# Patient Record
Sex: Female | Born: 1935
Health system: Southern US, Community
[De-identification: ages and names within clinical notes are randomized; demographics above are authoritative.]

## PROBLEM LIST (undated history)

## (undated) DIAGNOSIS — C539 Malignant neoplasm of cervix uteri, unspecified: Secondary | ICD-10-CM

## (undated) DIAGNOSIS — I10 Essential (primary) hypertension: Secondary | ICD-10-CM

## (undated) DIAGNOSIS — S4292XA Fracture of left shoulder girdle, part unspecified, initial encounter for closed fracture: Secondary | ICD-10-CM

## (undated) DIAGNOSIS — H409 Unspecified glaucoma: Secondary | ICD-10-CM

## (undated) DIAGNOSIS — C50919 Malignant neoplasm of unspecified site of unspecified female breast: Secondary | ICD-10-CM

## (undated) DIAGNOSIS — A0472 Enterocolitis due to Clostridium difficile, not specified as recurrent: Secondary | ICD-10-CM

## (undated) DIAGNOSIS — E079 Disorder of thyroid, unspecified: Secondary | ICD-10-CM

## (undated) DIAGNOSIS — M359 Systemic involvement of connective tissue, unspecified: Secondary | ICD-10-CM

## (undated) DIAGNOSIS — M810 Age-related osteoporosis without current pathological fracture: Secondary | ICD-10-CM

## (undated) DIAGNOSIS — S42309A Unspecified fracture of shaft of humerus, unspecified arm, initial encounter for closed fracture: Secondary | ICD-10-CM

## (undated) DIAGNOSIS — K2270 Barrett's esophagus without dysplasia: Secondary | ICD-10-CM

## (undated) DIAGNOSIS — M899 Disorder of bone, unspecified: Secondary | ICD-10-CM

## (undated) DIAGNOSIS — N1831 Chronic kidney disease, stage 3a: Secondary | ICD-10-CM

## (undated) DIAGNOSIS — G709 Myoneural disorder, unspecified: Secondary | ICD-10-CM

## (undated) DIAGNOSIS — Z923 Personal history of irradiation: Secondary | ICD-10-CM

## (undated) DIAGNOSIS — I629 Nontraumatic intracranial hemorrhage, unspecified: Secondary | ICD-10-CM

## (undated) DIAGNOSIS — D62 Acute posthemorrhagic anemia: Secondary | ICD-10-CM

## (undated) DIAGNOSIS — R1310 Dysphagia, unspecified: Secondary | ICD-10-CM

## (undated) DIAGNOSIS — N179 Acute kidney failure, unspecified: Secondary | ICD-10-CM

## (undated) DIAGNOSIS — M8580 Other specified disorders of bone density and structure, unspecified site: Secondary | ICD-10-CM

## (undated) DIAGNOSIS — S52599A Other fractures of lower end of unspecified radius, initial encounter for closed fracture: Secondary | ICD-10-CM

## (undated) DIAGNOSIS — E039 Hypothyroidism, unspecified: Secondary | ICD-10-CM

## (undated) DIAGNOSIS — Z9221 Personal history of antineoplastic chemotherapy: Secondary | ICD-10-CM

## (undated) DIAGNOSIS — E785 Hyperlipidemia, unspecified: Secondary | ICD-10-CM

## (undated) DIAGNOSIS — J189 Pneumonia, unspecified organism: Secondary | ICD-10-CM

## (undated) HISTORY — DX: Hyperlipidemia, unspecified: E78.5

## (undated) HISTORY — DX: Acute kidney failure, unspecified: N17.9

## (undated) HISTORY — DX: Other specified disorders of bone density and structure, unspecified site: M85.80

## (undated) HISTORY — DX: Systemic involvement of connective tissue, unspecified: M35.9

## (undated) HISTORY — DX: Fracture of left shoulder girdle, part unspecified, initial encounter for closed fracture: S42.92XA

## (undated) HISTORY — DX: Chronic kidney disease, stage 3a: N18.31

## (undated) HISTORY — DX: Other fractures of lower end of unspecified radius, initial encounter for closed fracture: S52.599A

## (undated) HISTORY — DX: Essential (primary) hypertension: I10

## (undated) HISTORY — DX: Myoneural disorder, unspecified: G70.9

## (undated) HISTORY — DX: Nontraumatic intracranial hemorrhage, unspecified: I62.9

## (undated) HISTORY — DX: Barrett's esophagus without dysplasia: K22.70

## (undated) HISTORY — DX: Hypercalcemia: E83.52

## (undated) HISTORY — PX: APPENDECTOMY: SHX54

## (undated) HISTORY — DX: Age-related osteoporosis without current pathological fracture: M81.0

## (undated) HISTORY — DX: Acute posthemorrhagic anemia: D62

## (undated) HISTORY — PX: EYE SURGERY: SHX253

## (undated) HISTORY — DX: Disorder of thyroid, unspecified: E07.9

## (undated) HISTORY — DX: Disorder of bone, unspecified: M89.9

## (undated) HISTORY — DX: Malignant neoplasm of cervix uteri, unspecified: C53.9

## (undated) HISTORY — DX: Pneumonia, unspecified organism: J18.9

## (undated) HISTORY — DX: Unspecified fracture of shaft of humerus, unspecified arm, initial encounter for closed fracture: S42.309A

## (undated) HISTORY — DX: Unspecified glaucoma: H40.9

## (undated) HISTORY — PX: HAND SURGERY: SHX662

## (undated) HISTORY — DX: Dysphagia, unspecified: R13.10

## (undated) HISTORY — PX: OTHER SURGICAL HISTORY: SHX169

## (undated) HISTORY — DX: Malignant neoplasm of unspecified site of unspecified female breast: C50.919

---
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
## 1970-10-13 HISTORY — PX: ABDOMINAL HYSTERECTOMY: SHX81

## 1976-10-13 HISTORY — PX: OVARIAN CYST REMOVAL: SHX89

## 1998-08-16 ENCOUNTER — Other Ambulatory Visit: Admission: RE | Admit: 1998-08-16 | Discharge: 1998-08-16 | Payer: Self-pay | Admitting: Internal Medicine

## 2000-02-11 ENCOUNTER — Encounter: Admission: RE | Admit: 2000-02-11 | Discharge: 2000-02-11 | Payer: Self-pay | Admitting: Internal Medicine

## 2000-02-11 ENCOUNTER — Encounter: Payer: Self-pay | Admitting: Internal Medicine

## 2000-08-28 ENCOUNTER — Encounter: Payer: Self-pay | Admitting: Emergency Medicine

## 2000-08-28 ENCOUNTER — Encounter: Admission: RE | Admit: 2000-08-28 | Discharge: 2000-08-28 | Payer: Self-pay | Admitting: Emergency Medicine

## 2000-11-20 ENCOUNTER — Encounter (INDEPENDENT_AMBULATORY_CARE_PROVIDER_SITE_OTHER): Payer: Self-pay

## 2000-11-20 ENCOUNTER — Ambulatory Visit (HOSPITAL_COMMUNITY): Admission: RE | Admit: 2000-11-20 | Discharge: 2000-11-20 | Payer: Self-pay | Admitting: *Deleted

## 2001-02-15 ENCOUNTER — Encounter: Admission: RE | Admit: 2001-02-15 | Discharge: 2001-02-15 | Payer: Self-pay | Admitting: Emergency Medicine

## 2001-02-15 ENCOUNTER — Encounter: Payer: Self-pay | Admitting: Emergency Medicine

## 2002-02-17 ENCOUNTER — Encounter: Admission: RE | Admit: 2002-02-17 | Discharge: 2002-02-17 | Payer: Self-pay | Admitting: Emergency Medicine

## 2002-02-17 ENCOUNTER — Encounter: Payer: Self-pay | Admitting: Emergency Medicine

## 2003-01-16 ENCOUNTER — Encounter (INDEPENDENT_AMBULATORY_CARE_PROVIDER_SITE_OTHER): Payer: Self-pay | Admitting: *Deleted

## 2003-01-16 ENCOUNTER — Ambulatory Visit (HOSPITAL_COMMUNITY): Admission: RE | Admit: 2003-01-16 | Discharge: 2003-01-16 | Payer: Self-pay | Admitting: *Deleted

## 2003-02-21 ENCOUNTER — Encounter: Admission: RE | Admit: 2003-02-21 | Discharge: 2003-02-21 | Payer: Self-pay | Admitting: Emergency Medicine

## 2003-02-21 ENCOUNTER — Encounter: Payer: Self-pay | Admitting: Emergency Medicine

## 2004-05-29 ENCOUNTER — Encounter: Admission: RE | Admit: 2004-05-29 | Discharge: 2004-05-29 | Payer: Self-pay | Admitting: Emergency Medicine

## 2005-06-04 ENCOUNTER — Encounter: Admission: RE | Admit: 2005-06-04 | Discharge: 2005-06-04 | Payer: Self-pay | Admitting: Emergency Medicine

## 2006-06-08 ENCOUNTER — Encounter: Admission: RE | Admit: 2006-06-08 | Discharge: 2006-06-08 | Payer: Self-pay | Admitting: Emergency Medicine

## 2007-04-21 ENCOUNTER — Ambulatory Visit: Payer: Self-pay | Admitting: Vascular Surgery

## 2007-06-10 ENCOUNTER — Encounter: Admission: RE | Admit: 2007-06-10 | Discharge: 2007-06-10 | Payer: Self-pay | Admitting: Emergency Medicine

## 2008-06-20 ENCOUNTER — Encounter: Admission: RE | Admit: 2008-06-20 | Discharge: 2008-06-20 | Payer: Self-pay | Admitting: Emergency Medicine

## 2008-07-04 ENCOUNTER — Encounter (INDEPENDENT_AMBULATORY_CARE_PROVIDER_SITE_OTHER): Payer: Self-pay | Admitting: Gastroenterology

## 2008-07-04 ENCOUNTER — Ambulatory Visit (HOSPITAL_COMMUNITY): Admission: RE | Admit: 2008-07-04 | Discharge: 2008-07-04 | Payer: Self-pay | Admitting: Gastroenterology

## 2008-08-10 ENCOUNTER — Encounter: Admission: RE | Admit: 2008-08-10 | Discharge: 2008-08-10 | Payer: Self-pay | Admitting: Emergency Medicine

## 2008-10-13 DIAGNOSIS — C50919 Malignant neoplasm of unspecified site of unspecified female breast: Secondary | ICD-10-CM

## 2008-10-13 HISTORY — PX: BREAST LUMPECTOMY: SHX2

## 2008-10-13 HISTORY — DX: Malignant neoplasm of unspecified site of unspecified female breast: C50.919

## 2008-12-10 ENCOUNTER — Emergency Department (HOSPITAL_BASED_OUTPATIENT_CLINIC_OR_DEPARTMENT_OTHER): Admission: EM | Admit: 2008-12-10 | Discharge: 2008-12-10 | Payer: Self-pay | Admitting: Emergency Medicine

## 2008-12-10 ENCOUNTER — Ambulatory Visit: Payer: Self-pay | Admitting: Diagnostic Radiology

## 2009-01-15 ENCOUNTER — Ambulatory Visit (HOSPITAL_COMMUNITY): Admission: RE | Admit: 2009-01-15 | Discharge: 2009-01-15 | Payer: Self-pay | Admitting: Gastroenterology

## 2009-03-05 ENCOUNTER — Encounter: Admission: RE | Admit: 2009-03-05 | Discharge: 2009-06-03 | Payer: Self-pay | Admitting: Student

## 2009-05-07 ENCOUNTER — Encounter: Admission: RE | Admit: 2009-05-07 | Discharge: 2009-05-07 | Payer: Self-pay | Admitting: Emergency Medicine

## 2009-07-19 ENCOUNTER — Encounter: Admission: RE | Admit: 2009-07-19 | Discharge: 2009-07-19 | Payer: Self-pay | Admitting: Emergency Medicine

## 2009-07-27 ENCOUNTER — Encounter: Admission: RE | Admit: 2009-07-27 | Discharge: 2009-07-27 | Payer: Self-pay | Admitting: Emergency Medicine

## 2009-08-07 ENCOUNTER — Encounter: Admission: RE | Admit: 2009-08-07 | Discharge: 2009-08-07 | Payer: Self-pay | Admitting: Emergency Medicine

## 2009-08-08 ENCOUNTER — Encounter: Admission: RE | Admit: 2009-08-08 | Discharge: 2009-08-08 | Payer: Self-pay | Admitting: General Surgery

## 2009-08-10 ENCOUNTER — Ambulatory Visit: Payer: Self-pay | Admitting: Oncology

## 2009-08-15 LAB — CBC WITH DIFFERENTIAL/PLATELET
BASO%: 0.2 % (ref 0.0–2.0)
Basophils Absolute: 0 10*3/uL (ref 0.0–0.1)
EOS%: 2.2 % (ref 0.0–7.0)
Eosinophils Absolute: 0.2 10*3/uL (ref 0.0–0.5)
HCT: 37.2 % (ref 34.8–46.6)
HGB: 12.1 g/dL (ref 11.6–15.9)
LYMPH%: 21.5 % (ref 14.0–49.7)
MCH: 29.2 pg (ref 25.1–34.0)
MCHC: 32.5 g/dL (ref 31.5–36.0)
MCV: 89.8 fL (ref 79.5–101.0)
MONO#: 0.5 10*3/uL (ref 0.1–0.9)
MONO%: 6.6 % (ref 0.0–14.0)
NEUT#: 5.6 10*3/uL (ref 1.5–6.5)
NEUT%: 69.5 % (ref 38.4–76.8)
Platelets: 254 10*3/uL (ref 145–400)
RBC: 4.15 10*6/uL (ref 3.70–5.45)
RDW: 16 % — ABNORMAL HIGH (ref 11.2–14.5)
WBC: 8.1 10*3/uL (ref 3.9–10.3)
lymph#: 1.7 10*3/uL (ref 0.9–3.3)

## 2009-08-15 LAB — COMPREHENSIVE METABOLIC PANEL
ALT: 20 U/L (ref 0–35)
AST: 23 U/L (ref 0–37)
Albumin: 4.1 g/dL (ref 3.5–5.2)
Alkaline Phosphatase: 111 U/L (ref 39–117)
BUN: 21 mg/dL (ref 6–23)
CO2: 28 mEq/L (ref 19–32)
Calcium: 10.3 mg/dL (ref 8.4–10.5)
Chloride: 103 mEq/L (ref 96–112)
Creatinine, Ser: 0.95 mg/dL (ref 0.40–1.20)
Glucose, Bld: 89 mg/dL (ref 70–99)
Potassium: 5 mEq/L (ref 3.5–5.3)
Sodium: 141 mEq/L (ref 135–145)
Total Bilirubin: 0.4 mg/dL (ref 0.3–1.2)
Total Protein: 7 g/dL (ref 6.0–8.3)

## 2009-08-15 LAB — CANCER ANTIGEN 27.29: CA 27.29: 28 U/mL (ref 0–39)

## 2009-08-20 ENCOUNTER — Ambulatory Visit: Admission: RE | Admit: 2009-08-20 | Discharge: 2009-10-12 | Payer: Self-pay | Admitting: Radiation Oncology

## 2009-08-27 ENCOUNTER — Encounter: Admission: RE | Admit: 2009-08-27 | Discharge: 2009-08-27 | Payer: Self-pay | Admitting: General Surgery

## 2009-08-27 ENCOUNTER — Ambulatory Visit (HOSPITAL_BASED_OUTPATIENT_CLINIC_OR_DEPARTMENT_OTHER): Admission: RE | Admit: 2009-08-27 | Discharge: 2009-08-27 | Payer: Self-pay | Admitting: General Surgery

## 2009-08-27 ENCOUNTER — Encounter (INDEPENDENT_AMBULATORY_CARE_PROVIDER_SITE_OTHER): Payer: Self-pay | Admitting: General Surgery

## 2009-08-30 ENCOUNTER — Encounter: Admission: RE | Admit: 2009-08-30 | Discharge: 2009-09-04 | Payer: Self-pay | Admitting: Radiation Oncology

## 2009-10-15 ENCOUNTER — Ambulatory Visit: Admission: RE | Admit: 2009-10-15 | Discharge: 2009-11-07 | Payer: Self-pay | Admitting: Radiation Oncology

## 2009-11-13 ENCOUNTER — Ambulatory Visit (HOSPITAL_COMMUNITY): Admission: RE | Admit: 2009-11-13 | Discharge: 2009-11-13 | Payer: Self-pay | Admitting: Oncology

## 2009-11-19 ENCOUNTER — Ambulatory Visit (HOSPITAL_COMMUNITY): Admission: RE | Admit: 2009-11-19 | Discharge: 2009-11-19 | Payer: Self-pay | Admitting: Oncology

## 2009-11-21 ENCOUNTER — Ambulatory Visit: Payer: Self-pay | Admitting: Oncology

## 2009-11-21 LAB — COMPREHENSIVE METABOLIC PANEL
ALT: 14 U/L (ref 0–35)
AST: 23 U/L (ref 0–37)
Albumin: 3.7 g/dL (ref 3.5–5.2)
Alkaline Phosphatase: 68 U/L (ref 39–117)
BUN: 17 mg/dL (ref 6–23)
CO2: 29 mEq/L (ref 19–32)
Calcium: 9.6 mg/dL (ref 8.4–10.5)
Chloride: 109 mEq/L (ref 96–112)
Creatinine, Ser: 0.93 mg/dL (ref 0.40–1.20)
Glucose, Bld: 97 mg/dL (ref 70–99)
Potassium: 3.8 mEq/L (ref 3.5–5.3)
Sodium: 143 mEq/L (ref 135–145)
Total Bilirubin: 0.6 mg/dL (ref 0.3–1.2)
Total Protein: 6.7 g/dL (ref 6.0–8.3)

## 2009-11-21 LAB — CBC WITH DIFFERENTIAL/PLATELET
BASO%: 0.5 % (ref 0.0–2.0)
Basophils Absolute: 0 10*3/uL (ref 0.0–0.1)
EOS%: 2.8 % (ref 0.0–7.0)
Eosinophils Absolute: 0.2 10*3/uL (ref 0.0–0.5)
HCT: 37.8 % (ref 34.8–46.6)
HGB: 12.6 g/dL (ref 11.6–15.9)
LYMPH%: 14.3 % (ref 14.0–49.7)
MCH: 30.1 pg (ref 25.1–34.0)
MCHC: 33.3 g/dL (ref 31.5–36.0)
MCV: 90.5 fL (ref 79.5–101.0)
MONO#: 0.6 10*3/uL (ref 0.1–0.9)
MONO%: 8.4 % (ref 0.0–14.0)
NEUT#: 5.2 10*3/uL (ref 1.5–6.5)
NEUT%: 74 % (ref 38.4–76.8)
Platelets: 213 10*3/uL (ref 145–400)
RBC: 4.18 10*6/uL (ref 3.70–5.45)
RDW: 14.9 % — ABNORMAL HIGH (ref 11.2–14.5)
WBC: 7 10*3/uL (ref 3.9–10.3)
lymph#: 1 10*3/uL (ref 0.9–3.3)

## 2009-11-21 LAB — CANCER ANTIGEN 27.29: CA 27.29: 19 U/mL (ref 0–39)

## 2009-11-21 LAB — LACTATE DEHYDROGENASE: LDH: 145 U/L (ref 94–250)

## 2009-11-29 ENCOUNTER — Ambulatory Visit (HOSPITAL_COMMUNITY): Admission: RE | Admit: 2009-11-29 | Discharge: 2009-11-29 | Payer: Self-pay | Admitting: Oncology

## 2010-01-29 ENCOUNTER — Ambulatory Visit: Payer: Self-pay | Admitting: Oncology

## 2010-01-31 LAB — COMPREHENSIVE METABOLIC PANEL
ALT: 8 U/L (ref 0–35)
AST: 19 U/L (ref 0–37)
Albumin: 3.9 g/dL (ref 3.5–5.2)
Alkaline Phosphatase: 66 U/L (ref 39–117)
BUN: 21 mg/dL (ref 6–23)
CO2: 27 mEq/L (ref 19–32)
Calcium: 9.8 mg/dL (ref 8.4–10.5)
Chloride: 104 mEq/L (ref 96–112)
Creatinine, Ser: 1.15 mg/dL (ref 0.40–1.20)
Glucose, Bld: 86 mg/dL (ref 70–99)
Potassium: 4.4 mEq/L (ref 3.5–5.3)
Sodium: 141 mEq/L (ref 135–145)
Total Bilirubin: 0.4 mg/dL (ref 0.3–1.2)
Total Protein: 6.4 g/dL (ref 6.0–8.3)

## 2010-01-31 LAB — CBC WITH DIFFERENTIAL/PLATELET
BASO%: 0.4 % (ref 0.0–2.0)
Basophils Absolute: 0 10*3/uL (ref 0.0–0.1)
EOS%: 2.1 % (ref 0.0–7.0)
Eosinophils Absolute: 0.1 10*3/uL (ref 0.0–0.5)
HCT: 34.8 % (ref 34.8–46.6)
HGB: 11.7 g/dL (ref 11.6–15.9)
LYMPH%: 16.7 % (ref 14.0–49.7)
MCH: 30.5 pg (ref 25.1–34.0)
MCHC: 33.7 g/dL (ref 31.5–36.0)
MCV: 90.4 fL (ref 79.5–101.0)
MONO#: 0.5 10*3/uL (ref 0.1–0.9)
MONO%: 8.2 % (ref 0.0–14.0)
NEUT#: 4.6 10*3/uL (ref 1.5–6.5)
NEUT%: 72.6 % (ref 38.4–76.8)
Platelets: 206 10*3/uL (ref 145–400)
RBC: 3.84 10*6/uL (ref 3.70–5.45)
RDW: 15.2 % — ABNORMAL HIGH (ref 11.2–14.5)
WBC: 6.4 10*3/uL (ref 3.9–10.3)
lymph#: 1.1 10*3/uL (ref 0.9–3.3)

## 2010-02-01 ENCOUNTER — Ambulatory Visit (HOSPITAL_COMMUNITY): Admission: RE | Admit: 2010-02-01 | Discharge: 2010-02-01 | Payer: Self-pay | Admitting: Oncology

## 2010-02-13 ENCOUNTER — Ambulatory Visit (HOSPITAL_COMMUNITY): Admission: RE | Admit: 2010-02-13 | Discharge: 2010-02-13 | Payer: Self-pay | Admitting: Oncology

## 2010-03-29 ENCOUNTER — Ambulatory Visit: Payer: Self-pay | Admitting: Oncology

## 2010-03-29 LAB — CBC WITH DIFFERENTIAL/PLATELET
BASO%: 0.3 % (ref 0.0–2.0)
Basophils Absolute: 0 10*3/uL (ref 0.0–0.1)
EOS%: 1.4 % (ref 0.0–7.0)
Eosinophils Absolute: 0.1 10*3/uL (ref 0.0–0.5)
HCT: 32.6 % — ABNORMAL LOW (ref 34.8–46.6)
HGB: 11.1 g/dL — ABNORMAL LOW (ref 11.6–15.9)
LYMPH%: 18.1 % (ref 14.0–49.7)
MCH: 30.2 pg (ref 25.1–34.0)
MCHC: 34.1 g/dL (ref 31.5–36.0)
MCV: 88.7 fL (ref 79.5–101.0)
MONO#: 0.4 10*3/uL (ref 0.1–0.9)
MONO%: 6.8 % (ref 0.0–14.0)
NEUT#: 4.4 10*3/uL (ref 1.5–6.5)
NEUT%: 73.4 % (ref 38.4–76.8)
Platelets: 197 10*3/uL (ref 145–400)
RBC: 3.68 10*6/uL — ABNORMAL LOW (ref 3.70–5.45)
RDW: 15.3 % — ABNORMAL HIGH (ref 11.2–14.5)
WBC: 5.9 10*3/uL (ref 3.9–10.3)
lymph#: 1.1 10*3/uL (ref 0.9–3.3)

## 2010-03-29 LAB — COMPREHENSIVE METABOLIC PANEL
ALT: 10 U/L (ref 0–35)
AST: 18 U/L (ref 0–37)
Albumin: 3.9 g/dL (ref 3.5–5.2)
Alkaline Phosphatase: 66 U/L (ref 39–117)
BUN: 24 mg/dL — ABNORMAL HIGH (ref 6–23)
CO2: 24 mEq/L (ref 19–32)
Calcium: 9.5 mg/dL (ref 8.4–10.5)
Chloride: 107 mEq/L (ref 96–112)
Creatinine, Ser: 1.06 mg/dL (ref 0.40–1.20)
Glucose, Bld: 95 mg/dL (ref 70–99)
Potassium: 4.3 mEq/L (ref 3.5–5.3)
Sodium: 142 mEq/L (ref 135–145)
Total Bilirubin: 0.3 mg/dL (ref 0.3–1.2)
Total Protein: 6.4 g/dL (ref 6.0–8.3)

## 2010-04-01 ENCOUNTER — Encounter: Admission: RE | Admit: 2010-04-01 | Discharge: 2010-04-01 | Payer: Self-pay | Admitting: Oncology

## 2010-06-03 ENCOUNTER — Ambulatory Visit: Payer: Self-pay | Admitting: Oncology

## 2010-06-05 LAB — COMPREHENSIVE METABOLIC PANEL
ALT: 11 U/L (ref 0–35)
AST: 22 U/L (ref 0–37)
Albumin: 4.1 g/dL (ref 3.5–5.2)
Alkaline Phosphatase: 72 U/L (ref 39–117)
BUN: 18 mg/dL (ref 6–23)
CO2: 23 mEq/L (ref 19–32)
Calcium: 9.6 mg/dL (ref 8.4–10.5)
Chloride: 106 mEq/L (ref 96–112)
Creatinine, Ser: 1.03 mg/dL (ref 0.40–1.20)
Glucose, Bld: 98 mg/dL (ref 70–99)
Potassium: 4.6 mEq/L (ref 3.5–5.3)
Sodium: 140 mEq/L (ref 135–145)
Total Bilirubin: 0.4 mg/dL (ref 0.3–1.2)
Total Protein: 6.6 g/dL (ref 6.0–8.3)

## 2010-06-05 LAB — URINALYSIS, MICROSCOPIC - CHCC
Bilirubin (Urine): NEGATIVE
Blood: NEGATIVE
Glucose: NEGATIVE g/dL
Ketones: NEGATIVE mg/dL
Leukocyte Esterase: NEGATIVE
Nitrite: NEGATIVE
Protein: NEGATIVE mg/dL
Specific Gravity, Urine: 1.01 (ref 1.003–1.035)
WBC, UA: NEGATIVE (ref 0–2)
pH: 5 (ref 4.6–8.0)

## 2010-06-05 LAB — CBC WITH DIFFERENTIAL/PLATELET
BASO%: 0.4 % (ref 0.0–2.0)
Basophils Absolute: 0 10*3/uL (ref 0.0–0.1)
EOS%: 6 % (ref 0.0–7.0)
Eosinophils Absolute: 0.3 10*3/uL (ref 0.0–0.5)
HCT: 37.9 % (ref 34.8–46.6)
HGB: 12.7 g/dL (ref 11.6–15.9)
LYMPH%: 16.9 % (ref 14.0–49.7)
MCH: 29.9 pg (ref 25.1–34.0)
MCHC: 33.6 g/dL (ref 31.5–36.0)
MCV: 89.1 fL (ref 79.5–101.0)
MONO#: 0.4 10*3/uL (ref 0.1–0.9)
MONO%: 7.6 % (ref 0.0–14.0)
NEUT#: 4 10*3/uL (ref 1.5–6.5)
NEUT%: 69.1 % (ref 38.4–76.8)
Platelets: 215 10*3/uL (ref 145–400)
RBC: 4.26 10*6/uL (ref 3.70–5.45)
RDW: 16.3 % — ABNORMAL HIGH (ref 11.2–14.5)
WBC: 5.8 10*3/uL (ref 3.9–10.3)
lymph#: 1 10*3/uL (ref 0.9–3.3)

## 2010-06-06 LAB — URINE CULTURE

## 2010-07-23 ENCOUNTER — Encounter: Admission: RE | Admit: 2010-07-23 | Discharge: 2010-07-23 | Payer: Self-pay | Admitting: Family Medicine

## 2010-08-02 ENCOUNTER — Encounter: Admission: RE | Admit: 2010-08-02 | Discharge: 2010-08-02 | Payer: Self-pay | Admitting: Oncology

## 2010-09-23 ENCOUNTER — Ambulatory Visit: Payer: Self-pay | Admitting: Oncology

## 2010-09-25 LAB — CBC WITH DIFFERENTIAL/PLATELET
BASO%: 0.3 % (ref 0.0–2.0)
Basophils Absolute: 0 10*3/uL (ref 0.0–0.1)
EOS%: 1.9 % (ref 0.0–7.0)
Eosinophils Absolute: 0.2 10*3/uL (ref 0.0–0.5)
HCT: 36.5 % (ref 34.8–46.6)
HGB: 12.2 g/dL (ref 11.6–15.9)
LYMPH%: 18.9 % (ref 14.0–49.7)
MCH: 29.8 pg (ref 25.1–34.0)
MCHC: 33.3 g/dL (ref 31.5–36.0)
MCV: 89.3 fL (ref 79.5–101.0)
MONO#: 0.8 10*3/uL (ref 0.1–0.9)
MONO%: 9.4 % (ref 0.0–14.0)
NEUT#: 5.8 10*3/uL (ref 1.5–6.5)
NEUT%: 69.5 % (ref 38.4–76.8)
Platelets: 253 10*3/uL (ref 145–400)
RBC: 4.08 10*6/uL (ref 3.70–5.45)
RDW: 16 % — ABNORMAL HIGH (ref 11.2–14.5)
WBC: 8.3 10*3/uL (ref 3.9–10.3)
lymph#: 1.6 10*3/uL (ref 0.9–3.3)

## 2010-09-25 LAB — COMPREHENSIVE METABOLIC PANEL
ALT: 10 U/L (ref 0–35)
AST: 15 U/L (ref 0–37)
Albumin: 3.8 g/dL (ref 3.5–5.2)
Alkaline Phosphatase: 63 U/L (ref 39–117)
BUN: 25 mg/dL — ABNORMAL HIGH (ref 6–23)
CO2: 30 mEq/L (ref 19–32)
Calcium: 10 mg/dL (ref 8.4–10.5)
Chloride: 102 mEq/L (ref 96–112)
Creatinine, Ser: 0.99 mg/dL (ref 0.40–1.20)
Glucose, Bld: 77 mg/dL (ref 70–99)
Potassium: 4.2 mEq/L (ref 3.5–5.3)
Sodium: 138 mEq/L (ref 135–145)
Total Bilirubin: 0.3 mg/dL (ref 0.3–1.2)
Total Protein: 6.3 g/dL (ref 6.0–8.3)

## 2010-09-25 LAB — LACTATE DEHYDROGENASE: LDH: 145 U/L (ref 94–250)

## 2010-11-03 ENCOUNTER — Encounter: Payer: Self-pay | Admitting: Oncology

## 2010-11-04 ENCOUNTER — Encounter: Payer: Self-pay | Admitting: Emergency Medicine

## 2010-12-12 ENCOUNTER — Other Ambulatory Visit: Payer: Self-pay | Admitting: Emergency Medicine

## 2010-12-12 DIAGNOSIS — R252 Cramp and spasm: Secondary | ICD-10-CM

## 2010-12-18 ENCOUNTER — Ambulatory Visit
Admission: RE | Admit: 2010-12-18 | Discharge: 2010-12-18 | Disposition: A | Discharge status: Home / Self Care | Payer: Medicare Other | Source: Ambulatory Visit | Attending: Emergency Medicine | Admitting: Emergency Medicine

## 2010-12-18 DIAGNOSIS — R252 Cramp and spasm: Secondary | ICD-10-CM

## 2010-12-27 ENCOUNTER — Other Ambulatory Visit: Payer: Self-pay | Admitting: Gastroenterology

## 2011-01-01 LAB — PROTIME-INR
INR: 1.06 (ref 0.00–1.49)
Prothrombin Time: 13.7 seconds (ref 11.6–15.2)

## 2011-01-01 LAB — CBC
HCT: 36.5 % (ref 36.0–46.0)
Hemoglobin: 12.6 g/dL (ref 12.0–15.0)
MCHC: 34.5 g/dL (ref 30.0–36.0)
MCV: 89.6 fL (ref 78.0–100.0)
Platelets: 174 10*3/uL (ref 150–400)
RBC: 4.07 MIL/uL (ref 3.87–5.11)
RDW: 14.6 % (ref 11.5–15.5)
WBC: 5.7 10*3/uL (ref 4.0–10.5)

## 2011-01-01 LAB — APTT: aPTT: 32 seconds (ref 24–37)

## 2011-01-15 LAB — COMPREHENSIVE METABOLIC PANEL
ALT: 18 U/L (ref 0–35)
AST: 24 U/L (ref 0–37)
Albumin: 3.8 g/dL (ref 3.5–5.2)
Alkaline Phosphatase: 85 U/L (ref 39–117)
BUN: 18 mg/dL (ref 6–23)
CO2: 28 mEq/L (ref 19–32)
Calcium: 9.5 mg/dL (ref 8.4–10.5)
Chloride: 103 mEq/L (ref 96–112)
Creatinine, Ser: 1.06 mg/dL (ref 0.4–1.2)
GFR calc Af Amer: >60 mL/min (ref >60)
GFR calc non Af Amer: 50 mL/min — ABNORMAL LOW (ref >60)
Glucose, Bld: 150 mg/dL — ABNORMAL HIGH (ref 70–99)
Potassium: 4.9 mEq/L (ref 3.5–5.1)
Sodium: 135 mEq/L (ref 135–145)
Total Bilirubin: 0.5 mg/dL (ref 0.3–1.2)
Total Protein: 7.1 g/dL (ref 6.0–8.3)

## 2011-01-15 LAB — CBC
HCT: 35.8 % — ABNORMAL LOW (ref 36.0–46.0)
Hemoglobin: 12 g/dL (ref 12.0–15.0)
MCHC: 33.6 g/dL (ref 30.0–36.0)
MCV: 90.4 fL (ref 78.0–100.0)
Platelets: 227 10*3/uL (ref 150–400)
RBC: 3.96 MIL/uL (ref 3.87–5.11)
RDW: 16.7 % — ABNORMAL HIGH (ref 11.5–15.5)
WBC: 7.4 10*3/uL (ref 4.0–10.5)

## 2011-01-15 LAB — DIFFERENTIAL
Basophils Absolute: 0 10*3/uL (ref 0.0–0.1)
Basophils Relative: 0 % (ref 0–1)
Eosinophils Absolute: 0.1 10*3/uL (ref 0.0–0.7)
Eosinophils Relative: 2 % (ref 0–5)
Lymphocytes Relative: 26 % (ref 12–46)
Lymphs Abs: 1.9 10*3/uL (ref 0.7–4.0)
Monocytes Absolute: 0.4 10*3/uL (ref 0.1–1.0)
Monocytes Relative: 6 % (ref 3–12)
Neutro Abs: 4.9 10*3/uL (ref 1.7–7.7)
Neutrophils Relative %: 66 % (ref 43–77)

## 2011-01-15 LAB — LACTATE DEHYDROGENASE: LDH: 186 U/L (ref 94–250)

## 2011-01-15 LAB — CANCER ANTIGEN 27.29: CA 27.29: 27 U/mL (ref 0–39)

## 2011-01-22 ENCOUNTER — Encounter (HOSPITAL_BASED_OUTPATIENT_CLINIC_OR_DEPARTMENT_OTHER): Payer: Medicare Other | Admitting: Oncology

## 2011-01-22 ENCOUNTER — Other Ambulatory Visit: Payer: Self-pay | Admitting: Oncology

## 2011-01-22 DIAGNOSIS — C50419 Malignant neoplasm of upper-outer quadrant of unspecified female breast: Secondary | ICD-10-CM

## 2011-01-22 DIAGNOSIS — M899 Disorder of bone, unspecified: Secondary | ICD-10-CM

## 2011-01-22 DIAGNOSIS — C50919 Malignant neoplasm of unspecified site of unspecified female breast: Secondary | ICD-10-CM

## 2011-01-22 LAB — CBC WITH DIFFERENTIAL/PLATELET
BASO%: 0.4 % (ref 0.0–2.0)
Basophils Absolute: 0 10*3/uL (ref 0.0–0.1)
EOS%: 1.9 % (ref 0.0–7.0)
Eosinophils Absolute: 0.1 10*3/uL (ref 0.0–0.5)
HCT: 35.9 % (ref 34.8–46.6)
HGB: 12.1 g/dL (ref 11.6–15.9)
LYMPH%: 21.6 % (ref 14.0–49.7)
MCH: 29.4 pg (ref 25.1–34.0)
MCHC: 33.6 g/dL (ref 31.5–36.0)
MCV: 87.5 fL (ref 79.5–101.0)
MONO#: 0.5 10*3/uL (ref 0.1–0.9)
MONO%: 8 % (ref 0.0–14.0)
NEUT#: 4 10*3/uL (ref 1.5–6.5)
NEUT%: 68.1 % (ref 38.4–76.8)
Platelets: 212 10*3/uL (ref 145–400)
RBC: 4.11 10*6/uL (ref 3.70–5.45)
RDW: 15.9 % — ABNORMAL HIGH (ref 11.2–14.5)
WBC: 5.9 10*3/uL (ref 3.9–10.3)
lymph#: 1.3 10*3/uL (ref 0.9–3.3)

## 2011-01-22 LAB — COMPREHENSIVE METABOLIC PANEL
ALT: 10 U/L (ref 0–35)
AST: 21 U/L (ref 0–37)
Albumin: 4.1 g/dL (ref 3.5–5.2)
Alkaline Phosphatase: 74 U/L (ref 39–117)
BUN: 20 mg/dL (ref 6–23)
CO2: 26 mEq/L (ref 19–32)
Calcium: 9.7 mg/dL (ref 8.4–10.5)
Chloride: 106 mEq/L (ref 96–112)
Creatinine, Ser: 0.98 mg/dL (ref 0.40–1.20)
Glucose, Bld: 93 mg/dL (ref 70–99)
Potassium: 4.4 mEq/L (ref 3.5–5.3)
Sodium: 140 mEq/L (ref 135–145)
Total Bilirubin: 0.4 mg/dL (ref 0.3–1.2)
Total Protein: 6.5 g/dL (ref 6.0–8.3)

## 2011-01-22 LAB — LACTATE DEHYDROGENASE: LDH: 169 U/L (ref 94–250)

## 2011-01-25 ENCOUNTER — Emergency Department: Payer: Self-pay | Admitting: Internal Medicine

## 2011-02-25 NOTE — Op Note (Signed)
NAMEFIONNA, [name]                 ACCOUNT NO.:  [id number]   MEDICAL RECORD NO.:  [id number]          PATIENT TYPE:  AMB   LOCATION:  ENDO                         FACILITY:  [facility]   PHYSICIAN:  Shirley Friar, MDDATE OF BIRTH:  1931-09-26   DATE OF PROCEDURE:  07/04/2008  DATE OF DISCHARGE:                               OPERATIVE REPORT   PROCEDURE:  Colonoscopy.   REASON FOR COLONOSCOPY:  Screening.   MEDICATIONS:  Fentanyl 50 mcg IV, Versed 4 mg IV.   FINDINGS:  Rectal exam was normal.  A pediatric Pentax colonoscope was  inserted into an adequately prepped colon and advanced to cecum where  the ileocecal valve and appendiceal orifice were identified.  On careful  withdrawal of the colonoscope a 1 cm pedunculated polyp was seen in the  proximal ascending colon that was removed with snare cautery.  Retrieval  of this polyp resulted in the polyp breaking up into pieces on  retrieval.  No immediate complications were seen at the polypectomy  site.  On further withdrawal the colonoscope, no other mucosal  abnormalities were seen.  Retroflexion revealed small internal  hemorrhoids.   ASSESSMENT:  1. Proximal ascending colon polyp removed with snare cautery.  2. Small internal hemorrhoids.   PLAN:  1. Avoid aspirin products for 2 weeks.  2. Follow-up on path.      Shirley Friar, MD  Electronically Signed     VCS/MEDQ  D:  07/04/2008  T:  07/05/2008  Job:  [id number]   cc:   Brett Canales A. Cleta Alberts, M.D.  Fax: [phone]

## 2011-02-25 NOTE — Procedures (Signed)
CAROTID DUPLEX EXAM   INDICATION:  Left arm and face paresthesia.   HISTORY:  Diabetes:  No  Cardiac:  No  Hypertension:  No  Smoking:  No  Previous Surgery:  No  CV History:  Episode of left arm and facial numbness for approximately 1  month.  The tingling sensation is almost constant.  Amaurosis Fugax No, Paresthesias Yes, Hemiparesis No.                                        RIGHT             LEFT  Brachial systolic pressure:         156               160  Brachial Doppler waveforms:         Triphasic         Triphasic  Vertebral direction of flow:        Antegrade         Antegrade  DUPLEX VELOCITIES (cm/sec)  CCA peak systolic                   57                61  ECA peak systolic                   61                65  ICA peak systolic                   70                76  ICA end diastolic                   22                26  PLAQUE MORPHOLOGY:                  Calcified         Mixed  PLAQUE AMOUNT:                      Mild to moderate  Mild  PLAQUE LOCATION:                    Proximal ICA      Proximal ICA   IMPRESSION:  A 20-39% internal carotid artery stenoses bilaterally.   .   ___________________________________________  Di Kindle Edilia Bo, M.D.   MC/MEDQ  D:  04/22/2007  T:  04/22/2007  Job:  [id number]

## 2011-03-06 ENCOUNTER — Encounter (INDEPENDENT_AMBULATORY_CARE_PROVIDER_SITE_OTHER): Payer: Self-pay | Admitting: General Surgery

## 2011-06-11 ENCOUNTER — Other Ambulatory Visit: Payer: Self-pay | Admitting: Oncology

## 2011-06-11 ENCOUNTER — Encounter (HOSPITAL_BASED_OUTPATIENT_CLINIC_OR_DEPARTMENT_OTHER): Payer: MEDICARE | Admitting: Oncology

## 2011-06-11 DIAGNOSIS — C50919 Malignant neoplasm of unspecified site of unspecified female breast: Secondary | ICD-10-CM

## 2011-06-11 DIAGNOSIS — M949 Disorder of cartilage, unspecified: Secondary | ICD-10-CM

## 2011-06-11 DIAGNOSIS — C50419 Malignant neoplasm of upper-outer quadrant of unspecified female breast: Secondary | ICD-10-CM

## 2011-06-11 DIAGNOSIS — M899 Disorder of bone, unspecified: Secondary | ICD-10-CM

## 2011-06-11 LAB — CBC WITH DIFFERENTIAL/PLATELET
BASO%: 0.3 % (ref 0.0–2.0)
Basophils Absolute: 0 10*3/uL (ref 0.0–0.1)
EOS%: 1.5 % (ref 0.0–7.0)
Eosinophils Absolute: 0.1 10*3/uL (ref 0.0–0.5)
HCT: 33.4 % — ABNORMAL LOW (ref 34.8–46.6)
HGB: 11.2 g/dL — ABNORMAL LOW (ref 11.6–15.9)
LYMPH%: 14.5 % (ref 14.0–49.7)
MCH: 29.2 pg (ref 25.1–34.0)
MCHC: 33.5 g/dL (ref 31.5–36.0)
MCV: 87.3 fL (ref 79.5–101.0)
MONO#: 0.6 10*3/uL (ref 0.1–0.9)
MONO%: 7.7 % (ref 0.0–14.0)
NEUT#: 5.8 10*3/uL (ref 1.5–6.5)
NEUT%: 76 % (ref 38.4–76.8)
Platelets: 206 10*3/uL (ref 145–400)
RBC: 3.83 10*6/uL (ref 3.70–5.45)
RDW: 16.4 % — ABNORMAL HIGH (ref 11.2–14.5)
WBC: 7.6 10*3/uL (ref 3.9–10.3)
lymph#: 1.1 10*3/uL (ref 0.9–3.3)

## 2011-06-11 LAB — COMPREHENSIVE METABOLIC PANEL
ALT: 9 U/L (ref 0–35)
AST: 17 U/L (ref 0–37)
Albumin: 3.9 g/dL (ref 3.5–5.2)
Alkaline Phosphatase: 74 U/L (ref 39–117)
BUN: 23 mg/dL (ref 6–23)
CO2: 25 mEq/L (ref 19–32)
Calcium: 9.4 mg/dL (ref 8.4–10.5)
Chloride: 103 mEq/L (ref 96–112)
Creatinine, Ser: 1.26 mg/dL — ABNORMAL HIGH (ref 0.50–1.10)
Glucose, Bld: 66 mg/dL — ABNORMAL LOW (ref 70–99)
Potassium: 4.7 mEq/L (ref 3.5–5.3)
Sodium: 139 mEq/L (ref 135–145)
Total Bilirubin: 0.3 mg/dL (ref 0.3–1.2)
Total Protein: 6.3 g/dL (ref 6.0–8.3)

## 2011-06-30 ENCOUNTER — Other Ambulatory Visit: Payer: Self-pay | Admitting: Oncology

## 2011-06-30 DIAGNOSIS — Z853 Personal history of malignant neoplasm of breast: Secondary | ICD-10-CM

## 2011-07-09 LAB — CBC AND DIFFERENTIAL
HCT: 38 % (ref 36–46)
Hemoglobin: 11.9 g/dL — AB (ref 12.0–16.0)
Platelets: 242 10*3/uL (ref 150–399)

## 2011-07-09 LAB — BASIC METABOLIC PANEL
Glucose: 47 mg/dL
Sodium: 140 mmol/L (ref 137–147)

## 2011-07-09 LAB — LIPID PANEL
Cholesterol: 171 mg/dL (ref 0–200)
HDL: 71 mg/dL — AB (ref 35–70)
LDL Cholesterol: 87 mg/dL
LDl/HDL Ratio: 2.4
Triglycerides: 65 mg/dL (ref 40–160)

## 2011-08-04 ENCOUNTER — Ambulatory Visit
Admission: RE | Admit: 2011-08-04 | Discharge: 2011-08-04 | Disposition: A | Discharge status: Home / Self Care | Payer: Medicare Other | Source: Ambulatory Visit | Attending: Oncology | Admitting: Oncology

## 2011-08-04 DIAGNOSIS — Z853 Personal history of malignant neoplasm of breast: Secondary | ICD-10-CM

## 2011-08-04 LAB — HM MAMMOGRAPHY

## 2011-08-19 ENCOUNTER — Other Ambulatory Visit: Payer: Self-pay | Admitting: Dermatology

## 2011-10-12 ENCOUNTER — Encounter: Payer: Self-pay | Admitting: Oncology

## 2011-10-12 DIAGNOSIS — C50919 Malignant neoplasm of unspecified site of unspecified female breast: Secondary | ICD-10-CM | POA: Insufficient documentation

## 2011-10-12 DIAGNOSIS — E785 Hyperlipidemia, unspecified: Secondary | ICD-10-CM | POA: Insufficient documentation

## 2011-10-16 ENCOUNTER — Encounter: Payer: Self-pay | Admitting: *Deleted

## 2011-10-18 ENCOUNTER — Ambulatory Visit (INDEPENDENT_AMBULATORY_CARE_PROVIDER_SITE_OTHER): Payer: Medicare Other

## 2011-10-18 DIAGNOSIS — R05 Cough: Secondary | ICD-10-CM

## 2011-10-18 DIAGNOSIS — R059 Cough, unspecified: Secondary | ICD-10-CM

## 2011-10-18 DIAGNOSIS — J029 Acute pharyngitis, unspecified: Secondary | ICD-10-CM

## 2011-10-20 ENCOUNTER — Other Ambulatory Visit: Payer: Self-pay | Admitting: Oncology

## 2011-10-20 ENCOUNTER — Telehealth: Payer: Self-pay | Admitting: Oncology

## 2011-10-20 ENCOUNTER — Ambulatory Visit (HOSPITAL_BASED_OUTPATIENT_CLINIC_OR_DEPARTMENT_OTHER): Payer: Medicare Other | Admitting: Oncology

## 2011-10-20 ENCOUNTER — Other Ambulatory Visit (HOSPITAL_BASED_OUTPATIENT_CLINIC_OR_DEPARTMENT_OTHER): Payer: Medicare Other | Admitting: Lab

## 2011-10-20 DIAGNOSIS — M8580 Other specified disorders of bone density and structure, unspecified site: Secondary | ICD-10-CM

## 2011-10-20 DIAGNOSIS — C50419 Malignant neoplasm of upper-outer quadrant of unspecified female breast: Secondary | ICD-10-CM

## 2011-10-20 DIAGNOSIS — C50919 Malignant neoplasm of unspecified site of unspecified female breast: Secondary | ICD-10-CM

## 2011-10-20 DIAGNOSIS — Z79811 Long term (current) use of aromatase inhibitors: Secondary | ICD-10-CM

## 2011-10-20 DIAGNOSIS — M899 Disorder of bone, unspecified: Secondary | ICD-10-CM

## 2011-10-20 DIAGNOSIS — M949 Disorder of cartilage, unspecified: Secondary | ICD-10-CM

## 2011-10-20 DIAGNOSIS — E785 Hyperlipidemia, unspecified: Secondary | ICD-10-CM

## 2011-10-20 DIAGNOSIS — R599 Enlarged lymph nodes, unspecified: Secondary | ICD-10-CM

## 2011-10-20 LAB — CBC WITH DIFFERENTIAL/PLATELET
BASO%: 0.2 % (ref 0.0–2.0)
Basophils Absolute: 0 10*3/uL (ref 0.0–0.1)
EOS%: 1.9 % (ref 0.0–7.0)
Eosinophils Absolute: 0.2 10*3/uL (ref 0.0–0.5)
HCT: 33.4 % — ABNORMAL LOW (ref 34.8–46.6)
HGB: 11.2 g/dL — ABNORMAL LOW (ref 11.6–15.9)
LYMPH%: 6.7 % — ABNORMAL LOW (ref 14.0–49.7)
MCH: 29.5 pg (ref 25.1–34.0)
MCHC: 33.7 g/dL (ref 31.5–36.0)
MCV: 87.7 fL (ref 79.5–101.0)
MONO#: 0.7 10*3/uL (ref 0.1–0.9)
MONO%: 7.6 % (ref 0.0–14.0)
NEUT#: 8.2 10*3/uL — ABNORMAL HIGH (ref 1.5–6.5)
NEUT%: 83.6 % — ABNORMAL HIGH (ref 38.4–76.8)
Platelets: 196 10*3/uL (ref 145–400)
RBC: 3.81 10*6/uL (ref 3.70–5.45)
RDW: 16.1 % — ABNORMAL HIGH (ref 11.2–14.5)
WBC: 9.8 10*3/uL (ref 3.9–10.3)
lymph#: 0.7 10*3/uL — ABNORMAL LOW (ref 0.9–3.3)

## 2011-10-20 LAB — COMPREHENSIVE METABOLIC PANEL
ALT: 13 U/L (ref 0–35)
AST: 27 U/L (ref 0–37)
Albumin: 3.6 g/dL (ref 3.5–5.2)
Alkaline Phosphatase: 78 U/L (ref 39–117)
BUN: 18 mg/dL (ref 6–23)
CO2: 22 mEq/L (ref 19–32)
Calcium: 8.8 mg/dL (ref 8.4–10.5)
Chloride: 101 mEq/L (ref 96–112)
Creatinine, Ser: 1.15 mg/dL — ABNORMAL HIGH (ref 0.50–1.10)
Glucose, Bld: 144 mg/dL — ABNORMAL HIGH (ref 70–99)
Potassium: 4.3 mEq/L (ref 3.5–5.3)
Sodium: 136 mEq/L (ref 135–145)
Total Bilirubin: 0.5 mg/dL (ref 0.3–1.2)
Total Protein: 6.1 g/dL (ref 6.0–8.3)

## 2011-10-20 NOTE — Progress Notes (Signed)
ATTENDING'S ATTESTATION:  I personally reviewed patient's chart, examined patient myself, formulated the treatment plan as stated by midlevel.  In addition, her next routine surveillance mammogram is due in 07/2012.  Her last bone density is 06/2008.  [facility]EXA [facility] to ensure that her bone density has not worsened with hormonal therapy for breast cancer.

## 2011-10-20 NOTE — Progress Notes (Signed)
[facility] OFFICE PROGRESS NOTE  CC: Maryln Gottron, M.D.  [place] Cleta Alberts, M.D.   DIAGNOSIS:  T1 N0 M0 lesion, measured 10 x 6 x 10 mm on mammogram, invasive lobular carcinoma, low grade, estrogen receptor 83%, progesterone receptor 63%, Ki-67 of 6%, HER2/neu by C[facility] was negative with ratio of 1.21.  There has been a suggestion of paraneoplastic syndrome, but metastatic workup has been negative.  PAST THERAPY:  Status post left breast lumpectomy on August 27, 2009, with negative margins and no lymph node biopsy given her symptoms.  She is also status post adjuvant radiation therapy.  CURRENT THERAPY:  Started adjuvant hormonal therapy utilizing anastrozole in January 2011.  INTERIM HISTORY:  [name] presents since today for an office followup visit.  Overall she reports that she has been doing remarkably well. Her last MGM was on 9/17/ 2012, where stable parenchymal pattern was seen and o findings worrisome for developing malignancy were observed.  She reports that she is not having the esophageal spasms that caused her episodes of loud noise from her diaphragm.  Pt is due for upper endoscopy by Dr. Bosie Clos sometime in March for a follow up.She denies passing any obvious blood such as melena, hematochezia, or hematuria.   She still remains on diazepam and clonidine by her PCP.Glynis Smiles has a well known posterior cervical nodule which is unchanged since prior visits.   She does not have any other subcutaneous nodules anywhere else or breast lesions.  She reports she is eating well.  She is not having any visual changes or headaches, any dysphagia, odynophagia, any mucositis, any nausea, vomiting, diarrhea, constipation, chest pain, shortness of breath, productive cough, abdominal pain or abdominal swelling, any lower extremity paresthesias, bowel or bladder incontinence.  She had a recent URI which is now controlled, and she is due for a follow up with Dr. Cleta Alberts, PCP, soon.   She does have  some chronic myalgias/arthralgias, however, this has been stable. In addition,.  She denies any fevers or chills.  MEDICAL HISTORY: Past Medical History  Diagnosis Date  . Autoimmune disease   . Dysphagia   . Hyperlipidemia   . Breast cancer 2010    cT1 N0 M0; ER+/ PR+/ HER2 neg; s/p lumpectomy 07/2010; started adjuvant hormonal therapy 10/2009    SURGICAL HISTORY:  Past Surgical History  Procedure Date  . Abdominal hysterectomy   . Hand surgery   . Ovarian cyst removal   . Appendectomy   . Breast lumpectomy     MEDICATIONS: Current Outpatient Prescriptions  Medication Sig Dispense Refill  . acetaminophen (TYLENOL) 325 MG tablet Take 650 mg by mouth every 6 (six) hours as needed.        Marland Kitchen alendronate (FOSAMAX) 35 MG tablet Take 35 mg by mouth every 7 (seven) days. Take with a full glass of water on an empty stomach.       Marland Kitchen anastrozole (ARIMIDEX) 1 MG tablet Take 1 mg by mouth daily.        . Calcium Carbonate-Vitamin D (CALCIUM + D PO) Take by mouth daily.        . cloNIDine (CATAPRES) 0.1 MG tablet Take 0.1 mg by mouth daily.        . Diazepam (VALIUM PO) Take 5 mg by mouth as needed.       Marland Kitchen HYDROcodone-acetaminophen (NORCO) 5-325 MG per tablet Take 1 tablet by mouth every 8 (eight) hours as needed.        Marland Kitchen ibuprofen (  ADVIL,MOTRIN) 200 MG tablet Take 200 mg by mouth every 6 (six) hours as needed.        . latanoprost (XALATAN) 0.005 % ophthalmic solution Place 1 drop into both eyes at bedtime.        Marland Kitchen omeprazole (PRILOSEC) 20 MG capsule Take 20 mg by mouth daily.        . Rosuvastatin Calcium (CRESTOR PO) Take 10 mg by mouth daily.         ALLERGIES:  is allergic to daypro.  REVIEW OF SYSTEMS:  The rest of the 14-point review of system was negative.   Filed Vitals:   10/20/11 1050  BP: 109/59  Pulse: 64  Temp: 97.4 F (36.3 C)   Wt Readings from Last 3 Encounters:  10/20/11 110 lb 9.6 oz (50.168 kg)  06/11/11 109 lb 14.4 oz (49.85 kg)   ECOG Performance  status:   PHYSICAL EXAMINATION:   General:  This is a pleasant thin-appearing white female in no acute distress.   HEENT:  Reveals a normocephalic, atraumatic skull.  There are no oropharyngeal lesions.  Neck:  Supple.  Lymph Node Exam:  Negative for any cervical, supraclavicular, or axillary adenopathy.  Lungs:  Clear bilaterally.  Cardiac Exam:  Regular rate and rhythm with a normal S1 and S2.  There are no murmurs, rubs, or bruits.  Abdominal Exam:  Soft with good bowel sounds.  There is no palpable abdominal mass.  There is no palpable hepatosplenomegaly.  Musculoskeletal Exam:  Reveals no tenderness to spine, ribs, or hips.  Extremities:  Reveal no clubbing, cyanosis, or edema.  Neurologic:  Nonfocal.  Bilateral Breast Exam:  Negative for any palpable breast masses, nipple discharge, nipple inversion, peau d'orange, or skin thickening. No erythema. Skin/Lymph Exam:  Does reveal a 1 x 5 x 1-cm left-sided postauricular adenopathy, unchanged from prior exam, however no cervical, supraclavicular, or axillary adenopathy bilaterally.  There is no erythema.     LABORATORY/RADIOLOGY DATA:   Lab 10/20/11 0954  WBC 9.8  HGB 11.2*  HCT 33.4*  PLT 196  MCV 87.7  MCH 29.5  MCHC 33.7  RDW 16.1*  LYMPHSABS 0.7*  MONOABS 0.7  EOSABS 0.2  BASOSABS 0.0  BANDABS --    CMP   No results found for this basename: NA:5,K:5,CL:5,CO2:5,GLUCOSE:5,BUN:5,CREATININE:5,GFRCGP,:5,CALCIUM:5,MG:5,AST:5,ALT:5,ALKPHOS:5,BILITOT:5 in the last 168 hours      Component Value Date/Time   BILITOT 0.3 06/11/2011 0954   BILITOT 0.3 06/11/2011 0954     Radiology Studies:  No results found.     ASSESSMENT AND PLAN:   This is a 78 year old female with the following issues:   1. Breast cancer.  Overall she is doing quite well on adjuvant hormonal therapy utilizing anastrozole without any major toxicity.  She will continue this for a total of 5 years.  2. Left postauricular adenopathy.  This has not changed  since she was last seen.  There are no other subcutaneous nodules anywhere else throughout her exam today.  She will continue to monitor this and if there is any type of change, we can refer her to [facility] for a biopsy.  3. Osteopenia.  She is on calcium, vitamin D, and alendronate once a week.  4. Followup.  Ms. Arocho will follow back up with Dr. Gaylyn Rong on in 5 month  but before then should there be any questions or concerns.More than 50 percent of the time has been spent in counseling. Dr. Gaylyn Rong has spent 95 percent of the face to face encounter.

## 2011-10-20 NOTE — Telephone Encounter (Signed)
gve the pt her June 2013 appt calendar 

## 2011-10-21 ENCOUNTER — Telehealth: Payer: Self-pay | Admitting: Oncology

## 2011-10-21 NOTE — Telephone Encounter (Signed)
lmonvm adviisng the pt of her bone density scan appt in jan

## 2011-10-27 ENCOUNTER — Encounter: Payer: Self-pay | Admitting: *Deleted

## 2011-10-28 ENCOUNTER — Encounter: Payer: Self-pay | Admitting: *Deleted

## 2011-11-10 ENCOUNTER — Ambulatory Visit
Admission: RE | Admit: 2011-11-10 | Discharge: 2011-11-10 | Disposition: A | Discharge status: Home / Self Care | Payer: Medicare Other | Source: Ambulatory Visit | Attending: Oncology | Admitting: Oncology

## 2011-11-10 DIAGNOSIS — C50919 Malignant neoplasm of unspecified site of unspecified female breast: Secondary | ICD-10-CM

## 2011-11-10 DIAGNOSIS — M8580 Other specified disorders of bone density and structure, unspecified site: Secondary | ICD-10-CM

## 2011-11-11 NOTE — Progress Notes (Signed)
Patient has dentist appointment with Dr. Chase Picket tomorrow (11/12/11) at 1145, to evaluate dental status.

## 2011-11-13 NOTE — Progress Notes (Signed)
Received call from patient's dentist, Dr. Zachery Dauer [phone]) states that dental exam is cleared, patient does not need any dental work at this time.

## 2011-11-14 ENCOUNTER — Other Ambulatory Visit: Payer: Self-pay | Admitting: Oncology

## 2011-11-14 DIAGNOSIS — C50919 Malignant neoplasm of unspecified site of unspecified female breast: Secondary | ICD-10-CM

## 2011-11-14 MED ORDER — DENOSUMAB 60 MG/ML ~~LOC~~ SOLN: 60.0000 mg | Freq: Once | SUBCUTANEOUS | Status: DC

## 2011-11-17 ENCOUNTER — Telehealth: Payer: Self-pay | Admitting: Oncology

## 2011-11-17 NOTE — Telephone Encounter (Signed)
called pts home lmovm for appt on 2-06 and to rtn call to confirm appt

## 2011-11-17 NOTE — Telephone Encounter (Signed)
pt rtn call and confirmed appt for 02/06

## 2011-11-18 ENCOUNTER — Ambulatory Visit (INDEPENDENT_AMBULATORY_CARE_PROVIDER_SITE_OTHER): Payer: MEDICARE | Admitting: Emergency Medicine

## 2011-11-18 ENCOUNTER — Ambulatory Visit: Payer: MEDICARE

## 2011-11-18 ENCOUNTER — Encounter: Payer: Self-pay | Admitting: Emergency Medicine

## 2011-11-18 ENCOUNTER — Other Ambulatory Visit: Payer: Self-pay | Admitting: Oncology

## 2011-11-18 ENCOUNTER — Other Ambulatory Visit: Payer: Self-pay | Admitting: Certified Registered Nurse Anesthetist

## 2011-11-18 VITALS — BP 136/79 | HR 61 | Temp 97.6°F | Resp 16 | Ht 62.5 in | Wt 111.6 lb

## 2011-11-18 DIAGNOSIS — E785 Hyperlipidemia, unspecified: Secondary | ICD-10-CM

## 2011-11-18 DIAGNOSIS — C50919 Malignant neoplasm of unspecified site of unspecified female breast: Secondary | ICD-10-CM

## 2011-11-18 DIAGNOSIS — J189 Pneumonia, unspecified organism: Secondary | ICD-10-CM

## 2011-11-18 DIAGNOSIS — J159 Unspecified bacterial pneumonia: Secondary | ICD-10-CM

## 2011-11-18 DIAGNOSIS — I10 Essential (primary) hypertension: Secondary | ICD-10-CM

## 2011-11-18 MED ORDER — CLONIDINE HCL 0.1 MG PO TABS: 0.1000 mg | ORAL_TABLET | Freq: Every day | ORAL | Status: DC

## 2011-11-18 NOTE — Progress Notes (Signed)
  Subjective:    Patient ID: [name], female    DOB: 21-Sep-1931, 78 y.o.   MRN: [id number]  HPI patient enters for followup of her esophageal spasm. She overall has been feeling well no complaints at this time. She's been seen at cancer doctor regular regarding her breast cancer and is doing very well.    Review of Systems she has no specific complaints     Objective:   Physical Exam her physical exam reveals an alert cooperative female who is not in any acute distress. Chest is clear to exam. Heart regular rate and rhythm. Abdomen soft no tenderness liver and spleen are not enlarged.     Marland KitchenUMFC reading (PRIMARY) by  Drdaub NAD.  Assessment & Plan:  No change in treatment plan to. Refill called into the [facility] for clonidine 0.1 to take 1 a day for her esophageal spasm.

## 2011-11-19 ENCOUNTER — Other Ambulatory Visit: Payer: Medicare Other | Admitting: Lab

## 2011-11-19 ENCOUNTER — Ambulatory Visit (HOSPITAL_BASED_OUTPATIENT_CLINIC_OR_DEPARTMENT_OTHER): Payer: Medicare Other

## 2011-11-19 DIAGNOSIS — C50919 Malignant neoplasm of unspecified site of unspecified female breast: Secondary | ICD-10-CM

## 2011-11-19 DIAGNOSIS — M810 Age-related osteoporosis without current pathological fracture: Secondary | ICD-10-CM

## 2011-11-19 LAB — BASIC METABOLIC PANEL
BUN: 23 mg/dL (ref 6–23)
CO2: 24 mEq/L (ref 19–32)
Calcium: 10.3 mg/dL (ref 8.4–10.5)
Chloride: 104 mEq/L (ref 96–112)
Creatinine, Ser: 1.13 mg/dL — ABNORMAL HIGH (ref 0.50–1.10)
Glucose, Bld: 92 mg/dL (ref 70–99)
Potassium: 4.6 mEq/L (ref 3.5–5.3)
Sodium: 141 mEq/L (ref 135–145)

## 2011-11-19 MED ORDER — DENOSUMAB 60 MG/ML ~~LOC~~ SOLN
60.0000 mg | Freq: Once | SUBCUTANEOUS | Status: AC
  Administered 2011-11-19: 60 mg via SUBCUTANEOUS
  Filled 2011-11-19: qty 1

## 2011-11-19 NOTE — Patient Instructions (Signed)
[facility] Discharge Instructions for Patients Receiving  Today you received the following agents Prolia   If you develop nausea and vomiting that is not controlled by your nausea medication, call the clinic. If it is after clinic hours your family physician or the after hours number for the clinic or go to the Emergency Department.   BELOW ARE SYMPTOMS THAT SHOULD BE REPORTED IMMEDIATELY:  *FEVER GREATER THAN 100.5 F  *CHILLS WITH OR WITHOUT FEVER  NAUSEA AND VOMITING THAT IS NOT CONTROLLED WITH YOUR NAUSEA MEDICATION  *UNUSUAL SHORTNESS OF BREATH  *UNUSUAL BRUISING OR BLEEDING  TENDERNESS IN MOUTH AND THROAT WITH OR WITHOUT PRESENCE OF ULCERS  *URINARY PROBLEMS  *BOWEL PROBLEMS  UNUSUAL RASH Items with * indicate a potential emergency and should be followed up as soon as possible.   Feel free to call the clinic you have any questions or concerns. The clinic phone number is [phone].   I have been informed and understand all the instructions given to me. I know to contact the clinic, my physician, or go to the Emergency Department if any problems should occur. I do not have any questions at this time, but understand that I may call the clinic during office hours   should I have any questions or need assistance in obtaining follow up care.    __________________________________________  _____________  __________ Signature of Patient or Authorized Representative            Date                   Time    __________________________________________ Nurse's Signature

## 2011-11-19 NOTE — Progress Notes (Addendum)
New Prolia injection today: Pt. arrived with new order in system.  Managed Care contacted and had to wait for pre-cert. Above relayed to pt. and she was very patient with [place].  Approval obtained per Thelma Barge.  Written and verbal info. reviewed with pt.   She is feeling well today without any complaints. Pt. has been off of Fosamax since last Saturday and has been taking her calcium and Vit D. education [id number] cal and Vit D. completed. Pt. aware of above and will call MD with further questions.  HL

## 2011-12-04 ENCOUNTER — Telehealth: Payer: Self-pay

## 2011-12-04 NOTE — Telephone Encounter (Signed)
Patient would like for Dr. Cleta Alberts nurse to contact her about a medication she is taking called clondine, she thinks it may be the wrong dosage.

## 2011-12-04 NOTE — Telephone Encounter (Signed)
Advised pt of mg of clonidine and she stated that what she got was a little bit bigger than ususal.  She spoke with pharmacist and they confirmed she had the right medication

## 2011-12-15 ENCOUNTER — Encounter (INDEPENDENT_AMBULATORY_CARE_PROVIDER_SITE_OTHER): Payer: Self-pay | Admitting: General Surgery

## 2011-12-16 ENCOUNTER — Encounter (INDEPENDENT_AMBULATORY_CARE_PROVIDER_SITE_OTHER): Payer: Self-pay | Admitting: General Surgery

## 2011-12-16 ENCOUNTER — Ambulatory Visit (INDEPENDENT_AMBULATORY_CARE_PROVIDER_SITE_OTHER): Payer: Medicare Other | Admitting: General Surgery

## 2011-12-16 VITALS — BP 118/76 | HR 68 | Temp 97.0°F | Resp 16 | Ht 62.5 in | Wt 112.0 lb

## 2011-12-16 DIAGNOSIS — C50919 Malignant neoplasm of unspecified site of unspecified female breast: Secondary | ICD-10-CM

## 2011-12-16 NOTE — Patient Instructions (Signed)
Continue regular self exams Continue arimidex 

## 2011-12-16 NOTE — Progress Notes (Signed)
Subjective:     Patient ID: [name], female   DOB: Dec 06, 1930, 78 y.o.   MRN: [id number]  HPI The patient is a 78 year old white female who is now 2 years in 3 months out from a left breast lumpectomy. She had a lobular T1B breast cancer that was ER and PR positive. During the last year she has had no major medical problems. Her left breast pain is improving. She continues to take Arimidex and is tolerating it well except for some night sweats on occasion.  Review of Systems  Constitutional: Negative.   HENT: Negative.   Eyes: Negative.   Respiratory: Negative.   Cardiovascular: Negative.   Gastrointestinal: Negative.   Genitourinary: Negative.   Musculoskeletal: Negative.   Skin: Negative.   Neurological: Negative.   Hematological: Negative.   Psychiatric/Behavioral: Negative.        Objective:   Physical Exam  Constitutional: She is oriented to person, place, and time. She appears well-developed and well-nourished.  HENT:  Head: Normocephalic and atraumatic.  Eyes: Conjunctivae and EOM are normal. Pupils are equal, round, and reactive to light.  Neck: Normal range of motion. Neck supple.  Cardiovascular: Normal rate, regular rhythm and normal heart sounds.   Pulmonary/Chest: Effort normal and breath sounds normal.       She still has some soreness of the left breast. No palpable mass in either breast. No axillary supraclavicular or cervical lymphadenopathy.  Abdominal: Soft. Bowel sounds are normal. She exhibits no mass. There is no tenderness.  Musculoskeletal: Normal range of motion.  Neurological: She is alert and oriented to person, place, and time.  Skin: Skin is warm and dry.  Psychiatric: She has a normal mood and affect. Her behavior is normal.       Assessment:     2 years and 3 months status post left breast lumpectomy    Plan:     At this point she will continue to take Arimidex. She will also continue to do regular self exams. She will see Dr. Gaylyn Rong in 6  months and we will see her back in one year

## 2011-12-25 ENCOUNTER — Telehealth: Payer: Self-pay | Admitting: *Deleted

## 2011-12-25 NOTE — Telephone Encounter (Signed)
Pt called to report a painful/sore irritated spot near her vagina (perineum)  x 2 weeks. States itchy, but denies any discharge.   Asks if this could be related to the injection, Prolia, and I informed her this is not a side effect of Prolia.  I instructed pt to call her GYN or PCP and she states she has already called PCP and has appt on Monday.  Instructed her to keep this appt,  I will notify Dr. Gaylyn Rong and call her if he has any further instructions.  She verbalized understanding.

## 2011-12-28 ENCOUNTER — Ambulatory Visit (INDEPENDENT_AMBULATORY_CARE_PROVIDER_SITE_OTHER): Payer: Medicare Other | Admitting: Emergency Medicine

## 2011-12-28 VITALS — BP 184/74 | HR 66 | Temp 97.7°F | Resp 16 | Ht 62.38 in | Wt 114.2 lb

## 2011-12-28 DIAGNOSIS — R21 Rash and other nonspecific skin eruption: Secondary | ICD-10-CM

## 2011-12-28 LAB — POCT WET PREP WITH KOH
Clue Cells Wet Prep HPF POC: NEGATIVE
KOH Prep POC: POSITIVE
Trichomonas, UA: NEGATIVE
Yeast Wet Prep HPF POC: NEGATIVE

## 2011-12-28 MED ORDER — CLOTRIMAZOLE-BETAMETHASONE 1-0.05 % EX LOTN: TOPICAL_LOTION | Freq: Two times a day (BID) | CUTANEOUS | Status: DC

## 2011-12-28 NOTE — Progress Notes (Signed)
  Subjective:    Patient ID: [name], female    DOB: Aug 21, 1931, 78 y.o.   MRN: [id number]  HPI patient enters with a lot of itching and burning of the vaginal area. This has been going on for last month. She has no particular discharge just the itching and burning. She has had no change in medications except  she now is on a shot for her osteoporosis she takes every 6 months    Review of Systems noncontributory as related to this illness.     Objective:   Physical Exam  Constitutional: She appears well-developed and well-nourished.  HENT:  Head: Normocephalic.  Eyes: Pupils are equal, round, and reactive to light.  Neck: No JVD present. No tracheal deviation present. No thyromegaly present.  Cardiovascular: Normal rate and regular rhythm.   Pulmonary/Chest: No respiratory distress. She has no wheezes. She has no rales. She exhibits no tenderness.  Abdominal: She exhibits no distension and no mass. There is no tenderness. There is no rebound and no guarding.  Lymphadenopathy:    She has no cervical adenopathy.   examination of the introitus reveals redness and some swelling with breaking of the skin at the base of the vaginal vault that extends down across the perineum and to the anus.        Assessment & Plan:  History is consistent with a yeast vaginitis. Will check a KOH of the area and treat as a yeast infection

## 2012-01-28 ENCOUNTER — Other Ambulatory Visit: Payer: Self-pay | Admitting: Gastroenterology

## 2012-01-29 ENCOUNTER — Other Ambulatory Visit: Payer: Self-pay

## 2012-01-29 MED ORDER — ANASTROZOLE 1 MG PO TABS: 1.0000 mg | ORAL_TABLET | Freq: Every day | ORAL | Status: DC

## 2012-02-10 ENCOUNTER — Encounter: Payer: Self-pay | Admitting: *Deleted

## 2012-02-10 NOTE — Progress Notes (Signed)
Report received from [facility] for Upper GI endoscopy done on 01/28/12.  Forwarded to Dr. Gaylyn Rong

## 2012-02-18 ENCOUNTER — Encounter: Payer: Self-pay | Admitting: Oncology

## 2012-02-19 ENCOUNTER — Encounter: Payer: Self-pay | Admitting: Oncology

## 2012-03-05 ENCOUNTER — Telehealth: Payer: Self-pay

## 2012-03-05 NOTE — Telephone Encounter (Signed)
Dr. Cleta Alberts pt had minor surgery yesterday - right hand - local anesthesia - today she has high blood pressure reading of 174/94 and currently 144/92 and is nausea.  What needs to be done for the patient

## 2012-03-06 DIAGNOSIS — S4290XA Fracture of unspecified shoulder girdle, part unspecified, initial encounter for closed fracture: Secondary | ICD-10-CM | POA: Insufficient documentation

## 2012-03-06 HISTORY — DX: Fracture of unspecified shoulder girdle, part unspecified, initial encounter for closed fracture: S42.90XA

## 2012-03-06 NOTE — Telephone Encounter (Signed)
Spoke with pt advised to keep watch on her BP and she states the meds were probably making her sick. She is feeling much better today. Blood pressure better

## 2012-03-13 DIAGNOSIS — S42309A Unspecified fracture of shaft of humerus, unspecified arm, initial encounter for closed fracture: Secondary | ICD-10-CM | POA: Insufficient documentation

## 2012-03-13 HISTORY — DX: Unspecified fracture of shaft of humerus, unspecified arm, initial encounter for closed fracture: S42.309A

## 2012-03-16 ENCOUNTER — Ambulatory Visit: Payer: Medicare Other | Admitting: Emergency Medicine

## 2012-03-18 ENCOUNTER — Telehealth: Payer: Self-pay | Admitting: Oncology

## 2012-03-18 ENCOUNTER — Ambulatory Visit (HOSPITAL_BASED_OUTPATIENT_CLINIC_OR_DEPARTMENT_OTHER): Payer: Medicare Other | Admitting: Oncology

## 2012-03-18 ENCOUNTER — Encounter: Payer: Self-pay | Admitting: Oncology

## 2012-03-18 ENCOUNTER — Other Ambulatory Visit (HOSPITAL_BASED_OUTPATIENT_CLINIC_OR_DEPARTMENT_OTHER): Payer: Medicare Other | Admitting: Lab

## 2012-03-18 VITALS — BP 144/85 | HR 63 | Temp 96.7°F | Ht 62.3 in | Wt 112.0 lb

## 2012-03-18 DIAGNOSIS — C50919 Malignant neoplasm of unspecified site of unspecified female breast: Secondary | ICD-10-CM

## 2012-03-18 DIAGNOSIS — E785 Hyperlipidemia, unspecified: Secondary | ICD-10-CM

## 2012-03-18 DIAGNOSIS — Z170 Estrogen receptor positive status [ER+]: Secondary | ICD-10-CM

## 2012-03-18 DIAGNOSIS — M810 Age-related osteoporosis without current pathological fracture: Secondary | ICD-10-CM

## 2012-03-18 HISTORY — DX: Age-related osteoporosis without current pathological fracture: M81.0

## 2012-03-18 LAB — CBC WITH DIFFERENTIAL/PLATELET
BASO%: 0.3 % (ref 0.0–2.0)
Basophils Absolute: 0 10*3/uL (ref 0.0–0.1)
EOS%: 2.5 % (ref 0.0–7.0)
Eosinophils Absolute: 0.2 10*3/uL (ref 0.0–0.5)
HCT: 32.5 % — ABNORMAL LOW (ref 34.8–46.6)
HGB: 10.7 g/dL — ABNORMAL LOW (ref 11.6–15.9)
LYMPH%: 16.2 % (ref 14.0–49.7)
MCH: 28.5 pg (ref 25.1–34.0)
MCHC: 32.8 g/dL (ref 31.5–36.0)
MCV: 86.9 fL (ref 79.5–101.0)
MONO#: 0.5 10*3/uL (ref 0.1–0.9)
MONO%: 7.2 % (ref 0.0–14.0)
NEUT#: 5.6 10*3/uL (ref 1.5–6.5)
NEUT%: 73.8 % (ref 38.4–76.8)
Platelets: 339 10*3/uL (ref 145–400)
RBC: 3.74 10*6/uL (ref 3.70–5.45)
RDW: 16.7 % — ABNORMAL HIGH (ref 11.2–14.5)
WBC: 7.6 10*3/uL (ref 3.9–10.3)
lymph#: 1.2 10*3/uL (ref 0.9–3.3)
nRBC: 0 % (ref 0–0)

## 2012-03-18 LAB — COMPREHENSIVE METABOLIC PANEL
ALT: 11 U/L (ref 0–35)
AST: 20 U/L (ref 0–37)
Albumin: 3.8 g/dL (ref 3.5–5.2)
Alkaline Phosphatase: 94 U/L (ref 39–117)
BUN: 19 mg/dL (ref 6–23)
CO2: 25 mEq/L (ref 19–32)
Calcium: 8.9 mg/dL (ref 8.4–10.5)
Chloride: 103 mEq/L (ref 96–112)
Creatinine, Ser: 0.98 mg/dL (ref 0.50–1.10)
Glucose, Bld: 87 mg/dL (ref 70–99)
Potassium: 4.5 mEq/L (ref 3.5–5.3)
Sodium: 137 mEq/L (ref 135–145)
Total Bilirubin: 0.5 mg/dL (ref 0.3–1.2)
Total Protein: 6.4 g/dL (ref 6.0–8.3)

## 2012-03-18 NOTE — Progress Notes (Signed)
[facility]  Telephone:(336) [phone] Fax:(336) [phone]   OFFICE PROGRESS NOTE   Cc:  DAUB, STEVE A, MD, MD  DIAGNOSIS:   History of T1 N0 M0;  measured 10 x 6 x 10 mm on mammogram, invasive lobular carcinoma, low grade, estrogen receptor 83%, progesterone receptor 63%, Ki-67 of 6%, HER2/neu by [facility] was negative with ratio of 1.21.   PAST THERAPY: Status post left breast lumpectomy on August 27, 2009, with negative margins and no lymph node biopsy given her symptoms. She is also status post adjuvant radiation therapy.   CURRENT THERAPY: Started adjuvant hormonal therapy utilizing anastrozole in January 2011.  She is also on Prolia SQ every 6 months for osteoporosis.   INTERVAL HISTORY: [name] 78 y.o. female returns for regular follow up.  She recently fracture her left arm and shoulder when she tripped and fell last month. She was evaluated by Dr. Amanda Pea.  She has been in a sling.  She is taking anastrozole with only mild hot flash/night sweat.  She has baseline mild arthralgia which has not worsened with anastrazole.  She denies any breast abnormality.   Patient denies fatigue, headache, visual changes, confusion, drenching night sweats, palpable lymph node swelling, mucositis, odynophagia, dysphagia, nausea vomiting, jaundice, chest pain, palpitation, shortness of breath, dyspnea on exertion, productive cough, gum bleeding, epistaxis, hematemesis, hemoptysis, abdominal pain, abdominal swelling, early satiety, melena, hematochezia, hematuria, skin rash, spontaneous bleeding, joint swelling, heat or cold intolerance, bowel bladder incontinence, back pain, focal motor weakness, paresthesia.    Past Medical History  Diagnosis Date  . Autoimmune disease   . Dysphagia   . Hyperlipidemia   . Breast cancer 2010    cT1 N0 M0; ER+/ PR+/ HER2 neg; s/p lumpectomy 07/2010; started adjuvant hormonal therapy 10/2009  . Barrett's esophagus   . Osteopenia   . Pneumonia   .  Cervical cancer   . Neuromuscular disorder   . Hypertension   . Glaucoma   . Bone lesion     sacrum  . Osteoporosis   . Osteoporosis 03/18/2012    Past Surgical History  Procedure Date  . Hand surgery   . Ovarian cyst removal 1978  . Appendectomy   . Breast lumpectomy   . Ortho procedures     multiple  . Abdominal hysterectomy 1972    cancer          1 tube 1 ovary  . Shoulder surgery 1954    left    Current Outpatient Prescriptions  Medication Sig Dispense Refill  . anastrozole (ARIMIDEX) 1 MG tablet Take 1 tablet (1 mg total) by mouth daily.  90 tablet  3  . Calcium Carbonate-Vitamin D (CALCIUM + D PO) Take 1 capsule by mouth 3 (three) times daily.       . cloNIDine (CATAPRES) 0.1 MG tablet Take 1 tablet (0.1 mg total) by mouth daily.  90 tablet  3  . denosumab (PROLIA) 60 MG/ML SOLN injection Inject 60 mg into the skin every 6 (six) months. Administer in upper arm, thigh, or abdomen      . ibuprofen (ADVIL,MOTRIN) 200 MG tablet Take 200 mg by mouth every 6 (six) hours as needed.        . latanoprost (XALATAN) 0.005 % ophthalmic solution Place 1 drop into both eyes at bedtime.        Marland Kitchen omeprazole (PRILOSEC) 20 MG capsule Take 20 mg by mouth daily.        . Rosuvastatin Calcium (CRESTOR  PO) Take 10 mg by mouth daily.         ALLERGIES:  is allergic to daypro.  REVIEW OF SYSTEMS:  The rest of the 14-point review of system was negative.   Filed Vitals:   03/18/12 1017  BP: 144/85  Pulse: 63  Temp: 96.7 F (35.9 C)   Wt Readings from Last 3 Encounters:  03/18/12 112 lb (50.803 kg)  12/28/11 114 lb 3.2 oz (51.801 kg)  12/16/11 112 lb (50.803 kg)   ECOG Performance status: 1  PHYSICAL EXAMINATION:   General:  Thin-appearing woman, in no acute distress.  Eyes:  no scleral icterus.  ENT:  There were no oropharyngeal lesions.  Neck was without thyromegaly.  Lymphatics:  Negative cervical, supraclavicular or axillary adenopathy.  Respiratory: lungs were clear bilaterally  without wheezing or crackles.  Cardiovascular:  Regular rate and rhythm, S1/S2, without murmur, rub or gallop.  There was no pedal edema.  GI:  abdomen was soft, flat, nontender, nondistended, without organomegaly.  Muscoloskeletal:  no spinal tenderness of palpation of vertebral spine.  Her left arm was in a sling.  Skin exam was without echymosis, petichae.  Neuro exam was nonfocal.  Patient was able to get on and off exam table without assistance.  Gait was normal.  Patient was alerted and oriented.  Attention was good.   Language was appropriate.  Mood was normal without depression.  Speech was not pressured.  Thought content was not tangential.  Bilateral breast exam was negative for palpable mass, skin thickening, erythema, purulent discharge.    LABORATORY/RADIOLOGY DATA:  Lab Results  Component Value Date   WBC 7.6 03/18/2012   HGB 10.7* 03/18/2012   HCT 32.5* 03/18/2012   PLT 339 03/18/2012   GLUCOSE 92 11/19/2011   CHOL 171 07/09/2011   TRIG 65 07/09/2011   HDL 71* 07/09/2011   LDLCALC 87 07/09/2011   ALKPHOS 78 10/20/2011   ALT 13 10/20/2011   AST 27 10/20/2011   NA 141 11/19/2011   K 4.6 11/19/2011   CL 104 11/19/2011   CREATININE 1.13* 11/19/2011   BUN 23 11/19/2011   CO2 24 11/19/2011   INR 1.06 11/29/2009    ASSESSMENT AND PLAN:   1. History of breast cancer:  There is no evidence of recurrence or metastatic disease on clinical history, physical exam, lab.  She is tolerating AI well without major side effects.  I recommended AI for 5 years total.  2. Osteoporosis: She is on calcium, vitamin D and every 6 months Prolia.  Next dose is due in August 2013.  3. Left arm/shoulder fracture:  F/u with Dr. Amanda Pea.  4. Followup.  [facility] in [facility]/2012; I ordered this today.  Next bone density scan is in Jan 2015.  Visit in about 6 months.     The length of time of the face-to-face encounter was 15 minutes. More than 50% of time was spent counseling and coordination of care.

## 2012-03-18 NOTE — Telephone Encounter (Signed)
Gv pt appt for aug-dec2013.  pt stated that she will scheduled her mammogram

## 2012-04-13 ENCOUNTER — Telehealth: Payer: Self-pay | Admitting: Nutrition

## 2012-04-13 NOTE — Telephone Encounter (Signed)
Patient returned call and reports she appreciated the follow up on her oral intake and appetite.  She believes she is eating better and thinks she is most likely maintaining her weight.  I encouraged her to contact me if she had any questions or concerns.  Patient is agreeable.  She was very appreciative of phone call.

## 2012-04-13 NOTE — Telephone Encounter (Signed)
Patient had a positive nutrition risk screen secondary to weight loss and poor appetite with poor intake.  I called her cell and left a message that I was available to offer nutrition education/assistance as needed and provided a number for her to call me back if she wanted to discuss further.

## 2012-04-20 ENCOUNTER — Ambulatory Visit: Payer: Medicare Other | Attending: Orthopedic Surgery | Admitting: Physical Therapy

## 2012-04-20 DIAGNOSIS — M25519 Pain in unspecified shoulder: Secondary | ICD-10-CM | POA: Insufficient documentation

## 2012-04-20 DIAGNOSIS — M6281 Muscle weakness (generalized): Secondary | ICD-10-CM | POA: Insufficient documentation

## 2012-04-20 DIAGNOSIS — M25619 Stiffness of unspecified shoulder, not elsewhere classified: Secondary | ICD-10-CM | POA: Insufficient documentation

## 2012-04-20 DIAGNOSIS — IMO0001 Reserved for inherently not codable concepts without codable children: Secondary | ICD-10-CM | POA: Insufficient documentation

## 2012-04-22 ENCOUNTER — Ambulatory Visit: Payer: Medicare Other | Admitting: Rehabilitation

## 2012-04-27 ENCOUNTER — Ambulatory Visit: Payer: Medicare Other | Admitting: Physical Therapy

## 2012-04-29 ENCOUNTER — Ambulatory Visit: Payer: Medicare Other | Admitting: Physical Therapy

## 2012-05-06 ENCOUNTER — Ambulatory Visit: Payer: Medicare Other | Admitting: Physical Therapy

## 2012-05-12 ENCOUNTER — Ambulatory Visit: Payer: Medicare Other | Admitting: Rehabilitation

## 2012-05-12 ENCOUNTER — Other Ambulatory Visit: Payer: Self-pay | Admitting: Oncology

## 2012-05-12 DIAGNOSIS — C50919 Malignant neoplasm of unspecified site of unspecified female breast: Secondary | ICD-10-CM

## 2012-05-12 DIAGNOSIS — M810 Age-related osteoporosis without current pathological fracture: Secondary | ICD-10-CM

## 2012-05-13 ENCOUNTER — Other Ambulatory Visit (HOSPITAL_BASED_OUTPATIENT_CLINIC_OR_DEPARTMENT_OTHER): Payer: Medicare Other

## 2012-05-13 ENCOUNTER — Ambulatory Visit (HOSPITAL_BASED_OUTPATIENT_CLINIC_OR_DEPARTMENT_OTHER): Payer: Medicare Other

## 2012-05-13 VITALS — BP 149/76 | HR 49 | Temp 96.9°F

## 2012-05-13 DIAGNOSIS — C50919 Malignant neoplasm of unspecified site of unspecified female breast: Secondary | ICD-10-CM

## 2012-05-13 DIAGNOSIS — M810 Age-related osteoporosis without current pathological fracture: Secondary | ICD-10-CM

## 2012-05-13 DIAGNOSIS — C50419 Malignant neoplasm of upper-outer quadrant of unspecified female breast: Secondary | ICD-10-CM

## 2012-05-13 LAB — BASIC METABOLIC PANEL
BUN: 22 mg/dL (ref 6–23)
CO2: 24 mEq/L (ref 19–32)
Calcium: 9.5 mg/dL (ref 8.4–10.5)
Chloride: 107 mEq/L (ref 96–112)
Creatinine, Ser: 1.02 mg/dL (ref 0.50–1.10)
Glucose, Bld: 86 mg/dL (ref 70–99)
Potassium: 4.4 mEq/L (ref 3.5–5.3)
Sodium: 138 mEq/L (ref 135–145)

## 2012-05-13 MED ORDER — DENOSUMAB 60 MG/ML ~~LOC~~ SOLN
60.0000 mg | Freq: Once | SUBCUTANEOUS | Status: AC
  Administered 2012-05-13: 60 mg via SUBCUTANEOUS
  Filled 2012-05-13: qty 1

## 2012-05-17 ENCOUNTER — Ambulatory Visit (INDEPENDENT_AMBULATORY_CARE_PROVIDER_SITE_OTHER): Payer: Medicare Other | Admitting: Emergency Medicine

## 2012-05-17 ENCOUNTER — Telehealth: Payer: Self-pay | Admitting: *Deleted

## 2012-05-17 ENCOUNTER — Telehealth: Payer: Self-pay | Admitting: Radiology

## 2012-05-17 VITALS — BP 132/78 | HR 58 | Temp 97.3°F | Resp 17 | Ht 62.5 in | Wt 110.0 lb

## 2012-05-17 DIAGNOSIS — R229 Localized swelling, mass and lump, unspecified: Secondary | ICD-10-CM

## 2012-05-17 DIAGNOSIS — R599 Enlarged lymph nodes, unspecified: Secondary | ICD-10-CM

## 2012-05-17 DIAGNOSIS — E785 Hyperlipidemia, unspecified: Secondary | ICD-10-CM

## 2012-05-17 DIAGNOSIS — R2230 Localized swelling, mass and lump, unspecified upper limb: Secondary | ICD-10-CM

## 2012-05-17 DIAGNOSIS — R591 Generalized enlarged lymph nodes: Secondary | ICD-10-CM

## 2012-05-17 MED ORDER — ROSUVASTATIN CALCIUM 10 MG PO TABS: 10.0000 mg | ORAL_TABLET | Freq: Every day | ORAL | Status: DC

## 2012-05-17 MED ORDER — DOXYCYCLINE HYCLATE 100 MG PO TABS: 100.0000 mg | ORAL_TABLET | Freq: Two times a day (BID) | ORAL | Status: AC

## 2012-05-17 MED ORDER — DOXYCYCLINE HYCLATE 100 MG PO CAPS: 100.0000 mg | ORAL_CAPSULE | Freq: Two times a day (BID) | ORAL | Status: DC

## 2012-05-17 NOTE — Progress Notes (Signed)
  Subjective:    Patient ID: [name], female    DOB: 1931-04-21, 78 y.o.   MRN: [id number]  HPI 78 year old female presents with lump under left arm. Noticed when showering last night. Patient states it is painful to touch Last mammogram was October 2012.    Review of Systems     Objective:   Physical Exam 2 by 2 cm node Left axillary node        Assessment & Plan:  Check mammogram. Doxycycline .Discussed with Dr. Lurline Idol refilled

## 2012-05-17 NOTE — Progress Notes (Signed)
  Subjective:    Patient ID: [name], female    DOB: 1930-12-26, 78 y.o.   MRN: [id number]  HPIHx of breast cancer. She feels a lump under her Left arm.    Review of Systems  HENT: Negative.   Eyes: Negative.   Respiratory: Negative.   Cardiovascular: Negative.        Objective:   Physical Exam  2 by 2 cm. Firm area l ant axillary line. Breast nl      Assessment & Plan:  Rx. With doxy. Check mammogram

## 2012-05-17 NOTE — Telephone Encounter (Signed)
I got a call from the breast center the order was put in for screening Mammogram but if pt has lump it should be diagnostic exam left breast and ultrasound of left breast.

## 2012-05-18 ENCOUNTER — Other Ambulatory Visit: Payer: Self-pay | Admitting: Oncology

## 2012-05-18 ENCOUNTER — Ambulatory Visit
Admission: RE | Admit: 2012-05-18 | Discharge: 2012-05-18 | Disposition: A | Discharge status: Home / Self Care | Payer: Medicare Other | Source: Ambulatory Visit | Attending: Emergency Medicine | Admitting: Emergency Medicine

## 2012-05-18 DIAGNOSIS — C50919 Malignant neoplasm of unspecified site of unspecified female breast: Secondary | ICD-10-CM

## 2012-05-18 DIAGNOSIS — M810 Age-related osteoporosis without current pathological fracture: Secondary | ICD-10-CM

## 2012-05-18 DIAGNOSIS — R2230 Localized swelling, mass and lump, unspecified upper limb: Secondary | ICD-10-CM

## 2012-05-20 NOTE — Telephone Encounter (Signed)
Review.

## 2012-05-26 ENCOUNTER — Ambulatory Visit
Admission: RE | Admit: 2012-05-26 | Discharge: 2012-05-26 | Disposition: A | Discharge status: Home / Self Care | Payer: Medicare Other | Source: Ambulatory Visit | Attending: Emergency Medicine | Admitting: Emergency Medicine

## 2012-05-26 ENCOUNTER — Ambulatory Visit
Admission: RE | Admit: 2012-05-26 | Discharge: 2012-05-26 | Disposition: A | Discharge status: Home / Self Care | Payer: Medicare Other | Source: Ambulatory Visit | Attending: Oncology | Admitting: Oncology

## 2012-05-26 DIAGNOSIS — R2230 Localized swelling, mass and lump, unspecified upper limb: Secondary | ICD-10-CM

## 2012-05-26 DIAGNOSIS — M810 Age-related osteoporosis without current pathological fracture: Secondary | ICD-10-CM

## 2012-05-26 DIAGNOSIS — C50919 Malignant neoplasm of unspecified site of unspecified female breast: Secondary | ICD-10-CM

## 2012-06-22 ENCOUNTER — Encounter: Payer: Self-pay | Admitting: Emergency Medicine

## 2012-06-22 ENCOUNTER — Ambulatory Visit (INDEPENDENT_AMBULATORY_CARE_PROVIDER_SITE_OTHER): Payer: Medicare Other | Admitting: Emergency Medicine

## 2012-06-22 ENCOUNTER — Ambulatory Visit: Payer: Medicare Other

## 2012-06-22 VITALS — BP 153/75 | HR 57 | Temp 96.7°F | Resp 16 | Ht 63.0 in | Wt 109.0 lb

## 2012-06-22 DIAGNOSIS — R0781 Pleurodynia: Secondary | ICD-10-CM

## 2012-06-22 DIAGNOSIS — Z23 Encounter for immunization: Secondary | ICD-10-CM

## 2012-06-22 DIAGNOSIS — C50919 Malignant neoplasm of unspecified site of unspecified female breast: Secondary | ICD-10-CM

## 2012-06-22 DIAGNOSIS — R079 Chest pain, unspecified: Secondary | ICD-10-CM

## 2012-06-22 NOTE — Progress Notes (Signed)
  Subjective:    Patient ID: [name], female    DOB: 08/08/1931, 78 y.o.   MRN: [id number]  HPI patient enters for recheck of her abscess she had of the left breast. She has a history of breast cancer underwent repeat evaluation and was found not to have any recurrent cancer. She continues to have a tender knot-like area left lateral ribs. Overall she feels great he normally without specific problems    Review of Systems     Objective:   Physical Exam [name] looks good she is alert and cooperative. Her neck is supple chest is clear to auscultation and percussion cardiac exam reveals regular rate without murmurs. Abdomen soft without tenderness. Extremities are without edema. There is a specific area of tenderness lateral to the left breast. There is no mass felt in this area. Left breast exam did not reveal any nodules or abnormal areas [facility] reading (PRIMARY) by  Dr.Maxwell Lemen there are clips present from previous breast surgery. I do not appreciate any rib lesions or lung lesions   Results for orders placed in visit on 05/13/12  BASIC METABOLIC PANEL      Component Value Range   Sodium 138  135 - 145 mEq/L   Potassium 4.4  3.5 - 5.3 mEq/L   Chloride 107  96 - 112 mEq/L   CO2 24  19 - 32 mEq/L   Glucose, Bld 86  70 - 99 mg/dL   BUN 22  6 - 23 mg/dL   Creatinine, Ser 4.09  0.50 - 1.10 mg/dL   Calcium 9.5  8.4 - 81.1 mg/dL        Assessment & Plan:  Patient with history breast cancer status post infection involving the surgical site of her breast surgery. She is now cleared with the infection her biopsies did not show any signs of recurrent cancer she has persistent area of tenderness were truly feels like it is over the rib. I do not appreciate any lesion of the rib. She is due to see Dr. Gaylyn Rong who is her oncologist. I think we will hold off doing a bone scan or further evaluation unless she continues to have pain in this area. Certainly I would like to hold off until we see what the  radiologist reads on her for films.

## 2012-06-25 ENCOUNTER — Encounter: Payer: Self-pay | Admitting: Family Medicine

## 2012-06-25 DIAGNOSIS — M6530 Trigger finger, unspecified finger: Secondary | ICD-10-CM

## 2012-06-25 DIAGNOSIS — S4290XA Fracture of unspecified shoulder girdle, part unspecified, initial encounter for closed fracture: Secondary | ICD-10-CM

## 2012-07-08 ENCOUNTER — Encounter: Payer: Self-pay | Admitting: Family Medicine

## 2012-07-08 DIAGNOSIS — M6530 Trigger finger, unspecified finger: Secondary | ICD-10-CM | POA: Insufficient documentation

## 2012-07-08 NOTE — Assessment & Plan Note (Signed)
Plan: wrist brace; sling immobilization; Hydrocodone 5-500mg  prn; F/U in 3 weeks.

## 2012-08-04 ENCOUNTER — Ambulatory Visit
Admission: RE | Admit: 2012-08-04 | Discharge: 2012-08-04 | Disposition: A | Discharge status: Home / Self Care | Payer: Medicare Other | Source: Ambulatory Visit | Attending: Oncology | Admitting: Oncology

## 2012-08-04 ENCOUNTER — Other Ambulatory Visit: Payer: Self-pay | Admitting: Oncology

## 2012-08-04 DIAGNOSIS — C50919 Malignant neoplasm of unspecified site of unspecified female breast: Secondary | ICD-10-CM

## 2012-08-04 DIAGNOSIS — M810 Age-related osteoporosis without current pathological fracture: Secondary | ICD-10-CM

## 2012-08-10 ENCOUNTER — Ambulatory Visit (INDEPENDENT_AMBULATORY_CARE_PROVIDER_SITE_OTHER): Payer: Medicare Other | Admitting: Family Medicine

## 2012-08-10 ENCOUNTER — Ambulatory Visit: Payer: Medicare Other

## 2012-08-10 VITALS — BP 126/88 | HR 66 | Temp 97.9°F | Resp 16 | Ht 62.5 in | Wt 108.0 lb

## 2012-08-10 DIAGNOSIS — R05 Cough: Secondary | ICD-10-CM

## 2012-08-10 DIAGNOSIS — B370 Candidal stomatitis: Secondary | ICD-10-CM

## 2012-08-10 DIAGNOSIS — R059 Cough, unspecified: Secondary | ICD-10-CM

## 2012-08-10 LAB — POCT CBC
Granulocyte percent: 64.4 %G (ref 37–80)
HCT, POC: 40.1 % (ref 37.7–47.9)
Hemoglobin: 11.8 g/dL — AB (ref 12.2–16.2)
Lymph, poc: 1.7 (ref 0.6–3.4)
MCH, POC: 26.3 pg — AB (ref 27–31.2)
MCHC: 29.4 g/dL — AB (ref 31.8–35.4)
MCV: 89.6 fL (ref 80–97)
MID (cbc): 0.6 (ref 0–0.9)
MPV: 10.4 fL (ref 0–99.8)
POC Granulocyte: 4.1 (ref 2–6.9)
POC LYMPH PERCENT: 26.6 %L (ref 10–50)
POC MID %: 9 %M (ref 0–12)
Platelet Count, POC: 232 10*3/uL (ref 142–424)
RBC: 4.48 M/uL (ref 4.04–5.48)
RDW, POC: 16.8 %
WBC: 6.4 10*3/uL (ref 4.6–10.2)

## 2012-08-10 MED ORDER — FLUCONAZOLE 150 MG PO TABS: 150.0000 mg | ORAL_TABLET | Freq: Once | ORAL | Status: DC

## 2012-08-10 MED ORDER — MOXIFLOXACIN HCL 400 MG PO TABS: 400.0000 mg | ORAL_TABLET | Freq: Every day | ORAL | Status: DC

## 2012-08-10 NOTE — Progress Notes (Signed)
Is a 78 year old woman who is 3 years post diagnosis of breast cancer and is taking a osteoporosis medicine. She notes 6 days of progressive cough, night sweats, myalgia, and maladies. She attributes some of the pulmonary symptoms to an infection possibly caused by the osteoporosis medicine since she's had this in the past after taking a dose. Her last dose of the Prolia was in August.  Patient is mildly short of breath but has no edema.  Patient reports persistent nodule left axilla following treatment for infection there with normal ultrasound last week  Objective: Patient looks acutely ill but is not cachectic HEENT: Unremarkable with the exception of mild erythema in the posterior pharynx Chest: Diffuse rhonchi and a few expiratory rales in the left base Heart: Loud systolic murmur best heard right sternal border with no bruits heard in the neck. Abdomen: Soft and nontender Skin: Warm and dry with no cyanosis or rash [facility] reading (PRIMARY) by  Dr. Milus Glazier:  CXR.  Hazy LLL, osteoporosis, healed left humeral head impaction fx, old surgical clips left chest. Results for orders placed in visit on 08/10/12  POCT CBC      Component Value Range   WBC 6.4  4.6 - 10.2 K/uL   Lymph, poc 1.7  0.6 - 3.4   POC LYMPH PERCENT 26.6  10 - 50 %L   MID (cbc) 0.6  0 - 0.9   POC MID % 9.0  0 - 12 %M   POC Granulocyte 4.1  2 - 6.9   Granulocyte percent 64.4  37 - 80 %G   RBC 4.48  4.04 - 5.48 M/uL   Hemoglobin 11.8 (*) 12.2 - 16.2 g/dL   HCT, POC 96.0  45.4 - 47.9 %   MCV 89.6  80 - 97 fL   MCH, POC 26.3 (*) 27 - 31.2 pg   MCHC 29.4 (*) 31.8 - 35.4 g/dL   RDW, POC 09.8     Platelet Count, POC 232  142 - 424 K/uL   MPV 10.4  0 - 99.8 fL   Assessment:  Question of early pneumonia LLL, thrush   1. Cough  DG Chest 2 View, POCT CBC, moxifloxacin (AVELOX) 400 MG tablet  2. Thrush  fluconazole (DIFLUCAN) 150 MG tablet   Stop the Prolia for now.

## 2012-08-18 ENCOUNTER — Other Ambulatory Visit: Payer: Self-pay

## 2012-08-20 ENCOUNTER — Telehealth: Payer: Self-pay

## 2012-08-20 NOTE — Telephone Encounter (Signed)
Daughter called wanted to check on labs.  Advised her the only labs done were a CBC in office.  Daughter states Mom's temp has been low at 95ish, also that it is taking her awhile to bounce back.  Advised her that with age it can take longer to get back to normal but we would be happy to see her again.  Please advise

## 2012-08-20 NOTE — Telephone Encounter (Signed)
Please have patient return over the weekend with any worsening or failure to iprove

## 2012-08-23 NOTE — Telephone Encounter (Signed)
I have spoken to her daughter and she feels better now.

## 2012-09-17 ENCOUNTER — Ambulatory Visit (HOSPITAL_BASED_OUTPATIENT_CLINIC_OR_DEPARTMENT_OTHER): Payer: Medicare Other | Admitting: Oncology

## 2012-09-17 ENCOUNTER — Other Ambulatory Visit (HOSPITAL_BASED_OUTPATIENT_CLINIC_OR_DEPARTMENT_OTHER): Payer: Medicare Other | Admitting: Lab

## 2012-09-17 ENCOUNTER — Encounter: Payer: Self-pay | Admitting: Oncology

## 2012-09-17 ENCOUNTER — Telehealth: Payer: Self-pay | Admitting: Oncology

## 2012-09-17 VITALS — BP 124/78 | HR 64 | Temp 97.8°F | Resp 20 | Ht 62.5 in | Wt 110.0 lb

## 2012-09-17 DIAGNOSIS — C50919 Malignant neoplasm of unspecified site of unspecified female breast: Secondary | ICD-10-CM

## 2012-09-17 DIAGNOSIS — M810 Age-related osteoporosis without current pathological fracture: Secondary | ICD-10-CM

## 2012-09-17 DIAGNOSIS — Z170 Estrogen receptor positive status [ER+]: Secondary | ICD-10-CM

## 2012-09-17 LAB — CBC WITH DIFFERENTIAL/PLATELET
BASO%: 0.5 % (ref 0.0–2.0)
Basophils Absolute: 0 10*3/uL (ref 0.0–0.1)
EOS%: 3.1 % (ref 0.0–7.0)
Eosinophils Absolute: 0.2 10*3/uL (ref 0.0–0.5)
HCT: 39 % (ref 34.8–46.6)
HGB: 12.8 g/dL (ref 11.6–15.9)
LYMPH%: 18.9 % (ref 14.0–49.7)
MCH: 29 pg (ref 25.1–34.0)
MCHC: 32.8 g/dL (ref 31.5–36.0)
MCV: 88.2 fL (ref 79.5–101.0)
MONO#: 0.5 10*3/uL (ref 0.1–0.9)
MONO%: 7.4 % (ref 0.0–14.0)
NEUT#: 4.9 10*3/uL (ref 1.5–6.5)
NEUT%: 70.1 % (ref 38.4–76.8)
Platelets: 218 10*3/uL (ref 145–400)
RBC: 4.42 10*6/uL (ref 3.70–5.45)
RDW: 17.2 % — ABNORMAL HIGH (ref 11.2–14.5)
WBC: 7 10*3/uL (ref 3.9–10.3)
lymph#: 1.3 10*3/uL (ref 0.9–3.3)

## 2012-09-17 LAB — COMPREHENSIVE METABOLIC PANEL (CC13)
ALT: 15 U/L (ref 0–55)
AST: 22 U/L (ref 5–34)
Albumin: 3.7 g/dL (ref 3.5–5.0)
Alkaline Phosphatase: 89 U/L (ref 40–150)
BUN: 24 mg/dL (ref 7.0–26.0)
CO2: 26 mEq/L (ref 22–29)
Calcium: 9.7 mg/dL (ref 8.4–10.4)
Chloride: 102 mEq/L (ref 98–107)
Creatinine: 1.1 mg/dL (ref 0.6–1.1)
Glucose: 99 mg/dl (ref 70–99)
Potassium: 4.5 mEq/L (ref 3.5–5.1)
Sodium: 140 mEq/L (ref 136–145)
Total Bilirubin: 0.42 mg/dL (ref 0.20–1.20)
Total Protein: 7 g/dL (ref 6.4–8.3)

## 2012-09-17 MED ORDER — TAMOXIFEN CITRATE 20 MG PO TABS: 20.0000 mg | ORAL_TABLET | Freq: Every day | ORAL | Status: DC

## 2012-09-17 MED ORDER — ALENDRONATE SODIUM 70 MG PO TABS: 70.0000 mg | ORAL_TABLET | ORAL | Status: DC

## 2012-09-17 NOTE — Telephone Encounter (Deleted)
s/w wife and she is aware that cen sch will be calling with the pet appt    anne

## 2012-09-17 NOTE — Telephone Encounter (Signed)
Other notesERROR appts made and printed for pt aom

## 2012-09-17 NOTE — Patient Instructions (Addendum)
Results for LAJOY, VANAMBURG (MRN [id number]) as of 09/17/2012 09:29  Ref. Range 09/17/2012 09:01  WBC Latest Range: 4.6-10.2 K/uL 7.0  RBC Latest Range: 4.04-5.48 M/uL 4.42  Hemoglobin Latest Range: 12.0-16.0 g/dL 56.2  HCT Latest Range: 36-46 % 39.0  MCV Latest Range: 80-97 fL 88.2  MCH Latest Range: 27-31.2 pg 29.0  MCHC Latest Range: 31.8-35.4 g/dL 13.0  RDW Latest Range: 11.5-15.5 % 17.2 (H)  Platelets Latest Range: 150-399 K/L 218

## 2012-09-17 NOTE — Progress Notes (Signed)
[facility]  Telephone:([phone] Fax:(336) [phone]   OFFICE PROGRESS NOTE   Cc:  DAUB, STEVE A, MD  DIAGNOSIS:   History of T1 N0 M0;  measured 10 x 6 x 10 mm on mammogram, invasive lobular carcinoma, low grade, estrogen receptor 83%, progesterone receptor 63%, Ki-67 of 6%, HER2/neu by [facility] was negative with ratio of 1.21.   PAST THERAPY: Status post left breast lumpectomy on August 27, 2009, with negative margins and no lymph node biopsy given her symptoms. She is also status post adjuvant radiation therapy.   CURRENT THERAPY: Started adjuvant hormonal therapy utilizing anastrozole in January 2011.  She is also on Prolia SQ every 6 months for osteoporosis.   INTERVAL HISTORY: [name] 78 y.o. female returns for regular follow up. Patient states that she think the Prolia is causing side effects including yeast infections and an abscess under her left arm. This area was biopsied and was benign. She no longer wants to take Prolia. She is taking anastrozole with only mild hot flash/night sweat.  She has baseline mild arthralgia which has not worsened with anastrazole.  She denies any breast abnormality.   Patient denies fatigue, headache, visual changes, confusion, drenching night sweats, palpable lymph node swelling, mucositis, odynophagia, dysphagia, nausea vomiting, jaundice, chest pain, palpitation, shortness of breath, dyspnea on exertion, productive cough, gum bleeding, epistaxis, hematemesis, hemoptysis, abdominal pain, abdominal swelling, early satiety, melena, hematochezia, hematuria, skin rash, spontaneous bleeding, joint swelling, heat or cold intolerance, bowel bladder incontinence, back pain, focal motor weakness, paresthesia.    Past Medical History  Diagnosis Date  . Autoimmune disease   . Dysphagia   . Hyperlipidemia   . Breast cancer 2010    cT1 N0 M0; ER+/ PR+/ HER2 neg; s/p lumpectomy 07/2010; started adjuvant hormonal therapy 10/2009  .  Barrett's esophagus   . Osteopenia   . Pneumonia   . Cervical cancer   . Neuromuscular disorder   . Hypertension   . Glaucoma(365)   . Bone lesion     sacrum  . Osteoporosis   . Osteoporosis 03/18/2012  . Humerus fracture 03/2012    impacted left humueral neck fracture, treated by Dr. Amanda Pea  . Fx distal radius NEC-closed   . Fracture of left shoulder     Past Surgical History  Procedure Date  . Hand surgery   . Ovarian cyst removal 1978  . Appendectomy   . Breast lumpectomy   . Ortho procedures     multiple  . Abdominal hysterectomy 1972    cancer          1 tube 1 ovary  . Shoulder surgery 1954    left  . Colon polyp removal     Current Outpatient Prescriptions  Medication Sig Dispense Refill  . Calcium Carbonate-Vitamin D (CALCIUM + D PO) Take 1 capsule by mouth 3 (three) times daily.       . cloNIDine (CATAPRES) 0.1 MG tablet Take 1 tablet (0.1 mg total) by mouth daily.  90 tablet  3  . ibuprofen (ADVIL,MOTRIN) 200 MG tablet Take 200 mg by mouth every 6 (six) hours as needed.        . latanoprost (XALATAN) 0.005 % ophthalmic solution Place 1 drop into both eyes at bedtime.        Marland Kitchen omeprazole (PRILOSEC) 20 MG capsule Take 20 mg by mouth daily.        . rosuvastatin (CRESTOR) 10 MG tablet Take 1 tablet (10 mg total) by  mouth daily.  90 tablet  3  . alendronate (FOSAMAX) 70 MG tablet Take 1 tablet (70 mg total) by mouth every 7 (seven) days. Take with a full glass of water on an empty stomach.  12 tablet  3  . tamoxifen (NOLVADEX) 20 MG tablet Take 1 tablet (20 mg total) by mouth daily.  90 tablet  3  . [DISCONTINUED] Rosuvastatin Calcium (CRESTOR PO) Take 10 mg by mouth daily.         ALLERGIES:  is allergic to daypro.  REVIEW OF SYSTEMS:  The rest of the 14-point review of system was negative.   Filed Vitals:   09/17/12 0930  BP: 124/78  Pulse: 64  Temp: 97.8 F (36.6 C)  Resp: 20   Wt Readings from Last 3 Encounters:  09/17/12 110 lb (49.896 kg)  08/10/12  108 lb (48.988 kg)  06/22/12 109 lb (49.442 kg)   ECOG Performance status: 1  PHYSICAL EXAMINATION:   General:  Thin-appearing woman, in no acute distress.  Eyes:  no scleral icterus.  ENT:  There were no oropharyngeal lesions.  Neck was without thyromegaly.  Lymphatics:  Negative cervical, supraclavicular or axillary adenopathy.  Respiratory: lungs were clear bilaterally without wheezing or crackles.  Cardiovascular:  Regular rate and rhythm, S1/S2, without murmur, rub or gallop.  There was no pedal edema.  GI:  abdomen was soft, flat, nontender, nondistended, without organomegaly.  Muscoloskeletal:  no spinal tenderness of palpation of vertebral spine.  Her left arm was in a sling.  Skin exam was without echymosis, petichae.  Neuro exam was nonfocal.  Patient was able to get on and off exam table without assistance.  Gait was normal.  Patient was alerted and oriented.  Attention was good.   Language was appropriate.  Mood was normal without depression.  Speech was not pressured.  Thought content was not tangential.  Bilateral breast exam was negative for palpable mass, skin thickening, erythema, purulent discharge.    LABORATORY/RADIOLOGY DATA:  Lab Results  Component Value Date   WBC 7.0 09/17/2012   HGB 12.8 09/17/2012   HCT 39.0 09/17/2012   PLT 218 09/17/2012   GLUCOSE 99 09/17/2012   CHOL 171 07/09/2011   TRIG 65 07/09/2011   HDL 71* 07/09/2011   LDLCALC 87 07/09/2011   ALKPHOS 89 09/17/2012   ALT 15 09/17/2012   AST 22 09/17/2012   NA 140 09/17/2012   K 4.5 09/17/2012   CL 102 09/17/2012   CREATININE 1.1 09/17/2012   BUN 24.0 09/17/2012   CO2 26 09/17/2012   INR 1.06 11/29/2009    ASSESSMENT AND PLAN:   1. History of breast cancer:  There is no evidence of recurrence or metastatic disease on clinical history, physical exam, lab.  She is not able to tolerate Prolia for her osteoporosis. I have given her the option of switching to Tamoxifen or staying on the anastrozole.  The anastrozole may  continue to worsen her osteoporosis. She would like to switch to Tamoxifen 20 mg daily. Side effects reviewed including, but not limited to, hot flashes, increased incidence of endometrial ca, and increased incidence of DVT. 2. Osteoporosis: She is on calcium, vitamin D and every 6 months Prolia.  She does not want Prolia due to side effects. Will stop this medication and change to Fosamax 70 mg weekly. 3. Followup.  [facility] with [facility] in [facility]/2013.  Next bone density scan is in Jan 2015.  Visit in about 6 months.  The length of time of the face-to-face encounter was 30 minutes. More than 50% of time was spent counseling and coordination of care.

## 2012-09-17 NOTE — Telephone Encounter (Deleted)
appts made and printed for pt aom °

## 2012-09-28 ENCOUNTER — Telehealth: Payer: Self-pay | Admitting: Oncology

## 2012-09-29 NOTE — Telephone Encounter (Signed)
Return call to daughter to explain switch from AI to Tamoxifen. Discussed that patient was given an option to stay on AI and add Alendronate versus switching to Tamoxifen and adding Alendronate. Patient decided to switch. Daughter states patient is reluctant to take Tamoxifen due to a family member having recurrence of her breast cancer while on this medication. Told daughter that patient does not need to switch.

## 2012-11-14 ENCOUNTER — Other Ambulatory Visit: Payer: Self-pay | Admitting: *Deleted

## 2012-11-14 MED ORDER — CLONIDINE HCL 0.1 MG PO TABS: 0.1000 mg | ORAL_TABLET | Freq: Every day | ORAL | Status: DC

## 2012-12-07 ENCOUNTER — Ambulatory Visit (INDEPENDENT_AMBULATORY_CARE_PROVIDER_SITE_OTHER): Payer: Medicare Other | Admitting: Emergency Medicine

## 2012-12-07 ENCOUNTER — Encounter: Payer: Self-pay | Admitting: Emergency Medicine

## 2012-12-07 VITALS — BP 160/77 | HR 56 | Temp 97.4°F | Resp 16 | Ht 62.5 in | Wt 110.0 lb

## 2012-12-07 DIAGNOSIS — K224 Dyskinesia of esophagus: Secondary | ICD-10-CM

## 2012-12-07 DIAGNOSIS — C50919 Malignant neoplasm of unspecified site of unspecified female breast: Secondary | ICD-10-CM

## 2012-12-07 DIAGNOSIS — M810 Age-related osteoporosis without current pathological fracture: Secondary | ICD-10-CM

## 2012-12-07 DIAGNOSIS — E785 Hyperlipidemia, unspecified: Secondary | ICD-10-CM

## 2012-12-07 DIAGNOSIS — I10 Essential (primary) hypertension: Secondary | ICD-10-CM

## 2012-12-07 LAB — COMPREHENSIVE METABOLIC PANEL
ALT: 12 U/L (ref 0–35)
AST: 19 U/L (ref 0–37)
Albumin: 3.9 g/dL (ref 3.5–5.2)
Alkaline Phosphatase: 80 U/L (ref 39–117)
BUN: 20 mg/dL (ref 6–23)
CO2: 25 mEq/L (ref 19–32)
Calcium: 9.4 mg/dL (ref 8.4–10.5)
Chloride: 104 mEq/L (ref 96–112)
Creat: 0.94 mg/dL (ref 0.50–1.10)
Glucose, Bld: 88 mg/dL (ref 70–99)
Potassium: 4.3 mEq/L (ref 3.5–5.3)
Sodium: 140 mEq/L (ref 135–145)
Total Bilirubin: 0.5 mg/dL (ref 0.3–1.2)
Total Protein: 6.6 g/dL (ref 6.0–8.3)

## 2012-12-07 LAB — CBC WITH DIFFERENTIAL/PLATELET
Basophils Absolute: 0 10*3/uL (ref 0.0–0.1)
Basophils Relative: 0 % (ref 0–1)
Eosinophils Absolute: 0.2 10*3/uL (ref 0.0–0.7)
Eosinophils Relative: 4 % (ref 0–5)
HCT: 37 % (ref 36.0–46.0)
Hemoglobin: 12.1 g/dL (ref 12.0–15.0)
Lymphocytes Relative: 25 % (ref 12–46)
Lymphs Abs: 1.3 10*3/uL (ref 0.7–4.0)
MCH: 28.5 pg (ref 26.0–34.0)
MCHC: 32.7 g/dL (ref 30.0–36.0)
MCV: 87.1 fL (ref 78.0–100.0)
Monocytes Absolute: 0.4 10*3/uL (ref 0.1–1.0)
Monocytes Relative: 7 % (ref 3–12)
Neutro Abs: 3.5 10*3/uL (ref 1.7–7.7)
Neutrophils Relative %: 64 % (ref 43–77)
Platelets: 243 10*3/uL (ref 150–400)
RBC: 4.25 MIL/uL (ref 3.87–5.11)
RDW: 16.1 % — ABNORMAL HIGH (ref 11.5–15.5)
WBC: 5.4 10*3/uL (ref 4.0–10.5)

## 2012-12-07 LAB — LIPID PANEL
Cholesterol: 162 mg/dL (ref 0–200)
HDL: 72 mg/dL (ref >39)
LDL Cholesterol: 81 mg/dL (ref 0–99)
Total CHOL/HDL Ratio: 2.3 Ratio
Triglycerides: 46 mg/dL (ref <150)
VLDL: 9 mg/dL (ref 0–40)

## 2012-12-07 NOTE — Progress Notes (Signed)
  Subjective:    Patient ID: [name], female    DOB: [birth year], 79 y.o.   MRN: [id number]  HPI problem #1 hyperlipidemia. She continues on her medications for this. Problem #2 breast cancer she has had no signs of recurrent and is seen by the oncologist a regular basis. Problem #3 esophagitis and esophageal spasm she is scheduled to see the GI specialist in the near future and is currently on Fosamax and tolerating it fairly well. Problem #4 elevated blood pressure her pressures today are 160/77 she states they have been normal at home she is just excited about being here today.    Review of Systems     Objective:   Physical Exam HEENT exam is unremarkable. Her neck is supple chest clear. Heart regular leg no murmurs. Soft nontender.        Assessment & Plan:  She has clonidine at home to take for blood pressure as needed. She took this in the past for esophageal spasms and tolerated it without difficulty but she states her pressures are normal at home so I'm not instituted this for therapy. Recheck 6 months

## 2012-12-08 ENCOUNTER — Encounter: Payer: Self-pay | Admitting: *Deleted

## 2013-01-04 ENCOUNTER — Ambulatory Visit (INDEPENDENT_AMBULATORY_CARE_PROVIDER_SITE_OTHER): Payer: Medicare Other | Admitting: General Surgery

## 2013-01-04 ENCOUNTER — Encounter (INDEPENDENT_AMBULATORY_CARE_PROVIDER_SITE_OTHER): Payer: Self-pay | Admitting: General Surgery

## 2013-01-04 VITALS — BP 142/70 | HR 56 | Temp 97.2°F | Resp 16 | Ht 63.0 in | Wt 108.0 lb

## 2013-01-04 DIAGNOSIS — C50912 Malignant neoplasm of unspecified site of left female breast: Secondary | ICD-10-CM

## 2013-01-04 DIAGNOSIS — C50919 Malignant neoplasm of unspecified site of unspecified female breast: Secondary | ICD-10-CM

## 2013-01-04 NOTE — Progress Notes (Signed)
Subjective:     Patient ID: [name], female   DOB: 10-Jul-1931, 79 y.o.   MRN: [id number]  HPI The patient is a 79 year old white female who is 3 years out from a left breast lumpectomy and negative sentinel node biopsy for a T1 B. N0 left breast cancer. She is taking arimidex and tolerating that well. She denies any breast pain. She recently underwent routine screening mammogram that showed no evidence of malignancy. Over the last year her only major medical problem was a couple episodes of pneumonia but this did not require hospitalization.  Review of Systems  Constitutional: Negative.   HENT: Negative.   Eyes: Negative.   Respiratory: Negative.   Cardiovascular: Negative.   Gastrointestinal: Negative.   Endocrine: Negative.   Genitourinary: Negative.   Musculoskeletal: Negative.   Skin: Negative.   Allergic/Immunologic: Negative.   Neurological: Negative.   Hematological: Negative.   Psychiatric/Behavioral: Negative.        Objective:   Physical Exam  Constitutional: She is oriented to person, place, and time. She appears well-developed and well-nourished.  HENT:  Head: Normocephalic and atraumatic.  Eyes: Conjunctivae and EOM are normal. Pupils are equal, round, and reactive to light.  Neck: Normal range of motion. Neck supple.  Cardiovascular: Normal rate, regular rhythm and normal heart sounds.   Pulmonary/Chest: Effort normal and breath sounds normal.  There is no palpable mass in either breast. There is no palpable axillary or supraclavicular cervical lymphadenopathy.  Abdominal: Soft. Bowel sounds are normal. She exhibits no mass. There is no tenderness.  Musculoskeletal: Normal range of motion.  Lymphadenopathy:    She has no cervical adenopathy.  Neurological: She is alert and oriented to person, place, and time.  Skin: Skin is warm and dry.  Psychiatric: She has a normal mood and affect. Her behavior is normal.       Assessment:     The patient is 3 years  status post left breast lumpectomy for breast cancer     Plan:     At this point she will continue to do regular self exams. She will continue to take her antiestrogen medicine. We will plan to see her back in one year.

## 2013-01-04 NOTE — Patient Instructions (Signed)
Continue regular self exams Continue arimidex 

## 2013-01-13 ENCOUNTER — Encounter: Payer: Self-pay | Admitting: Emergency Medicine

## 2013-01-20 ENCOUNTER — Other Ambulatory Visit: Payer: Self-pay | Admitting: *Deleted

## 2013-01-20 DIAGNOSIS — C50919 Malignant neoplasm of unspecified site of unspecified female breast: Secondary | ICD-10-CM

## 2013-01-20 MED ORDER — ANASTROZOLE 1 MG PO TABS: 1.0000 mg | ORAL_TABLET | Freq: Every day | ORAL | Status: DC

## 2013-01-24 ENCOUNTER — Telehealth: Payer: Self-pay

## 2013-01-24 NOTE — Telephone Encounter (Signed)
Prime mail told patient that our office denied her rx request for   cloNIDine (CATAPRES) 0.1 MG tablet [1755]  She wants to speak to Dr. Cleta Alberts or someone about why it was denied.  Best: [phone]

## 2013-01-25 MED ORDER — CLONIDINE HCL 0.1 MG PO TABS: 0.1000 mg | ORAL_TABLET | Freq: Every day | ORAL | Status: DC

## 2013-01-25 NOTE — Addendum Note (Signed)
Addended byCaffie Damme on: 01/25/2013 08:55 AM   Modules accepted: Orders

## 2013-01-25 NOTE — Telephone Encounter (Signed)
Was not denied, we did not get request.

## 2013-01-25 NOTE — Telephone Encounter (Signed)
Patient advised this was sent in for her.

## 2013-03-18 ENCOUNTER — Other Ambulatory Visit (HOSPITAL_BASED_OUTPATIENT_CLINIC_OR_DEPARTMENT_OTHER): Payer: Medicare Other | Admitting: Lab

## 2013-03-18 ENCOUNTER — Ambulatory Visit (HOSPITAL_BASED_OUTPATIENT_CLINIC_OR_DEPARTMENT_OTHER): Payer: Medicare Other | Admitting: Oncology

## 2013-03-18 ENCOUNTER — Telehealth: Payer: Self-pay | Admitting: Oncology

## 2013-03-18 VITALS — BP 147/70 | HR 58 | Temp 97.4°F | Resp 18 | Ht 63.0 in | Wt 107.5 lb

## 2013-03-18 DIAGNOSIS — C50919 Malignant neoplasm of unspecified site of unspecified female breast: Secondary | ICD-10-CM

## 2013-03-18 DIAGNOSIS — M810 Age-related osteoporosis without current pathological fracture: Secondary | ICD-10-CM

## 2013-03-18 LAB — CBC WITH DIFFERENTIAL/PLATELET
BASO%: 0.5 % (ref 0.0–2.0)
Basophils Absolute: 0 10*3/uL (ref 0.0–0.1)
EOS%: 1.5 % (ref 0.0–7.0)
Eosinophils Absolute: 0.1 10*3/uL (ref 0.0–0.5)
HCT: 35.6 % (ref 34.8–46.6)
HGB: 12.1 g/dL (ref 11.6–15.9)
LYMPH%: 16.1 % (ref 14.0–49.7)
MCH: 29.6 pg (ref 25.1–34.0)
MCHC: 34 g/dL (ref 31.5–36.0)
MCV: 87.2 fL (ref 79.5–101.0)
MONO#: 0.5 10*3/uL (ref 0.1–0.9)
MONO%: 7.3 % (ref 0.0–14.0)
NEUT#: 5.5 10*3/uL (ref 1.5–6.5)
NEUT%: 74.6 % (ref 38.4–76.8)
Platelets: 191 10*3/uL (ref 145–400)
RBC: 4.08 10*6/uL (ref 3.70–5.45)
RDW: 16.3 % — ABNORMAL HIGH (ref 11.2–14.5)
WBC: 7.3 10*3/uL (ref 3.9–10.3)
lymph#: 1.2 10*3/uL (ref 0.9–3.3)

## 2013-03-18 LAB — COMPREHENSIVE METABOLIC PANEL (CC13)
ALT: 12 U/L (ref 0–55)
AST: 21 U/L (ref 5–34)
Albumin: 3.3 g/dL — ABNORMAL LOW (ref 3.5–5.0)
Alkaline Phosphatase: 80 U/L (ref 40–150)
BUN: 20.2 mg/dL (ref 7.0–26.0)
CO2: 25 mEq/L (ref 22–29)
Calcium: 9.6 mg/dL (ref 8.4–10.4)
Chloride: 105 mEq/L (ref 98–107)
Creatinine: 1 mg/dL (ref 0.6–1.1)
Glucose: 95 mg/dl (ref 70–99)
Potassium: 4.4 mEq/L (ref 3.5–5.1)
Sodium: 140 mEq/L (ref 136–145)
Total Bilirubin: 0.36 mg/dL (ref 0.20–1.20)
Total Protein: 6.7 g/dL (ref 6.4–8.3)

## 2013-03-18 NOTE — Progress Notes (Signed)
[facility]  [phone] [phone]   OFFICE PROGRESS NOTE   Cc:  DAUB, STEVE A, MD  DIAGNOSIS:   History of T1 N0 M0;  measured 10 x 6 x 10 mm on mammogram, invasive lobular carcinoma, low grade, estrogen receptor 83%, progesterone receptor 63%, Ki-67 of 6%, HER2/neu by [facility] was negative with ratio of 1.21.   PAST THERAPY: Status post left breast lumpectomy on August 27, 2009, with negative margins and no lymph node biopsy given her symptoms. She is also status post adjuvant radiation therapy.   CURRENT THERAPY: Started adjuvant hormonal therapy with anastrozole in January 2011.    INTERVAL HISTORY: [name] 79 y.o. female returns for regular follow up by herself.  She reported since stopping Prolia, she has not had any recurrent yeast infection, hot flash, night sweat, pneumonia.  She is convinced that Prolia caused these side effects.  She has been taking Arimidex without bone/muscle pain.  She is also taking Fosamax and Calcium/Vit D.  She denied any palpable breast mass, pain, node swelling.   Patient denies fever, anorexia, weight loss, fatigue, headache, visual changes, confusion, drenching night sweats, palpable lymph node swelling, mucositis, odynophagia, dysphagia, nausea vomiting, jaundice, chest pain, palpitation, shortness of breath, dyspnea on exertion, productive cough, gum bleeding, epistaxis, hematemesis, hemoptysis, abdominal pain, abdominal swelling, early satiety, melena, hematochezia, hematuria, skin rash, spontaneous bleeding, joint swelling, joint pain, heat or cold intolerance, bowel bladder incontinence, back pain, focal motor weakness, paresthesia, depression.    Past Medical History  Diagnosis Date  . Autoimmune disease   . Dysphagia   . Hyperlipidemia   . Breast cancer 2010    cT1 N0 M0; ER+/ PR+/ HER2 neg; s/p lumpectomy 07/2010; started adjuvant hormonal therapy 10/2009  . Barrett's esophagus   . Osteopenia   .  Pneumonia   . Cervical cancer   . Neuromuscular disorder   . Hypertension   . Glaucoma(365)   . Bone lesion     sacrum  . Osteoporosis   . Osteoporosis 03/18/2012  . Humerus fracture 03/2012    impacted left humueral neck fracture, treated by Dr. Amanda Pea  . Fx distal radius NEC-closed   . Fracture of left shoulder     Past Surgical History  Procedure Laterality Date  . Hand surgery    . Ovarian cyst removal  1978  . Appendectomy    . Breast lumpectomy    . Ortho procedures      multiple  . Abdominal hysterectomy  1972    cancer          1 tube 1 ovary  . Shoulder surgery  1954    left  . Colon polyp removal      Current Outpatient Prescriptions  Medication Sig Dispense Refill  . alendronate (FOSAMAX) 70 MG tablet Take 1 tablet (70 mg total) by mouth every 7 (seven) days. Take with a full glass of water on an empty stomach.  12 tablet  3  . anastrozole (ARIMIDEX) 1 MG tablet Take 1 tablet (1 mg total) by mouth daily.  90 tablet  0  . Calcium Carbonate-Vitamin D (CALCIUM + D PO) Take 1 capsule by mouth 3 (three) times daily.       . cloNIDine (CATAPRES) 0.1 MG tablet Take 1 tablet (0.1 mg total) by mouth daily.  90 tablet  1  . ibuprofen (ADVIL,MOTRIN) 200 MG tablet Take 200 mg by mouth every 6 (six) hours as needed.        Marland Kitchen  latanoprost (XALATAN) 0.005 % ophthalmic solution Place 1 drop into both eyes at bedtime.        Marland Kitchen omeprazole (PRILOSEC) 20 MG capsule Take 20 mg by mouth daily.        . rosuvastatin (CRESTOR) 10 MG tablet Take 1 tablet (10 mg total) by mouth daily.  90 tablet  3   No current facility-administered medications for this visit.    ALLERGIES:  is allergic to daypro.  REVIEW OF SYSTEMS:  The rest of the 14-point review of system was negative.   Filed Vitals:   03/18/13 0912  BP: 147/70  Pulse: 58  Temp: 97.4 F (36.3 C)  Resp: 18   Wt Readings from Last 3 Encounters:  03/18/13 107 lb 8 oz (48.762 kg)  01/04/13 108 lb (48.988 kg)  12/07/12 110 lb  (49.896 kg)   ECOG Performance status: 1  PHYSICAL EXAMINATION:   General:  Thin-appearing woman, in no acute distress.  Eyes:  no scleral icterus.  ENT:  There were no oropharyngeal lesions.  Neck was without thyromegaly.  Lymphatics:  Negative cervical, supraclavicular or axillary adenopathy.  Respiratory: lungs were clear bilaterally without wheezing or crackles.  Cardiovascular:  Regular rate and rhythm, S1/S2, without murmur, rub or gallop.  There was no pedal edema.  GI:  abdomen was soft, flat, nontender, nondistended, without organomegaly.  Muscoloskeletal:  no spinal tenderness of palpation of vertebral spine.  Her left arm was in a sling.  Skin exam was without echymosis, petichae.  Neuro exam was nonfocal.  Patient was able to get on and off exam table without assistance.  Gait was normal.  Patient was alert and oriented.  Attention was good.   Language was appropriate.  Mood was normal without depression.  Speech was not pressured.  Thought content was not tangential.  Bilateral breast exam was negative for palpable mass, skin thickening, erythema, purulent discharge.    LABORATORY/RADIOLOGY DATA:  Lab Results  Component Value Date   WBC 7.3 03/18/2013   HGB 12.1 03/18/2013   HCT 35.6 03/18/2013   PLT 191 03/18/2013   GLUCOSE 95 03/18/2013   CHOL 162 12/07/2012   TRIG 46 12/07/2012   HDL 72 12/07/2012   LDLCALC 81 12/07/2012   ALKPHOS 80 03/18/2013   ALT 12 03/18/2013   AST 21 03/18/2013   NA 140 03/18/2013   K 4.4 03/18/2013   CL 105 03/18/2013   CREATININE 1.0 03/18/2013   BUN 20.2 03/18/2013   CO2 25 03/18/2013   INR 1.06 11/29/2009    ASSESSMENT AND PLAN:   1. History of breast cancer:  She continues to be in remission. She prefers to take Arimidex over Tamoxifen. I agreed with this decision since AI is slightly better than tamoxifen in patients with lower PR%.  Goal is about 5 years of AI.  Given her osteoporosis, I do not endorse more than 5 years of AI.  If she wants longer therapy, we may  consider switching her to Tamoxifen after 5 years of AI.  2. Osteoporosis: She is on calcium, vitamin D.  She could not tolerate Prolia due to hot flash, recurrent yeast infection.  I am not convinced that recurrent yeast infection can be attributed to Prolia.  Regardless, she does not want to take Prolia.  She tolerates Fosamax well.   3. Followup.  [facility].  Next bone density scan is in Jan 2015.  Visit in about 6 months.     The  length of time of the face-to-face encounter was 15 minutes. More than 50% of time was spent counseling and coordination of care.     Huan T. Gaylyn Rong, M.D.

## 2013-03-18 NOTE — Telephone Encounter (Signed)
gv and printed appt sched and aavs for pt.

## 2013-03-29 ENCOUNTER — Other Ambulatory Visit: Payer: Self-pay

## 2013-04-19 ENCOUNTER — Other Ambulatory Visit: Payer: Self-pay

## 2013-04-19 DIAGNOSIS — C50919 Malignant neoplasm of unspecified site of unspecified female breast: Secondary | ICD-10-CM

## 2013-04-19 MED ORDER — ANASTROZOLE 1 MG PO TABS
1.0000 mg | ORAL_TABLET | Freq: Every day | ORAL | Status: DC
Start: 2013-04-19 — End: 2014-08-14

## 2013-05-17 ENCOUNTER — Ambulatory Visit (INDEPENDENT_AMBULATORY_CARE_PROVIDER_SITE_OTHER): Payer: Medicare Other | Admitting: Emergency Medicine

## 2013-05-17 ENCOUNTER — Encounter: Payer: Self-pay | Admitting: Emergency Medicine

## 2013-05-17 ENCOUNTER — Ambulatory Visit: Payer: Medicare Other

## 2013-05-17 VITALS — BP 125/77 | HR 65 | Temp 98.3°F | Resp 16 | Ht 62.0 in | Wt 106.8 lb

## 2013-05-17 DIAGNOSIS — C50919 Malignant neoplasm of unspecified site of unspecified female breast: Secondary | ICD-10-CM

## 2013-05-17 DIAGNOSIS — R109 Unspecified abdominal pain: Secondary | ICD-10-CM

## 2013-05-17 DIAGNOSIS — M8448XA Pathological fracture, other site, initial encounter for fracture: Secondary | ICD-10-CM

## 2013-05-17 DIAGNOSIS — M4856XA Collapsed vertebra, not elsewhere classified, lumbar region, initial encounter for fracture: Secondary | ICD-10-CM

## 2013-05-17 DIAGNOSIS — Z79899 Other long term (current) drug therapy: Secondary | ICD-10-CM

## 2013-05-17 DIAGNOSIS — E785 Hyperlipidemia, unspecified: Secondary | ICD-10-CM

## 2013-05-17 LAB — LIPID PANEL
Cholesterol: 163 mg/dL (ref 0–200)
HDL: 68 mg/dL (ref >39)
LDL Cholesterol: 80 mg/dL (ref 0–99)
Total CHOL/HDL Ratio: 2.4 Ratio
Triglycerides: 77 mg/dL (ref <150)
VLDL: 15 mg/dL (ref 0–40)

## 2013-05-17 LAB — CBC WITH DIFFERENTIAL/PLATELET
Basophils Absolute: 0 10*3/uL (ref 0.0–0.1)
Basophils Relative: 0 % (ref 0–1)
Eosinophils Absolute: 0.2 10*3/uL (ref 0.0–0.7)
Eosinophils Relative: 2 % (ref 0–5)
HCT: 36.8 % (ref 36.0–46.0)
Hemoglobin: 12.7 g/dL (ref 12.0–15.0)
Lymphocytes Relative: 21 % (ref 12–46)
Lymphs Abs: 1.6 10*3/uL (ref 0.7–4.0)
MCH: 29.6 pg (ref 26.0–34.0)
MCHC: 34.5 g/dL (ref 30.0–36.0)
MCV: 85.8 fL (ref 78.0–100.0)
Monocytes Absolute: 0.5 10*3/uL (ref 0.1–1.0)
Monocytes Relative: 7 % (ref 3–12)
Neutro Abs: 5.5 10*3/uL (ref 1.7–7.7)
Neutrophils Relative %: 70 % (ref 43–77)
Platelets: 297 10*3/uL (ref 150–400)
RBC: 4.29 MIL/uL (ref 3.87–5.11)
RDW: 15.9 % — ABNORMAL HIGH (ref 11.5–15.5)
WBC: 7.8 10*3/uL (ref 4.0–10.5)

## 2013-05-17 LAB — COMPREHENSIVE METABOLIC PANEL
ALT: 14 U/L (ref 0–35)
AST: 20 U/L (ref 0–37)
Albumin: 3.9 g/dL (ref 3.5–5.2)
Alkaline Phosphatase: 86 U/L (ref 39–117)
BUN: 22 mg/dL (ref 6–23)
CO2: 29 mEq/L (ref 19–32)
Calcium: 10 mg/dL (ref 8.4–10.5)
Chloride: 103 mEq/L (ref 96–112)
Creat: 0.95 mg/dL (ref 0.50–1.10)
Glucose, Bld: 90 mg/dL (ref 70–99)
Potassium: 4.6 mEq/L (ref 3.5–5.3)
Sodium: 139 mEq/L (ref 135–145)
Total Bilirubin: 0.4 mg/dL (ref 0.3–1.2)
Total Protein: 6.7 g/dL (ref 6.0–8.3)

## 2013-05-17 MED ORDER — ROSUVASTATIN CALCIUM 10 MG PO TABS: 10.0000 mg | ORAL_TABLET | Freq: Every day | ORAL | Status: DC

## 2013-05-17 NOTE — Progress Notes (Addendum)
  Subjective:    Patient ID: [name], female    DOB: [birth year], 79 y.o.   MRN: [id number]  HPI  79 year old presents for regular check up.  Everything seems to be going good right now.  Patient is experincing some lower abdominal pain.  Usually hits every morning and when she would turn over in the bed at night it will hurt.  Had this previously but started back about a month ago.  Pain is fine during the day.  It feels like it is in the lower sides of abdomen  Passing water and having regular bowel movements.  Has a colonoscopy scheduled the end of this year.  Grabs her as she goes to get up.  Has not noticed any changes in gastro  Had a cancerous place removed off of left lower leg.  It was a squamous cell.  Doing good with the ticks.  clonodine is helping with this.      Review of Systems     Objective:   Physical Exam       [facility] reading (PRIMARY) by  Dr Cleta Alberts there is an L1 compression fracture. There is significant multilevel osteoporosis. There are no lytic lesions seen in the pelvis.   Assessment & Plan:

## 2013-05-20 ENCOUNTER — Ambulatory Visit
Admission: RE | Admit: 2013-05-20 | Discharge: 2013-05-20 | Disposition: A | Discharge status: Home / Self Care | Payer: Medicare Other | Source: Ambulatory Visit | Attending: Emergency Medicine | Admitting: Emergency Medicine

## 2013-05-20 DIAGNOSIS — R109 Unspecified abdominal pain: Secondary | ICD-10-CM

## 2013-05-20 MED ORDER — IOHEXOL 300 MG/ML  SOLN
100.0000 mL | Freq: Once | INTRAMUSCULAR | Status: AC | PRN
  Administered 2013-05-20: 100 mL via INTRAVENOUS

## 2013-05-24 ENCOUNTER — Ambulatory Visit: Payer: Medicare Other | Admitting: Emergency Medicine

## 2013-07-04 ENCOUNTER — Other Ambulatory Visit: Payer: Self-pay | Admitting: Emergency Medicine

## 2013-07-04 DIAGNOSIS — Z853 Personal history of malignant neoplasm of breast: Secondary | ICD-10-CM

## 2013-07-13 ENCOUNTER — Ambulatory Visit (INDEPENDENT_AMBULATORY_CARE_PROVIDER_SITE_OTHER): Payer: Medicare Other | Admitting: *Deleted

## 2013-07-13 DIAGNOSIS — Z23 Encounter for immunization: Secondary | ICD-10-CM

## 2013-08-02 ENCOUNTER — Other Ambulatory Visit: Payer: Self-pay | Admitting: Emergency Medicine

## 2013-08-02 ENCOUNTER — Other Ambulatory Visit: Payer: Self-pay

## 2013-08-02 DIAGNOSIS — Z853 Personal history of malignant neoplasm of breast: Secondary | ICD-10-CM

## 2013-08-05 ENCOUNTER — Ambulatory Visit
Admission: RE | Admit: 2013-08-05 | Discharge: 2013-08-05 | Disposition: A | Discharge status: Home / Self Care | Payer: Medicare Other | Source: Ambulatory Visit | Attending: Emergency Medicine | Admitting: Emergency Medicine

## 2013-08-05 DIAGNOSIS — Z853 Personal history of malignant neoplasm of breast: Secondary | ICD-10-CM

## 2013-08-17 ENCOUNTER — Ambulatory Visit: Payer: Medicare Other

## 2013-08-17 ENCOUNTER — Ambulatory Visit (INDEPENDENT_AMBULATORY_CARE_PROVIDER_SITE_OTHER): Payer: Medicare Other | Admitting: Emergency Medicine

## 2013-08-17 VITALS — BP 146/82 | HR 55 | Temp 97.4°F | Resp 18 | Wt 109.0 lb

## 2013-08-17 DIAGNOSIS — M25572 Pain in left ankle and joints of left foot: Secondary | ICD-10-CM

## 2013-08-17 DIAGNOSIS — M25579 Pain in unspecified ankle and joints of unspecified foot: Secondary | ICD-10-CM

## 2013-08-17 NOTE — Progress Notes (Signed)
Subjective:    Patient ID: [name], female    DOB: 1930/11/24, 79 y.o.   MRN: [id number]  HPI This chart was scribed for S by Ladona Ridgel Day, Scribe. This patient was seen in room 1 and the patient's care was started at 12:07 PM.  HPI Comments: [name] is a 79 y.o. female who lives at home and today complains of left foot pain.  Today she presents to the [facility] complaining of left lateral foot pain, onset 3 days ago and denies any acute injury to her foot/ankle. She reports pain began while walking in her kitchen and felt as if she stepped on her foot wrong and has pain w/ambulation or bearing weight ever since over the past 3 days. She has not yet tried anything for this problem. She states pain over the 5th metatarsal and the plantar aspect of her left foot. She states the pain sometimes radiates up her left lower leg.   Past Medical History  Diagnosis Date  . Autoimmune disease   . Dysphagia   . Hyperlipidemia   . Breast cancer 2010    cT1 N0 M0; ER+/ PR+/ HER2 neg; s/p lumpectomy 07/2010; started adjuvant hormonal therapy 10/2009  . Barrett's esophagus   . Osteopenia   . Pneumonia   . Cervical cancer   . Neuromuscular disorder   . Hypertension   . Glaucoma   . Bone lesion     sacrum  . Osteoporosis   . Osteoporosis 03/18/2012  . Humerus fracture 03/2012    impacted left humueral neck fracture, treated by Dr. Amanda Pea  . Fx distal radius NEC-closed   . Fracture of left shoulder     Past Surgical History  Procedure Laterality Date  . Hand surgery    . Ovarian cyst removal  1978  . Appendectomy    . Breast lumpectomy    . Ortho procedures      multiple  . Abdominal hysterectomy  1972    cancer          1 tube 1 ovary  . Shoulder surgery  1954    left  . Colon polyp removal      Family History  Problem Relation Age of Onset  . Heart disease Father   . Cancer Sister     lung, kidney  . Cancer Brother     brain  . Cancer Paternal Aunt      breast  . Cancer Brother     liver, lung    History   Social History  . Marital Status: Married    Spouse Name: N/A    Number of Children: N/A  . Years of Education: N/A   Occupational History  . Not on file.   Social History Main Topics  . Smoking status: Former Smoker    Quit date: 12/16/1986  . Smokeless tobacco: Never Used     Comment: FORMER SMOKER 30 YEARS AGO  . Alcohol Use: Yes     Comment: OCCASIONALLY  . Drug Use: Not on file  . Sexual Activity: Not on file   Other Topics Concern  . Not on file   Social History Narrative  . No narrative on file    Allergies  Allergen Reactions  . Daypro [Oxaprozin] Swelling    Face and tongue    Patient Active Problem List   Diagnosis Date Noted  . Trigger finger 07/08/2012  . Osteoporosis 03/18/2012  . Fracture, shoulder 03/06/2012  . Hyperlipidemia   .  Breast cancer     Results for orders placed in visit on 05/17/13  CBC WITH DIFFERENTIAL      Result Value Range   WBC 7.8  4.0 - 10.5 K/uL   RBC 4.29  3.87 - 5.11 MIL/uL   Hemoglobin 12.7  12.0 - 15.0 g/dL   HCT 16.1  09.6 - 04.5 %   MCV 85.8  78.0 - 100.0 fL   MCH 29.6  26.0 - 34.0 pg   MCHC 34.5  30.0 - 36.0 g/dL   RDW 40.9 (*) 81.1 - 91.4 %   Platelets 297  150 - 400 K/uL   Neutrophils Relative % 70  43 - 77 %   Neutro Abs 5.5  1.7 - 7.7 K/uL   Lymphocytes Relative 21  12 - 46 %   Lymphs Abs 1.6  0.7 - 4.0 K/uL   Monocytes Relative 7  3 - 12 %   Monocytes Absolute 0.5  0.1 - 1.0 K/uL   Eosinophils Relative 2  0 - 5 %   Eosinophils Absolute 0.2  0.0 - 0.7 K/uL   Basophils Relative 0  0 - 1 %   Basophils Absolute 0.0  0.0 - 0.1 K/uL   Smear Review Criteria for review not met    COMPREHENSIVE METABOLIC PANEL      Result Value Range   Sodium 139  135 - 145 mEq/L   Potassium 4.6  3.5 - 5.3 mEq/L   Chloride 103  96 - 112 mEq/L   CO2 29  19 - 32 mEq/L   Glucose, Bld 90  70 - 99 mg/dL   BUN 22  6 - 23 mg/dL   Creat 7.82  9.56 - 2.13 mg/dL    Total Bilirubin 0.4  0.3 - 1.2 mg/dL   Alkaline Phosphatase 86  39 - 117 U/L   AST 20  0 - 37 U/L   ALT 14  0 - 35 U/L   Total Protein 6.7  6.0 - 8.3 g/dL   Albumin 3.9  3.5 - 5.2 g/dL   Calcium 08.6  8.4 - 57.8 mg/dL  LIPID PANEL      Result Value Range   Cholesterol 163  0 - 200 mg/dL   Triglycerides 77  <469 mg/dL   HDL 68  >62 mg/dL   Total CHOL/HDL Ratio 2.4     VLDL 15  0 - 40 mg/dL   LDL Cholesterol 80  0 - 99 mg/dL    1. Pain in joint, ankle and foot, left     No orders of the defined types were placed in this encounter.     Review of Systems  Musculoskeletal:       Left lateral foot pain.    Triage Vitals: BP 146/82  Pulse 55  Temp(Src) 97.4 F (36.3 C) (Oral)  Resp 18  Wt 109 lb (49.442 kg)  SpO2 100%    Objective:   Physical Exam  Nursing note and vitals reviewed. Constitutional: She is oriented to person, place, and time. She appears well-developed and well-nourished. No distress.  HENT:  Head: Normocephalic and atraumatic.  Neck: Neck supple. No tracheal deviation present.  Cardiovascular: Normal rate.   Pulmonary/Chest: Effort normal. No respiratory distress.  Musculoskeletal: Normal range of motion. She exhibits tenderness.  Ankle is normral swelling at base of 5th metatarsal. There is swelling/tenderness over the left foot medially. There is 1x1 cystic area in mid arch of the foot.   Neurological: She is alert and oriented  to person, place, and time.  Skin: Skin is warm and dry.  Psychiatric: She has a normal mood and affect. Her behavior is normal.   [facility] reading (PRIMARY) by  Dr. Cleta Alberts there is a spur at the base of the fifth metatarsal x-rays are otherwise unremarkable         Assessment & Plan:    I advised her to take ibuprofen 2 twice a day. She is to use a area of mole skin over the cystic area in the arch of the foot. If she continues to have symptoms she will let me know and I will make an orthopedic referral.

## 2013-08-22 ENCOUNTER — Other Ambulatory Visit: Payer: Self-pay

## 2013-08-22 MED ORDER — CLONIDINE HCL 0.1 MG PO TABS: 0.1000 mg | ORAL_TABLET | Freq: Every day | ORAL | Status: DC

## 2013-09-13 ENCOUNTER — Telehealth: Payer: Self-pay | Admitting: Hematology and Oncology

## 2013-09-13 NOTE — Telephone Encounter (Signed)
S/w pt confirming 12/4 appt @ 8:30am.

## 2013-09-14 ENCOUNTER — Other Ambulatory Visit: Payer: Self-pay | Admitting: Hematology and Oncology

## 2013-09-15 ENCOUNTER — Telehealth: Payer: Self-pay | Admitting: Hematology and Oncology

## 2013-09-15 ENCOUNTER — Encounter: Payer: Self-pay | Admitting: Hematology and Oncology

## 2013-09-15 ENCOUNTER — Encounter (INDEPENDENT_AMBULATORY_CARE_PROVIDER_SITE_OTHER): Payer: Self-pay

## 2013-09-15 ENCOUNTER — Other Ambulatory Visit: Payer: Medicare Other | Admitting: Lab

## 2013-09-15 ENCOUNTER — Ambulatory Visit (HOSPITAL_BASED_OUTPATIENT_CLINIC_OR_DEPARTMENT_OTHER): Payer: Medicare Other | Admitting: Hematology and Oncology

## 2013-09-15 VITALS — BP 161/81 | HR 51 | Temp 97.2°F | Resp 18 | Ht 62.0 in | Wt 111.5 lb

## 2013-09-15 DIAGNOSIS — M810 Age-related osteoporosis without current pathological fracture: Secondary | ICD-10-CM

## 2013-09-15 DIAGNOSIS — Z170 Estrogen receptor positive status [ER+]: Secondary | ICD-10-CM

## 2013-09-15 DIAGNOSIS — IMO0001 Reserved for inherently not codable concepts without codable children: Secondary | ICD-10-CM

## 2013-09-15 DIAGNOSIS — Z8541 Personal history of malignant neoplasm of cervix uteri: Secondary | ICD-10-CM

## 2013-09-15 DIAGNOSIS — I10 Essential (primary) hypertension: Secondary | ICD-10-CM

## 2013-09-15 DIAGNOSIS — N951 Menopausal and female climacteric states: Secondary | ICD-10-CM

## 2013-09-15 DIAGNOSIS — C50912 Malignant neoplasm of unspecified site of left female breast: Secondary | ICD-10-CM

## 2013-09-15 DIAGNOSIS — C50919 Malignant neoplasm of unspecified site of unspecified female breast: Secondary | ICD-10-CM

## 2013-09-15 NOTE — Telephone Encounter (Signed)
appt made per 12/4 POF Made appt for Bone Density w [facility] AVS and CAL printed shh

## 2013-09-15 NOTE — Progress Notes (Signed)
[facility] OFFICE PROGRESS NOTE  Patient Care Team: Collene Gobble, MD as PCP - General (Family Medicine) Peter M [place], MD (Cardiology) Dominica Severin, MD (Orthopedic Surgery) Pricilla Loveless, MD (Dermatology) Artis Delay, MD as Consulting Physician (Hematology and Oncology)  DIAGNOSIS: T1, N0, M0 left breast cancer status post lumpectomy, radiation therapy, and ongoing adjuvant endocrine therapy, complicated by severe osteoporosis  SUMMARY OF ONCOLOGIC HISTORY: This is a pleasant 79 year old lady who was diagnosed with breast cancer in 2010. In October of 2010, screening mammogram pick up abnormalities on the left breast. Biopsy confirmed cancer and she subsequently underwent left breast lumpectomy on 08/27/2009 with negative margins and no sentinel lymph node involvement. The patient received adjuvant radiation therapy and will start on anastrozole in January 2011. Serial bone density scan suggested the patient has osteoporosis and she will start on calcium, vitamin D and Fosamax. She was offered Prolia but did not tolerate that well. The patient also has a history of Barrett's esophagus but according to her, her last EGD a year ago show no evidence of abnormalities.  INTERVAL HISTORY: JAXIE RACANELLI 79 y.o. female returns for further followup. She had intermittent hot flashes but they are not severe. She continues to take calcium, vitamin D and Fosamax. She complains of occasional mood swings but no depression or suicidal ideation. The patient have occasional mild just and arthralgias in the morning but they are not severe. She denies any palpable breast mass, pain, nipple changes or lymph node swelling.  I have reviewed the past medical history, past surgical history, social history and family history with the patient and they are unchanged from previous note.  ALLERGIES:  is allergic to daypro.  MEDICATIONS:  Current Outpatient Prescriptions  Medication Sig  Dispense Refill  . alendronate (FOSAMAX) 70 MG tablet Take 1 tablet (70 mg total) by mouth every 7 (seven) days. Take with a full glass of water on an empty stomach.  12 tablet  3  . anastrozole (ARIMIDEX) 1 MG tablet Take 1 tablet (1 mg total) by mouth daily.  90 tablet  3  . Calcium Carbonate-Vitamin D (CALCIUM + D PO) Take 1 capsule by mouth 3 (three) times daily.       . cloNIDine (CATAPRES) 0.1 MG tablet Take 1 tablet (0.1 mg total) by mouth daily.  90 tablet  0  . latanoprost (XALATAN) 0.005 % ophthalmic solution Place 1 drop into both eyes at bedtime.        Marland Kitchen omeprazole (PRILOSEC) 20 MG capsule Take 20 mg by mouth daily.        . rosuvastatin (CRESTOR) 10 MG tablet Take 1 tablet (10 mg total) by mouth daily.  90 tablet  3   No current facility-administered medications for this visit.    REVIEW OF SYSTEMS:   Constitutional: Denies fevers, chills or abnormal weight loss Eyes: Denies blurriness of vision Ears, nose, mouth, throat, and face: Denies mucositis or sore throat Respiratory: Denies cough, dyspnea or wheezes Cardiovascular: Denies palpitation, chest discomfort or lower extremity swelling Gastrointestinal:  Denies nausea, heartburn or change in bowel habits Skin: Denies abnormal skin rashes Lymphatics: Denies new lymphadenopathy or easy bruising Neurological:Denies numbness, tingling or new weaknesses Behavioral/Psych: Mood is stable, no new changes  All other systems were reviewed with the patient and are negative.  PHYSICAL EXAMINATION: ECOG PERFORMANCE STATUS: 0 - Asymptomatic  Filed Vitals:   09/15/13 0823  BP: 161/81  Pulse: 51  Temp: 97.2 F (36.2 C)  Resp: 18   Filed Weights   09/15/13 0823  Weight: 111 lb 8 oz (50.576 kg)    GENERAL:alert, no distress and comfortable. She looks thin and mildly cachectic SKIN: skin color, texture, turgor are normal, no rashes or significant lesions. She has significant moles throughout but nothing suspicious for skin  cancer EYES: normal, Conjunctiva are pink and non-injected, sclera clear OROPHARYNX:no exudate, no erythema and lips, buccal mucosa, and tongue normal  NECK: supple, thyroid normal size, non-tender, without nodularity LYMPH:  no palpable lymphadenopathy in the cervical, axillary or inguinal LUNGS: clear to auscultation and percussion with normal breathing effort HEART: regular rate & rhythm and no murmurs and no lower extremity edema ABDOMEN:abdomen soft, non-tender and normal bowel sounds Musculoskeletal:no cyanosis of digits and no clubbing  NEURO: alert & oriented x 3 with fluent speech, no focal motor/sensory deficits Bilateral breast examination were performed. Normal breast exam on the right. On the left well-healed lumpectomy scar with no palpable abnormalities. LABORATORY DATA:  I have reviewed the data as listed    Component Value Date/Time   NA 139 05/17/2013 0942   NA 140 03/18/2013 0900   NA 140 07/09/2011   K 4.6 05/17/2013 0942   K 4.4 03/18/2013 0900   CL 103 05/17/2013 0942   CL 105 03/18/2013 0900   CO2 29 05/17/2013 0942   CO2 25 03/18/2013 0900   GLUCOSE 90 05/17/2013 0942   GLUCOSE 95 03/18/2013 0900   BUN 22 05/17/2013 0942   BUN 20.2 03/18/2013 0900   CREATININE 0.95 05/17/2013 0942   CREATININE 1.0 03/18/2013 0900   CREATININE 1.02 05/13/2012 0921   CALCIUM 10.0 05/17/2013 0942   CALCIUM 9.6 03/18/2013 0900   PROT 6.7 05/17/2013 0942   PROT 6.7 03/18/2013 0900   ALBUMIN 3.9 05/17/2013 0942   ALBUMIN 3.3* 03/18/2013 0900   AST 20 05/17/2013 0942   AST 21 03/18/2013 0900   ALT 14 05/17/2013 0942   ALT 12 03/18/2013 0900   ALKPHOS 86 05/17/2013 0942   ALKPHOS 80 03/18/2013 0900   BILITOT 0.4 05/17/2013 0942   BILITOT 0.36 03/18/2013 0900   GFRNONAA 50* 08/22/2009 1430   GFRAA  Value: >60        The eGFR has been calculated using the MDRD equation. This calculation has not been validated in all clinical situations. eGFR's persistently <60 mL/min signify possible Chronic Kidney Disease. 08/22/2009 1430    No  results found for this basename: SPEP, UPEP,  kappa and lambda light chains    Lab Results  Component Value Date   WBC 7.8 05/17/2013   NEUTROABS 5.5 05/17/2013   HGB 12.7 05/17/2013   HCT 36.8 05/17/2013   MCV 85.8 05/17/2013   PLT 297 05/17/2013      Chemistry      Component Value Date/Time   NA 139 05/17/2013 0942   NA 140 03/18/2013 0900   NA 140 07/09/2011   K 4.6 05/17/2013 0942   K 4.4 03/18/2013 0900   CL 103 05/17/2013 0942   CL 105 03/18/2013 0900   CO2 29 05/17/2013 0942   CO2 25 03/18/2013 0900   BUN 22 05/17/2013 0942   BUN 20.2 03/18/2013 0900   CREATININE 0.95 05/17/2013 0942   CREATININE 1.0 03/18/2013 0900   CREATININE 1.02 05/13/2012 0921   GLU 47 07/09/2011      Component Value Date/Time   CALCIUM 10.0 05/17/2013 0942   CALCIUM 9.6 03/18/2013 0900   ALKPHOS 86 05/17/2013 0942   ALKPHOS  80 03/18/2013 0900   AST 20 05/17/2013 0942   AST 21 03/18/2013 0900   ALT 14 05/17/2013 0942   ALT 12 03/18/2013 0900   BILITOT 0.4 05/17/2013 0942   BILITOT 0.36 03/18/2013 0900       RADIOGRAPHIC STUDIES: I have personally reviewed the radiological images as listed and agreed with the findings in the report. Her most recent mammogram from October 2014 was negative for disease recurrence. Her last bone density scan from January of 2013 suggested osteoporosis.   ASSESSMENT & PLAN:  #1 T1, N0, M0 left breast cancer There is no evidence of disease recurrence. The patient had low-grade breast cancer. She had multiple side effects from anastrozole. Most importantly, I am concerned about possible worsening osteoporosis. I will like to see her back in 3 months after she had her next bone density scan checked and if her osteoporosis is worse, we might want to discontinue anastrozole and just follow her clinically. I also discussed with her potential treatment to be switched to tamoxifen but the patient declined for now. #2 osteoporosis The patient is currently on calcium, vitamin D and Fosamax. I will recheck her bone  density with the next visit. #3 history of Barrett's esophagus According to her her last EGD was negative. I would monitor her carefully for new symptoms as Fosamax can exacerbate reflux disease #4 history of cervical cancer The patient has undergone hysterectomy many years ago. She has no evidence of recurrence of disease #5 hot flashes This is not bothering her too much. We will observe for now. #6 myalgias and arthralgias This is due to side effects of anastrozole. I reassured the patient. #7 hypertension Her blood pressure is high but the patient think is just due to white coat element #8 preventive care She had received influenza vaccination recently.   Orders Placed This Encounter  Procedures  . DG Bone Density    Standing Status: Future     Number of Occurrences:      Standing Expiration Date: 11/16/2014    Order Specific Question:  Reason for Exam (SYMPTOM  OR DIAGNOSIS REQUIRED)    Answer:  osteoporosis, breast ca    Order Specific Question:  Preferred imaging location?    Answer:  [facility]   All questions were answered. The patient knows to call the clinic with any problems, questions or concerns. No barriers to learning was detected.    Zorah Backes, MD 09/15/2013 8:57 AM

## 2013-09-20 ENCOUNTER — Encounter: Payer: Self-pay | Admitting: Emergency Medicine

## 2013-09-20 ENCOUNTER — Ambulatory Visit (INDEPENDENT_AMBULATORY_CARE_PROVIDER_SITE_OTHER): Payer: Medicare Other | Admitting: Emergency Medicine

## 2013-09-20 VITALS — BP 141/66 | HR 48 | Temp 97.5°F | Resp 16 | Ht 62.5 in | Wt 110.0 lb

## 2013-09-20 DIAGNOSIS — K224 Dyskinesia of esophagus: Secondary | ICD-10-CM

## 2013-09-20 DIAGNOSIS — I10 Essential (primary) hypertension: Secondary | ICD-10-CM

## 2013-09-20 DIAGNOSIS — Z23 Encounter for immunization: Secondary | ICD-10-CM

## 2013-09-20 DIAGNOSIS — C50919 Malignant neoplasm of unspecified site of unspecified female breast: Secondary | ICD-10-CM

## 2013-09-20 DIAGNOSIS — E785 Hyperlipidemia, unspecified: Secondary | ICD-10-CM

## 2013-09-20 DIAGNOSIS — M810 Age-related osteoporosis without current pathological fracture: Secondary | ICD-10-CM

## 2013-09-20 HISTORY — DX: Dyskinesia of esophagus: K22.4

## 2013-09-20 LAB — CBC WITH DIFFERENTIAL/PLATELET
Basophils Absolute: 0 10*3/uL (ref 0.0–0.1)
Basophils Relative: 0 % (ref 0–1)
Eosinophils Absolute: 0.2 10*3/uL (ref 0.0–0.7)
Eosinophils Relative: 2 % (ref 0–5)
HCT: 36.8 % (ref 36.0–46.0)
Hemoglobin: 12.7 g/dL (ref 12.0–15.0)
Lymphocytes Relative: 16 % (ref 12–46)
Lymphs Abs: 1.4 10*3/uL (ref 0.7–4.0)
MCH: 29.5 pg (ref 26.0–34.0)
MCHC: 34.5 g/dL (ref 30.0–36.0)
MCV: 85.4 fL (ref 78.0–100.0)
Monocytes Absolute: 0.7 10*3/uL (ref 0.1–1.0)
Monocytes Relative: 8 % (ref 3–12)
Neutro Abs: 6.4 10*3/uL (ref 1.7–7.7)
Neutrophils Relative %: 74 % (ref 43–77)
Platelets: 256 10*3/uL (ref 150–400)
RBC: 4.31 MIL/uL (ref 3.87–5.11)
RDW: 15.6 % — ABNORMAL HIGH (ref 11.5–15.5)
WBC: 8.6 10*3/uL (ref 4.0–10.5)

## 2013-09-20 LAB — COMPLETE METABOLIC PANEL WITH GFR
ALT: 13 U/L (ref 0–35)
AST: 20 U/L (ref 0–37)
Albumin: 3.9 g/dL (ref 3.5–5.2)
Alkaline Phosphatase: 76 U/L (ref 39–117)
BUN: 27 mg/dL — ABNORMAL HIGH (ref 6–23)
CO2: 26 mEq/L (ref 19–32)
Calcium: 9.8 mg/dL (ref 8.4–10.5)
Chloride: 102 mEq/L (ref 96–112)
Creat: 1.25 mg/dL — ABNORMAL HIGH (ref 0.50–1.10)
GFR, Est African American: 47 mL/min — ABNORMAL LOW
GFR, Est Non African American: 41 mL/min — ABNORMAL LOW
Glucose, Bld: 83 mg/dL (ref 70–99)
Potassium: 4.7 mEq/L (ref 3.5–5.3)
Sodium: 136 mEq/L (ref 135–145)
Total Bilirubin: 0.4 mg/dL (ref 0.3–1.2)
Total Protein: 6.8 g/dL (ref 6.0–8.3)

## 2013-09-20 LAB — LIPID PANEL
Cholesterol: 156 mg/dL (ref 0–200)
HDL: 67 mg/dL (ref >39)
LDL Cholesterol: 72 mg/dL (ref 0–99)
Total CHOL/HDL Ratio: 2.3 Ratio
Triglycerides: 83 mg/dL (ref <150)
VLDL: 17 mg/dL (ref 0–40)

## 2013-09-20 MED ORDER — ZOSTER VACCINE LIVE 19400 UNT/0.65ML ~~LOC~~ SOLR: 0.6500 mL | Freq: Once | SUBCUTANEOUS | Status: DC

## 2013-09-20 MED ORDER — CLONIDINE HCL 0.1 MG PO TABS: 0.1000 mg | ORAL_TABLET | Freq: Every day | ORAL | Status: DC

## 2013-09-20 NOTE — Progress Notes (Signed)
   Subjective:    Patient ID: [name], female    DOB: 1930/11/10, 79 y.o.   MRN: [id number]  HPI patient in for recheck. She is a history of breast cancer or currently on her Demadex. She saw the oncologist recently and they discussed her coming off of a remote fracture and off of her Fosamax depending on the results of her bone density study. She continues on the clonidine she takes for esophageal spasm. No changes in her other medications    Review of Systems     Objective:   Physical Exam patient looks good she is in no distress. Her neck is supple. Her chest was clear to both auscultation and percussion. Heart regular rate no murmurs abdomen soft nontender extremities without edema.        Assessment & Plan:  The clonidine was refilled. We'll await the results of her bone density study. She was given a prescription for shingles vaccine. She is starting had her flu vaccine

## 2013-09-21 LAB — VITAMIN D 25 HYDROXY (VIT D DEFICIENCY, FRACTURES): Vit D, 25-Hydroxy: 92 ng/mL — ABNORMAL HIGH (ref 30–89)

## 2013-10-08 ENCOUNTER — Ambulatory Visit: Payer: Medicare Other

## 2013-10-08 ENCOUNTER — Ambulatory Visit (INDEPENDENT_AMBULATORY_CARE_PROVIDER_SITE_OTHER): Payer: Medicare Other | Admitting: Emergency Medicine

## 2013-10-08 VITALS — BP 160/62 | HR 55 | Temp 97.5°F | Resp 16 | Ht 62.5 in | Wt 112.0 lb

## 2013-10-08 DIAGNOSIS — M25531 Pain in right wrist: Secondary | ICD-10-CM

## 2013-10-08 DIAGNOSIS — S5290XA Unspecified fracture of unspecified forearm, initial encounter for closed fracture: Secondary | ICD-10-CM

## 2013-10-08 DIAGNOSIS — S5291XA Unspecified fracture of right forearm, initial encounter for closed fracture: Secondary | ICD-10-CM

## 2013-10-08 DIAGNOSIS — M25539 Pain in unspecified wrist: Secondary | ICD-10-CM

## 2013-10-08 NOTE — Progress Notes (Signed)
Subjective:   Patient ID: [name], female    DOB: Aug 27, 1931, 79 y.o.   MRN: [id number]  HPI  This chart was scribed for Collene Gobble, MD, by Ellin Mayhew, ED Scribe. This patient was seen in room 05 and the patient's care was started at 12:05 PM.  HPI Comments: [name] is a 79 y.o. female who presents to the [facility] complaining of constant R hand pain and R wrist pain following a fall that occurred last night at the park while she was walking. She reports she tripped on something and broke her fall with there hands. Patient explains the feeling of pain being worse in the wrist versus the hand with some bruising. She has limited ROM since the fall. She denies any other symptoms currently. Patient has a history of osteoporosis and has had fractures to the humerus, distal radius, and shoulder in the past. She confirms an allergy to Daypro.   She was last seen by Dr. Cleta Alberts on 09/30/2013 for a Zoster vaccination.   Past Medical History  Diagnosis Date  . Autoimmune disease   . Dysphagia   . Hyperlipidemia   . Breast cancer 2010    cT1 N0 M0; ER+/ PR+/ HER2 neg; s/p lumpectomy 07/2010; started adjuvant hormonal therapy 10/2009  . Barrett's esophagus   . Osteopenia   . Pneumonia   . Cervical cancer   . Neuromuscular disorder   . Hypertension   . Glaucoma   . Bone lesion     sacrum  . Osteoporosis   . Osteoporosis 03/18/2012  . Humerus fracture 03/2012    impacted left humueral neck fracture, treated by Dr. Amanda Pea  . Fx distal radius NEC-closed   . Fracture of left shoulder     Past Surgical History  Procedure Laterality Date  . Hand surgery    . Ovarian cyst removal  1978  . Appendectomy    . Breast lumpectomy    . Ortho procedures      multiple  . Abdominal hysterectomy  1972    cancer          1 tube 1 ovary  . Shoulder surgery  1954    left  . Colon polyp removal      Family History  Problem Relation Age of Onset  . Heart disease  Father   . Cancer Sister     lung, kidney  . Cancer Brother     brain  . Cancer Paternal Aunt     breast  . Cancer Brother     liver, lung    History   Social History  . Marital Status: Married    Spouse Name: N/A    Number of Children: N/A  . Years of Education: N/A   Occupational History  . Not on file.   Social History Main Topics  . Smoking status: Former Smoker    Quit date: 12/16/1986  . Smokeless tobacco: Never Used     Comment: FORMER SMOKER 30 YEARS AGO  . Alcohol Use: Yes     Comment: OCCASIONALLY  . Drug Use: Not on file  . Sexual Activity: Not on file   Other Topics Concern  . Not on file   Social History Narrative  . No narrative on file    Allergies  Allergen Reactions  . Daypro [Oxaprozin] Swelling    Face and tongue    Patient Active Problem List   Diagnosis Date Noted  . Esophageal  spasm 09/20/2013  . Trigger finger 07/08/2012  . Osteoporosis 03/18/2012  . Fracture, shoulder 03/06/2012  . Hyperlipidemia   . Breast cancer     Results for orders placed in visit on 09/20/13  CBC WITH DIFFERENTIAL      Result Value Range   WBC 8.6  4.0 - 10.5 K/uL   RBC 4.31  3.87 - 5.11 MIL/uL   Hemoglobin 12.7  12.0 - 15.0 g/dL   HCT 47.8  29.5 - 62.1 %   MCV 85.4  78.0 - 100.0 fL   MCH 29.5  26.0 - 34.0 pg   MCHC 34.5  30.0 - 36.0 g/dL   RDW 30.8 (*) 65.7 - 84.6 %   Platelets 256  150 - 400 K/uL   Neutrophils Relative % 74  43 - 77 %   Neutro Abs 6.4  1.7 - 7.7 K/uL   Lymphocytes Relative 16  12 - 46 %   Lymphs Abs 1.4  0.7 - 4.0 K/uL   Monocytes Relative 8  3 - 12 %   Monocytes Absolute 0.7  0.1 - 1.0 K/uL   Eosinophils Relative 2  0 - 5 %   Eosinophils Absolute 0.2  0.0 - 0.7 K/uL   Basophils Relative 0  0 - 1 %   Basophils Absolute 0.0  0.0 - 0.1 K/uL   Smear Review Criteria for review not met    COMPLETE METABOLIC PANEL WITH GFR      Result Value Range   Sodium 136  135 - 145 mEq/L   Potassium 4.7  3.5 - 5.3 mEq/L   Chloride 102  96  - 112 mEq/L   CO2 26  19 - 32 mEq/L   Glucose, Bld 83  70 - 99 mg/dL   BUN 27 (*) 6 - 23 mg/dL   Creat 9.62 (*) 9.52 - 1.10 mg/dL   Total Bilirubin 0.4  0.3 - 1.2 mg/dL   Alkaline Phosphatase 76  39 - 117 U/L   AST 20  0 - 37 U/L   ALT 13  0 - 35 U/L   Total Protein 6.8  6.0 - 8.3 g/dL   Albumin 3.9  3.5 - 5.2 g/dL   Calcium 9.8  8.4 - 84.1 mg/dL   GFR, Est African American 47 (*)    GFR, Est Non African American 41 (*)   LIPID PANEL      Result Value Range   Cholesterol 156  0 - 200 mg/dL   Triglycerides 83  <324 mg/dL   HDL 67  >40 mg/dL   Total CHOL/HDL Ratio 2.3     VLDL 17  0 - 40 mg/dL   LDL Cholesterol 72  0 - 99 mg/dL  VITAMIN D 25 HYDROXY      Result Value Range   Vit D, 25-Hydroxy 92 (*) 30 - 89 ng/mL    No diagnosis found.  No orders of the defined types were placed in this encounter.    BP 160/62  Pulse 55  Temp(Src) 97.5 F (36.4 C) (Oral)  Resp 16  Ht 5' 2.5" (1.588 m)  Wt 112 lb (50.803 kg)  BMI 20.15 kg/m2  SpO2 100%  Review of Systems  Constitutional: Negative for fever and chills.  Respiratory: Negative for shortness of breath.   Gastrointestinal: Negative for nausea, vomiting and diarrhea.  Musculoskeletal: Positive for joint swelling (R wrist).       R hand pain. R wrist pain.  Skin:  Bruising on R wrist.  Neurological: Negative for dizziness, weakness, light-headedness and headaches.   A complete 10 system review of systems was obtained and all systems are negative except as noted in the HPI and PMH.   Objective:  Physical Exam  CONSTITUTIONAL: Well developed/well nourished HEAD: Normocephalic/atraumatic EYES: EOMI/PERRL ENMT: Mucous membranes moist NECK: supple no meningeal signs SPINE:entire spine nontender CV: S1/S2 noted, no murmurs/rubs/gallops noted LUNGS: Lungs are clear to auscultation bilaterally, no apparent distress ABDOMEN: soft, nontender, no rebound or guarding GU:no cva tenderness MUSC: Limited ROM and  strength in R wrist/hand. Swelling and tenderness noted near the R distal radius and ulna NEURO: Pt is awake/alert, moves all extremitiesx4 EXTREMITIES: pulses normal. SKIN: warm, color normal PSYCH: no abnormalities of mood noted [facility] reading (PRIMARY) by  Dr.Daub there is an impacted fracture of the distal radius   Assessment & Plan:  Patient has an impacted fracture the distal radius. She will be placed in a sugar tong splint. Referral made to Dr. Amanda Pea  and for his evaluation .  I personally performed the services described in this documentation, which was scribed in my presence. The recorded information has been reviewed and is accurate.

## 2013-10-17 ENCOUNTER — Other Ambulatory Visit: Payer: Self-pay | Admitting: *Deleted

## 2013-10-17 DIAGNOSIS — M810 Age-related osteoporosis without current pathological fracture: Secondary | ICD-10-CM

## 2013-10-17 MED ORDER — ALENDRONATE SODIUM 70 MG PO TABS: 70.0000 mg | ORAL_TABLET | ORAL | Status: DC

## 2013-10-25 ENCOUNTER — Ambulatory Visit (INDEPENDENT_AMBULATORY_CARE_PROVIDER_SITE_OTHER): Payer: Medicare Other | Admitting: Emergency Medicine

## 2013-10-25 ENCOUNTER — Encounter: Payer: Self-pay | Admitting: Emergency Medicine

## 2013-10-25 VITALS — BP 148/74 | HR 52 | Temp 97.7°F | Resp 16 | Ht 62.25 in | Wt 107.6 lb

## 2013-10-25 DIAGNOSIS — R110 Nausea: Secondary | ICD-10-CM

## 2013-10-25 DIAGNOSIS — N289 Disorder of kidney and ureter, unspecified: Secondary | ICD-10-CM

## 2013-10-25 DIAGNOSIS — R7989 Other specified abnormal findings of blood chemistry: Secondary | ICD-10-CM

## 2013-10-25 LAB — BASIC METABOLIC PANEL
BUN: 20 mg/dL (ref 6–23)
CO2: 26 mEq/L (ref 19–32)
Calcium: 9.9 mg/dL (ref 8.4–10.5)
Chloride: 99 mEq/L (ref 96–112)
Creat: 1 mg/dL (ref 0.50–1.10)
Glucose, Bld: 80 mg/dL (ref 70–99)
Potassium: 4.7 mEq/L (ref 3.5–5.3)
Sodium: 136 mEq/L (ref 135–145)

## 2013-10-25 NOTE — Progress Notes (Signed)
   Subjective:    Patient ID: [name], female    DOB: [birth year], 80 y.o.   MRN: [id number]  HPI patient here for followup of decreased renal function found on her last set of lab overall she is doing well. She recently fractured her right wrist . She currently is in a long arm cast. She continues to see her oncologist for her breast cancer and she is 4 months into her chemotherapy. She is due for a bone density study in the near future. She does feel somewhat nauseated today.    Review of Systems     Objective:   Physical Exam patient is alert and cooperative her chest is clear heart regular rate no murmurs abdomen soft nontender        Assessment & Plan:  Renal panel done today we'll call with results no changes in medication

## 2013-11-11 ENCOUNTER — Ambulatory Visit
Admission: RE | Admit: 2013-11-11 | Discharge: 2013-11-11 | Disposition: A | Discharge status: Home / Self Care | Payer: Self-pay | Source: Ambulatory Visit | Attending: Hematology and Oncology | Admitting: Hematology and Oncology

## 2013-11-11 DIAGNOSIS — M810 Age-related osteoporosis without current pathological fracture: Secondary | ICD-10-CM

## 2013-11-11 DIAGNOSIS — C50912 Malignant neoplasm of unspecified site of left female breast: Secondary | ICD-10-CM

## 2013-11-23 ENCOUNTER — Ambulatory Visit (HOSPITAL_BASED_OUTPATIENT_CLINIC_OR_DEPARTMENT_OTHER): Payer: Medicare Other | Admitting: Hematology and Oncology

## 2013-11-23 ENCOUNTER — Encounter: Payer: Self-pay | Admitting: Hematology and Oncology

## 2013-11-23 VITALS — BP 146/61 | HR 56 | Temp 97.4°F | Resp 18 | Ht 62.25 in | Wt 110.7 lb

## 2013-11-23 DIAGNOSIS — Z8541 Personal history of malignant neoplasm of cervix uteri: Secondary | ICD-10-CM

## 2013-11-23 DIAGNOSIS — C50919 Malignant neoplasm of unspecified site of unspecified female breast: Secondary | ICD-10-CM

## 2013-11-23 DIAGNOSIS — M810 Age-related osteoporosis without current pathological fracture: Secondary | ICD-10-CM

## 2013-11-23 NOTE — Progress Notes (Signed)
[place] OFFICE PROGRESS NOTE  Patient Care Team: Darlyne Russian, MD as PCP - General (Family Medicine) Peter M Martinique, MD (Cardiology) Roseanne Kaufman, MD (Orthopedic Surgery) Keitha Butte, MD (Dermatology) Heath Lark, MD as Consulting Physician (Hematology and Oncology)  DIAGNOSIS: T1, N0, M0 left breast cancer status post lumpectomy, radiation therapy, and ongoing adjuvant endocrine therapy, complicated by severe osteoporosis  SUMMARY OF ONCOLOGIC HISTORY: In October of 2010, screening mammogram pick up abnormalities on the left breast. Biopsy confirmed cancer and she subsequently underwent left breast lumpectomy on 08/27/2009 with negative margins and no sentinel lymph node involvement. The patient received adjuvant radiation therapy and will start on anastrozole in January 2011. Serial bone density scan suggested the patient has osteoporosis and she will start on calcium, vitamin D and Fosamax. She was offered Prolia but did not tolerate that well. The patient also has a history of Barrett's esophagus but according to her, her last EGD a year ago show no evidence of abnormalities. On 11/23/2013, the patient has made informed consent to stop anastrozole due to severe osteoporosis  INTERVAL HISTORY: [name] 80 y.o. female returns for further followup. She recently fell and broke her arm. She has a cast on She has very mild intermittent hot flashes. Denies any depression or suicidal ideation. Has very mild myalgias and arthralgias She denies any recent abnormal breast examination, palpable mass, abnormal breast appearance or nipple changes   I have reviewed the past medical history, past surgical history, social history and family history with the patient and they are unchanged from previous note.  ALLERGIES:  is allergic to daypro.  MEDICATIONS:  Current Outpatient Prescriptions  Medication Sig Dispense Refill  . alendronate (FOSAMAX) 70 MG tablet  Take 1 tablet (70 mg total) by mouth every 7 (seven) days. Take with a full glass of water on an empty stomach.  12 tablet  0  . anastrozole (ARIMIDEX) 1 MG tablet Take 1 tablet (1 mg total) by mouth daily.  90 tablet  3  . Calcium Carbonate-Vitamin D (CALCIUM + D PO) Take 1 capsule by mouth 3 (three) times daily.       . cloNIDine (CATAPRES) 0.1 MG tablet Take 1 tablet (0.1 mg total) by mouth daily.  90 tablet  3  . latanoprost (XALATAN) 0.005 % ophthalmic solution Place 1 drop into both eyes at bedtime.        Marland Kitchen omeprazole (PRILOSEC) 20 MG capsule Take 20 mg by mouth daily.        . rosuvastatin (CRESTOR) 10 MG tablet Take 1 tablet (10 mg total) by mouth daily.  90 tablet  3   No current facility-administered medications for this visit.    REVIEW OF SYSTEMS:   Constitutional: Denies fevers, chills or abnormal weight loss Eyes: Denies blurriness of vision Ears, nose, mouth, throat, and face: Denies mucositis or sore throat Respiratory: Denies cough, dyspnea or wheezes Cardiovascular: Denies palpitation, chest discomfort or lower extremity swelling Gastrointestinal:  Denies nausea, heartburn or change in bowel habits Skin: Denies abnormal skin rashes Lymphatics: Denies new lymphadenopathy or easy bruising Neurological:Denies numbness, tingling or new weaknesses Behavioral/Psych: Mood is stable, no new changes  All other systems were reviewed with the patient and are negative.  PHYSICAL EXAMINATION: ECOG PERFORMANCE STATUS: 1 - Symptomatic but completely ambulatory  Filed Vitals:   11/23/13 0942  BP: 146/61  Pulse: 56  Temp: 97.4 F (36.3 C)  Resp: 18   Filed Weights   11/23/13  4193  Weight: 110 lb 11.2 oz (50.213 kg)    GENERAL:alert, no distress and comfortable SKIN: skin color, texture, turgor are normal, no rashes or significant lesions Musculoskeletal:no cyanosis of digits and no clubbing  NEURO: alert & oriented x 3 with fluent speech, no focal motor/sensory  deficits  LABORATORY DATA:  I have reviewed the data as listed    Component Value Date/Time   NA 136 10/25/2013 0819   NA 140 03/18/2013 0900   NA 140 07/09/2011   K 4.7 10/25/2013 0819   K 4.4 03/18/2013 0900   CL 99 10/25/2013 0819   CL 105 03/18/2013 0900   CO2 26 10/25/2013 0819   CO2 25 03/18/2013 0900   GLUCOSE 80 10/25/2013 0819   GLUCOSE 95 03/18/2013 0900   BUN 20 10/25/2013 0819   BUN 20.2 03/18/2013 0900   CREATININE 1.00 10/25/2013 0819   CREATININE 1.0 03/18/2013 0900   CREATININE 1.02 05/13/2012 0921   CALCIUM 9.9 10/25/2013 0819   CALCIUM 9.6 03/18/2013 0900   PROT 6.8 09/20/2013 0858   PROT 6.7 03/18/2013 0900   ALBUMIN 3.9 09/20/2013 0858   ALBUMIN 3.3* 03/18/2013 0900   AST 20 09/20/2013 0858   AST 21 03/18/2013 0900   ALT 13 09/20/2013 0858   ALT 12 03/18/2013 0900   ALKPHOS 76 09/20/2013 0858   ALKPHOS 80 03/18/2013 0900   BILITOT 0.4 09/20/2013 0858   BILITOT 0.36 03/18/2013 0900   GFRNONAA 50* 08/22/2009 1430   GFRAA  Value: >60        The eGFR has been calculated using the MDRD equation. This calculation has not been validated in all clinical situations. eGFR's persistently <60 mL/min signify possible Chronic Kidney Disease. 08/22/2009 1430    No results found for this basename: SPEP,  UPEP,   kappa and lambda light chains    Lab Results  Component Value Date   WBC 8.6 09/20/2013   NEUTROABS 6.4 09/20/2013   HGB 12.7 09/20/2013   HCT 36.8 09/20/2013   MCV 85.4 09/20/2013   PLT 256 09/20/2013      Chemistry      Component Value Date/Time   NA 136 10/25/2013 0819   NA 140 03/18/2013 0900   NA 140 07/09/2011   K 4.7 10/25/2013 0819   K 4.4 03/18/2013 0900   CL 99 10/25/2013 0819   CL 105 03/18/2013 0900   CO2 26 10/25/2013 0819   CO2 25 03/18/2013 0900   BUN 20 10/25/2013 0819   BUN 20.2 03/18/2013 0900   CREATININE 1.00 10/25/2013 0819   CREATININE 1.0 03/18/2013 0900   CREATININE 1.02 05/13/2012 0921   GLU 47 07/09/2011      Component Value Date/Time   CALCIUM 9.9 10/25/2013 0819   CALCIUM 9.6  03/18/2013 0900   ALKPHOS 76 09/20/2013 0858   ALKPHOS 80 03/18/2013 0900   AST 20 09/20/2013 0858   AST 21 03/18/2013 0900   ALT 13 09/20/2013 0858   ALT 12 03/18/2013 0900   BILITOT 0.4 09/20/2013 0858   BILITOT 0.36 03/18/2013 0900       RADIOGRAPHIC STUDIES: I reviewed the most recent bone density scan which show worsening osteoporosis I have personally reviewed the radiological images as listed and agreed with the findings in the report.  ASSESSMENT & PLAN:  #1 T1, N0, M0 breast cancer She has completed 4 years of anastrozole. She has low-grade breast cancer. At this point in time, the risk outweighed the benefit. I discussed with the patient  the pros and cons of discontinuation of endocrine therapy and she agreed to stop anastrozole. I will see her on a yearly basis with history, physical examination, and yearly mammogram #2 osteoporosis with recent bone fracture She will continue calcium, vitamin D and Fosamax. We will recheck bone density in 2 years As mentioned above, we would discontinue anastrozole #3 history of Barrett's esophagus According to the patient her last EGD was negative #4 history of cervical cancer She had hysterectomy many years ago with no evidence of recurrence of disease #5 history of hot flashes, myalgias and arthralgias Correlate these symptoms will regress after discontinuation of anastrozole All questions were answered. The patient knows to call the clinic with any problems, questions or concerns. No barriers to learning was detected. I spent 25 minutes counseling the patient face to face. The total time spent in the appointment was 40 minutes and more than 50% was on counseling and review of test results     [facility], [place], MD 11/23/2013 2:28 PM

## 2013-11-24 ENCOUNTER — Telehealth: Payer: Self-pay | Admitting: *Deleted

## 2013-11-24 NOTE — Telephone Encounter (Signed)
sw pt gv appt for 08/14/14 @ 9am. Pt is aware...td

## 2013-12-15 ENCOUNTER — Other Ambulatory Visit: Payer: Self-pay

## 2013-12-26 ENCOUNTER — Ambulatory Visit (INDEPENDENT_AMBULATORY_CARE_PROVIDER_SITE_OTHER): Payer: Medicare Other | Admitting: General Surgery

## 2013-12-26 ENCOUNTER — Encounter (INDEPENDENT_AMBULATORY_CARE_PROVIDER_SITE_OTHER): Payer: Self-pay | Admitting: General Surgery

## 2013-12-26 VITALS — BP 132/80 | HR 74 | Temp 97.5°F | Resp 14 | Ht 63.0 in | Wt 109.0 lb

## 2013-12-26 DIAGNOSIS — C50919 Malignant neoplasm of unspecified site of unspecified female breast: Secondary | ICD-10-CM

## 2013-12-26 NOTE — Progress Notes (Signed)
Subjective:     Patient ID: [name], female   DOB: 02-Jul-1931, 80 y.o.   MRN: [id number]  HPI The patient is an 80 year old white female who is 4 years status post left breast lumpectomy and negative sentinel node biopsy for a T1 B. N0 left breast cancer. Since her last visit she has fallen a couple times. She has broken her left shoulder and her right wrist. These injuries are now healing but she has been taken off the anastrozole because of her bone density. She is also scheduled to have a skin cancer removed from her right hand in the near future. Her last mammogram was in October that showed no evidence of malignancy  Review of Systems  Constitutional: Negative.   HENT: Negative.   Eyes: Negative.   Respiratory: Negative.   Cardiovascular: Negative.   Gastrointestinal: Negative.   Endocrine: Negative.   Genitourinary: Negative.   Musculoskeletal: Negative.   Skin: Negative.   Allergic/Immunologic: Negative.   Neurological: Negative.   Hematological: Negative.   Psychiatric/Behavioral: Negative.        Objective:   Physical Exam  Constitutional: She is oriented to person, place, and time. She appears well-developed and well-nourished.  HENT:  Head: Normocephalic and atraumatic.  Eyes: Conjunctivae and EOM are normal. Pupils are equal, round, and reactive to light.  Neck: Normal range of motion. Neck supple.  Cardiovascular: Normal rate, regular rhythm and normal heart sounds.   Pulmonary/Chest: Effort normal and breath sounds normal.  There is no palpable mass in either breast. There is no palpable axillary, supraclavicular, or cervical lymphadenopathy. She does have some tenderness on the 6:00 position of the left breast that is most likely secondary to radiation  Abdominal: Soft. Bowel sounds are normal.  Musculoskeletal: Normal range of motion.  Lymphadenopathy:    She has no cervical adenopathy.  Neurological: She is alert and oriented to person, place, and time.   Skin: Skin is warm and dry.  Psychiatric: She has a normal mood and affect. Her behavior is normal.       Assessment:     The patient is 4 years status post left breast lumpectomy for breast cancer     Plan:     At this point she will continue to do regular self exams. She will no longer take anastrozole. We will plan to see her back in another year.

## 2013-12-26 NOTE — Patient Instructions (Signed)
Continue regular self exams  

## 2014-01-02 ENCOUNTER — Other Ambulatory Visit: Payer: Self-pay | Admitting: Dermatology

## 2014-01-10 ENCOUNTER — Encounter: Payer: Self-pay | Admitting: Emergency Medicine

## 2014-01-10 ENCOUNTER — Ambulatory Visit (INDEPENDENT_AMBULATORY_CARE_PROVIDER_SITE_OTHER): Payer: Medicare Other | Admitting: Emergency Medicine

## 2014-01-10 VITALS — BP 143/72 | HR 65 | Temp 97.8°F | Resp 16 | Ht 62.25 in | Wt 108.2 lb

## 2014-01-10 DIAGNOSIS — Z8659 Personal history of other mental and behavioral disorders: Secondary | ICD-10-CM

## 2014-01-10 DIAGNOSIS — M810 Age-related osteoporosis without current pathological fracture: Secondary | ICD-10-CM

## 2014-01-10 DIAGNOSIS — I499 Cardiac arrhythmia, unspecified: Secondary | ICD-10-CM

## 2014-01-10 DIAGNOSIS — Z8669 Personal history of other diseases of the nervous system and sense organs: Secondary | ICD-10-CM

## 2014-01-10 DIAGNOSIS — E785 Hyperlipidemia, unspecified: Secondary | ICD-10-CM

## 2014-01-10 LAB — LIPID PANEL
Cholesterol: 173 mg/dL (ref 0–200)
HDL: 78 mg/dL (ref >39)
LDL Cholesterol: 82 mg/dL (ref 0–99)
Total CHOL/HDL Ratio: 2.2 Ratio
Triglycerides: 64 mg/dL (ref <150)
VLDL: 13 mg/dL (ref 0–40)

## 2014-01-10 LAB — VITAMIN D 25 HYDROXY (VIT D DEFICIENCY, FRACTURES): Vit D, 25-Hydroxy: 86 ng/mL (ref 30–89)

## 2014-01-10 NOTE — Progress Notes (Signed)
  This chart was scribed for Lori Russian, MD by Eston Mould, ED Scribe. This patient was seen in room Room/bed 21 and the patient's care was started at 9:37 AM. Subjective:    Patient ID: [name], female    DOB: 11/27/1930, 80 y.o.   MRN: [id number] Chief Complaint  Patient presents with  . 3 month check up   HPI ANALICIA SKIBINSKI is a 80 y.o. female who presents to the [facility] for 3 month F/U apt. Pt states she has been feeling well lately. Pt states she had hand surgery by Dr.Goodrich due to having R hand cancer. Pt states she still has stiches from surgery. Pt states she had squamous cell cancer. She generally F/U once a year.   Pt states she has been seen by Dr. Clearance Coots in February 2015 and was taken off her Chemo drug and other drug due to worsening results of bone density screening. She states Dr. Clearance Coots will F/U once a year with pt, October-November. Pt states she has been exercising and reports feeling well!  Pt states she is taking 1/2 at night and 1/2 during the day of Clonidine .5 mg for esophageal tics. She states she is taking Crestor and Catapres and denies having trouble with medication. She denies having trouble with her esophagus, weakness, dizziness, and LOC episodes  Review of Systems  Constitutional: Negative for activity change and appetite change.  HENT: Negative for trouble swallowing.   Cardiovascular: Negative for chest pain and palpitations.  Neurological: Negative for dizziness, syncope and weakness.  All other systems reviewed and are negative.   Objective:   Physical Exam CONSTITUTIONAL: Well developed/well nourished HEAD: Normocephalic/atraumatic EYES: EOMI/PERRL ENMT: Mucous membranes moist NECK: supple no meningeal signs SPINE:entire spine nontender CV: Multiple premature beats. LUNGS: Lungs are clear to auscultation bilaterally, no apparent distress ABDOMEN: soft, nontender, no rebound or guarding GU:no cva tenderness NEURO: Pt is  awake/alert, moves all extremitiesx4 EXTREMITIES: pulses normal, full ROM SKIN: warm, color normal PSYCH: no abnormalities of mood noted  EKG: sinus bradycardia, PAC's noted.   Triage Vitals:BP 143/72  Pulse 65  Temp(Src) 97.8 F (36.6 C) (Oral)  Resp 16  Ht 5' 2.25" (1.581 m)  Wt 108 lb 3.2 oz (49.079 kg)  BMI 19.64 kg/m2  SpO2 100% Assessment & Plan:   Patient is doing well. She does have sinus bradycardia with PACs. I suspect this is related to her clonidine even though she is on a low dose. She will decrease her clonidine 0.1 mg to a half tablet a day for one week and then stop. She will let me know if she resumes having esophageal spasms.  I personally performed the services described in this documentation, which was scribed in my presence. The recorded information has been reviewed and is accurate.

## 2014-05-25 ENCOUNTER — Ambulatory Visit: Payer: Medicare Other | Admitting: Emergency Medicine

## 2014-05-30 ENCOUNTER — Encounter: Payer: Self-pay | Admitting: Emergency Medicine

## 2014-05-30 ENCOUNTER — Ambulatory Visit (INDEPENDENT_AMBULATORY_CARE_PROVIDER_SITE_OTHER): Payer: Medicare Other | Admitting: Emergency Medicine

## 2014-05-30 VITALS — BP 120/80 | HR 66 | Temp 98.4°F | Resp 16 | Ht 61.75 in | Wt 104.0 lb

## 2014-05-30 DIAGNOSIS — E785 Hyperlipidemia, unspecified: Secondary | ICD-10-CM

## 2014-05-30 DIAGNOSIS — Z23 Encounter for immunization: Secondary | ICD-10-CM

## 2014-05-30 MED ORDER — ROSUVASTATIN CALCIUM 10 MG PO TABS: 10.0000 mg | ORAL_TABLET | Freq: Every day | ORAL | Status: DC

## 2014-05-30 NOTE — Progress Notes (Addendum)
Subjective:  This chart was scribed for Lori Queen, MD by Donato Schultz, Medical Scribe. This patient was seen in Room 21 and the patient's care was started at 3:35 PM.   Patient ID: [name], female    DOB: 15-Mar-1931, 80 y.o.   MRN: [id number]  HPI HPI Comments: SHEARON Mccoy is a 80 y.o. female who presents to the [facility] for a follow-up exam.  She has been feeling good emotionally and physically.  She has not fractured any bones but is still experiencing some pain in her right arm but was told that it should resolve within a year.  Her GI issues have resolved since her last visit.  She will have another bone density scan in 2 years.  She never took Fosamax due to there GI issues.  She has not received the Prevnar and wants to receive the flu shot in the Fall.  She uses her eye drops and takes Crestor and calcium pills daily.  She is walking regularly.  She lives with her oldest daughter.     Past Medical History  Diagnosis Date  . Autoimmune disease   . Dysphagia   . Hyperlipidemia   . Breast cancer 2010    cT1 N0 M0; ER+/ PR+/ HER2 neg; s/p lumpectomy 07/2010; started adjuvant hormonal therapy 10/2009  . Barrett's esophagus   . Osteopenia   . Pneumonia   . Cervical cancer   . Neuromuscular disorder   . Hypertension   . Glaucoma   . Bone lesion     sacrum  . Osteoporosis   . Osteoporosis 03/18/2012  . Humerus fracture 03/2012    impacted left humueral neck fracture, treated by Dr. Amedeo Plenty  . Fx distal radius NEC-closed   . Fracture of left shoulder    Past Surgical History  Procedure Laterality Date  . Hand surgery    . Ovarian cyst removal  1978  . Appendectomy    . Breast lumpectomy    . Ortho procedures      multiple  . Abdominal hysterectomy  1972    cancer          1 tube 1 ovary  . Shoulder surgery  1954    left  . Colon polyp removal     Family History  Problem Relation Age of Onset  . Heart disease Father   . Cancer Sister    lung, kidney  . Cancer Brother     brain  . Cancer Paternal Aunt     breast  . Cancer Brother     liver, lung   History   Social History  . Marital Status: Married    Spouse Name: N/A    Number of Children: N/A  . Years of Education: N/A   Occupational History  . Not on file.   Social History Main Topics  . Smoking status: Former Smoker    Quit date: 12/16/1986  . Smokeless tobacco: Never Used     Comment: FORMER SMOKER 30 YEARS AGO  . Alcohol Use: Yes     Comment: OCCASIONALLY  . Drug Use: Not on file  . Sexual Activity: Not on file   Other Topics Concern  . Not on file   Social History Narrative  . No narrative on file   Allergies  Allergen Reactions  . Daypro [Oxaprozin] Swelling    Face and tongue    Review of Systems  Musculoskeletal: Positive for arthralgias.     Objective:  Physical Exam  Nursing note and vitals reviewed. Constitutional: She is oriented to person, place, and time. She appears well-developed and well-nourished.  HENT:  Head: Normocephalic and atraumatic.  Eyes: EOM are normal.  Neck: Normal range of motion. Neck supple. No thyromegaly present.  Cardiovascular: Normal rate, regular rhythm and normal heart sounds.  Exam reveals no gallop and no friction rub.   No murmur heard. Pulmonary/Chest: Effort normal and breath sounds normal. No respiratory distress. She has no wheezes. She has no rales.  Abdominal: Soft. Bowel sounds are normal. There is no tenderness.  Musculoskeletal: Normal range of motion.  Lymphadenopathy:    She has no cervical adenopathy.  Neurological: She is alert and oriented to person, place, and time.  Skin: Skin is warm and dry.  3x28mm crusted red area on the medial portion of the right lower leg.  Psychiatric: She has a normal mood and affect. Her behavior is normal.     BP 120/80  Pulse 66  Temp(Src) 98.4 F (36.9 C) (Oral)  Resp 16  Ht 5' 1.75" (1.568 m)  Wt 104 lb (47.174 kg)  BMI 19.19 kg/m2   SpO2 99% Assessment & Plan:  Patient is doing very well. Her Crestor was refilled. We'll recheck 3 months check blood work at that time. She was given Prevnar vaccination. She will get her flu shot in the fall she will see me in 3 months we will do fasting blood work at that time  I personally performed the services described in this documentation, which was scribed in my presence. The recorded information has been reviewed and is accurate.

## 2014-06-07 ENCOUNTER — Other Ambulatory Visit: Payer: Self-pay

## 2014-07-03 ENCOUNTER — Other Ambulatory Visit: Payer: Self-pay | Admitting: Emergency Medicine

## 2014-07-03 ENCOUNTER — Other Ambulatory Visit: Payer: Self-pay

## 2014-07-03 DIAGNOSIS — Z853 Personal history of malignant neoplasm of breast: Secondary | ICD-10-CM

## 2014-07-03 DIAGNOSIS — Z9889 Other specified postprocedural states: Secondary | ICD-10-CM

## 2014-07-11 ENCOUNTER — Ambulatory Visit: Payer: Medicare Other | Admitting: Emergency Medicine

## 2014-07-13 ENCOUNTER — Ambulatory Visit (INDEPENDENT_AMBULATORY_CARE_PROVIDER_SITE_OTHER): Payer: Medicare Other | Admitting: Radiology

## 2014-07-13 DIAGNOSIS — Z23 Encounter for immunization: Secondary | ICD-10-CM

## 2014-08-07 ENCOUNTER — Ambulatory Visit
Admission: RE | Admit: 2014-08-07 | Discharge: 2014-08-07 | Disposition: A | Discharge status: Home / Self Care | Payer: Medicare Other | Source: Ambulatory Visit | Attending: Emergency Medicine | Admitting: Emergency Medicine

## 2014-08-07 DIAGNOSIS — Z853 Personal history of malignant neoplasm of breast: Secondary | ICD-10-CM

## 2014-08-07 DIAGNOSIS — Z9889 Other specified postprocedural states: Secondary | ICD-10-CM

## 2014-08-11 ENCOUNTER — Emergency Department (HOSPITAL_BASED_OUTPATIENT_CLINIC_OR_DEPARTMENT_OTHER)
Admission: EM | Admit: 2014-08-11 | Discharge: 2014-08-11 | Disposition: A | Discharge status: Home / Self Care | Payer: Medicare Other | Attending: Emergency Medicine | Admitting: Emergency Medicine

## 2014-08-11 ENCOUNTER — Emergency Department (HOSPITAL_BASED_OUTPATIENT_CLINIC_OR_DEPARTMENT_OTHER): Payer: Medicare Other

## 2014-08-11 ENCOUNTER — Encounter (HOSPITAL_BASED_OUTPATIENT_CLINIC_OR_DEPARTMENT_OTHER): Payer: Self-pay | Admitting: Emergency Medicine

## 2014-08-11 DIAGNOSIS — E785 Hyperlipidemia, unspecified: Secondary | ICD-10-CM | POA: Insufficient documentation

## 2014-08-11 DIAGNOSIS — Z853 Personal history of malignant neoplasm of breast: Secondary | ICD-10-CM | POA: Insufficient documentation

## 2014-08-11 DIAGNOSIS — Z79899 Other long term (current) drug therapy: Secondary | ICD-10-CM | POA: Insufficient documentation

## 2014-08-11 DIAGNOSIS — M62838 Other muscle spasm: Secondary | ICD-10-CM | POA: Diagnosis not present

## 2014-08-11 DIAGNOSIS — Z8669 Personal history of other diseases of the nervous system and sense organs: Secondary | ICD-10-CM | POA: Insufficient documentation

## 2014-08-11 DIAGNOSIS — Z8781 Personal history of (healed) traumatic fracture: Secondary | ICD-10-CM | POA: Insufficient documentation

## 2014-08-11 DIAGNOSIS — M810 Age-related osteoporosis without current pathological fracture: Secondary | ICD-10-CM | POA: Diagnosis not present

## 2014-08-11 DIAGNOSIS — M79659 Pain in unspecified thigh: Secondary | ICD-10-CM

## 2014-08-11 DIAGNOSIS — M549 Dorsalgia, unspecified: Secondary | ICD-10-CM | POA: Diagnosis present

## 2014-08-11 DIAGNOSIS — Z87891 Personal history of nicotine dependence: Secondary | ICD-10-CM | POA: Insufficient documentation

## 2014-08-11 DIAGNOSIS — Z8541 Personal history of malignant neoplasm of cervix uteri: Secondary | ICD-10-CM | POA: Insufficient documentation

## 2014-08-11 DIAGNOSIS — M79652 Pain in left thigh: Secondary | ICD-10-CM | POA: Insufficient documentation

## 2014-08-11 DIAGNOSIS — H409 Unspecified glaucoma: Secondary | ICD-10-CM | POA: Insufficient documentation

## 2014-08-11 DIAGNOSIS — I10 Essential (primary) hypertension: Secondary | ICD-10-CM | POA: Diagnosis not present

## 2014-08-11 DIAGNOSIS — Z8701 Personal history of pneumonia (recurrent): Secondary | ICD-10-CM | POA: Diagnosis not present

## 2014-08-11 NOTE — ED Notes (Signed)
Pt reports left back pain that began this morning while trying to get out of bed.

## 2014-08-11 NOTE — ED Provider Notes (Signed)
CSN: [id number]     Arrival date & time 08/11/14  1240 History   First MD Initiated Contact with Patient 08/11/14 1307     Chief Complaint  Patient presents with  . Back Pain      Patient is a 80 y.o. female presenting with back pain. The history is provided by the patient.  Back Pain Location:  Gluteal region Quality:  Aching Radiates to:  Does not radiate Pain severity:  Moderate Onset quality:  Sudden Duration:  5 days Timing:  Constant Progression:  Worsening Chronicity:  New Relieved by:  Bed rest Worsened by:  Movement Associated symptoms: no bladder incontinence, no bowel incontinence, no chest pain, no fever and no weakness   patient reports while moving around in bed today she had onset of pain/spasms in left posterior thigh/buttock No trauma/fall No cp/sob No leg weakness No urinary/fecal incontinence  Pt is able to ambulate   Past Medical History  Diagnosis Date  . Autoimmune disease   . Dysphagia   . Hyperlipidemia   . Breast cancer 2010    cT1 N0 M0; ER+/ PR+/ HER2 neg; s/p lumpectomy 07/2010; started adjuvant hormonal therapy 10/2009  . Barrett's esophagus   . Osteopenia   . Pneumonia   . Cervical cancer   . Neuromuscular disorder   . Hypertension   . Glaucoma   . Bone lesion     sacrum  . Osteoporosis   . Osteoporosis 03/18/2012  . Humerus fracture 03/2012    impacted left humueral neck fracture, treated by Dr. Amedeo Plenty  . Fx distal radius NEC-closed   . Fracture of left shoulder    Past Surgical History  Procedure Laterality Date  . Hand surgery    . Ovarian cyst removal  1978  . Appendectomy    . Breast lumpectomy    . Ortho procedures      multiple  . Abdominal hysterectomy  1972    cancer          1 tube 1 ovary  . Shoulder surgery  1954    left  . Colon polyp removal     Family History  Problem Relation Age of Onset  . Heart disease Father   . Cancer Sister     lung, kidney  . Cancer Brother     brain  . Cancer Paternal Aunt      breast  . Cancer Brother     liver, lung   History  Substance Use Topics  . Smoking status: Former Smoker    Quit date: 12/16/1986  . Smokeless tobacco: Never Used     Comment: FORMER SMOKER 30 YEARS AGO  . Alcohol Use: Yes     Comment: OCCASIONALLY   OB History   Grav Para Term Preterm Abortions TAB SAB Ect Mult Living                 Review of Systems  Constitutional: Negative for fever.  Respiratory: Negative for shortness of breath.   Cardiovascular: Negative for chest pain.  Gastrointestinal: Negative for bowel incontinence.  Genitourinary: Negative for bladder incontinence.  Musculoskeletal: Positive for back pain.  Neurological: Negative for weakness.  All other systems reviewed and are negative.     Allergies  Daypro  Home Medications   Prior to Admission medications   Medication Sig Start Date End Date Taking? Authorizing Provider  anastrozole (ARIMIDEX) 1 MG tablet Take 1 tablet (1 mg total) by mouth daily. 04/19/13   Nobie Putnam, MD  Calcium Carbonate-Vitamin D (CALCIUM + D PO) Take 1 capsule by mouth 3 (three) times daily.     Historical Provider, MD  cloNIDine (CATAPRES) 0.1 MG tablet Take 1 tablet (0.1 mg total) by mouth daily. 09/20/13   Darlyne Russian, MD  latanoprost (XALATAN) 0.005 % ophthalmic solution Place 1 drop into both eyes at bedtime.      Historical Provider, MD  omeprazole (PRILOSEC) 20 MG capsule Take 20 mg by mouth daily.      Historical Provider, MD  rosuvastatin (CRESTOR) 10 MG tablet Take 1 tablet (10 mg total) by mouth daily. 05/30/14 05/30/15  Darlyne Russian, MD   BP 160/80  Pulse 48  Temp(Src) 98.1 F (36.7 C) (Oral)  Resp 16  Ht $R'5\' 3"'[place]$  (1.6 m)  Wt 108 lb (48.988 kg)  BMI 19.14 kg/m2  SpO2 100% Physical Exam CONSTITUTIONAL: Well developed/well nourished HEAD: Normocephalic/atraumatic EYES: EOMI/PERRL ENMT: Mucous membranes moist NECK: supple no meningeal signs SPINE:entire spine nontender No bruising/crepitance/stepoffs noted  to spine CV: S1/S2 noted, no murmurs/rubs/gallops noted LUNGS: Lungs are clear to auscultation bilaterally, no apparent distress ABDOMEN: soft, nontender, no rebound or guarding GU:no cva tenderness NEURO: Awake/alert, equal motor 5/5 strength noted with the following: hip flexion/knee flexion/extension, foot dorsi/plantar flexion, great toe extension intact bilaterally, no clonus bilaterally, plantar reflex appropriate (toes downgoing), no sensory deficit in any dermatome.  Equal patellar/achilles reflex noted (2+) in bilateral lower extremities.  Pt is able to ambulate unassisted. EXTREMITIES: pulses normal, full ROM. Tenderness in left posterior thigh/buttock.   SKIN: warm, color normal PSYCH: no abnormalities of mood noted   ED Course  Procedures   EKG done by nursing as patient had initial bradycardia, this resolved She has no weakness/cp/sob  Pt is stable for d/c home.imaging negative Suspect muscle spasm Doubt occult fracture  Imaging Review Dg Hip Complete Left  08/11/2014   CLINICAL DATA:  80 year old female who awoke with deep seated left hip or thigh pain with no known injury. Initial encounter.  EXAM: LEFT HIP - COMPLETE 2+ VIEW  COMPARISON:  None.  FINDINGS: Femoral heads are normally located. Hip joint spaces are preserved. Bone mineralization is within normal limits for age. Pelvis intact. sacral ala and SI joints within normal limits. Proximal right femur appears intact. Proximal left femur is intact with mild subchondral irregularity.  IMPRESSION: No acute osseous abnormality identified at the left hip or pelvis. Mild for age hip joint degeneration.   Electronically Signed   By: Lars Pinks M.D.   On: 08/11/2014 13:51     EKG Interpretation   Date/Time:  Friday August 11 2014 12:53:28 EDT Ventricular Rate:  86 PR Interval:  126 QRS Duration: 70 QT Interval:  356 QTC Calculation: 426 R Axis:   85 Text Interpretation:  Sinus rhythm with marked sinus arrhythmia Low   voltage QRS Borderline ECG No previous ECGs available Confirmed by  [place] ([id number]) on 08/11/2014 1:10:43 PM      MDM   Final diagnoses:  Thigh pain  Muscle spasm of left lower extremity    Nursing notes including past medical history and social history reviewed and considered in documentation xrays reviewed and considered     Sharyon Cable, MD 08/11/14 1402

## 2014-08-14 ENCOUNTER — Ambulatory Visit (HOSPITAL_BASED_OUTPATIENT_CLINIC_OR_DEPARTMENT_OTHER): Payer: Medicare Other | Admitting: Hematology and Oncology

## 2014-08-14 ENCOUNTER — Encounter: Payer: Self-pay | Admitting: Hematology and Oncology

## 2014-08-14 VITALS — BP 150/77 | HR 62 | Temp 97.7°F | Resp 18 | Ht 63.0 in | Wt 107.6 lb

## 2014-08-14 DIAGNOSIS — M810 Age-related osteoporosis without current pathological fracture: Secondary | ICD-10-CM

## 2014-08-14 DIAGNOSIS — C50919 Malignant neoplasm of unspecified site of unspecified female breast: Secondary | ICD-10-CM

## 2014-08-14 DIAGNOSIS — Z853 Personal history of malignant neoplasm of breast: Secondary | ICD-10-CM

## 2014-08-14 NOTE — Assessment & Plan Note (Signed)
Clinically, she has no disease recurrence. She is currently a 5 years cancer survivor. Recent mammogram is normal. We discussed about future follow-up and she is comfortable to just follow up with primary care provider alone. I would discharge the patient from the clinic.

## 2014-08-14 NOTE — Assessment & Plan Note (Signed)
She will continue calcium with vitamin D supplement. She needs bone density recheck in 2 years.

## 2014-08-14 NOTE — Progress Notes (Signed)
[facility] OFFICE PROGRESS NOTE  Patient Care Team: Darlyne Russian, MD as PCP - General (Family Medicine) Peter M Martinique, MD (Cardiology) Roseanne Kaufman, MD (Orthopedic Surgery) Keitha Butte, MD (Dermatology) Heath Lark, MD as Consulting Physician (Hematology and Oncology)   DIAGNOSIS: T1, N0, M0 left breast cancer status post lumpectomy, radiation therapy, and ongoing adjuvant endocrine therapy, complicated by severe osteoporosis  SUMMARY OF ONCOLOGIC HISTORY: In October of 2010, screening mammogram pick up abnormalities on the left breast. Biopsy confirmed cancer and she subsequently underwent left breast lumpectomy on 08/27/2009 with negative margins and no sentinel lymph node involvement. The patient received adjuvant radiation therapy and will start on anastrozole in January 2011. Serial bone density scan suggested the patient has osteoporosis and she will start on calcium, vitamin D and Fosamax. She was offered Prolia but did not tolerate that well. The patient also has a history of Barrett's esophagus but according to her, her last EGD a year ago show no evidence of abnormalities. On 11/23/2013, the patient has made informed consent to stop anastrozole due to severe osteoporosis  INTERVAL HISTORY: [name] 80 y.o. female returns for further followup. She denies any recent abnormal breast examination, palpable mass, abnormal breast appearance or nipple changes Apart from hip pain, she denies recent new symptoms. Her most recent hip x-ray was normal.  REVIEW OF SYSTEMS:   Constitutional: Denies fevers, chills or abnormal weight loss Eyes: Denies blurriness of vision Ears, nose, mouth, throat, and face: Denies mucositis or sore throat Respiratory: Denies cough, dyspnea or wheezes Cardiovascular: Denies palpitation, chest discomfort or lower extremity swelling Gastrointestinal:  Denies nausea, heartburn or change in bowel habits Skin: Denies abnormal  skin rashes Lymphatics: Denies new lymphadenopathy or easy bruising Neurological:Denies numbness, tingling or new weaknesses Behavioral/Psych: Mood is stable, no new changes  All other systems were reviewed with the patient and are negative.  I have reviewed the past medical history, past surgical history, social history and family history with the patient and they are unchanged from previous note.  ALLERGIES:  is allergic to daypro.  MEDICATIONS:  Current Outpatient Prescriptions  Medication Sig Dispense Refill  . Calcium Carbonate-Vitamin D (CALCIUM + D PO) Take 1 capsule by mouth 3 (three) times daily.     Marland Kitchen latanoprost (XALATAN) 0.005 % ophthalmic solution Place 1 drop into both eyes at bedtime.      . rosuvastatin (CRESTOR) 10 MG tablet Take 1 tablet (10 mg total) by mouth daily. 90 tablet 3   No current facility-administered medications for this visit.    PHYSICAL EXAMINATION: ECOG PERFORMANCE STATUS: 0 - Asymptomatic  Filed Vitals:   08/14/14 0910  BP: 150/77  Pulse: 62  Temp: 97.7 F (36.5 C)  Resp: 18   Filed Weights   08/14/14 0910  Weight: 107 lb 9.6 oz (48.807 kg)    GENERAL:alert, no distress and comfortable. She looks thin SKIN: skin color, texture, turgor are normal, no rashes or significant lesions EYES: normal, Conjunctiva are pink and non-injected, sclera clear OROPHARYNX:no exudate, no erythema and lips, buccal mucosa, and tongue normal  NECK: supple, thyroid normal size, non-tender, without nodularity LYMPH:  no palpable lymphadenopathy in the cervical, axillary or inguinal LUNGS: clear to auscultation and percussion with normal breathing effort HEART: regular rate & rhythm and no murmurs and no lower extremity edema ABDOMEN:abdomen soft, non-tender and normal bowel sounds Musculoskeletal:no cyanosis of digits and no clubbing  NEURO: alert & oriented x 3 with fluent speech, no  focal motor/sensory deficits Bilateral breast examination were performed.  Well-healed lumpectomy scar with no other abnormalities. LABORATORY DATA:  I have reviewed the data as listed    Component Value Date/Time   NA 136 10/25/2013 0819   NA 140 03/18/2013 0900   NA 140 07/09/2011   K 4.7 10/25/2013 0819   K 4.4 03/18/2013 0900   CL 99 10/25/2013 0819   CL 105 03/18/2013 0900   CO2 26 10/25/2013 0819   CO2 25 03/18/2013 0900   GLUCOSE 80 10/25/2013 0819   GLUCOSE 95 03/18/2013 0900   BUN 20 10/25/2013 0819   BUN 20.2 03/18/2013 0900   CREATININE 1.00 10/25/2013 0819   CREATININE 1.0 03/18/2013 0900   CREATININE 1.02 05/13/2012 0921   CALCIUM 9.9 10/25/2013 0819   CALCIUM 9.6 03/18/2013 0900   PROT 6.8 09/20/2013 0858   PROT 6.7 03/18/2013 0900   ALBUMIN 3.9 09/20/2013 0858   ALBUMIN 3.3* 03/18/2013 0900   AST 20 09/20/2013 0858   AST 21 03/18/2013 0900   ALT 13 09/20/2013 0858   ALT 12 03/18/2013 0900   ALKPHOS 76 09/20/2013 0858   ALKPHOS 80 03/18/2013 0900   BILITOT 0.4 09/20/2013 0858   BILITOT 0.36 03/18/2013 0900   GFRNONAA 41* 09/20/2013 0858   GFRNONAA 50* 08/22/2009 1430   GFRAA 47* 09/20/2013 0858   GFRAA  08/22/2009 1430    >60        The eGFR has been calculated using the MDRD equation. This calculation has not been validated in all clinical situations. eGFR's persistently <60 mL/min signify possible Chronic Kidney Disease.    No results found for: SPEP, UPEP  Lab Results  Component Value Date   WBC 8.6 09/20/2013   NEUTROABS 6.4 09/20/2013   HGB 12.7 09/20/2013   HCT 36.8 09/20/2013   MCV 85.4 09/20/2013   PLT 256 09/20/2013      Chemistry      Component Value Date/Time   NA 136 10/25/2013 0819   NA 140 03/18/2013 0900   NA 140 07/09/2011   K 4.7 10/25/2013 0819   K 4.4 03/18/2013 0900   CL 99 10/25/2013 0819   CL 105 03/18/2013 0900   CO2 26 10/25/2013 0819   CO2 25 03/18/2013 0900   BUN 20 10/25/2013 0819   BUN 20.2 03/18/2013 0900   CREATININE 1.00 10/25/2013 0819   CREATININE 1.0 03/18/2013 0900    CREATININE 1.02 05/13/2012 0921   GLU 47 07/09/2011      Component Value Date/Time   CALCIUM 9.9 10/25/2013 0819   CALCIUM 9.6 03/18/2013 0900   ALKPHOS 76 09/20/2013 0858   ALKPHOS 80 03/18/2013 0900   AST 20 09/20/2013 0858   AST 21 03/18/2013 0900   ALT 13 09/20/2013 0858   ALT 12 03/18/2013 0900   BILITOT 0.4 09/20/2013 0858   BILITOT 0.36 03/18/2013 0900     ASSESSMENT & PLAN:  Breast cancer Clinically, she has no disease recurrence. She is currently a 5 years cancer survivor. Recent mammogram is normal. We discussed about future follow-up and she is comfortable to just follow up with primary care provider alone. I would discharge the patient from the clinic.  Osteoporosis She will continue calcium with vitamin D supplement. She needs bone density recheck in 2 years.   No orders of the defined types were placed in this encounter.   All questions were answered. The patient knows to call the clinic with any problems, questions or concerns. No barriers to learning was  detected. I spent 15 minutes counseling the patient face to face. The total time spent in the appointment was 20 minutes and more than 50% was on counseling and review of test results     [facility], [place], MD 08/14/2014 9:22 AM

## 2014-08-24 ENCOUNTER — Encounter (HOSPITAL_BASED_OUTPATIENT_CLINIC_OR_DEPARTMENT_OTHER): Payer: Self-pay | Admitting: *Deleted

## 2014-08-24 ENCOUNTER — Emergency Department (HOSPITAL_BASED_OUTPATIENT_CLINIC_OR_DEPARTMENT_OTHER)
Admission: EM | Admit: 2014-08-24 | Discharge: 2014-08-24 | Disposition: A | Discharge status: Home / Self Care | Payer: Medicare Other | Attending: Emergency Medicine | Admitting: Emergency Medicine

## 2014-08-24 ENCOUNTER — Emergency Department (HOSPITAL_BASED_OUTPATIENT_CLINIC_OR_DEPARTMENT_OTHER): Payer: Medicare Other

## 2014-08-24 DIAGNOSIS — Z8719 Personal history of other diseases of the digestive system: Secondary | ICD-10-CM | POA: Diagnosis not present

## 2014-08-24 DIAGNOSIS — Z79899 Other long term (current) drug therapy: Secondary | ICD-10-CM | POA: Diagnosis not present

## 2014-08-24 DIAGNOSIS — E785 Hyperlipidemia, unspecified: Secondary | ICD-10-CM | POA: Diagnosis not present

## 2014-08-24 DIAGNOSIS — Z8739 Personal history of other diseases of the musculoskeletal system and connective tissue: Secondary | ICD-10-CM | POA: Diagnosis not present

## 2014-08-24 DIAGNOSIS — I10 Essential (primary) hypertension: Secondary | ICD-10-CM | POA: Insufficient documentation

## 2014-08-24 DIAGNOSIS — Z87891 Personal history of nicotine dependence: Secondary | ICD-10-CM | POA: Insufficient documentation

## 2014-08-24 DIAGNOSIS — R42 Dizziness and giddiness: Secondary | ICD-10-CM | POA: Diagnosis present

## 2014-08-24 DIAGNOSIS — Z853 Personal history of malignant neoplasm of breast: Secondary | ICD-10-CM | POA: Insufficient documentation

## 2014-08-24 DIAGNOSIS — Z8541 Personal history of malignant neoplasm of cervix uteri: Secondary | ICD-10-CM | POA: Insufficient documentation

## 2014-08-24 DIAGNOSIS — Z8781 Personal history of (healed) traumatic fracture: Secondary | ICD-10-CM | POA: Insufficient documentation

## 2014-08-24 DIAGNOSIS — H409 Unspecified glaucoma: Secondary | ICD-10-CM | POA: Insufficient documentation

## 2014-08-24 DIAGNOSIS — Z8701 Personal history of pneumonia (recurrent): Secondary | ICD-10-CM | POA: Diagnosis not present

## 2014-08-24 DIAGNOSIS — H81399 Other peripheral vertigo, unspecified ear: Secondary | ICD-10-CM | POA: Diagnosis not present

## 2014-08-24 LAB — CBC
HCT: 39.4 % (ref 36.0–46.0)
Hemoglobin: 12.8 g/dL (ref 12.0–15.0)
MCH: 29.5 pg (ref 26.0–34.0)
MCHC: 32.5 g/dL (ref 30.0–36.0)
MCV: 90.8 fL (ref 78.0–100.0)
Platelets: 242 10*3/uL (ref 150–400)
RBC: 4.34 MIL/uL (ref 3.87–5.11)
RDW: 15 % (ref 11.5–15.5)
WBC: 7.3 10*3/uL (ref 4.0–10.5)

## 2014-08-24 LAB — COMPREHENSIVE METABOLIC PANEL
ALT: 16 U/L (ref 0–35)
AST: 24 U/L (ref 0–37)
Albumin: 3.7 g/dL (ref 3.5–5.2)
Alkaline Phosphatase: 110 U/L (ref 39–117)
Anion gap: 13 (ref 5–15)
BUN: 20 mg/dL (ref 6–23)
CO2: 25 mEq/L (ref 19–32)
Calcium: 10.4 mg/dL (ref 8.4–10.5)
Chloride: 104 mEq/L (ref 96–112)
Creatinine, Ser: 0.8 mg/dL (ref 0.50–1.10)
GFR calc Af Amer: 78 mL/min — ABNORMAL LOW (ref >90)
GFR calc non Af Amer: 67 mL/min — ABNORMAL LOW (ref >90)
Glucose, Bld: 80 mg/dL (ref 70–99)
Potassium: 4.3 mEq/L (ref 3.7–5.3)
Sodium: 142 mEq/L (ref 137–147)
Total Bilirubin: 0.3 mg/dL (ref 0.3–1.2)
Total Protein: 7.7 g/dL (ref 6.0–8.3)

## 2014-08-24 LAB — URINALYSIS, ROUTINE W REFLEX MICROSCOPIC
Bilirubin Urine: NEGATIVE
Glucose, UA: NEGATIVE mg/dL
Hgb urine dipstick: NEGATIVE
Ketones, ur: NEGATIVE mg/dL
Nitrite: NEGATIVE
Protein, ur: NEGATIVE mg/dL
Specific Gravity, Urine: 1.007 (ref 1.005–1.030)
Urobilinogen, UA: 0.2 mg/dL (ref 0.0–1.0)
pH: 7 (ref 5.0–8.0)

## 2014-08-24 LAB — URINE MICROSCOPIC-ADD ON

## 2014-08-24 LAB — DIFFERENTIAL
Basophils Absolute: 0 10*3/uL (ref 0.0–0.1)
Basophils Relative: 0 % (ref 0–1)
Eosinophils Absolute: 0.1 10*3/uL (ref 0.0–0.7)
Eosinophils Relative: 2 % (ref 0–5)
Lymphocytes Relative: 20 % (ref 12–46)
Lymphs Abs: 1.5 10*3/uL (ref 0.7–4.0)
Monocytes Absolute: 0.5 10*3/uL (ref 0.1–1.0)
Monocytes Relative: 7 % (ref 3–12)
Neutro Abs: 5.2 10*3/uL (ref 1.7–7.7)
Neutrophils Relative %: 71 % (ref 43–77)

## 2014-08-24 LAB — PROTIME-INR
INR: 0.96 (ref 0.00–1.49)
Prothrombin Time: 12.8 seconds (ref 11.6–15.2)

## 2014-08-24 LAB — TROPONIN I: Troponin I: <0.3 ng/mL (ref <0.30)

## 2014-08-24 MED ORDER — MECLIZINE HCL 25 MG PO TABS: 25.0000 mg | ORAL_TABLET | Freq: Three times a day (TID) | ORAL | Status: DC | PRN

## 2014-08-24 MED ORDER — MECLIZINE HCL 25 MG PO TABS
25.0000 mg | ORAL_TABLET | Freq: Three times a day (TID) | ORAL | Status: DC | PRN
  Administered 2014-08-24: 25 mg via ORAL
  Filled 2014-08-24: qty 1

## 2014-08-24 MED ORDER — ONDANSETRON 4 MG PO TBDP
4.0000 mg | ORAL_TABLET | Freq: Once | ORAL | Status: AC
  Administered 2014-08-24: 4 mg via ORAL
  Filled 2014-08-24: qty 1

## 2014-08-24 NOTE — ED Provider Notes (Signed)
CSN: [id number]     Arrival date & time 08/24/14  1412 History   First MD Initiated Contact with Patient 08/24/14 1605     Chief Complaint  Patient presents with  . Dizziness     HPI Pt noticed yesterday morning at 4 am that her vision went dark. Everything looked like a blob.  The rest of the day she felt fine but when she lowered her head to the bed the same thing happened.  She noticed it again today when she turned her head to the side.  Her clock was spinning and she felt nauseated.  These episodes occur when lying flat.  No trouble with balance or coordination. No trouble with weakness.  No trouble with slurred speech.  Her vision is normal for her right now other than some trouble with floaters but she has had that for a while and has seen Dr Bing Plume Past Medical History  Diagnosis Date  . Autoimmune disease   . Dysphagia   . Hyperlipidemia   . Breast cancer 2010    cT1 N0 M0; ER+/ PR+/ HER2 neg; s/p lumpectomy 07/2010; started adjuvant hormonal therapy 10/2009  . Barrett's esophagus   . Osteopenia   . Pneumonia   . Cervical cancer   . Neuromuscular disorder   . Hypertension   . Glaucoma   . Bone lesion     sacrum  . Osteoporosis   . Osteoporosis 03/18/2012  . Humerus fracture 03/2012    impacted left humueral neck fracture, treated by Dr. Amedeo Plenty  . Fx distal radius NEC-closed   . Fracture of left shoulder    Past Surgical History  Procedure Laterality Date  . Hand surgery    . Ovarian cyst removal  1978  . Appendectomy    . Breast lumpectomy    . Ortho procedures      multiple  . Abdominal hysterectomy  1972    cancer          1 tube 1 ovary  . Shoulder surgery  1954    left  . Colon polyp removal     Family History  Problem Relation Age of Onset  . Heart disease Father   . Cancer Sister     lung, kidney  . Cancer Brother     brain  . Cancer Paternal Aunt     breast  . Cancer Brother     liver, lung   History  Substance Use Topics  . Smoking status:  Former Smoker    Quit date: 12/16/1986  . Smokeless tobacco: Never Used     Comment: FORMER SMOKER 30 YEARS AGO  . Alcohol Use: Yes     Comment: OCCASIONALLY   OB History    No data available     Review of Systems  All other systems reviewed and are negative.     Allergies  Daypro  Home Medications   Prior to Admission medications   Medication Sig Start Date End Date Taking? Authorizing Provider  Calcium Carbonate-Vitamin D (CALCIUM + D PO) Take 1 capsule by mouth 3 (three) times daily.     Historical Provider, MD  latanoprost (XALATAN) 0.005 % ophthalmic solution Place 1 drop into both eyes at bedtime.      Historical Provider, MD  meclizine (ANTIVERT) 25 MG tablet Take 1 tablet (25 mg total) by mouth 3 (three) times daily as needed for dizziness or nausea. 08/24/14   Dorie Rank, MD  rosuvastatin (CRESTOR) 10 MG tablet Take 1 tablet (  10 mg total) by mouth daily. 05/30/14 05/30/15  Darlyne Russian, MD   BP 151/85 mmHg  Pulse 59  Temp(Src) 97.8 F (36.6 C) (Oral)  Resp 19  Ht _0  (1.6 m)  Wt 106 lb (48.081 kg)  BMI 18.78 kg/m2  SpO2 100% Physical Exam  Constitutional: She is oriented to person, place, and time. She appears well-developed and well-nourished. No distress.  HENT:  Head: Normocephalic and atraumatic.  Right Ear: External ear normal.  Left Ear: External ear normal.  Mouth/Throat: Oropharynx is clear and moist.  Eyes: Conjunctivae are normal. Right eye exhibits no discharge. Left eye exhibits no discharge. No scleral icterus.  Neck: Neck supple. No tracheal deviation present.  Cardiovascular: Normal rate, regular rhythm and intact distal pulses.   Pulmonary/Chest: Effort normal and breath sounds normal. No stridor. No respiratory distress. She has no wheezes. She has no rales.  Abdominal: Soft. Bowel sounds are normal. She exhibits no distension. There is no tenderness. There is no rebound and no guarding.  Musculoskeletal: She exhibits no edema or tenderness.   Neurological: She is alert and oriented to person, place, and time. She has normal strength. No cranial nerve deficit (No facial droop, extraocular movements intact, tongue midline ) or sensory deficit. She exhibits normal muscle tone. She displays no seizure activity. Coordination normal.  No pronator drift bilateral upper extrem, able to hold both legs off bed for 5 seconds, sensation intact in all extremities, no visual field cuts, no left or right sided neglect, normal finger-nose exam bilaterally, no nystagmus noted  NIHSS 0   Skin: Skin is warm and dry. No rash noted.  Psychiatric: She has a normal mood and affect.  Nursing note and vitals reviewed.   ED Course  Procedures (including critical care time) Labs Review Labs Reviewed  URINALYSIS, ROUTINE W REFLEX MICROSCOPIC - Abnormal; Notable for the following:    Leukocytes, UA TRACE (*)    All other components within normal limits  COMPREHENSIVE METABOLIC PANEL - Abnormal; Notable for the following:    GFR calc non Af Amer 67 (*)    GFR calc Af Amer 78 (*)    All other components within normal limits  URINE MICROSCOPIC-ADD ON  PROTIME-INR  CBC  DIFFERENTIAL  TROPONIN I    Imaging Review Ct Head Wo Contrast  08/24/2014   CLINICAL DATA:  Initial evaluation for dizziness with nausea and vomiting today, no known injury  EXAM: CT HEAD WITHOUT CONTRAST  TECHNIQUE: Contiguous axial images were obtained from the base of the skull through the vertex without intravenous contrast.  COMPARISON:  02/01/2010  FINDINGS: There is moderate diffuse atrophy. There is mild low attenuation in the deep white matter. There is no evidence of vascular territory infarct or mass. There is no hydrocephalus, hemorrhage, or extra-axial fluid. The calvarium is intact. No significant inflammatory change appreciated in the visualized portions of the paranasal sinuses.  IMPRESSION: Age-related involutional change with no acute findings   Electronically Signed    By: Skipper Cliche M.D.   On: 08/24/2014 17:07     EKG Interpretation   Date/Time:  Thursday August 24 2014 16:28:10 EST Ventricular Rate:  69 PR Interval:  134 QRS Duration: 68 QT Interval:  416 QTC Calculation: 445 R Axis:   100 Text Interpretation:  Sinus rhythm with Premature atrial complexes  Rightward axis Borderline ECG No significant change since last tracing  Confirmed by Anessia Oakland  MD-J, Caelie Remsburg ([id number]) on 08/24/2014 4:36:51 PM  MDM   Final diagnoses:  Peripheral vertigo, unspecified laterality    Patient's symptoms improved while she was in the emergency department. I suspect that her symptoms are related to peripheral vertigo. There is a clear positional component. Her neurologic exam is normal.  We discussed Epley's maneuver and outpatient follow-up with her primary care doctor. Discharge home with prescription for meclizine. Warning signs and precautions were discussed.    Dorie Rank, MD 08/24/14 [phone]

## 2014-08-24 NOTE — ED Notes (Signed)
Pt c/o dizziness/ nausea  in the am and while laying down  X 2 days

## 2014-08-24 NOTE — Discharge Instructions (Signed)
Vertigo Vertigo means you feel like you or your surroundings are moving when they are not. Vertigo can be dangerous if it occurs when you are at work, driving, or performing difficult activities.  CAUSES  Vertigo occurs when there is a conflict of signals sent to your brain from the visual and sensory systems in your body. There are many different causes of vertigo, including: 1. Infections, especially in the inner ear. 2. A bad reaction to a drug or misuse of alcohol and medicines. 3. Withdrawal from drugs or alcohol. 4. Rapidly changing positions, such as lying down or rolling over in bed. 5. A migraine headache. 6. Decreased blood flow to the brain. 7. Increased pressure in the brain from a head injury, infection, tumor, or bleeding. SYMPTOMS  You may feel as though the world is spinning around or you are falling to the ground. Because your balance is upset, vertigo can cause nausea and vomiting. You may have involuntary eye movements (nystagmus). DIAGNOSIS  Vertigo is usually diagnosed by physical exam. If the cause of your vertigo is unknown, your caregiver may perform imaging tests, such as an MRI scan (magnetic resonance imaging). TREATMENT  Most cases of vertigo resolve on their own, without treatment. Depending on the cause, your caregiver may prescribe certain medicines. If your vertigo is related to body position issues, your caregiver may recommend movements or procedures to correct the problem. In rare cases, if your vertigo is caused by certain inner ear problems, you may need surgery. HOME CARE INSTRUCTIONS   Follow your caregiver's instructions.  Avoid driving.  Avoid operating heavy machinery.  Avoid performing any tasks that would be dangerous to you or others during a vertigo episode.  Tell your caregiver if you notice that certain medicines seem to be causing your vertigo. Some of the medicines used to treat vertigo episodes can actually make them worse in some  people. SEEK IMMEDIATE MEDICAL CARE IF:   Your medicines do not relieve your vertigo or are making it worse.  You develop problems with talking, walking, weakness, or using your arms, hands, or legs.  You develop severe headaches.  Your nausea or vomiting continues or gets worse.  You develop visual changes.  A family member notices behavioral changes.  Your condition gets worse. MAKE SURE YOU:  Understand these instructions.  Will watch your condition.  Will get help right away if you are not doing well or get worse. Document Released: 07/09/2005 Document Revised: 12/22/2011 Document Reviewed: 04/17/2011 [facility] Patient Information 2015 [place], [place]. This information is not intended to replace advice given to you by your health care provider. Make sure you discuss any questions you have with your health care provider.  Epley Maneuver Self-Care WHAT IS THE EPLEY MANEUVER? The Epley maneuver is an exercise you can do to relieve symptoms of benign paroxysmal positional vertigo (BPPV). This condition is often just referred to as vertigo. BPPV is caused by the movement of tiny crystals (canaliths) inside your inner ear. The accumulation and movement of canaliths in your inner ear causes a sudden spinning sensation (vertigo) when you move your head to certain positions. Vertigo usually lasts about 30 seconds. BPPV usually occurs in just one ear. If you get vertigo when you lie on your left side, you probably have BPPV in your left ear. Your health care provider can tell you which ear is involved.  BPPV may be caused by a head injury. Many people older than 50 get BPPV for unknown reasons. If you have  been diagnosed with BPPV, your health care provider may teach you how to do this maneuver. BPPV is not life threatening (benign) and usually goes away in time.  WHEN SHOULD I PERFORM THE EPLEY MANEUVER? You can do this maneuver at home whenever you have symptoms of vertigo. You may do the  Epley maneuver up to 3 times a day until your symptoms of vertigo go away. HOW SHOULD I DO THE EPLEY MANEUVER? 8. Sit on the edge of a bed or table with your back straight. Your legs should be extended or hanging over the edge of the bed or table.  9. Turn your head halfway toward the affected ear.  10. Lie backward quickly with your head turned until you are lying flat on your back. You may want to position a pillow under your shoulders.  11. Hold this position for 30 seconds. You may experience an attack of vertigo. This is normal. Hold this position until the vertigo stops. 12. Then turn your head to the opposite direction until your unaffected ear is facing the floor.  13. Hold this position for 30 seconds. You may experience an attack of vertigo. This is normal. Hold this position until the vertigo stops. 14. Now turn your whole body to the same side as your head. Hold for another 30 seconds.  15. You can then sit back up. ARE THERE RISKS TO THIS MANEUVER? In some cases, you may have other symptoms (such as changes in your vision, weakness, or numbness). If you have these symptoms, stop doing the maneuver and call your health care provider. Even if doing these maneuvers relieves your vertigo, you may still have dizziness. Dizziness is the sensation of light-headedness but without the sensation of movement. Even though the Epley maneuver may relieve your vertigo, it is possible that your symptoms will return within 5 years. WHAT SHOULD I DO AFTER THIS MANEUVER? After doing the Epley maneuver, you can return to your normal activities. Ask your doctor if there is anything you should do at home to prevent vertigo. This may include:  Sleeping with two or more pillows to keep your head elevated.  Not sleeping on the side of your affected ear.  Getting up slowly from bed.  Avoiding sudden movements during the day.  Avoiding extreme head movement, like looking up or bending over.  Wearing  a cervical collar to prevent sudden head movements. WHAT SHOULD I DO IF MY SYMPTOMS GET WORSE? Call your health care provider if your vertigo gets worse. Call your provider right way if you have other symptoms, including:   Nausea.  Vomiting.  Headache.  Weakness.  Numbness.  Vision changes. Document Released: 10/04/2013 Document Reviewed: 10/04/2013 [facility] Patient Information 2015 [place], [place]. This information is not intended to replace advice given to you by your health care provider. Make sure you discuss any questions you have with your health care provider.

## 2014-08-31 ENCOUNTER — Ambulatory Visit (INDEPENDENT_AMBULATORY_CARE_PROVIDER_SITE_OTHER): Payer: Medicare Other | Admitting: Emergency Medicine

## 2014-08-31 ENCOUNTER — Encounter: Payer: Self-pay | Admitting: Emergency Medicine

## 2014-08-31 VITALS — BP 108/66 | HR 61 | Temp 97.6°F | Resp 16 | Ht 62.0 in | Wt 106.2 lb

## 2014-08-31 DIAGNOSIS — E785 Hyperlipidemia, unspecified: Secondary | ICD-10-CM

## 2014-08-31 LAB — LIPID PANEL
Cholesterol: 162 mg/dL (ref 0–200)
HDL: 72 mg/dL (ref >39)
LDL Cholesterol: 78 mg/dL (ref 0–99)
Total CHOL/HDL Ratio: 2.3 Ratio
Triglycerides: 58 mg/dL (ref <150)
VLDL: 12 mg/dL (ref 0–40)

## 2014-08-31 NOTE — Progress Notes (Signed)
Subjective:  This chart was scribed for Darlyne Russian, MD by Ladene Artist, ED Scribe. The patient was seen in room 21. Patient's care was started at 11:02 AM.   Patient ID: [name], female    DOB: 1931-05-06, 80 y.o.   MRN: [id number]  Chief Complaint  Patient presents with  . Follow-up  . fasting bloodwork   HPI HPI Comments: [name] is a 80 y.o. female, with a h/o osteoporosis, hyperlipidemia, CA, HTN, who presents to the [facility] for follow-up regarding ED visit. Pt was seen in [facility] ED on 08/24/14 for vertigo. Pt states that she initially experienced dizziness, blurred vision and 1 episode of emesis around 4 AM on 08/24/14. She states that symptoms were persistent with lying down but resolved once she sat in an upright position. Pt denied symptoms with bending over. CT was obtained in ED that was normal and showed no signs of stroke. Physician's suspected that symptoms were inner ear related. Pt reports that she has not experienced any symptoms over the past 2 days.   Preventative Maintenance  Pt reports having mammogram annually. Pt states that she saw Dr. Alvy Bimler at the beginning of the month following her mammogram. Pt was released from Dr. Alvy Bimler since she has completed all of her medications and has been CA free for 5 years now.   Immunizations Pt's immunizations are UTD.   Current Medications Pt currently only takes Crestor, Calcium and eye drops.   Past Medical History  Diagnosis Date  . Autoimmune disease   . Dysphagia   . Hyperlipidemia   . Breast cancer 2010    cT1 N0 M0; ER+/ PR+/ HER2 neg; s/p lumpectomy 07/2010; started adjuvant hormonal therapy 10/2009  . Barrett's esophagus   . Osteopenia   . Pneumonia   . Cervical cancer   . Neuromuscular disorder   . Hypertension   . Glaucoma   . Bone lesion     sacrum  . Osteoporosis   . Osteoporosis 03/18/2012  . Humerus fracture 03/2012    impacted left humueral neck fracture, treated  by Dr. Amedeo Plenty  . Fx distal radius NEC-closed   . Fracture of left shoulder    Current Outpatient Prescriptions on File Prior to Visit  Medication Sig Dispense Refill  . Calcium Carbonate-Vitamin D (CALCIUM + D PO) Take 1 capsule by mouth 3 (three) times daily.     Marland Kitchen latanoprost (XALATAN) 0.005 % ophthalmic solution Place 1 drop into both eyes at bedtime.      . rosuvastatin (CRESTOR) 10 MG tablet Take 1 tablet (10 mg total) by mouth daily. 90 tablet 3  . meclizine (ANTIVERT) 25 MG tablet Take 1 tablet (25 mg total) by mouth 3 (three) times daily as needed for dizziness or nausea. 21 tablet 0   No current facility-administered medications on file prior to visit.   Allergies  Allergen Reactions  . Daypro [Oxaprozin] Swelling    Face and tongue   Review of Systems  Constitutional: Negative for fever and chills.  Eyes: Negative for visual disturbance.  Respiratory: Negative for shortness of breath.   Cardiovascular: Negative for chest pain.  Neurological: Negative for dizziness and light-headedness.      Objective:   Physical Exam CONSTITUTIONAL: Well developed/well nourished HEAD: Normocephalic/atraumatic EYES: EOMI/PERRL ENMT: Mucous membranes moist NECK: supple no meningeal signs SPINE/BACK:entire spine nontender CV: S1/S2 noted, no murmurs/rubs/gallops noted LUNGS: Lungs are clear to auscultation bilaterally, no apparent distress ABDOMEN: soft, nontender, no  rebound or guarding, bowel sounds noted throughout abdomen GU:no cva tenderness NEURO: Pt is awake/alert/appropriate, moves all extremitiesx4.  No facial droop.   EXTREMITIES: pulses normal/equal, full ROM SKIN: warm, color normal PSYCH: no abnormalities of mood noted, alert and oriented to situation    Assessment & Plan:  Pt UTD on immunizations. She has been released by oncologist regarding breast CA treatment. She is now 5 years CA free. Recent episode of vertigo has essentially resolved. She continues on  cholesterol medications, Calcium and eye drops as her only medications. Recheck in 6 months.  I personally performed the services described in this documentation, which was scribed in my presence. The recorded information has been reviewed and is accurate.

## 2015-02-05 ENCOUNTER — Emergency Department (HOSPITAL_BASED_OUTPATIENT_CLINIC_OR_DEPARTMENT_OTHER)
Admission: EM | Admit: 2015-02-05 | Discharge: 2015-02-05 | Disposition: A | Discharge status: Home / Self Care | Payer: Medicare Other | Attending: Emergency Medicine | Admitting: Emergency Medicine

## 2015-02-05 ENCOUNTER — Emergency Department (HOSPITAL_BASED_OUTPATIENT_CLINIC_OR_DEPARTMENT_OTHER): Payer: Medicare Other

## 2015-02-05 ENCOUNTER — Encounter (HOSPITAL_BASED_OUTPATIENT_CLINIC_OR_DEPARTMENT_OTHER): Payer: Self-pay | Admitting: *Deleted

## 2015-02-05 DIAGNOSIS — S4991XA Unspecified injury of right shoulder and upper arm, initial encounter: Secondary | ICD-10-CM | POA: Diagnosis present

## 2015-02-05 DIAGNOSIS — Z8541 Personal history of malignant neoplasm of cervix uteri: Secondary | ICD-10-CM | POA: Insufficient documentation

## 2015-02-05 DIAGNOSIS — Z79899 Other long term (current) drug therapy: Secondary | ICD-10-CM | POA: Diagnosis not present

## 2015-02-05 DIAGNOSIS — S42251A Displaced fracture of greater tuberosity of right humerus, initial encounter for closed fracture: Secondary | ICD-10-CM | POA: Diagnosis not present

## 2015-02-05 DIAGNOSIS — Y92830 Public park as the place of occurrence of the external cause: Secondary | ICD-10-CM | POA: Insufficient documentation

## 2015-02-05 DIAGNOSIS — Y999 Unspecified external cause status: Secondary | ICD-10-CM | POA: Insufficient documentation

## 2015-02-05 DIAGNOSIS — Z8701 Personal history of pneumonia (recurrent): Secondary | ICD-10-CM | POA: Diagnosis not present

## 2015-02-05 DIAGNOSIS — Z8669 Personal history of other diseases of the nervous system and sense organs: Secondary | ICD-10-CM | POA: Insufficient documentation

## 2015-02-05 DIAGNOSIS — Z87891 Personal history of nicotine dependence: Secondary | ICD-10-CM | POA: Diagnosis not present

## 2015-02-05 DIAGNOSIS — E785 Hyperlipidemia, unspecified: Secondary | ICD-10-CM | POA: Insufficient documentation

## 2015-02-05 DIAGNOSIS — Z853 Personal history of malignant neoplasm of breast: Secondary | ICD-10-CM | POA: Insufficient documentation

## 2015-02-05 DIAGNOSIS — Z8781 Personal history of (healed) traumatic fracture: Secondary | ICD-10-CM | POA: Insufficient documentation

## 2015-02-05 DIAGNOSIS — W010XXA Fall on same level from slipping, tripping and stumbling without subsequent striking against object, initial encounter: Secondary | ICD-10-CM | POA: Insufficient documentation

## 2015-02-05 DIAGNOSIS — Y9301 Activity, walking, marching and hiking: Secondary | ICD-10-CM | POA: Diagnosis not present

## 2015-02-05 DIAGNOSIS — Z8719 Personal history of other diseases of the digestive system: Secondary | ICD-10-CM | POA: Diagnosis not present

## 2015-02-05 DIAGNOSIS — I10 Essential (primary) hypertension: Secondary | ICD-10-CM | POA: Diagnosis not present

## 2015-02-05 DIAGNOSIS — Z8739 Personal history of other diseases of the musculoskeletal system and connective tissue: Secondary | ICD-10-CM | POA: Diagnosis not present

## 2015-02-05 DIAGNOSIS — S42291A Other displaced fracture of upper end of right humerus, initial encounter for closed fracture: Secondary | ICD-10-CM | POA: Insufficient documentation

## 2015-02-05 DIAGNOSIS — H409 Unspecified glaucoma: Secondary | ICD-10-CM | POA: Insufficient documentation

## 2015-02-05 DIAGNOSIS — W19XXXA Unspecified fall, initial encounter: Secondary | ICD-10-CM

## 2015-02-05 DIAGNOSIS — S42201A Unspecified fracture of upper end of right humerus, initial encounter for closed fracture: Secondary | ICD-10-CM

## 2015-02-05 MED ORDER — HYDROCODONE-ACETAMINOPHEN 5-325 MG PO TABS: 1.0000 | ORAL_TABLET | Freq: Four times a day (QID) | ORAL | Status: DC | PRN

## 2015-02-05 MED ORDER — HYDROCODONE-ACETAMINOPHEN 5-325 MG PO TABS
2.0000 | ORAL_TABLET | Freq: Once | ORAL | Status: AC
  Administered 2015-02-05: 2 via ORAL
  Filled 2015-02-05: qty 2

## 2015-02-05 NOTE — Discharge Instructions (Signed)
Wear sling and swath as applied.  Ice for 20 minutes every 2 hours while awake for the next 2 days.  Hydrocodone as prescribed as needed for pain.  Follow-up with your orthopedist in one week for a recheck, and return to the ER if your symptoms significantly worsen or change.   Humerus Fracture, Treated with Immobilization The humerus is the large bone in your upper arm. You have a broken (fractured) humerus. These fractures are easily diagnosed with X-rays. TREATMENT  Simple fractures which will heal without disability are treated with simple immobilization. Immobilization means you will wear a cast, splint, or sling. You have a fracture which will do well with immobilization. The fracture will heal well simply by being held in a good position until it is stable enough to begin range of motion exercises. Do not take part in activities which would further injure your arm.  HOME CARE INSTRUCTIONS   Put ice on the injured area.  Put ice in a plastic bag.  Place a towel between your skin and the bag.  Leave the ice on for 15-20 minutes, 03-04 times a day.  If you have a cast:  Do not scratch the skin under the cast using sharp or pointed objects.  Check the skin around the cast every day. You may put lotion on any red or sore areas.  Keep your cast dry and clean.  If you have a splint:  Wear the splint as directed.  Keep your splint dry and clean.  You may loosen the elastic around the splint if your fingers become numb, tingle, or turn cold or blue.  If you have a sling:  Wear the sling as directed.  Do not put pressure on any part of your cast or splint until it is fully hardened.  Your cast or splint can be protected during bathing with a plastic bag. Do not lower the cast or splint into water.  Only take over-the-counter or prescription medicines for pain, discomfort, or fever as directed by your caregiver.  Do range of motion exercises as instructed by your  caregiver.  Follow up as directed by your caregiver. This is very important in order to avoid permanent injury or disability and chronic pain. SEEK IMMEDIATE MEDICAL CARE IF:   Your skin or nails in the injured arm turn blue or gray.  Your arm feels cold or numb.  You develop severe pain in the injured arm.  You are having problems with the medicines you were given. MAKE SURE YOU:   Understand these instructions.  Will watch your condition.  Will get help right away if you are not doing well or get worse. Document Released: 01/05/2001 Document Revised: 12/22/2011 Document Reviewed: 11/13/2010 [facility] Patient Information 2015 [place], [place]. This information is not intended to replace advice given to you by your health care provider. Make sure you discuss any questions you have with your health care provider.

## 2015-02-05 NOTE — ED Notes (Signed)
MD at bedside. 

## 2015-02-05 NOTE — ED Notes (Signed)
Pt refusing to get on the stretcher at this time. Sts that the pain is too much.

## 2015-02-05 NOTE — ED Notes (Signed)
Stepped in a hole and fell. Injury to her right arm.

## 2015-02-05 NOTE — ED Provider Notes (Addendum)
CSN: [id number]     Arrival date & time 02/05/15  1640 History  This chart was scribed for Veryl Speak, MD by Stephania Fragmin, ED Scribe. This patient was seen in room MH11/MH11 and the patient's care was started at 6:09 PM.    Chief Complaint  Patient presents with  . Fall   Patient is a 81 y.o. female presenting with fall and shoulder pain. The history is provided by the patient. No language interpreter was used.  Fall This is a new problem. The current episode started 1 to 2 hours ago. The problem occurs rarely.  Shoulder Pain Location:  Shoulder and elbow Time since incident:  2 hours Injury: yes   Shoulder location:  R shoulder Elbow location:  R elbow Pain details:    Severity:  Severe   Onset quality:  Sudden   Duration:  2 hours   Timing:  Constant   Progression:  Unchanged Chronicity:  New Foreign body present:  No foreign bodies Relieved by:  Being still Worsened by:  Movement Ineffective treatments:  None tried   HPI Comments: [name] is a 81 y.o. female who presents to the Emergency Department complaining of severe, constant, sudden onset right shoulder and right elbow pain S/P a fall that occurred PTA. Patient was at the park walking and stepped in a hole and fell. Movement exacerbates the pain. Patient sees Dr. Amedeo Plenty at [facility].  Past Medical History  Diagnosis Date  . Autoimmune disease   . Dysphagia   . Hyperlipidemia   . Breast cancer 2010    cT1 N0 M0; ER+/ PR+/ HER2 neg; s/p lumpectomy 07/2010; started adjuvant hormonal therapy 10/2009  . Barrett's esophagus   . Osteopenia   . Pneumonia   . Cervical cancer   . Neuromuscular disorder   . Hypertension   . Glaucoma   . Bone lesion     sacrum  . Osteoporosis   . Osteoporosis 03/18/2012  . Humerus fracture 03/2012    impacted left humueral neck fracture, treated by Dr. Amedeo Plenty  . Fx distal radius NEC-closed   . Fracture of left shoulder    Past Surgical History  Procedure Laterality  Date  . Hand surgery    . Ovarian cyst removal  1978  . Appendectomy    . Breast lumpectomy    . Ortho procedures      multiple  . Abdominal hysterectomy  1972    cancer          1 tube 1 ovary  . Shoulder surgery  1954    left  . Colon polyp removal     Family History  Problem Relation Age of Onset  . Heart disease Father   . Cancer Sister     lung, kidney  . Cancer Brother     brain  . Cancer Paternal Aunt     breast  . Cancer Brother     liver, lung   History  Substance Use Topics  . Smoking status: Former Smoker    Quit date: 12/16/1986  . Smokeless tobacco: Never Used     Comment: FORMER SMOKER 30 YEARS AGO  . Alcohol Use: Yes     Comment: OCCASIONALLY   OB History    No data available     Review of Systems  Musculoskeletal: Positive for myalgias and arthralgias.  All other systems reviewed and are negative.   Allergies  Daypro  Home Medications   Prior to Admission medications   Medication  Sig Start Date End Date Taking? Authorizing Provider  Calcium Carbonate-Vitamin D (CALCIUM + D PO) Take 1 capsule by mouth 3 (three) times daily.     Historical Provider, MD  latanoprost (XALATAN) 0.005 % ophthalmic solution Place 1 drop into both eyes at bedtime.      Historical Provider, MD  meclizine (ANTIVERT) 25 MG tablet Take 1 tablet (25 mg total) by mouth 3 (three) times daily as needed for dizziness or nausea. 08/24/14   Dorie Rank, MD  rosuvastatin (CRESTOR) 10 MG tablet Take 1 tablet (10 mg total) by mouth daily. 05/30/14 05/30/15  Darlyne Russian, MD   BP 139/93 mmHg  Pulse 66  Temp(Src) 98.5 F (36.9 C) (Oral)  Resp 20  Ht 5' 3.5" (1.613 m)  Wt 106 lb (48.081 kg)  BMI 18.48 kg/m2  SpO2 99% Physical Exam  HENT:  Mouth/Throat: Oropharynx is clear and moist. No oropharyngeal exudate.  Eyes: EOM are normal. Pupils are equal, round, and reactive to light.  Cardiovascular: Normal rate, regular rhythm and normal heart sounds.   Pulmonary/Chest: Effort  normal and breath sounds normal. No respiratory distress. She has no wheezes. She has no rales.  Abdominal: Soft. Bowel sounds are normal. There is no tenderness. There is no rebound and no guarding.  Musculoskeletal: She exhibits tenderness.  RUE: There is swelling and TTP over the proximal humerus. Distal ulnar and radial pulses are palpable. Sensation is intact of the entire hand. She is able to flex, extend, and oppose all fingers.  Neurological: She has normal reflexes. No cranial nerve deficit.  Nursing note and vitals reviewed.   ED Course  Procedures (including critical care time)  DIAGNOSTIC STUDIES: Oxygen Saturation is 99% on room air, normal by my interpretation.    COORDINATION OF CARE: 6:11 PM - Discussed treatment plan with pt's parent at bedside which includes sling and swath, Rx pain medication, and ortho f/u and pt's parent agreed to plan.    Labs Review Labs Reviewed - No data to display  Imaging Review Dg Shoulder Right  02/05/2015   CLINICAL DATA:  Stepped in hole, post fall, now with right shoulder pain. Initial encounter.  EXAM: RIGHT SHOULDER - 2+ VIEW  COMPARISON:  Right humerus radiographs-earlier same day  FINDINGS: There is a comminuted, slightly displaced and minimally impacted fracture involving the surgical neck of the right humerus. Additionally, there is a vertically aligned minimally displaced fracture involving the greater tuberosity of the right humerus. No definite intra-articular extension. Limited visualization of the acromioclavicular and glenohumeral joint spaces appear preserved given obliquity. Expected adjacent soft tissue swelling. No radiopaque foreign body. Limited visualization of the adjacent thorax is normal.  IMPRESSION: 1. Comminuted, slightly displaced and minimally impacted fracture of the surgical neck of the right humerus. 2. Minimally displaced vertically aligned fracture of the greater tuberosity of the right humerus.   Electronically  Signed   By: Sandi Mariscal M.D.   On: 02/05/2015 18:04   Dg Elbow Complete Right  02/05/2015   CLINICAL DATA:  Stepped in a hole and fell today with right elbow pain.  EXAM: RIGHT ELBOW - COMPLETE 3+ VIEW  COMPARISON:  None.  FINDINGS: Examination demonstrates a possible tiny chip fracture along the posterior medial aspect of the proximal ulna seen only on the oblique view. No distal humeral or radial head fracture. No displaced fat pads. No dislocation.  IMPRESSION: Possible subtle chip fracture along the posterior medial aspect of the proximal ulna.   Electronically Signed  By: Marin Olp M.D.   On: 02/05/2015 17:57   Dg Humerus Right  02/05/2015   CLINICAL DATA:  Stepped in a hole and fell today with right humeral pain.  EXAM: RIGHT HUMERUS - 2+ VIEW  COMPARISON:  None.  FINDINGS: Examination demonstrates a nondisplaced fracture of the humeral head and neck with minimal displacement and comminution of the greater tuberosity. Minimal degenerative change of the [facility] joint and glenohumeral joints.  IMPRESSION: Minimally displaced slightly comminuted fracture of the humeral head/ neck with involvement of the greater tuberosity.   Electronically Signed   By: Marin Olp M.D.   On: 02/05/2015 17:59     EKG Interpretation None      MDM   Final diagnoses:  None   Xrays show a proximal humerus fracture.  Will place in a sling and swathe, followup with orthopedics in the next week.  I personally performed the services described in this documentation, which was scribed in my presence. The recorded information has been reviewed and is accurate.    Veryl Speak, MD 02/06/15 1929  Veryl Speak, MD 02/06/15 [id number]

## 2015-03-01 ENCOUNTER — Other Ambulatory Visit: Payer: Self-pay | Admitting: Emergency Medicine

## 2015-03-01 ENCOUNTER — Ambulatory Visit (INDEPENDENT_AMBULATORY_CARE_PROVIDER_SITE_OTHER): Payer: Medicare Other | Admitting: Emergency Medicine

## 2015-03-01 VITALS — BP 120/80 | HR 53 | Temp 97.4°F | Resp 16 | Ht 62.0 in | Wt 106.8 lb

## 2015-03-01 DIAGNOSIS — E785 Hyperlipidemia, unspecified: Secondary | ICD-10-CM

## 2015-03-01 DIAGNOSIS — M810 Age-related osteoporosis without current pathological fracture: Secondary | ICD-10-CM

## 2015-03-01 DIAGNOSIS — N289 Disorder of kidney and ureter, unspecified: Secondary | ICD-10-CM

## 2015-03-01 DIAGNOSIS — D509 Iron deficiency anemia, unspecified: Secondary | ICD-10-CM

## 2015-03-01 LAB — CBC WITH DIFFERENTIAL/PLATELET
Basophils Absolute: 0 10*3/uL (ref 0.0–0.1)
Basophils Relative: 0 % (ref 0–1)
Eosinophils Absolute: 0.1 10*3/uL (ref 0.0–0.7)
Eosinophils Relative: 2 % (ref 0–5)
HCT: 32.9 % — ABNORMAL LOW (ref 36.0–46.0)
Hemoglobin: 10.9 g/dL — ABNORMAL LOW (ref 12.0–15.0)
Lymphocytes Relative: 17 % (ref 12–46)
Lymphs Abs: 1.2 10*3/uL (ref 0.7–4.0)
MCH: 28.4 pg (ref 26.0–34.0)
MCHC: 33.1 g/dL (ref 30.0–36.0)
MCV: 85.7 fL (ref 78.0–100.0)
MPV: 11.1 fL (ref 8.6–12.4)
Monocytes Absolute: 0.5 10*3/uL (ref 0.1–1.0)
Monocytes Relative: 7 % (ref 3–12)
Neutro Abs: 5.3 10*3/uL (ref 1.7–7.7)
Neutrophils Relative %: 74 % (ref 43–77)
Platelets: 322 10*3/uL (ref 150–400)
RBC: 3.84 MIL/uL — ABNORMAL LOW (ref 3.87–5.11)
RDW: 15.8 % — ABNORMAL HIGH (ref 11.5–15.5)
WBC: 7.2 10*3/uL (ref 4.0–10.5)

## 2015-03-01 LAB — BASIC METABOLIC PANEL WITH GFR
BUN: 19 mg/dL (ref 6–23)
CO2: 27 mEq/L (ref 19–32)
Calcium: 9.3 mg/dL (ref 8.4–10.5)
Chloride: 103 mEq/L (ref 96–112)
Creat: 0.99 mg/dL (ref 0.50–1.10)
GFR, Est African American: 62 mL/min
GFR, Est Non African American: 54 mL/min — ABNORMAL LOW
Glucose, Bld: 74 mg/dL (ref 70–99)
Potassium: 4.7 mEq/L (ref 3.5–5.3)
Sodium: 139 mEq/L (ref 135–145)

## 2015-03-01 LAB — LIPID PANEL
Cholesterol: 166 mg/dL (ref 0–200)
HDL: 82 mg/dL (ref >=46)
LDL Cholesterol: 71 mg/dL (ref 0–99)
Total CHOL/HDL Ratio: 2 Ratio
Triglycerides: 63 mg/dL (ref <150)
VLDL: 13 mg/dL (ref 0–40)

## 2015-03-01 NOTE — Progress Notes (Addendum)
   Subjective:  This chart was scribed for Lori Jordan, MD by [facility], medical scribe at [facility].The patient was seen in exam room 21 and the patient's care was started at 8:58 AM.   Patient ID: [name], female    DOB: 12/15/1930, 81 y.o.   MRN: [id number] Chief Complaint  Patient presents with  . Follow-up    6 mos  . Hyperlipidemia  . Medication Refill    Crestor 10 mg 90 days   HPI HPI Comments: [name] is a 81 y.o. female who presents to [facility] for a 6 month follow up. Pt has a history of breast cancer and is currently undergoing chemotherapy.   Fall: Pt fell at the park while walking about three weeks ago. He is being seen by Dr. Amedeo Plenty. She is not on treatment for osteoporosis due to breast cancer. She has broken both wrists and both shoulders. She is taking Calcium 500 mg and Vitamin D 1300 mg daily. Pt typically walks 7 miles a day but has not in the past three week  Hyperlipidemia: Pt here for a 6 month follow up. She is currently taking Crestor 10 mg daily. She needs her Crestor refilled. She denies chest pain, and abdominal pain.  Review of Systems  Cardiovascular: Negative for chest pain and leg swelling.  Gastrointestinal: Negative for abdominal pain.  Musculoskeletal: Positive for arthralgias.      Objective:  BP 120/80 mmHg  Pulse 53  Temp(Src) 97.4 F (36.3 C) (Oral)  Resp 16  Ht '5\' 2"'$  (1.575 m)  Wt 106 lb 12.8 oz (48.444 kg)  BMI 19.53 kg/m2  SpO2 100% Physical Exam  Nursing note and vitals reviewed. CONSTITUTIONAL: Well developed/well nourished, right arm is in a sling HEAD: Normocephalic/atraumatic EYES: EOMI/PERRL ENMT: Mucous membranes moist NECK: supple no meningeal signs SPINE/BACK:entire spine nontender CV: S1/S2 noted, no murmurs/rubs/gallops noted LUNGS: Lungs are clear to auscultation bilaterally, no apparent distress ABDOMEN: soft, nontender, no rebound or guarding, bowel sounds  noted throughout abdomen GU:no cva tenderness NEURO: Pt is awake/alert/appropriate, moves all extremitiesx4.  No facial droop.   EXTREMITIES: pulses normal/equal, full ROM SKIN: warm, color normal PSYCH: no abnormalities of mood noted, alert and oriented to situation  Lab Results  Component Value Date   CHOL 162 08/31/2014   HDL 72 08/31/2014   LDLCALC 78 08/31/2014   TRIG 58 08/31/2014   CHOLHDL 2.3 08/31/2014         Assessment & Plan:  1. Hyperlipidemia  - Lipid panel  2. Osteoporosis  - Vit D  25 hydroxy (rtn osteoporosis monitoring) - CBC with Differential/Platelet - Ambulatory referral to Endocrinology  3. Decreased renal function  - BASIC METABOLIC PANEL WITH GFR   I personally performed the services described in this documentation, which was scribed in my presence. The recorded information has been reviewed and is accurate.  [name], MD  [facility]  03/01/2015 9:11 AM

## 2015-03-02 LAB — IRON AND TIBC
%SAT: 18 % — ABNORMAL LOW (ref 20–55)
Iron: 46 ug/dL (ref 42–145)
TIBC: 253 ug/dL (ref 250–470)
UIBC: 207 ug/dL (ref 125–400)

## 2015-03-02 LAB — VITAMIN D 25 HYDROXY (VIT D DEFICIENCY, FRACTURES): Vit D, 25-Hydroxy: 67 ng/mL (ref 30–100)

## 2015-03-02 LAB — FERRITIN: Ferritin: 156 ng/mL (ref 10–291)

## 2015-03-15 ENCOUNTER — Ambulatory Visit (INDEPENDENT_AMBULATORY_CARE_PROVIDER_SITE_OTHER): Payer: Medicare Other | Admitting: Endocrinology

## 2015-03-15 ENCOUNTER — Encounter: Payer: Self-pay | Admitting: Endocrinology

## 2015-03-15 VITALS — BP 136/86 | HR 68 | Temp 97.8°F | Ht 62.0 in | Wt 105.0 lb

## 2015-03-15 DIAGNOSIS — M810 Age-related osteoporosis without current pathological fracture: Secondary | ICD-10-CM

## 2015-03-15 LAB — TSH: TSH: 5.07 u[IU]/mL — ABNORMAL HIGH (ref 0.35–4.50)

## 2015-03-15 NOTE — Patient Instructions (Addendum)
blood tests are requested for you today.  We'll let you know about the results. it is critically important to prevent falling down (keep floor areas well-lit, dry, and free of loose objects.  If you have a cane, walker, or wheelchair, you should use it, even for short trips around the house.  Also, try not to rush).  Based on the results, i hope to request for you a daily injection called "forteo."  Please see Leonia Reader, to learn how to inject. Please return in 1 year.

## 2015-03-15 NOTE — Progress Notes (Signed)
Subjective:    Patient ID: [name], female    DOB: [birth year], 81 y.o.   MRN: [id number]  HPI Pt was noted to have osteoporosis in approx 2004.  She took fosamax approx 2002-2011.  She had spinal comp fx in the 1990's, fx left shoulder in 2013, left wrist in 2012, right wrist in 2014, and right elbow 5 weeks ago.  All of these were with falls.  She had TAH (but not BSO) at age 49.  She has no history of any of the following: multiple myeloma, renal dz, thyroid problems, prolonged bedrest, steriods, alcoholism, smoking, liver dz, vid-d deficiency, primary hyperparathyroidism, heparin, or anticonvulsants.  She took anastrozole approx 2008-2013.  She smoked approx 1955-1975. She has slight pain at the right elbow, but no assoc numbness.  She did not tolerate prolia (pneumonia, yeast infection, and folliculitis).  Past Medical History  Diagnosis Date  . Autoimmune disease   . Dysphagia   . Hyperlipidemia   . Breast cancer 2010    cT1 N0 M0; ER+/ PR+/ HER2 neg; s/p lumpectomy 07/2010; started adjuvant hormonal therapy 10/2009  . Barrett's esophagus   . Osteopenia   . Pneumonia   . Cervical cancer   . Neuromuscular disorder   . Hypertension   . Glaucoma   . Bone lesion     sacrum  . Osteoporosis   . Osteoporosis 03/18/2012  . Humerus fracture 03/2012    impacted left humueral neck fracture, treated by Dr. Amedeo Plenty  . Fx distal radius NEC-closed   . Fracture of left shoulder     Past Surgical History  Procedure Laterality Date  . Hand surgery    . Ovarian cyst removal  1978  . Appendectomy    . Breast lumpectomy    . Ortho procedures      multiple  . Abdominal hysterectomy  1972    cancer          1 tube 1 ovary  . Shoulder surgery  1954    left  . Colon polyp removal      History   Social History  . Marital Status: Married    Spouse Name: N/A  . Number of Children: N/A  . Years of Education: N/A   Occupational History  . Not on file.   Social History Main Topics    . Smoking status: Former Smoker    Quit date: 12/16/1986  . Smokeless tobacco: Never Used     Comment: FORMER SMOKER 30 YEARS AGO  . Alcohol Use: Yes     Comment: OCCASIONALLY  . Drug Use: Not on file  . Sexual Activity: Not on file   Other Topics Concern  . Not on file   Social History Narrative    Current Outpatient Prescriptions on File Prior to Visit  Medication Sig Dispense Refill  . Calcium Carbonate-Vitamin D (CALCIUM + D PO) Take 1 capsule by mouth 3 (three) times daily.     Marland Kitchen HYDROcodone-acetaminophen (NORCO) 5-325 MG per tablet Take 1-2 tablets by mouth every 6 (six) hours as needed. 25 tablet 0  . Travoprost (TRAVATAN OP) Apply to eye.    . rosuvastatin (CRESTOR) 10 MG tablet Take 1 tablet (10 mg total) by mouth daily. (Patient not taking: Reported on 03/15/2015) 90 tablet 3   No current facility-administered medications on file prior to visit.    Allergies  Allergen Reactions  . Daypro [Oxaprozin] Swelling    Face and tongue    Family History  Problem  Relation Age of Onset  . Heart disease Father   . Cancer Sister     lung, kidney  . Cancer Brother     brain  . Cancer Paternal Aunt     breast  . Cancer Brother     liver, lung  . Osteoporosis Neg Hx     BP 136/86 mmHg  Pulse 68  Temp(Src) 97.8 F (36.6 C) (Oral)  Ht $R'5\' 2"'id$  (1.575 m)  Wt 105 lb (47.628 kg)  BMI 19.20 kg/m2  SpO2 98%    Review of Systems denies weight loss, galactorrhea, hematuria, memory loss, tremor, arthralgias, abdominal pain, urinary frequency, skin rash, visual loss, sob, diarrhea, recent falls, and easy bruising.  She reports cold intolerance and rhinorrhea.     Objective:   Physical Exam VS: see vs page GEN: no distress HEAD: head: no deformity eyes: no periorbital swelling, no proptosis external nose and ears are normal mouth: no lesion seen NECK: supple, thyroid is not enlarged CHEST WALL: no deformity; no kyphosis.  LUNGS:  Clear to auscultation CV: reg rate and  rhythm, no murmur.  ABD: abdomen is soft, nontender.  no hepatosplenomegaly.  not distended.  no hernia MUSCULOSKELETAL: muscle bulk and strength are grossly normal.  no obvious joint swelling.  gait is normal and steady EXTEMITIES: no deformity.  no ulcer on the feet.  feet are of normal color and temp.  no edema.  (RUE exam is declined, due to pain) PULSES: dorsalis pedis intact bilat.  no carotid bruit NEURO:  cn 2-12 grossly intact.   readily moves all 4's.  sensation is intact to touch on the feet SKIN:  Normal texture and temperature.  No rash or suspicious lesion is visible.   NODES:  None palpable at the neck PSYCH: alert, well-oriented.  Does not appear anxious nor depressed.   Lab Results  Component Value Date   CALCIUM 9.3 03/01/2015   Radiol: i reviewed DEXA report (11/11/13): worst T-score is -2.7  i reviewed outside records: ov from 03/01/15: pt was ref here after recurrent falls.     Assessment & Plan:  Osteoporosis, new: therapy is limited by multiple perceived drug intolerances   Patient is advised the following: Patient Instructions  blood tests are requested for you today.  We'll let you know about the results. it is critically important to prevent falling down (keep floor areas well-lit, dry, and free of loose objects.  If you have a cane, walker, or wheelchair, you should use it, even for short trips around the house.  Also, try not to rush).  Based on the results, i hope to request for you a daily injection called "forteo."  Please see Leonia Reader, to learn how to inject. Please return in 1 year.

## 2015-03-16 LAB — PTH, INTACT AND CALCIUM
Calcium: 10.3 mg/dL (ref 8.4–10.5)
PTH: 25 pg/mL (ref 14–64)

## 2015-03-18 LAB — VITAMIN D 1,25 DIHYDROXY
Vitamin D 1, 25 (OH)2 Total: 39 pg/mL (ref 18–72)
Vitamin D2 1, 25 (OH)2: <8 pg/mL
Vitamin D3 1, 25 (OH)2: 39 pg/mL

## 2015-03-19 LAB — PROTEIN ELECTROPHORESIS, SERUM
Albumin ELP: 3.9 g/dL (ref 3.8–4.8)
Alpha-1-Globulin: 0.5 g/dL — ABNORMAL HIGH (ref 0.2–0.3)
Alpha-2-Globulin: 1.2 g/dL — ABNORMAL HIGH (ref 0.5–0.9)
Beta 2: 0.4 g/dL (ref 0.2–0.5)
Beta Globulin: 0.5 g/dL (ref 0.4–0.6)
Gamma Globulin: 1 g/dL (ref 0.8–1.7)
Total Protein, Serum Electrophoresis: 7.3 g/dL (ref 6.1–8.1)

## 2015-03-20 ENCOUNTER — Encounter: Payer: Medicare Other | Attending: Endocrinology | Admitting: Nutrition

## 2015-03-20 DIAGNOSIS — M810 Age-related osteoporosis without current pathological fracture: Secondary | ICD-10-CM

## 2015-03-20 NOTE — Progress Notes (Deleted)
Subjective:     Patient ID: [name], female   DOB: Feb 01, 1931, 81 y.o.   MRN: [id number]  HPI   Review of Systems       Objective:   Physical Exam     Assessment:       Plan:

## 2015-03-21 ENCOUNTER — Telehealth: Payer: Self-pay | Admitting: Endocrinology

## 2015-03-21 NOTE — Telephone Encounter (Signed)
And send labs too please

## 2015-03-21 NOTE — Telephone Encounter (Signed)
Requested forms faxed to [employer].

## 2015-03-21 NOTE — Telephone Encounter (Signed)
Fax [phone]  Needs notes to support rx for the forteo

## 2015-03-26 ENCOUNTER — Telehealth: Payer: Self-pay | Admitting: Endocrinology

## 2015-03-26 NOTE — Telephone Encounter (Signed)
Merna # [phone]  Calling regarding med request for [place] as been approved for one year as of March 26, 2015,  Letter is on the way

## 2015-03-26 NOTE — Telephone Encounter (Signed)
See note below to be advised. 

## 2015-03-27 ENCOUNTER — Telehealth: Payer: Self-pay | Admitting: Endocrinology

## 2015-03-27 NOTE — Addendum Note (Signed)
Addended by: Constance Goltz on: 03/27/2015 12:03 PM   Modules accepted: Miquel Dunn

## 2015-03-27 NOTE — Telephone Encounter (Signed)
I contacted the pt's daughter and advised Dr. Loanne Drilling is currently out of the office and our covering MD is out of the office as well. I advised if the swallowing issue persists the pt needs to go to the ED. Daughter voiced understanding. I will discus with Dr. Dwyane Dee in the morning.

## 2015-03-27 NOTE — Telephone Encounter (Signed)
Daughter calling the pt  now is losing her voice, having trouble swallowing and right breast is swollen.

## 2015-03-28 NOTE — Telephone Encounter (Signed)
This is not a side effect from [facility], needs to discuss with her PCP

## 2015-03-28 NOTE — Telephone Encounter (Signed)
Pt's daughter advised of note below and voiced understanding.

## 2015-03-28 NOTE — Telephone Encounter (Signed)
Since starting the forteo , pt states that she has been having trouble swallowing, her right breast is swollen and her voice is becoming raspy. Pt is concerned these symptoms could be a possible side effect from the medication.  Could you review notes and please advise during Dr. Cordelia Pen absence. Thanks!

## 2015-04-02 ENCOUNTER — Telehealth: Payer: Self-pay | Admitting: Nutrition

## 2015-04-02 NOTE — Telephone Encounter (Signed)
Please discuss with Dr. Loanne Drilling next week

## 2015-04-02 NOTE — Telephone Encounter (Signed)
Patient reported that after her second injection of Forteo, she began to have difficulty swallowing and speaking, and her left breast swelled, and it hurt under her left arm.  She then stopped this medication, and has not taken it since last week.  I told her to not restart this at this time.

## 2015-04-03 ENCOUNTER — Ambulatory Visit (INDEPENDENT_AMBULATORY_CARE_PROVIDER_SITE_OTHER): Payer: Medicare Other | Admitting: Emergency Medicine

## 2015-04-03 ENCOUNTER — Encounter: Payer: Self-pay | Admitting: Emergency Medicine

## 2015-04-03 VITALS — BP 110/69 | HR 72 | Temp 98.3°F | Resp 16 | Ht 62.0 in | Wt 106.2 lb

## 2015-04-03 DIAGNOSIS — N63 Unspecified lump in unspecified breast: Secondary | ICD-10-CM

## 2015-04-03 DIAGNOSIS — D509 Iron deficiency anemia, unspecified: Secondary | ICD-10-CM | POA: Diagnosis not present

## 2015-04-03 LAB — IRON AND TIBC
%SAT: 24 % (ref 20–55)
Iron: 63 ug/dL (ref 42–145)
TIBC: 261 ug/dL (ref 250–470)
UIBC: 198 ug/dL (ref 125–400)

## 2015-04-03 LAB — FERRITIN: Ferritin: 120 ng/mL (ref 10–291)

## 2015-04-03 NOTE — Addendum Note (Signed)
Addended by: [name] A on: 04/03/2015 12:58 PM   Modules accepted: Level of Service

## 2015-04-03 NOTE — Progress Notes (Addendum)
Subjective:    Patient ID: [name], female    DOB: [birth year], 81 y.o.   MRN: [id number] This chart was scribed for [name], MD by Zola Button, Medical Scribe. This patient was seen in room 21 and the patient's care was started at 11:40 AM.   HPI HPI Comments: [name] is a 81 y.o. female with a hx of osteoporosis, hyperlipidemia, and breast cancer who presents to the [facility] for a follow-up for anemia. Her hemoglobin was 10.9 1 month ago.  Right Elbow Fracture: Patient did fracture her right elbow after a fall about 2 months ago. She has improved significantly since then and is able to move her right arm.  Osteoporosis: Patient did try the Forteo injections for about a week, but she discontinued it due to side effects, including general malaise, right breast swelling and some issues with her throat. She still has some swelling to her right breast currently.  Breast Cancer: She was diagnosed with breast cancer about 5 years ago. Patient had a mammogram last October and is set to have another one this October. She is no longer followed by her oncologist, but she does see Dr. Marlou Starks once a year.  Hyperlipidemia: Patient has been doing fine with the Crestor.  Review of Systems  Genitourinary:       Right breast swelling       Objective:   Physical Exam CONSTITUTIONAL: Well developed/well nourished HEAD: Normocephalic/atraumatic EYES: EOM/PERRL ENMT: Mucous membranes moist NECK: supple no meningeal signs SPINE: entire spine nontender CV: S1/S2 noted, no murmurs/rubs/gallops noted LUNGS: Lungs are clear to auscultation bilaterally, no apparent distress ABDOMEN: soft, nontender, no rebound or guarding GU: Mild deformity of the left breast. Right breast is normal. NEURO: Pt is awake/alert, moves all extremitiesx4 EXTREMITIES: pulses normal, full ROM SKIN: warm, color normal PSYCH: no abnormalities of mood noted Results for orders placed or  performed in visit on 03/15/15  Vitamin D 1,25 dihydroxy  Result Value Ref Range   Vitamin D 1, 25 (OH)2 Total 39 18 - 72 pg/mL   Vitamin D3 1, 25 (OH)2 39 pg/mL   Vitamin D2 1, 25 (OH)2 <8 pg/mL  Protein electrophoresis, serum  Result Value Ref Range   Total Protein, Serum Electrophoresis 7.3 6.1 - 8.1 g/dL   Albumin ELP 3.9 3.8 - 4.8 g/dL   Alpha-1-Globulin 0.5 (H) 0.2 - 0.3 g/dL   Alpha-2-Globulin 1.2 (H) 0.5 - 0.9 g/dL   Beta Globulin 0.5 0.4 - 0.6 g/dL   Beta 2 0.4 0.2 - 0.5 g/dL   Gamma Globulin 1.0 0.8 - 1.7 g/dL   Abnormal Protein Band1 NOT DET g/dL   SPE Interp. SEE NOTE    COMMENT (PROTEIN ELECTROPHOR) SEE NOTE    Abnormal Protein Band2 NOT DET g/dL   Abnormal Protein Band3 NOT DET g/dL  PTH, intact and calcium  Result Value Ref Range   PTH 25 14 - 64 pg/mL   Calcium 10.3 8.4 - 10.5 mg/dL  TSH  Result Value Ref Range   TSH 5.07 (H) 0.35 - 4.50 uIU/mL       Assessment & Plan:  Will schedule mammogram of the right breast because of her swelling. I did not appreciate significant swelling on exam. She does have a history of breast cancer. Hemoglobin was repeated. She is scheduled for repeat GI evaluation.I personally performed the services described in this documentation, which was scribed in my presence. The recorded information has been reviewed and  is accurate.  Nena Jordan, MD

## 2015-04-04 ENCOUNTER — Other Ambulatory Visit: Payer: Self-pay | Admitting: Emergency Medicine

## 2015-04-05 ENCOUNTER — Other Ambulatory Visit: Payer: Self-pay

## 2015-04-05 DIAGNOSIS — N63 Unspecified lump in unspecified breast: Secondary | ICD-10-CM

## 2015-04-09 ENCOUNTER — Other Ambulatory Visit (INDEPENDENT_AMBULATORY_CARE_PROVIDER_SITE_OTHER): Payer: Medicare Other | Admitting: *Deleted

## 2015-04-09 DIAGNOSIS — D509 Iron deficiency anemia, unspecified: Secondary | ICD-10-CM

## 2015-04-09 LAB — CBC WITH DIFFERENTIAL/PLATELET
Basophils Absolute: 0 10*3/uL (ref 0.0–0.1)
Basophils Relative: 0 % (ref 0–1)
Eosinophils Absolute: 0.1 10*3/uL (ref 0.0–0.7)
Eosinophils Relative: 2 % (ref 0–5)
HCT: 36.9 % (ref 36.0–46.0)
Hemoglobin: 12.1 g/dL (ref 12.0–15.0)
Lymphocytes Relative: 24 % (ref 12–46)
Lymphs Abs: 1.5 10*3/uL (ref 0.7–4.0)
MCH: 28.9 pg (ref 26.0–34.0)
MCHC: 32.8 g/dL (ref 30.0–36.0)
MCV: 88.1 fL (ref 78.0–100.0)
MPV: 11.1 fL (ref 8.6–12.4)
Monocytes Absolute: 0.6 10*3/uL (ref 0.1–1.0)
Monocytes Relative: 9 % (ref 3–12)
Neutro Abs: 4.1 10*3/uL (ref 1.7–7.7)
Neutrophils Relative %: 65 % (ref 43–77)
Platelets: 270 10*3/uL (ref 150–400)
RBC: 4.19 MIL/uL (ref 3.87–5.11)
RDW: 16 % — ABNORMAL HIGH (ref 11.5–15.5)
WBC: 6.3 10*3/uL (ref 4.0–10.5)

## 2015-04-10 NOTE — Telephone Encounter (Signed)
See Dr. Cordelia Pen note.

## 2015-04-10 NOTE — Telephone Encounter (Signed)
Yes, please. Thank you.

## 2015-04-10 NOTE — Progress Notes (Signed)
Patient was shown how to inject the forteo.  Stressed the need to do this once a day, and to keep the medication refrigerated.  She re demonstrated on a demo pen, how to attach a needle and set the dose, and pointed on her abdomen the appropriate sites to inject.  We reviewed the black box warning, and she had no questions.  She reported good understanding of all topics and had no final questions.

## 2015-04-10 NOTE — Telephone Encounter (Signed)
The adverse event was called into [facility] at [phone]

## 2015-04-11 ENCOUNTER — Ambulatory Visit
Admission: RE | Admit: 2015-04-11 | Discharge: 2015-04-11 | Disposition: A | Discharge status: Home / Self Care | Payer: Medicare Other | Source: Ambulatory Visit | Attending: Emergency Medicine | Admitting: Emergency Medicine

## 2015-04-11 DIAGNOSIS — N63 Unspecified lump in unspecified breast: Secondary | ICD-10-CM

## 2015-06-05 ENCOUNTER — Other Ambulatory Visit: Payer: Self-pay | Admitting: Emergency Medicine

## 2015-07-09 ENCOUNTER — Other Ambulatory Visit: Payer: Self-pay | Admitting: Emergency Medicine

## 2015-07-09 DIAGNOSIS — Z853 Personal history of malignant neoplasm of breast: Secondary | ICD-10-CM

## 2015-07-09 DIAGNOSIS — Z9889 Other specified postprocedural states: Secondary | ICD-10-CM

## 2015-07-16 ENCOUNTER — Ambulatory Visit (INDEPENDENT_AMBULATORY_CARE_PROVIDER_SITE_OTHER): Payer: Medicare Other | Admitting: *Deleted

## 2015-07-16 DIAGNOSIS — Z23 Encounter for immunization: Secondary | ICD-10-CM | POA: Diagnosis not present

## 2015-07-17 ENCOUNTER — Encounter: Payer: Self-pay | Admitting: Emergency Medicine

## 2015-08-09 ENCOUNTER — Ambulatory Visit
Admission: RE | Admit: 2015-08-09 | Discharge: 2015-08-09 | Disposition: A | Discharge status: Home / Self Care | Payer: Medicare Other | Source: Ambulatory Visit | Attending: Emergency Medicine | Admitting: Emergency Medicine

## 2015-08-09 DIAGNOSIS — Z853 Personal history of malignant neoplasm of breast: Secondary | ICD-10-CM

## 2015-08-09 DIAGNOSIS — Z9889 Other specified postprocedural states: Secondary | ICD-10-CM

## 2015-08-30 ENCOUNTER — Encounter: Payer: Self-pay | Admitting: Emergency Medicine

## 2015-08-30 ENCOUNTER — Ambulatory Visit (INDEPENDENT_AMBULATORY_CARE_PROVIDER_SITE_OTHER): Payer: Medicare Other | Admitting: Emergency Medicine

## 2015-08-30 VITALS — BP 149/84 | HR 53 | Temp 97.5°F | Resp 16 | Ht 62.0 in | Wt 108.0 lb

## 2015-08-30 DIAGNOSIS — E785 Hyperlipidemia, unspecified: Secondary | ICD-10-CM

## 2015-08-30 LAB — CBC WITH DIFFERENTIAL/PLATELET
Basophils Absolute: 0 10*3/uL (ref 0.0–0.1)
Basophils Relative: 0 % (ref 0–1)
Eosinophils Absolute: 0.2 10*3/uL (ref 0.0–0.7)
Eosinophils Relative: 3 % (ref 0–5)
HCT: 39.2 % (ref 36.0–46.0)
Hemoglobin: 12.8 g/dL (ref 12.0–15.0)
Lymphocytes Relative: 23 % (ref 12–46)
Lymphs Abs: 1.3 10*3/uL (ref 0.7–4.0)
MCH: 29.1 pg (ref 26.0–34.0)
MCHC: 32.7 g/dL (ref 30.0–36.0)
MCV: 89.1 fL (ref 78.0–100.0)
MPV: 11.4 fL (ref 8.6–12.4)
Monocytes Absolute: 0.4 10*3/uL (ref 0.1–1.0)
Monocytes Relative: 7 % (ref 3–12)
Neutro Abs: 3.9 10*3/uL (ref 1.7–7.7)
Neutrophils Relative %: 67 % (ref 43–77)
Platelets: 268 10*3/uL (ref 150–400)
RBC: 4.4 MIL/uL (ref 3.87–5.11)
RDW: 15.7 % — ABNORMAL HIGH (ref 11.5–15.5)
WBC: 5.8 10*3/uL (ref 4.0–10.5)

## 2015-08-30 LAB — LIPID PANEL
Cholesterol: 178 mg/dL (ref 125–200)
HDL: 96 mg/dL (ref >=46)
LDL Cholesterol: 69 mg/dL (ref <130)
Total CHOL/HDL Ratio: 1.9 Ratio (ref <=5.0)
Triglycerides: 63 mg/dL (ref <150)
VLDL: 13 mg/dL (ref <30)

## 2015-08-30 MED ORDER — ROSUVASTATIN CALCIUM 10 MG PO TABS: ORAL_TABLET | ORAL | Status: DC

## 2015-08-30 NOTE — Progress Notes (Addendum)
Patient ID: [name], female   DOB: 14-Nov-1930, 81 y.o.   MRN: [id number]     This chart was scribed for Lori Queen, MD by Zola Button, Medical Scribe. This patient was seen in room 21 and the patient's care was started at 8:14 AM.   Chief Complaint:  Chief Complaint  Patient presents with  . Follow-up  . Hyperlipidemia  . Medication Refill    HPI: [name] is a 81 y.o. female who reports to [facility] today for a follow-up. She has been eating and has continued to walk every day. She has not had to take the clonidine anymore. Patient recently had a Mohs surgery to remove skin cancer on the right side of her face by Dr. Sarajane Jews. She was discharged by her gynecologist.  She has had the shingles vaccine. Immunization History  Administered Date(s) Administered  . Influenza Split 06/22/2012  . Influenza,inj,Quad PF,36+ Mos 07/13/2013, 07/13/2014, 07/16/2015  . Pneumococcal Conjugate-13 05/30/2014  . Pneumococcal-Unspecified 05/13/2004    Patient will be celebrating Thanksgiving with her small family of 23.  Past Medical History  Diagnosis Date  . Autoimmune disease ([place])   . Dysphagia   . Hyperlipidemia   . Breast cancer ([place]) 2010    cT1 N0 M0; ER+/ PR+/ HER2 neg; s/p lumpectomy 07/2010; started adjuvant hormonal therapy 10/2009  . Barrett's esophagus   . Osteopenia   . Pneumonia   . Cervical cancer ([place])   . Neuromuscular disorder ([place])   . Hypertension   . Glaucoma   . Bone lesion     sacrum  . Osteoporosis   . Osteoporosis 03/18/2012  . Humerus fracture 03/2012    impacted left humueral neck fracture, treated by Dr. Amedeo Plenty  . Fx distal radius NEC-closed   . Fracture of left shoulder    Past Surgical History  Procedure Laterality Date  . Hand surgery    . Ovarian cyst removal  1978  . Appendectomy    . Breast lumpectomy    . Ortho procedures      multiple  . Abdominal hysterectomy  1972    cancer          1 tube 1 ovary  . Shoulder surgery  1954    left    . Colon polyp removal     Social History   Social History  . Marital Status: Married    Spouse Name: N/A  . Number of Children: N/A  . Years of Education: N/A   Social History Main Topics  . Smoking status: Former Smoker    Quit date: 12/16/1986  . Smokeless tobacco: Never Used     Comment: FORMER SMOKER 30 YEARS AGO  . Alcohol Use: Yes     Comment: OCCASIONALLY  . Drug Use: None  . Sexual Activity: Not Asked   Other Topics Concern  . None   Social History Narrative   Family History  Problem Relation Age of Onset  . Heart disease Father   . Cancer Sister     lung, kidney  . Cancer Brother     brain  . Cancer Paternal Aunt     breast  . Cancer Brother     liver, lung  . Osteoporosis Neg Hx    Allergies  Allergen Reactions  . Daypro [Oxaprozin] Swelling    Face and tongue   Prior to Admission medications   Medication Sig Start Date End Date Taking? Authorizing Delaney Perona  Calcium Carbonate-Vitamin D (CALCIUM + D PO) Take  1 capsule by mouth 3 (three) times daily. Taking 2 pills per day    Historical Bhavik Cabiness, MD  Multiple Vitamin (MULTIVITAMIN) capsule Take 1 capsule by mouth daily.    Historical Tellis Spivak, MD  rosuvastatin (CRESTOR) 10 MG tablet TAKE 1 BY MOUTH DAILY 06/05/15   Chelle Jeffery, PA-C  Travoprost (TRAVATAN OP) Apply to eye.    Historical Asanti Craigo, MD     ROS: The patient denies fevers, chills, night sweats, unintentional weight loss, chest pain, palpitations, wheezing, dyspnea on exertion, nausea, vomiting, abdominal pain, dysuria, hematuria, melena, numbness, weakness, or tingling.   All other systems have been reviewed and were otherwise negative with the exception of those mentioned in the HPI and as above.    PHYSICAL EXAM: Filed Vitals:   08/30/15 0810  BP: 153/82  Pulse: 59  Temp: 97.5 F (36.4 C)  Resp: 16   Body mass index is 19.75 kg/(m^2).   General: Alert, no acute distress HEENT:  Normocephalic, atraumatic, oropharynx  patent. [facility]: [facility] Cardiovascular:  Regular rate and rhythm, no rubs murmurs or gallops.  No Carotid bruits, radial pulse intact. No pedal edema.  Respiratory: Clear to auscultation bilaterally.  No wheezes, rales, or rhonchi.  No cyanosis, no use of accessory musculature Abdominal: No organomegaly, abdomen is soft and non-tender, positive bowel sounds.  No masses. Musculoskeletal: Gait intact. No edema, tenderness Skin: No rashes. Neurologic: Facial musculature symmetric. Psychiatric: Patient acts appropriately throughout our interaction. Lymphatic: No cervical or submandibular lymphadenopathy    LABS:    EKG/XRAY:   Primary read interpreted by Dr. Everlene Farrier at [facility].   ASSESSMENT/PLAN: Patient looks great. She has had no further episodes of esophageal spasm. She has no complaints at the present time. Lipid panel was done. Crestor was refilled. She is up-to-date on her immunizations. Recheck 6 months for her physical at that time. In the interim she has had removal of a skin cancer right side of the face.I personally performed the services described in this documentation, which was scribed in my presence. The recorded information has been reviewed and is accurate. Her blood pressure was up today she will monitor this at home. She had a difficult ride on the way to the office today and felt fairly stressed.  By signing my name below, I, Zola Button, attest that this documentation has been prepared under the direction and in the presence of Lori Queen, MD.  Electronically Signed: Zola Button, Medical Scribe. 08/30/2015. 8:25 AM.   Johney [place] sideeffects, risk and benefits, and alternatives of medications d/w patient. Patient is aware that all medications have potential sideeffects and we are unable to predict every sideeffect or drug-drug interaction that may occur. I personally performed the services described in this documentation, which was scribed in my presence. The recorded information  has been reviewed and is accurate. Lori Queen MD 08/30/2015 8:25 AM

## 2015-10-30 ENCOUNTER — Ambulatory Visit (INDEPENDENT_AMBULATORY_CARE_PROVIDER_SITE_OTHER): Payer: Medicare Other | Admitting: Family Medicine

## 2015-10-30 VITALS — BP 142/80 | HR 62 | Temp 97.5°F | Resp 16 | Ht 62.0 in | Wt 112.4 lb

## 2015-10-30 DIAGNOSIS — L02215 Cutaneous abscess of perineum: Secondary | ICD-10-CM

## 2015-10-30 MED ORDER — DOXYCYCLINE HYCLATE 100 MG PO CAPS: 100.0000 mg | ORAL_CAPSULE | Freq: Two times a day (BID) | ORAL | Status: DC

## 2015-10-30 NOTE — Patient Instructions (Signed)
We drained the small abscess on your perineum today Please soak in enough warm water in the bath to cover the area approx 3x a day for the next 2-3 days Use the doxycycline antibiotic as directed.   The area may ooze a little bit of blood. You can use a gauze pack or pad in your underwear.   If any significant bleeding or if not seeming to get better please let me know!

## 2015-10-30 NOTE — Progress Notes (Signed)
[facility] [address], [place] [address] [phone]  Date:  10/30/2015   Name:  [name]   DOB:  Jul 01, 1931   MRN:  [id number]  PCP:  Jenny Reichmann, MD    Chief Complaint: other   History of Present Illness:  [name] is a 82 y.o. very pleasant female patient who presents with the following:  Here today with concern of a sore, tender area on her behind. She is afraid it might be a boil.  It has been present for about 4 days.  It is getting worse.  She has not noted a fever or flu like sx, she is eating well and otherwise feels ok She did have something like this a few years ago- this was treated in [place] when she was away on vacation  Patient Active Problem List   Diagnosis Date Noted  . Esophageal spasm 09/20/2013  . Trigger finger 07/08/2012  . Osteoporosis 03/18/2012  . Fracture, shoulder 03/06/2012  . Hyperlipidemia   . Breast cancer [facility])     Past Medical History  Diagnosis Date  . Autoimmune disease ([place])   . Dysphagia   . Hyperlipidemia   . Breast cancer ([place]) 2010    cT1 N0 M0; ER+/ PR+/ HER2 neg; s/p lumpectomy 07/2010; started adjuvant hormonal therapy 10/2009  . Barrett's esophagus   . Osteopenia   . Pneumonia   . Cervical cancer ([place])   . Neuromuscular disorder ([place])   . Hypertension   . Glaucoma   . Bone lesion     sacrum  . Osteoporosis   . Osteoporosis 03/18/2012  . Humerus fracture 03/2012    impacted left humueral neck fracture, treated by Dr. Amedeo Plenty  . Fx distal radius NEC-closed   . Fracture of left shoulder     Past Surgical History  Procedure Laterality Date  . Hand surgery    . Ovarian cyst removal  1978  . Appendectomy    . Breast lumpectomy    . Ortho procedures      multiple  . Abdominal hysterectomy  1972    cancer          1 tube 1 ovary  . Shoulder surgery  1954    left  . Colon polyp removal      Social History  Substance Use Topics  . Smoking status: Former Smoker    Quit date:  12/16/1986  . Smokeless tobacco: Never Used     Comment: FORMER SMOKER 30 YEARS AGO  . Alcohol Use: Yes     Comment: OCCASIONALLY    Family History  Problem Relation Age of Onset  . Heart disease Father   . Cancer Sister     lung, kidney  . Cancer Brother     brain  . Cancer Paternal Aunt     breast  . Cancer Brother     liver, lung  . Osteoporosis Neg Hx     Allergies  Allergen Reactions  . Daypro [Oxaprozin] Swelling    Face and tongue    Medication list has been reviewed and updated.  Current Outpatient Prescriptions on File Prior to Visit  Medication Sig Dispense Refill  . Calcium Carbonate-Vitamin D (CALCIUM + D PO) Take 1 capsule by mouth 3 (three) times daily. Taking 2 pills per day    . Multiple Vitamin (MULTIVITAMIN) capsule Take 1 capsule by mouth daily.    . rosuvastatin (CRESTOR) 10 MG tablet TAKE 1 BY MOUTH  DAILY 90 tablet 3  . Travoprost (TRAVATAN OP) Apply to eye.     No current facility-administered medications on file prior to visit.    Review of Systems:  As per HPI- otherwise negative.   Physical Examination: Filed Vitals:   10/30/15 0828  BP: 142/80  Pulse: 62  Temp: 97.5 F (36.4 C)  Resp: 16   Filed Vitals:   10/30/15 0828  Height: _0  (1.575 m)  Weight: 112 lb 6.4 oz (50.984 kg)   Body mass index is 20.55 kg/(m^2). Ideal Body Weight: Weight in (lb) to have BMI = 25: 136.4  GEN: WDWN, NAD, Non-toxic, A & O x 3 HEENT: Atraumatic, Normocephalic. Neck supple. No masses, No LAD. Ears and Nose: No external deformity. CV: RRR, No M/G/R. No JVD. No thrill. No extra heart sounds. PULM: CTA B, no wheezes, crackles, rhonchi. No retractions. No resp. distress. No accessory muscle use. ABD: S, NT, ND, +BS. No rebound. No HSM. EXTR: No c/c/e NEURO Normal gait.  PSYCH: Normally interactive. Conversant. Not depressed or anxious appearing.  Calm demeanor.  There is a small abscess on the right sided perineum.  It is not draining on it's  own Rectal exam is normal, non- tender.  Abscess does not involve the anus or rectum  Procedure note: prepped with betadine.  Anesthesia with 70m of 1% lidocaine.  I and D with 11 blade and expressed small amount of hard pus or sebaceous material     Assessment and Plan: Abscess, perineum - Plan: doxycycline (VIBRAMYCIN) 100 MG capsule  S/p I and D as above Doxycycline by mouth for 10 days Sits baths Follow-up if not doing well See patient instructions for more details.     Signed JLamar Blinks MD

## 2015-12-04 DIAGNOSIS — H4389 Other disorders of vitreous body: Secondary | ICD-10-CM | POA: Diagnosis not present

## 2015-12-04 DIAGNOSIS — Z853 Personal history of malignant neoplasm of breast: Secondary | ICD-10-CM | POA: Diagnosis not present

## 2015-12-04 DIAGNOSIS — H43392 Other vitreous opacities, left eye: Secondary | ICD-10-CM | POA: Diagnosis not present

## 2015-12-04 DIAGNOSIS — Z85828 Personal history of other malignant neoplasm of skin: Secondary | ICD-10-CM | POA: Diagnosis not present

## 2015-12-04 DIAGNOSIS — M810 Age-related osteoporosis without current pathological fracture: Secondary | ICD-10-CM | POA: Diagnosis not present

## 2015-12-04 DIAGNOSIS — Z923 Personal history of irradiation: Secondary | ICD-10-CM | POA: Diagnosis not present

## 2015-12-04 DIAGNOSIS — H25813 Combined forms of age-related cataract, bilateral: Secondary | ICD-10-CM | POA: Diagnosis not present

## 2015-12-04 DIAGNOSIS — H409 Unspecified glaucoma: Secondary | ICD-10-CM | POA: Diagnosis not present

## 2015-12-04 DIAGNOSIS — Z8542 Personal history of malignant neoplasm of other parts of uterus: Secondary | ICD-10-CM | POA: Diagnosis not present

## 2015-12-04 DIAGNOSIS — E784 Other hyperlipidemia: Secondary | ICD-10-CM | POA: Diagnosis not present

## 2016-01-07 DIAGNOSIS — H35371 Puckering of macula, right eye: Secondary | ICD-10-CM | POA: Diagnosis not present

## 2016-02-28 ENCOUNTER — Encounter: Payer: Self-pay | Admitting: Emergency Medicine

## 2016-02-28 ENCOUNTER — Ambulatory Visit (INDEPENDENT_AMBULATORY_CARE_PROVIDER_SITE_OTHER): Payer: Medicare Other | Admitting: Emergency Medicine

## 2016-02-28 VITALS — BP 120/80 | HR 63 | Temp 97.7°F | Resp 16 | Ht 62.0 in | Wt 108.6 lb

## 2016-02-28 DIAGNOSIS — I7300 Raynaud's syndrome without gangrene: Secondary | ICD-10-CM | POA: Diagnosis not present

## 2016-02-28 DIAGNOSIS — M810 Age-related osteoporosis without current pathological fracture: Secondary | ICD-10-CM | POA: Diagnosis not present

## 2016-02-28 DIAGNOSIS — E785 Hyperlipidemia, unspecified: Secondary | ICD-10-CM

## 2016-02-28 DIAGNOSIS — Z0000 Encounter for general adult medical examination without abnormal findings: Secondary | ICD-10-CM

## 2016-02-28 DIAGNOSIS — N289 Disorder of kidney and ureter, unspecified: Secondary | ICD-10-CM | POA: Diagnosis not present

## 2016-02-28 DIAGNOSIS — R490 Dysphonia: Secondary | ICD-10-CM | POA: Diagnosis not present

## 2016-02-28 HISTORY — DX: Raynaud's syndrome without gangrene: I73.00

## 2016-02-28 HISTORY — DX: Dysphonia: R49.0

## 2016-02-28 LAB — CBC WITH DIFFERENTIAL/PLATELET
Basophils Absolute: 0 cells/uL (ref 0–200)
Basophils Relative: 0 %
Eosinophils Absolute: 195 cells/uL (ref 15–500)
Eosinophils Relative: 3 %
HCT: 40.8 % (ref 35.0–45.0)
Hemoglobin: 13.6 g/dL (ref 11.7–15.5)
Lymphocytes Relative: 20 %
Lymphs Abs: 1300 cells/uL (ref 850–3900)
MCH: 29.3 pg (ref 27.0–33.0)
MCHC: 33.3 g/dL (ref 32.0–36.0)
MCV: 87.9 fL (ref 80.0–100.0)
MPV: 11.9 fL (ref 7.5–12.5)
Monocytes Absolute: 390 cells/uL (ref 200–950)
Monocytes Relative: 6 %
Neutro Abs: 4615 cells/uL (ref 1500–7800)
Neutrophils Relative %: 71 %
Platelets: 271 10*3/uL (ref 140–400)
RBC: 4.64 MIL/uL (ref 3.80–5.10)
RDW: 15.4 % — ABNORMAL HIGH (ref 11.0–15.0)
WBC: 6.5 10*3/uL (ref 3.8–10.8)

## 2016-02-28 LAB — COMPLETE METABOLIC PANEL WITH GFR
ALT: 21 U/L (ref 6–29)
AST: 30 U/L (ref 10–35)
Albumin: 4.2 g/dL (ref 3.6–5.1)
Alkaline Phosphatase: 106 U/L (ref 33–130)
BUN: 22 mg/dL (ref 7–25)
CO2: 24 mmol/L (ref 20–31)
Calcium: 10.2 mg/dL (ref 8.6–10.4)
Chloride: 102 mmol/L (ref 98–110)
Creat: 1.03 mg/dL — ABNORMAL HIGH (ref 0.60–0.88)
GFR, Est African American: 58 mL/min — ABNORMAL LOW (ref >=60)
GFR, Est Non African American: 51 mL/min — ABNORMAL LOW (ref >=60)
Glucose, Bld: 89 mg/dL (ref 65–99)
Potassium: 4.3 mmol/L (ref 3.5–5.3)
Sodium: 139 mmol/L (ref 135–146)
Total Bilirubin: 0.4 mg/dL (ref 0.2–1.2)
Total Protein: 7.2 g/dL (ref 6.1–8.1)

## 2016-02-28 LAB — POCT URINALYSIS DIP (MANUAL ENTRY)
Bilirubin, UA: NEGATIVE
Blood, UA: NEGATIVE
Glucose, UA: NEGATIVE
Ketones, POC UA: NEGATIVE
Nitrite, UA: NEGATIVE
Protein Ur, POC: NEGATIVE
Spec Grav, UA: <=1.005
Urobilinogen, UA: 0.2
pH, UA: 5.5

## 2016-02-28 LAB — LIPID PANEL
Cholesterol: 198 mg/dL (ref 125–200)
HDL: 93 mg/dL (ref >=46)
LDL Cholesterol: 90 mg/dL (ref <130)
Total CHOL/HDL Ratio: 2.1 Ratio (ref <=5.0)
Triglycerides: 74 mg/dL (ref <150)
VLDL: 15 mg/dL (ref <30)

## 2016-02-28 LAB — VITAMIN D 25 HYDROXY (VIT D DEFICIENCY, FRACTURES): Vit D, 25-Hydroxy: 73 ng/mL (ref 30–100)

## 2016-02-28 MED ORDER — ROSUVASTATIN CALCIUM 10 MG PO TABS: ORAL_TABLET | ORAL | Status: DC

## 2016-02-28 NOTE — Progress Notes (Signed)
By signing my name below, I, Mesha Guinyard, attest that this documentation has been prepared under the direction and in the presence of [name], MD.  Electronically Signed: Verlee Monte, Medical Scribe. 02/28/2016. 8:36 AM.  Chief Complaint:  Chief Complaint  Patient presents with  . Annual Exam    HPI: [name] is a 82 y.o. female with a PMHx of HLD, osteoporosis, and breast CA who reports to [facility] today for her annual physical. Pt reports not having any immediate complaints and life is good. Eye: Pt mentions having asteroid's, calciumand phosphates or phospholipids deposits, taken out of her left eye at [facility] and it went well. PT reports she can now see out of her left eye. Pt mentions having cataracts. HENT: Py reports swallowing fine. Pt reports the same consistent voice change that occurred 3 years ago. Pt mentions that her voice comes and goes. Pt mentions quitting smoking 30 years ago. Lungs: Pt reports her breathing is good. Breast CA: Pt reports her breast are good and she's still getting her mammogram. Exercise: Pt mentions getting out and getting more exercise. Pt denies appetite change. Extremities: PT reports her toes are always blue, and cold.  Past Medical History  Diagnosis Date  . Autoimmune disease ([place])   . Dysphagia   . Hyperlipidemia   . Breast cancer ([place]) 2010    cT1 N0 M0; ER+/ PR+/ HER2 neg; s/p lumpectomy 07/2010; started adjuvant hormonal therapy 10/2009  . Barrett's esophagus   . Osteopenia   . Pneumonia   . Cervical cancer ([place])   . Neuromuscular disorder ([place])   . Hypertension   . Glaucoma   . Bone lesion     sacrum  . Osteoporosis   . Osteoporosis 03/18/2012  . Humerus fracture 03/2012    impacted left humueral neck fracture, treated by Dr. Amedeo Plenty  . Fx distal radius NEC-closed   . Fracture of left shoulder    Past Surgical History  Procedure Laterality Date  . Hand surgery    . Ovarian cyst removal  1978  . Appendectomy    .  Breast lumpectomy    . Ortho procedures      multiple  . Abdominal hysterectomy  1972    cancer          1 tube 1 ovary  . Shoulder surgery  1954    left  . Colon polyp removal    . Eye surgery     Social History   Social History  . Marital Status: Married    Spouse Name: N/A  . Number of Children: N/A  . Years of Education: N/A   Social History Main Topics  . Smoking status: Former Smoker    Quit date: 12/16/1986  . Smokeless tobacco: Never Used     Comment: FORMER SMOKER 30 YEARS AGO  . Alcohol Use: 0.0 oz/week    0 Standard drinks or equivalent per week     Comment: OCCASIONALLY  . Drug Use: No  . Sexual Activity: Not Asked   Other Topics Concern  . None   Social History Narrative   Married   Exercise: Yes   Family History  Problem Relation Age of Onset  . Heart disease Father   . Cancer Sister     lung, kidney  . Cancer Brother     brain  . Cancer Paternal Aunt     breast  . Cancer Brother     liver, lung  . Osteoporosis Neg Hx  Allergies  Allergen Reactions  . Daypro [Oxaprozin] Swelling    Face and tongue   Prior to Admission medications   Medication Sig Start Date End Date Taking? Authorizing Provider  Calcium Carbonate-Vitamin D (CALCIUM + D PO) Take 1 capsule by mouth 3 (three) times daily. Taking 2 pills per day    Historical Provider, MD  doxycycline (VIBRAMYCIN) 100 MG capsule Take 1 capsule (100 mg total) by mouth 2 (two) times daily. 10/30/15   Darreld Mclean, MD  Multiple Vitamin (MULTIVITAMIN) capsule Take 1 capsule by mouth daily.    Historical Provider, MD  rosuvastatin (CRESTOR) 10 MG tablet TAKE 1 BY MOUTH DAILY 08/30/15   Darlyne Russian, MD  Travoprost (TRAVATAN OP) Apply to eye.    Historical Provider, MD     ROS: The patient denies fevers, chills, night sweats, unintentional weight loss, chest pain, palpitations, wheezing, dyspnea on exertion, nausea, vomiting, abdominal pain, dysuria, hematuria, melena, numbness, weakness, or  tingling. Pt has voice change  All other systems have been reviewed and were otherwise negative with the exception of those mentioned in the HPI and as above.    PHYSICAL EXAM: Filed Vitals:   02/28/16 0826  BP: 120/80  Pulse: 63  Temp: 97.7 F (36.5 C)  Resp: 16   Body mass index is 19.86 kg/(m^2).   General: Alert, no acute distress HEENT:  Normocephalic, atraumatic, oropharynx patent. Has hoarseness to voice. Eye: [facility]. She's had cataracts surgery on her left eye. Pt has small cataracts on right eye. Cardiovascular:  Regular rate and rhythm, no rubs murmurs or gallops.  No Carotid bruits, radial pulse intact. No pedal edema.  Respiratory: Clear to auscultation bilaterally.  No wheezes, rales, or rhonchi.  No cyanosis, no use of accessory musculature Breast: Breast are fine. Abdominal: No organomegaly, abdomen is soft and non-tender, positive bowel sounds.  No masses. Musculoskeletal: Gait intact. No edema, tenderness. Has bluish coloring in her fingers and toes but she has good circulation to her pulses. Skin: No rashes. Neurologic: Facial musculature symmetric. Psychiatric: Patient acts appropriately throughout our interaction. Lymphatic: No cervical or submandibular lymphadenopathy   LABS:    EKG/XRAY:   Primary read interpreted by Dr. Everlene Farrier at [facility].   ASSESSMENT/PLAN: Patient is doing great. She is only on Crestor and calcium vitamin D supplementation. She is adherent to her medications. She has had some hoarseness and ENT referral made for their evaluation. Overall she is in great shape.I personally performed the services described in this documentation, which was scribed in my presence. The recorded information has been reviewed and is accurate.   Gross sideeffects, risk and benefits, and alternatives of medications d/w patient. Patient is aware that all medications have potential sideeffects and we are unable to predict every sideeffect or drug-drug  interaction that may occur.  [name] MD 02/28/2016 8:47 AM

## 2016-02-28 NOTE — Patient Instructions (Addendum)
IF you received an x-ray today, you will receive an invoice from [facility]. Please contact [facility] at [phone] with questions or concerns regarding your invoice.   IF you received labwork today, you will receive an invoice from [employer]. Please contact [facility] at [phone] with questions or concerns regarding your invoice.   Our billing staff will not be able to assist you with questions regarding bills from these companies.  You will be contacted with the lab results as soon as they are available. The fastest way to get your results is to activate your My Chart account. Instructions are located on the last page of this paperwork. If you have not heard from [place] regarding the results in 2 weeks, please contact this office.   Menopause is a normal process in which your reproductive ability comes to an end. This process happens gradually over a span of months to years, usually between the ages of 32 and 71. Menopause is complete when you have missed 12 consecutive menstrual periods. It is important to talk with your health care provider about some of the most common conditions that affect postmenopausal women, such as heart disease, cancer, and bone loss (osteoporosis). Adopting a healthy lifestyle and getting preventive care can help to promote your health and wellness. Those actions can also lower your chances of developing some of these common conditions. WHAT SHOULD I KNOW ABOUT MENOPAUSE? During menopause, you may experience a number of symptoms, such as:  Moderate-to-severe hot flashes.  Night sweats.  Decrease in sex drive.  Mood swings.  Headaches.  Tiredness.  Irritability.  Memory problems.  Insomnia. Choosing to treat or not to treat menopausal changes is an individual decision that you make with your health care provider. WHAT SHOULD I KNOW ABOUT HORMONE REPLACEMENT THERAPY AND SUPPLEMENTS? Hormone therapy  products are effective for treating symptoms that are associated with menopause, such as hot flashes and night sweats. Hormone replacement carries certain risks, especially as you become older. If you are thinking about using estrogen or estrogen with progestin treatments, discuss the benefits and risks with your health care provider. WHAT SHOULD I KNOW ABOUT HEART DISEASE AND STROKE? Heart disease, heart attack, and stroke become more likely as you age. This may be due, in part, to the hormonal changes that your body experiences during menopause. These can affect how your body processes dietary fats, triglycerides, and cholesterol. Heart attack and stroke are both medical emergencies. There are many things that you can do to help prevent heart disease and stroke:  Have your blood pressure checked at least every 1-2 years. High blood pressure causes heart disease and increases the risk of stroke.  If you are 59-1 years old, ask your health care provider if you should take aspirin to prevent a heart attack or a stroke.  Do not use any tobacco products, including cigarettes, chewing tobacco, or electronic cigarettes. If you need help quitting, ask your health care provider.  It is important to eat a healthy diet and maintain a healthy weight.  Be sure to include plenty of vegetables, fruits, low-fat dairy products, and lean protein.  Avoid eating foods that are high in solid fats, added sugars, or salt (sodium).  Get regular exercise. This is one of the most important things that you can do for your health.  Try to exercise for at least 150 minutes each week. The type of exercise that you do should increase your heart rate and make you  sweat. This is known as moderate-intensity exercise.  Try to do strengthening exercises at least twice each week. Do these in addition to the moderate-intensity exercise.  Know your numbers.Ask your health care provider to check your cholesterol and your blood  glucose. Continue to have your blood tested as directed by your health care provider. WHAT SHOULD I KNOW ABOUT CANCER SCREENING? There are several types of cancer. Take the following steps to reduce your risk and to catch any cancer development as early as possible. Breast Cancer  Practice breast self-awareness.  This means understanding how your breasts normally appear and feel.  It also means doing regular breast self-exams. Let your health care provider know about any changes, no matter how small.  If you are 64 or older, have a clinician do a breast exam (clinical breast exam or CBE) every year. Depending on your age, family history, and medical history, it may be recommended that you also have a yearly breast X-ray (mammogram).  If you have a family history of breast cancer, talk with your health care provider about genetic screening.  If you are at high risk for breast cancer, talk with your health care provider about having an MRI and a mammogram every year.  Breast cancer (BRCA) gene test is recommended for women who have family members with BRCA-related cancers. Results of the assessment will determine the need for genetic counseling and BRCA1 and for BRCA2 testing. BRCA-related cancers include these types:  Breast. This occurs in males or females.  Ovarian.  Tubal. This may also be called fallopian tube cancer.  Cancer of the abdominal or pelvic lining (peritoneal cancer).  Prostate.  Pancreatic. Cervical, Uterine, and Ovarian Cancer Your health care provider may recommend that you be screened regularly for cancer of the pelvic organs. These include your ovaries, uterus, and vagina. This screening involves a pelvic exam, which includes checking for microscopic changes to the surface of your cervix (Pap test).  For women ages 21-65, health care providers may recommend a pelvic exam and a Pap test every three years. For women ages 64-65, they may recommend the Pap test and  pelvic exam, combined with testing for human papilloma virus (HPV), every five years. Some types of HPV increase your risk of cervical cancer. Testing for HPV may also be done on women of any age who have unclear Pap test results.  Other health care providers may not recommend any screening for nonpregnant women who are considered low risk for pelvic cancer and have no symptoms. Ask your health care provider if a screening pelvic exam is right for you.  If you have had past treatment for cervical cancer or a condition that could lead to cancer, you need Pap tests and screening for cancer for at least 20 years after your treatment. If Pap tests have been discontinued for you, your risk factors (such as having a new sexual partner) need to be reassessed to determine if you should start having screenings again. Some women have medical problems that increase the chance of getting cervical cancer. In these cases, your health care provider may recommend that you have screening and Pap tests more often.  If you have a family history of uterine cancer or ovarian cancer, talk with your health care provider about genetic screening.  If you have vaginal bleeding after reaching menopause, tell your health care provider.  There are currently no reliable tests available to screen for ovarian cancer. Lung Cancer Lung cancer screening is recommended for adults  16-67 years old who are at high risk for lung cancer because of a history of smoking. A yearly low-dose CT scan of the lungs is recommended if you:  Currently smoke.  Have a history of at least 30 pack-years of smoking and you currently smoke or have quit within the past 15 years. A pack-year is smoking an average of one pack of cigarettes per day for one year. Yearly screening should:  Continue until it has been 15 years since you quit.  Stop if you develop a health problem that would prevent you from having lung cancer treatment. Colorectal  Cancer  This type of cancer can be detected and can often be prevented.  Routine colorectal cancer screening usually begins at age 25 and continues through age 88.  If you have risk factors for colon cancer, your health care provider may recommend that you be screened at an earlier age.  If you have a family history of colorectal cancer, talk with your health care provider about genetic screening.  Your health care provider may also recommend using home test kits to check for hidden blood in your stool.  A small camera at the end of a tube can be used to examine your colon directly (sigmoidoscopy or colonoscopy). This is done to check for the earliest forms of colorectal cancer.  Direct examination of the colon should be repeated every 5-10 years until age 11. However, if early forms of precancerous polyps or small growths are found or if you have a family history or genetic risk for colorectal cancer, you may need to be screened more often. Skin Cancer  Check your skin from head to toe regularly.  Monitor any moles. Be sure to tell your health care provider:  About any new moles or changes in moles, especially if there is a change in a mole's shape or color.  If you have a mole that is larger than the size of a pencil eraser.  If any of your family members has a history of skin cancer, especially at a young age, talk with your health care provider about genetic screening.  Always use sunscreen. Apply sunscreen liberally and repeatedly throughout the day.  Whenever you are outside, protect yourself by wearing long sleeves, pants, a wide-brimmed hat, and sunglasses. WHAT SHOULD I KNOW ABOUT OSTEOPOROSIS? Osteoporosis is a condition in which bone destruction happens more quickly than new bone creation. After menopause, you may be at an increased risk for osteoporosis. To help prevent osteoporosis or the bone fractures that can happen because of osteoporosis, the following is  recommended:  If you are 56-66 years old, get at least 1,000 mg of calcium and at least 600 mg of vitamin D per day.  If you are older than age 101 but younger than age 56, get at least 1,200 mg of calcium and at least 600 mg of vitamin D per day.  If you are older than age 2, get at least 1,200 mg of calcium and at least 800 mg of vitamin D per day. Smoking and excessive alcohol intake increase the risk of osteoporosis. Eat foods that are rich in calcium and vitamin D, and do weight-bearing exercises several times each week as directed by your health care provider. WHAT SHOULD I KNOW ABOUT HOW MENOPAUSE AFFECTS [place]? Depression may occur at any age, but it is more common as you become older. Common symptoms of depression include:  Low or sad mood.  Changes in sleep patterns.  Changes  in appetite or eating patterns.  Feeling an overall lack of motivation or enjoyment of activities that you previously enjoyed.  Frequent crying spells. Talk with your health care provider if you think that you are experiencing depression. WHAT SHOULD I KNOW ABOUT IMMUNIZATIONS? It is important that you get and maintain your immunizations. These include:  Tetanus, diphtheria, and pertussis (Tdap) booster vaccine.  Influenza every year before the flu season begins.  Pneumonia vaccine.  Shingles vaccine. Your health care provider may also recommend other immunizations.   This information is not intended to replace advice given to you by your health care provider. Make sure you discuss any questions you have with your health care provider.   Document Released: 11/21/2005 Document Revised: 10/20/2014 Document Reviewed: 06/01/2014 Elsevier Interactive Patient Education [employer].

## 2016-03-03 LAB — POC HEMOCCULT BLD/STL (HOME/3-CARD/SCREEN)
Card #2 Fecal Occult Blod, POC: NEGATIVE
Card #3 Fecal Occult Blood, POC: NEGATIVE
Fecal Occult Blood, POC: NEGATIVE

## 2016-03-03 NOTE — Addendum Note (Signed)
Addended by: Benson Setting L on: 03/03/2016 12:24 PM   Modules accepted: Orders

## 2016-03-14 ENCOUNTER — Ambulatory Visit: Payer: Medicare Other | Admitting: Endocrinology

## 2016-03-19 DIAGNOSIS — K219 Gastro-esophageal reflux disease without esophagitis: Secondary | ICD-10-CM | POA: Insufficient documentation

## 2016-03-19 DIAGNOSIS — Z923 Personal history of irradiation: Secondary | ICD-10-CM | POA: Diagnosis not present

## 2016-03-19 DIAGNOSIS — J383 Other diseases of vocal cords: Secondary | ICD-10-CM | POA: Diagnosis not present

## 2016-03-19 DIAGNOSIS — Z87891 Personal history of nicotine dependence: Secondary | ICD-10-CM | POA: Diagnosis not present

## 2016-03-19 DIAGNOSIS — R490 Dysphonia: Secondary | ICD-10-CM | POA: Diagnosis not present

## 2016-03-19 DIAGNOSIS — Z853 Personal history of malignant neoplasm of breast: Secondary | ICD-10-CM | POA: Diagnosis not present

## 2016-03-19 HISTORY — DX: Gastro-esophageal reflux disease without esophagitis: K21.9

## 2016-03-20 DIAGNOSIS — H2511 Age-related nuclear cataract, right eye: Secondary | ICD-10-CM | POA: Diagnosis not present

## 2016-07-11 ENCOUNTER — Other Ambulatory Visit: Payer: Self-pay | Admitting: Family Medicine

## 2016-07-11 DIAGNOSIS — Z853 Personal history of malignant neoplasm of breast: Secondary | ICD-10-CM

## 2016-07-11 DIAGNOSIS — Z9889 Other specified postprocedural states: Secondary | ICD-10-CM

## 2016-07-15 ENCOUNTER — Ambulatory Visit (INDEPENDENT_AMBULATORY_CARE_PROVIDER_SITE_OTHER): Payer: Medicare Other | Admitting: Emergency Medicine

## 2016-07-15 DIAGNOSIS — Z23 Encounter for immunization: Secondary | ICD-10-CM

## 2016-08-13 ENCOUNTER — Ambulatory Visit
Admission: RE | Admit: 2016-08-13 | Discharge: 2016-08-13 | Disposition: A | Discharge status: Home / Self Care | Payer: Medicare Other | Source: Ambulatory Visit | Attending: Family Medicine | Admitting: Family Medicine

## 2016-08-13 DIAGNOSIS — Z9889 Other specified postprocedural states: Secondary | ICD-10-CM

## 2016-08-13 DIAGNOSIS — Z853 Personal history of malignant neoplasm of breast: Secondary | ICD-10-CM

## 2016-08-13 DIAGNOSIS — R928 Other abnormal and inconclusive findings on diagnostic imaging of breast: Secondary | ICD-10-CM | POA: Diagnosis not present

## 2016-08-22 DIAGNOSIS — H01001 Unspecified blepharitis right upper eyelid: Secondary | ICD-10-CM | POA: Diagnosis not present

## 2016-08-22 DIAGNOSIS — H01004 Unspecified blepharitis left upper eyelid: Secondary | ICD-10-CM | POA: Diagnosis not present

## 2016-08-22 DIAGNOSIS — H01005 Unspecified blepharitis left lower eyelid: Secondary | ICD-10-CM | POA: Diagnosis not present

## 2016-08-22 DIAGNOSIS — Z961 Presence of intraocular lens: Secondary | ICD-10-CM | POA: Diagnosis not present

## 2016-08-22 DIAGNOSIS — H401133 Primary open-angle glaucoma, bilateral, severe stage: Secondary | ICD-10-CM | POA: Diagnosis not present

## 2016-08-22 DIAGNOSIS — D2312 Other benign neoplasm of skin of left eyelid, including canthus: Secondary | ICD-10-CM | POA: Diagnosis not present

## 2016-08-22 DIAGNOSIS — H01002 Unspecified blepharitis right lower eyelid: Secondary | ICD-10-CM | POA: Diagnosis not present

## 2016-08-27 ENCOUNTER — Encounter: Payer: Self-pay | Admitting: Family Medicine

## 2016-08-27 ENCOUNTER — Ambulatory Visit (INDEPENDENT_AMBULATORY_CARE_PROVIDER_SITE_OTHER): Payer: Medicare Other | Admitting: Family Medicine

## 2016-08-27 ENCOUNTER — Ambulatory Visit (HOSPITAL_BASED_OUTPATIENT_CLINIC_OR_DEPARTMENT_OTHER)
Admission: RE | Admit: 2016-08-27 | Discharge: 2016-08-27 | Disposition: A | Discharge status: Home / Self Care | Payer: Medicare Other | Source: Ambulatory Visit | Attending: Family Medicine | Admitting: Family Medicine

## 2016-08-27 VITALS — BP 142/80 | HR 53 | Temp 97.7°F | Ht 62.0 in | Wt 111.0 lb

## 2016-08-27 DIAGNOSIS — M8589 Other specified disorders of bone density and structure, multiple sites: Secondary | ICD-10-CM | POA: Diagnosis not present

## 2016-08-27 DIAGNOSIS — M810 Age-related osteoporosis without current pathological fracture: Secondary | ICD-10-CM

## 2016-08-27 DIAGNOSIS — M79651 Pain in right thigh: Secondary | ICD-10-CM | POA: Diagnosis not present

## 2016-08-27 DIAGNOSIS — E782 Mixed hyperlipidemia: Secondary | ICD-10-CM | POA: Diagnosis not present

## 2016-08-27 DIAGNOSIS — M85832 Other specified disorders of bone density and structure, left forearm: Secondary | ICD-10-CM | POA: Diagnosis not present

## 2016-08-27 DIAGNOSIS — M85852 Other specified disorders of bone density and structure, left thigh: Secondary | ICD-10-CM | POA: Diagnosis not present

## 2016-08-27 DIAGNOSIS — M25551 Pain in right hip: Secondary | ICD-10-CM | POA: Diagnosis not present

## 2016-08-27 LAB — COMPREHENSIVE METABOLIC PANEL
ALT: 24 U/L (ref 0–35)
AST: 33 U/L (ref 0–37)
Albumin: 4.3 g/dL (ref 3.5–5.2)
Alkaline Phosphatase: 91 U/L (ref 39–117)
BUN: 19 mg/dL (ref 6–23)
CO2: 29 mEq/L (ref 19–32)
Calcium: 9.9 mg/dL (ref 8.4–10.5)
Chloride: 104 mEq/L (ref 96–112)
Creatinine, Ser: 0.95 mg/dL (ref 0.40–1.20)
GFR: 59.66 mL/min — ABNORMAL LOW (ref >60.00)
Glucose, Bld: 84 mg/dL (ref 70–99)
Potassium: 4.3 mEq/L (ref 3.5–5.1)
Sodium: 140 mEq/L (ref 135–145)
Total Bilirubin: 0.5 mg/dL (ref 0.2–1.2)
Total Protein: 7.3 g/dL (ref 6.0–8.3)

## 2016-08-27 NOTE — Patient Instructions (Signed)
Please go to lab, and then to the imaging dept on the ground floor for x-rays of your thigh and hip.  I will set up a bone density exam for you as well It was great to see you today- I'll be in touch with your labs and x-rays asap Let me know if any change or worsening of your leg pains

## 2016-08-27 NOTE — Progress Notes (Addendum)
[place] at [facility] [address], [place],  [address] [phone]  Date:  08/27/2016   Name:  [name]   DOB:  [birth year]   MRN:  [id number]  PCP:  Jenny Reichmann, MD    Chief Complaint: Follow-up (Pt is here for 6 month follow up. Former pt of d)   History of Present Illness:  [name] is a 82 y.o. very pleasant female patient who presents with the following:  Here today as a new pt to me at [facility] although I did see her for an acute visit at [facility] in January.  Former pt of Dr. Everlene Farrier- her HPI from her last visit in May follows.  Dr. Everlene Farrier has now retired.    [name] is a 82 y.o. female with a PMHx of HLD, osteoporosis, and breast CA who reports to [facility] today for her annual physical. Pt reports not having any immediate complaints and life is good. Eye: Pt mentions having asteroid's, calciumand phosphates or phospholipids deposits, taken out of her left eye at [facility] and it went well. PT reports she can now see out of her left eye. Pt mentions having cataracts. HENT: Py reports swallowing fine. Pt reports the same consistent voice change that occurred 3 years ago. Pt mentions that her voice comes and goes. Pt mentions quitting smoking 30 years ago. Lungs: Pt reports her breathing is good. Breast CA: Pt reports her breast are good and she's still getting her mammogram. Exercise: Pt mentions getting out and getting more exercise. Pt denies appetite change. Extremities: PT reports her toes are always blue, and cold.  She takes crestor, no other rx medications.   Last labs in May of this year.   She does mammograms annually- she was told that she no longer needs the 3D mammogram and can have the regular protocol She has a history of osteoporosis and did use fosamax "for years," she stopped using this med about 4-5 years ago.   Last dexa 1/15.  She wonders if she is due to have a repeat dexa today.   She has suffered  multiple fractures over the last several years. Humerus, wrist, hands. No fracture in 2017 so far.   She worked for [employer] paper in the past- she retired many years ago.  She has 2 daughters, no grands.  One lives in [place] and the other in [place].  She is married to [place]- they have been married for 32 years.  She was married previously and divorced, then married [place] years later.    She is active and walks several miles a day.    BP Readings from Last 3 Encounters:  08/27/16 (!) 159/75  02/28/16 120/80  10/30/15 (!) 142/80     Patient Active Problem List   Diagnosis Date Noted  . Hoarseness of voice 02/28/2016  . Raynaud's phenomenon 02/28/2016  . Esophageal spasm 09/20/2013  . Trigger finger 07/08/2012  . Osteoporosis 03/18/2012  . Fracture, shoulder 03/06/2012  . Hyperlipidemia   . Breast cancer [facility])     Past Medical History:  Diagnosis Date  . Autoimmune disease ([place])   . Barrett's esophagus   . Bone lesion    sacrum  . Breast cancer ([place]) 2010   cT1 N0 M0; ER+/ PR+/ HER2 neg; s/p lumpectomy 07/2010; started adjuvant hormonal therapy 10/2009  . Cervical cancer ([place])   . Dysphagia   . Fracture of left shoulder   .  Fx distal radius NEC-closed   . Glaucoma   . Humerus fracture 03/2012   impacted left humueral neck fracture, treated by Dr. Amedeo Plenty  . Hyperlipidemia   . Hypertension   . Neuromuscular disorder ([place])   . Osteopenia   . Osteoporosis   . Osteoporosis 03/18/2012  . Pneumonia     Past Surgical History:  Procedure Laterality Date  . ABDOMINAL HYSTERECTOMY  1972   cancer          1 tube 1 ovary  . APPENDECTOMY    . BREAST LUMPECTOMY    . colon polyp removal    . EYE SURGERY    . HAND SURGERY    . ortho procedures     multiple  . OVARIAN CYST REMOVAL  1978  . [place]   left    Social History  Substance Use Topics  . Smoking status: Former Smoker    Quit date: 12/16/1986  . Smokeless tobacco: Never Used     Comment: FORMER  SMOKER 30 YEARS AGO  . Alcohol use 0.0 oz/week     Comment: OCCASIONALLY    Family History  Problem Relation Age of Onset  . Heart disease Father   . Cancer Sister     lung, kidney  . Cancer Brother     brain  . Cancer Paternal Aunt     breast  . Cancer Brother     liver, lung  . Osteoporosis Neg Hx     Allergies  Allergen Reactions  . Daypro [Oxaprozin] Swelling    Face and tongue    Medication list has been reviewed and updated.  Current Outpatient Prescriptions on File Prior to Visit  Medication Sig Dispense Refill  . Calcium Carbonate-Vitamin D (CALCIUM + D PO) Take 1 capsule by mouth 3 (three) times daily. Taking 2 pills per day    . latanoprost (XALATAN) 0.005 % ophthalmic solution 1 drop at bedtime.    . Multiple Vitamin (MULTIVITAMIN) capsule Take 1 capsule by mouth daily.    . rosuvastatin (CRESTOR) 10 MG tablet TAKE 1 BY MOUTH DAILY 90 tablet 3   No current facility-administered medications on file prior to visit.     Review of Systems:  She has noted an occasional pain in her right inner thigh- it was bothering her yesterday but is now gone.   This has been present for a couple of years, worse over the last year.  Only on the right leg.  She is not aware of any injury.  "I will be walking and all of a sudden it will just feel like it's going to give away."  She walks about 5 miles a day for exercise She does not feel like the pain is in her hip joint  As per HPI- otherwise negative.   Physical Examination: Vitals:   08/27/16 0951 08/27/16 0959  BP: (!) 150/68 (!) 159/75  Pulse: (!) 53   Temp: 97.7 F (36.5 C)    Vitals:   08/27/16 0951  Weight: 111 lb (50.3 kg)  Height: '5\' 2"'$  (1.575 m)   Body mass index is 20.3 kg/m. Ideal Body Weight: Weight in (lb) to have BMI = 25: 136.4  GEN: WDWN, NAD, Non-toxic, A & O x 3, appears stated age, looks well HEENT: Atraumatic, Normocephalic. Neck supple. No masses, No LAD. Ears and Nose: No external  deformity. CV: RRR, No M/G/R. No JVD. No thrill. No extra heart sounds. PULM: CTA B, no wheezes, crackles, rhonchi. No  retractions. No resp. distress. No accessory muscle use. ABD: S, NT, ND EXTR: No c/c/e NEURO Normal gait.  PSYCH: Normally interactive. Conversant. Not depressed or anxious appearing.  Calm demeanor.  Right hip has excellent ROM, no pain with FABR or other manipulation of the joint. The skin over her thigh is normal- no redness or lesion. No tenderness to palpation of the thigh Normal BLE strength and DTR  Dg Bone Density  Result Date: 08/27/2016 EXAM: DUAL X-RAY ABSORPTIOMETRY (DXA) FOR BONE MINERAL DENSITY IMPRESSION: Referring Physician:  Darreld Mclean PATIENT: Name: Marji, Kuehnel Patient ID: [id number] Birth Date: June 23, 1931 Height: 62.5 in. Sex: Female Measured: 08/27/2016 Weight: 109.2 lbs. Indications: Advanced Age, Caucasian, Chemotherapy for Cancer, Estrogen Deficiency, History of Fracture (Adult), Hysterectomy, Oophorectomy(Unilateral), Post Menopausal, Previous Tobacco User Fractures: Hand, Shoulder, Wrist Treatments: Calcium, Multivitamin, Vitamin D ASSESSMENT: The BMD measured at Femur Neck Left is 0.753 g/cm2 with a T-score of -2.1. This patient is considered osteopenic according to [facility]) criteria. Lumbar spine was not utilized due to advanced degenerative changes. Site Region Measured Date Measured Age WHO YA BMD Classification T-score DualFemur Total Mean 08/27/2016 83.3 years Osteopenia -2.0 0.752 g/cm2 Left Forearm Radius 33% 08/27/2016 83.3 Osteopenia -2.1 0.695 g/cm2 World Health Organization [facility]) criteria for post-menopausal, Caucasian Women: Normal       T-score at or above -1 SD Osteopenia   T-score between -1 and -2.5 SD Osteoporosis T-score at or below -2.5 SD RECOMMENDATION: [place] recommends that FDA-approved medical therapies be considered in postmenopausal women and men age 70 or older with a: 1. Hip or  vertebral (clinical or morphometric) fracture. 2. T-score of < -2.5 at the spine or hip. 3. Ten-year fracture probability by FRAX of 3% or greater for hip fracture or 20% or greater for major osteoporotic fracture. All treatment decisions require clinical judgment and consideration of individual patient factors, including patient preferences, co-morbidities, previous drug use, risk factors not captured in the FRAX model (e.g. falls, vitamin D deficiency, increased bone turnover, interval significant decline in bone density) and possible under - or over-estimation of fracture risk by FRAX. All patients should ensure an adequate intake of dietary calcium (1200 mg/d) and vitamin D (800 IU daily) unless contraindicated. FOLLOW-UP: People with diagnosed cases of osteoporosis or at high risk for fracture should have regular bone mineral density tests. For patients eligible for Medicare, routine testing is allowed once every 2 years. The testing frequency can be increased to one year for patients who have rapidly progressing disease, those who are receiving or discontinuing medical therapy to restore bone mass, or have additional risk factors. I have reviewed this report and agree with the above findings.  Radiology Patient: [name] Referring Physician: Darreld Mclean Birth Date: [birth year] [age] Patient ID: [id number] Height: 62.5 in. Weight: 109.2 lbs. Measured: 08/27/2016 10:56:36 AM (16 SP 2) Sex: Female Ethnicity: White Analyzed: 08/27/2016 11:05:18 AM (16 SP 2) FRAX* 10-year Probability of Fracture Based on femoral neck BMD: DualFemur (Left) Major Osteoporotic Fracture: 19.1% Hip Fracture:                6.0% Population:                  [place] (Caucasian) Risk Factors:                History of Fracture (Adult) *FRAX is a [place] [employer] for  Metabolic Bone Disease, a World Pharmacologist (WHO) [place]. ASSESSMENT: The  probability of a major osteoporotic fracture is 19.1% within the next ten years. The probability of a hip fracture is 6.0% within the next ten years. Electronically Signed   By: Van Clines M.D.   On: 08/27/2016 12:53   Mm Diag Breast Tomo Bilateral  Result Date: 08/13/2016 CLINICAL DATA:  History of malignant lumpectomy of the left breast in 2010.Annual re-evaluation. EXAM: 2D DIGITAL DIAGNOSTIC BILATERAL MAMMOGRAM WITH CAD AND ADJUNCT TOMO COMPARISON:  Previous exam(s). ACR Breast Density Category b: There are scattered areas of fibroglandular density. FINDINGS: There are stable scarring changes located centrally within the left breast related to the patient's lumpectomy. The breast parenchymal pattern is stable bilaterally. There is no specific evidence for recurrent tumor or developing malignancy within either breast. Mammographic images were processed with CAD. IMPRESSION: No findings worrisome for recurrent tumor or developing malignancy. RECOMMENDATION: Bilateral screening mammography in 1 year. I have discussed the findings and recommendations with the patient. Results were also provided in writing at the conclusion of the visit. If applicable, a reminder letter will be sent to the patient regarding the next appointment. BI-RADS CATEGORY  1: Negative. Electronically Signed   By: Altamese Cabal M.D.   On: 08/13/2016 10:07   Dg Hip Unilat W Or W/o Pelvis 2-3 Views Right  Result Date: 08/27/2016 CLINICAL DATA:  Intermittent right thigh/leg pain for 2 years. Progressive pain. No known injury. EXAM: DG HIP (WITH OR WITHOUT PELVIS) 2-3V RIGHT COMPARISON:  Pelvis and left hip radiographs 08/11/2014. FINDINGS: No fracture or dislocation. The alignment and joint spaces are maintained. Mild degenerate spurring of both hips, stable. No radiographic evidence of focal lesion. IMPRESSION: Mild for age degenerative change of both hips, stable since October 2015. No acute abnormality. Electronically  Signed   By: Jeb Levering M.D.   On: 08/27/2016 12:38   Dg Femur, Min 2 Views Right  Result Date: 08/27/2016 CLINICAL DATA:  Intermittent right leg/ thigh pain for 2 years, progressive. No known injury. EXAM: RIGHT FEMUR 2 VIEWS COMPARISON:  No prior femur radiographs. Proximal femur included on concurrently performed hip radiographs. FINDINGS: There is no evidence of fracture or other focal bone lesions. Soft tissues are unremarkable. Chondrocalcinosis incidentally noted about the knee joint. IMPRESSION: No acute abnormality of the right femur. Electronically Signed   By: Jeb Levering M.D.   On: 08/27/2016 12:40    Assessment and Plan: Right thigh pain - Plan: DG HIP UNILAT W OR W/O PELVIS 2-3 VIEWS RIGHT, DG FEMUR, MIN 2 VIEWS RIGHT, Comprehensive metabolic panel  Mixed hyperlipidemia - Plan: Comprehensive metabolic panel  Age-related osteoporosis without current pathological fracture - Plan: DG Bone Density  X-rays benign as above- no cause for her leg pain noted.  Await her labs and will be in touch with her. Also need to discuss persistent osteoporosis as shown on dexa above  Signed Lamar Blinks, MD  Received her labs and gave her a call on 11/19- her labs are normal and x-rays look good.  She would like to go back on fosamax as her 10 year risk of hip fracture is 6%.  We will plan to use this for 1-2 years and then repeat her dexa.  rx for fosamax sent in for her She will see me in 6 months Her right thigh pain has been quiet the last 4-5 days- she will let me know if this comes back  Results for orders placed or  performed in visit on 08/27/16  Comprehensive metabolic panel  Result Value Ref Range   Sodium 140 135 - 145 mEq/L   Potassium 4.3 3.5 - 5.1 mEq/L   Chloride 104 96 - 112 mEq/L   CO2 29 19 - 32 mEq/L   Glucose, Bld 84 70 - 99 mg/dL   BUN 19 6 - 23 mg/dL   Creatinine, Ser 0.95 0.40 - 1.20 mg/dL   Total Bilirubin 0.5 0.2 - 1.2 mg/dL   Alkaline Phosphatase 91  39 - 117 U/L   AST 33 0 - 37 U/L   ALT 24 0 - 35 U/L   Total Protein 7.3 6.0 - 8.3 g/dL   Albumin 4.3 3.5 - 5.2 g/dL   Calcium 9.9 8.4 - 10.5 mg/dL   GFR 59.66 (L) >60.00 mL/min

## 2016-08-27 NOTE — Progress Notes (Signed)
Pre visit review using our clinic review tool, if applicable. No additional management support is needed unless otherwise documented below in the visit note. 

## 2016-08-31 ENCOUNTER — Encounter: Payer: Self-pay | Admitting: Family Medicine

## 2016-08-31 MED ORDER — ALENDRONATE SODIUM 70 MG PO TABS: 70.0000 mg | ORAL_TABLET | ORAL | 3 refills | Status: DC

## 2016-08-31 NOTE — Addendum Note (Signed)
Addended by: Lamar Blinks C on: 08/31/2016 07:14 PM   Modules accepted: Orders

## 2016-09-08 ENCOUNTER — Telehealth: Payer: Self-pay | Admitting: Family Medicine

## 2016-09-08 NOTE — Telephone Encounter (Signed)
Pt preferred Pharmacies changed in chart per Pt request.

## 2016-09-08 NOTE — Telephone Encounter (Signed)
Caller name: [place] Relation to LR:JPVG Call back number: [phone] Pharmacy:[facility]) [place], [place]  Reason for call: Pt just wanted to inform for future reference to please send her rx to [facility] mentioned above.

## 2016-09-28 ENCOUNTER — Encounter (HOSPITAL_BASED_OUTPATIENT_CLINIC_OR_DEPARTMENT_OTHER): Payer: Self-pay | Admitting: Emergency Medicine

## 2016-09-28 ENCOUNTER — Emergency Department (HOSPITAL_BASED_OUTPATIENT_CLINIC_OR_DEPARTMENT_OTHER)
Admission: EM | Admit: 2016-09-28 | Discharge: 2016-09-29 | Disposition: A | Discharge status: Home / Self Care | Payer: Medicare Other | Attending: Emergency Medicine | Admitting: Emergency Medicine

## 2016-09-28 ENCOUNTER — Emergency Department (HOSPITAL_BASED_OUTPATIENT_CLINIC_OR_DEPARTMENT_OTHER): Payer: Medicare Other

## 2016-09-28 DIAGNOSIS — M62830 Muscle spasm of back: Secondary | ICD-10-CM | POA: Insufficient documentation

## 2016-09-28 DIAGNOSIS — M62838 Other muscle spasm: Secondary | ICD-10-CM | POA: Diagnosis not present

## 2016-09-28 DIAGNOSIS — Z8541 Personal history of malignant neoplasm of cervix uteri: Secondary | ICD-10-CM | POA: Diagnosis not present

## 2016-09-28 DIAGNOSIS — I10 Essential (primary) hypertension: Secondary | ICD-10-CM | POA: Diagnosis not present

## 2016-09-28 DIAGNOSIS — Z79899 Other long term (current) drug therapy: Secondary | ICD-10-CM | POA: Diagnosis not present

## 2016-09-28 DIAGNOSIS — Z853 Personal history of malignant neoplasm of breast: Secondary | ICD-10-CM | POA: Diagnosis not present

## 2016-09-28 DIAGNOSIS — M25511 Pain in right shoulder: Secondary | ICD-10-CM | POA: Diagnosis not present

## 2016-09-28 DIAGNOSIS — Z87891 Personal history of nicotine dependence: Secondary | ICD-10-CM | POA: Diagnosis not present

## 2016-09-28 MED ORDER — OXYCODONE-ACETAMINOPHEN 5-325 MG PO TABS
0.5000 | ORAL_TABLET | Freq: Once | ORAL | Status: AC
  Administered 2016-09-28: 0.5 via ORAL
  Filled 2016-09-28: qty 1

## 2016-09-28 MED ORDER — ONDANSETRON 8 MG PO TBDP
8.0000 mg | ORAL_TABLET | Freq: Once | ORAL | Status: AC
  Administered 2016-09-28: 8 mg via ORAL
  Filled 2016-09-28: qty 1

## 2016-09-28 NOTE — ED Provider Notes (Signed)
[facility] DEPT [facility] Provider Note   CSN: [id number] Arrival date & time: 09/28/16  2253  By signing my name below, I, Arianna Nassar and Charolotte Eke, attest that this documentation has been prepared under the direction and in the presence of Orpah Greek, MD.  Electronically Signed: Julien Nordmann, ED Scribe. 09/28/16. 11:32 PM.    History   Chief Complaint Chief Complaint  Patient presents with  . Back Pain    HPI HPI Comments: [name] is a 82 y.o. female with h/o of Breast Cancer ([place]), HTN, Osteoporosis, who presents to the Emergency Department complaining of gradual onset, constant right shoulder blade pain x this afternoon. pt states the pain radiates into the right side of her neck, right shoulder and down her right arm.  Pt reports that she awoke 1 day last week from similar pain but reports that the pain alleviated. Movement of her right arm exacerbates her pain. The patient has take Ibuprofen for pain without relief. Pt has no known drug allergies. There are no known injury or falls to the area. There are no other complaints.      The history is provided by the patient. No language interpreter was used.    Past Medical History:  Diagnosis Date  . Autoimmune disease ([place])   . Barrett's esophagus   . Bone lesion    sacrum  . Breast cancer ([place]) 2010   cT1 N0 M0; ER+/ PR+/ HER2 neg; s/p lumpectomy 07/2010; started adjuvant hormonal therapy 10/2009  . Cervical cancer ([place])   . Dysphagia   . Fracture of left shoulder   . Fx distal radius NEC-closed   . Glaucoma   . Humerus fracture 03/2012   impacted left humueral neck fracture, treated by Dr. Amedeo Plenty  . Hyperlipidemia   . Hypertension   . Neuromuscular disorder ([place])   . Osteopenia   . Osteoporosis   . Osteoporosis 03/18/2012  . Pneumonia     Patient Active Problem List   Diagnosis Date Noted  . Hoarseness of voice 02/28/2016  . Raynaud's phenomenon 02/28/2016  . Esophageal spasm 09/20/2013    . Trigger finger 07/08/2012  . Osteoporosis 03/18/2012  . Fracture, shoulder 03/06/2012  . Hyperlipidemia   . Breast cancer [facility])     Past Surgical History:  Procedure Laterality Date  . ABDOMINAL HYSTERECTOMY  1972   cancer          1 tube 1 ovary  . APPENDECTOMY    . BREAST LUMPECTOMY    . colon polyp removal    . EYE SURGERY    . HAND SURGERY    . ortho procedures     multiple  . OVARIAN CYST REMOVAL  1978  . [place]   left    OB History    No data available       Home Medications    Prior to Admission medications   Medication Sig Start Date End Date Taking? Authorizing Provider  alendronate (FOSAMAX) 70 MG tablet Take 1 tablet (70 mg total) by mouth once a week. Take with a full glass of water on an empty stomach. 08/31/16   Darreld Mclean, MD  Calcium Carbonate-Vitamin D (CALCIUM + D PO) Take 1 capsule by mouth 3 (three) times daily. Taking 2 pills per day    Historical Provider, MD  latanoprost (XALATAN) 0.005 % ophthalmic solution 1 drop at bedtime.    Historical Provider, MD  Multiple Vitamin (MULTIVITAMIN) capsule Take 1 capsule by  mouth daily.    Historical Provider, MD  oxyCODONE-acetaminophen (PERCOCET) 5-325 MG tablet Take 0.5 tablets by mouth every 4 (four) hours as needed. 09/29/16   Orpah Greek, MD  rosuvastatin (CRESTOR) 10 MG tablet TAKE 1 BY MOUTH DAILY 02/28/16   Darlyne Russian, MD    Family History Family History  Problem Relation Age of Onset  . Heart disease Father   . Cancer Sister     lung, kidney  . Cancer Brother     brain  . Cancer Paternal Aunt     breast  . Cancer Brother     liver, lung  . Osteoporosis Neg Hx     Social History Social History  Substance Use Topics  . Smoking status: Former Smoker    Quit date: 12/16/1986  . Smokeless tobacco: Never Used     Comment: FORMER SMOKER 30 YEARS AGO  . Alcohol use 0.0 oz/week     Comment: OCCASIONALLY     Allergies   Daypro [oxaprozin]   Review  of Systems Review of Systems  Constitutional: Negative for fever.  Musculoskeletal: Positive for arthralgias and back pain.  All other systems reviewed and are negative.    Physical Exam Updated Vital Signs BP 121/65 (BP Location: Left Arm)   Pulse (!) 55   Temp 97.9 F (36.6 C) (Oral)   Resp 20   SpO2 98%   Physical Exam  Constitutional: She is oriented to person, place, and time. She appears well-developed and well-nourished. No distress.  HENT:  Head: Normocephalic and atraumatic.  Right Ear: Hearing normal.  Left Ear: Hearing normal.  Nose: Nose normal.  Mouth/Throat: Oropharynx is clear and moist and mucous membranes are normal.  Eyes: Conjunctivae and EOM are normal. Pupils are equal, round, and reactive to light.  Neck: Normal range of motion. Neck supple.  Cardiovascular: Regular rhythm, S1 normal and S2 normal.  Exam reveals no gallop and no friction rub.   No murmur heard. Pulmonary/Chest: Effort normal and breath sounds normal. No respiratory distress. She exhibits no tenderness.  Abdominal: Soft. Normal appearance and bowel sounds are normal. There is no hepatosplenomegaly. There is no tenderness. There is no rebound, no guarding, no tenderness at McBurney's point and negative Murphy's sign. No hernia.  Musculoskeletal: She exhibits tenderness. She exhibits no deformity.  Painful inhibition of ROM of right shoulder without deformity.  Neurological: She is alert and oriented to person, place, and time. She has normal strength. No cranial nerve deficit or sensory deficit. Coordination normal. GCS eye subscore is 4. GCS verbal subscore is 5. GCS motor subscore is 6.  Skin: Skin is warm, dry and intact. No rash noted. No cyanosis.  Psychiatric: She has a normal mood and affect. Her speech is normal and behavior is normal. Thought content normal.  Nursing note and vitals reviewed.    ED Treatments / Results   DIAGNOSTIC STUDIES: Oxygen Saturation is 100% on room air,  normal by my interpretation.  COORDINATION OF CARE: 11:21 PM Discussed treatment plan with pt at bedside and pt agreed to plan.   Labs (all labs ordered are listed, but only abnormal results are displayed) Labs Reviewed - No data to display  EKG  EKG Interpretation None       Radiology Dg Shoulder Right  Result Date: 09/29/2016 CLINICAL DATA:  Acute onset right shoulder pain EXAM: RIGHT SHOULDER - 2+ VIEW COMPARISON:  Right shoulder radiograph 02/05/2015 FINDINGS: There is deformity of the right humeral head related to remote  trauma. No acute displaced fracture. The glenohumeral joint remains approximated. IMPRESSION: Deformity of the humeral head secondary to remote trauma, without evidence of acute fracture. Electronically Signed   By: Ulyses Jarred M.D.   On: 09/29/2016 01:10    Procedures Procedures (including critical care time)  Medications Ordered in ED Medications  oxyCODONE-acetaminophen (PERCOCET/ROXICET) 5-325 MG per tablet 0.5 tablet (0.5 tablets Oral Given 09/28/16 2351)  ondansetron (ZOFRAN-ODT) disintegrating tablet 8 mg (8 mg Oral Given 09/28/16 2350)     Initial Impression / Assessment and Plan / ED Course  I have reviewed the triage vital signs and the nursing notes.  Pertinent labs & imaging results that were available during my care of the patient were reviewed by me and considered in my medical decision making (see chart for details).  Clinical Course   Patient having severe spasms of the paraspinal muscles and posterior shoulder muscles with movement of the right shoulder. There was no injury. Patient is comfortable at rest but has sharp stabbing pains with any active movement. She does have, however, full range of motion. No concern for rotator cuff injury. X-ray unremarkable. Patient's pain is resolved after 0.5 Percocet tablets. Recommend rest and continue analgesia, follow up with primary doctor.  Final Clinical Impressions(s) / ED Diagnoses    Final diagnoses:  Muscle spasm of back   I personally performed the services described in this documentation, which was scribed in my presence. The recorded information has been reviewed and is accurate.   New Prescriptions New Prescriptions   OXYCODONE-ACETAMINOPHEN (PERCOCET) 5-325 MG TABLET    Take 0.5 tablets by mouth every 4 (four) hours as needed.     Orpah Greek, MD 09/29/16 [phone]

## 2016-09-28 NOTE — ED Notes (Signed)
ED Provider at bedside. 

## 2016-09-28 NOTE — ED Notes (Signed)
Patient transported to X-ray 

## 2016-09-28 NOTE — ED Notes (Signed)
Pt describes onset of pain under right shoulder blade at 5p this evening. Pain radiated down right arm and up into neck. Radiated down right leg at one point. Brief period of nausea, but denies other s/s. Took Ibuprofen x 2 without relief. Pain increased with movement. "Better after husband massaged it."

## 2016-09-28 NOTE — ED Triage Notes (Signed)
Patient states that she is having spasms in her right shoulder and down her arm since this afternoon. The patient reports that she has increased pain with a deep breath

## 2016-09-29 MED ORDER — OXYCODONE-ACETAMINOPHEN 5-325 MG PO TABS: 0.5000 | ORAL_TABLET | ORAL | 0 refills | Status: DC | PRN

## 2016-09-29 NOTE — ED Notes (Signed)
Pt given d/c instructions as per chart. Verbalizes understanding. No questions. Rx x 1 with precautions

## 2016-11-11 DIAGNOSIS — Z85828 Personal history of other malignant neoplasm of skin: Secondary | ICD-10-CM | POA: Diagnosis not present

## 2016-11-11 DIAGNOSIS — L720 Epidermal cyst: Secondary | ICD-10-CM | POA: Diagnosis not present

## 2016-11-11 DIAGNOSIS — L814 Other melanin hyperpigmentation: Secondary | ICD-10-CM | POA: Diagnosis not present

## 2016-11-11 DIAGNOSIS — C44319 Basal cell carcinoma of skin of other parts of face: Secondary | ICD-10-CM | POA: Diagnosis not present

## 2016-11-11 DIAGNOSIS — L218 Other seborrheic dermatitis: Secondary | ICD-10-CM | POA: Diagnosis not present

## 2016-11-11 DIAGNOSIS — L821 Other seborrheic keratosis: Secondary | ICD-10-CM | POA: Diagnosis not present

## 2016-11-11 DIAGNOSIS — D485 Neoplasm of uncertain behavior of skin: Secondary | ICD-10-CM | POA: Diagnosis not present

## 2016-11-11 DIAGNOSIS — D224 Melanocytic nevi of scalp and neck: Secondary | ICD-10-CM | POA: Diagnosis not present

## 2016-11-11 DIAGNOSIS — D225 Melanocytic nevi of trunk: Secondary | ICD-10-CM | POA: Diagnosis not present

## 2016-11-11 DIAGNOSIS — C44519 Basal cell carcinoma of skin of other part of trunk: Secondary | ICD-10-CM | POA: Diagnosis not present

## 2016-11-11 DIAGNOSIS — L570 Actinic keratosis: Secondary | ICD-10-CM | POA: Diagnosis not present

## 2016-11-28 DIAGNOSIS — C44519 Basal cell carcinoma of skin of other part of trunk: Secondary | ICD-10-CM | POA: Diagnosis not present

## 2016-11-28 DIAGNOSIS — C44319 Basal cell carcinoma of skin of other parts of face: Secondary | ICD-10-CM | POA: Diagnosis not present

## 2016-11-28 DIAGNOSIS — Z85828 Personal history of other malignant neoplasm of skin: Secondary | ICD-10-CM | POA: Diagnosis not present

## 2017-02-06 DIAGNOSIS — H00012 Hordeolum externum right lower eyelid: Secondary | ICD-10-CM | POA: Diagnosis not present

## 2017-02-20 DIAGNOSIS — H02055 Trichiasis without entropian left lower eyelid: Secondary | ICD-10-CM | POA: Diagnosis not present

## 2017-02-20 DIAGNOSIS — H02052 Trichiasis without entropian right lower eyelid: Secondary | ICD-10-CM | POA: Diagnosis not present

## 2017-02-20 DIAGNOSIS — H401133 Primary open-angle glaucoma, bilateral, severe stage: Secondary | ICD-10-CM | POA: Diagnosis not present

## 2017-03-01 NOTE — Progress Notes (Signed)
[place] at [facility] [address], [place], [place] [address] [phone]  Date:  03/02/2017   Name:  [name]   DOB:  Apr 29, 1931   MRN:  [id number]  PCP:  Darreld Mclean, MD    Chief Complaint: Follow-up (Pt here for 6 month follow up visit. No new concerns at this time. )   History of Present Illness:  [name] is a 83 y.o. very pleasant female patient who presents with the following:  Last seen by myself in November- here today for a follow-up visit History of osteoporosis, dyslipidemia, breast cancer dx 2010 She is feeling good, her energy level is fine.   She has a good appetite- eating normally  Wt Readings from Last 3 Encounters:  03/02/17 108 lb 3.2 oz (49.1 kg)  08/27/16 111 lb (50.3 kg)  02/28/16 108 lb 9.6 oz (49.3 kg)   BP Readings from Last 3 Encounters:  03/02/17 100/70  09/29/16 121/65  08/27/16 (!) 142/80   She does walk quite a bit- she is very active and enjoys walking at various parks in the area. She may walk 5-7 miles over the course of a day.   No CP or SOB with her walking Her husband is seeing me at 1pm- he is having some shoulder problems Both of them are still quite independent and able to drive.     She decided against fosamax at this time - her most recent bone density was borderline with density score of -2.1 but 6% 1 year hip fracture risk. However she prefers to continue exercise and calcium instead of treatment which is certainly her choice   She had just a pop- tart this am  She did have the zostavax but has not yet had shingrix Tetanus shot is over 72 years old- she did cut her dorsal left hand getting out of her car a few weeks ago- it seems to have healed ok. No deficit in her hand function She would like to boost her tetanus today   Patient Active Problem List   Diagnosis Date Noted  . Hoarseness of voice 02/28/2016  . Raynaud's phenomenon 02/28/2016  . Esophageal  spasm 09/20/2013  . Trigger finger 07/08/2012  . Osteoporosis 03/18/2012  . Fracture, shoulder 03/06/2012  . Hyperlipidemia   . Breast cancer [facility])     Past Medical History:  Diagnosis Date  . Autoimmune disease ([place])   . Barrett's esophagus   . Bone lesion    sacrum  . Breast cancer ([place]) 2010   cT1 N0 M0; ER+/ PR+/ HER2 neg; s/p lumpectomy 07/2010; started adjuvant hormonal therapy 10/2009  . Cervical cancer ([place])   . Dysphagia   . Fracture of left shoulder   . Fx distal radius NEC-closed   . Glaucoma   . Humerus fracture 03/2012   impacted left humueral neck fracture, treated by Dr. Amedeo Plenty  . Hyperlipidemia   . Hypertension   . Neuromuscular disorder ([place])   . Osteopenia   . Osteoporosis   . Osteoporosis 03/18/2012  . Pneumonia     Past Surgical History:  Procedure Laterality Date  . ABDOMINAL HYSTERECTOMY  1972   cancer          1 tube 1 ovary  . APPENDECTOMY    . BREAST LUMPECTOMY    . colon polyp removal    . EYE SURGERY    . HAND SURGERY    . ortho procedures  multiple  . OVARIAN CYST REMOVAL  1978  . [place]   left    Social History  Substance Use Topics  . Smoking status: Former Smoker    Quit date: 12/16/1986  . Smokeless tobacco: Never Used     Comment: FORMER SMOKER 30 YEARS AGO  . Alcohol use 0.0 oz/week     Comment: OCCASIONALLY    Family History  Problem Relation Age of Onset  . Heart disease Father   . Cancer Sister        lung, kidney  . Cancer Brother        brain  . Cancer Paternal Aunt        breast  . Cancer Brother        liver, lung  . Osteoporosis Neg Hx     Allergies  Allergen Reactions  . Daypro [Oxaprozin] Swelling    Face and tongue    Medication list has been reviewed and updated.  Current Outpatient Prescriptions on File Prior to Visit  Medication Sig Dispense Refill  . alendronate (FOSAMAX) 70 MG tablet Take 1 tablet (70 mg total) by mouth once a week. Take with a full glass of water on an  empty stomach. 12 tablet 3  . Calcium Carbonate-Vitamin D (CALCIUM + D PO) Take 1 capsule by mouth 3 (three) times daily. Taking 2 pills per day    . latanoprost (XALATAN) 0.005 % ophthalmic solution 1 drop at bedtime.    . Multiple Vitamin (MULTIVITAMIN) capsule Take 1 capsule by mouth daily.    Marland Kitchen oxyCODONE-acetaminophen (PERCOCET) 5-325 MG tablet Take 0.5 tablets by mouth every 4 (four) hours as needed. 5 tablet 0  . rosuvastatin (CRESTOR) 10 MG tablet TAKE 1 BY MOUTH DAILY 90 tablet 3   No current facility-administered medications on file prior to visit.     Review of Systems:  As per HPI- otherwise negative. She notes good appettie, good energy level and good spirits No nausea, vomiting, diarrhea, rash, CP or SOB   Physical Examination: Vitals:   03/02/17 1021  BP: 100/70  Pulse: 76  Temp: 97.6 F (36.4 C)   Vitals:   03/02/17 1021  Weight: 108 lb 3.2 oz (49.1 kg)  Height: _0  (1.575 m)   Body mass index is 19.79 kg/m. Ideal Body Weight: Weight in (lb) to have BMI = 25: 136.4  GEN: WDWN, NAD, Non-toxic, A & O x 3, slim build, looks well HEENT: Atraumatic, Normocephalic. Neck supple. No masses, No LAD.  Bilateral TM wnl, oropharynx normal.  PEERL,EOMI.   Ears and Nose: No external deformity. CV: RRR, No M/G/R. No JVD. No thrill. No extra heart sounds. PULM: CTA B, no wheezes, crackles, rhonchi. No retractions. No resp. distress. No accessory muscle use. ABD: S, NT, ND EXTR: No c/c/e NEURO Normal gait.  PSYCH: Normally interactive. Conversant. Not depressed or anxious appearing.  Calm demeanor.  Wound on dorsal left hand has healed completely.  Normal strength and ROM of the hand    Assessment and Plan: Mixed hyperlipidemia - Plan: Lipid panel, rosuvastatin (CRESTOR) 10 MG tablet  Age-related osteoporosis without current pathological fracture  Iron deficiency anemia, unspecified iron deficiency anemia type - Plan: CBC  Medication monitoring encounter - Plan:  CBC, Comprehensive metabolic panel  Laceration of left hand without foreign body, initial encounter - Plan: Td vaccine greater than or equal to 7yo preservative free IM  Here today for a follow-up viist Refilled her crestor, check labs today Encouraged continued  exercise and also encouraged her to eat enough- and try to gain a few lbs- to support her bone density Updated her tetanus shot today Gave hand- out regarding shingrix today Will plan further follow- up pending labs.   Signed Lamar Blinks, MD

## 2017-03-02 ENCOUNTER — Ambulatory Visit (INDEPENDENT_AMBULATORY_CARE_PROVIDER_SITE_OTHER): Payer: Medicare Other | Admitting: Family Medicine

## 2017-03-02 ENCOUNTER — Encounter: Payer: Self-pay | Admitting: Family Medicine

## 2017-03-02 VITALS — BP 100/70 | HR 76 | Temp 97.6°F | Ht 62.0 in | Wt 108.2 lb

## 2017-03-02 DIAGNOSIS — Z23 Encounter for immunization: Secondary | ICD-10-CM | POA: Diagnosis not present

## 2017-03-02 DIAGNOSIS — D509 Iron deficiency anemia, unspecified: Secondary | ICD-10-CM

## 2017-03-02 DIAGNOSIS — M810 Age-related osteoporosis without current pathological fracture: Secondary | ICD-10-CM

## 2017-03-02 DIAGNOSIS — E782 Mixed hyperlipidemia: Secondary | ICD-10-CM | POA: Diagnosis not present

## 2017-03-02 DIAGNOSIS — S61412A Laceration without foreign body of left hand, initial encounter: Secondary | ICD-10-CM | POA: Diagnosis not present

## 2017-03-02 DIAGNOSIS — Z5181 Encounter for therapeutic drug level monitoring: Secondary | ICD-10-CM | POA: Diagnosis not present

## 2017-03-02 LAB — LIPID PANEL
Cholesterol: 163 mg/dL (ref 0–200)
HDL: 70.3 mg/dL (ref >39.00)
LDL Cholesterol: 81 mg/dL (ref 0–99)
NonHDL: 92.92
Total CHOL/HDL Ratio: 2
Triglycerides: 58 mg/dL (ref 0.0–149.0)
VLDL: 11.6 mg/dL (ref 0.0–40.0)

## 2017-03-02 LAB — CBC
HCT: 38.6 % (ref 36.0–46.0)
Hemoglobin: 12.7 g/dL (ref 12.0–15.0)
MCHC: 32.8 g/dL (ref 30.0–36.0)
MCV: 90.9 fl (ref 78.0–100.0)
Platelets: 216 10*3/uL (ref 150.0–400.0)
RBC: 4.25 Mil/uL (ref 3.87–5.11)
RDW: 16.1 % — ABNORMAL HIGH (ref 11.5–15.5)
WBC: 6.5 10*3/uL (ref 4.0–10.5)

## 2017-03-02 LAB — COMPREHENSIVE METABOLIC PANEL
ALT: 18 U/L (ref 0–35)
AST: 30 U/L (ref 0–37)
Albumin: 4.1 g/dL (ref 3.5–5.2)
Alkaline Phosphatase: 71 U/L (ref 39–117)
BUN: 21 mg/dL (ref 6–23)
CO2: 30 mEq/L (ref 19–32)
Calcium: 10 mg/dL (ref 8.4–10.5)
Chloride: 103 mEq/L (ref 96–112)
Creatinine, Ser: 0.97 mg/dL (ref 0.40–1.20)
GFR: 58.17 mL/min — ABNORMAL LOW (ref >60.00)
Glucose, Bld: 78 mg/dL (ref 70–99)
Potassium: 4.4 mEq/L (ref 3.5–5.1)
Sodium: 138 mEq/L (ref 135–145)
Total Bilirubin: 0.4 mg/dL (ref 0.2–1.2)
Total Protein: 7 g/dL (ref 6.0–8.3)

## 2017-03-02 MED ORDER — ROSUVASTATIN CALCIUM 10 MG PO TABS: ORAL_TABLET | ORAL | 3 refills | Status: DC

## 2017-03-02 NOTE — Patient Instructions (Addendum)
It was a pleasure to see you today as always!  Have a wonderful spring and summer I will be in touch with your labs asap  We refilled your crestor to the mail away pharmacy for one year You might call your insurance company and ask about Shingrix- see how much of the cost they cover. If you like, this vaccine can be given at most major drug stores.   You got your tetanus shot today- this is good for 10 years   Take care and let's visit in 6 months

## 2017-05-07 DIAGNOSIS — Z85828 Personal history of other malignant neoplasm of skin: Secondary | ICD-10-CM | POA: Diagnosis not present

## 2017-05-07 DIAGNOSIS — C44729 Squamous cell carcinoma of skin of left lower limb, including hip: Secondary | ICD-10-CM | POA: Diagnosis not present

## 2017-05-07 DIAGNOSIS — D485 Neoplasm of uncertain behavior of skin: Secondary | ICD-10-CM | POA: Diagnosis not present

## 2017-05-20 DIAGNOSIS — Z85828 Personal history of other malignant neoplasm of skin: Secondary | ICD-10-CM | POA: Diagnosis not present

## 2017-05-20 DIAGNOSIS — C44729 Squamous cell carcinoma of skin of left lower limb, including hip: Secondary | ICD-10-CM | POA: Diagnosis not present

## 2017-07-02 DIAGNOSIS — L820 Inflamed seborrheic keratosis: Secondary | ICD-10-CM | POA: Diagnosis not present

## 2017-07-02 DIAGNOSIS — Z85828 Personal history of other malignant neoplasm of skin: Secondary | ICD-10-CM | POA: Diagnosis not present

## 2017-07-16 ENCOUNTER — Other Ambulatory Visit: Payer: Self-pay | Admitting: Family Medicine

## 2017-07-16 DIAGNOSIS — Z853 Personal history of malignant neoplasm of breast: Secondary | ICD-10-CM

## 2017-07-16 DIAGNOSIS — N6320 Unspecified lump in the left breast, unspecified quadrant: Secondary | ICD-10-CM

## 2017-08-14 DIAGNOSIS — Z961 Presence of intraocular lens: Secondary | ICD-10-CM | POA: Diagnosis not present

## 2017-08-14 DIAGNOSIS — H401133 Primary open-angle glaucoma, bilateral, severe stage: Secondary | ICD-10-CM | POA: Diagnosis not present

## 2017-08-14 DIAGNOSIS — H01001 Unspecified blepharitis right upper eyelid: Secondary | ICD-10-CM | POA: Diagnosis not present

## 2017-08-14 DIAGNOSIS — H01002 Unspecified blepharitis right lower eyelid: Secondary | ICD-10-CM | POA: Diagnosis not present

## 2017-08-14 NOTE — Progress Notes (Signed)
[place] at [facility] [address], [place], [place] [address] [phone]  Date:  08/17/2017   Name:  [name]   DOB:  [birth year]   MRN:  [id number]  PCP:  Darreld Mclean, MD    Chief Complaint: Follow-up (Pt here for 6 month follow up ) and Hyperlipidemia   History of Present Illness:  [name] is a 83 y.o. very pleasant female patient who presents with the following:  History of breast cancer, hyperlipidemia on crestor Here today for a follow-up visit. We did her labs in May and all looked great- she prefers to defer BW today  Last seen here in May of this year:  History of osteoporosis, dyslipidemia, breast cancer dx 2010 She is feeling good, her energy level is fine.   She has a good appetite- eating normally     Wt Readings from Last 3 Encounters:  03/02/17 108 lb 3.2 oz (49.1 kg)  08/27/16 111 lb (50.3 kg)  02/28/16 108 lb 9.6 oz (49.3 kg)      BP Readings from Last 3 Encounters:  03/02/17 100/70  09/29/16 121/65  08/27/16 (!) 142/80   She does walk quite a bit- she is very active and enjoys walking at various parks in the area. She may walk 5-7 miles over the course of a day.   No CP or SOB with her walking Her husband is seeing me at 1pm- he is having some shoulder problems Both of them are still quite independent and able to drive.     She decided against fosamax at this time - her most recent bone density was borderline with density score of -2.1 but 6% 1 year hip fracture risk. However she prefers to continue exercise and calcium instead of treatment which is certainly her choice   Flu: will do today Last labs: May, all looked good Would like an rx for Shingrix which I will provide Lipids:    Component Value Date/Time   CHOL 163 03/02/2017 1054   TRIG 58.0 03/02/2017 1054   HDL 70.30 03/02/2017 1054   VLDL 11.6 03/02/2017 1054   CHOLHDL 2 03/02/2017 1054   A few years ago when  she had breast cancer, she had a ?PET scan and had a brain mass.  She then had an MRI/ MRA brain done at [facility] in 2010 that showed the following:   1. No acute intracranial process, infarct, mass or hemorrhage. 2. Mild chronic microvascular ischemic changes. 3. Small meningioma along the superior aspect of the left tentorium. 4. Unremarkable MRA of the brain. 5. Irregularity at the level of bilateral carotid bifurcations, resulting in approximately 40% narrowing of the proximal right internal carotid artery, 20% stenosis of the distal right common carotid artery, 15% stenosis of the proximal left internal carotid artery and 25% stenosis of the distal left common carotid artery. 55% stenosis of the proximal left external carotid artery.  She would like to have an MRI of her brain and carotid US which is fine- would like to eval for any progression of her carotid stenosis, and to follow-up on brain meningioma.  Called radiology- prefer MRI with and without contrast to look at this again   Never had a stroke or mini- stroke No unusual headaches  She tends to walk several miles a day- she recently did 7 miles in one day and was a bit sore! She is able to exercise generally with no  issues and is in excellent shape for her age  Patient Active Problem List   Diagnosis Date Noted  . Hoarseness of voice 02/28/2016  . Raynaud's phenomenon 02/28/2016  . Esophageal spasm 09/20/2013  . Trigger finger 07/08/2012  . Osteoporosis 03/18/2012  . Fracture, shoulder 03/06/2012  . Hyperlipidemia   . Breast cancer [facility])     Past Medical History:  Diagnosis Date  . Autoimmune disease ([place])   . Barrett's esophagus   . Bone lesion    sacrum  . Breast cancer ([place]) 2010   cT1 N0 M0; ER+/ PR+/ HER2 neg; s/p lumpectomy 07/2010; started adjuvant hormonal therapy 10/2009  . Cervical cancer ([place])   . Dysphagia   . Fracture of left shoulder   . Fx distal radius NEC-closed   . Glaucoma   . Humerus fracture  03/2012   impacted left humueral neck fracture, treated by Dr. Amedeo Plenty  . Hyperlipidemia   . Hypertension   . Neuromuscular disorder ([place])   . Osteopenia   . Osteoporosis   . Osteoporosis 03/18/2012  . Pneumonia     Past Surgical History:  Procedure Laterality Date  . ABDOMINAL HYSTERECTOMY  1972   cancer          1 tube 1 ovary  . APPENDECTOMY    . BREAST LUMPECTOMY    . colon polyp removal    . EYE SURGERY    . HAND SURGERY    . ortho procedures     multiple  . OVARIAN CYST REMOVAL  1978  . SHOULDER SURGERY  1954   left    Social History   Tobacco Use  . Smoking status: Former Smoker    Last attempt to quit: 12/16/1986    Years since quitting: 30.6  . Smokeless tobacco: Never Used  . Tobacco comment: FORMER SMOKER 30 YEARS AGO  Substance Use Topics  . Alcohol use: Yes    Alcohol/week: 0.0 oz    Comment: OCCASIONALLY  . Drug use: No    Family History  Problem Relation Age of Onset  . Heart disease Father   . Cancer Sister        lung, kidney  . Cancer Brother        brain  . Cancer Paternal Aunt        breast  . Cancer Brother        liver, lung  . Osteoporosis Neg Hx     Allergies  Allergen Reactions  . Daypro [Oxaprozin] Swelling    Face and tongue    Medication list has been reviewed and updated.  Current Outpatient Medications on File Prior to Visit  Medication Sig Dispense Refill  . Calcium Carbonate-Vitamin D (CALCIUM + D PO) Take 1 capsule by mouth 3 (three) times daily. Taking 2 pills per day    . latanoprost (XALATAN) 0.005 % ophthalmic solution 1 drop at bedtime.    . Multiple Vitamin (MULTIVITAMIN) capsule Take 1 capsule by mouth daily.    . rosuvastatin (CRESTOR) 10 MG tablet TAKE 1 BY MOUTH DAILY 90 tablet 3   No current facility-administered medications on file prior to visit.     Review of Systems:  As per HPI- otherwise negative. No fever or chills No CP, SOB or palpitations noted   Physical Examination: Vitals:   08/17/17  0906 08/17/17 0941  BP: (!) 158/76 140/85  Pulse: (!) 57   Resp: 16   Temp: 97.6 F (36.4 C)   SpO2: 100%    Vitals:  08/17/17 0906  Weight: 109 lb 3.2 oz (49.5 kg)   Body mass index is 19.97 kg/m. Ideal Body Weight:    GEN: WDWN, NAD, Non-toxic, A & O x 3, slim build, looks well and younger than age 48: Atraumatic, Normocephalic. Neck supple. No masses, No LAD. Ears and Nose: No external deformity. CV: RRR, No M/G/R. No JVD. No thrill. No extra heart sounds. PULM: CTA B, no wheezes, crackles, rhonchi. No retractions. No resp. distress. No accessory muscle use. ABD: S, NT, ND, +BS. No rebound. No HSM. EXTR: No c/c/e NEURO Normal gait.  PSYCH: Normally interactive. Conversant. Not depressed or anxious appearing.  Calm demeanor.   EKG: SR with ectopic beats, not in fib or flutter however Assessment and Plan: Mixed hyperlipidemia  Irregularly irregular pulse rhythm - Plan: EKG 12-Lead  Bilateral carotid artery stenosis - Plan: US Carotid Duplex Bilateral  H/O meningioma of the brain - Plan: MR Brain W Wo Contrast  Follow-up visit today Continue crestor for her hyperlipidemia She was noted to have carotid stenosis on an MRA head several years ago- will get carotid US for her and also will follow-up on brain meningioma with an MRI  Will plan further follow- up pending labs.   Signed Lamar Blinks, MD

## 2017-08-17 ENCOUNTER — Encounter: Payer: Self-pay | Admitting: Family Medicine

## 2017-08-17 ENCOUNTER — Ambulatory Visit: Payer: Medicare Other | Admitting: Family Medicine

## 2017-08-17 VITALS — BP 140/85 | HR 57 | Temp 97.6°F | Resp 16 | Wt 109.2 lb

## 2017-08-17 DIAGNOSIS — I6523 Occlusion and stenosis of bilateral carotid arteries: Secondary | ICD-10-CM | POA: Diagnosis not present

## 2017-08-17 DIAGNOSIS — Z86011 Personal history of benign neoplasm of the brain: Secondary | ICD-10-CM | POA: Diagnosis not present

## 2017-08-17 DIAGNOSIS — I499 Cardiac arrhythmia, unspecified: Secondary | ICD-10-CM | POA: Diagnosis not present

## 2017-08-17 DIAGNOSIS — E782 Mixed hyperlipidemia: Secondary | ICD-10-CM | POA: Diagnosis not present

## 2017-08-17 NOTE — Patient Instructions (Addendum)
It was nice to see you again today! I will set up an MRI of your brain and carotid artery ultrasound You can get the shingrix vaccine at your convenience

## 2017-08-19 ENCOUNTER — Telehealth: Payer: Self-pay | Admitting: Family Medicine

## 2017-08-19 ENCOUNTER — Ambulatory Visit (HOSPITAL_BASED_OUTPATIENT_CLINIC_OR_DEPARTMENT_OTHER)
Admission: RE | Admit: 2017-08-19 | Discharge: 2017-08-19 | Disposition: A | Discharge status: Home / Self Care | Payer: Medicare Other | Source: Ambulatory Visit | Attending: Family Medicine | Admitting: Family Medicine

## 2017-08-19 DIAGNOSIS — I6523 Occlusion and stenosis of bilateral carotid arteries: Secondary | ICD-10-CM | POA: Diagnosis not present

## 2017-08-19 NOTE — Telephone Encounter (Signed)
Pt's daughter called in to follow up to see if results were received from imaging. She would like a call back directly.    CB: [phone]

## 2017-08-19 NOTE — Telephone Encounter (Signed)
[name] [phone] called states she is on hippa & pt wants [place] to call [name] to give results from imaging scan done today.

## 2017-08-19 NOTE — Telephone Encounter (Addendum)
Daughter [name] called again checking status of US CAROTID DUPLEX.  Please call today.

## 2017-08-20 ENCOUNTER — Encounter: Payer: Self-pay | Admitting: Family Medicine

## 2017-08-20 ENCOUNTER — Telehealth: Payer: Self-pay | Admitting: Family Medicine

## 2017-08-20 ENCOUNTER — Other Ambulatory Visit (INDEPENDENT_AMBULATORY_CARE_PROVIDER_SITE_OTHER): Payer: Medicare Other

## 2017-08-20 DIAGNOSIS — Z5181 Encounter for therapeutic drug level monitoring: Secondary | ICD-10-CM | POA: Diagnosis not present

## 2017-08-20 LAB — BASIC METABOLIC PANEL
BUN/Creatinine Ratio: 19 (calc) (ref 6–22)
BUN: 21 mg/dL (ref 7–25)
CO2: 29 mmol/L (ref 20–32)
Calcium: 9.9 mg/dL (ref 8.6–10.4)
Chloride: 106 mmol/L (ref 98–110)
Creat: 1.1 mg/dL — ABNORMAL HIGH (ref 0.60–0.88)
Glucose, Bld: 90 mg/dL (ref 65–99)
Potassium: 5 mmol/L (ref 3.5–5.3)
Sodium: 142 mmol/L (ref 135–146)

## 2017-08-20 NOTE — Telephone Encounter (Signed)
-----   Message from Synthia Innocent sent at 08/20/2017  2:20 PM EST ----- Regarding: FW: mr brain Please see below. Thanks ----- Message ----- From: Katha Hamming Sent: 08/20/2017   1:30 PM To: Synthia Innocent Subject: FW: mr brain                                    Can you please help to get these labs in for this patient.  She is scheduled for this Saturday for her MRI.     Thanks!  I am hoping to get the patient in tomorrow for the labs. ----- Message ----- From: Katha Hamming Sent: 08/20/2017   1:07 PM To: Hinton Dyer, CMA Subject: FW: mr brain                                     ----- Message ----- From: Katha Hamming Sent: 08/17/2017   2:34 PM To: Darreld Mclean, MD Subject: mr brain                                       Hey Dr. Lorelei Pont:  We have [place] scheduled for her MRI brain w/wo for this Saturday, 08/22/17.   She will need a creatinine ordered because she is getting the IV contrast.   I do not see a creatinine obtained in the last six weeks.      Thanks, Hoyle Sauer

## 2017-08-20 NOTE — Telephone Encounter (Signed)
Ordered

## 2017-08-20 NOTE — Telephone Encounter (Signed)
Received her carotid US- we did in response to a PET scan from 2010, which showed  Irregularity at the level of bilateral carotid bifurcations, resulting in approximately 40% narrowing of the proximal right internal carotid artery, 20% stenosis of the distal right common carotid artery, 15% stenosis of the proximal left internal carotid artery and 25% stenosis of the distal left common carotid artery. 55% stenosis of the proximal left external carotid artery  Carotid US  US Carotid Duplex Bilateral  Result Date: 08/19/2017 CLINICAL DATA:  83 year old female with a history of bilateral carotid stenosis. Cardiovascular risk factors include hypertension, hyperlipidemia, smoker EXAM: BILATERAL CAROTID DUPLEX ULTRASOUND TECHNIQUE: Pearline Cables scale imaging, color Doppler and duplex ultrasound were performed of bilateral carotid and vertebral arteries in the neck. COMPARISON:  No prior duplex FINDINGS: Criteria: Quantification of carotid stenosis is based on velocity parameters that correlate the residual internal carotid diameter with NASCET-based stenosis levels, using the diameter of the distal internal carotid lumen as the denominator for stenosis measurement. The following velocity measurements were obtained: RIGHT ICA:  Systolic 90 cm/sec, Diastolic 19 cm/sec CCA:  83 cm/sec SYSTOLIC ICA/CCA RATIO:  1.5 ECA:  60 cm/sec LEFT ICA:  Systolic 93 cm/sec, Diastolic 25 cm/sec CCA:  73 cm/sec SYSTOLIC ICA/CCA RATIO:  1.3 ECA:  118 cm/sec Right Brachial SBP: 164 Left Brachial SBP: 166 RIGHT CAROTID ARTERY: No significant calcifications of the right common carotid artery. Intermediate waveform maintained. Heterogeneous and partially calcified plaque at the right carotid bifurcation. Presence of luminal shadowing. Low resistance waveform of the right ICA. No significant tortuosity. RIGHT VERTEBRAL ARTERY: Antegrade flow with low resistance waveform. LEFT CAROTID ARTERY: No significant calcifications of the left common  carotid artery. Intermediate waveform maintained. Heterogeneous and partially calcified plaque at the left carotid bifurcation with presence of lumen shadowing. Low resistance waveform of the left ICA. No significant tortuosity. LEFT VERTEBRAL ARTERY:  Antegrade flow with low resistance waveform. IMPRESSION: Color duplex indicates moderate heterogeneous and calcified plaque, with no hemodynamically significant stenosis by duplex criteria in the extracranial cerebrovascular circulation. Signed, Dulcy Fanny. Earleen Newport, DO [facility] Radiology Electronically Signed   By: Corrie Mckusick D.O.   On: 08/19/2017 15:03   Called her daughter and went over results- no significant stenosis as above.  Will continue to use crestor to control her cholesterol.  Also might consider an aspirin- will sent pt a letter about this

## 2017-08-21 ENCOUNTER — Ambulatory Visit
Admission: RE | Admit: 2017-08-21 | Discharge: 2017-08-21 | Disposition: A | Discharge status: Home / Self Care | Payer: Medicare Other | Source: Ambulatory Visit | Attending: Family Medicine | Admitting: Family Medicine

## 2017-08-21 ENCOUNTER — Other Ambulatory Visit: Payer: Self-pay | Admitting: Family Medicine

## 2017-08-21 DIAGNOSIS — Z853 Personal history of malignant neoplasm of breast: Secondary | ICD-10-CM

## 2017-08-21 DIAGNOSIS — N6320 Unspecified lump in the left breast, unspecified quadrant: Secondary | ICD-10-CM

## 2017-08-21 DIAGNOSIS — R928 Other abnormal and inconclusive findings on diagnostic imaging of breast: Secondary | ICD-10-CM | POA: Diagnosis not present

## 2017-08-21 DIAGNOSIS — N644 Mastodynia: Secondary | ICD-10-CM | POA: Diagnosis not present

## 2017-08-21 HISTORY — DX: Personal history of antineoplastic chemotherapy: Z92.21

## 2017-08-21 HISTORY — DX: Personal history of irradiation: Z92.3

## 2017-08-22 ENCOUNTER — Ambulatory Visit (HOSPITAL_BASED_OUTPATIENT_CLINIC_OR_DEPARTMENT_OTHER)
Admission: RE | Admit: 2017-08-22 | Discharge: 2017-08-22 | Disposition: A | Discharge status: Home / Self Care | Payer: Medicare Other | Source: Ambulatory Visit | Attending: Family Medicine | Admitting: Family Medicine

## 2017-08-22 DIAGNOSIS — Z86011 Personal history of benign neoplasm of the brain: Secondary | ICD-10-CM | POA: Insufficient documentation

## 2017-08-22 DIAGNOSIS — D329 Benign neoplasm of meninges, unspecified: Secondary | ICD-10-CM | POA: Diagnosis not present

## 2017-08-22 DIAGNOSIS — D320 Benign neoplasm of cerebral meninges: Secondary | ICD-10-CM | POA: Insufficient documentation

## 2017-08-22 DIAGNOSIS — I6782 Cerebral ischemia: Secondary | ICD-10-CM | POA: Insufficient documentation

## 2017-08-22 DIAGNOSIS — G319 Degenerative disease of nervous system, unspecified: Secondary | ICD-10-CM | POA: Insufficient documentation

## 2017-08-22 MED ORDER — GADOBENATE DIMEGLUMINE 529 MG/ML IV SOLN
10.0000 mL | Freq: Once | INTRAVENOUS | Status: AC | PRN
  Administered 2017-08-22: 10 mL via INTRAVENOUS

## 2017-08-27 ENCOUNTER — Telehealth: Payer: Self-pay | Admitting: Family Medicine

## 2017-08-27 DIAGNOSIS — Z86011 Personal history of benign neoplasm of the brain: Secondary | ICD-10-CM

## 2017-08-27 NOTE — Telephone Encounter (Signed)
Patient called and stts she had a MRIO on Saturday wants to know if PCP had any results back yet, Adv patient that a note will be submit up and a call will be provided. Please call patient at [phone].

## 2017-08-28 DIAGNOSIS — Z86011 Personal history of benign neoplasm of the brain: Secondary | ICD-10-CM | POA: Insufficient documentation

## 2017-08-28 HISTORY — DX: Personal history of benign neoplasm of the brain: Z86.011

## 2017-08-28 NOTE — Telephone Encounter (Signed)
Called her back to go over MRI results:  MRI HEAD WITHOUT AND WITH CONTRAST  TECHNIQUE: Multiplanar, multiecho pulse sequences of the brain and surrounding structures were obtained without and with intravenous contrast.  CONTRAST:  57mL MULTIHANCE GADOBENATE DIMEGLUMINE 529 MG/ML IV SOLN  COMPARISON:  CT head 08/24/2014  FINDINGS: Brain: Mild atrophy. Negative for hydrocephalus. Negative for acute infarct. Mild chronic microvascular ischemic change in the white matter.  7 mm calcification along the superior surface of the tentorium on the left with mild enhancement. This is compatible with a small meningioma. Calcification is unchanged from prior CTA 2015. No other enhancing mass lesion.  Vascular: Normal arterial flow voids Skull and upper cervical spine: Negative Sinuses/Orbits: Mild mucosal edema paranasal sinuses. Bilateral lens replacement  Other: None  IMPRESSION: Mild atrophy and mild chronic microvascular ischemia  7 mm calcified meningioma left tentorium unchanged 2015.

## 2017-09-08 ENCOUNTER — Inpatient Hospital Stay (HOSPITAL_BASED_OUTPATIENT_CLINIC_OR_DEPARTMENT_OTHER)
Admission: EM | Admit: 2017-09-08 | Discharge: 2017-09-10 | DRG: 392 | Disposition: A | Discharge status: Home / Self Care | Payer: Medicare Other | Attending: Family Medicine | Admitting: Family Medicine

## 2017-09-08 ENCOUNTER — Encounter (HOSPITAL_BASED_OUTPATIENT_CLINIC_OR_DEPARTMENT_OTHER): Payer: Self-pay | Admitting: *Deleted

## 2017-09-08 ENCOUNTER — Ambulatory Visit: Payer: Medicare Other | Admitting: Family

## 2017-09-08 ENCOUNTER — Encounter: Payer: Self-pay | Admitting: Family

## 2017-09-08 ENCOUNTER — Other Ambulatory Visit: Payer: Self-pay

## 2017-09-08 ENCOUNTER — Emergency Department (HOSPITAL_BASED_OUTPATIENT_CLINIC_OR_DEPARTMENT_OTHER): Payer: Medicare Other

## 2017-09-08 VITALS — BP 131/75 | HR 72 | Temp 97.5°F | Resp 16 | Ht 62.0 in | Wt 108.4 lb

## 2017-09-08 DIAGNOSIS — R112 Nausea with vomiting, unspecified: Secondary | ICD-10-CM

## 2017-09-08 DIAGNOSIS — R197 Diarrhea, unspecified: Secondary | ICD-10-CM | POA: Diagnosis not present

## 2017-09-08 DIAGNOSIS — Z9221 Personal history of antineoplastic chemotherapy: Secondary | ICD-10-CM | POA: Diagnosis not present

## 2017-09-08 DIAGNOSIS — R7989 Other specified abnormal findings of blood chemistry: Secondary | ICD-10-CM

## 2017-09-08 DIAGNOSIS — E860 Dehydration: Secondary | ICD-10-CM | POA: Diagnosis present

## 2017-09-08 DIAGNOSIS — I10 Essential (primary) hypertension: Secondary | ICD-10-CM | POA: Diagnosis not present

## 2017-09-08 DIAGNOSIS — R1110 Vomiting, unspecified: Secondary | ICD-10-CM | POA: Diagnosis not present

## 2017-09-08 DIAGNOSIS — R200 Anesthesia of skin: Secondary | ICD-10-CM

## 2017-09-08 DIAGNOSIS — H409 Unspecified glaucoma: Secondary | ICD-10-CM

## 2017-09-08 DIAGNOSIS — Z923 Personal history of irradiation: Secondary | ICD-10-CM

## 2017-09-08 DIAGNOSIS — Z888 Allergy status to other drugs, medicaments and biological substances status: Secondary | ICD-10-CM

## 2017-09-08 DIAGNOSIS — I493 Ventricular premature depolarization: Secondary | ICD-10-CM

## 2017-09-08 DIAGNOSIS — Z853 Personal history of malignant neoplasm of breast: Secondary | ICD-10-CM | POA: Diagnosis not present

## 2017-09-08 DIAGNOSIS — Z8541 Personal history of malignant neoplasm of cervix uteri: Secondary | ICD-10-CM | POA: Diagnosis not present

## 2017-09-08 DIAGNOSIS — Z803 Family history of malignant neoplasm of breast: Secondary | ICD-10-CM | POA: Diagnosis not present

## 2017-09-08 DIAGNOSIS — E785 Hyperlipidemia, unspecified: Secondary | ICD-10-CM | POA: Diagnosis not present

## 2017-09-08 DIAGNOSIS — M810 Age-related osteoporosis without current pathological fracture: Secondary | ICD-10-CM | POA: Diagnosis not present

## 2017-09-08 DIAGNOSIS — A09 Infectious gastroenteritis and colitis, unspecified: Principal | ICD-10-CM | POA: Diagnosis present

## 2017-09-08 DIAGNOSIS — R109 Unspecified abdominal pain: Secondary | ICD-10-CM

## 2017-09-08 DIAGNOSIS — Z87891 Personal history of nicotine dependence: Secondary | ICD-10-CM | POA: Diagnosis not present

## 2017-09-08 DIAGNOSIS — N179 Acute kidney failure, unspecified: Secondary | ICD-10-CM | POA: Diagnosis present

## 2017-09-08 DIAGNOSIS — Z90710 Acquired absence of both cervix and uterus: Secondary | ICD-10-CM

## 2017-09-08 DIAGNOSIS — Z8249 Family history of ischemic heart disease and other diseases of the circulatory system: Secondary | ICD-10-CM | POA: Diagnosis not present

## 2017-09-08 DIAGNOSIS — C50919 Malignant neoplasm of unspecified site of unspecified female breast: Secondary | ICD-10-CM | POA: Diagnosis present

## 2017-09-08 DIAGNOSIS — D329 Benign neoplasm of meninges, unspecified: Secondary | ICD-10-CM | POA: Diagnosis present

## 2017-09-08 HISTORY — DX: Diarrhea, unspecified: R19.7

## 2017-09-08 HISTORY — DX: Nausea with vomiting, unspecified: R11.2

## 2017-09-08 HISTORY — DX: Ventricular premature depolarization: I49.3

## 2017-09-08 HISTORY — DX: Other specified abnormal findings of blood chemistry: R79.89

## 2017-09-08 LAB — CBC WITH DIFFERENTIAL/PLATELET
Basophils Absolute: 0 10*3/uL (ref 0.0–0.1)
Basophils Relative: 0 %
Eosinophils Absolute: 0 10*3/uL (ref 0.0–0.7)
Eosinophils Relative: 1 %
HCT: 39.7 % (ref 36.0–46.0)
Hemoglobin: 12.9 g/dL (ref 12.0–15.0)
Lymphocytes Relative: 15 %
Lymphs Abs: 1.1 10*3/uL (ref 0.7–4.0)
MCH: 29.9 pg (ref 26.0–34.0)
MCHC: 32.5 g/dL (ref 30.0–36.0)
MCV: 91.9 fL (ref 78.0–100.0)
Monocytes Absolute: 0.8 10*3/uL (ref 0.1–1.0)
Monocytes Relative: 11 %
Neutro Abs: 5.2 10*3/uL (ref 1.7–7.7)
Neutrophils Relative %: 73 %
Platelets: 232 10*3/uL (ref 150–400)
RBC: 4.32 MIL/uL (ref 3.87–5.11)
RDW: 15.4 % (ref 11.5–15.5)
WBC: 7.1 10*3/uL (ref 4.0–10.5)

## 2017-09-08 LAB — COMPREHENSIVE METABOLIC PANEL
ALT: 23 U/L (ref 14–54)
AST: 31 U/L (ref 15–41)
Albumin: 3.9 g/dL (ref 3.5–5.0)
Alkaline Phosphatase: 81 U/L (ref 38–126)
Anion gap: 8 (ref 5–15)
BUN: 26 mg/dL — ABNORMAL HIGH (ref 6–20)
CO2: 23 mmol/L (ref 22–32)
Calcium: 8.8 mg/dL — ABNORMAL LOW (ref 8.9–10.3)
Chloride: 102 mmol/L (ref 101–111)
Creatinine, Ser: 1.19 mg/dL — ABNORMAL HIGH (ref 0.44–1.00)
GFR calc Af Amer: 47 mL/min — ABNORMAL LOW (ref >60)
GFR calc non Af Amer: 41 mL/min — ABNORMAL LOW (ref >60)
Glucose, Bld: 98 mg/dL (ref 65–99)
Potassium: 3.7 mmol/L (ref 3.5–5.1)
Sodium: 133 mmol/L — ABNORMAL LOW (ref 135–145)
Total Bilirubin: 0.4 mg/dL (ref 0.3–1.2)
Total Protein: 7.1 g/dL (ref 6.5–8.1)

## 2017-09-08 LAB — C DIFFICILE QUICK SCREEN W PCR REFLEX
C Diff antigen: NEGATIVE
C Diff interpretation: NOT DETECTED
C Diff toxin: NEGATIVE

## 2017-09-08 LAB — URINALYSIS, ROUTINE W REFLEX MICROSCOPIC
Bilirubin Urine: NEGATIVE
Glucose, UA: NEGATIVE mg/dL
Hgb urine dipstick: NEGATIVE
Ketones, ur: NEGATIVE mg/dL
Leukocytes, UA: NEGATIVE
Nitrite: NEGATIVE
Protein, ur: NEGATIVE mg/dL
Specific Gravity, Urine: <1.005 — ABNORMAL LOW (ref 1.005–1.030)
pH: 6 (ref 5.0–8.0)

## 2017-09-08 LAB — LIPASE, BLOOD: Lipase: 27 U/L (ref 11–51)

## 2017-09-08 MED ORDER — LATANOPROST 0.005 % OP SOLN
1.0000 [drp] | Freq: Every day | OPHTHALMIC | Status: DC
  Administered 2017-09-08 – 2017-09-09 (×2): 1 [drp] via OPHTHALMIC
  Filled 2017-09-08: qty 2.5

## 2017-09-08 MED ORDER — ACETAMINOPHEN 325 MG PO TABS: 650.0000 mg | ORAL_TABLET | Freq: Four times a day (QID) | ORAL | Status: DC | PRN

## 2017-09-08 MED ORDER — IOPAMIDOL (ISOVUE-300) INJECTION 61%
100.0000 mL | Freq: Once | INTRAVENOUS | Status: AC | PRN
  Administered 2017-09-08: 80 mL via INTRAVENOUS

## 2017-09-08 MED ORDER — SODIUM CHLORIDE 0.9 % IV BOLUS (SEPSIS)
1000.0000 mL | Freq: Once | INTRAVENOUS | Status: AC
  Administered 2017-09-08: 1000 mL via INTRAVENOUS

## 2017-09-08 MED ORDER — ONDANSETRON HCL 4 MG/2ML IJ SOLN
4.0000 mg | Freq: Four times a day (QID) | INTRAMUSCULAR | Status: DC | PRN
  Administered 2017-09-08 – 2017-09-10 (×4): 4 mg via INTRAVENOUS
  Filled 2017-09-08 (×4): qty 2

## 2017-09-08 MED ORDER — ONDANSETRON HCL 4 MG/2ML IJ SOLN
4.0000 mg | Freq: Once | INTRAMUSCULAR | Status: AC
  Administered 2017-09-08: 4 mg via INTRAVENOUS

## 2017-09-08 MED ORDER — ONDANSETRON HCL 4 MG/2ML IJ SOLN
INTRAMUSCULAR | Status: AC
  Filled 2017-09-08: qty 2

## 2017-09-08 MED ORDER — SODIUM CHLORIDE 0.9 % IV SOLN
INTRAVENOUS | Status: DC
  Administered 2017-09-08 – 2017-09-09 (×2): via INTRAVENOUS

## 2017-09-08 MED ORDER — ENOXAPARIN SODIUM 30 MG/0.3ML ~~LOC~~ SOLN
30.0000 mg | SUBCUTANEOUS | Status: DC
  Administered 2017-09-08 – 2017-09-09 (×2): 30 mg via SUBCUTANEOUS
  Filled 2017-09-08 (×2): qty 0.3

## 2017-09-08 MED ORDER — ONDANSETRON HCL 4 MG PO TABS: 4.0000 mg | ORAL_TABLET | Freq: Four times a day (QID) | ORAL | Status: DC | PRN

## 2017-09-08 MED ORDER — ACETAMINOPHEN 650 MG RE SUPP: 650.0000 mg | Freq: Four times a day (QID) | RECTAL | Status: DC | PRN

## 2017-09-08 NOTE — ED Provider Notes (Signed)
[facility] EMERGENCY DEPARTMENT Provider Note   CSN: [id number] Arrival date & time: 09/08/17  1058     History   Chief Complaint Chief Complaint  Patient presents with  . Numbness  . Emesis    HPI [name] is a 83 y.o. female.  HPI   Patient is an 83 year old female with extensive past medical history presenting today with nausea vomiting diarrhea.  Patient reports that since Sunday she has been both vomiting and having diarrhea.  Patient reports mild abdominal pain, occasional gassy crampy pain.  Was located in the right lower quadrant prior to arrival.  Patient was seen upstairs by her primary care physician and sent down here for further workup.  Patient not noticed any blood in the vomit or diarrhea.  Patient has not had any fever.  No urinary symptoms no cough no congestion.  Patient does state she occasionally has "numbness to the left side of her face".  It is worse after vomiting.  Right now just "feels funny".  She reports that it comes and goes.  Past Medical History:  Diagnosis Date  . Autoimmune disease ([place])   . Barrett's esophagus   . Bone lesion    sacrum  . Breast cancer ([place]) 2010   cT1 N0 M0; ER+/ PR+/ HER2 neg; s/p lumpectomy 07/2010; started adjuvant hormonal therapy 10/2009  . Cervical cancer ([place])   . Dysphagia   . Fracture of left shoulder   . Fx distal radius NEC-closed   . Glaucoma   . Humerus fracture 03/2012   impacted left humueral neck fracture, treated by Dr. Amedeo Plenty  . Hyperlipidemia   . Hypertension   . Neuromuscular disorder ([place])   . Osteopenia   . Osteoporosis   . Osteoporosis 03/18/2012  . Personal history of chemotherapy 2010   Left Breast Cancer  . Personal history of radiation therapy 2010   Left Breast Cancer  . Pneumonia     Patient Active Problem List   Diagnosis Date Noted  . Hx of meningioma of the brain 08/28/2017  . Hoarseness of voice 02/28/2016  . Raynaud's phenomenon 02/28/2016  . Esophageal spasm  09/20/2013  . Trigger finger 07/08/2012  . Osteoporosis 03/18/2012  . Fracture, shoulder 03/06/2012  . Hyperlipidemia   . Breast cancer [facility])     Past Surgical History:  Procedure Laterality Date  . ABDOMINAL HYSTERECTOMY  1972   cancer          1 tube 1 ovary  . APPENDECTOMY    . BREAST LUMPECTOMY Left 2010  . colon polyp removal    . EYE SURGERY    . HAND SURGERY    . ortho procedures     multiple  . OVARIAN CYST REMOVAL  1978  . [place]   left    OB History    No data available       Home Medications    Prior to Admission medications   Medication Sig Start Date End Date Taking? Authorizing Provider  Calcium Carbonate-Vitamin D (CALCIUM + D PO) Take 1 capsule by mouth 3 (three) times daily. Taking 2 pills per day    [provider]  latanoprost (XALATAN) 0.005 % ophthalmic solution 1 drop at bedtime.    [provider]  Multiple Vitamin (MULTIVITAMIN) capsule Take 1 capsule by mouth daily.    [provider]  rosuvastatin (CRESTOR) 10 MG tablet TAKE 1 BY MOUTH DAILY 03/02/17   Copland, Gay Filler, MD  Family History Family History  Problem Relation Age of Onset  . Heart disease Father   . Cancer Sister        lung, kidney  . Cancer Brother        brain  . Cancer Paternal Aunt        breast  . Breast cancer Paternal Aunt   . Cancer Brother        liver, lung  . Osteoporosis Neg Hx     Social History Social History   Tobacco Use  . Smoking status: Former Smoker    Last attempt to quit: 12/16/1986    Years since quitting: 30.7  . Smokeless tobacco: Never Used  . Tobacco comment: FORMER SMOKER 30 YEARS AGO  Substance Use Topics  . Alcohol use: Yes    Alcohol/week: 0.0 oz    Comment: OCCASIONALLY  . Drug use: No     Allergies   Daypro [oxaprozin]   Review of Systems Review of Systems  Constitutional: Negative for activity change, fatigue and fever.  Respiratory: Negative for shortness of breath.     Cardiovascular: Negative for chest pain.  Gastrointestinal: Positive for diarrhea, nausea and vomiting. Negative for abdominal pain.  Neurological: Positive for numbness.     Physical Exam Updated Vital Signs Ht '5\' 4"'$  (1.626 m)   Wt 49 kg (108 lb)   BMI 18.54 kg/m   Physical Exam  Constitutional: She is oriented to person, place, and time. She appears well-developed and well-nourished.  HENT:  Head: Normocephalic and atraumatic.  Eyes: Right eye exhibits no discharge. Left eye exhibits no discharge.  Cardiovascular: Normal rate, regular rhythm and normal heart sounds.  No murmur heard. Pulmonary/Chest: Effort normal and breath sounds normal. She has no wheezes. She has no rales.  Abdominal: Soft. She exhibits no distension. There is no tenderness.  Neurological: She is oriented to person, place, and time.  Equal strength bilaterally upper and lower extremities . Slight "funnyness" on left face.Marland Kitchen Speech comprehensible, no slurring. Facial nerve tested and appears grossly normal. Alert and oriented 3.   Skin: Skin is warm and dry. She is not diaphoretic.  Psychiatric: She has a normal mood and affect.  Nursing note and vitals reviewed.    ED Treatments / Results  Labs (all labs ordered are listed, but only abnormal results are displayed) Labs Reviewed - No data to display  EKG  EKG Interpretation None       Radiology No results found.  Procedures Procedures (including critical care time)  Medications Ordered in ED Medications  sodium chloride 0.9 % bolus 1,000 mL (not administered)     Initial Impression / Assessment and Plan / ED Course  I have reviewed the triage vital signs and the nursing notes.  Pertinent labs & imaging results that were available during my care of the patient were reviewed by me and considered in my medical decision making (see chart for details).     Patient is an 83 year old female with extensive past medical history presenting  today with nausea vomiting diarrhea.  Patient reports that since Sunday she has been both vomiting and having diarrhea.  Patient reports mild abdominal pain, occasional gassy crampy pain.  Was located in the right lower quadrant prior to arrival.  Patient was seen upstairs by her primary care physician and sent down here for further workup.  Patient not noticed any blood in the vomit or diarrhea.  Patient has not had any fever.  No urinary symptoms no cough no  congestion.  Patient does state she occasionally has "numbness to the left side of her face".  It is worse after vomiting.  Right now just "feels funny".  She reports that it comes and goes.  11:37 AM Workup patient for the nausea vomiting diarrhea.  Although with the straight the symptoms, likely to be viral gastroenteritis.  We will give patient fluids.  I am unsure about the intermittent left-sided numbness to her face.  It would be odd for patient to have a concurrent stroke with her viral gastroenteritis.  We will continue to monitor.  2:29 PM Patient has had a hard time getting her workup done because she has had to use the restroom so many times.  Patient had copious amounts of large volume of smelly, watery stool.  Concern for bacterial enteritis versus C. difficile.  Will need to admit patient for unable to keep her hydrated she cannot take p.o. and she is continually stooling.   Will admit to the hospital for continued hydration awaiting stool studies.    Final Clinical Impressions(s) / ED Diagnoses   Final diagnoses:  None    ED Discharge Orders    None       Macarthur Critchley, MD 09/16/17 [phone]

## 2017-09-08 NOTE — Patient Instructions (Signed)
Please proceed to the ED for further evaluation.

## 2017-09-08 NOTE — Progress Notes (Signed)
Subjective:    Patient ID: [name], female    DOB: [birth year], 83 y.o.   MRN: [id number]  HPI  [name] is an 83 yr old female who presents today with chief complaint of N/V/D which began 2 days ago.  Reports sharp RLQ pain.  Had episode of left sided facial numbness this AM after vomiting which lasted only a few seconds. Had similar episode over the weekend. She denies fever.  Has not kept anything down since Saturday night.  Denies hematemesis/or bloody stools.  Denies unilateral weakness, slurred speech.    Review of Systems See HPI  Past Medical History:  Diagnosis Date  . Autoimmune disease ([place])   . Barrett's esophagus   . Bone lesion    sacrum  . Breast cancer ([place]) 2010   cT1 N0 M0; ER+/ PR+/ HER2 neg; s/p lumpectomy 07/2010; started adjuvant hormonal therapy 10/2009  . Cervical cancer ([place])   . Dysphagia   . Fracture of left shoulder   . Fx distal radius NEC-closed   . Glaucoma   . Humerus fracture 03/2012   impacted left humueral neck fracture, treated by Dr. Amedeo Plenty  . Hyperlipidemia   . Hypertension   . Neuromuscular disorder ([place])   . Osteopenia   . Osteoporosis   . Osteoporosis 03/18/2012  . Personal history of chemotherapy 2010   Left Breast Cancer  . Personal history of radiation therapy 2010   Left Breast Cancer  . Pneumonia      Social History   Socioeconomic History  . Marital status: Married    Spouse name: Not on file  . Number of children: Not on file  . Years of education: Not on file  . Highest education level: Not on file  Social Needs  . Financial resource strain: Not on file  . Food insecurity - worry: Not on file  . Food insecurity - inability: Not on file  . Transportation needs - medical: Not on file  . Transportation needs - non-medical: Not on file  Occupational History  . Not on file  Tobacco Use  . Smoking status: Former Smoker    Last attempt to quit: 12/16/1986    Years since quitting: 30.7  . Smokeless tobacco: Never  Used  . Tobacco comment: FORMER SMOKER 30 YEARS AGO  Substance and Sexual Activity  . Alcohol use: Yes    Alcohol/week: 0.0 oz    Comment: OCCASIONALLY  . Drug use: No  . Sexual activity: Not on file  Other Topics Concern  . Not on file  Social History Narrative   Married   Exercise: Yes    Past Surgical History:  Procedure Laterality Date  . ABDOMINAL HYSTERECTOMY  1972   cancer          1 tube 1 ovary  . APPENDECTOMY    . BREAST LUMPECTOMY Left 2010  . colon polyp removal    . EYE SURGERY    . HAND SURGERY    . ortho procedures     multiple  . OVARIAN CYST REMOVAL  1978  . SHOULDER SURGERY  1954   left    Family History  Problem Relation Age of Onset  . Heart disease Father   . Cancer Sister        lung, kidney  . Cancer Brother        brain  . Cancer Paternal Aunt        breast  . Breast cancer Paternal Aunt   .  Cancer Brother        liver, lung  . Osteoporosis Neg Hx     Allergies  Allergen Reactions  . Daypro [Oxaprozin] Swelling    Face and tongue    Current Outpatient Medications on File Prior to Visit  Medication Sig Dispense Refill  . Calcium Carbonate-Vitamin D (CALCIUM + D PO) Take 1 capsule by mouth 3 (three) times daily. Taking 2 pills per day    . latanoprost (XALATAN) 0.005 % ophthalmic solution 1 drop at bedtime.    . Multiple Vitamin (MULTIVITAMIN) capsule Take 1 capsule by mouth daily.    . rosuvastatin (CRESTOR) 10 MG tablet TAKE 1 BY MOUTH DAILY 90 tablet 3   No current facility-administered medications on file prior to visit.     BP 131/75 (BP Location: Right Arm, Cuff Size: Normal)   Pulse 72   Temp (!) 97.5 F (36.4 C) (Axillary)   Resp 16   Ht '5\' 2"'$  (1.575 m)   Wt 108 lb 6.4 oz (49.2 kg)   SpO2 100%   BMI 19.83 kg/m       Objective:   Physical Exam  Constitutional: She is oriented to person, place, and time. She appears well-developed and well-nourished.  HENT:  Head: Normocephalic and atraumatic.  Eyes: EOM are  normal.  Cardiovascular: Normal rate, regular rhythm and normal heart sounds.  No murmur heard. Pulmonary/Chest: Effort normal and breath sounds normal. No respiratory distress. She has no wheezes.  Musculoskeletal: She exhibits no edema.  Neurological: She is alert and oriented to person, place, and time. No cranial nerve deficit. She exhibits normal muscle tone.  Reflex Scores:      Patellar reflexes are 2+ on the right side and 2+ on the left side. Psychiatric: She has a normal mood and affect. Her behavior is normal. Judgment and thought content normal.          Assessment & Plan:  Abdominal pain- ongoing N/V/D, unable to keep down fluids or food x 2 days. Will refer to ER for further evaluation and IV fluids.  Left facial numbness- currently resolved.  Neuro intact today during exam. May need CT head to further evaluate.   Will refer to ED for further evaluation. Patient and daughter are in agreement.  Report given to Dr. Louanne Belton at [facility] ED.

## 2017-09-08 NOTE — ED Notes (Signed)
Pt up to the bedside multiple times with diarrhea. States she cannot control it.

## 2017-09-08 NOTE — ED Triage Notes (Signed)
Pt reports feeling "puny" on Saturday, was able to attend church Sunday am, when she got home started having vomiting and diarrhea. Pt states each time she vomits, the left side of her face "goes numb" for a few seconds then resolves. Presents here today because the numbness to the left side of her face did not resolve this morning after vomiting twice. Pt states "it's just a little bit, but it's still there." pt denies any pain, ha, visual changes or otherc/o, moe + x 4 ext, speech is clear. Pt states she feels "lightheaded" and has felt that way continuously since Sunday.

## 2017-09-08 NOTE — H&P (Signed)
History and Physical    [name] LMB:[id number] DOB: July 26, 1931 DOA: 09/08/2017  PCP: Darreld Mclean, MD Patient coming from: home  I have personally briefly reviewed patient's old medical records in [place]  Chief Complaint: Nausea, vomiting, diarrhea  HPI: [name] is [name] 83 y.o. female with medical history significant of breast cancer in remission, osteoporosis, glaucoma presenting with nausea vomiting diarrhea.  She notes that her symptoms started on Saturday.  On Saturday night she noticed that she just did not feel well.  She noted generalized malaise and that she just felt sick.  She felt similar symptoms on Sunday morning, but went to church.  When she got home from church in the afternoon she tried to eat something and afterwards had nausea and vomiting as well as diarrhea which has been persistent up until today.  She notes she has had 2-3 episodes of emesis Beonka Amesquita day which she describes as nonbloody.  She also notes up to 10 episodes of nonbloody stool Gionni Freese day.  She was having right lower quadrant abdominal pain and most recently left lower quadrant, but has been no pain at this point time.  Nothing makes her symptoms any better or worse.  She denies any fevers.  She does have occasional chills.  She denies any recent travel, but does note that both her and her son-in-law have had similar symptoms.  They ate Thanksgiving dinner together last Thursday and both began getting symptoms on Saturday.  No one else at the dinner had any similar symptoms.  In addition to her GI symptoms, she is also had numbness on the left side of her face.  The first time that she noticed this was on Sunday when she threw up.  She notes that it initially was associated only with her emesis, lasting seconds.  She is recently noticed it without vomiting.  She denies any other numbness, tingling, weakness or strokelike symptoms.  She denies any dysarthria.  ED Course: In the emergency department she  had basic labs, EKG, CT scan and transferred to [facility] due to inability to tolerate p.o. and persistent nausea vomiting diarrhea.  Review of Systems: As per HPI otherwise 10 point review of systems negative.   Past Medical History:  Diagnosis Date  . Autoimmune disease ([place])   . Barrett's esophagus   . Bone lesion    sacrum  . Breast cancer ([place]) 2010   cT1 N0 M0; ER+/ PR+/ HER2 neg; s/p lumpectomy 07/2010; started adjuvant hormonal therapy 10/2009  . Cervical cancer ([place])   . Dysphagia   . Fracture of left shoulder   . Fx distal radius NEC-closed   . Glaucoma   . Humerus fracture 03/2012   impacted left humueral neck fracture, treated by Dr. Amedeo Plenty  . Hyperlipidemia   . Hypertension   . Neuromuscular disorder ([place])   . Osteopenia   . Osteoporosis   . Osteoporosis 03/18/2012  . Personal history of chemotherapy 2010   Left Breast Cancer  . Personal history of radiation therapy 2010   Left Breast Cancer  . Pneumonia     Past Surgical History:  Procedure Laterality Date  . ABDOMINAL HYSTERECTOMY  1972   cancer          1 tube 1 ovary  . APPENDECTOMY    . BREAST LUMPECTOMY Left 2010  . colon polyp removal    . EYE SURGERY    . HAND SURGERY    . ortho procedures  multiple  . OVARIAN CYST REMOVAL  1978  . [place]   left     reports that she quit smoking about 30 years ago. she has never used smokeless tobacco. She reports that she drinks alcohol. She reports that she does not use drugs.  Allergies  Allergen Reactions  . Daypro [Oxaprozin] Swelling    Face and tongue    Family History  Problem Relation Age of Onset  . Heart disease Father   . Cancer Sister        lung, kidney  . Cancer Brother        brain  . Cancer Paternal Aunt        breast  . Breast cancer Paternal Aunt   . Cancer Brother        liver, lung  . Osteoporosis Neg Hx     Prior to Admission medications   Medication Sig Start Date End Date Taking? Authorizing Provider    Calcium Carbonate-Vitamin D (CALCIUM + D PO) Take 1 capsule by mouth 2 (two) times daily. Taking 2 pills per day   Yes [provider]  latanoprost (XALATAN) 0.005 % ophthalmic solution Place 1 drop into both eyes at bedtime.    Yes [provider]  Multiple Vitamin (MULTIVITAMIN) capsule Take 1 capsule by mouth daily.   Yes [provider]  rosuvastatin (CRESTOR) 10 MG tablet TAKE 1 BY MOUTH DAILY 03/02/17  Yes Copland, Gay Filler, MD    Physical Exam: Vitals:   09/08/17 1330 09/08/17 1511 09/08/17 1620 09/08/17 1720  BP: 123/66 127/72 133/66 135/63  Pulse: 70 73 75 65  Resp: 17 (!) '24 20 18  '$ Temp:    (!) 97.3 F (36.3 C)  TempSrc:    Oral  SpO2: 100% 100% 100% 99%  Weight:    49.2 kg (108 lb 7.5 oz)  Height:    '5\' 4"'$  (1.626 m)    Constitutional: NAD, calm, comfortable Vitals:   09/08/17 1330 09/08/17 1511 09/08/17 1620 09/08/17 1720  BP: 123/66 127/72 133/66 135/63  Pulse: 70 73 75 65  Resp: 17 (!) '24 20 18  '$ Temp:    (!) 97.3 F (36.3 C)  TempSrc:    Oral  SpO2: 100% 100% 100% 99%  Weight:    49.2 kg (108 lb 7.5 oz)  Height:    '5\' 4"'$  (1.626 m)   Eyes: PERRL, lids and conjunctivae normal ENMT: Mucous membranes are moist. Posterior pharynx clear of any exudate or lesions.Normal dentition.  Neck: normal, supple, no masses, no thyromegaly Respiratory: clear to auscultation bilaterally, no wheezing, no crackles. Normal respiratory effort. No accessory muscle use.  Cardiovascular: Irregular rate and rhythm, no murmurs / rubs / gallops. No extremity edema. 2+ pedal pulses. No carotid bruits.  Abdomen: no tenderness, no masses palpated. No hepatosplenomegaly. Bowel sounds positive.  Musculoskeletal: no clubbing / cyanosis. No joint deformity upper and lower extremities. Good ROM, no contractures. Normal muscle tone.  Skin: no rashes, lesions, ulcers. No induration Neurologic: CN 2-12 intact. Sensation intact. Strength 5/5 in all 4.  Psychiatric: Normal  judgment and insight. Alert and oriented x 3. Normal mood.   Labs on Admission: I have personally reviewed following labs and imaging studies  CBC: Recent Labs  Lab 09/08/17 1126  WBC 7.1  NEUTROABS 5.2  HGB 12.9  HCT 39.7  MCV 91.9  PLT 885   Basic Metabolic Panel: Recent Labs  Lab 09/08/17 1126  NA 133*  K 3.7  CL 102  CO2 23  GLUCOSE 98  BUN 26*  CREATININE 1.19*  CALCIUM 8.8*   GFR: Estimated Creatinine Clearance: 27.3 mL/min (Willam Munford) (by C-G formula based on SCr of 1.19 mg/dL (H)). Liver Function Tests: Recent Labs  Lab 09/08/17 1126  AST 31  ALT 23  ALKPHOS 81  BILITOT 0.4  PROT 7.1  ALBUMIN 3.9   Recent Labs  Lab 09/08/17 1126  LIPASE 27   No results for input(s): AMMONIA in the last 168 hours. Coagulation Profile: No results for input(s): INR, PROTIME in the last 168 hours. Cardiac Enzymes: No results for input(s): CKTOTAL, CKMB, CKMBINDEX, TROPONINI in the last 168 hours. BNP (last 3 results) No results for input(s): PROBNP in the last 8760 hours. HbA1C: No results for input(s): HGBA1C in the last 72 hours. CBG: No results for input(s): GLUCAP in the last 168 hours. Lipid Profile: No results for input(s): CHOL, HDL, LDLCALC, TRIG, CHOLHDL, LDLDIRECT in the last 72 hours. Thyroid Function Tests: No results for input(s): TSH, T4TOTAL, FREET4, T3FREE, THYROIDAB in the last 72 hours. Anemia Panel: No results for input(s): VITAMINB12, FOLATE, FERRITIN, TIBC, IRON, RETICCTPCT in the last 72 hours. Urine analysis:    Component Value Date/Time   COLORURINE YELLOW 09/08/2017 1430   APPEARANCEUR CLEAR 09/08/2017 1430   LABSPEC <1.005 (L) 09/08/2017 1430   LABSPEC 1.010 06/05/2010 1019   PHURINE 6.0 09/08/2017 1430   GLUCOSEU NEGATIVE 09/08/2017 1430   HGBUR NEGATIVE 09/08/2017 1430   BILIRUBINUR NEGATIVE 09/08/2017 1430   BILIRUBINUR negative 02/28/2016 0945   BILIRUBINUR Negative 06/05/2010 1019   KETONESUR NEGATIVE 09/08/2017 1430   PROTEINUR  NEGATIVE 09/08/2017 1430   UROBILINOGEN 0.2 02/28/2016 0945   UROBILINOGEN 0.2 08/24/2014 1425   NITRITE NEGATIVE 09/08/2017 1430   LEUKOCYTESUR NEGATIVE 09/08/2017 1430   LEUKOCYTESUR Negative 06/05/2010 1019    Radiological Exams on Admission: Ct Abdomen Pelvis W Contrast  Result Date: 09/08/2017 CLINICAL DATA:  83 year old with lower quadrant pain. Nausea and vomiting. History of breast cancer, hysterectomy and appendectomy. EXAM: CT ABDOMEN AND PELVIS WITH CONTRAST TECHNIQUE: Multidetector CT imaging of the abdomen and pelvis was performed using the standard protocol following bolus administration of intravenous contrast. CONTRAST:  47m ISOVUE-300 IOPAMIDOL (ISOVUE-300) INJECTION 61% COMPARISON:  05/20/2013 FINDINGS: Lower chest: Peripheral pleural-based densities at the lung bases are most compatible with scarring and postinflammatory changes. No large pleural effusions. However, the right pleural densities have progressed since 2014. Again noted is Jasslyn Finkel small amount of of pericardial fluid particularly along the posterior aspect. There is Raizel Wesolowski small hiatal hernia. Hepatobiliary: Low-density structures near the hepatic dome are most compatible with cysts. Otherwise, normal appearance of the liver and gallbladder. Portal venous system is patent. Pancreas: Normal appearance of the pancreas without inflammation or duct dilatation. Spleen: Normal appearance of spleen without enlargement. Adrenals/Urinary Tract: Normal adrenal glands. Probable small cyst in the left kidney lower pole region. Urinary bladder is mildly distended. No significant hydronephrosis. Small exophytic low-density structure in the right kidney upper pole probably represents Priscilla Finklea cyst. Stomach/Bowel: Small hiatal hernia. Fluid and dilated loops of small bowel. Small bowel loops measure up to 3 cm. Fluid-filled colon. No distinct transition point along the bowel. Vascular/Lymphatic: Atherosclerotic calcifications in the abdominal aorta  without aneurysm. The abdominal aorta is tortuous. The iliac arteries are patent. No lymph node enlargement in the abdomen or pelvis. Reproductive: Status post hysterectomy. No adnexal masses. Other: No free fluid.  Negative for free air. Musculoskeletal: Stable mild retrolisthesis at L3-L4. Old compression deformity at L1 which  is stable. IMPRESSION: Mildly dilated loops of small bowel containing fluid. There is also fluid in the colon. Findings are nonspecific but could be related to an underlying enteritis. Obstructive process is thought to be less likely. Increased densities at the right lung base may represent increased volume loss and scarring compared to the previous examination. Hiatal hernia. Hepatic and renal cysts. Electronically Signed   By: Markus Daft M.D.   On: 09/08/2017 14:11    EKG: Independently reviewed. Compared with priors.  NSR, PVCs.  Appears similar to priors.   Assessment/Plan Active Problems:   Diarrhea  Nausea  Vomiting  Diarrhea:  Likely infectious as her son in law has similar symptoms.  CT with nonspecific findings (mildly dilated loops of small bowel containing fluid and fluid in the colon), possible enteritis (obstructive process thought to be less likely).  Normal WBC count, UA not suggestive of infection.  Negative C diff GI pathogen panel pending Once GI pathogen panel results, if negative consider lomotil or imodium  IVF Consider abx if not improving  L sided facial numbness:  Unusual, isolated L sided facial numbness (she describes as fleeting, lasting seconds) initially with her vomiting and now sometimes coming and going by itself.  Currently asymptomatic.  Started on 11/25.  Will hold off on imaging for now, she had MRI brain on 11/10 that showed Ellina Sivertsen 7 mm meningioma in L tentorium as well as atrophy and mild chronic microvascular ischemia.  Had recent carotid dopplers with no hemodynamically significant stenosis.  Low suspicion for CVA with isolated symptoms  that resolve within seconds.  For now will continue to monitor, consider discuss with neurology tomorrow.  Acute Kidney Injury:  2/2 dehydration due to above.  Baseline Cr. <1  IVF.  Irregular HR:  Pt with history of PVC's.  Follow up EKG and tele.    Glaucoma: continue eye drops  History of breast cancer: s/p lumpectomy, radiation, anastrozole.  In remission without disease recurrence.  Last saw Dr. Alvy Bimler in 2015.    Osteoporsis: calcium/vit D, holding for now  DVT prophylaxis: lovenox  Code Status: full  Family Communication: husband, family at bedside  Disposition Plan: pending improvement  Consults called: none Admission status: inpatient    Fayrene Helper MD [facility] Pager [phone]  If 7PM-7AM, please contact night-coverage [email or web address] Password [facility]  09/08/2017, 6:42 PM

## 2017-09-09 LAB — GASTROINTESTINAL PANEL BY PCR, STOOL (REPLACES STOOL CULTURE)

## 2017-09-09 LAB — COMPREHENSIVE METABOLIC PANEL
ALT: 19 U/L (ref 14–54)
AST: 25 U/L (ref 15–41)
Albumin: 3.5 g/dL (ref 3.5–5.0)
Alkaline Phosphatase: 71 U/L (ref 38–126)
Anion gap: 3 — ABNORMAL LOW (ref 5–15)
BUN: 17 mg/dL (ref 6–20)
CO2: 23 mmol/L (ref 22–32)
Calcium: 7.9 mg/dL — ABNORMAL LOW (ref 8.9–10.3)
Chloride: 114 mmol/L — ABNORMAL HIGH (ref 101–111)
Creatinine, Ser: 0.96 mg/dL (ref 0.44–1.00)
GFR calc Af Amer: >60 mL/min (ref >60)
GFR calc non Af Amer: 53 mL/min — ABNORMAL LOW (ref >60)
Glucose, Bld: 86 mg/dL (ref 65–99)
Potassium: 4.3 mmol/L (ref 3.5–5.1)
Sodium: 140 mmol/L (ref 135–145)
Total Bilirubin: 0.6 mg/dL (ref 0.3–1.2)
Total Protein: 6.3 g/dL — ABNORMAL LOW (ref 6.5–8.1)

## 2017-09-09 LAB — CBC
HCT: 37.3 % (ref 36.0–46.0)
Hemoglobin: 11.8 g/dL — ABNORMAL LOW (ref 12.0–15.0)
MCH: 29.6 pg (ref 26.0–34.0)
MCHC: 31.6 g/dL (ref 30.0–36.0)
MCV: 93.7 fL (ref 78.0–100.0)
Platelets: 199 10*3/uL (ref 150–400)
RBC: 3.98 MIL/uL (ref 3.87–5.11)
RDW: 15.7 % — ABNORMAL HIGH (ref 11.5–15.5)
WBC: 5.6 10*3/uL (ref 4.0–10.5)

## 2017-09-09 LAB — GLUCOSE, CAPILLARY: Glucose-Capillary: 76 mg/dL (ref 65–99)

## 2017-09-09 MED ORDER — LOPERAMIDE HCL 2 MG PO CAPS: 2.0000 mg | ORAL_CAPSULE | ORAL | Status: DC | PRN

## 2017-09-09 NOTE — Progress Notes (Signed)
[facility]  PROGRESS NOTE  [name] EHO:[id number] DOB: [birth year] DOA: 09/08/2017 PCP: Darreld Mclean, MD   Brief HPI:     83 y.o. female with medical history significant of breast cancer in remission, osteoporosis, glaucoma presenting with nausea vomiting diarrhea.     Subjective   Diarrhea is improving.   Assessment/Plan:     1. Gastroenteritis-improving, GI pathogen panel showed sapovirus.  Will start Imodium as needed. 2. Left facial numbness--intermittent, exacerbated by vomiting.  MRI brain on 11/10 showed 7 mm meningioma in  left tentorium, as well as atrophy and mild chronic microvascular ischemia.Had recent carotid dopplers with no hemodynamically significant stenosis.  Low suspicion for CVA with isolated symptoms that resolve within seconds.  For now will continue to monitor, will this is refractory and he arrested discuss with neurology tomorrow. 3. Acute kidney injury-due to dehydration.  Creatinine has improved to 0.96 after IV fluids. 4. History of breast cancer- s/p lumpectomy, radiation, anastrozole.  In remission without disease recurrence.  Last saw Dr. Alvy Bimler in 2015.       DVT prophylaxis: Lovenox  Code Status: Full code  Family Communication: Discussed with patient's daughters at bedside  Disposition Plan: likely home in 1-2 days   Consultants:  None   Procedures:  None   Continuous infusions . sodium chloride 75 mL/hr at 09/09/17 1002      Antibiotics:   Anti-infectives (From admission, onward)   None       Objective   Vitals:   09/08/17 1720 09/08/17 2245 09/09/17 0514 09/09/17 1406  BP: 135/63 (!) 109/50 (!) 104/57 124/61  Pulse: 65 68 60 (!) 58  Resp: 18 18 18 18   Temp: (!) 97.3 F (36.3 C) 97.7 F (36.5 C) 97.7 F (36.5 C) 97.6 F (36.4 C)  TempSrc: Oral Oral Oral Oral  SpO2: 99% 100% 97% 100%  Weight: 49.2 kg (108 lb 7.5 oz)     Height: 5\' 4"  (1.626 m)       Intake/Output Summary (Last 24 hours)  at 09/09/2017 1846 Last data filed at 09/09/2017 1002 Gross per 24 hour  Intake 1507.5 ml  Output -  Net 1507.5 ml   Filed Weights   09/08/17 1109 09/08/17 1720  Weight: 49 kg (108 lb) 49.2 kg (108 lb 7.5 oz)     Physical Examination:   Physical Exam: Eyes: No icterus, extraocular muscles intact  Mouth: Oral mucosa is moist, no lesions on palate,  Neck: Supple, no deformities, masses, or tenderness Lungs: Normal respiratory effort, bilateral clear to auscultation, no crackles or wheezes.  Heart: Regular rate and rhythm, S1 and GI pathogen panel GI pathogen panel S2 normal, no murmurs, rubs auscultated Abdomen: BS normoactive,soft,nondistended,non-tender to palpation,no organomegaly Extremities: No pretibial edema, no erythema, no cyanosis, no clubbing Neuro : Alert and oriented to time, place and person, No focal deficits  Skin: No rashes seen on exam     Data Reviewed: I have personally reviewed following labs and imaging studies  CBG: Recent Labs  Lab 09/09/17 0719  GLUCAP 76    CBC: Recent Labs  Lab 09/08/17 1126 09/09/17 0635  WBC 7.1 5.6  NEUTROABS 5.2  --   HGB 12.9 11.8*  HCT 39.7 37.3  MCV 91.9 93.7  PLT 232 370    Basic Metabolic Panel: Recent Labs  Lab 09/08/17 1126 09/09/17 0635  NA 133* 140  K 3.7 4.3  CL 102 114*  CO2 23 23  GLUCOSE 98 86  BUN 26* 17  CREATININE 1.19* 0.96  CALCIUM 8.8* 7.9*    Recent Results (from the past 240 hour(s))  Gastrointestinal Panel by PCR , Stool     Status: Abnormal   Collection Time: 09/08/17 12:00 PM  Result Value Ref Range Status   Campylobacter species NOT DETECTED NOT DETECTED Final   Plesimonas shigelloides NOT DETECTED NOT DETECTED Final   Salmonella species NOT DETECTED NOT DETECTED Final   Yersinia enterocolitica NOT DETECTED NOT DETECTED Final   Vibrio species NOT DETECTED NOT DETECTED Final   Vibrio cholerae NOT DETECTED NOT DETECTED Final   Enteroaggregative E coli (EAEC) NOT DETECTED  NOT DETECTED Final   Enteropathogenic E coli (EPEC) NOT DETECTED NOT DETECTED Final   Enterotoxigenic E coli (ETEC) NOT DETECTED NOT DETECTED Final   Shiga like toxin producing E coli (STEC) NOT DETECTED NOT DETECTED Final   Shigella/Enteroinvasive E coli (EIEC) NOT DETECTED NOT DETECTED Final   Cryptosporidium NOT DETECTED NOT DETECTED Final   Cyclospora cayetanensis NOT DETECTED NOT DETECTED Final   Entamoeba histolytica NOT DETECTED NOT DETECTED Final   Giardia lamblia NOT DETECTED NOT DETECTED Final   Adenovirus F40/41 NOT DETECTED NOT DETECTED Final   Astrovirus NOT DETECTED NOT DETECTED Final   Norovirus GI/GII NOT DETECTED NOT DETECTED Final   Rotavirus A NOT DETECTED NOT DETECTED Final   Sapovirus (I, II, IV, and V) DETECTED (A) NOT DETECTED Final  C difficile quick scan w PCR reflex     Status: None   Collection Time: 09/08/17 12:00 PM  Result Value Ref Range Status   C Diff antigen NEGATIVE NEGATIVE Final   C Diff toxin NEGATIVE NEGATIVE Final   C Diff interpretation No C. difficile detected.  Final    Comment: Performed at [facility], [place] [address] [place], [place] [address]     Liver Function Tests: Recent Labs  Lab 09/08/17 1126 09/09/17 0635  AST 31 25  ALT 23 19  ALKPHOS 81 71  BILITOT 0.4 0.6  PROT 7.1 6.3*  ALBUMIN 3.9 3.5   Recent Labs  Lab 09/08/17 1126  LIPASE 27   No results for input(s): AMMONIA in the last 168 hours.  Cardiac Enzymes: No results for input(s): CKTOTAL, CKMB, CKMBINDEX, TROPONINI in the last 168 hours. BNP (last 3 results) No results for input(s): BNP in the last 8760 hours.  ProBNP (last 3 results) No results for input(s): PROBNP in the last 8760 hours.    Studies: Ct Abdomen Pelvis W Contrast  Result Date: 09/08/2017 CLINICAL DATA:  84 year old with lower quadrant pain. Nausea and vomiting. History of breast cancer, hysterectomy and appendectomy. EXAM: CT ABDOMEN AND PELVIS WITH CONTRAST TECHNIQUE: Multidetector  CT imaging of the abdomen and pelvis was performed using the standard protocol following bolus administration of intravenous contrast. CONTRAST:  4mL ISOVUE-300 IOPAMIDOL (ISOVUE-300) INJECTION 61% COMPARISON:  05/20/2013 FINDINGS: Lower chest: Peripheral pleural-based densities at the lung bases are most compatible with scarring and postinflammatory changes. No large pleural effusions. However, the right pleural densities have progressed since 2014. Again noted is a small amount of of pericardial fluid particularly along the posterior aspect. There is a small hiatal hernia. Hepatobiliary: Low-density structures near the hepatic dome are most compatible with cysts. Otherwise, normal appearance of the liver and gallbladder. Portal venous system is patent. Pancreas: Normal appearance of the pancreas without inflammation or duct dilatation. Spleen: Normal appearance of spleen without enlargement. Adrenals/Urinary Tract: Normal adrenal glands. Probable small cyst in the left kidney lower pole region. Urinary bladder  is mildly distended. No significant hydronephrosis. Small exophytic low-density structure in the right kidney upper pole probably represents a cyst. Stomach/Bowel: Small hiatal hernia. Fluid and dilated loops of small bowel. Small bowel loops measure up to 3 cm. Fluid-filled colon. No distinct transition point along the bowel. Vascular/Lymphatic: Atherosclerotic calcifications in the abdominal aorta without aneurysm. The abdominal aorta is tortuous. The iliac arteries are patent. No lymph node enlargement in the abdomen or pelvis. Reproductive: Status post hysterectomy. No adnexal masses. Other: No free fluid.  Negative for free air. Musculoskeletal: Stable mild retrolisthesis at L3-L4. Old compression deformity at L1 which is stable. IMPRESSION: Mildly dilated loops of small bowel containing fluid. There is also fluid in the colon. Findings are nonspecific but could be related to an underlying enteritis.  Obstructive process is thought to be less likely. Increased densities at the right lung base may represent increased volume loss and scarring compared to the previous examination. Hiatal hernia. Hepatic and renal cysts. Electronically Signed   By: Markus Daft M.D.   On: 09/08/2017 14:11    Scheduled Meds: . enoxaparin (LOVENOX) injection  30 mg Subcutaneous Q24H  . latanoprost  1 drop Both Eyes QHS      Time spent: 25 min  Oswald Hillock   [facility] Pager [phone]. If 7PM-7AM, please contact night-coverage at [email or web address], Office  [phone]  password [place]  09/09/2017, 6:46 PM  LOS: 1 day

## 2017-09-09 NOTE — Care Management Note (Signed)
Case Management Note  Patient Details  Name: [name] MRN: [id number] Date of Birth: 1931/08/15  Subjective/Objective:                  emesis  Action/Plan: Date: September 09, 2017 Velva Harman, BSN, [place], [place]  [phone] Chart and notes review for patient progress and needs. Will follow for case management and discharge needs. Next review date: [id number] Expected Discharge Date:  (unknown)               Expected Discharge Plan:  Home/Self Care  In-House Referral:     Discharge planning Services  CM Consult  Post Acute Care Choice:    Choice offered to:     DME Arranged:    DME Agency:     HH Arranged:    HH Agency:     Status of Service:  In process, will continue to follow  If discussed at Long Length of Stay Meetings, dates discussed:    Additional Comments:  Leeroy Cha, RN 09/09/2017, 8:57 AM

## 2017-09-10 DIAGNOSIS — R197 Diarrhea, unspecified: Secondary | ICD-10-CM

## 2017-09-10 DIAGNOSIS — H409 Unspecified glaucoma: Secondary | ICD-10-CM

## 2017-09-10 DIAGNOSIS — R7989 Other specified abnormal findings of blood chemistry: Secondary | ICD-10-CM

## 2017-09-10 DIAGNOSIS — R112 Nausea with vomiting, unspecified: Secondary | ICD-10-CM

## 2017-09-10 LAB — CBC
HCT: 34.3 % — ABNORMAL LOW (ref 36.0–46.0)
Hemoglobin: 10.8 g/dL — ABNORMAL LOW (ref 12.0–15.0)
MCH: 29.5 pg (ref 26.0–34.0)
MCHC: 31.5 g/dL (ref 30.0–36.0)
MCV: 93.7 fL (ref 78.0–100.0)
Platelets: 189 10*3/uL (ref 150–400)
RBC: 3.66 MIL/uL — ABNORMAL LOW (ref 3.87–5.11)
RDW: 15.9 % — ABNORMAL HIGH (ref 11.5–15.5)
WBC: 6.1 10*3/uL (ref 4.0–10.5)

## 2017-09-10 LAB — COMPREHENSIVE METABOLIC PANEL
ALT: 17 U/L (ref 14–54)
AST: 23 U/L (ref 15–41)
Albumin: 3.2 g/dL — ABNORMAL LOW (ref 3.5–5.0)
Alkaline Phosphatase: 63 U/L (ref 38–126)
Anion gap: 3 — ABNORMAL LOW (ref 5–15)
BUN: 8 mg/dL (ref 6–20)
CO2: 22 mmol/L (ref 22–32)
Calcium: 7.6 mg/dL — ABNORMAL LOW (ref 8.9–10.3)
Chloride: 116 mmol/L — ABNORMAL HIGH (ref 101–111)
Creatinine, Ser: 0.87 mg/dL (ref 0.44–1.00)
GFR calc Af Amer: >60 mL/min (ref >60)
GFR calc non Af Amer: 59 mL/min — ABNORMAL LOW (ref >60)
Glucose, Bld: 81 mg/dL (ref 65–99)
Potassium: 3.9 mmol/L (ref 3.5–5.1)
Sodium: 141 mmol/L (ref 135–145)
Total Bilirubin: 0.4 mg/dL (ref 0.3–1.2)
Total Protein: 5.5 g/dL — ABNORMAL LOW (ref 6.5–8.1)

## 2017-09-10 LAB — GLUCOSE, CAPILLARY: Glucose-Capillary: 72 mg/dL (ref 65–99)

## 2017-09-10 MED ORDER — LOPERAMIDE HCL 2 MG PO CAPS: 2.0000 mg | ORAL_CAPSULE | Freq: Four times a day (QID) | ORAL | 0 refills | Status: DC | PRN

## 2017-09-10 MED ORDER — ENOXAPARIN SODIUM 40 MG/0.4ML ~~LOC~~ SOLN: 40.0000 mg | SUBCUTANEOUS | Status: DC

## 2017-09-10 MED ORDER — ONDANSETRON HCL 4 MG PO TABS: 4.0000 mg | ORAL_TABLET | Freq: Four times a day (QID) | ORAL | 0 refills | Status: DC | PRN

## 2017-09-10 NOTE — Discharge Summary (Signed)
Physician Discharge Summary  [name] RSW:[id number] DOB: [birth year] DOA: 09/08/2017  PCP: Darreld Mclean, MD  Admit date: 09/08/2017 Discharge date: 09/10/2017  Time spent: *35 minutes  Recommendations for Outpatient Follow-up:  1. Follow-up PCP in 2 weeks  2. Follow-up neurology in 2-3 weeks for intermittent left-sided numbness   Discharge Diagnoses:  Principal Problem:   Nausea vomiting and diarrhea Active Problems:   Breast cancer (HCC)   Osteoporosis   Diarrhea   Elevated serum creatinine   PVC (premature ventricular contraction)   Glaucoma   Discharge Condition: Stable  Diet recommendation: Regular diet  Filed Weights   09/08/17 1109 09/08/17 1720 09/10/17 0614  Weight: 49 kg (108 lb) 49.2 kg (108 lb 7.5 oz) 52.3 kg (115 lb 4.8 oz)    History of present illness:  83 y.o.femalewith medical history significant ofbreast cancer in remission, osteoporosis, glaucoma presenting with nausea vomiting diarrhea.     Hospital Course:   1. Gastroenteritis-resolved. GI pathogen panel showed sapovirus.  Will start Imodium as needed.  Also prescribed Zofran as needed for nausea and vomiting. 2. Left facial numbness--intermittent, exacerbated by vomiting.  MRI brain on 11/10 showed 7 mm meningioma in  left tentorium, as well as atrophy and mild chronic microvascular ischemia.Had recent carotid dopplers with no hemodynamically significant stenosis. Low suspicion for CVA with isolated symptoms that resolve within seconds.  Called and discussed with neurologist on call Dr Rory Percy, who reviewed the MRI images done on 08/22/2017.  He does not feel that meningioma is causing the symptoms.  As the symptoms are transient and only involves left lower jaw,He recommends follow-up with neurologist for further workup for possible trigeminal neuralgia  3. Acute kidney injury-due to dehydration.  Creatinine has improved to 0.96 after IV fluids. 4. History of breast cancer- s/p  lumpectomy, radiation, anastrozole. In remission without disease recurrence. Last saw Dr. Alvy Bimler in 2015.      Procedures:  None  Consultations:  None  Discharge Exam: Vitals:   09/09/17 2003 09/10/17 0614  BP: (!) 128/58 (!) 110/56  Pulse: (!) 55 (!) 58  Resp: 17 20  Temp: (!) 97.5 F (36.4 C) 97.6 F (36.4 C)  SpO2: 100% 100%    General: Appears in no acute distress Cardiovascular: S1-S2, regular Respiratory: Clear to auscultation bilaterally  Discharge Instructions   Discharge Instructions    Diet - low sodium heart healthy   Complete by:  As directed    Increase activity slowly   Complete by:  As directed      Allergies as of 09/10/2017      Reactions   Daypro [oxaprozin] Swelling   Face and tongue      Medication List    TAKE these medications   CALCIUM + D PO Take 1 capsule by mouth 2 (two) times daily. Taking 2 pills per day   latanoprost 0.005 % ophthalmic solution Commonly known as:  XALATAN Place 1 drop into both eyes at bedtime.   loperamide 2 MG capsule Commonly known as:  IMODIUM Take 1 capsule (2 mg total) by mouth every 6 (six) hours as needed for diarrhea or loose stools.   multivitamin capsule Take 1 capsule by mouth daily.   ondansetron 4 MG tablet Commonly known as:  ZOFRAN Take 1 tablet (4 mg total) by mouth every 6 (six) hours as needed for nausea.   rosuvastatin 10 MG tablet Commonly known as:  CRESTOR TAKE 1 BY MOUTH DAILY      Allergies  Allergen Reactions  .  Daypro [Oxaprozin] Swelling    Face and tongue      The results of significant diagnostics from this hospitalization (including imaging, microbiology, ancillary and laboratory) are listed below for reference.    Significant Diagnostic Studies: Mr Jeri Cos XL Contrast  Result Date: 08/22/2017 CLINICAL DATA:  Followup meningioma.  History breast cancer EXAM: MRI HEAD WITHOUT AND WITH CONTRAST TECHNIQUE: Multiplanar, multiecho pulse sequences of the  brain and surrounding structures were obtained without and with intravenous contrast. CONTRAST:  82mL MULTIHANCE GADOBENATE DIMEGLUMINE 529 MG/ML IV SOLN COMPARISON:  CT head 08/24/2014 FINDINGS: Brain: Mild atrophy. Negative for hydrocephalus. Negative for acute infarct. Mild chronic microvascular ischemic change in the white matter. 7 mm calcification along the superior surface of the tentorium on the left with mild enhancement. This is compatible with a small meningioma. Calcification is unchanged from prior CTA 2015. No other enhancing mass lesion. Vascular: Normal arterial flow voids Skull and upper cervical spine: Negative Sinuses/Orbits: Mild mucosal edema paranasal sinuses. Bilateral lens replacement Other: None IMPRESSION: Mild atrophy and mild chronic microvascular ischemia 7 mm calcified meningioma left tentorium unchanged 2015. Electronically Signed   By: Franchot Gallo M.D.   On: 08/22/2017 14:35   Ct Abdomen Pelvis W Contrast  Result Date: 09/08/2017 CLINICAL DATA:  83 year old with lower quadrant pain. Nausea and vomiting. History of breast cancer, hysterectomy and appendectomy. EXAM: CT ABDOMEN AND PELVIS WITH CONTRAST TECHNIQUE: Multidetector CT imaging of the abdomen and pelvis was performed using the standard protocol following bolus administration of intravenous contrast. CONTRAST:  17mL ISOVUE-300 IOPAMIDOL (ISOVUE-300) INJECTION 61% COMPARISON:  05/20/2013 FINDINGS: Lower chest: Peripheral pleural-based densities at the lung bases are most compatible with scarring and postinflammatory changes. No large pleural effusions. However, the right pleural densities have progressed since 2014. Again noted is a small amount of of pericardial fluid particularly along the posterior aspect. There is a small hiatal hernia. Hepatobiliary: Low-density structures near the hepatic dome are most compatible with cysts. Otherwise, normal appearance of the liver and gallbladder. Portal venous system is patent.  Pancreas: Normal appearance of the pancreas without inflammation or duct dilatation. Spleen: Normal appearance of spleen without enlargement. Adrenals/Urinary Tract: Normal adrenal glands. Probable small cyst in the left kidney lower pole region. Urinary bladder is mildly distended. No significant hydronephrosis. Small exophytic low-density structure in the right kidney upper pole probably represents a cyst. Stomach/Bowel: Small hiatal hernia. Fluid and dilated loops of small bowel. Small bowel loops measure up to 3 cm. Fluid-filled colon. No distinct transition point along the bowel. Vascular/Lymphatic: Atherosclerotic calcifications in the abdominal aorta without aneurysm. The abdominal aorta is tortuous. The iliac arteries are patent. No lymph node enlargement in the abdomen or pelvis. Reproductive: Status post hysterectomy. No adnexal masses. Other: No free fluid.  Negative for free air. Musculoskeletal: Stable mild retrolisthesis at L3-L4. Old compression deformity at L1 which is stable. IMPRESSION: Mildly dilated loops of small bowel containing fluid. There is also fluid in the colon. Findings are nonspecific but could be related to an underlying enteritis. Obstructive process is thought to be less likely. Increased densities at the right lung base may represent increased volume loss and scarring compared to the previous examination. Hiatal hernia. Hepatic and renal cysts. Electronically Signed   By: Markus Daft M.D.   On: 09/08/2017 14:11   US Carotid Duplex Bilateral  Result Date: 08/19/2017 CLINICAL DATA:  83 year old female with a history of bilateral carotid stenosis. Cardiovascular risk factors include hypertension, hyperlipidemia, smoker EXAM: BILATERAL CAROTID DUPLEX ULTRASOUND  TECHNIQUE: Pearline Cables scale imaging, color Doppler and duplex ultrasound were performed of bilateral carotid and vertebral arteries in the neck. COMPARISON:  No prior duplex FINDINGS: Criteria: Quantification of carotid stenosis is  based on velocity parameters that correlate the residual internal carotid diameter with NASCET-based stenosis levels, using the diameter of the distal internal carotid lumen as the denominator for stenosis measurement. The following velocity measurements were obtained: RIGHT ICA:  Systolic 90 cm/sec, Diastolic 19 cm/sec CCA:  83 cm/sec SYSTOLIC ICA/CCA RATIO:  1.5 ECA:  60 cm/sec LEFT ICA:  Systolic 93 cm/sec, Diastolic 25 cm/sec CCA:  73 cm/sec SYSTOLIC ICA/CCA RATIO:  1.3 ECA:  118 cm/sec Right Brachial SBP: 164 Left Brachial SBP: 166 RIGHT CAROTID ARTERY: No significant calcifications of the right common carotid artery. Intermediate waveform maintained. Heterogeneous and partially calcified plaque at the right carotid bifurcation. Presence of luminal shadowing. Low resistance waveform of the right ICA. No significant tortuosity. RIGHT VERTEBRAL ARTERY: Antegrade flow with low resistance waveform. LEFT CAROTID ARTERY: No significant calcifications of the left common carotid artery. Intermediate waveform maintained. Heterogeneous and partially calcified plaque at the left carotid bifurcation with presence of lumen shadowing. Low resistance waveform of the left ICA. No significant tortuosity. LEFT VERTEBRAL ARTERY:  Antegrade flow with low resistance waveform. IMPRESSION: Color duplex indicates moderate heterogeneous and calcified plaque, with no hemodynamically significant stenosis by duplex criteria in the extracranial cerebrovascular circulation. Signed, Dulcy Fanny. Earleen Newport, DO [facility] Electronically Signed   By: Corrie Mckusick D.O.   On: 08/19/2017 15:03   US Breast Ltd Uni Left Inc Axilla  Result Date: 08/21/2017 CLINICAL DATA:  Patient describes focal pain within the outer left breast for 2 days. History of left breast cancer in 2010 status post lumpectomy and radiation therapy. EXAM: 2D DIGITAL DIAGNOSTIC BILATERAL MAMMOGRAM WITH CAD AND ADJUNCT  TOMO ULTRASOUND LEFT BREAST COMPARISON:  Previous exam(s). ACR Breast Density Category b: There are scattered areas of fibroglandular density. FINDINGS: Bilateral 2D CC and MLO views were obtained today, with additional 3D tomosynthesis, and with additional spot compression view of the outer left breast corresponding to the area of clinical concern, with overlying skin marker in place. There are no new dominant masses, suspicious calcifications or secondary signs of malignancy within either breast. Specifically, there is no mammographic abnormality within the outer left breast corresponding to the area of clinical concern. There are stable postsurgical changes within the left breast. Mammographic images were processed with CAD. Targeted ultrasound is performed, evaluating the outer left breast with particular attention to the 3 o'clock axis region as directed by the patient, showing only normal fibroglandular tissues and fat lobules. No suspicious solid or cystic mass. No skin thickening. IMPRESSION: No evidence of malignancy within either breast. Stable postsurgical changes within the left breast. RECOMMENDATION: 1.  Screening mammogram in one year.(Code:SM-B-01Y) 2. Clinical follow-up recommended for the painful area of concern in the outer left breast. Any further workup should be based on clinical grounds. Benign causes of breast pain more discussed with the patient. Patient was encouraged to follow-up with referring physician if pain worsened or if a palpable lump/mass developed. I have discussed the findings and recommendations with the patient. Results were also provided in writing at the conclusion of the visit. If applicable, a reminder letter will be sent to the patient regarding the next appointment. BI-RADS CATEGORY  2: Benign. Electronically Signed   By: Franki Cabot M.D.   On: 08/21/2017 09:22   Mm Diag  Breast Tomo Bilateral  Result Date: 08/21/2017 CLINICAL DATA:  Patient describes focal pain  within the outer left breast for 2 days. History of left breast cancer in 2010 status post lumpectomy and radiation therapy. EXAM: 2D DIGITAL DIAGNOSTIC BILATERAL MAMMOGRAM WITH CAD AND ADJUNCT TOMO ULTRASOUND LEFT BREAST COMPARISON:  Previous exam(s). ACR Breast Density Category b: There are scattered areas of fibroglandular density. FINDINGS: Bilateral 2D CC and MLO views were obtained today, with additional 3D tomosynthesis, and with additional spot compression view of the outer left breast corresponding to the area of clinical concern, with overlying skin marker in place. There are no new dominant masses, suspicious calcifications or secondary signs of malignancy within either breast. Specifically, there is no mammographic abnormality within the outer left breast corresponding to the area of clinical concern. There are stable postsurgical changes within the left breast. Mammographic images were processed with CAD. Targeted ultrasound is performed, evaluating the outer left breast with particular attention to the 3 o'clock axis region as directed by the patient, showing only normal fibroglandular tissues and fat lobules. No suspicious solid or cystic mass. No skin thickening. IMPRESSION: No evidence of malignancy within either breast. Stable postsurgical changes within the left breast. RECOMMENDATION: 1.  Screening mammogram in one year.(Code:[id number]-B-01Y) 2. Clinical follow-up recommended for the painful area of concern in the outer left breast. Any further workup should be based on clinical grounds. Benign causes of breast pain more discussed with the patient. Patient was encouraged to follow-up with referring physician if pain worsened or if a palpable lump/mass developed. I have discussed the findings and recommendations with the patient. Results were also provided in writing at the conclusion of the visit. If applicable, a reminder letter will be sent to the patient regarding the next appointment. BI-RADS  CATEGORY  2: Benign. Electronically Signed   By: Franki Cabot M.D.   On: 08/21/2017 09:22    Microbiology: Recent Results (from the past 240 hour(s))  Gastrointestinal Panel by PCR , Stool     Status: Abnormal   Collection Time: 09/08/17 12:00 PM  Result Value Ref Range Status   Campylobacter species NOT DETECTED NOT DETECTED Final   Plesimonas shigelloides NOT DETECTED NOT DETECTED Final   Salmonella species NOT DETECTED NOT DETECTED Final   Yersinia enterocolitica NOT DETECTED NOT DETECTED Final   Vibrio species NOT DETECTED NOT DETECTED Final   Vibrio cholerae NOT DETECTED NOT DETECTED Final   Enteroaggregative E coli (EAEC) NOT DETECTED NOT DETECTED Final   Enteropathogenic E coli (EPEC) NOT DETECTED NOT DETECTED Final   Enterotoxigenic E coli (ETEC) NOT DETECTED NOT DETECTED Final   Shiga like toxin producing E coli (STEC) NOT DETECTED NOT DETECTED Final   Shigella/Enteroinvasive E coli (EIEC) NOT DETECTED NOT DETECTED Final   Cryptosporidium NOT DETECTED NOT DETECTED Final   Cyclospora cayetanensis NOT DETECTED NOT DETECTED Final   Entamoeba histolytica NOT DETECTED NOT DETECTED Final   Giardia lamblia NOT DETECTED NOT DETECTED Final   Adenovirus F40/41 NOT DETECTED NOT DETECTED Final   Astrovirus NOT DETECTED NOT DETECTED Final   Norovirus GI/GII NOT DETECTED NOT DETECTED Final   Rotavirus A NOT DETECTED NOT DETECTED Final   Sapovirus (I, II, IV, and V) DETECTED (A) NOT DETECTED Final  C difficile quick scan w PCR reflex     Status: None   Collection Time: 09/08/17 12:00 PM  Result Value Ref Range Status   C Diff antigen NEGATIVE NEGATIVE Final   C Diff toxin NEGATIVE NEGATIVE Final  C Diff interpretation No C. difficile detected.  Final    Comment: Performed at [facility], [place] [address], [place], [place] [address]     Labs: Basic Metabolic Panel: Recent Labs  Lab 09/08/17 1126 09/09/17 0635 09/10/17 0605  NA 133* 140 141  K 3.7 4.3 3.9  CL 102 114*  116*  CO2 23 23 22   GLUCOSE 98 86 81  BUN 26* 17 8  CREATININE 1.19* 0.96 0.87  CALCIUM 8.8* 7.9* 7.6*   Liver Function Tests: Recent Labs  Lab 09/08/17 1126 09/09/17 0635 09/10/17 0605  AST 31 25 23   ALT 23 19 17   ALKPHOS 81 71 63  BILITOT 0.4 0.6 0.4  PROT 7.1 6.3* 5.5*  ALBUMIN 3.9 3.5 3.2*   Recent Labs  Lab 09/08/17 1126  LIPASE 27   No results for input(s): AMMONIA in the last 168 hours. CBC: Recent Labs  Lab 09/08/17 1126 09/09/17 0635 09/10/17 0605  WBC 7.1 5.6 6.1  NEUTROABS 5.2  --   --   HGB 12.9 11.8* 10.8*  HCT 39.7 37.3 34.3*  MCV 91.9 93.7 93.7  PLT 232 199 189   Cardiac Enzymes: No results for input(s): CKTOTAL, CKMB, CKMBINDEX, TROPONINI in the last 168 hours. BNP: BNP (last 3 results) No results for input(s): BNP in the last 8760 hours.  ProBNP (last 3 results) No results for input(s): PROBNP in the last 8760 hours.  CBG: Recent Labs  Lab 09/09/17 0719 09/10/17 0728  GLUCAP 76 72       Signed:  Oswald Hillock MD.  [facility] 09/10/2017, 11:46 AM

## 2017-09-10 NOTE — Progress Notes (Signed)
IV dcd. DC education reviewed with patient and daughter. Patient and daughter verbalize understanding of DC instructions.

## 2017-09-11 ENCOUNTER — Telehealth: Payer: Self-pay

## 2017-09-11 NOTE — Telephone Encounter (Signed)
09/11/17  Hospital Follow Up  Transition Care Management Follow-up Telephone Call  ADMISSION DATE: 09/08/17  DISCHARGE DATE: 09/10/17    How have you been since you were released from the hospital? Patient statea she has felt good.   Do you understand why you were in the hospital?  Yes Viral Infection   Do you understand the discharge instrcutions? Yes per patient    Items Reviewed:  Medications reviewed: Yes   Allergies reviewed: Daypro   Dietary changes reviewed: Low Sodium Heart Healthy   Referrals reviewed: Appointment scheduled with Dr. Lorelei Pont 09/17/17    Functional Questionnaire:   Activities of Daily Living (ADLs): Per patient she can perform all independently.  Any patient concerns? None at this time per patient   Confirmed importance and date/time of follow-up visits scheduled:Yes   Confirmed with patient if condition begins to worsen call PCP or go to the ER. Yes    Patient was given the office number and encouragred to call back with questions or concerns. Yes

## 2017-09-16 NOTE — Progress Notes (Addendum)
[place] [facility] [address], [place], [place] [address] [phone]  Date:  09/17/2017   Name:  [name]   DOB:  03-07-1931   MRN:  [id number]  PCP:  Darreld Mclean, MD    Chief Complaint: Hospitalization Follow-up (Pt here for hosp f/u visit. )   History of Present Illness:  [name] is a 83 y.o. very pleasant female patient who presents with the following:  Hospital follow-up visit today Admit date: 09/08/2017 Discharge date: 09/10/2017  Time spent: *35 minutes  Recommendations for Outpatient Follow-up:  1. Follow-up PCP in 2 weeks  2. Follow-up neurology in 2-3 weeks for intermittent left-sided numbness   Discharge Diagnoses:  Principal Problem:   Nausea vomiting and diarrhea Active Problems:   Breast cancer (HCC)   Osteoporosis   Diarrhea   Elevated serum creatinine   PVC (premature ventricular contraction)   Glaucoma      Filed Weights   09/08/17 1109 09/08/17 1720 09/10/17 0614  Weight: 49 kg (108 lb) 49.2 kg (108 lb 7.5 oz) 52.3 kg (115 lb 4.8 oz)    History of present illness:  83 y.o.femalewith medical history significant ofbreast cancer in remission, osteoporosis, glaucoma presenting with nausea vomiting diarrhea. Hospital Course:   1. Gastroenteritis-resolved. GI pathogenpanel showedsapovirus.Will start Imodium as needed.  Also prescribed Zofran as needed for nausea and vomiting. 2. Left facial numbness--intermittent,exacerbated by vomiting.MRI brain on 11/10 showed7 mmmeningiomainleft tentorium,as well as atrophy and mild chronic microvascular ischemia.Had recent carotid dopplers with no hemodynamically significant stenosis. Low suspicion for CVA with isolated symptoms that resolve within seconds.  Called and discussed with neurologist on call Dr Rory Percy, who reviewed the MRI images done on 08/22/2017.  He does not feel that meningioma is causing the symptoms.   As the symptoms are transient and only involves left lower jaw,He recommends follow-up with neurologist for further workup for possible trigeminal neuralgia  3. Acute kidney injury-due to dehydration. Creatinine has improved to 0.96 after IV fluids. 4. History of breast cancer-s/p lumpectomy, radiation, anastrozole. In remission without disease recurrence. Last saw Dr. Alvy Bimler in 2015.   She notes that she is feeling "much better, about back to normal." She is able to eat and drink normally She is having normal stools again No further vomiting- a few days ago she had a bit of nausea and diarrhea again, but this did resolve with zofran use Discussed the left facial numbness- she has noted this on rare occasion for 4-5 years.  It will come and go- currently not present I will place a neurology referral but we will plan to do this after the holidays per her request No fever or chills No cough or SOB No rash She is looking forward to the holidays     Patient Active Problem List   Diagnosis Date Noted  . Diarrhea 09/08/2017  . Nausea vomiting and diarrhea 09/08/2017  . Elevated serum creatinine 09/08/2017  . PVC (premature ventricular contraction) 09/08/2017  . Glaucoma 09/08/2017  . Hx of meningioma of the brain 08/28/2017  . Hoarseness of voice 02/28/2016  . Raynaud's phenomenon 02/28/2016  . Esophageal spasm 09/20/2013  . Trigger finger 07/08/2012  . Osteoporosis 03/18/2012  . Fracture, shoulder 03/06/2012  . Hyperlipidemia   . Breast cancer [facility])     Past Medical History:  Diagnosis Date  . Autoimmune disease ([place])   . Barrett's esophagus   . Bone lesion  sacrum  . Breast cancer ([place]) 2010   cT1 N0 M0; ER+/ PR+/ HER2 neg; s/p lumpectomy 07/2010; started adjuvant hormonal therapy 10/2009  . Cervical cancer ([place])   . Dysphagia   . Fracture of left shoulder   . Fx distal radius NEC-closed   . Glaucoma   . Humerus fracture 03/2012   impacted left humueral neck  fracture, treated by Dr. Amedeo Plenty  . Hyperlipidemia   . Hypertension   . Neuromuscular disorder ([place])   . Osteopenia   . Osteoporosis   . Osteoporosis 03/18/2012  . Personal history of chemotherapy 2010   Left Breast Cancer  . Personal history of radiation therapy 2010   Left Breast Cancer  . Pneumonia     Past Surgical History:  Procedure Laterality Date  . ABDOMINAL HYSTERECTOMY  1972   cancer          1 tube 1 ovary  . APPENDECTOMY    . BREAST LUMPECTOMY Left 2010  . colon polyp removal    . EYE SURGERY    . HAND SURGERY    . ortho procedures     multiple  . OVARIAN CYST REMOVAL  1978  . SHOULDER SURGERY  1954   left    Social History   Tobacco Use  . Smoking status: Former Smoker    Last attempt to quit: 12/16/1986    Years since quitting: 30.7  . Smokeless tobacco: Never Used  . Tobacco comment: FORMER SMOKER 30 YEARS AGO  Substance Use Topics  . Alcohol use: Yes    Alcohol/week: 0.0 oz    Comment: OCCASIONALLY  . Drug use: No    Family History  Problem Relation Age of Onset  . Heart disease Father   . Cancer Sister        lung, kidney  . Cancer Brother        brain  . Cancer Paternal Aunt        breast  . Breast cancer Paternal Aunt   . Cancer Brother        liver, lung  . Osteoporosis Neg Hx     Allergies  Allergen Reactions  . Daypro [Oxaprozin] Swelling    Face and tongue    Medication list has been reviewed and updated.  Current Outpatient Medications on File Prior to Visit  Medication Sig Dispense Refill  . Calcium Carbonate-Vitamin D (CALCIUM + D PO) Take 1 capsule by mouth 2 (two) times daily. Taking 2 pills per day    . latanoprost (XALATAN) 0.005 % ophthalmic solution Place 1 drop into both eyes at bedtime.     Marland Kitchen loperamide (IMODIUM) 2 MG capsule Take 1 capsule (2 mg total) by mouth every 6 (six) hours as needed for diarrhea or loose stools. 30 capsule 0  . Multiple Vitamin (MULTIVITAMIN) capsule Take 1 capsule by mouth daily.    .  ondansetron (ZOFRAN) 4 MG tablet Take 1 tablet (4 mg total) by mouth every 6 (six) hours as needed for nausea. 20 tablet 0  . rosuvastatin (CRESTOR) 10 MG tablet TAKE 1 BY MOUTH DAILY 90 tablet 3   No current facility-administered medications on file prior to visit.     Review of Systems:  As per HPI- otherwise negative.   Physical Examination: Vitals:   09/17/17 1136  BP: 132/82  Pulse: (!) 59  Temp: 97.6 F (36.4 C)  SpO2: 99%   Vitals:   09/17/17 1136  Weight: 112 lb 6.4 oz (51 kg)   Body  mass index is 19.29 kg/m. Ideal Body Weight:    GEN: WDWN, NAD, Non-toxic, A & O x 3, petite build, looks well HEENT: Atraumatic, Normocephalic. Neck supple. No masses, No LAD.  Bilateral TM wnl, oropharynx normal.  PEERL,EOMI.   Ears and Nose: No external deformity.   CV: RRR, No M/G/R. No JVD. No thrill. No extra heart sounds. PULM: CTA B, no wheezes, crackles, rhonchi. No retractions. No resp. distress. No accessory muscle use. ABD: S, NT, ND, +BS. No rebound. No HSM. Benign belly EXTR: No c/c/e NEURO Normal gait.  PSYCH: Normally interactive. Conversant. Not depressed or anxious appearing.  Calm demeanor.    Assessment and Plan: Hypocalcemia - Plan: Basic metabolic panel, CBC  Vomiting and diarrhea - Plan: CBC  Left facial numbness - Plan: Ambulatory referral to Neurology  Here today to follow-up from recent viral gastroenteritis She is feeling much better Mild abnl of CBC and BMP noted while inpt. Will recheck today Placed neurology referral  Signed Lamar Blinks, MD Received her labs 12/7  Results for orders placed or performed in visit on 59/16/38  Basic metabolic panel  Result Value Ref Range   Sodium 139 135 - 145 mEq/L   Potassium 4.5 3.5 - 5.1 mEq/L   Chloride 102 96 - 112 mEq/L   CO2 30 19 - 32 mEq/L   Glucose, Bld 75 70 - 99 mg/dL   BUN 15 6 - 23 mg/dL   Creatinine, Ser 0.98 0.40 - 1.20 mg/dL   Calcium 9.5 8.4 - 10.5 mg/dL   GFR 57.41 (L) >60.00  mL/min  CBC  Result Value Ref Range   WBC 7.3 4.0 - 10.5 K/uL   RBC 3.98 3.87 - 5.11 Mil/uL   Platelets 251.0 150.0 - 400.0 K/uL   Hemoglobin 11.8 (L) 12.0 - 15.0 g/dL   HCT 36.5 36.0 - 46.0 %   MCV 91.5 78.0 - 100.0 fl   MCHC 32.5 30.0 - 36.0 g/dL   RDW 15.5 11.5 - 15.5 %

## 2017-09-17 ENCOUNTER — Ambulatory Visit: Payer: Medicare Other | Admitting: Family Medicine

## 2017-09-17 DIAGNOSIS — R200 Anesthesia of skin: Secondary | ICD-10-CM

## 2017-09-17 DIAGNOSIS — R1110 Vomiting, unspecified: Secondary | ICD-10-CM | POA: Diagnosis not present

## 2017-09-17 DIAGNOSIS — R197 Diarrhea, unspecified: Secondary | ICD-10-CM | POA: Diagnosis not present

## 2017-09-17 LAB — CBC
HCT: 36.5 % (ref 36.0–46.0)
Hemoglobin: 11.8 g/dL — ABNORMAL LOW (ref 12.0–15.0)
MCHC: 32.5 g/dL (ref 30.0–36.0)
MCV: 91.5 fl (ref 78.0–100.0)
Platelets: 251 10*3/uL (ref 150.0–400.0)
RBC: 3.98 Mil/uL (ref 3.87–5.11)
RDW: 15.5 % (ref 11.5–15.5)
WBC: 7.3 10*3/uL (ref 4.0–10.5)

## 2017-09-17 LAB — BASIC METABOLIC PANEL
BUN: 15 mg/dL (ref 6–23)
CO2: 30 mEq/L (ref 19–32)
Calcium: 9.5 mg/dL (ref 8.4–10.5)
Chloride: 102 mEq/L (ref 96–112)
Creatinine, Ser: 0.98 mg/dL (ref 0.40–1.20)
GFR: 57.41 mL/min — ABNORMAL LOW (ref >60.00)
Glucose, Bld: 75 mg/dL (ref 70–99)
Potassium: 4.5 mEq/L (ref 3.5–5.1)
Sodium: 139 mEq/L (ref 135–145)

## 2017-09-17 NOTE — Patient Instructions (Signed)
It was a pleasure to see you- so glad that you are feeling better!   I will be in touch with your labs Will place a referral to neurology for 2019 Let me know if any other problems or concerns

## 2017-09-24 ENCOUNTER — Encounter: Payer: Self-pay | Admitting: Neurology

## 2017-09-25 DIAGNOSIS — Z961 Presence of intraocular lens: Secondary | ICD-10-CM | POA: Diagnosis not present

## 2017-09-25 DIAGNOSIS — H02821 Cysts of right upper eyelid: Secondary | ICD-10-CM | POA: Diagnosis not present

## 2017-09-25 DIAGNOSIS — H02822 Cysts of right lower eyelid: Secondary | ICD-10-CM | POA: Diagnosis not present

## 2017-09-25 DIAGNOSIS — H401133 Primary open-angle glaucoma, bilateral, severe stage: Secondary | ICD-10-CM | POA: Diagnosis not present

## 2017-10-14 ENCOUNTER — Encounter: Payer: Self-pay | Admitting: Neurology

## 2017-10-14 ENCOUNTER — Telehealth: Payer: Self-pay | Admitting: *Deleted

## 2017-10-14 ENCOUNTER — Ambulatory Visit: Payer: Medicare Other | Admitting: Neurology

## 2017-10-14 ENCOUNTER — Other Ambulatory Visit (INDEPENDENT_AMBULATORY_CARE_PROVIDER_SITE_OTHER): Payer: Medicare Other

## 2017-10-14 ENCOUNTER — Other Ambulatory Visit: Payer: Self-pay | Admitting: *Deleted

## 2017-10-14 VITALS — BP 104/70 | HR 56 | Ht 62.0 in | Wt 111.1 lb

## 2017-10-14 DIAGNOSIS — R200 Anesthesia of skin: Secondary | ICD-10-CM

## 2017-10-14 DIAGNOSIS — R7989 Other specified abnormal findings of blood chemistry: Secondary | ICD-10-CM

## 2017-10-14 LAB — VITAMIN B12: Vitamin B-12: 829 pg/mL (ref 211–911)

## 2017-10-14 LAB — TSH: TSH: 6.38 u[IU]/mL — ABNORMAL HIGH (ref 0.35–4.50)

## 2017-10-14 LAB — T3, FREE: T3, Free: 2.9 pg/mL (ref 2.3–4.2)

## 2017-10-14 LAB — T4, FREE: Free T4: 0.83 ng/dL (ref 0.60–1.60)

## 2017-10-14 NOTE — Telephone Encounter (Signed)
-----   Message from Alda Berthold, DO sent at 10/14/2017 12:24 PM EST ----- Please see if [name] can add free T3 and free T4 to her labs.  If not, let her know I want to check additional labs for her thyroid and she will need to have this redrawn.  Vitamin B12 is normal  Thanks.

## 2017-10-14 NOTE — Progress Notes (Signed)
[facility] Clinic Note - Initial Visit   Date: 10/14/17  [name] MRN: [id number] DOB: 01/02/1931   Dear Dr. Lorelei Pont:  Thank you for your kind referral of [name] for consultation of left facial numbness. Although her history is well known to you, please allow [place] to reiterate it for the purpose of our medical record. The patient was accompanied to the clinic by self.    History of Present Illness: [name] is a 84 y.o. right-handed Caucasian female with hyperlipidemia, history of breast cancer s/p lumpectomy/radiation (2015), and osteoporosis presenting for evaluation of intermittent left facial numbness.  She is very active and walks anywhere 6-8 miles per day for many years.    Starting around 2014, she began having episodic numbness of the left temporal, cheek, and jaw.  Symptoms usually last a few minutes and self-resolve.  Usually this occurs about 1-2 times per year.  No specific triggers, but feels that stress may exacerbate it.  In November 2018, she was hopsitalized for severe GI illness and during this time, she was vomiting a lot and her numbness was more intense and longer lasting. She had MRI brain which showed stable left tentorium meningioma, no mass effect or other structural changes.  She continues to have very mild numbness of the left face which has been constant since November.  She denies any weakness, shooting electrical pain, headaches, or vision changes.  She denies any numbness of the hands or legs.    No history of alcohol use, diabetes, or thyroid problems.  Out-side paper records, electronic medical record, and images have been reviewed where available and summarized as:  MRI brain wwo contrast 08/22/2017:  Mild atrophy and mild chronic microvascular ischemia.  7 mm calcified meningioma left tentorium unchanged 2015.  Lab Results  Component Value Date   TSH 5.07 (H) 03/15/2015     Past Medical History:    Diagnosis Date  . Autoimmune disease ([place])   . Barrett's esophagus   . Bone lesion    sacrum  . Breast cancer ([place]) 2010   cT1 N0 M0; ER+/ PR+/ HER2 neg; s/p lumpectomy 07/2010; started adjuvant hormonal therapy 10/2009  . Cervical cancer ([place])   . Dysphagia   . Fracture of left shoulder   . Fx distal radius NEC-closed   . Glaucoma   . Humerus fracture 03/2012   impacted left humueral neck fracture, treated by Dr. Amedeo Plenty  . Hyperlipidemia   . Hypertension   . Neuromuscular disorder ([place])   . Osteopenia   . Osteoporosis   . Osteoporosis 03/18/2012  . Personal history of chemotherapy 2010   Left Breast Cancer  . Personal history of radiation therapy 2010   Left Breast Cancer  . Pneumonia     Past Surgical History:  Procedure Laterality Date  . ABDOMINAL HYSTERECTOMY  1972   cancer          1 tube 1 ovary  . APPENDECTOMY    . BREAST LUMPECTOMY Left 2010  . colon polyp removal    . EYE SURGERY    . HAND SURGERY    . ortho procedures     multiple  . OVARIAN CYST REMOVAL  1978  . SHOULDER SURGERY  1954   left     Medications:  Outpatient Encounter Medications as of 10/14/2017  Medication Sig  . Calcium Carbonate-Vitamin D (CALCIUM + D PO) Take 1 capsule by mouth 2 (two) times daily. Taking 2 pills per day  .  latanoprost (XALATAN) 0.005 % ophthalmic solution Place 1 drop into both eyes at bedtime.   Marland Kitchen loperamide (IMODIUM) 2 MG capsule Take 1 capsule (2 mg total) by mouth every 6 (six) hours as needed for diarrhea or loose stools.  . Multiple Vitamin (MULTIVITAMIN) capsule Take 1 capsule by mouth daily.  . ondansetron (ZOFRAN) 4 MG tablet Take 1 tablet (4 mg total) by mouth every 6 (six) hours as needed for nausea.  . rosuvastatin (CRESTOR) 10 MG tablet TAKE 1 BY MOUTH DAILY   No facility-administered encounter medications on file as of 10/14/2017.      Allergies:  Allergies  Allergen Reactions  . Daypro [Oxaprozin] Swelling    Face and tongue    Family  History: Family History  Problem Relation Age of Onset  . Heart disease Father   . Cancer Sister        lung, kidney  . Cancer Brother        brain  . Cancer Paternal Aunt        breast  . Breast cancer Paternal Aunt   . Cancer Brother        liver, lung  . Osteoporosis Neg Hx     Social History: Social History   Tobacco Use  . Smoking status: Former Smoker    Last attempt to quit: 12/16/1986    Years since quitting: 30.8  . Smokeless tobacco: Never Used  . Tobacco comment: FORMER SMOKER 30 YEARS AGO  Substance Use Topics  . Alcohol use: Yes    Alcohol/week: 0.0 oz    Comment: OCCASIONALLY  . Drug use: No   Social History   Social History Narrative   Married   Exercise: Yes   Patient lives with husband in a one story home.  Has 2 daughters.  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  Education: high school.      Review of Systems:  CONSTITUTIONAL: No fevers, chills, night sweats, or weight loss.   EYES: No visual changes or eye pain ENT: No hearing changes.  No history of nose bleeds.   RESPIRATORY: No cough, wheezing and shortness of breath.   CARDIOVASCULAR: Negative for chest pain, and palpitations.   GI: Negative for abdominal discomfort, blood in stools or black stools.  No recent change in bowel habits.   GU:  No history of incontinence.   MUSCLOSKELETAL: No history of joint pain or swelling.  No myalgias.   SKIN: Negative for lesions, rash, and itching.   HEMATOLOGY/ONCOLOGY: Negative for prolonged bleeding, bruising easily, and swollen nodes.  +history of cancer.   ENDOCRINE: Negative for cold or heat intolerance, polydipsia or goiter.   PSYCH:  No depression or anxiety symptoms.   NEURO: As Above.   Vital Signs:  BP 104/70   Pulse (!) 56   Ht '5\' 2"'$  (1.575 m)   Wt 111 lb 2 oz (50.4 kg)   SpO2 99%   BMI 20.33 kg/m    General Medical Exam:   General:  Well appearing, comfortable.   Eyes/ENT: see cranial nerve examination.   Neck: No masses appreciated.   Full range of motion without tenderness.  No carotid bruits. Respiratory:  Clear to auscultation, good air entry bilaterally.   Cardiac:  Regular rate and rhythm, no murmur.   Extremities:  No deformities, edema, or skin discoloration.  Skin:  No rashes or lesions.  Neurological Exam: MENTAL STATUS including orientation to time, place, person, recent and remote memory, attention span and concentration, language, and fund of  knowledge is normal.  Speech is not dysarthric.  CRANIAL NERVES: II:  No visual field defects.  Unremarkable fundi.   III-IV-VI: Pupils equal round and reactive to light.  Normal conjugate, extra-ocular eye movements in all directions of gaze.  No nystagmus.  No ptosis.   V:  Mildly reduced temperature only over the left V1-V3 region, pin prick and light touch intact.  Jaw jerk is absent.   VII:  Normal facial symmetry and movements.  No pathologic facial reflexes.  VIII:  Normal hearing and vestibular function.   IX-X:  Normal palatal movement.   XI:  Normal shoulder shrug and head rotation.   XII:  Normal tongue strength and range of motion, no deviation or fasciculation.  MOTOR:  Motor strength is 5/5 throughout.  No atrophy, fasciculations or abnormal movements.  No pronator drift.  Tone is normal.    MSRs:  Right                                                                 Left brachioradialis 2+  brachioradialis 2+  biceps 2+  biceps 2+  triceps 2+  triceps 2+  patellar 2+  patellar 2+  ankle jerk 1+  ankle jerk 1+  Hoffman no  Hoffman no  plantar response down  plantar response down   SENSORY:  Normal and symmetric perception of light touch, pinprick, vibration, and proprioception.  Romberg's sign absent.   COORDINATION/GAIT: Normal finger-to- nose-finger and heel-to-shin.  Intact rapid alternating movements bilaterally.  Able to rise from a chair without using arms.  Gait narrow based and stable. Tandem and stressed gait intact.    IMPRESSION: Mrs.  Ayler is a delightful 84 year-old female referred for left facial numbness.  She has symptoms ongoing for the past 4-5 years with transient numbness lasting a few minutes, occurring 1-2 times per year; however, after her recent viral GI illness in November, numbness has become more constant.  Her neurological exam shows mildly reduced temperature on the left V1-V3 region, with intact light touch and pin prick.  There are no other cranial nerve involvement and symptoms are not typical for trigemina neuralgia which manifests with severe lancinating pain.  MRI brain did not show any structural pathology affecting the trigeminal nerve.  Left tentorium meningioma is stable and would not manifest with these symptoms. Patient was reassured that I do not see anything worrisome on her exam or imaging and since symptoms are slowly improving, would recommend that clinically following.  To be complete, vitamin B12 and TSH will be checked.  If symptoms do not improve to get worse, she knows to return to see me again.    Thank you for allowing me to participate in patient's care.  If I can answer any additional questions, I would be pleased to do so.    Sincerely,    Kissie Ziolkowski K. Posey Pronto, DO

## 2017-10-14 NOTE — Telephone Encounter (Signed)
Order placed for free T3 and T4.  [name] will fax the request.

## 2017-10-14 NOTE — Patient Instructions (Signed)
1.  Check thyroid and vitamin B12 levels 2.  If your numbness gets worse, please come back and see me

## 2017-10-15 ENCOUNTER — Telehealth: Payer: Self-pay | Admitting: *Deleted

## 2017-10-15 DIAGNOSIS — Z961 Presence of intraocular lens: Secondary | ICD-10-CM | POA: Diagnosis not present

## 2017-10-15 DIAGNOSIS — D487 Neoplasm of uncertain behavior of other specified sites: Secondary | ICD-10-CM | POA: Diagnosis not present

## 2017-10-15 DIAGNOSIS — Z83518 Family history of other specified eye disorder: Secondary | ICD-10-CM | POA: Diagnosis not present

## 2017-10-15 DIAGNOSIS — Z83511 Family history of glaucoma: Secondary | ICD-10-CM | POA: Diagnosis not present

## 2017-10-15 NOTE — Telephone Encounter (Signed)
-----   Message from Alda Berthold, DO sent at 10/14/2017  3:57 PM EST ----- Please notify patient lab are within normal limits.  Thank you.

## 2017-10-15 NOTE — Telephone Encounter (Signed)
Left message for patient that her labs came back normal.

## 2017-10-30 DIAGNOSIS — H02821 Cysts of right upper eyelid: Secondary | ICD-10-CM | POA: Diagnosis not present

## 2017-10-30 DIAGNOSIS — H02822 Cysts of right lower eyelid: Secondary | ICD-10-CM | POA: Diagnosis not present

## 2017-10-30 DIAGNOSIS — H401133 Primary open-angle glaucoma, bilateral, severe stage: Secondary | ICD-10-CM | POA: Diagnosis not present

## 2017-11-17 DIAGNOSIS — L570 Actinic keratosis: Secondary | ICD-10-CM | POA: Diagnosis not present

## 2017-11-17 DIAGNOSIS — Z85828 Personal history of other malignant neoplasm of skin: Secondary | ICD-10-CM | POA: Diagnosis not present

## 2017-11-17 DIAGNOSIS — L821 Other seborrheic keratosis: Secondary | ICD-10-CM | POA: Diagnosis not present

## 2017-11-17 DIAGNOSIS — D225 Melanocytic nevi of trunk: Secondary | ICD-10-CM | POA: Diagnosis not present

## 2018-02-14 NOTE — Progress Notes (Addendum)
[facility] [facility] [place], [place], [place] [address] [phone]  Date:  02/17/2018   Name:  [name]   DOB:  August 19, 1931   MRN:  [id number]  PCP:  Darreld Mclean, MD    Chief Complaint: Follow-up (Pt here for follow up visit. )   History of Present Illness:  [name] is a 84 y.o. very pleasant female patient who presents with the following:  Periodic follow-up today Last seen by myself in December to follow-up gastroenteritis and brief hospital admission for same- she has not had any recurrence of these sx  History of glaucoma, meningioma, osteoporosis, hyperlipidemia, and breast cancer 2010 She is an avid walker- she will go out when she gets home after her visit today  She is still doing 5.5 or more miles a day- her goal is 8- 10k steps a day Married to [place]  Her eyes are doing ok, she is continuing her drops. She does to [facility] for glaucoma care Needs a refill of her cholesterol meds today Last bone density not quite 2 years ago    She did cut the back of her left leg on a fallen tree limb a couple of weeks ago - this seems to be healing up ok but she wanted to make sure that all is well Tetanus is UTD Has not had shingrix yet but is trying to get this shot- it is hard to find!  Neurology checked a TSH for her in January and it was high- I will repeat today  Wt Readings from Last 3 Encounters:  02/17/18 108 lb 9.6 oz (49.3 kg)  10/14/17 111 lb 2 oz (50.4 kg)  09/17/17 112 lb 6.4 oz (51 kg)    Patient Active Problem List   Diagnosis Date Noted  . Diarrhea 09/08/2017  . Nausea vomiting and diarrhea 09/08/2017  . Elevated serum creatinine 09/08/2017  . PVC (premature ventricular contraction) 09/08/2017  . Glaucoma 09/08/2017  . Hx of meningioma of the brain 08/28/2017  . Hoarseness of voice 02/28/2016  . Raynaud's phenomenon 02/28/2016  . Esophageal spasm 09/20/2013  . Trigger finger  07/08/2012  . Osteoporosis 03/18/2012  . Fracture, shoulder 03/06/2012  . Hyperlipidemia   . Breast cancer [facility])     Past Medical History:  Diagnosis Date  . Autoimmune disease ([place])   . Barrett's esophagus   . Bone lesion    sacrum  . Breast cancer ([place]) 2010   cT1 N0 M0; ER+/ PR+/ HER2 neg; s/p lumpectomy 07/2010; started adjuvant hormonal therapy 10/2009  . Cervical cancer ([place])   . Dysphagia   . Fracture of left shoulder   . Fx distal radius NEC-closed   . Glaucoma   . Humerus fracture 03/2012   impacted left humueral neck fracture, treated by Dr. Amedeo Plenty  . Hyperlipidemia   . Hypertension   . Neuromuscular disorder ([place])   . Osteopenia   . Osteoporosis   . Osteoporosis 03/18/2012  . Personal history of chemotherapy 2010   Left Breast Cancer  . Personal history of radiation therapy 2010   Left Breast Cancer  . Pneumonia     Past Surgical History:  Procedure Laterality Date  . ABDOMINAL HYSTERECTOMY  1972   cancer          1 tube 1 ovary  . APPENDECTOMY    . BREAST LUMPECTOMY Left 2010  . colon polyp removal    . EYE  SURGERY    . HAND SURGERY    . ortho procedures     multiple  . OVARIAN CYST REMOVAL  1978  . SHOULDER SURGERY  1954   left    Social History   Tobacco Use  . Smoking status: Former Smoker    Last attempt to quit: 12/16/1986    Years since quitting: 31.1  . Smokeless tobacco: Never Used  . Tobacco comment: FORMER SMOKER 30 YEARS AGO  Substance Use Topics  . Alcohol use: Yes    Alcohol/week: 0.0 oz    Comment: OCCASIONALLY  . Drug use: No    Family History  Problem Relation Age of Onset  . Heart disease Father   . Cancer Sister        lung, kidney  . Cancer Brother        brain  . Cancer Paternal Aunt        breast  . Breast cancer Paternal Aunt   . Cancer Brother        liver, lung  . Osteoporosis Neg Hx     Allergies  Allergen Reactions  . Daypro [Oxaprozin] Swelling    Face and tongue    Medication list has been  reviewed and updated.  Current Outpatient Medications on File Prior to Visit  Medication Sig Dispense Refill  . Calcium Carbonate-Vitamin D (CALCIUM + D PO) Take 1 capsule by mouth 2 (two) times daily. Taking 2 pills per day    . latanoprost (XALATAN) 0.005 % ophthalmic solution Place 1 drop into both eyes at bedtime.     . Multiple Vitamin (MULTIVITAMIN) capsule Take 1 capsule by mouth daily.    . rosuvastatin (CRESTOR) 10 MG tablet TAKE 1 BY MOUTH DAILY 90 tablet 3   No current facility-administered medications on file prior to visit.     Review of Systems:  As per HPI- otherwise negative. No fever or chills No CP or SOB   Physical Examination: Vitals:   02/17/18 0921  BP: 126/84  Pulse: 60  Temp: (!) 97.5 F (36.4 C)  SpO2: 99%   Vitals:   02/17/18 0921  Weight: 108 lb 9.6 oz (49.3 kg)  Height: _0  (1.575 m)   Body mass index is 19.86 kg/m. Ideal Body Weight: Weight in (lb) to have BMI = 25: 136.4  GEN: WDWN, NAD, Non-toxic, A & O x 3, looks well, very spry and vibrant for age 62: Atraumatic, Normocephalic. Neck supple. No masses, No LAD.  Bilateral TM wnl, oropharynx normal.  PEERL,EOMI.   Ears and Nose: No external deformity. CV: RRR, No M/G/R. No JVD. No thrill. No extra heart sounds. PULM: CTA B, no wheezes, crackles, rhonchi. No retractions. No resp. distress. No accessory muscle use. EXTR: No c/c/e NEURO Normal gait.  PSYCH: Normally interactive. Conversant. Not depressed or anxious appearing.  Calm demeanor.  Healing abrasion on back of left leg- does not appear to be infected and is healing well   Assessment and Plan: Abnormal TSH - Plan: TSH  Mixed hyperlipidemia - Plan: rosuvastatin (CRESTOR) 10 MG tablet, Lipid panel  Mild anemia - Plan: CBC  Wound of left lower extremity, initial encounter  Medication monitoring encounter - Plan: CBC, Basic metabolic panel  Following up today Ordered labs for her today to check her lipids,mild anemia,  renal function Discussed signs of infection to watch for with her leg Will plan further follow- up pending labs.   Signed Lamar Blinks, MD  Received her labs, called pt re:  TSH high again. No answer. Will try her back later   Called pt 5/9- not at home  Called on 5/11- no answer Called on 5/13 and reached her- she would like to use thyroid replacement - will rx 25 mcg for her and she will come in for a repeat TSH in one month  Results for orders placed or performed in visit on 02/17/18  CBC  Result Value Ref Range   WBC 7.6 4.0 - 10.5 K/uL   RBC 4.30 3.87 - 5.11 Mil/uL   Platelets 247.0 150.0 - 400.0 K/uL   Hemoglobin 12.8 12.0 - 15.0 g/dL   HCT 38.9 36.0 - 46.0 %   MCV 90.6 78.0 - 100.0 fl   MCHC 33.0 30.0 - 36.0 g/dL   RDW 15.6 (H) 11.5 - 15.5 %  TSH  Result Value Ref Range   TSH 5.42 (H) 0.35 - 4.50 uIU/mL  Lipid panel  Result Value Ref Range   Cholesterol 176 0 - 200 mg/dL   Triglycerides 68.0 0.0 - 149.0 mg/dL   HDL 79.60 >39.00 mg/dL   VLDL 13.6 0.0 - 40.0 mg/dL   LDL Cholesterol 83 0 - 99 mg/dL   Total CHOL/HDL Ratio 2    NonHDL 15.52   Basic metabolic panel  Result Value Ref Range   Sodium 138 135 - 145 mEq/L   Potassium 5.0 3.5 - 5.1 mEq/L   Chloride 102 96 - 112 mEq/L   CO2 28 19 - 32 mEq/L   Glucose, Bld 91 70 - 99 mg/dL   BUN 24 (H) 6 - 23 mg/dL   Creatinine, Ser 1.08 0.40 - 1.20 mg/dL   Calcium 10.0 8.4 - 10.5 mg/dL   GFR 51.27 (L) >60.00 mL/min

## 2018-02-17 ENCOUNTER — Ambulatory Visit: Payer: Medicare Other | Admitting: Family Medicine

## 2018-02-17 ENCOUNTER — Encounter: Payer: Self-pay | Admitting: Family Medicine

## 2018-02-17 VITALS — BP 126/84 | HR 60 | Temp 97.5°F | Ht 62.0 in | Wt 108.6 lb

## 2018-02-17 DIAGNOSIS — E782 Mixed hyperlipidemia: Secondary | ICD-10-CM

## 2018-02-17 DIAGNOSIS — Z5181 Encounter for therapeutic drug level monitoring: Secondary | ICD-10-CM | POA: Diagnosis not present

## 2018-02-17 DIAGNOSIS — S81802A Unspecified open wound, left lower leg, initial encounter: Secondary | ICD-10-CM | POA: Diagnosis not present

## 2018-02-17 DIAGNOSIS — E039 Hypothyroidism, unspecified: Secondary | ICD-10-CM

## 2018-02-17 DIAGNOSIS — D649 Anemia, unspecified: Secondary | ICD-10-CM | POA: Diagnosis not present

## 2018-02-17 DIAGNOSIS — R7989 Other specified abnormal findings of blood chemistry: Secondary | ICD-10-CM | POA: Diagnosis not present

## 2018-02-17 LAB — LIPID PANEL
Cholesterol: 176 mg/dL (ref 0–200)
HDL: 79.6 mg/dL (ref >39.00)
LDL Cholesterol: 83 mg/dL (ref 0–99)
NonHDL: 96.41
Total CHOL/HDL Ratio: 2
Triglycerides: 68 mg/dL (ref 0.0–149.0)
VLDL: 13.6 mg/dL (ref 0.0–40.0)

## 2018-02-17 LAB — CBC
HCT: 38.9 % (ref 36.0–46.0)
Hemoglobin: 12.8 g/dL (ref 12.0–15.0)
MCHC: 33 g/dL (ref 30.0–36.0)
MCV: 90.6 fl (ref 78.0–100.0)
Platelets: 247 10*3/uL (ref 150.0–400.0)
RBC: 4.3 Mil/uL (ref 3.87–5.11)
RDW: 15.6 % — ABNORMAL HIGH (ref 11.5–15.5)
WBC: 7.6 10*3/uL (ref 4.0–10.5)

## 2018-02-17 LAB — BASIC METABOLIC PANEL
BUN: 24 mg/dL — ABNORMAL HIGH (ref 6–23)
CO2: 28 mEq/L (ref 19–32)
Calcium: 10 mg/dL (ref 8.4–10.5)
Chloride: 102 mEq/L (ref 96–112)
Creatinine, Ser: 1.08 mg/dL (ref 0.40–1.20)
GFR: 51.27 mL/min — ABNORMAL LOW (ref >60.00)
Glucose, Bld: 91 mg/dL (ref 70–99)
Potassium: 5 mEq/L (ref 3.5–5.1)
Sodium: 138 mEq/L (ref 135–145)

## 2018-02-17 LAB — TSH: TSH: 5.42 u[IU]/mL — ABNORMAL HIGH (ref 0.35–4.50)

## 2018-02-17 MED ORDER — ROSUVASTATIN CALCIUM 10 MG PO TABS: ORAL_TABLET | ORAL | 3 refills | Status: DC

## 2018-02-17 NOTE — Patient Instructions (Signed)
Great to see you today as always!  Take care and let me know if you need anything- otherwise we can plan to visit in 6 months I think the wound on your leg looks fine, but continue to monitor and alert me if any heat, redness, swelling, pus, or increased pain I will be in touch with your labs asap

## 2018-02-22 MED ORDER — LEVOTHYROXINE SODIUM 25 MCG PO TABS: 25.0000 ug | ORAL_TABLET | Freq: Every day | ORAL | 1 refills | Status: DC

## 2018-02-22 NOTE — Addendum Note (Signed)
Addended by: Lamar Blinks C on: 02/22/2018 05:01 PM   Modules accepted: Orders

## 2018-02-23 ENCOUNTER — Telehealth: Payer: Self-pay

## 2018-02-23 NOTE — Telephone Encounter (Signed)
Copied from [place] [id number] [id number]. Topic: Quick Communication - Lab Results >> Feb 22, 2018  1:30 PM Scherrie Gerlach wrote: Pt returning Dr Lorelei Pont phone call about results

## 2018-02-23 NOTE — Telephone Encounter (Signed)
Letter has been mailed to patient with results.

## 2018-03-05 DIAGNOSIS — C44329 Squamous cell carcinoma of skin of other parts of face: Secondary | ICD-10-CM | POA: Diagnosis not present

## 2018-03-05 DIAGNOSIS — L570 Actinic keratosis: Secondary | ICD-10-CM | POA: Diagnosis not present

## 2018-03-05 DIAGNOSIS — D485 Neoplasm of uncertain behavior of skin: Secondary | ICD-10-CM | POA: Diagnosis not present

## 2018-03-05 DIAGNOSIS — Z85828 Personal history of other malignant neoplasm of skin: Secondary | ICD-10-CM | POA: Diagnosis not present

## 2018-03-29 ENCOUNTER — Other Ambulatory Visit (INDEPENDENT_AMBULATORY_CARE_PROVIDER_SITE_OTHER): Payer: Medicare Other

## 2018-03-29 DIAGNOSIS — E039 Hypothyroidism, unspecified: Secondary | ICD-10-CM | POA: Diagnosis not present

## 2018-03-29 LAB — TSH: TSH: 5.2 u[IU]/mL — ABNORMAL HIGH (ref 0.35–4.50)

## 2018-04-01 ENCOUNTER — Telehealth: Payer: Self-pay | Admitting: Family Medicine

## 2018-04-01 DIAGNOSIS — Z85828 Personal history of other malignant neoplasm of skin: Secondary | ICD-10-CM | POA: Diagnosis not present

## 2018-04-01 DIAGNOSIS — C44329 Squamous cell carcinoma of skin of other parts of face: Secondary | ICD-10-CM | POA: Diagnosis not present

## 2018-04-01 DIAGNOSIS — E039 Hypothyroidism, unspecified: Secondary | ICD-10-CM

## 2018-04-01 MED ORDER — LEVOTHYROXINE SODIUM 50 MCG PO TABS: 50.0000 ug | ORAL_TABLET | Freq: Every day | ORAL | 4 refills | Status: DC

## 2018-04-01 NOTE — Telephone Encounter (Signed)
Called and spoke with her/ will increase synthroid from 25 to 50 mcg, and repeat TSH in 4-6 weeks  Lab Results  Component Value Date   TSH 5.20 (H) 03/29/2018

## 2018-04-30 DIAGNOSIS — H02822 Cysts of right lower eyelid: Secondary | ICD-10-CM | POA: Diagnosis not present

## 2018-04-30 DIAGNOSIS — H401133 Primary open-angle glaucoma, bilateral, severe stage: Secondary | ICD-10-CM | POA: Diagnosis not present

## 2018-04-30 DIAGNOSIS — H02821 Cysts of right upper eyelid: Secondary | ICD-10-CM | POA: Diagnosis not present

## 2018-04-30 DIAGNOSIS — Z961 Presence of intraocular lens: Secondary | ICD-10-CM | POA: Diagnosis not present

## 2018-05-11 NOTE — Progress Notes (Signed)
[place] at [facility] [place], [place], [place] [address] [phone]  Date:  05/13/2018   Name:  [name]   DOB:  [birth year]   MRN:  [id number]  PCP:  Darreld Mclean, MD    Chief Complaint: Rash (left hip, 5 weeks, possible insect bite?, itching, redness, rough skin)   History of Present Illness:  [name] is a 84 y.o. very pleasant female patient who presents with the following:  Here today with concern of a rash on her LEFT hip  I last saw her in May- History of glaucoma, meningioma, osteoporosis, hyperlipidemia, and breast cancer 2010 She is an avid walker- she will go out when she gets home after her visit today  She is still doing 5.5 or more miles a day- her goal is 8- 10k steps a day Married to [place]  Her eyes are doing ok, she is continuing her drops. She does to [facility] for glaucoma care  About 5 weeks ago she had the feeling that something had stung or bit her left hip. It was itchy, and is still itchy now.  Never painful. However she has developed a rough, scaly patch on the hip that has gotten bigger and worse.   It has not gotten better despite her applying triamcinolone to the area   We also are due to check her TSH today- was high at 5.2 so we increased her synthroid from 25- 50 mcg.   Otherwise she is feeling well, no fever, still walking like she normally does for exercise   She did have the zostavax immunization in the past, has not been able to get shingrix yet  She reports that the area on her hip has felt kind of strange and tingly, although not painful.  Atypical shingles rash may be possible   No other areas of rash on her body  Lab Results  Component Value Date   TSH 5.20 (H) 03/29/2018    Patient Active Problem List   Diagnosis Date Noted  . Diarrhea 09/08/2017  . Nausea vomiting and diarrhea 09/08/2017  . Elevated serum creatinine 09/08/2017  . PVC (premature ventricular  contraction) 09/08/2017  . Glaucoma 09/08/2017  . Hx of meningioma of the brain 08/28/2017  . Hoarseness of voice 02/28/2016  . Raynaud's phenomenon 02/28/2016  . Esophageal spasm 09/20/2013  . Trigger finger 07/08/2012  . Osteoporosis 03/18/2012  . Fracture, shoulder 03/06/2012  . Hyperlipidemia   . Breast cancer [facility])     Past Medical History:  Diagnosis Date  . Autoimmune disease ([place])   . Barrett's esophagus   . Bone lesion    sacrum  . Breast cancer ([place]) 2010   cT1 N0 M0; ER+/ PR+/ HER2 neg; s/p lumpectomy 07/2010; started adjuvant hormonal therapy 10/2009  . Cervical cancer ([place])   . Dysphagia   . Fracture of left shoulder   . Fx distal radius NEC-closed   . Glaucoma   . Humerus fracture 03/2012   impacted left humueral neck fracture, treated by Dr. Amedeo Plenty  . Hyperlipidemia   . Hypertension   . Neuromuscular disorder ([place])   . Osteopenia   . Osteoporosis   . Osteoporosis 03/18/2012  . Personal history of chemotherapy 2010   Left Breast Cancer  . Personal history of radiation therapy 2010   Left Breast Cancer  . Pneumonia     Past Surgical History:  Procedure Laterality Date  . ABDOMINAL HYSTERECTOMY  1972   cancer          1 tube 1 ovary  . APPENDECTOMY    . BREAST LUMPECTOMY Left 2010  . colon polyp removal    . EYE SURGERY    . HAND SURGERY    . ortho procedures     multiple  . OVARIAN CYST REMOVAL  1978  . SHOULDER SURGERY  1954   left    Social History   Tobacco Use  . Smoking status: Former Smoker    Last attempt to quit: 12/16/1986    Years since quitting: 31.4  . Smokeless tobacco: Never Used  . Tobacco comment: FORMER SMOKER 30 YEARS AGO  Substance Use Topics  . Alcohol use: Yes    Alcohol/week: 0.0 oz    Comment: OCCASIONALLY  . Drug use: No    Family History  Problem Relation Age of Onset  . Heart disease Father   . Cancer Sister        lung, kidney  . Cancer Brother        brain  . Cancer Paternal Aunt        breast  .  Breast cancer Paternal Aunt   . Cancer Brother        liver, lung  . Osteoporosis Neg Hx     Allergies  Allergen Reactions  . Daypro [Oxaprozin] Swelling    Face and tongue    Medication list has been reviewed and updated.  Current Outpatient Medications on File Prior to Visit  Medication Sig Dispense Refill  . Calcium Carbonate-Vitamin D (CALCIUM + D PO) Take 1 capsule by mouth 2 (two) times daily. Taking 2 pills per day    . latanoprost (XALATAN) 0.005 % ophthalmic solution Place 1 drop into both eyes at bedtime.     Marland Kitchen levothyroxine (SYNTHROID, LEVOTHROID) 50 MCG tablet Take 1 tablet (50 mcg total) by mouth daily before breakfast. 30 tablet 4  . Multiple Vitamin (MULTIVITAMIN) capsule Take 1 capsule by mouth daily.    . rosuvastatin (CRESTOR) 10 MG tablet TAKE 1 BY MOUTH DAILY 90 tablet 3   No current facility-administered medications on file prior to visit.     Review of Systems:  As per HPI- otherwise negative. Feels well No fever or chills No CP or SOB   Physical Examination: Vitals:   05/13/18 1315  BP: 122/70  Pulse: 69  Resp: 16  SpO2: 98%   Vitals:   05/13/18 1315  Weight: 109 lb (49.4 kg)  Height: '5\' 2"'$  (1.575 m)   Body mass index is 19.94 kg/m. Ideal Body Weight: Weight in (lb) to have BMI = 25: 136.4  GEN: WDWN, NAD, Non-toxic, A & O x 3, looks well  HEENT: Atraumatic, Normocephalic. Neck supple. No masses, No LAD. Ears and Nose: No external deformity. CV: RRR, No M/G/R. No JVD. No thrill. No extra heart sounds. PULM: CTA B, no wheezes, crackles, rhonchi. No retractions. No resp. distress. No accessory muscle use. EXTR: No c/c/e NEURO Normal gait.  PSYCH: Normally interactive. Conversant. Not depressed or anxious appearing.  Calm demeanor.  She has an irritated patch on her left hip approx 5 inches in diameter.  Skin is scaly and dry.  Appears most c/w contact derm   Assessment and Plan: Rash and nonspecific skin eruption - Plan: valACYclovir  (VALTREX) 1000 MG tablet  Abnormal TSH - Plan: TSH  Rash on left hip- seems most c/w contact derm but steroid cream did not help and in fact  seemed to make it worse Will try a course of valtrx for possible atypical shingles She will use an OTC anti-fungal cream and vaseline as needed She will let me know if not better!  Signed Lamar Blinks, MD  Received her thyroid level Results for orders placed or performed in visit on 05/13/18  TSH  Result Value Ref Range   TSH 2.42 0.35 - 4.50 uIU/mL   Thyroid in range- letter to pt

## 2018-05-13 ENCOUNTER — Ambulatory Visit: Payer: Medicare Other | Admitting: Family Medicine

## 2018-05-13 ENCOUNTER — Encounter: Payer: Self-pay | Admitting: Family Medicine

## 2018-05-13 VITALS — BP 122/70 | HR 69 | Resp 16 | Ht 62.0 in | Wt 109.0 lb

## 2018-05-13 DIAGNOSIS — R7989 Other specified abnormal findings of blood chemistry: Secondary | ICD-10-CM | POA: Diagnosis not present

## 2018-05-13 DIAGNOSIS — R21 Rash and other nonspecific skin eruption: Secondary | ICD-10-CM

## 2018-05-13 LAB — TSH: TSH: 2.42 u[IU]/mL (ref 0.35–4.50)

## 2018-05-13 MED ORDER — VALACYCLOVIR HCL 1 G PO TABS: 1000.0000 mg | ORAL_TABLET | Freq: Three times a day (TID) | ORAL | 0 refills | Status: DC

## 2018-05-13 NOTE — Patient Instructions (Signed)
Good to see you today- we will check your TSH today and I will be in touch with your result asap I am not sure what you have on your hip- it may even be a mild case of shingles Let's try a week of valtrex for possible shingles Also, please pick up an OTC anti-fungal cream such as clotrimazole and apply twice a day  A moisturizing ointment such as vaseline may also be helpful.   Please let me know how your rash is doing- especially if not cleared up in 1-2 weeks

## 2018-05-18 ENCOUNTER — Telehealth: Payer: Self-pay

## 2018-05-18 NOTE — Telephone Encounter (Signed)
Yes we can work her in- tomorrow?

## 2018-05-18 NOTE — Progress Notes (Addendum)
[name] at [facility] [address] [place], [place] [address] [phone]  Date:  05/19/2018   Name:  [name]   DOB:  [birth year]   MRN:  [id number]  PCP:  Darreld Mclean, MD    Chief Complaint: Rash (medication reaction to levothyroxine? possible biopsy, rash on left hip area, 2 months increased in size)   History of Present Illness:  [name] is a 84 y.o. very pleasant female patient who presents with the following:  following up on rash on her left hip today- she was seen here on 8/2 with a macule on her left hip for 5 weeks or so:  Rash on left hip- seems most c/w contact derm but steroid cream did not help and in fact seemed to make it worse Will try a course of valtrx for possible atypical shingles She will use an OTC anti-fungal cream and vaseline as needed She will let me know if not better!  We tried changing her topical treatment and used valrex as above- however this did not help  In fact the macule seems to be getting larger. It is still not present on any other area of her body so I do not think this is a systemic issue or drug reaction.  She would like to do a bx today which we are glad to do Not on any blood thinners   Patient Active Problem List   Diagnosis Date Noted  . Diarrhea 09/08/2017  . Nausea vomiting and diarrhea 09/08/2017  . Elevated serum creatinine 09/08/2017  . PVC (premature ventricular contraction) 09/08/2017  . Glaucoma 09/08/2017  . Hx of meningioma of the brain 08/28/2017  . Hoarseness of voice 02/28/2016  . Raynaud's phenomenon 02/28/2016  . Esophageal spasm 09/20/2013  . Trigger finger 07/08/2012  . Osteoporosis 03/18/2012  . Fracture, shoulder 03/06/2012  . Hyperlipidemia   . Breast cancer [facility])     Past Medical History:  Diagnosis Date  . Autoimmune disease ([place])   . Barrett's esophagus   . Bone lesion    sacrum  . Breast cancer ([place]) 2010   cT1 N0 M0; ER+/  PR+/ HER2 neg; s/p lumpectomy 07/2010; started adjuvant hormonal therapy 10/2009  . Cervical cancer ([place])   . Dysphagia   . Fracture of left shoulder   . Fx distal radius NEC-closed   . Glaucoma   . Humerus fracture 03/2012   impacted left humueral neck fracture, treated by Dr. Amedeo Plenty  . Hyperlipidemia   . Hypertension   . Neuromuscular disorder ([place])   . Osteopenia   . Osteoporosis   . Osteoporosis 03/18/2012  . Personal history of chemotherapy 2010   Left Breast Cancer  . Personal history of radiation therapy 2010   Left Breast Cancer  . Pneumonia     Past Surgical History:  Procedure Laterality Date  . ABDOMINAL HYSTERECTOMY  1972   cancer          1 tube 1 ovary  . APPENDECTOMY    . BREAST LUMPECTOMY Left 2010  . colon polyp removal    . EYE SURGERY    . HAND SURGERY    . ortho procedures     multiple  . OVARIAN CYST REMOVAL  1978  . SHOULDER SURGERY  1954   left    Social History   Tobacco Use  . Smoking status: Former Smoker    Last attempt to quit: 12/16/1986  Years since quitting: 31.4  . Smokeless tobacco: Never Used  . Tobacco comment: FORMER SMOKER 30 YEARS AGO  Substance Use Topics  . Alcohol use: Yes    Alcohol/week: 0.0 oz    Comment: OCCASIONALLY  . Drug use: No    Family History  Problem Relation Age of Onset  . Heart disease Father   . Cancer Sister        lung, kidney  . Cancer Brother        brain  . Cancer Paternal Aunt        breast  . Breast cancer Paternal Aunt   . Cancer Brother        liver, lung  . Osteoporosis Neg Hx     Allergies  Allergen Reactions  . Daypro [Oxaprozin] Swelling    Face and tongue    Medication list has been reviewed and updated.  Current Outpatient Medications on File Prior to Visit  Medication Sig Dispense Refill  . Calcium Carbonate-Vitamin D (CALCIUM + D PO) Take 1 capsule by mouth 2 (two) times daily. Taking 2 pills per day    . latanoprost (XALATAN) 0.005 % ophthalmic solution Place 1 drop  into both eyes at bedtime.     Marland Kitchen levothyroxine (SYNTHROID, LEVOTHROID) 50 MCG tablet Take 1 tablet (50 mcg total) by mouth daily before breakfast. 30 tablet 4  . Multiple Vitamin (MULTIVITAMIN) capsule Take 1 capsule by mouth daily.    . rosuvastatin (CRESTOR) 10 MG tablet TAKE 1 BY MOUTH DAILY 90 tablet 3  . valACYclovir (VALTREX) 1000 MG tablet Take 1 tablet (1,000 mg total) by mouth 3 (three) times daily. 21 tablet 0   No current facility-administered medications on file prior to visit.     Review of Systems:  As per HPI- otherwise negative. No fever or chills Feels well overall    Physical Examination: Vitals:   05/19/18 1122  BP: 130/76  Pulse: 79  Resp: 16  Temp: 97.8 F (36.6 C)  SpO2: 98%   Vitals:   05/19/18 1122  Weight: 108 lb 9.6 oz (49.3 kg)  Height: '5\' 2"'$  (1.575 m)   Body mass index is 19.86 kg/m. Ideal Body Weight: Weight in (lb) to have BMI = 25: 136.4   GEN: WDWN, NAD, Non-toxic, Alert & Oriented x 3 HEENT: Atraumatic, Normocephalic.  Ears and Nose: No external deformity. EXTR: No clubbing/cyanosis/edema NEURO: Normal gait.  PSYCH: Normally interactive. Conversant. Not depressed or anxious appearing.  Calm demeanor.  Left hip/ buttock displays a larger macule which appears most c/w a contact dermatitis.  There are mild excoriations present now.   VC obtained Area on left buttock prepped with betadine and alcohol 89m of 1% lido with epi used for anesthesia 341mpunch biopsy taken from left buttock  Silver nitrate for hemostatics, dressed with band- aid.  Checked pt about 10 min after bx and she still showed good hemostasis  Assessment and Plan: Rash and nonspecific skin eruption - Plan: predniSONE (DELTASONE) 20 MG tablet, Dermatology pathology  Bx of skin lesion today as it has not responded to treatment- trying to get a dx Will have her try prednisone for 5 days while we await her bx report Went over precautions for her wound   Signed JeLamar BlinksMD  Results for orders placed or performed in visit on 05/13/18  TSH  Result Value Ref Range   TSH 2.42 0.35 - 4.50 uIU/mL    Called her when I got pathology report back 8/10-  nummular derm or Id reaction possible   There is a superficial perivascular inflammatory infiltrate composed of lymphocytes, histiocytes and scattered eosinophils. Lymphocytes extend to the epidermis where there is spongiosis and overlying parakeratosis. Following review of the hematoxylin and eosin sections, a PAS stain was obtained to exclude a fungal infection. The PAS stain is negative for fungal organisms. The findings are most consistent with an eczematous dermatitis such as contact or nummular dermatitis or an id reaction.  Pt reports that the prednisone seems to have really helped- her itching is resolved and rash is improved.  Asked her to take 10 mg of pred for an additional 5 days to help prevent rebound.  She will do so and will alert me if the rash returns or does not continue to get better

## 2018-05-18 NOTE — Telephone Encounter (Signed)
Copied from [place] [id number]. Topic: General - Other >> May 17, 2018  3:53 PM [name] wrote: Reason for CRM: Patient called to let doctor know that she has stopped taking the levothyroxine (SYNTHROID, LEVOTHROID) 50 MCG tablet since Friday 05/14/18.  Patient feels that this medication could be causing the rash that she has.  She wanted to let doctor know and get advice on what else she should do.  CB# [phone].

## 2018-05-18 NOTE — Telephone Encounter (Signed)
Patient verbalized understanding. She states she will start taking medication again she just was not sure what was causing this. She states the rash has spread over her body and is very uncomfortable. She states her voice has also gone away and noticed this since starting the medication. Offered patient appointment. Patient would like to come in but you are full this week. At 19 and 20. Please advise if we can work her in?

## 2018-05-18 NOTE — Telephone Encounter (Signed)
Please give her a call back.  I really don't think this rash was due to a medication because it is so localized. Generally a drug rash will be more all over the body.   I would encourage her to go back on her thyroid med if she feels comfortable  Why don't we have her come in and I can take a biopsy of her rash at her convenience to try and find out what it is

## 2018-05-19 ENCOUNTER — Encounter: Payer: Self-pay | Admitting: Family Medicine

## 2018-05-19 ENCOUNTER — Ambulatory Visit: Payer: Medicare Other | Admitting: Family Medicine

## 2018-05-19 VITALS — BP 130/76 | HR 79 | Temp 97.8°F | Resp 16 | Ht 62.0 in | Wt 108.6 lb

## 2018-05-19 DIAGNOSIS — L309 Dermatitis, unspecified: Secondary | ICD-10-CM

## 2018-05-19 DIAGNOSIS — R21 Rash and other nonspecific skin eruption: Secondary | ICD-10-CM

## 2018-05-19 MED ORDER — PREDNISONE 20 MG PO TABS: ORAL_TABLET | ORAL | 0 refills | Status: DC

## 2018-05-19 NOTE — Telephone Encounter (Signed)
Patient has appointment today with pcp.

## 2018-05-19 NOTE — Patient Instructions (Signed)
It was good to see you today- take care and I will be in touch with your pathology report asap In the meantime let's try prednisone 20 mg once a day for 5 days  Leave your biopsy site clean and dry for 24 hours- then you may shower as usual, cover with a band- aid for a few days as needed If any sign of infection- redness, heat, pus, pain- please let me know right away  If the wound is bleeding apply firm pressure for 15 minutes.  If this does not resolve bleeding please seek immediate care

## 2018-07-12 ENCOUNTER — Other Ambulatory Visit: Payer: Self-pay | Admitting: Family Medicine

## 2018-07-12 DIAGNOSIS — Z1231 Encounter for screening mammogram for malignant neoplasm of breast: Secondary | ICD-10-CM

## 2018-07-14 ENCOUNTER — Ambulatory Visit (INDEPENDENT_AMBULATORY_CARE_PROVIDER_SITE_OTHER): Payer: Medicare Other

## 2018-07-14 DIAGNOSIS — Z23 Encounter for immunization: Secondary | ICD-10-CM | POA: Diagnosis not present

## 2018-07-14 NOTE — Progress Notes (Signed)
High

## 2018-08-15 NOTE — Progress Notes (Signed)
[place] at [facility] [address] [place], [place] [address] [phone]  Date:  08/23/2018   Name:  [name]   DOB:  [birth year]   MRN:  [id number]  PCP:  Darreld Mclean, MD    Chief Complaint: Hyperlipidemia (6 month follow up) and Skin Lesion (right side of nose, see dermatology? )   History of Present Illness:  [name] is a 84 y.o. very pleasant female patient who presents with the following:  Following up today History of osteoporosis, glaucoma, hyperlipidemia, breast cancer 2010/ cervical cancer 1972 From our last visit in May: She is an avid walker- she will go out when she gets home after her visit today  She is still doing 5.5 or more miles a day- her goal is 8- 10k steps a day Married to [place]  Her eyes are doing ok, she is continuing her drops. She does to [facility] for glaucoma care Needs a refill of her cholesterol meds today Last bone density not quite 2 years ago   Tetanus is UTD Has not had shingrix yet but is trying to get this shot- it is hard to find! Neurology checked a TSH for her in January and it was high- I will repeat today   Lab Results  Component Value Date   TSH 2.42 05/13/2018   Dexa: 11/17- order for next month mammo a year ago - will be done tomorrow, at [facility], will try and get her bone density done tomorrow as well  Called and they were unable to add this on unfortunately.  She will do this at a later date  Recent recheck of thyroid was ok   Synthroid 50 crestor 10  Flu shot done Shingles #1 done at [facility], she will do the 2nd in December  She did have a rash on her left hip over the summer but this finally did resolve Her eyes are doing well- she continues to go to [place] Her eyes are not painful She is still doing her walking- she is quite energetic   She does notice that her voice seems hoarse- she has noted this for a few months She also notes  intermittent difficulty with swallowing- she may get food stuck in her esophagus She has had esophageal dilation with Dr. Michail Sermon maybe 3 times She last saw him maybe 2-3 years ago  Also, she has noted a spot on the right side of her nose- she has a dermatologist, Marcine Matar at [facility] who has treated numerous skin cancers for her   Lab Results  Component Value Date   TSH 2.42 05/13/2018    Patient Active Problem List   Diagnosis Date Noted  . Diarrhea 09/08/2017  . Nausea vomiting and diarrhea 09/08/2017  . Elevated serum creatinine 09/08/2017  . PVC (premature ventricular contraction) 09/08/2017  . Glaucoma 09/08/2017  . Hx of meningioma of the brain 08/28/2017  . Hoarseness of voice 02/28/2016  . Raynaud's phenomenon 02/28/2016  . Esophageal spasm 09/20/2013  . Trigger finger 07/08/2012  . Osteoporosis 03/18/2012  . Fracture, shoulder 03/06/2012  . Hyperlipidemia   . Breast cancer [facility])     Past Medical History:  Diagnosis Date  . Autoimmune disease ([place])   . Barrett's esophagus   . Bone lesion    sacrum  . Breast cancer ([place]) 2010   cT1 N0 M0; ER+/ PR+/ HER2 neg; s/p lumpectomy 07/2010; started adjuvant hormonal therapy 10/2009  .  Cervical cancer ([place])   . Dysphagia   . Fracture of left shoulder   . Fx distal radius NEC-closed   . Glaucoma   . Humerus fracture 03/2012   impacted left humueral neck fracture, treated by Dr. Amedeo Plenty  . Hyperlipidemia   . Hypertension   . Neuromuscular disorder ([place])   . Osteopenia   . Osteoporosis   . Osteoporosis 03/18/2012  . Personal history of chemotherapy 2010   Left Breast Cancer  . Personal history of radiation therapy 2010   Left Breast Cancer  . Pneumonia     Past Surgical History:  Procedure Laterality Date  . ABDOMINAL HYSTERECTOMY  1972   cancer          1 tube 1 ovary  . APPENDECTOMY    . BREAST LUMPECTOMY Left 2010  . colon polyp removal    . EYE SURGERY    . HAND SURGERY    . ortho procedures      multiple  . OVARIAN CYST REMOVAL  1978  . SHOULDER SURGERY  1954   left    Social History   Tobacco Use  . Smoking status: Former Smoker    Last attempt to quit: 12/16/1986    Years since quitting: 31.7  . Smokeless tobacco: Never Used  . Tobacco comment: FORMER SMOKER 30 YEARS AGO  Substance Use Topics  . Alcohol use: Yes    Alcohol/week: 0.0 standard drinks    Comment: OCCASIONALLY  . Drug use: No    Family History  Problem Relation Age of Onset  . Heart disease Father   . Cancer Sister        lung, kidney  . Cancer Brother        brain  . Cancer Paternal Aunt        breast  . Breast cancer Paternal Aunt   . Cancer Brother        liver, lung  . Osteoporosis Neg Hx     Allergies  Allergen Reactions  . Daypro [Oxaprozin] Swelling    Face and tongue    Medication list has been reviewed and updated.  Current Outpatient Medications on File Prior to Visit  Medication Sig Dispense Refill  . Calcium Carbonate-Vitamin D (CALCIUM + D PO) Take 1 capsule by mouth 2 (two) times daily. Taking 2 pills per day    . latanoprost (XALATAN) 0.005 % ophthalmic solution Place 1 drop into both eyes at bedtime.     Marland Kitchen levothyroxine (SYNTHROID, LEVOTHROID) 50 MCG tablet Take 1 tablet (50 mcg total) by mouth daily before breakfast. 30 tablet 4  . Multiple Vitamin (MULTIVITAMIN) capsule Take 1 capsule by mouth daily.    . rosuvastatin (CRESTOR) 10 MG tablet TAKE 1 BY MOUTH DAILY 90 tablet 3  . valACYclovir (VALTREX) 1000 MG tablet Take 1 tablet (1,000 mg total) by mouth 3 (three) times daily. (Patient not taking: Reported on 08/23/2018) 21 tablet 0   No current facility-administered medications on file prior to visit.     Review of Systems:  As per HPI- otherwise negative. No fever or chills No Cp or SOB  Physical Examination: Vitals:   08/23/18 0920  BP: 122/70  Pulse: 62  Resp: 16  SpO2: 100%   Vitals:   08/23/18 0920  Weight: 110 lb 12.8 oz (50.3 kg)  Height: _0   (1.575 m)   Body mass index is 20.27 kg/m. Ideal Body Weight: Weight in (lb) to have BMI = 25: 136.4  GEN: WDWN, NAD,  Non-toxic, A & O x 3, normal weight, looks well  HEENT: Atraumatic, Normocephalic. Neck supple. No masses, No LAD.  Bilateral TM wnl, oropharynx normal.  PEERL,EOMI.   Right side of the nose displays a small scab- pt describes a skin lesion that has started to crack and bleed  Ears and Nose: No external deformity. CV: RRR, No M/G/R. No JVD. No thrill. No extra heart sounds. PULM: CTA B, no wheezes, crackles, rhonchi. No retractions. No resp. distress. No accessory muscle use. ABD: S, NT, ND EXTR: No c/c/e NEURO Normal gait.  PSYCH: Normally interactive. Conversant. Not depressed or anxious appearing.  Calm demeanor.    Assessment and Plan: Age-related osteoporosis without current pathological fracture - Plan: DG Bone Density  Acquired hypothyroidism - Plan: levothyroxine (SYNTHROID, LEVOTHROID) 50 MCG tablet  Medication monitoring encounter - Plan: Basic metabolic panel  Dysphagia, unspecified type - Plan: Basic metabolic panel  Here today for a follow-up visit Check TSH today Also BMP She has a dermatologist and will schedule follow-up for lesion on her face  Also asked her to see her GI doctor for her swallowing difficulty and associated hoarse voice and she plans to do so   Signed Lamar Blinks, MD  Received her labs Results for orders placed or performed in visit on 44/62/86  Basic metabolic panel  Result Value Ref Range   Sodium 139 135 - 145 mEq/L   Potassium 4.8 3.5 - 5.1 mEq/L   Chloride 100 96 - 112 mEq/L   CO2 31 19 - 32 mEq/L   Glucose, Bld 80 70 - 99 mg/dL   BUN 20 6 - 23 mg/dL   Creatinine, Ser 0.97 0.40 - 1.20 mg/dL   Calcium 10.8 (H) 8.4 - 10.5 mg/dL   GFR 57.96 (L) >60.00 mL/min   Letter to pt- calcium has been low in the past but not high Will order a repeat calcium and PTH for her- please have drawn in 2-3 weeks

## 2018-08-23 ENCOUNTER — Ambulatory Visit: Payer: Medicare Other | Admitting: Family Medicine

## 2018-08-23 ENCOUNTER — Encounter: Payer: Self-pay | Admitting: Family Medicine

## 2018-08-23 VITALS — BP 122/70 | HR 62 | Resp 16 | Ht 62.0 in | Wt 110.8 lb

## 2018-08-23 DIAGNOSIS — L989 Disorder of the skin and subcutaneous tissue, unspecified: Secondary | ICD-10-CM

## 2018-08-23 DIAGNOSIS — Z5181 Encounter for therapeutic drug level monitoring: Secondary | ICD-10-CM | POA: Diagnosis not present

## 2018-08-23 DIAGNOSIS — M810 Age-related osteoporosis without current pathological fracture: Secondary | ICD-10-CM | POA: Diagnosis not present

## 2018-08-23 DIAGNOSIS — R1310 Dysphagia, unspecified: Secondary | ICD-10-CM | POA: Diagnosis not present

## 2018-08-23 DIAGNOSIS — E039 Hypothyroidism, unspecified: Secondary | ICD-10-CM

## 2018-08-23 LAB — BASIC METABOLIC PANEL
BUN: 20 mg/dL (ref 6–23)
CO2: 31 mEq/L (ref 19–32)
Calcium: 10.8 mg/dL — ABNORMAL HIGH (ref 8.4–10.5)
Chloride: 100 mEq/L (ref 96–112)
Creatinine, Ser: 0.97 mg/dL (ref 0.40–1.20)
GFR: 57.96 mL/min — ABNORMAL LOW (ref >60.00)
Glucose, Bld: 80 mg/dL (ref 70–99)
Potassium: 4.8 mEq/L (ref 3.5–5.1)
Sodium: 139 mEq/L (ref 135–145)

## 2018-08-23 MED ORDER — LEVOTHYROXINE SODIUM 50 MCG PO TABS: 50.0000 ug | ORAL_TABLET | Freq: Every day | ORAL | 3 refills | Status: DC

## 2018-08-23 NOTE — Patient Instructions (Addendum)
Please call Dr. Michail Sermon and schedule a follow-up visit with him- Phone: [phone]  Please also see your dermatologist soon regarding the skin lesion on your face We will check on your kidney function today- otherwise let's do labs in 6 months  They do not have any spots for bone density at the breast center tomorrow- you can schedule a convenient time tomorrow when you ar ein

## 2018-08-24 ENCOUNTER — Ambulatory Visit
Admission: RE | Admit: 2018-08-24 | Discharge: 2018-08-24 | Disposition: A | Discharge status: Home / Self Care | Payer: Medicare Other | Source: Ambulatory Visit | Attending: Family Medicine | Admitting: Family Medicine

## 2018-08-24 DIAGNOSIS — Z1231 Encounter for screening mammogram for malignant neoplasm of breast: Secondary | ICD-10-CM | POA: Diagnosis not present

## 2018-08-25 DIAGNOSIS — Z85828 Personal history of other malignant neoplasm of skin: Secondary | ICD-10-CM | POA: Diagnosis not present

## 2018-08-25 DIAGNOSIS — D485 Neoplasm of uncertain behavior of skin: Secondary | ICD-10-CM | POA: Diagnosis not present

## 2018-08-25 DIAGNOSIS — C44311 Basal cell carcinoma of skin of nose: Secondary | ICD-10-CM | POA: Diagnosis not present

## 2018-08-31 ENCOUNTER — Telehealth: Payer: Self-pay | Admitting: *Deleted

## 2018-08-31 NOTE — Telephone Encounter (Signed)
Received Dermatopathology Report results from [facility]; forwarded to provider/SLS 11/19

## 2018-09-01 ENCOUNTER — Encounter: Payer: Self-pay | Admitting: Family Medicine

## 2018-09-01 DIAGNOSIS — C4490 Unspecified malignant neoplasm of skin, unspecified: Secondary | ICD-10-CM | POA: Insufficient documentation

## 2018-09-07 DIAGNOSIS — C44319 Basal cell carcinoma of skin of other parts of face: Secondary | ICD-10-CM | POA: Diagnosis not present

## 2018-09-07 DIAGNOSIS — Z85828 Personal history of other malignant neoplasm of skin: Secondary | ICD-10-CM | POA: Diagnosis not present

## 2018-09-13 ENCOUNTER — Ambulatory Visit
Admission: RE | Admit: 2018-09-13 | Discharge: 2018-09-13 | Disposition: A | Discharge status: Home / Self Care | Payer: Medicare Other | Source: Ambulatory Visit | Attending: Family Medicine | Admitting: Family Medicine

## 2018-09-13 DIAGNOSIS — Z780 Asymptomatic menopausal state: Secondary | ICD-10-CM | POA: Diagnosis not present

## 2018-09-13 DIAGNOSIS — M810 Age-related osteoporosis without current pathological fracture: Secondary | ICD-10-CM

## 2018-09-13 DIAGNOSIS — M8589 Other specified disorders of bone density and structure, multiple sites: Secondary | ICD-10-CM | POA: Diagnosis not present

## 2018-09-14 ENCOUNTER — Other Ambulatory Visit (INDEPENDENT_AMBULATORY_CARE_PROVIDER_SITE_OTHER): Payer: Medicare Other

## 2018-09-15 LAB — PTH, INTACT AND CALCIUM
Calcium: 9.8 mg/dL (ref 8.6–10.4)
PTH: 65 pg/mL — ABNORMAL HIGH (ref 14–64)

## 2018-09-18 ENCOUNTER — Telehealth: Payer: Self-pay | Admitting: Family Medicine

## 2018-09-18 DIAGNOSIS — R7989 Other specified abnormal findings of blood chemistry: Secondary | ICD-10-CM

## 2018-09-21 NOTE — Telephone Encounter (Signed)
Have called pt over the last few days to go over her recent labs and Dexa report  DUAL X-RAY ABSORPTIOMETRY (DXA) FOR BONE MINERAL DENSITY  IMPRESSION: Referring Physician: Lamar Blinks Your patient completed a BMD test using Lunar IDXA DXA system ( analysis version: 16 ) manufactured by [employer]. Technologist: KT PATIENT: Name: [name], [name] Patient ID: [id number] Birth Date: 1930/12/03 Height: 61.5 in. Sex: Female Measured: 09/13/2018 Weight: 109.5 lbs. Indications: Advanced Age, Breast Cancer History, Caucasian, Estrogen Deficient, Height Loss (781.91), History of Osteoporosis, Hypothyroid, Hysterectomy, Levothyroxine, Low Body Weight (783.22), Postmenopausal, Secondary Osteoporosis, History of Fracture (Adult) (V15.51) Fractures: Left wrist, Right wrist, Shoulder Treatments: Calcium (E943.0), Vitamin D (E933.5)  ASSESSMENT: The BMD measured at Femur Total Left is 0.754 g/cm2 with a T-score of -2.0. This patient is considered osteopenic according to [facility]) criteria. There has been no statistically significant change in BMD of left hip since prior exam dated 11/11/2013.  The scan quality is good. L- 1, L-2 were excluded due to exclusion in the past. Spine was not compared to prior study due to exclusion of vertebral bodies on current exam. DXA exam performed on prior Hologic device measured only unilateral hip (Total Mean was not measured) Therefore current or prior Total Mean cannot be compared.  Site Region Measured Date Measured Age YA BMD Significant CHANGE T-score DualFemur Total Left 09/13/2018  82.4     -2.0  0.754 g/cm2 DualFemur Total Left 11/11/2013  77.5     -1.9  0.771 g/cm2  AP Spine L3-L4   09/13/2018  82.4     -1.4  1.030 g/cm2  World Health Organization [facility]) criteria for post-menopausal, Caucasian Women: Normal    T-score at or above -1 SD Osteopenia  T-score between -1 and -2.5  SD Osteoporosis T-score at or below -2.5 SD  RECOMMENDATION: 1. All patients should optimize calcium and vitamin D intake. 2. Consider FDA approved medical therapies in postmenopausal women and men aged 69 years and older, based on the following: a. A hip or vertebral (clinical or morphometric) fracture b. T- score < or = -2.5 at the femoral neck or spine after appropriate evaluation to exclude secondary causes c. Low bone mass (T-score between -1.0 and -2.5 at the femoral neck or spine) and a 10 year probability of a hip fracture > or = 3% or a 10 year probability of a major osteoporosis-related fracture > or = 20% based on the [place]-adapted WHO algorithm d. Clinician judgment and/or patient preferences may indicate treatment for people with 10-year fracture probabilities above or below these levels FOLLOW-UP: People with diagnosed cases of osteoporosis or at high risk for fracture should have regular bone mineral density tests. For patients eligible for Medicare, routine testing is allowed once every 2 years. The testing frequency can be increased to one year for patients who have rapidly progressing disease, those who are receiving or discontinuing medical therapy to restore bone mass, or have additional risk factors.  I have reviewed this report and agree with the above findings.  [facility]  FRAX* 10-year Probability of Fracture Based on femoral neck BMD: DualFemur (Right)  Major Osteoporotic Fracture: 18.8% Hip Fracture:        5.6% Population:         [place] (Caucasian) Risk Factors: Secondary Osteoporosis, History of Fracture (Adult) (V15.51)  *FRAX is a Materials engineer of the [employer] for Metabolic Bone Disease, a [place] (WHO) [facility].  ASSESSMENT: The probability  of a major osteoporotic fracture is 18.8 % within the next ten years. The probability of hip fracture is 5.6 %  within the next 10 years.  Results for orders placed or performed in visit on 09/14/18  PTH, Intact and Calcium  Result Value Ref Range   PTH 65 (H) 14 - 64 pg/mL   Calcium 9.8 8.6 - 10.4 mg/dL    So far no luck in reaching her but will continue to try

## 2018-09-21 NOTE — Telephone Encounter (Signed)
Called and was able to reach her.  We went over her recent results.  She may have hyperparathyroidism, and she does have osteopenia.  I will refer to endocrinology for an evaluation.  If they do not feel that her parathyroids are an issue, or if surgery is not needed than a bisphosphonate by mouth would be indicated   She is in agreement with this plan

## 2018-09-30 NOTE — Telephone Encounter (Signed)
Pt states she hasn't heard anything about her referral.  Pt would like update on the status.

## 2018-10-01 NOTE — Telephone Encounter (Signed)
Any idea whats going on with this referral? I will be happy to call and find out. Do you see which office it was sent to?

## 2018-10-05 NOTE — Telephone Encounter (Signed)
Thanks Gwen-  I assume the patient is aware of her appt?

## 2018-10-05 NOTE — Telephone Encounter (Signed)
Pt has been scheduled for 10/15/2018

## 2018-10-15 ENCOUNTER — Encounter: Payer: Self-pay | Admitting: Internal Medicine

## 2018-10-15 ENCOUNTER — Ambulatory Visit (INDEPENDENT_AMBULATORY_CARE_PROVIDER_SITE_OTHER): Payer: Medicare Other | Admitting: Internal Medicine

## 2018-10-15 VITALS — BP 138/72 | HR 65 | Ht 62.0 in | Wt 113.8 lb

## 2018-10-15 DIAGNOSIS — E213 Hyperparathyroidism, unspecified: Secondary | ICD-10-CM | POA: Diagnosis not present

## 2018-10-15 LAB — BASIC METABOLIC PANEL
BUN: 18 mg/dL (ref 6–23)
CO2: 29 mEq/L (ref 19–32)
Calcium: 10.2 mg/dL (ref 8.4–10.5)
Chloride: 103 mEq/L (ref 96–112)
Creatinine, Ser: 0.98 mg/dL (ref 0.40–1.20)
GFR: 57.26 mL/min — ABNORMAL LOW (ref >60.00)
Glucose, Bld: 79 mg/dL (ref 70–99)
Potassium: 4.4 mEq/L (ref 3.5–5.1)
Sodium: 139 mEq/L (ref 135–145)

## 2018-10-15 LAB — ALBUMIN: Albumin: 4.1 g/dL (ref 3.5–5.2)

## 2018-10-15 LAB — VITAMIN D 25 HYDROXY (VIT D DEFICIENCY, FRACTURES): VITD: 75.72 ng/mL (ref 30.00–100.00)

## 2018-10-15 NOTE — Patient Instructions (Signed)
-   Stay Hydrated as much as you can  - Consume 2-3 servings of calcium a day  - FOODS THAT CONTAIN CALCIUM  Calcium can be found in many foods, not only in dairy products.  Dairy Foods  Yogurt (1 cup) 350 mg  Milk (1 cup) 300 mg  Cheddar cheese (1 oz.) 204 mg  Ricotta cheese, part skim (1/4 cup) 169 mg  Cottage cheese (1 cup) 150 mg  Nondairy Foods  Whole Grain Total cereal (3/4 cup) 1000 mg  Pink salmon with bones, sardines (3 oz., cooked) 181 mg  Black beans (1 cup) 103 mg  Broccoli (1 cup, cooked) 150 mg  Almonds (1 tbsp.) 50 mg  Soy Products  Soy yogurt with calcium (3/4 cup) 300 mg  Soy milk enriched with calcium (1 cup) 300 mg  Tofu, firm or extra firm (1/4 cup) 250 mg  Soy nuts, roasted/salted (1/2 cup) 103 mg

## 2018-10-15 NOTE — Progress Notes (Signed)
Name: [name]  MRN/ DOB: [id number], Jun 07, 1931    Age/ Sex: 85 y.o., female    PCP: Copland, Gay Filler, MD   Reason for Endocrinology Evaluation:      Date of Initial Endocrinology Evaluation: 10/15/2018     HPI: [name] is a 85 y.o. female with a past medical history of  Left Breast cancer ( S/P lumpectomy in 2011, and radiation).  The patient presented for initial endocrinology clinic visit on 10/15/2018 for consultative assistance with her elevated PTH.   [name] indicates that she was first diagnosed with elevated PTH in 2019.[name] Since that time, she hasnot experienced symptoms of constipation, polyuria, polydipsia, generalized weakness, diffuse muscle pains, significant memory impairment. She does use of over the counter calcium . She denies use lithium or  HCTZ.  She was on 3 tabs of Calcium carbonate many years ago, this was reduced to BID , until recently when she was told she has elevated calcium, she cut it back to one tablet daily of Calcium carbonate -Vitamin D  Of note, the patient is also on MVI.   She denies history of kidney stones, kidney disease, liver disease, granulomatous disease. She does have hx of  osteoporosis and prior fractures, she was on Fosamax for many years, the last time she used it was 6 yrs ago. She has declined recent attempt at restarting it by her PCP. Daily dietary calcium intake: 1 servings . She denies family history of osteoporosis, parathyroid disease, thyroid disease.   Pt with hx of  chronic voice hoarseness and dysphagia, has been evaluated multiple times by  GI for esophageal stretching.    HISTORY:  Past Medical History:  Past Medical History:  Diagnosis Date  . Autoimmune disease ([place])   . Barrett's esophagus   . Bone lesion    sacrum  . Breast cancer ([place]) 2010   cT1 N0 M0; ER+/ PR+/ HER2 neg; s/p lumpectomy 07/2010; started adjuvant hormonal therapy 10/2009  . Cervical cancer ([place])   . Dysphagia   . Fracture of  left shoulder   . Fx distal radius NEC-closed   . Glaucoma   . Humerus fracture 03/2012   impacted left humueral neck fracture, treated by Dr. Amedeo Plenty  . Hyperlipidemia   . Hypertension   . Neuromuscular disorder ([place])   . Osteopenia   . Osteoporosis   . Osteoporosis 03/18/2012  . Personal history of chemotherapy 2010   Left Breast Cancer  . Personal history of radiation therapy 2010   Left Breast Cancer  . Pneumonia     Past Surgical History:  Past Surgical History:  Procedure Laterality Date  . ABDOMINAL HYSTERECTOMY  1972   cancer          1 tube 1 ovary  . APPENDECTOMY    . BREAST LUMPECTOMY Left 2010  . colon polyp removal    . EYE SURGERY    . HAND SURGERY    . ortho procedures     multiple  . OVARIAN CYST REMOVAL  1978  . [place]   left      Social History:  reports that she quit smoking about 31 years ago. She has never used smokeless tobacco. She reports current alcohol use. She reports that she does not use drugs.  Family History: family history includes Breast cancer in her paternal aunt; Cancer in her brother, brother, paternal aunt, and sister; Heart disease in her father.   HOME MEDICATIONS: Current  Outpatient Medications on File Prior to Visit  Medication Sig Dispense Refill  . Calcium Carbonate-Vitamin D (CALCIUM + D PO) Take 1 capsule by mouth daily. Taking 2 pills per day    . latanoprost (XALATAN) 0.005 % ophthalmic solution Place 1 drop into both eyes at bedtime.     [name] levothyroxine (SYNTHROID, LEVOTHROID) 50 MCG tablet Take 1 tablet (50 mcg total) by mouth daily before breakfast. 90 tablet 3  . Multiple Vitamin (MULTIVITAMIN) capsule Take 1 capsule by mouth daily.    . rosuvastatin (CRESTOR) 10 MG tablet TAKE 1 BY MOUTH DAILY 90 tablet 3  . valACYclovir (VALTREX) 1000 MG tablet Take 1 tablet (1,000 mg total) by mouth 3 (three) times daily. (Patient not taking: Reported on 08/23/2018) 21 tablet 0   No current facility-administered  medications on file prior to visit.       REVIEW OF SYSTEMS: A comprehensive ROS was conducted with the patient and is negative except as per HPI and below:  Review of Systems  Constitutional: Negative for malaise/fatigue and weight loss.  HENT: Negative for congestion and sore throat.   Eyes: Negative for blurred vision and photophobia.  Respiratory: Negative for cough and shortness of breath.   Cardiovascular: Negative for chest pain and palpitations.  Gastrointestinal: Negative for constipation and nausea.  Genitourinary: Negative for frequency.  Skin: Negative.   Neurological: Negative for tingling and tremors.  Endo/Heme/Allergies: Negative for polydipsia.       OBJECTIVE:  VS: BP 138/72 (BP Location: Left Arm, Patient Position: Sitting, Cuff Size: Normal)   Pulse 65   Ht _0  (1.575 m)   Wt 113 lb 12.8 oz (51.6 kg)   SpO2 95%   BMI 20.81 kg/m    Wt Readings from Last 3 Encounters:  10/15/18 113 lb 12.8 oz (51.6 kg)  08/23/18 110 lb 12.8 oz (50.3 kg)  05/19/18 108 lb 9.6 oz (49.3 kg)     EXAM: General: Pt appears well and is in NAD  Hydration: Well-hydrated with moist mucous membranes and good skin turgor  Eyes: External eye exam normal without stare, lid lag or exophthalmos.  EOM intact.    Ears, Nose, Throat: Hearing: Grossly intact bilaterally Dental: Good dentition  Throat: Clear without mass, erythema or exudate  Neck: General: Supple without adenopathy. Thyroid: Thyroid size normal.  No goiter or nodules appreciated. No thyroid bruit.  Lungs: Clear with good BS bilat with no rales, rhonchi, or wheezes  Heart: Auscultation: RRR.  Abdomen: Normoactive bowel sounds, soft, nontender, without masses or organomegaly palpable  Extremities:  BL LE: No pretibial edema normal ROM and strength.  Skin: Hair: Texture and amount normal with gender appropriate distribution Skin Inspection: No rashes Skin Palpation: Skin temperature, texture, and thickness normal to  palpation  Neuro: Cranial nerves: II - XII grossly intact  Motor: Normal strength throughout DTRs: 2+ and symmetric in UE without delay in relaxation phase  Mental Status: Judgment, insight: Intact Orientation: Oriented to time, place, and person Mood and affect: No depression, anxiety, or agitation     DATA REVIEWED: Results for BEATRIC, FULOP (MRN [id number]) as of 10/18/2018 11:34  Ref. Range 10/15/2018 15:40  Sodium Latest Ref Range: 135 - 145 mEq/L 139  Potassium Latest Ref Range: 3.5 - 5.1 mEq/L 4.4  Chloride Latest Ref Range: 96 - 112 mEq/L 103  CO2 Latest Ref Range: 19 - 32 mEq/L 29  Glucose Latest Ref Range: 70 - 99 mg/dL 79  BUN Latest Ref Range: 6 - 23  mg/dL 18  Creatinine Latest Ref Range: 0.40 - 1.20 mg/dL 0.98  Calcium Latest Ref Range: 8.4 - 10.5 mg/dL 10.2  Albumin Latest Ref Range: 3.5 - 5.2 g/dL 4.1  GFR Latest Ref Range: >60.00 mL/min 57.26 (L)  VITD Latest Ref Range: 30.00 - 100.00 ng/mL 75.72    PTH- pending   ASSESSMENT/PLAN/RECOMMENDATIONS:   1. Hyperparathyroidism :  - It is unclear to me at this time if she has primary vs secondary hyperparathyroidism.  - Historically she has always had normal calcium levels, until 2018 , when her calcium was on low ( low normal after correction), but then in November, 2019 her calcium was high at 10.8 mg/dL . It is difficult to determine what her true calcium reading was in November, 2019 as there was no concomitant albumin.  - We discussed Primary Hyperparathyroidism as a differential , we also discussed secondary causes of hyperparathyroidism such as vitamin D deficiency, CKD, calcium malabsorption or low calcium intake.   - Normocalcemic primary hyperparathyroidism is also possible but there is considerable disagreement regarding the existence of this disorder and appropriate treatment thereof.    - We will start with obtaining labs today : PTH, Vitamin D, BMP and albumin.  - Pt encouraged to stay hydrated. She was  advised to either stop the calcium carbonate or the MVI, and to stay on one tablet of Calcium carbonate at this time, she was also encouraged to consume 2-3 servings of calcium  Daily.    F/u in 3 months.   Signed electronically by: Mack Guise, MD  [facility] End[facility]  [facility] [address] [place], [place] [address] Phone: [phone] FAX: [phone]   CC: Darreld Mclean, [employer] [address] [place] [address] Phone: [phone] Fax: [phone]   Return to Endocrinology clinic as below: Future Appointments  Date Time Provider [place]  10/15/2018  3:00 PM Caylen Yardley, Melanie Crazier, MD [id number] None  02/21/2019  9:00 AM Copland, Gay Filler, MD [facility]

## 2018-10-18 ENCOUNTER — Encounter: Payer: Self-pay | Admitting: Internal Medicine

## 2018-10-18 LAB — PTH, INTACT AND CALCIUM
Calcium: 10 mg/dL (ref 8.6–10.4)
PTH: 34 pg/mL (ref 14–64)

## 2018-10-25 ENCOUNTER — Telehealth: Payer: Self-pay | Admitting: Internal Medicine

## 2018-10-25 NOTE — Telephone Encounter (Signed)
Spoke to [name] and she just wanted to discuss her lab work and whether she needed a follow up appointment. [name] stated that she would call back once she has spoken to Dr. Edilia Bo.

## 2018-10-25 NOTE — Telephone Encounter (Signed)
Patient is calling to find out if she needs to still see Dr Kelton Pillar since her labs were normal and if so can she do her lab work at a lab near her home.

## 2018-10-29 DIAGNOSIS — H401133 Primary open-angle glaucoma, bilateral, severe stage: Secondary | ICD-10-CM | POA: Diagnosis not present

## 2018-11-05 DIAGNOSIS — H01001 Unspecified blepharitis right upper eyelid: Secondary | ICD-10-CM | POA: Diagnosis not present

## 2018-11-05 DIAGNOSIS — Z961 Presence of intraocular lens: Secondary | ICD-10-CM | POA: Diagnosis not present

## 2018-11-05 DIAGNOSIS — H01004 Unspecified blepharitis left upper eyelid: Secondary | ICD-10-CM | POA: Diagnosis not present

## 2018-11-05 DIAGNOSIS — H01002 Unspecified blepharitis right lower eyelid: Secondary | ICD-10-CM | POA: Diagnosis not present

## 2018-11-10 NOTE — Progress Notes (Addendum)
Subjective:   [name] is a 85 y.o. female who presents for Medicare Annual (Subsequent) preventive examination.  Pt reports she feels really really good with no issues.  Review of Systems: No ROS.  Medicare Wellness Visit. Additional risk factors are reflected in the social history. Cardiac Risk Factors include: advanced age (>59mn, >>59women);dyslipidemia Sleep patterns: no issues. 9p-5a Home Safety/Smoke Alarms: Feels safe in home. Smoke alarms in place.  Lives with husband in 1 story home. Stairs with rails at entrance. No issues with stairs. Walk-in shower.   Female:        Mammo- 08/24/18      Dexa scan-  09/13/18    Objective:     Vitals: BP 130/72 (BP Location: Left Arm, Patient Position: Sitting, Cuff Size: Normal)   Pulse 82   Ht '5\' 2"'$  (1.575 m)   Wt 113 lb 6.4 oz (51.4 kg)   SpO2 98%   BMI 20.74 kg/m   Body mass index is 20.74 kg/m.  Advanced Directives 11/11/2018 09/08/2017 09/28/2016 02/05/2015 08/14/2014 08/11/2014  Does Patient Have a Medical Advance Directive? No No No No No No  Would patient like information on creating a medical advance directive? No - Patient declined No - Patient declined No - Patient declined - No - patient declined information No - patient declined information    Tobacco Social History   Tobacco Use  Smoking Status Former Smoker  . Last attempt to quit: 12/16/1986  . Years since quitting: 31.9  Smokeless Tobacco Never Used  Tobacco Comment   FORMER SMOKER 30 YEARS AGO     Counseling given: Not Answered Comment: FORMER SMOKER 30 YEARS AGO   Clinical Intake: Pain : No/denies pain   Past Medical History:  Diagnosis Date  . [place]   . Barrett's esophagus   . Bone lesion    sacrum  . [place]   cT1 N0 M0; ER+/ PR+/ HER2 neg; s/p lumpectomy 07/2010; started adjuvant hormonal therapy 10/2009  . [place]   . Dysphagia   . Fracture of left shoulder   . Fx distal radius  NEC-closed   . Glaucoma   . Humerus fracture 03/2012   impacted left humueral neck fracture, treated by Dr. GAmedeo Plenty . Hyperlipidemia   . Hypertension   . Neuromuscular disorder ([place]   . Osteopenia   . Osteoporosis   . Osteoporosis 03/18/2012  . Personal history of chemotherapy 2010   Left Breast Cancer  . Personal history of radiation therapy 2010   Left Breast Cancer  . Pneumonia   . Thyroid disease    Past Surgical History:  Procedure Laterality Date  . ABDOMINAL HYSTERECTOMY  1972   cancer          1 tube 1 ovary  . APPENDECTOMY    . BREAST LUMPECTOMY Left 2010  . colon polyp removal    . EYE SURGERY    . HAND SURGERY    . ortho procedures     multiple  . OVARIAN CYST REMOVAL  1978  . SHOULDER SURGERY  1954   left   Family History  Problem Relation Age of Onset  . Heart disease Father   . Cancer Sister        lung, kidney  . Cancer Brother        brain  . Cancer Paternal Aunt        breast  . Breast cancer Paternal Aunt   . Cancer  Brother        liver, lung  . Osteoporosis Neg Hx    Social History   Socioeconomic History  . Marital status: Married    Spouse name: Not on file  . Number of children: 2  . Years of education: 35  . Highest education level: Not on file  Occupational History  . Occupation: retired  Scientific laboratory technician  . Financial resource strain: Not on file  . Food insecurity:    Worry: Not on file    Inability: Not on file  . Transportation needs:    Medical: Not on file    Non-medical: Not on file  Tobacco Use  . Smoking status: Former Smoker    Last attempt to quit: 12/16/1986    Years since quitting: 31.9  . Smokeless tobacco: Never Used  . Tobacco comment: FORMER SMOKER 30 YEARS AGO  Substance and Sexual Activity  . Alcohol use: Yes    Alcohol/week: 0.0 standard drinks    Comment: OCCASIONALLY  . Drug use: No  . Sexual activity: Not on file  Lifestyle  . Physical activity:    Days per week: Not on file    Minutes per session:  Not on file  . Stress: Not on file  Relationships  . Social connections:    Talks on phone: Not on file    Gets together: Not on file    Attends religious service: Not on file    Active member of club or organization: Not on file    Attends meetings of clubs or organizations: Not on file    Relationship status: Not on file  Other Topics Concern  . Not on file  Social History Narrative   Married   Exercise: Yes   Patient lives with husband in a one story home.  Has 2 daughters.  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  Education: high school.      Outpatient Encounter Medications as of 11/11/2018  Medication Sig  . Calcium Carbonate-Vitamin D (CALCIUM + D PO) Take 1 capsule by mouth daily. Taking 2 pills per day  . latanoprost (XALATAN) 0.005 % ophthalmic solution Place 1 drop into both eyes at bedtime.   Marland Kitchen levothyroxine (SYNTHROID, LEVOTHROID) 50 MCG tablet Take 1 tablet (50 mcg total) by mouth daily before breakfast.  . Multiple Vitamin (MULTIVITAMIN) capsule Take 1 capsule by mouth daily.  . rosuvastatin (CRESTOR) 10 MG tablet TAKE 1 BY MOUTH DAILY  . valACYclovir (VALTREX) 1000 MG tablet Take 1 tablet (1,000 mg total) by mouth 3 (three) times daily. (Patient not taking: Reported on 08/23/2018)   No facility-administered encounter medications on file as of 11/11/2018.     Activities of Daily Living In your present state of health, do you have any difficulty performing the following activities: 11/11/2018  Hearing? N  Vision? N  Comment occasionally wears readers.  Difficulty concentrating or making decisions? N  Comment pt does crossword puzzle every morning.  Walking or climbing stairs? N  Dressing or bathing? N  Doing errands, shopping? N  Preparing Food and eating ? N  Using the Toilet? N  In the past six months, have you accidently leaked urine? N  Do you have problems with loss of bowel control? N  Managing your Medications? N  Managing your Finances? N  Housekeeping or  managing your Housekeeping? N  Some recent data might be hidden    Patient Care Team: Copland, Gay Filler, MD as PCP - General (Family Medicine) Martinique, Peter M, MD (  Cardiology) Roseanne Kaufman, MD (Orthopedic Surgery) Particia Nearing, MD (Dermatology) Heath Lark, MD as Consulting Physician (Hematology and Oncology)    Assessment:   This is a routine wellness examination for [place]. Physical assessment deferred to PCP.  Exercise Activities and Dietary recommendations Current Exercise Habits: Home exercise routine, Type of exercise: walking, Time (Minutes): > 60, Frequency (Times/Week): 7, Weekly Exercise (Minutes/Week): 0, Intensity: Mild, Exercise limited by: None identified   Diet (meal preparation, eat out, water intake, caffeinated beverages, dairy products, fruits and vegetables): well balanced, on average, 3 meals per day. Drinks mostly water all day.   Goals    . Maintain healthy active lifestyle.       Fall Risk Fall Risk  11/11/2018 10/14/2017 08/27/2016 02/28/2016 08/30/2015  Falls in the past year? 0 No No No No  Comment - - - - -   Depression Screen PHQ 2/9 Scores 11/11/2018 08/27/2016 02/28/2016 10/30/2015  PHQ - 2 Score 0 0 0 0     Cognitive Function Ad8 score reviewed for issues:  Issues making decisions:no  Less interest in hobbies / activities:no  Repeats questions, stories (family complaining):no  Trouble using ordinary gadgets (microwave, computer, phone):no  Forgets the month or year: no  Mismanaging finances: no  Remembering appts:no  Daily problems with thinking and/or memory:no Ad8 score is=0        Immunization History  Administered Date(s) Administered  . Influenza Split 06/22/2012  . Influenza, High Dose Seasonal PF 07/15/2016, 08/17/2017, 07/14/2018  . Influenza,inj,Quad PF,6+ Mos 07/13/2013, 07/13/2014, 07/16/2015  . Pneumococcal Conjugate-13 05/30/2014  . Pneumococcal-Unspecified 05/13/2004  . Td 03/02/2017    Screening  Tests Health Maintenance  Topic Date Due  . TETANUS/TDAP  03/03/2027  . INFLUENZA VACCINE  Completed  . DEXA SCAN  Completed  . PNA vac Low Risk Adult  Completed      Plan:    Please schedule your next medicare wellness visit with me in 1 yr.  Continue to eat healthy and drink water.  Continue to exercise.   Keep up the great work! You are AMAZING!!  I have personally reviewed and noted the following in the patient's chart:   . Medical and social history . Use of alcohol, tobacco or illicit drugs  . Current medications and supplements . Functional ability and status . Nutritional status . Physical activity . Advanced directives . List of other physicians . Hospitalizations, surgeries, and ER visits in previous 12 months . Vitals . Screenings to include cognitive, depression, and falls . Referrals and appointments  In addition, I have reviewed and discussed with patient certain preventive protocols, quality metrics, and best practice recommendations. A written personalized care plan for preventive services as well as general preventive health recommendations were provided to patient.     [name], [place]  11/11/2018   I have reviewed the above Medicare wellness visit by [name], and agree with her documentation

## 2018-11-11 ENCOUNTER — Ambulatory Visit (INDEPENDENT_AMBULATORY_CARE_PROVIDER_SITE_OTHER): Payer: Medicare Other | Admitting: *Deleted

## 2018-11-11 ENCOUNTER — Encounter: Payer: Self-pay | Admitting: *Deleted

## 2018-11-11 VITALS — BP 130/72 | HR 82 | Ht 62.0 in | Wt 113.4 lb

## 2018-11-11 DIAGNOSIS — Z0000 Encounter for general adult medical examination without abnormal findings: Secondary | ICD-10-CM

## 2018-11-11 NOTE — Patient Instructions (Signed)
Please schedule your next medicare wellness visit with me in 1 yr.  Continue to eat healthy and drink water.  Continue to exercise.   Keep up the great work! You are AMAZING!!   [name] , Thank you for taking time to come for your Medicare Wellness Visit. I appreciate your ongoing commitment to your health goals. Please review the following plan we discussed and let me know if I can assist you in the future.   These are the goals we discussed: Goals    . Maintain healthy active lifestyle.       This is a list of the screening recommended for you and due dates:  Health Maintenance  Topic Date Due  . Tetanus Vaccine  03/03/2027  . Flu Shot  Completed  . DEXA scan (bone density measurement)  Completed  . Pneumonia vaccines  Completed    Health Maintenance After Age 2 After age 34, you are at a higher risk for certain long-term diseases and infections as well as injuries from falls. Falls are a major cause of broken bones and head injuries in people who are older than age 26. Getting regular preventive care can help to keep you healthy and well. Preventive care includes getting regular testing and making lifestyle changes as recommended by your health care provider. Talk with your health care provider about:  Which screenings and tests you should have. A screening is a test that checks for a disease when you have no symptoms.  A diet and exercise plan that is right for you. What should I know about screenings and tests to prevent falls? Screening and testing are the best ways to find a health problem early. Early diagnosis and treatment give you the best chance of managing medical conditions that are common after age 20. Certain conditions and lifestyle choices may make you more likely to have a fall. Your health care provider may recommend:  Regular vision checks. Poor vision and conditions such as cataracts can make you more likely to have a fall. If you wear glasses, make sure  to get your prescription updated if your vision changes.  Medicine review. Work with your health care provider to regularly review all of the medicines you are taking, including over-the-counter medicines. Ask your health care provider about any side effects that may make you more likely to have a fall. Tell your health care provider if any medicines that you take make you feel dizzy or sleepy.  Osteoporosis screening. Osteoporosis is a condition that causes the bones to get weaker. This can make the bones weak and cause them to break more easily.  Blood pressure screening. Blood pressure changes and medicines to control blood pressure can make you feel dizzy.  Strength and balance checks. Your health care provider may recommend certain tests to check your strength and balance while standing, walking, or changing positions.  Foot health exam. Foot pain and numbness, as well as not wearing proper footwear, can make you more likely to have a fall.  Depression screening. You may be more likely to have a fall if you have a fear of falling, feel emotionally low, or feel unable to do activities that you used to do.  Alcohol use screening. Using too much alcohol can affect your balance and may make you more likely to have a fall. What actions can I take to lower my risk of falls? General instructions  Talk with your health care provider about your risks for falling. Tell your  health care provider if: ? You fall. Be sure to tell your health care provider about all falls, even ones that seem minor. ? You feel dizzy, sleepy, or off-balance.  Take over-the-counter and prescription medicines only as told by your health care provider. These include any supplements.  Eat a healthy diet and maintain a healthy weight. A healthy diet includes low-fat dairy products, low-fat (lean) meats, and fiber from whole grains, beans, and lots of fruits and vegetables. Home safety  Remove any tripping hazards, such as  rugs, cords, and clutter.  Install safety equipment such as grab bars in bathrooms and safety rails on stairs.  Keep rooms and walkways well-lit. Activity   Follow a regular exercise program to stay fit. This will help you maintain your balance. Ask your health care provider what types of exercise are appropriate for you.  If you need a cane or walker, use it as recommended by your health care provider.  Wear supportive shoes that have nonskid soles. Lifestyle  Do not drink alcohol if your health care provider tells you not to drink.  If you drink alcohol, limit how much you have: ? 0-1 drink a day for women. ? 0-2 drinks a day for men.  Be aware of how much alcohol is in your drink. In the [place] one drink equals one typical bottle of beer (12 oz), one-half glass of wine (5 oz), or one shot of hard liquor (1 oz).  Do not use any products that contain nicotine or tobacco, such as cigarettes and e-cigarettes. If you need help quitting, ask your health care provider. Summary  Having a healthy lifestyle and getting preventive care can help to protect your health and wellness after age 64.  Screening and testing are the best way to find a health problem early and help you avoid having a fall. Early diagnosis and treatment give you the best chance for managing medical conditions that are more common for people who are older than age 32.  Falls are a major cause of broken bones and head injuries in people who are older than age 52. Take precautions to prevent a fall at home.  Work with your health care provider to learn what changes you can make to improve your health and wellness and to prevent falls. This information is not intended to replace advice given to you by your health care provider. Make sure you discuss any questions you have with your health care provider. Document Released: 08/12/2017 Document Revised: 08/12/2017 Document Reviewed: 08/12/2017 Elsevier Interactive Patient  Education  2019 [employer].

## 2018-11-18 ENCOUNTER — Encounter: Payer: Self-pay | Admitting: Family Medicine

## 2018-11-18 DIAGNOSIS — Z85828 Personal history of other malignant neoplasm of skin: Secondary | ICD-10-CM | POA: Diagnosis not present

## 2018-11-18 DIAGNOSIS — C44729 Squamous cell carcinoma of skin of left lower limb, including hip: Secondary | ICD-10-CM | POA: Diagnosis not present

## 2018-11-18 DIAGNOSIS — L814 Other melanin hyperpigmentation: Secondary | ICD-10-CM | POA: Diagnosis not present

## 2018-11-18 DIAGNOSIS — L570 Actinic keratosis: Secondary | ICD-10-CM | POA: Diagnosis not present

## 2018-11-18 DIAGNOSIS — D485 Neoplasm of uncertain behavior of skin: Secondary | ICD-10-CM | POA: Diagnosis not present

## 2018-11-26 ENCOUNTER — Telehealth: Payer: Self-pay | Admitting: *Deleted

## 2018-11-26 DIAGNOSIS — Z85828 Personal history of other malignant neoplasm of skin: Secondary | ICD-10-CM | POA: Diagnosis not present

## 2018-11-26 DIAGNOSIS — C44729 Squamous cell carcinoma of skin of left lower limb, including hip: Secondary | ICD-10-CM | POA: Diagnosis not present

## 2018-11-26 NOTE — Telephone Encounter (Signed)
Received Dermatopathology Report results from [facility]; forwarded to provider/SLS 02/14     

## 2018-11-30 ENCOUNTER — Encounter: Payer: Self-pay | Admitting: Family Medicine

## 2018-11-30 DIAGNOSIS — D049 Carcinoma in situ of skin, unspecified: Secondary | ICD-10-CM | POA: Insufficient documentation

## 2018-11-30 HISTORY — DX: Carcinoma in situ of skin, unspecified: D04.9

## 2018-12-03 ENCOUNTER — Encounter: Payer: Self-pay | Admitting: Family Medicine

## 2018-12-10 ENCOUNTER — Ambulatory Visit: Payer: Medicare Other | Admitting: Internal Medicine

## 2018-12-10 ENCOUNTER — Telehealth: Payer: Self-pay

## 2018-12-10 NOTE — Telephone Encounter (Signed)
Copied from [place] [id number]. Topic: General - Inquiry >> Dec 07, 2018  4:24 PM Jeri Cos wrote: Reason for CRM: Darlyne Russian from [facility] called and was inquiring if the pt have a biopsy performed between November 2019-February 2020.

## 2018-12-10 NOTE — Telephone Encounter (Signed)
No, but I did do a bx in August of last year.  We can fax them the report if they need it

## 2018-12-16 NOTE — Telephone Encounter (Signed)
[name] w/[facility] called for the status of her request because no one had called her back.  She was told that Dr. Lorelei Pont said a biopsy was done in August and that report could be sent to them if they wanted it.  [name] would like to have that report faxed to them at Fax: [phone].

## 2018-12-17 NOTE — Telephone Encounter (Signed)
Result has been faxed to [facility]

## 2018-12-20 ENCOUNTER — Other Ambulatory Visit: Payer: Self-pay

## 2018-12-20 ENCOUNTER — Encounter (HOSPITAL_BASED_OUTPATIENT_CLINIC_OR_DEPARTMENT_OTHER): Payer: Self-pay | Admitting: *Deleted

## 2018-12-20 ENCOUNTER — Emergency Department (HOSPITAL_BASED_OUTPATIENT_CLINIC_OR_DEPARTMENT_OTHER): Payer: Medicare Other

## 2018-12-20 ENCOUNTER — Emergency Department (HOSPITAL_BASED_OUTPATIENT_CLINIC_OR_DEPARTMENT_OTHER)
Admission: EM | Admit: 2018-12-20 | Discharge: 2018-12-20 | Disposition: A | Discharge status: Home / Self Care | Payer: Medicare Other | Attending: Emergency Medicine | Admitting: Emergency Medicine

## 2018-12-20 DIAGNOSIS — W101XXA Fall (on)(from) sidewalk curb, initial encounter: Secondary | ICD-10-CM | POA: Insufficient documentation

## 2018-12-20 DIAGNOSIS — M7989 Other specified soft tissue disorders: Secondary | ICD-10-CM | POA: Diagnosis not present

## 2018-12-20 DIAGNOSIS — S01511A Laceration without foreign body of lip, initial encounter: Secondary | ICD-10-CM

## 2018-12-20 DIAGNOSIS — S0033XA Contusion of nose, initial encounter: Secondary | ICD-10-CM | POA: Insufficient documentation

## 2018-12-20 DIAGNOSIS — S5011XA Contusion of right forearm, initial encounter: Secondary | ICD-10-CM | POA: Diagnosis not present

## 2018-12-20 DIAGNOSIS — Y9301 Activity, walking, marching and hiking: Secondary | ICD-10-CM | POA: Insufficient documentation

## 2018-12-20 DIAGNOSIS — Y998 Other external cause status: Secondary | ICD-10-CM | POA: Insufficient documentation

## 2018-12-20 DIAGNOSIS — Y9289 Other specified places as the place of occurrence of the external cause: Secondary | ICD-10-CM | POA: Insufficient documentation

## 2018-12-20 DIAGNOSIS — M25521 Pain in right elbow: Secondary | ICD-10-CM | POA: Diagnosis not present

## 2018-12-20 DIAGNOSIS — W19XXXA Unspecified fall, initial encounter: Secondary | ICD-10-CM

## 2018-12-20 DIAGNOSIS — S59901A Unspecified injury of right elbow, initial encounter: Secondary | ICD-10-CM | POA: Diagnosis not present

## 2018-12-20 MED ORDER — ACETAMINOPHEN 500 MG PO TABS
1000.0000 mg | ORAL_TABLET | Freq: Once | ORAL | Status: AC
  Administered 2018-12-20: 1000 mg via ORAL
  Filled 2018-12-20: qty 2

## 2018-12-20 MED ORDER — LIDOCAINE-EPINEPHRINE (PF) 2 %-1:200000 IJ SOLN
10.0000 mL | Freq: Once | INTRAMUSCULAR | Status: AC
  Administered 2018-12-20: 10 mL
  Filled 2018-12-20 (×2): qty 10

## 2018-12-20 MED ORDER — CEPHALEXIN 500 MG PO CAPS: 1000.0000 mg | ORAL_CAPSULE | Freq: Two times a day (BID) | ORAL | 0 refills | Status: DC

## 2018-12-20 NOTE — ED Triage Notes (Signed)
She tripped and fell. Laceration to her lower lip and abrasion to her nose. She is ambulatory.

## 2018-12-20 NOTE — ED Provider Notes (Addendum)
[facility] EMERGENCY DEPARTMENT Provider Note   CSN: [id number] Arrival date & time: 12/20/18  1435    History   Chief Complaint Chief Complaint  Patient presents with  . Fall    HPI [name] is a 85 y.o. female.     HPI Patient tripped on a curb coming out of [place].  She struck her face.  She reports she had bleeding from her lip.  She reports she developed a swollen area on her right forearm that just came up.  She reports she has been walking without difficulty.  No neck back or lower extremity pain.  Patient is not on any blood thinners.  No confusion.  No nausea no vomiting.  Patient thinks tetanus is up-to-date. Past Medical History:  Diagnosis Date  . Autoimmune disease ([place])   . Barrett's esophagus   . Bone lesion    sacrum  . Breast cancer ([place]) 2010   cT1 N0 M0; ER+/ PR+/ HER2 neg; s/p lumpectomy 07/2010; started adjuvant hormonal therapy 10/2009  . Cervical cancer ([place])   . Dysphagia   . Fracture of left shoulder   . Fx distal radius NEC-closed   . Glaucoma   . Humerus fracture 03/2012   impacted left humueral neck fracture, treated by Dr. Amedeo Plenty  . Hyperlipidemia   . Hypertension   . Neuromuscular disorder ([place])   . Osteopenia   . Osteoporosis   . Osteoporosis 03/18/2012  . Personal history of chemotherapy 2010   Left Breast Cancer  . Personal history of radiation therapy 2010   Left Breast Cancer  . Pneumonia   . Thyroid disease     Patient Active Problem List   Diagnosis Date Noted  . Squamous cell carcinoma in situ of skin 11/30/2018  . Skin cancer 09/01/2018  . Diarrhea 09/08/2017  . Nausea vomiting and diarrhea 09/08/2017  . Elevated serum creatinine 09/08/2017  . PVC (premature ventricular contraction) 09/08/2017  . Glaucoma 09/08/2017  . Hx of meningioma of the brain 08/28/2017  . Hoarseness of voice 02/28/2016  . Raynaud's phenomenon 02/28/2016  . Esophageal spasm 09/20/2013  . Trigger finger 07/08/2012  . Osteoporosis  03/18/2012  . Fracture, shoulder 03/06/2012  . Hyperlipidemia   . Breast cancer [facility])     Past Surgical History:  Procedure Laterality Date  . ABDOMINAL HYSTERECTOMY  1972   cancer          1 tube 1 ovary  . APPENDECTOMY    . BREAST LUMPECTOMY Left 2010  . colon polyp removal    . EYE SURGERY    . HAND SURGERY    . ortho procedures     multiple  . OVARIAN CYST REMOVAL  1978  . [place]   left     OB History   No obstetric history on file.      Home Medications    Prior to Admission medications   Medication Sig Start Date End Date Taking? Authorizing Provider  Calcium Carbonate-Vitamin D (CALCIUM + D PO) Take 1 capsule by mouth daily. Taking 2 pills per day   Yes [provider]  latanoprost (XALATAN) 0.005 % ophthalmic solution Place 1 drop into both eyes at bedtime.    Yes [provider]  levothyroxine (SYNTHROID, LEVOTHROID) 50 MCG tablet Take 1 tablet (50 mcg total) by mouth daily before breakfast. 08/23/18  Yes Copland, Gay Filler, MD  Multiple Vitamin (MULTIVITAMIN) capsule Take 1 capsule by mouth daily.   Yes [provider]  rosuvastatin (CRESTOR) 10 MG tablet TAKE 1 BY MOUTH DAILY 02/17/18  Yes Copland, Gay Filler, MD  valACYclovir (VALTREX) 1000 MG tablet Take 1 tablet (1,000 mg total) by mouth 3 (three) times daily. 05/13/18  Yes Copland, Gay Filler, MD    Family History Family History  Problem Relation Age of Onset  . Heart disease Father   . Cancer Sister        lung, kidney  . Cancer Brother        brain  . Cancer Paternal Aunt        breast  . Breast cancer Paternal Aunt   . Cancer Brother        liver, lung  . Osteoporosis Neg Hx     Social History Social History   Tobacco Use  . Smoking status: Former Smoker    Last attempt to quit: 12/16/1986    Years since quitting: 32.0  . Smokeless tobacco: Never Used  . Tobacco comment: FORMER SMOKER 30 YEARS AGO  Substance Use Topics  . Alcohol use: Yes     Alcohol/week: 0.0 standard drinks    Comment: OCCASIONALLY  . Drug use: No     Allergies   Daypro [oxaprozin] and Prolia [denosumab]   Review of Systems Review of Systems 10 Systems reviewed and are negative for acute change except as noted in the HPI.   Physical Exam Updated Vital Signs BP (!) 177/103   Pulse (!) 104   Temp 97.9 F (36.6 C) (Oral)   Resp 20   Ht _0  (1.6 m)   Wt 50.3 kg   SpO2 100%   BMI 19.66 kg/m   Physical Exam Constitutional:      Comments: Alert and clinically well in appearance.  No distress.  HENT:     Head:     Comments: Moderate swelling of the nasal bridge.  No blood in the nasal passages.  No septal hematoma.  Superficial abrasion to the bridge of the nose.  Bilateral TMs normal no hemotympanum.  Complex lower lip laceration with puncture and slight maceration of the inner aspect of the vermilion border at the mucosa.  More superficial laceration of the outer edge of vermilion border in longitudinal orientation.  See attached images. Eyes:     Extraocular Movements: Extraocular movements intact.     Pupils: Pupils are equal, round, and reactive to light.  Neck:     Comments: C-spine nontender Cardiovascular:     Rate and Rhythm: Normal rate and regular rhythm.  Pulmonary:     Effort: Pulmonary effort is normal.     Breath sounds: Normal breath sounds.  Chest:     Chest wall: No tenderness.  Abdominal:     General: There is no distension.     Palpations: Abdomen is soft.  Musculoskeletal: Normal range of motion.        General: Swelling present.     Comments: 5 cm hematoma to volar aspect of right forearm.  No overlying abrasion.  Normal range of motion of all extremities upper and lower without difficulty.  Skin:    General: Skin is warm and dry.  Neurological:     General: No focal deficit present.     Mental Status: She is oriented to person, place, and time.     Coordination: Coordination normal.  Psychiatric:        Mood  and Affect: Mood normal.        ED Treatments / Results  Labs (all  labs ordered are listed, but only abnormal results are displayed) Labs Reviewed - No data to display  EKG None  Radiology No results found.  Procedures .Marland KitchenLaceration Repair Date/Time: 12/20/2018 3:52 PM Performed by: Charlesetta Shanks, MD Authorized by: Charlesetta Shanks, MD   Consent:    Consent obtained:  Verbal   Consent given by:  Patient   Risks discussed:  Infection, pain, poor cosmetic result and poor wound healing Anesthesia (see MAR for exact dosages):    Anesthesia method:  Local infiltration   Local anesthetic:  Lidocaine 2% WITH epi Laceration details:    Location:  Lip   Lip location:  Lower lip, full thickness   Vermilion border involved: yes     Height of lip laceration:  More than half vertical height   Length (cm):  1   Depth (mm):  10 Repair type:    Repair type:  Complex Pre-procedure details:    Preparation:  Patient was prepped and draped in usual sterile fashion Exploration:    Hemostasis achieved with:  Epinephrine   Wound exploration: entire depth of wound probed and visualized     Wound extent: areolar tissue violated     Contaminated: yes   Treatment:    Area cleansed with:  Saline   Amount of cleaning:  Extensive   Irrigation solution:  Sterile saline   Irrigation volume:  100   Irrigation method:  Syringe   Scar revision: yes   Skin repair:    Repair method:  Sutures   Suture size:  4-0   Suture technique:  Simple interrupted   Number of sutures:  7 Approximation:    Approximation:  Close   Vermilion border: well-aligned   Post-procedure details:    Dressing:  Open (no dressing)   Patient tolerance of procedure:  Tolerated well, no immediate complications Comments:     Vicryl Rapide used to close repair.  Patient had through and through, macerated laceration of the middle lower lip.  Some small amounts of dirt debris removed.  Cleaned extensively with half saline  half peroxide solution.   (including critical care time)  Medications Ordered in ED Medications  lidocaine-EPINEPHrine (XYLOCAINE W/EPI) 2 %-1:200000 (PF) injection 10 mL (10 mLs Infiltration Given by Other 12/20/18 1512)     Initial Impression / Assessment and Plan / ED Course  I have reviewed the triage vital signs and the nursing notes.  Pertinent labs & imaging results that were available during my care of the patient were reviewed by me and considered in my medical decision making (see chart for details).       Patient fell forward with mechanical fall.  She did have complex lip laceration that I repaired.  No mental status change and no neurologic dysfunction.  Patient is not on anticoagulants.  Injuries are all facial in nature with no associated headache.  At this time will not proceed with CT scan based on no neurologic symptoms, no anticoagulants, no headache and injuries focal to her face.  Advised patient she may have nasal fracture but there is no septal hematoma and no clot occluding the nasal passages.  This may be reassessed on outpatient basis by ENT after swelling has resolved.  Patient also has hematoma to right forearm.  Elbow x-rays do not show any fracture.  Patient is neurologically intact.  Will apply Ace wrap for compression.  Return precautions reviewed.  Final Clinical Impressions(s) / ED Diagnoses   Final diagnoses:  Fall, initial encounter  Lip laceration,  initial encounter  Contusion of nose, initial encounter  Traumatic hematoma of forearm, right, initial encounter    ED Discharge Orders    None       Charlesetta Shanks, MD 12/25/18 Jeffie Pollock    Charlesetta Shanks, MD 12/25/18 1726

## 2018-12-21 NOTE — Progress Notes (Signed)
[facility] [address] [place], [place] [address] [phone]  Date:  12/23/2018   Name:  [name]   DOB:  Oct 21, 1930   MRN:  [id number]  PCP:  Darreld Mclean, MD    Chief Complaint: Hospitalization Follow-up   History of Present Illness:  [name] is a 85 y.o. very pleasant female patient who presents with the following:  Very nice lady with history of osteoporosis, glaucoma, hyperlipidemia, breast cancer in 2010 and cervical cancer in [place] is here today for hospital follow-up She was seen in the ER on 3/9, after a fall-ER note was not signed at the time of visit, but I was able to access some documentation in epic. On 3/9 she was leaving a store and tripped over an object in the parking lot, fell and hit her face She bit through her lower lip and had to have a suture repair.  She also suffered various scrapes and bruises on her body  She hurt her right elbow-it was swollen and tender- they took an x-ray which was negative for fracture She also notes some tenderness in her left ribs that she did not realize was there until she went home-no difficulty breathing, but it hurts to move her trunk or roll over in bed No shortness of breath No fever  Tetanus is UTD  No headaches but her nose is sore  She can breathe through both nostrils She is using ibuprofen as needed No headache  She is treating the skin tear on her right palm with bandaging to prevent further tearing    Dg Facial Bones Complete  Result Date: 12/23/2018 CLINICAL DATA:  [place] on Monday landing on her face. Facial bruising. Nasal region pain. EXAM: FACIAL BONES COMPLETE 3+V COMPARISON:  Head CT, 08/24/2014 FINDINGS: No convincing fracture.  No bone lesion. Sinuses are clear. Soft tissues are unremarkable. IMPRESSION: Negative. Electronically Signed   By: Lajean Manes M.D.   On: 12/23/2018 16:41   Dg Nasal Bones  Result Date:  12/23/2018 CLINICAL DATA:  Pt fell at Store on Monday landed straight on her face. Pt has visible bruising all over face and states that she is having most of her pain around the nasal area. Waters view is on the facial bones order. EXAM: NASAL BONES - 3+ VIEW COMPARISON:  Head CT, 08/24/2014 FINDINGS: Sliver of increased density, which could reflect a minimal fracture, lies along the anterior inferior aspect of the nasal bone. Three other tiny densities are noted in the soft tissues of the anterior nose consistent with soft tissue calcifications. No other evidence of fracture.  Mild nasal soft tissue swelling. IMPRESSION: 1. Sliver of density consistent with a small bone fragment lies along the anterior margin of the inferior nasal bone consistent with a minimal fracture. This could be a chronic finding or acute. No other evidence of a fracture. Electronically Signed   By: Lajean Manes M.D.   On: 12/23/2018 16:44   Dg Ribs Unilateral W/chest Left  Result Date: 12/23/2018 CLINICAL DATA:  Pt states that she was walking through the parking lot at [facility] on Monday when she tripped and fell and landed straight forward on her face and chest. Pt states that she is having pain around the anterior lower ribs as marked by BB. EXAM: LEFT RIBS AND CHEST - 3+ VIEW COMPARISON:  Chest radiograph, 08/10/2012 FINDINGS: There is an acute, nondisplaced fracture of the  anterolateral left eleventh rib, visualized on one view. There are apparent old or subacute fractures of the left lateral seventh through tenth ribs. No bone lesion. Cardiac silhouette is top-normal in size. No mediastinal or hilar masses. Lung base reticular opacities are consistent with atelectasis and/or scarring. Lungs otherwise clear. No pneumothorax. IMPRESSION: Acute nondisplaced fracture of the anterolateral left eleventh rib. No acute cardiopulmonary disease. Electronically Signed   By: Lajean Manes M.D.   On: 12/23/2018 16:39   Dg Elbow Complete  Right  Result Date: 12/20/2018 CLINICAL DATA:  85 y/o  F; fall with right elbow pain and swelling. EXAM: RIGHT ELBOW - COMPLETE 3+ VIEW COMPARISON:  02/05/2015 right elbow radiographs. FINDINGS: There is no evidence of fracture, dislocation, or joint effusion. There is no evidence of arthropathy or other focal bone abnormality. Soft tissue swelling of the proximal dorsal forearm. IMPRESSION: No acute fracture or dislocation identified. Soft tissue swelling of the proximal dorsal forearm. Electronically Signed   By: Kristine Garbe M.D.   On: 12/20/2018 16:31    Patient Active Problem List   Diagnosis Date Noted  . Squamous cell carcinoma in situ of skin 11/30/2018  . Skin cancer 09/01/2018  . Diarrhea 09/08/2017  . Nausea vomiting and diarrhea 09/08/2017  . Elevated serum creatinine 09/08/2017  . PVC (premature ventricular contraction) 09/08/2017  . Glaucoma 09/08/2017  . Hx of meningioma of the brain 08/28/2017  . Hoarseness of voice 02/28/2016  . Raynaud's phenomenon 02/28/2016  . Esophageal spasm 09/20/2013  . Trigger finger 07/08/2012  . Osteoporosis 03/18/2012  . Fracture, shoulder 03/06/2012  . Hyperlipidemia   . Breast cancer [facility])     Past Medical History:  Diagnosis Date  . Autoimmune disease ([place])   . Barrett's esophagus   . Bone lesion    sacrum  . Breast cancer ([place]) 2010   cT1 N0 M0; ER+/ PR+/ HER2 neg; s/p lumpectomy 07/2010; started adjuvant hormonal therapy 10/2009  . Cervical cancer ([place])   . Dysphagia   . Fracture of left shoulder   . Fx distal radius NEC-closed   . Glaucoma   . Humerus fracture 03/2012   impacted left humueral neck fracture, treated by Dr. Amedeo Plenty  . Hyperlipidemia   . Hypertension   . Neuromuscular disorder ([place])   . Osteopenia   . Osteoporosis   . Osteoporosis 03/18/2012  . Personal history of chemotherapy 2010   Left Breast Cancer  . Personal history of radiation therapy 2010   Left Breast Cancer  . Pneumonia   . Thyroid  disease     Past Surgical History:  Procedure Laterality Date  . ABDOMINAL HYSTERECTOMY  1972   cancer          1 tube 1 ovary  . APPENDECTOMY    . BREAST LUMPECTOMY Left 2010  . colon polyp removal    . EYE SURGERY    . HAND SURGERY    . ortho procedures     multiple  . OVARIAN CYST REMOVAL  1978  . SHOULDER SURGERY  1954   left    Social History   Tobacco Use  . Smoking status: Former Smoker    Last attempt to quit: 12/16/1986    Years since quitting: 32.0  . Smokeless tobacco: Never Used  . Tobacco comment: FORMER SMOKER 30 YEARS AGO  Substance Use Topics  . Alcohol use: Yes    Alcohol/week: 0.0 standard drinks    Comment: OCCASIONALLY  . Drug use: No    Family History  Problem Relation Age of Onset  . Heart disease Father   . Cancer Sister        lung, kidney  . Cancer Brother        brain  . Cancer Paternal Aunt        breast  . Breast cancer Paternal Aunt   . Cancer Brother        liver, lung  . Osteoporosis Neg Hx     Allergies  Allergen Reactions  . Daypro [Oxaprozin] Swelling    Face and tongue  . Prolia [Denosumab]     Medication list has been reviewed and updated.  Current Outpatient Medications on File Prior to Visit  Medication Sig Dispense Refill  . Calcium Carbonate-Vitamin D (CALCIUM + D PO) Take 1 capsule by mouth daily. Taking 2 pills per day    . cephALEXin (KEFLEX) 500 MG capsule Take 2 capsules (1,000 mg total) by mouth 2 (two) times daily. 28 capsule 0  . latanoprost (XALATAN) 0.005 % ophthalmic solution Place 1 drop into both eyes at bedtime.     Marland Kitchen levothyroxine (SYNTHROID, LEVOTHROID) 50 MCG tablet Take 1 tablet (50 mcg total) by mouth daily before breakfast. 90 tablet 3  . Multiple Vitamin (MULTIVITAMIN) capsule Take 1 capsule by mouth daily.    . rosuvastatin (CRESTOR) 10 MG tablet TAKE 1 BY MOUTH DAILY 90 tablet 3  . valACYclovir (VALTREX) 1000 MG tablet Take 1 tablet (1,000 mg total) by mouth 3 (three) times daily. 21 tablet  0   No current facility-administered medications on file prior to visit.     Review of Systems:  As per HPI- otherwise negative.   Physical Examination: Vitals:   12/23/18 1523  BP: 110/64  Pulse: (!) 56  Resp: 16  Temp: (!) 97.5 F (36.4 C)  SpO2: 100%   Vitals:   12/23/18 1523  Weight: 112 lb (50.8 kg)  Height: '5\' 3"'$  (1.6 m)   Body mass index is 19.84 kg/m. Ideal Body Weight: Weight in (lb) to have BMI = 25: 140.8   Pulse Readings from Last 3 Encounters:  12/23/18 (!) 56  12/20/18 (!) 52  11/11/18 82   BP Readings from Last 3 Encounters:  12/23/18 110/64  12/20/18 (!) 149/73  11/11/18 130/72    GEN: WDWN, NAD, Non-toxic, A & O x 3, petite build, looks well except for contusions HEENT: Atraumatic, Normocephalic. Neck supple. No masses, No LAD. She has a sutured laceration of the lower lip.  Repair is intact, it appears to be healing External nose is bruised and tender to the touch.  There is no septal hematoma, she is able to breathe through both nostrils Bilateral TM are normal, no blood No cervical spine tenderness Ears and Nose: No external deformity. CV: RRR, No M/G/R. No JVD. No thrill. No extra heart sounds. PULM: CTA B, no wheezes, crackles, rhonchi. No retractions. No resp. distress. No accessory muscle use. ABD: S, NT, ND Her left lateral ribs at the bra line are tender to palpation.  There is no bruising or swelling at this location EXTR: No c/c/e Skin tear in the palm of the right hand appears to be healing well Her right elbow has normal range of motion, though there is some swelling and bruising of the forearm just distal to the elbow NEURO Normal gait.  PSYCH: Normally interactive. Conversant. Not depressed or anxious appearing.  Calm demeanor.   Assessment and Plan: Contusion of face, initial encounter - Plan: DG Facial Bones Complete, DG  Nasal Bones  Contusion of rib on left side, initial encounter - Plan: DG Ribs Unilateral W/Chest  Left  Fall against object  Amire is here following a fall which occurred 3 days ago.  She lacerated her lip, which was sutured.  She otherwise has various contusions and abrasions. We are concerned she may have a nasal fracture, and also potentially rib fractures.  Will obtain further x-rays for her today Otherwise she is doing all right, is healing up at home.  She does not desire any other medication for pain Advised her to take things easy until she is feeling better.  She is nearly back to her walking program.  I advised her that as her pain allows, she can increase her activity level  Signed Lamar Blinks, MD  Received her films as follows, called patient Possible minimal fracture of her nose, this may be chronic.  No treatment needed She does also have 11th rib fracture.  Lungs are clear, no fluid buildup.  Encouraged her to take deep breaths several times a good day to prevent atelectasis.  She will let us know if any shortness of breath or fever develop Dg Facial Bones Complete  Result Date: 12/23/2018 CLINICAL DATA:  [place] on Monday landing on her face. Facial bruising. Nasal region pain. EXAM: FACIAL BONES COMPLETE 3+V COMPARISON:  Head CT, 08/24/2014 FINDINGS: No convincing fracture.  No bone lesion. Sinuses are clear. Soft tissues are unremarkable. IMPRESSION: Negative. Electronically Signed   By: Lajean Manes M.D.   On: 12/23/2018 16:41   Dg Nasal Bones  Result Date: 12/23/2018 CLINICAL DATA:  Pt fell at [employer] on Monday landed straight on her face. Pt has visible bruising all over face and states that she is having most of her pain around the nasal area. Waters view is on the facial bones order. EXAM: NASAL BONES - 3+ VIEW COMPARISON:  Head CT, 08/24/2014 FINDINGS: Sliver of increased density, which could reflect a minimal fracture, lies along the anterior inferior aspect of the nasal bone. Three other tiny densities are noted in the soft tissues of the anterior nose consistent  with soft tissue calcifications. No other evidence of fracture.  Mild nasal soft tissue swelling. IMPRESSION: 1. Sliver of density consistent with a small bone fragment lies along the anterior margin of the inferior nasal bone consistent with a minimal fracture. This could be a chronic finding or acute. No other evidence of a fracture. Electronically Signed   By: Lajean Manes M.D.   On: 12/23/2018 16:44   Dg Ribs Unilateral W/chest Left  Result Date: 12/23/2018 CLINICAL DATA:  Pt states that she was walking through the parking lot at [facility] on Monday when she tripped and fell and landed straight forward on her face and chest. Pt states that she is having pain around the anterior lower ribs as marked by BB. EXAM: LEFT RIBS AND CHEST - 3+ VIEW COMPARISON:  Chest radiograph, 08/10/2012 FINDINGS: There is an acute, nondisplaced fracture of the anterolateral left eleventh rib, visualized on one view. There are apparent old or subacute fractures of the left lateral seventh through tenth ribs. No bone lesion. Cardiac silhouette is top-normal in size. No mediastinal or hilar masses. Lung base reticular opacities are consistent with atelectasis and/or scarring. Lungs otherwise clear. No pneumothorax. IMPRESSION: Acute nondisplaced fracture of the anterolateral left eleventh rib. No acute cardiopulmonary disease. Electronically Signed   By: Lajean Manes M.D.   On: 12/23/2018 16:39   Dg Elbow Complete Right  Result  Date: 12/20/2018 CLINICAL DATA:  85 y/o  F; fall with right elbow pain and swelling. EXAM: RIGHT ELBOW - COMPLETE 3+ VIEW COMPARISON:  02/05/2015 right elbow radiographs. FINDINGS: There is no evidence of fracture, dislocation, or joint effusion. There is no evidence of arthropathy or other focal bone abnormality. Soft tissue swelling of the proximal dorsal forearm. IMPRESSION: No acute fracture or dislocation identified. Soft tissue swelling of the proximal dorsal forearm. Electronically Signed   By: Kristine Garbe M.D.   On: 12/20/2018 16:31

## 2018-12-23 ENCOUNTER — Encounter: Payer: Self-pay | Admitting: Family Medicine

## 2018-12-23 ENCOUNTER — Ambulatory Visit (HOSPITAL_BASED_OUTPATIENT_CLINIC_OR_DEPARTMENT_OTHER)
Admission: RE | Admit: 2018-12-23 | Discharge: 2018-12-23 | Disposition: A | Discharge status: Home / Self Care | Payer: Medicare Other | Source: Ambulatory Visit | Attending: Family Medicine | Admitting: Family Medicine

## 2018-12-23 ENCOUNTER — Other Ambulatory Visit: Payer: Self-pay

## 2018-12-23 ENCOUNTER — Ambulatory Visit: Payer: Medicare Other | Admitting: Family Medicine

## 2018-12-23 VITALS — BP 110/64 | HR 56 | Temp 97.5°F | Resp 16 | Ht 63.0 in | Wt 112.0 lb

## 2018-12-23 DIAGNOSIS — S20212A Contusion of left front wall of thorax, initial encounter: Secondary | ICD-10-CM | POA: Diagnosis not present

## 2018-12-23 DIAGNOSIS — W1800XA Striking against unspecified object with subsequent fall, initial encounter: Secondary | ICD-10-CM | POA: Diagnosis not present

## 2018-12-23 DIAGNOSIS — S0083XA Contusion of other part of head, initial encounter: Secondary | ICD-10-CM

## 2018-12-23 DIAGNOSIS — S0033XA Contusion of nose, initial encounter: Secondary | ICD-10-CM | POA: Diagnosis not present

## 2018-12-23 DIAGNOSIS — S2232XA Fracture of one rib, left side, initial encounter for closed fracture: Secondary | ICD-10-CM | POA: Diagnosis not present

## 2018-12-23 NOTE — Patient Instructions (Signed)
It was good to see you today- I am so sorry that you fell Please go to x-ray and have films taken of your left rib and your face/ nose  I will be in touch with your reports asap Take it easy, once you are feeling strong again you can get back to walking per your normal routine

## 2019-02-17 NOTE — Progress Notes (Signed)
[place] at [facility] [address] [place], [place] [address] [phone]  Date:  02/21/2019   Name:  [name]   DOB:  1931/09/26   MRN:  [id number]  PCP:  Darreld Mclean, MD    Chief Complaint: No chief complaint on file.   History of Present Illness:  [name] is a 85 y.o. very pleasant female patient who presents with the following:  Pt location is home Provider location is office Pt ID confirmed with name and DOB Periodic follow-up visit today Patient with history of skin cancer, breast cancer diagnosed 2010/cervical cancer 1972, hyperlipidemia, osteoporosis, brain meningioma, hypothyroidism  I last saw her in March, following an ER visit for a fall and contusions, lip laceration She fractured her nose, and one rib She notes that her lip is still a bit tender but overall much improved Her nose is not bothering her at all, she can breathe normally  She really enjoys walking for exercise- she is still getting out and walking about 5 miles a day  Married to her husband, Markus Daft is doing well  Most recent bone density scan was in [place] I offered her anti- resorbtive therapy due to osteopenia and fracture but she declines at this time  She takes calcium and vitamin D, Synthroid, Crestor Most recent MRI of her brain was in 2018, it showed no change of a 7 mm calcified meningioma on the left We did have her see neurology last January for left facial numbness  I will touch base with her neurologist about any further MRI that might be indicated  Lab Results  Component Value Date   TSH 2.42 05/13/2018     Patient Active Problem List   Diagnosis Date Noted  . Squamous cell carcinoma in situ of skin 11/30/2018  . Skin cancer 09/01/2018  . Diarrhea 09/08/2017  . Nausea vomiting and diarrhea 09/08/2017  . Elevated serum creatinine 09/08/2017  . PVC (premature ventricular contraction) 09/08/2017   . Glaucoma 09/08/2017  . Hx of meningioma of the brain 08/28/2017  . Hoarseness of voice 02/28/2016  . Raynaud's phenomenon 02/28/2016  . Esophageal spasm 09/20/2013  . Trigger finger 07/08/2012  . Osteoporosis 03/18/2012  . Fracture, shoulder 03/06/2012  . Hyperlipidemia   . Breast cancer [facility])     Past Medical History:  Diagnosis Date  . Autoimmune disease ([place])   . Barrett's esophagus   . Bone lesion    sacrum  . Breast cancer ([place]) 2010   cT1 N0 M0; ER+/ PR+/ HER2 neg; s/p lumpectomy 07/2010; started adjuvant hormonal therapy 10/2009  . Cervical cancer ([place])   . Dysphagia   . Fracture of left shoulder   . Fx distal radius NEC-closed   . Glaucoma   . Humerus fracture 03/2012   impacted left humueral neck fracture, treated by Dr. Amedeo Plenty  . Hyperlipidemia   . Hypertension   . Neuromuscular disorder ([place])   . Osteopenia   . Osteoporosis   . Osteoporosis 03/18/2012  . Personal history of chemotherapy 2010   Left Breast Cancer  . Personal history of radiation therapy 2010   Left Breast Cancer  . Pneumonia   . Thyroid disease     Past Surgical History:  Procedure Laterality Date  . ABDOMINAL HYSTERECTOMY  1972   cancer          1 tube 1 ovary  . APPENDECTOMY    . BREAST LUMPECTOMY Left 2010  .  colon polyp removal    . EYE SURGERY    . HAND SURGERY    . ortho procedures     multiple  . OVARIAN CYST REMOVAL  1978  . SHOULDER SURGERY  1954   left    Social History   Tobacco Use  . Smoking status: Former Smoker    Last attempt to quit: 12/16/1986    Years since quitting: 32.1  . Smokeless tobacco: Never Used  . Tobacco comment: FORMER SMOKER 30 YEARS AGO  Substance Use Topics  . Alcohol use: Yes    Alcohol/week: 0.0 standard drinks    Comment: OCCASIONALLY  . Drug use: No    Family History  Problem Relation Age of Onset  . Heart disease Father   . Cancer Sister        lung, kidney  . Cancer Brother        brain  . Cancer Paternal Aunt        breast   . Breast cancer Paternal Aunt   . Cancer Brother        liver, lung  . Osteoporosis Neg Hx     Allergies  Allergen Reactions  . Daypro [Oxaprozin] Swelling    Face and tongue  . Prolia [Denosumab]     Medication list has been reviewed and updated.  Current Outpatient Medications on File Prior to Visit  Medication Sig Dispense Refill  . Calcium Carbonate-Vitamin D (CALCIUM + D PO) Take 1 capsule by mouth daily. Taking 2 pills per day    . cephALEXin (KEFLEX) 500 MG capsule Take 2 capsules (1,000 mg total) by mouth 2 (two) times daily. 28 capsule 0  . latanoprost (XALATAN) 0.005 % ophthalmic solution Place 1 drop into both eyes at bedtime.     Marland Kitchen levothyroxine (SYNTHROID, LEVOTHROID) 50 MCG tablet Take 1 tablet (50 mcg total) by mouth daily before breakfast. 90 tablet 3  . Multiple Vitamin (MULTIVITAMIN) capsule Take 1 capsule by mouth daily.    . rosuvastatin (CRESTOR) 10 MG tablet TAKE 1 BY MOUTH DAILY 90 tablet 3  . valACYclovir (VALTREX) 1000 MG tablet Take 1 tablet (1,000 mg total) by mouth 3 (three) times daily. 21 tablet 0   No current facility-administered medications on file prior to visit.     Review of Systems:  As per HPI- otherwise negative. No cough or fever She has felt well  Physical Examination: There were no vitals filed for this visit. There were no vitals filed for this visit. There is no height or weight on file to calculate BMI. Ideal Body Weight:    Spoke with patient over phone today as she was not able to obtain video.  She sounds well, no cough, wheezing, tachypnea is noted  Assessment and Plan: Medication monitoring encounter  Mixed hyperlipidemia - Plan: rosuvastatin (CRESTOR) 10 MG tablet  Hx of meningioma of the brain  Acquired hypothyroidism   I will touch base with Dr. Posey Pronto about her meningioma - ?timing of next MRI if any  Of her to bring [name] in for a lab visit only, versus an in person visit and labs as soon as possible.  She  prefers to come in for a visit, we hope by August Refilled her Crestor Recommended Shingrix vaccine  Spoke with patient for approximately 10 minutes today Signed Lamar Blinks, MD

## 2019-02-21 ENCOUNTER — Other Ambulatory Visit: Payer: Self-pay

## 2019-02-21 ENCOUNTER — Encounter: Payer: Self-pay | Admitting: Family Medicine

## 2019-02-21 ENCOUNTER — Ambulatory Visit (INDEPENDENT_AMBULATORY_CARE_PROVIDER_SITE_OTHER): Payer: Medicare Other | Admitting: Family Medicine

## 2019-02-21 DIAGNOSIS — E782 Mixed hyperlipidemia: Secondary | ICD-10-CM

## 2019-02-21 DIAGNOSIS — Z86011 Personal history of benign neoplasm of the brain: Secondary | ICD-10-CM | POA: Diagnosis not present

## 2019-02-21 DIAGNOSIS — Z5181 Encounter for therapeutic drug level monitoring: Secondary | ICD-10-CM | POA: Diagnosis not present

## 2019-02-21 DIAGNOSIS — E039 Hypothyroidism, unspecified: Secondary | ICD-10-CM | POA: Diagnosis not present

## 2019-02-21 MED ORDER — ROSUVASTATIN CALCIUM 10 MG PO TABS: ORAL_TABLET | ORAL | 3 refills | Status: DC

## 2019-02-21 NOTE — Patient Instructions (Signed)
It was very nice to speak with you today, I hope that you remain in good health!  As we discussed, please come and see me later on this summer, possibly August.  Of course with the pandemic this plan may have to change.  Ideally I would like to see you in the office and catch up on your labs in August  I also want to mention the Shingrix vaccine for shingles to you.  This immunization can be administered at your pharmacy, at your convenience.  It helps protect against the shingles infection-let me know if you have any questions about this

## 2019-02-22 ENCOUNTER — Inpatient Hospital Stay (HOSPITAL_COMMUNITY)
Admission: EM | Admit: 2019-02-22 | Discharge: 2019-02-24 | DRG: 305 | Disposition: A | Discharge status: Home / Self Care | Payer: Medicare Other | Attending: Internal Medicine | Admitting: Internal Medicine

## 2019-02-22 ENCOUNTER — Encounter (HOSPITAL_COMMUNITY): Payer: Self-pay | Admitting: Emergency Medicine

## 2019-02-22 ENCOUNTER — Emergency Department (HOSPITAL_COMMUNITY): Payer: Medicare Other

## 2019-02-22 ENCOUNTER — Telehealth: Payer: Self-pay | Admitting: Family Medicine

## 2019-02-22 ENCOUNTER — Other Ambulatory Visit: Payer: Self-pay

## 2019-02-22 DIAGNOSIS — Z87891 Personal history of nicotine dependence: Secondary | ICD-10-CM | POA: Diagnosis not present

## 2019-02-22 DIAGNOSIS — M810 Age-related osteoporosis without current pathological fracture: Secondary | ICD-10-CM | POA: Diagnosis not present

## 2019-02-22 DIAGNOSIS — N182 Chronic kidney disease, stage 2 (mild): Secondary | ICD-10-CM

## 2019-02-22 DIAGNOSIS — Z9181 History of falling: Secondary | ICD-10-CM

## 2019-02-22 DIAGNOSIS — I7300 Raynaud's syndrome without gangrene: Secondary | ICD-10-CM | POA: Diagnosis not present

## 2019-02-22 DIAGNOSIS — D329 Benign neoplasm of meninges, unspecified: Secondary | ICD-10-CM | POA: Diagnosis present

## 2019-02-22 DIAGNOSIS — H538 Other visual disturbances: Secondary | ICD-10-CM | POA: Diagnosis not present

## 2019-02-22 DIAGNOSIS — Z791 Long term (current) use of non-steroidal anti-inflammatories (NSAID): Secondary | ICD-10-CM

## 2019-02-22 DIAGNOSIS — I129 Hypertensive chronic kidney disease with stage 1 through stage 4 chronic kidney disease, or unspecified chronic kidney disease: Secondary | ICD-10-CM | POA: Diagnosis present

## 2019-02-22 DIAGNOSIS — Z86010 Personal history of colonic polyps: Secondary | ICD-10-CM

## 2019-02-22 DIAGNOSIS — E785 Hyperlipidemia, unspecified: Secondary | ICD-10-CM | POA: Diagnosis present

## 2019-02-22 DIAGNOSIS — Z888 Allergy status to other drugs, medicaments and biological substances status: Secondary | ICD-10-CM

## 2019-02-22 DIAGNOSIS — N390 Urinary tract infection, site not specified: Secondary | ICD-10-CM

## 2019-02-22 DIAGNOSIS — I10 Essential (primary) hypertension: Secondary | ICD-10-CM | POA: Diagnosis not present

## 2019-02-22 DIAGNOSIS — Z923 Personal history of irradiation: Secondary | ICD-10-CM | POA: Diagnosis not present

## 2019-02-22 DIAGNOSIS — I160 Hypertensive urgency: Secondary | ICD-10-CM | POA: Diagnosis not present

## 2019-02-22 DIAGNOSIS — R7989 Other specified abnormal findings of blood chemistry: Secondary | ICD-10-CM | POA: Diagnosis present

## 2019-02-22 DIAGNOSIS — Z8541 Personal history of malignant neoplasm of cervix uteri: Secondary | ICD-10-CM | POA: Diagnosis not present

## 2019-02-22 DIAGNOSIS — I700 Atherosclerosis of aorta: Secondary | ICD-10-CM | POA: Diagnosis not present

## 2019-02-22 DIAGNOSIS — Z9221 Personal history of antineoplastic chemotherapy: Secondary | ICD-10-CM

## 2019-02-22 DIAGNOSIS — R011 Cardiac murmur, unspecified: Secondary | ICD-10-CM | POA: Diagnosis not present

## 2019-02-22 DIAGNOSIS — Z90710 Acquired absence of both cervix and uterus: Secondary | ICD-10-CM | POA: Diagnosis not present

## 2019-02-22 DIAGNOSIS — N183 Chronic kidney disease, stage 3 (moderate): Secondary | ICD-10-CM | POA: Diagnosis not present

## 2019-02-22 DIAGNOSIS — N179 Acute kidney failure, unspecified: Secondary | ICD-10-CM

## 2019-02-22 DIAGNOSIS — R270 Ataxia, unspecified: Secondary | ICD-10-CM | POA: Diagnosis not present

## 2019-02-22 DIAGNOSIS — Z8249 Family history of ischemic heart disease and other diseases of the circulatory system: Secondary | ICD-10-CM | POA: Diagnosis not present

## 2019-02-22 DIAGNOSIS — N1831 Chronic kidney disease, stage 3a: Secondary | ICD-10-CM

## 2019-02-22 DIAGNOSIS — Z03818 Encounter for observation for suspected exposure to other biological agents ruled out: Secondary | ICD-10-CM | POA: Diagnosis not present

## 2019-02-22 DIAGNOSIS — Z1159 Encounter for screening for other viral diseases: Secondary | ICD-10-CM

## 2019-02-22 DIAGNOSIS — K2270 Barrett's esophagus without dysplasia: Secondary | ICD-10-CM | POA: Diagnosis present

## 2019-02-22 DIAGNOSIS — R42 Dizziness and giddiness: Secondary | ICD-10-CM

## 2019-02-22 DIAGNOSIS — E039 Hypothyroidism, unspecified: Secondary | ICD-10-CM | POA: Diagnosis present

## 2019-02-22 DIAGNOSIS — I161 Hypertensive emergency: Secondary | ICD-10-CM | POA: Diagnosis not present

## 2019-02-22 DIAGNOSIS — Z79890 Hormone replacement therapy (postmenopausal): Secondary | ICD-10-CM

## 2019-02-22 DIAGNOSIS — Z853 Personal history of malignant neoplasm of breast: Secondary | ICD-10-CM

## 2019-02-22 DIAGNOSIS — Z803 Family history of malignant neoplasm of breast: Secondary | ICD-10-CM

## 2019-02-22 DIAGNOSIS — E860 Dehydration: Secondary | ICD-10-CM | POA: Diagnosis present

## 2019-02-22 DIAGNOSIS — Z79899 Other long term (current) drug therapy: Secondary | ICD-10-CM

## 2019-02-22 DIAGNOSIS — R030 Elevated blood-pressure reading, without diagnosis of hypertension: Secondary | ICD-10-CM

## 2019-02-22 DIAGNOSIS — R778 Other specified abnormalities of plasma proteins: Secondary | ICD-10-CM | POA: Diagnosis present

## 2019-02-22 DIAGNOSIS — M545 Low back pain: Secondary | ICD-10-CM | POA: Diagnosis present

## 2019-02-22 DIAGNOSIS — I491 Atrial premature depolarization: Secondary | ICD-10-CM | POA: Diagnosis not present

## 2019-02-22 DIAGNOSIS — C50919 Malignant neoplasm of unspecified site of unspecified female breast: Secondary | ICD-10-CM | POA: Diagnosis present

## 2019-02-22 HISTORY — DX: Dizziness and giddiness: R42

## 2019-02-22 LAB — URINALYSIS, ROUTINE W REFLEX MICROSCOPIC
Bilirubin Urine: NEGATIVE
Glucose, UA: NEGATIVE mg/dL
Hgb urine dipstick: NEGATIVE
Ketones, ur: NEGATIVE mg/dL
Nitrite: NEGATIVE
Protein, ur: NEGATIVE mg/dL
Specific Gravity, Urine: 1.005 (ref 1.005–1.030)
pH: 7 (ref 5.0–8.0)

## 2019-02-22 LAB — CBC WITH DIFFERENTIAL/PLATELET
Abs Immature Granulocytes: 0.01 10*3/uL (ref 0.00–0.07)
Basophils Absolute: 0 10*3/uL (ref 0.0–0.1)
Basophils Relative: 1 %
Eosinophils Absolute: 0.2 10*3/uL (ref 0.0–0.5)
Eosinophils Relative: 3 %
HCT: 41.5 % (ref 36.0–46.0)
Hemoglobin: 13.2 g/dL (ref 12.0–15.0)
Immature Granulocytes: 0 %
Lymphocytes Relative: 23 %
Lymphs Abs: 1.3 10*3/uL (ref 0.7–4.0)
MCH: 29.7 pg (ref 26.0–34.0)
MCHC: 31.8 g/dL (ref 30.0–36.0)
MCV: 93.3 fL (ref 80.0–100.0)
Monocytes Absolute: 0.4 10*3/uL (ref 0.1–1.0)
Monocytes Relative: 6 %
Neutro Abs: 3.9 10*3/uL (ref 1.7–7.7)
Neutrophils Relative %: 67 %
Platelets: 237 10*3/uL (ref 150–400)
RBC: 4.45 MIL/uL (ref 3.87–5.11)
RDW: 14.9 % (ref 11.5–15.5)
WBC: 5.8 10*3/uL (ref 4.0–10.5)
nRBC: 0 % (ref 0.0–0.2)

## 2019-02-22 LAB — BASIC METABOLIC PANEL
Anion gap: 9 (ref 5–15)
BUN: 15 mg/dL (ref 8–23)
CO2: 28 mmol/L (ref 22–32)
Calcium: 10.6 mg/dL — ABNORMAL HIGH (ref 8.9–10.3)
Chloride: 103 mmol/L (ref 98–111)
Creatinine, Ser: 1.08 mg/dL — ABNORMAL HIGH (ref 0.44–1.00)
GFR calc Af Amer: 54 mL/min — ABNORMAL LOW (ref >60)
GFR calc non Af Amer: 47 mL/min — ABNORMAL LOW (ref >60)
Glucose, Bld: 89 mg/dL (ref 70–99)
Potassium: 4.4 mmol/L (ref 3.5–5.1)
Sodium: 140 mmol/L (ref 135–145)

## 2019-02-22 LAB — SARS CORONAVIRUS 2 BY RT PCR (HOSPITAL ORDER, PERFORMED IN ~~LOC~~ HOSPITAL LAB): SARS Coronavirus 2: NEGATIVE

## 2019-02-22 LAB — TROPONIN I: Troponin I: 0.14 ng/mL (ref <0.03)

## 2019-02-22 MED ORDER — CEPHALEXIN 500 MG PO CAPS: 500.0000 mg | ORAL_CAPSULE | Freq: Two times a day (BID) | ORAL | 0 refills | Status: DC

## 2019-02-22 MED ORDER — MECLIZINE HCL 25 MG PO TABS
25.0000 mg | ORAL_TABLET | Freq: Once | ORAL | Status: AC
  Administered 2019-02-22: 25 mg via ORAL
  Filled 2019-02-22: qty 1

## 2019-02-22 MED ORDER — ONDANSETRON HCL 4 MG/2ML IJ SOLN: 4.0000 mg | Freq: Four times a day (QID) | INTRAMUSCULAR | Status: DC | PRN

## 2019-02-22 MED ORDER — ACETAMINOPHEN 650 MG RE SUPP: 650.0000 mg | Freq: Four times a day (QID) | RECTAL | Status: DC | PRN

## 2019-02-22 MED ORDER — CEPHALEXIN 250 MG PO CAPS
500.0000 mg | ORAL_CAPSULE | Freq: Once | ORAL | Status: AC
  Administered 2019-02-22: 500 mg via ORAL
  Filled 2019-02-22: qty 2

## 2019-02-22 MED ORDER — ONDANSETRON HCL 4 MG PO TABS: 4.0000 mg | ORAL_TABLET | Freq: Four times a day (QID) | ORAL | Status: DC | PRN

## 2019-02-22 MED ORDER — LEVOTHYROXINE SODIUM 50 MCG PO TABS
50.0000 ug | ORAL_TABLET | Freq: Every day | ORAL | Status: DC
  Administered 2019-02-23 – 2019-02-24 (×2): 50 ug via ORAL
  Filled 2019-02-22 (×2): qty 1

## 2019-02-22 MED ORDER — ENOXAPARIN SODIUM 40 MG/0.4ML ~~LOC~~ SOLN
40.0000 mg | Freq: Every day | SUBCUTANEOUS | Status: DC
  Administered 2019-02-23: 40 mg via SUBCUTANEOUS
  Filled 2019-02-22: qty 0.4

## 2019-02-22 MED ORDER — LATANOPROST 0.005 % OP SOLN
1.0000 [drp] | Freq: Every day | OPHTHALMIC | Status: DC
  Administered 2019-02-23 (×2): 1 [drp] via OPHTHALMIC
  Filled 2019-02-22: qty 2.5

## 2019-02-22 MED ORDER — ROSUVASTATIN CALCIUM 5 MG PO TABS
10.0000 mg | ORAL_TABLET | Freq: Every day | ORAL | Status: DC
  Administered 2019-02-23: 10 mg via ORAL
  Filled 2019-02-22: qty 2

## 2019-02-22 MED ORDER — ACETAMINOPHEN 325 MG PO TABS: 650.0000 mg | ORAL_TABLET | Freq: Four times a day (QID) | ORAL | Status: DC | PRN

## 2019-02-22 MED ORDER — ASPIRIN EC 81 MG PO TBEC
81.0000 mg | DELAYED_RELEASE_TABLET | Freq: Every day | ORAL | Status: DC
  Administered 2019-02-23 – 2019-02-24 (×2): 81 mg via ORAL
  Filled 2019-02-22 (×2): qty 1

## 2019-02-22 NOTE — ED Provider Notes (Signed)
[place] EMERGENCY DEPARTMENT Provider Note   CSN: [id number] Arrival date & time: 02/22/19  1307    History   Chief Complaint Chief Complaint  Patient presents with  . Dizziness    HPI [name] is a 85 y.o. female.     Patient is a 85 year old female with a history of hyperlipidemia, Raynaud disease, prior breast cancer who presents with dizziness and high blood pressure.  She states that she was feeling fine this morning when she woke up.  Around 10-10 30 this morning she started feeling lightheaded.  She described feeling a little bit off balance.  She said that she had a little bit of a sensation of spinning but not bad like her prior episodes of vertigo.  She states the dizziness was similar when she was standing up or sitting down.  She felt just a little bit off balance when she just stood up.  She denies any associated headache.  No facial numbness or speech deficits.  She has had some intermittent episodes for a while of some pain and sometimes numbness in the left side of her face around her jaw but denies any currently.  No vision changes.  She states at times she feels like she cannot see the left side of pages but she says has been going on for quite some time.  She denies any numbness or weakness to her extremities.  No chest pain or shortness of breath.  No recent illnesses.  No cough or cold symptoms.  No fevers.  No urinary symptoms.  She checked her blood pressure at home and it was elevated and then went to a fire department where her blood pressure was 220/100.  She was brought here for further evaluation.     Past Medical History:  Diagnosis Date  . Autoimmune disease ([place])   . Barrett's esophagus   . Bone lesion    sacrum  . Breast cancer ([place]) 2010   cT1 N0 M0; ER+/ PR+/ HER2 neg; s/p lumpectomy 07/2010; started adjuvant hormonal therapy 10/2009  . Cervical cancer ([place])   . Dysphagia   . Fracture of left shoulder   . Fx distal radius  NEC-closed   . Glaucoma   . Humerus fracture 03/2012   impacted left humueral neck fracture, treated by Dr. Amedeo Plenty  . Hyperlipidemia   . Hypertension   . Neuromuscular disorder ([place])   . Osteopenia   . Osteoporosis   . Osteoporosis 03/18/2012  . Personal history of chemotherapy 2010   Left Breast Cancer  . Personal history of radiation therapy 2010   Left Breast Cancer  . Pneumonia   . Thyroid disease     Patient Active Problem List   Diagnosis Date Noted  . Squamous cell carcinoma in situ of skin 11/30/2018  . Diarrhea 09/08/2017  . Nausea vomiting and diarrhea 09/08/2017  . Elevated serum creatinine 09/08/2017  . PVC (premature ventricular contraction) 09/08/2017  . Glaucoma 09/08/2017  . Hx of meningioma of the brain 08/28/2017  . Hoarseness of voice 02/28/2016  . Raynaud's phenomenon 02/28/2016  . Esophageal spasm 09/20/2013  . Osteoporosis 03/18/2012  . Fracture, shoulder 03/06/2012  . Hyperlipidemia   . Breast cancer [facility])     Past Surgical History:  Procedure Laterality Date  . ABDOMINAL HYSTERECTOMY  1972   cancer          1 tube 1 ovary  . APPENDECTOMY    . BREAST LUMPECTOMY Left 2010  . colon polyp  removal    . EYE SURGERY    . HAND SURGERY    . ortho procedures     multiple  . OVARIAN CYST REMOVAL  1978  . [place]   left     OB History   No obstetric history on file.      Home Medications    Prior to Admission medications   Medication Sig Start Date End Date Taking? Authorizing Provider  Calcium Carbonate-Vitamin D (CALCIUM + D PO) Take 1 capsule by mouth daily. Taking 2 pills per day    [provider]  cephALEXin (KEFLEX) 500 MG capsule Take 2 capsules (1,000 mg total) by mouth 2 (two) times daily. 12/20/18   Charlesetta Shanks, MD  latanoprost (XALATAN) 0.005 % ophthalmic solution Place 1 drop into both eyes at bedtime.     [provider]  levothyroxine (SYNTHROID, LEVOTHROID) 50 MCG tablet Take 1 tablet (50 mcg  total) by mouth daily before breakfast. 08/23/18   Copland, Gay Filler, MD  Multiple Vitamin (MULTIVITAMIN) capsule Take 1 capsule by mouth daily.    [provider]  rosuvastatin (CRESTOR) 10 MG tablet TAKE 1 BY MOUTH DAILY 02/21/19   Copland, Gay Filler, MD  valACYclovir (VALTREX) 1000 MG tablet Take 1 tablet (1,000 mg total) by mouth 3 (three) times daily. 05/13/18   Copland, Gay Filler, MD    Family History Family History  Problem Relation Age of Onset  . Heart disease Father   . Cancer Sister        lung, kidney  . Cancer Brother        brain  . Cancer Paternal Aunt        breast  . Breast cancer Paternal Aunt   . Cancer Brother        liver, lung  . Osteoporosis Neg Hx     Social History Social History   Tobacco Use  . Smoking status: Former Smoker    Last attempt to quit: 12/16/1986    Years since quitting: 32.2  . Smokeless tobacco: Never Used  . Tobacco comment: FORMER SMOKER 30 YEARS AGO  Substance Use Topics  . Alcohol use: Yes    Alcohol/week: 0.0 standard drinks    Comment: OCCASIONALLY  . Drug use: No     Allergies   Daypro [oxaprozin] and Prolia [denosumab]   Review of Systems Review of Systems  Constitutional: Negative for chills, diaphoresis, fatigue and fever.  HENT: Negative for congestion, rhinorrhea and sneezing.   Eyes: Negative.   Respiratory: Negative for cough, chest tightness and shortness of breath.   Cardiovascular: Negative for chest pain and leg swelling.  Gastrointestinal: Negative for abdominal pain, blood in stool, diarrhea, nausea and vomiting.  Genitourinary: Negative for difficulty urinating, flank pain, frequency and hematuria.  Musculoskeletal: Negative for arthralgias and back pain.  Skin: Negative for rash.  Neurological: Positive for dizziness and light-headedness. Negative for speech difficulty, weakness, numbness and headaches.     Physical Exam Updated Vital Signs BP (!) 158/79 (BP Location: Right Arm)   Pulse  (!) 53   Temp 97.6 F (36.4 C) (Oral)   Resp 16   Ht _0  (1.6 m)   Wt 49 kg   SpO2 100%   BMI 19.13 kg/m   Physical Exam Constitutional:      Appearance: She is well-developed.  HENT:     Head: Normocephalic and atraumatic.  Eyes:     Pupils: Pupils are equal, round, and reactive to light.  Neck:     Musculoskeletal: Normal range of motion and neck supple.  Cardiovascular:     Rate and Rhythm: Normal rate and regular rhythm.     Heart sounds: Normal heart sounds.  Pulmonary:     Effort: Pulmonary effort is normal. No respiratory distress.     Breath sounds: Normal breath sounds. No wheezing or rales.  Chest:     Chest wall: No tenderness.  Abdominal:     General: Bowel sounds are normal.     Palpations: Abdomen is soft.     Tenderness: There is no abdominal tenderness. There is no guarding or rebound.  Musculoskeletal: Normal range of motion.  Lymphadenopathy:     Cervical: No cervical adenopathy.  Skin:    General: Skin is warm and dry.     Findings: No rash.  Neurological:     Mental Status: She is alert and oriented to person, place, and time.     Comments: Motor 5/5 all extremities Sensation grossly intact to LT all extremities Finger to Nose intact, no pronator drift CN II-XII grossly intact Visual fields full to confrontation       ED Treatments / Results  Labs (all labs ordered are listed, but only abnormal results are displayed) Labs Reviewed  BASIC METABOLIC PANEL - Abnormal; Notable for the following components:      Result Value   Creatinine, Ser 1.08 (*)    Calcium 10.6 (*)    GFR calc non Af Amer 47 (*)    GFR calc Af Amer 54 (*)    All other components within normal limits  URINALYSIS, ROUTINE W REFLEX MICROSCOPIC - Abnormal; Notable for the following components:   Color, Urine STRAW (*)    Leukocytes,Ua LARGE (*)    Bacteria, UA RARE (*)    Non Squamous Epithelial 0-5 (*)    All other components within normal limits  URINE CULTURE   CBC WITH DIFFERENTIAL/PLATELET  TROPONIN I    EKG EKG Interpretation  Date/Time:  Tuesday Feb 22 2019 13:14:51 EDT Ventricular Rate:  55 PR Interval:    QRS Duration: 77 QT Interval:  428 QTC Calculation: 410 R Axis:   65 Text Interpretation:  Sinus rhythm Probable left atrial enlargement since last tracing no significant change Confirmed by Malvin Johns [id number]) on 02/22/2019 1:19:06 PM   Radiology Ct Head Wo Contrast  Result Date: 02/22/2019 CLINICAL DATA:  Dizziness and vertigo EXAM: CT HEAD WITHOUT CONTRAST TECHNIQUE: Contiguous axial images were obtained from the base of the skull through the vertex without intravenous contrast. COMPARISON:  Head CT August 24, 2014 and brain MRI August 22, 2017 FINDINGS: Brain: Mild diffuse atrophy is stable. There is a stable calcified meningioma arising from the anterior tentorium on the left measuring 1.0 x 0.9 cm. There is no associated edema or mass effect from this small benign meningioma. No other mass evident. There is no appreciable hemorrhage, extra-axial fluid collection, midline shift. There is patchy small vessel disease in the centra semiovale bilaterally. No acute infarct is demonstrable on this study. Vascular: There is no appreciable hyperdense vessel. There is calcification in each carotid siphon region. Skull: The bony calvarium appears intact. There is a small enostosis in the left frontal region, stable. Sinuses/Orbits: There is patchy opacity in the posterior left ethmoid region. Other visualized paranasal sinuses are clear. Orbits appear symmetric bilaterally. Other: Mastoid air cells are clear. IMPRESSION: Stable atrophy with patchy periventricular small vessel disease. No evident acute infarct. Small calcified meningioma arising from  the anterior left tentorium without edema or mass effect. No new mass. No hemorrhage. There are foci of arterial vascular calcification. There is patchy left ethmoid sinus disease posteriorly.  Electronically Signed   By: Lowella Grip III M.D.   On: 02/22/2019 15:21    Procedures Procedures (including critical care time)  Medications Ordered in ED Medications  cephALEXin (KEFLEX) capsule 500 mg (has no administration in time range)  meclizine (ANTIVERT) tablet 25 mg (has no administration in time range)     Initial Impression / Assessment and Plan / ED Course  I have reviewed the triage vital signs and the nursing notes.  Pertinent labs & imaging results that were available during my care of the patient were reviewed by me and considered in my medical decision making (see chart for details).        Patient is a 85 year old female who presents with elevated blood pressure associated with lightheadedness.  It is difficult to ascertain whether this is vertiginous symptoms or more lightheadedness although patient says it does not really feel like her prior vertigo.  She has no associated headache.  No other neurologic deficits.  Her blood pressure has improved with monitoring in the ED.  She had a head CT which showed no acute abnormalities.  She did have some persistent lightheadedness and an MRI was performed.  This is pending.  She is able to ambulate but has some dizziness on ambulation.  We will try some meclizine as well.  She has no reported chest pain tightness shortness of breath or other symptoms that would be more concerning for angina.  Her EKG shows no ischemic changes.  Her daughter is concerned that we have not checked a troponin.  I initially explained to her that she does not have any symptoms concerning for ACS.  The daughter is concerned because she is had some intermittent jaw pain.  Patient reports some sharp pain around her TMJ that comes and goes.  This is at times associated with some numbness in her face.  It does not sound anginal in nature.  However we will go ahead and check a troponin.  Pt is awaiting MR results and trial of improvement with  meclizine, as well as troponin.  If these are all ok, pt can likely be discharged home.  Will also treat her UTI which can be contributing to her symptoms.   Final Clinical Impressions(s) / ED Diagnoses   Final diagnoses:  Elevated blood pressure reading  Urinary tract infection without hematuria, site unspecified    ED Discharge Orders    None       Malvin Johns, MD 02/22/19 1906

## 2019-02-22 NOTE — ED Triage Notes (Signed)
Pt complains of lightheadedness that started at 9am. Pts BP elevated 128 systolic at home. Pt went to fire station to have BP checked- 786'V systolic. EMS reports 220/100. Denies hx of HTN. No neuro deficits. Denies CP/SOB. CBG 107.

## 2019-02-22 NOTE — Telephone Encounter (Signed)
Pt daughter [name] returned call to office. CB# [phone]

## 2019-02-22 NOTE — Plan of Care (Signed)
  Problem: Education: Goal: Understanding of CV disease, CV risk reduction, and recovery process will improve Outcome: Progressing Goal: Individualized Educational Video(s) Outcome: Progressing   Problem: Activity: Goal: Ability to return to baseline activity level will improve Outcome: Progressing

## 2019-02-22 NOTE — Telephone Encounter (Signed)
BP Readings from Last 3 Encounters:  02/22/19 (!) 167/88  12/23/18 110/64  12/20/18 (!) 149/73   Called daughter and LMOM Let me know if I can help

## 2019-02-22 NOTE — ED Notes (Signed)
Patient transported to CT 

## 2019-02-22 NOTE — Telephone Encounter (Signed)
Daughter phone number is [phone]

## 2019-02-22 NOTE — H&P (Addendum)
History and Physical    [name] ASN:[id number] DOB: Jan 02, 1931 DOA: 02/22/2019  PCP: Darreld Mclean, MD  Patient coming from: Home.  Chief Complaint: Dizziness.  Elevated blood pressure.  HPI: [name] is a 85 y.o. female with history of hyperlipidemia and hypothyroidism who walks daily about 7 miles started feeling dizzy since this morning.  Patient states 3 to 4 days ago patient had some upper back pain and has been monitoring her blood pressure which has been increasing last 2 days.  Concerned with that patient had gone to the fire station where patient has blood pressure was found to be elevated and was transferred to the ER.  Patient also was finding it difficult to walk because of dizziness.  Denies any headache or visual symptoms.  Denies any weakness of the upper or lower extremity.  Lower back pain which patient had 3 to 4 days ago resolved after patient took ibuprofen.  ED Course: In the ER patient blood pressure 191/89 which improved without any intervention.  UA showing features concerning for UTI.  CT head and MRI brain does not show anything acute but shows a 7 mm.  Meningioma which is appears to be chronic.  EKG shows sinus bradycardia heart rate around 55 bpm.  Troponin turned out to be mildly positive at 1.14.  Cardiology was consulted by ER physician who recommended trending troponins and admitting for observation.  In addition patient's labs show mildly elevated calcium of 10.6 creatinine is 1.08.  Review of Systems: As per HPI, rest all negative.   Past Medical History:  Diagnosis Date   Autoimmune disease ([place])    Barrett's esophagus    Bone lesion    sacrum   Breast cancer ([place]) 2010   cT1 N0 M0; ER+/ PR+/ HER2 neg; s/p lumpectomy 07/2010; started adjuvant hormonal therapy 10/2009   Cervical cancer ([place])    Dysphagia    Fracture of left shoulder    Fx distal radius NEC-closed    Glaucoma    Humerus fracture 03/2012   impacted left humueral  neck fracture, treated by Dr. Amedeo Plenty   Hyperlipidemia    Hypertension    Neuromuscular disorder ([place])    Osteopenia    Osteoporosis    Osteoporosis 03/18/2012   Personal history of chemotherapy 2010   Left Breast Cancer   Personal history of radiation therapy 2010   Left Breast Cancer   Pneumonia    Thyroid disease     Past Surgical History:  Procedure Laterality Date   ABDOMINAL HYSTERECTOMY  1972   cancer          1 tube 1 ovary   APPENDECTOMY     BREAST LUMPECTOMY Left 2010   colon polyp removal     EYE SURGERY     HAND SURGERY     ortho procedures     multiple   [place]   left     reports that she quit smoking about 32 years ago. She has never used smokeless tobacco. She reports current alcohol use. She reports that she does not use drugs.  Allergies  Allergen Reactions   Daypro [Oxaprozin] Swelling    Face and tongue   Prolia [Denosumab]     Family History  Problem Relation Age of Onset   Heart disease Father    Cancer Sister        lung, kidney   Cancer Brother  brain   Cancer Paternal Aunt        breast   Breast cancer Paternal Aunt    Cancer Brother        liver, lung   Osteoporosis Neg Hx     Prior to Admission medications   Medication Sig Start Date End Date Taking? Authorizing Provider  Calcium Carbonate-Vitamin D (CALCIUM + D PO) Take 1 capsule by mouth daily. Taking 2 pills per day    [provider]  cephALEXin (KEFLEX) 500 MG capsule Take 1 capsule (500 mg total) by mouth 2 (two) times daily. 02/22/19   Malvin Johns, MD  latanoprost (XALATAN) 0.005 % ophthalmic solution Place 1 drop into both eyes at bedtime.     [provider]  levothyroxine (SYNTHROID, LEVOTHROID) 50 MCG tablet Take 1 tablet (50 mcg total) by mouth daily before breakfast. 08/23/18   Copland, Gay Filler, MD  Multiple Vitamin (MULTIVITAMIN) capsule Take 1 capsule by mouth daily.     [provider]  rosuvastatin (CRESTOR) 10 MG tablet TAKE 1 BY MOUTH DAILY 02/21/19   Copland, Gay Filler, MD  valACYclovir (VALTREX) 1000 MG tablet Take 1 tablet (1,000 mg total) by mouth 3 (three) times daily. 05/13/18   CoplandGay Filler, MD    Physical Exam: Vitals:   02/22/19 1844 02/22/19 1845 02/22/19 1854 02/22/19 2243  BP: (!) 172/73 (!) 172/73 (!) 158/79 (!) 165/93  Pulse: (!) 56 (!) 54 (!) 53   Resp: 16  16   Temp:      TempSrc:      SpO2: 100% 100% 100%   Weight:      Height:          Constitutional: Moderately built and nourished. Vitals:   02/22/19 1844 02/22/19 1845 02/22/19 1854 02/22/19 2243  BP: (!) 172/73 (!) 172/73 (!) 158/79 (!) 165/93  Pulse: (!) 56 (!) 54 (!) 53   Resp: 16  16   Temp:      TempSrc:      SpO2: 100% 100% 100%   Weight:      Height:       Eyes: Anicteric no pallor. ENMT: No discharge from the ears eyes nose and mouth. Neck: No JVD appreciated no mass felt. Respiratory: No rhonchi or crepitations. Cardiovascular: S1-S2 heard. Abdomen: Soft nontender bowel sounds present. Musculoskeletal: No edema.  No joint effusion. Skin: No rash. Neurologic: Alert awake oriented to time place and person.  Moves all extremities 5 x 5.  No facial asymmetry tongue is midline pupils equal reacting to light. Psychiatric: Appears normal per normal affect.   Labs on Admission: I have personally reviewed following labs and imaging studies  CBC: Recent Labs  Lab 02/22/19 1431  WBC 5.8  NEUTROABS 3.9  HGB 13.2  HCT 41.5  MCV 93.3  PLT 885   Basic Metabolic Panel: Recent Labs  Lab 02/22/19 1431  NA 140  K 4.4  CL 103  CO2 28  GLUCOSE 89  BUN 15  CREATININE 1.08*  CALCIUM 10.6*   GFR: Estimated Creatinine Clearance: 29.5 mL/min (A) (by C-G formula based on SCr of 1.08 mg/dL (H)). Liver Function Tests: No results for input(s): AST, ALT, ALKPHOS, BILITOT, PROT, ALBUMIN in the last 168 hours. No results for input(s): LIPASE,  AMYLASE in the last 168 hours. No results for input(s): AMMONIA in the last 168 hours. Coagulation Profile: No results for input(s): INR, PROTIME in the last 168 hours. Cardiac Enzymes: Recent Labs  Lab 02/22/19 1949  TROPONINI  0.14*   BNP (last 3 results) No results for input(s): PROBNP in the last 8760 hours. HbA1C: No results for input(s): HGBA1C in the last 72 hours. CBG: No results for input(s): GLUCAP in the last 168 hours. Lipid Profile: No results for input(s): CHOL, HDL, LDLCALC, TRIG, CHOLHDL, LDLDIRECT in the last 72 hours. Thyroid Function Tests: No results for input(s): TSH, T4TOTAL, FREET4, T3FREE, THYROIDAB in the last 72 hours. Anemia Panel: No results for input(s): VITAMINB12, FOLATE, FERRITIN, TIBC, IRON, RETICCTPCT in the last 72 hours. Urine analysis:    Component Value Date/Time   COLORURINE STRAW (A) 02/22/2019 1404   APPEARANCEUR CLEAR 02/22/2019 1404   LABSPEC 1.005 02/22/2019 1404   LABSPEC 1.010 06/05/2010 1019   PHURINE 7.0 02/22/2019 1404   GLUCOSEU NEGATIVE 02/22/2019 1404   HGBUR NEGATIVE 02/22/2019 1404   BILIRUBINUR NEGATIVE 02/22/2019 1404   BILIRUBINUR negative 02/28/2016 0945   BILIRUBINUR Negative 06/05/2010 1019   KETONESUR NEGATIVE 02/22/2019 1404   PROTEINUR NEGATIVE 02/22/2019 1404   UROBILINOGEN 0.2 02/28/2016 0945   UROBILINOGEN 0.2 08/24/2014 1425   NITRITE NEGATIVE 02/22/2019 1404   LEUKOCYTESUR LARGE (A) 02/22/2019 1404   LEUKOCYTESUR Negative 06/05/2010 1019   Sepsis Labs: '@LABRCNTIP'$ (procalcitonin:4,lacticidven:4) )No results found for this or any previous visit (from the past 240 hour(s)).   Radiological Exams on Admission: Dg Chest 2 View  Result Date: 02/22/2019 CLINICAL DATA:  Hypertension.  Positive troponin today. EXAM: CHEST - 2 VIEW COMPARISON:  Single-view of the chest and two views of the left ribs 12/23/2018. FINDINGS: The lungs are clear. Heart size is upper normal. Aortic atherosclerosis noted. No  pneumothorax or pleural effusion. Surgical clips in the left breast again seen. Lower left rib fracture seen on the comparison study is not visible on this exam. IMPRESSION: No acute disease. Electronically Signed   By: Inge Rise M.D.   On: 02/22/2019 21:34   Ct Head Wo Contrast  Result Date: 02/22/2019 CLINICAL DATA:  Dizziness and vertigo EXAM: CT HEAD WITHOUT CONTRAST TECHNIQUE: Contiguous axial images were obtained from the base of the skull through the vertex without intravenous contrast. COMPARISON:  Head CT August 24, 2014 and brain MRI August 22, 2017 FINDINGS: Brain: Mild diffuse atrophy is stable. There is a stable calcified meningioma arising from the anterior tentorium on the left measuring 1.0 x 0.9 cm. There is no associated edema or mass effect from this small benign meningioma. No other mass evident. There is no appreciable hemorrhage, extra-axial fluid collection, midline shift. There is patchy small vessel disease in the centra semiovale bilaterally. No acute infarct is demonstrable on this study. Vascular: There is no appreciable hyperdense vessel. There is calcification in each carotid siphon region. Skull: The bony calvarium appears intact. There is a small enostosis in the left frontal region, stable. Sinuses/Orbits: There is patchy opacity in the posterior left ethmoid region. Other visualized paranasal sinuses are clear. Orbits appear symmetric bilaterally. Other: Mastoid air cells are clear. IMPRESSION: Stable atrophy with patchy periventricular small vessel disease. No evident acute infarct. Small calcified meningioma arising from the anterior left tentorium without edema or mass effect. No new mass. No hemorrhage. There are foci of arterial vascular calcification. There is patchy left ethmoid sinus disease posteriorly. Electronically Signed   By: Lowella Grip III M.D.   On: 02/22/2019 15:21   Mr Brain Wo Contrast  Result Date: 02/22/2019 CLINICAL DATA:  Ataxia.   Lightheadedness.  Hypertension. EXAM: MRI HEAD WITHOUT CONTRAST TECHNIQUE: Multiplanar, multiecho pulse sequences of the brain and  surrounding structures were obtained without intravenous contrast. COMPARISON:  Head CT 02/22/2019 and MRI 08/22/2017 FINDINGS: Brain: There is no evidence of acute infarct, intracranial hemorrhage, midline shift, or extra-axial fluid collection. There is mild cerebral atrophy. 7 mm calcified lesion along the left tentorium is unchanged from the prior MRI and is without associated brain edema or significant mass effect. Cerebral white matter T2 hyperintensities are unchanged from the prior MRI and nonspecific but compatible with mild chronic small vessel ischemic disease. Vascular: Major intracranial vascular flow voids are preserved. Skull and upper cervical spine: Unremarkable bone marrow signal. Sinuses/Orbits: Bilateral cataract extraction. Small left maxillary sinus mucous retention cyst. Clear mastoid air cells. Other: None. IMPRESSION: 1. No acute intracranial abnormality. 2. Mild chronic small vessel ischemic disease. 3. Unchanged 7 mm calcified meningioma along the left tentorium. Electronically Signed   By: Logan Bores M.D.   On: 02/22/2019 19:01    EKG: Independently reviewed.  Sinus bradycardia heart rate around 55 bpm.  Assessment/Plan Active Problems:   Breast cancer ([facility])   Raynaud's phenomenon   Hypertensive urgency   Dizziness   Elevated troponin    1. Dizziness -MRI of the brain does not show anything acute.  Will check orthostatics and get physical therapy consult.  Patient following admission has been able to ambulate without help at the time of my exam. 2. Elevated troponin with recent upper back pain -we will trend troponins keep patient on aspirin will get cardiology input.  Patient is on statins. 3. Hypertensive urgency -has not had any previous history of hypertension.  Will closely monitor blood pressure trends we will keep patient on PRN IV  hydralazine for now. 4. Hyperlipidemia on statins. 5. Hypothyroidism on Synthroid. 6. History of Raynaud's phenomenon.  Has been having some cold upper extremities with discoloration off and on as per the daughter. 7. Mild hypercalcemia -gently hydrate recheck metabolic panel check PTH. 8. Possible chronic kidney disease stage III -no new labs available in care everywhere is in 2010.  Monitor metabolic panel. 9. Possible UTI on ceftriaxone.  Follow urine cultures.   DVT prophylaxis: Lovenox. Code Status: Full code. Family Communication: Patient's daughter who also provided the history along with patient. Disposition Plan: Home. Consults called: Cardiology. Admission status: Observation.   Rise Patience MD [facility] Pager [phone].  If 7PM-7AM, please contact night-coverage [email or web address] Password [email or web address]  02/22/2019, 11:13 PM

## 2019-02-22 NOTE — Telephone Encounter (Signed)
Pt daughter called and stated that the patient went to the fire department and bp was 250/120. Pt is now at the hospital. Please advise

## 2019-02-22 NOTE — ED Notes (Signed)
This RN spoke with pt's daughter via phone to update her on plan of care. Informed daughter that Dr Hal Hope will be the admitting MD. Daughter asked that he discuss plan of care with her via telephone as pt may not express the pt's concerns effectively.

## 2019-02-22 NOTE — ED Notes (Signed)
Pt ambulated in hall with one assist.  Pt was somewhat unsteady and stated she was still lightheaded. She stated she felt better than she did earlier today, but not back to normal.

## 2019-02-22 NOTE — ED Notes (Signed)
Advised rn Ronalee Belts to please call daughter Darlyn Chamber at [phone]

## 2019-02-22 NOTE — Telephone Encounter (Signed)
FYI

## 2019-02-22 NOTE — ED Provider Notes (Signed)
[name] is a 85 y.o. female, presenting to the ED with lightheadedness earlier in the day.  HPI from Malvin Johns, MD: "Patient is a 85 year old female with a history of hyperlipidemia, Raynaud disease, prior breast cancer who presents with dizziness and high blood pressure.  She states that she was feeling fine this morning when she woke up.  Around 10-10 30 this morning she started feeling lightheaded.  She described feeling a little bit off balance.  She said that she had a little bit of a sensation of spinning but not bad like her prior episodes of vertigo.  She states the dizziness was similar when she was standing up or sitting down.  She felt just a little bit off balance when she just stood up.  She denies any associated headache.  No facial numbness or speech deficits.  She has had some intermittent episodes for a while of some pain and sometimes numbness in the left side of her face around her jaw but denies any currently.  No vision changes.  She states at times she feels like she cannot see the left side of pages but she says has been going on for quite some time.  She denies any numbness or weakness to her extremities.  No chest pain or shortness of breath.  No recent illnesses.  No cough or cold symptoms.  No fevers.  No urinary symptoms.  She checked her blood pressure at home and it was elevated and then went to a fire department where her blood pressure was 220/100.  She was brought here for further evaluation."  Past Medical History:  Diagnosis Date  . Autoimmune disease ([place])   . Barrett's esophagus   . Bone lesion    sacrum  . Breast cancer ([place]) 2010   cT1 N0 M0; ER+/ PR+/ HER2 neg; s/p lumpectomy 07/2010; started adjuvant hormonal therapy 10/2009  . Cervical cancer ([place])   . Dysphagia   . Fracture of left shoulder   . Fx distal radius NEC-closed   . Glaucoma   . Humerus fracture 03/2012   impacted left humueral neck fracture, treated by Dr. Amedeo Plenty  . Hyperlipidemia   .  Hypertension   . Neuromuscular disorder ([place])   . Osteopenia   . Osteoporosis   . Osteoporosis 03/18/2012  . Personal history of chemotherapy 2010   Left Breast Cancer  . Personal history of radiation therapy 2010   Left Breast Cancer  . Pneumonia   . Thyroid disease     Physical Exam  BP (!) 158/79 (BP Location: Right Arm)   Pulse (!) 53   Temp 97.6 F (36.4 C) (Oral)   Resp 16   Ht _0  (1.6 m)   Wt 49 kg   SpO2 100%   BMI 19.13 kg/m   Physical Exam Vitals signs and nursing note reviewed.  Constitutional:      General: She is not in acute distress.    Appearance: She is well-developed. She is not diaphoretic.  HENT:     Head: Normocephalic and atraumatic.     Mouth/Throat:     Mouth: Mucous membranes are moist.     Pharynx: Oropharynx is clear.  Eyes:     Conjunctiva/sclera: Conjunctivae normal.  Neck:     Musculoskeletal: Neck supple.  Cardiovascular:     Rate and Rhythm: Normal rate and regular rhythm.     Pulses: Normal pulses.          Radial pulses are 2+ on the  right side and 2+ on the left side.       Posterior tibial pulses are 2+ on the right side and 2+ on the left side.     Heart sounds: Normal heart sounds.     Comments: Tactile temperature in the extremities appropriate and equal bilaterally. Pulmonary:     Effort: Pulmonary effort is normal. No respiratory distress.     Breath sounds: Normal breath sounds.  Abdominal:     Palpations: Abdomen is soft.     Tenderness: There is no abdominal tenderness. There is no guarding.  Musculoskeletal:     Right lower leg: No edema.     Left lower leg: No edema.  Lymphadenopathy:     Cervical: No cervical adenopathy.  Skin:    General: Skin is warm and dry.  Neurological:     Mental Status: She is alert and oriented to person, place, and time.  Psychiatric:        Mood and Affect: Mood and affect normal.        Speech: Speech normal.        Behavior: Behavior normal.     ED Course/Procedures      .Critical Care Performed by: Lorayne Bender, PA-C Authorized by: Lorayne Bender, PA-C   Critical care provider statement:    Critical care time (minutes):  35   Critical care time was exclusive of:  Separately billable procedures and treating other patients   Critical care was necessary to treat or prevent imminent or life-threatening deterioration of the following conditions: Positive troponin.   Critical care was time spent personally by me on the following activities:  Development of treatment plan with patient or surrogate, discussions with consultants, examination of patient, obtaining history from patient or surrogate, ordering and review of laboratory studies, ordering and review of radiographic studies and pulse oximetry   I assumed direction of critical care for this patient from another provider in my specialty: no       Abnormal Labs Reviewed  BASIC METABOLIC PANEL - Abnormal; Notable for the following components:      Result Value   Creatinine, Ser 1.08 (*)    Calcium 10.6 (*)    GFR calc non Af Amer 47 (*)    GFR calc Af Amer 54 (*)    All other components within normal limits  URINALYSIS, ROUTINE W REFLEX MICROSCOPIC - Abnormal; Notable for the following components:   Color, Urine STRAW (*)    Leukocytes,Ua LARGE (*)    Bacteria, UA RARE (*)    Non Squamous Epithelial 0-5 (*)    All other components within normal limits  TROPONIN I - Abnormal; Notable for the following components:   Troponin I 0.14 (*)    All other components within normal limits   Dg Chest 2 View  Result Date: 02/22/2019 CLINICAL DATA:  Hypertension.  Positive troponin today. EXAM: CHEST - 2 VIEW COMPARISON:  Single-view of the chest and two views of the left ribs 12/23/2018. FINDINGS: The lungs are clear. Heart size is upper normal. Aortic atherosclerosis noted. No pneumothorax or pleural effusion. Surgical clips in the left breast again seen. Lower left rib fracture seen on the comparison study  is not visible on this exam. IMPRESSION: No acute disease. Electronically Signed   By: Inge Rise M.D.   On: 02/22/2019 21:34   Ct Head Wo Contrast  Result Date: 02/22/2019 CLINICAL DATA:  Dizziness and vertigo EXAM: CT HEAD WITHOUT CONTRAST TECHNIQUE: Contiguous axial images  were obtained from the base of the skull through the vertex without intravenous contrast. COMPARISON:  Head CT August 24, 2014 and brain MRI August 22, 2017 FINDINGS: Brain: Mild diffuse atrophy is stable. There is a stable calcified meningioma arising from the anterior tentorium on the left measuring 1.0 x 0.9 cm. There is no associated edema or mass effect from this small benign meningioma. No other mass evident. There is no appreciable hemorrhage, extra-axial fluid collection, midline shift. There is patchy small vessel disease in the centra semiovale bilaterally. No acute infarct is demonstrable on this study. Vascular: There is no appreciable hyperdense vessel. There is calcification in each carotid siphon region. Skull: The bony calvarium appears intact. There is a small enostosis in the left frontal region, stable. Sinuses/Orbits: There is patchy opacity in the posterior left ethmoid region. Other visualized paranasal sinuses are clear. Orbits appear symmetric bilaterally. Other: Mastoid air cells are clear. IMPRESSION: Stable atrophy with patchy periventricular small vessel disease. No evident acute infarct. Small calcified meningioma arising from the anterior left tentorium without edema or mass effect. No new mass. No hemorrhage. There are foci of arterial vascular calcification. There is patchy left ethmoid sinus disease posteriorly. Electronically Signed   By: Lowella Grip III M.D.   On: 02/22/2019 15:21   Mr Brain Wo Contrast  Result Date: 02/22/2019 CLINICAL DATA:  Ataxia.  Lightheadedness.  Hypertension. EXAM: MRI HEAD WITHOUT CONTRAST TECHNIQUE: Multiplanar, multiecho pulse sequences of the brain and  surrounding structures were obtained without intravenous contrast. COMPARISON:  Head CT 02/22/2019 and MRI 08/22/2017 FINDINGS: Brain: There is no evidence of acute infarct, intracranial hemorrhage, midline shift, or extra-axial fluid collection. There is mild cerebral atrophy. 7 mm calcified lesion along the left tentorium is unchanged from the prior MRI and is without associated brain edema or significant mass effect. Cerebral white matter T2 hyperintensities are unchanged from the prior MRI and nonspecific but compatible with mild chronic small vessel ischemic disease. Vascular: Major intracranial vascular flow voids are preserved. Skull and upper cervical spine: Unremarkable bone marrow signal. Sinuses/Orbits: Bilateral cataract extraction. Small left maxillary sinus mucous retention cyst. Clear mastoid air cells. Other: None. IMPRESSION: 1. No acute intracranial abnormality. 2. Mild chronic small vessel ischemic disease. 3. Unchanged 7 mm calcified meningioma along the left tentorium. Electronically Signed   By: Logan Bores M.D.   On: 02/22/2019 19:01    EKG Interpretation  Date/Time:  Tuesday Feb 22 2019 13:14:51 EDT Ventricular Rate:  55 PR Interval:    QRS Duration: 77 QT Interval:  428 QTC Calculation: 410 R Axis:   65 Text Interpretation:  Sinus rhythm Probable left atrial enlargement since last tracing no significant change Confirmed by Malvin Johns [phone]) on 02/22/2019 1:19:06 PM       MDM   Clinical Course as of Feb 22 2311  Tue Feb 22, 2019  2119 Spoke with Dr. Elson Areas, cardiology fellow. Agrees with the suspicion that this elevated troponin is likely due to a previous hypertensive emergency/urgency.  Since her blood pressure has come down, we would expect troponin to be stable or improved.  We will not treat this as a NSTEMI at this time.  Advises to admit to Medicine service. Contact cardiology should troponins rise or patient becomes symptomatic.   [SJ]  2142 Spoke with  Hal Hope, hospitalist.  Agrees to admit the patient.   [SJ]    Clinical Course User Index [SJ] Lorayne Bender, PA-C    Patient care handoff report received from [facility]  Tamera Punt, MD. Plan: Awaiting troponin.    Patient experienced lightheadedness earlier in the day.  She also noted several hypertensive blood pressure measurements.  Blood pressure improved without intervention here in the ED.  Findings suspicious for UTI noted on UA, treatment initiated.  Patient was noted to have a positive troponin of 0.14.  Suspect patient may have had an episode of hypertensive emergency earlier in the day causing troponin leak.  No acute abnormalities on EKG.  Asymptomatic during my time with the patient. Cardiology consulted.  Admitted via medicine service.   Vitals:   02/22/19 1652 02/22/19 1715 02/22/19 1844 02/22/19 1854  BP: (!) 154/80 (!) 167/88 (!) 172/73 (!) 158/79  Pulse: (!) 54 (!) 51 (!) 56 (!) 53  Resp: _0 Temp:      TempSrc:      SpO2: 97% 100% 100% 100%  Weight:      Height:          Lorayne Bender, PA-C 02/22/19 2318    Malvin Johns, MD 02/23/19 1107

## 2019-02-22 NOTE — Telephone Encounter (Signed)
Pt. Reports she has a brand new BP cuff and is getting elevated readings. 188/88 this morning. Pt. Is asymptomatic. Spoke with Ivin Booty in the practice who recommends she go to her closest fore station and they will check her cuff and her BP. Verbalizes understanding.

## 2019-02-22 NOTE — ED Notes (Signed)
Patient transported to MRI 

## 2019-02-23 ENCOUNTER — Observation Stay (HOSPITAL_COMMUNITY): Payer: Medicare Other

## 2019-02-23 DIAGNOSIS — E785 Hyperlipidemia, unspecified: Secondary | ICD-10-CM | POA: Diagnosis present

## 2019-02-23 DIAGNOSIS — Z87891 Personal history of nicotine dependence: Secondary | ICD-10-CM | POA: Diagnosis not present

## 2019-02-23 DIAGNOSIS — Z9221 Personal history of antineoplastic chemotherapy: Secondary | ICD-10-CM | POA: Diagnosis not present

## 2019-02-23 DIAGNOSIS — R7989 Other specified abnormal findings of blood chemistry: Secondary | ICD-10-CM | POA: Diagnosis present

## 2019-02-23 DIAGNOSIS — Z8541 Personal history of malignant neoplasm of cervix uteri: Secondary | ICD-10-CM | POA: Diagnosis not present

## 2019-02-23 DIAGNOSIS — R42 Dizziness and giddiness: Secondary | ICD-10-CM | POA: Diagnosis present

## 2019-02-23 DIAGNOSIS — R011 Cardiac murmur, unspecified: Secondary | ICD-10-CM

## 2019-02-23 DIAGNOSIS — I161 Hypertensive emergency: Secondary | ICD-10-CM | POA: Diagnosis present

## 2019-02-23 DIAGNOSIS — I160 Hypertensive urgency: Secondary | ICD-10-CM | POA: Diagnosis present

## 2019-02-23 DIAGNOSIS — Z90710 Acquired absence of both cervix and uterus: Secondary | ICD-10-CM | POA: Diagnosis not present

## 2019-02-23 DIAGNOSIS — Z86010 Personal history of colonic polyps: Secondary | ICD-10-CM | POA: Diagnosis not present

## 2019-02-23 DIAGNOSIS — N179 Acute kidney failure, unspecified: Secondary | ICD-10-CM

## 2019-02-23 DIAGNOSIS — N182 Chronic kidney disease, stage 2 (mild): Secondary | ICD-10-CM | POA: Diagnosis not present

## 2019-02-23 DIAGNOSIS — E039 Hypothyroidism, unspecified: Secondary | ICD-10-CM | POA: Diagnosis present

## 2019-02-23 DIAGNOSIS — I700 Atherosclerosis of aorta: Secondary | ICD-10-CM | POA: Diagnosis not present

## 2019-02-23 DIAGNOSIS — D329 Benign neoplasm of meninges, unspecified: Secondary | ICD-10-CM | POA: Diagnosis present

## 2019-02-23 DIAGNOSIS — E860 Dehydration: Secondary | ICD-10-CM | POA: Diagnosis present

## 2019-02-23 DIAGNOSIS — M810 Age-related osteoporosis without current pathological fracture: Secondary | ICD-10-CM | POA: Diagnosis present

## 2019-02-23 DIAGNOSIS — I7300 Raynaud's syndrome without gangrene: Secondary | ICD-10-CM | POA: Diagnosis present

## 2019-02-23 DIAGNOSIS — Z923 Personal history of irradiation: Secondary | ICD-10-CM | POA: Diagnosis not present

## 2019-02-23 DIAGNOSIS — I129 Hypertensive chronic kidney disease with stage 1 through stage 4 chronic kidney disease, or unspecified chronic kidney disease: Secondary | ICD-10-CM | POA: Diagnosis present

## 2019-02-23 DIAGNOSIS — Z853 Personal history of malignant neoplasm of breast: Secondary | ICD-10-CM | POA: Diagnosis not present

## 2019-02-23 DIAGNOSIS — N390 Urinary tract infection, site not specified: Secondary | ICD-10-CM | POA: Diagnosis present

## 2019-02-23 DIAGNOSIS — Z888 Allergy status to other drugs, medicaments and biological substances status: Secondary | ICD-10-CM | POA: Diagnosis not present

## 2019-02-23 DIAGNOSIS — Z8249 Family history of ischemic heart disease and other diseases of the circulatory system: Secondary | ICD-10-CM | POA: Diagnosis not present

## 2019-02-23 DIAGNOSIS — N183 Chronic kidney disease, stage 3 (moderate): Secondary | ICD-10-CM | POA: Diagnosis present

## 2019-02-23 DIAGNOSIS — Z1159 Encounter for screening for other viral diseases: Secondary | ICD-10-CM | POA: Diagnosis not present

## 2019-02-23 HISTORY — DX: Cardiac murmur, unspecified: R01.1

## 2019-02-23 LAB — CBC
HCT: 41.6 % (ref 36.0–46.0)
Hemoglobin: 13.4 g/dL (ref 12.0–15.0)
MCH: 29.6 pg (ref 26.0–34.0)
MCHC: 32.2 g/dL (ref 30.0–36.0)
MCV: 91.8 fL (ref 80.0–100.0)
Platelets: 233 10*3/uL (ref 150–400)
RBC: 4.53 MIL/uL (ref 3.87–5.11)
RDW: 14.6 % (ref 11.5–15.5)
WBC: 7.2 10*3/uL (ref 4.0–10.5)
nRBC: 0 % (ref 0.0–0.2)

## 2019-02-23 LAB — CBC WITH DIFFERENTIAL/PLATELET
Abs Immature Granulocytes: 0.02 10*3/uL (ref 0.00–0.07)
Basophils Absolute: 0 10*3/uL (ref 0.0–0.1)
Basophils Relative: 1 %
Eosinophils Absolute: 0.2 10*3/uL (ref 0.0–0.5)
Eosinophils Relative: 3 %
HCT: 41.6 % (ref 36.0–46.0)
Hemoglobin: 13.4 g/dL (ref 12.0–15.0)
Immature Granulocytes: 0 %
Lymphocytes Relative: 16 %
Lymphs Abs: 1.1 10*3/uL (ref 0.7–4.0)
MCH: 29.8 pg (ref 26.0–34.0)
MCHC: 32.2 g/dL (ref 30.0–36.0)
MCV: 92.7 fL (ref 80.0–100.0)
Monocytes Absolute: 0.5 10*3/uL (ref 0.1–1.0)
Monocytes Relative: 8 %
Neutro Abs: 4.9 10*3/uL (ref 1.7–7.7)
Neutrophils Relative %: 72 %
Platelets: 237 10*3/uL (ref 150–400)
RBC: 4.49 MIL/uL (ref 3.87–5.11)
RDW: 14.9 % (ref 11.5–15.5)
WBC: 6.7 10*3/uL (ref 4.0–10.5)
nRBC: 0 % (ref 0.0–0.2)

## 2019-02-23 LAB — CREATININE, SERUM
Creatinine, Ser: 1.08 mg/dL — ABNORMAL HIGH (ref 0.44–1.00)
GFR calc Af Amer: 54 mL/min — ABNORMAL LOW (ref >60)
GFR calc non Af Amer: 47 mL/min — ABNORMAL LOW (ref >60)

## 2019-02-23 LAB — COMPREHENSIVE METABOLIC PANEL
ALT: 21 U/L (ref 0–44)
AST: 37 U/L (ref 15–41)
Albumin: 3.5 g/dL (ref 3.5–5.0)
Alkaline Phosphatase: 98 U/L (ref 38–126)
Anion gap: 14 (ref 5–15)
BUN: 19 mg/dL (ref 8–23)
CO2: 26 mmol/L (ref 22–32)
Calcium: 9.5 mg/dL (ref 8.9–10.3)
Chloride: 101 mmol/L (ref 98–111)
Creatinine, Ser: 1.24 mg/dL — ABNORMAL HIGH (ref 0.44–1.00)
GFR calc Af Amer: 46 mL/min — ABNORMAL LOW (ref >60)
GFR calc non Af Amer: 40 mL/min — ABNORMAL LOW (ref >60)
Glucose, Bld: 79 mg/dL (ref 70–99)
Potassium: 4.1 mmol/L (ref 3.5–5.1)
Sodium: 141 mmol/L (ref 135–145)
Total Bilirubin: 0.6 mg/dL (ref 0.3–1.2)
Total Protein: 6.7 g/dL (ref 6.5–8.1)

## 2019-02-23 LAB — URINE CULTURE: Culture: NO GROWTH

## 2019-02-23 LAB — TROPONIN I
Troponin I: 0.14 ng/mL (ref <0.03)
Troponin I: 0.14 ng/mL (ref <0.03)
Troponin I: 0.13 ng/mL (ref <0.03)

## 2019-02-23 LAB — ECHOCARDIOGRAM COMPLETE
Height: 63 in
Weight: 1728 oz

## 2019-02-23 MED ORDER — SODIUM CHLORIDE 0.9 % IV SOLN
INTRAVENOUS | Status: AC
  Administered 2019-02-23: 07:00:00 via INTRAVENOUS

## 2019-02-23 MED ORDER — ENOXAPARIN SODIUM 30 MG/0.3ML ~~LOC~~ SOLN
30.0000 mg | Freq: Every day | SUBCUTANEOUS | Status: DC
  Administered 2019-02-23: 30 mg via SUBCUTANEOUS
  Filled 2019-02-23: qty 0.3

## 2019-02-23 MED ORDER — SODIUM CHLORIDE 0.9 % IV SOLN
1.0000 g | Freq: Every day | INTRAVENOUS | Status: DC
  Administered 2019-02-23 (×2): 1 g via INTRAVENOUS
  Filled 2019-02-23 (×3): qty 10

## 2019-02-23 NOTE — Evaluation (Signed)
Physical Therapy Evaluation Patient Details Name: [name] MRN: [id number] DOB: Jun 11, 1931 Today's Date: 02/23/2019   History of Present Illness  85 year old female with a history of hyperlipidemia, Raynaud disease, prior breast cancer who presents with dizziness and high blood pressure. Pt reports having a pressure feeling in her head for last 4 weeks. She was taking her BP regularly and thought the BP monitor was not working, so quit using it. This week felt it was safe to go to pharmacy for replacement. Continued to have elevated BP. Went to [employer] BP was 220/100 and brought to ED on 02/22/19. . CT head and MRI brain does not show anything acute findings. Pt with mildly elevated troponins.   Clinical Impression  PTA pt living independently with husband in single story home with 5 steps to enter, walking 7 miles a day. Pt currently limited in safe mobility by elevated BP with movement (please see measurements below). Pt is independent with bed mobility, and supervision for transfers and ambulation of 20 feet without AD. PT does not anticipate any further PT needs at discharge, however will continue to follow acutely.   Orthostatic BPs                                                            BP                       HR Supine 152/78 60  Sitting 166/82 68  Sitting after 3 min 171/103 75  Sitting after 6 min 138/96 72  Standing 128/87 92  Standing after 3 min 161/92 87  After ambulation 20 feet 142/83 95  Pt reports pressure in top of her head throughout session but does not change with positional change.     Follow Up Recommendations No PT follow up;Supervision - Intermittent    Equipment Recommendations  None recommended by PT       Precautions / Restrictions Precautions Precautions: None Restrictions Weight Bearing Restrictions: Yes      Mobility  Bed Mobility Overal bed mobility: Independent                Transfers Overall transfer level: Needs  assistance Equipment used: None Transfers: Sit to/from Stand Sit to Stand: Supervision         General transfer comment: supervision for safety, required 2 attempts as hips were too far back in bed on first attempt  Ambulation/Gait Ambulation/Gait assistance: Supervision Gait Distance (Feet): 20 Feet Assistive device: None Gait Pattern/deviations: WFL(Within Functional Limits) Gait velocity: slowed  Gait velocity interpretation: 1.31 - 2.62 ft/sec, indicative of limited community ambulator General Gait Details: slow, steady gait       Balance Overall balance assessment: Mild deficits observed, not formally tested                                           Pertinent Vitals/Pain Pain Assessment: No/denies pain    Home Living Family/patient expects to be discharged to:: Private residence Living Arrangements: Spouse/significant other Available Help at Discharge: Family;Available 24 hours/day Type of Home: House Home Access: Stairs to enter Entrance Stairs-Rails: Can reach both Entrance Stairs-Number of Steps: 5 Home  Layout: One level Home Equipment: Cane - single point;Grab bars - tub/shower;Hand held shower head;Shower seat - built in      Prior Function Level of Independence: Independent                  Extremity/Trunk Assessment   Upper Extremity Assessment Upper Extremity Assessment: Overall WFL for tasks assessed    Lower Extremity Assessment Lower Extremity Assessment: Overall WFL for tasks assessed       Communication   Communication: No difficulties  Cognition Arousal/Alertness: Awake/alert Behavior During Therapy: WFL for tasks assessed/performed Overall Cognitive Status: Within Functional Limits for tasks assessed                                               Assessment/Plan    PT Assessment Patient needs continued PT services  PT Problem List Cardiopulmonary status limiting activity       PT  Treatment Interventions DME instruction;Gait training;Stair training;Functional mobility training;Therapeutic activities;Therapeutic exercise;Balance training;Cognitive remediation;Patient/family education    PT Goals (Current goals can be found in the Care Plan section)  Acute Rehab PT Goals Patient Stated Goal: get back to walking PT Goal Formulation: With patient Time For Goal Achievement: 03/09/19 Potential to Achieve Goals: Good    Frequency Min 3X/week    AM-PAC PT "6 Clicks" Mobility  Outcome Measure Help needed turning from your back to your side while in a flat bed without using bedrails?: None Help needed moving from lying on your back to sitting on the side of a flat bed without using bedrails?: None Help needed moving to and from a bed to a chair (including a wheelchair)?: None Help needed standing up from a chair using your arms (e.g., wheelchair or bedside chair)?: None Help needed to walk in hospital room?: None Help needed climbing 3-5 steps with a railing? : A Little 6 Click Score: 23    End of Session   Activity Tolerance: Patient tolerated treatment well Patient left: in chair;with call bell/phone within reach Nurse Communication: Mobility status;Other (comment)(BP measurements) PT Visit Diagnosis: Dizziness and giddiness (R42)    Time: 7564-3329 PT Time Calculation (min) (ACUTE ONLY): 21 min   Charges:   PT Evaluation $PT Eval Moderate Complexity: 1 Mod          Perri Lamagna B. Migdalia Dk PT, DPT Acute Rehabilitation Services Pager [phone] Office [phone]   [place] 02/23/2019, 9:51 AM

## 2019-02-23 NOTE — Telephone Encounter (Signed)
Called her back and LMOM again - no need to CB unless she wishes to. Will plan to follow-up with her mom after hospital discharge. I am following inpt notes

## 2019-02-23 NOTE — Consult Note (Signed)
Cardiology Consultation:   Patient ID: [name] MRN: [id number]; DOB: [birth year]  Admit date: 02/22/2019 Date of Consult: 02/23/2019  Primary Care Provider: Darreld Mclean, MD Primary Cardiologist: [name]) Primary Electrophysiologist:  None    Patient Profile:   [name] is a 85 y.o. female with negative cardiac history and PMH that includes skin cancer, breast cancer diagnosed in 2010 and treated w/ chemo + radiation, cervical cancer in 1972, hyperlipidemia, osteoporosis, brain meningioma and hypothyroidism who is being seen today for the evaluation of recent back pain and elevated troponin, at the request of Dr. Erlinda Mccoy, Internal Medicine.   History of Present Illness:   [name] is a 85 y.o. female with negative cardiac history and PMH that includes skin cancer, breast cancer diagnosed in 2010 and treated w/ chemo + radiation, cervical cancer in 1972, hyperlipidemia, osteoporosis, brain meningioma and hypothyroidism who is being seen today for the evaluation of back pain and elevated troponin, at the request of Dr. Erlinda Mccoy, Internal Medicine. PCP is [name].   Pt presented to the ED last night w/ CC of recent upper back pain and hypertension. Issues with elevated BP have been ongoing for the past 3-4 weeks. She has also experienced new dizziness. She has no prior h/o HTN and not on any BP medications. Given her symptoms and elevated BP, she went to her local fire station and BP there was also elevated and pt transferred to the ED. In the ED, BP was 191/88.  CT of head and MRI of brain did not show anything acute but showed a 7 mm  Meningioma, which is appears to be chronic. Chest CT not pursed. EKG showed sinus bradycardia, 55 bpm. CXR unremarkable. CBC normal. SCr 1.08. Troponin abnormal x 2 but trend is flat, 0.14>>0.14. Pt admitted by IM and cardiology consulted for further w/u.   Pt does not know of any potential triggers for sudden change in BP.  Denies any recent stress. No changes in diet. Denies eating a high salt diet. Drinks ~2 cups of coffee daily. No tobacco use. The only thing new is that she was recently started  Synthroid for hypothyroidism in Feb. Her back discomfort has been located between her shoulder blades. Pain has improved w/ ibuprofen. She has also had recent pain in left jaw.  Also improved w/ ibuprofen. No anterior chest pain. Denies dyspnea. Still feels lightheaded. She is an avid walker, walks anywhere from 5-7 miles a day. She has not had any change in exercise tolerance recently. She has had no exertional CP or dyspnea recently.   I spoke with both the patient, and she then asked me to call her daughter and speak with her as well.   Only changes patient notes is some stress in her life after a fall in March, which resulted in needing stitches in her lip and causes some pain on occasion. Also notes changes as above since starting synthroid. Her main concern about that was severe bilateral leg and arm cramps. She took two spoons of mustard and hasn't had cramps since.  Takes her BP at home, usually 595G-387F systolic, prior at most had been 643P systolic. Doesn't note many lows, though sometime after her walk she has seen a SBP 102-104. No issues with exercise, has been able to walk up to 12 miles/day.   Past Medical History:  Diagnosis Date   Autoimmune disease ([name])    Barrett's esophagus    Bone lesion  sacrum   Breast cancer ([name]) 2010   cT1 N0 M0; ER+/ PR+/ HER2 neg; s/p lumpectomy 07/2010; started adjuvant hormonal therapy 10/2009   Cervical cancer ([name])    Dysphagia    Fracture of left shoulder    Fx distal radius NEC-closed    Glaucoma    Humerus fracture 03/2012   impacted left humueral neck fracture, treated by Dr. Amedeo Plenty   Hyperlipidemia    Hypertension    Neuromuscular disorder ([name])    Osteopenia    Osteoporosis    Osteoporosis 03/18/2012   Personal history of chemotherapy 2010     Left Breast Cancer   Personal history of radiation therapy 2010   Left Breast Cancer   Pneumonia    Thyroid disease     Past Surgical History:  Procedure Laterality Date   ABDOMINAL HYSTERECTOMY  1972   cancer          1 tube 1 ovary   APPENDECTOMY     BREAST LUMPECTOMY Left 2010   colon polyp removal     EYE SURGERY     HAND SURGERY     ortho procedures     multiple   OVARIAN CYST REMOVAL  [place]   left     Home Medications:  Prior to Admission medications   Medication Sig Start Date End Date Taking? Authorizing Provider  Calcium Carbonate-Vitamin D (CALCIUM + D PO) Take 1 capsule by mouth daily. Taking 2 pills per day   Yes [provider]  ibuprofen (ADVIL) 200 MG tablet Take 400 mg by mouth every 6 (six) hours as needed for moderate pain.   Yes [provider]  latanoprost (XALATAN) 0.005 % ophthalmic solution Place 1 drop into both eyes at bedtime.    Yes [provider]  levothyroxine (SYNTHROID, LEVOTHROID) 50 MCG tablet Take 1 tablet (50 mcg total) by mouth daily before breakfast. 08/23/18  Yes Mccoy, Lori Filler, MD  Multiple Vitamin (MULTIVITAMIN) capsule Take 1 capsule by mouth daily.   Yes [provider]  rosuvastatin (CRESTOR) 10 MG tablet TAKE 1 BY MOUTH DAILY 02/21/19  Yes Mccoy, Lori Filler, MD  cephALEXin (KEFLEX) 500 MG capsule Take 1 capsule (500 mg total) by mouth 2 (two) times daily. 02/22/19   Lori Johns, MD  valACYclovir (VALTREX) 1000 MG tablet Take 1 tablet (1,000 mg total) by mouth 3 (three) times daily. Patient not taking: Reported on 02/23/2019 05/13/18   Mccoy, Lori Filler, MD    Inpatient Medications: Scheduled Meds:  aspirin EC  81 mg Oral Daily   enoxaparin (LOVENOX) injection  30 mg Subcutaneous QHS   latanoprost  1 drop Both Eyes QHS   levothyroxine  50 mcg Oral QAC breakfast   rosuvastatin  10 mg Oral q1800   Continuous Infusions:  sodium chloride 75 mL/hr  at 02/23/19 0710   cefTRIAXone (ROCEPHIN)  IV 1 g (02/23/19 0559)   PRN Meds: acetaminophen **OR** acetaminophen, ondansetron **OR** ondansetron (ZOFRAN) IV  Allergies:    Allergies  Allergen Reactions   Daypro [Oxaprozin] Swelling    Face and tongue   Prolia [Denosumab]     Social History:   Social History   Socioeconomic History   Marital status: Married    Spouse name: Not on file   Number of children: 2   Years of education: 12   Highest education level: Not on file  Occupational History   Occupation: retired  Scientist, 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 file   Food insecurity:    Worry: Not on file    Inability: Not on file   Transportation needs:    Medical: Not on file    Non-medical: Not on file  Tobacco Use   Smoking status: Former Smoker    Last attempt to quit: 12/16/1986    Years since quitting: 32.2   Smokeless tobacco: Never Used   Tobacco comment: FORMER SMOKER 23 YEARS AGO  Substance and Sexual Activity   Alcohol use: Yes    Alcohol/week: 0.0 standard drinks    Comment: OCCASIONALLY   Drug use: No   Sexual activity: Not on file  Lifestyle   Physical activity:    Days per week: Not on file    Minutes per session: Not on file   Stress: Not on file  Relationships   Social connections:    Talks on phone: Not on file    Gets together: Not on file    Attends religious service: Not on file    Active member of club or organization: Not on file    Attends meetings of clubs or organizations: Not on file    Relationship status: Not on file   Intimate partner violence:    Fear of current or ex partner: Not on file    Emotionally abused: Not on file    Physically abused: Not on file    Forced sexual activity: Not on file  Other Topics Concern   Not on file  Social History Narrative   Married   Exercise: Yes   Patient lives with husband in a one story home.  Has 2 daughters.  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  Education:  high school.      Family History:    Family History  Problem Relation Age of Onset   Heart disease Father    Cancer Sister        lung, kidney   Cancer Brother        brain   Cancer Paternal Aunt        breast   Breast cancer Paternal Aunt    Cancer Brother        liver, lung   Osteoporosis Neg Hx      ROS:  Please see the history of present illness.  Constitutional: Negative for chills, fever, night sweats, unintentional weight loss  HENT: Negative for ear pain and hearing loss.   Eyes: Negative for loss of vision and eye pain.  Respiratory: Negative for cough, sputum, wheezing.   Cardiovascular: See HPI. Gastrointestinal: Negative for abdominal pain, melena, and hematochezia.  Genitourinary: Negative for dysuria and hematuria.  Musculoskeletal: Negative for myalgias. Positive for fall in March. Positive for prior cramps. Skin: Negative for itching and rash.  Neurological: Negative for focal weakness, focal sensory changes and loss of consciousness. Positive for lightheadededness/head feeling heavy intermittently. Endo/Heme/Allergies: Does not bruise/bleed easily.  All other ROS reviewed and negative.     Physical Exam/Data:   Vitals:   02/22/19 2335 02/23/19 0639 02/23/19 0642 02/23/19 0646  BP: 136/82 (!) 145/63 (!) 157/98 (!) 138/107  Pulse: (!) 52 60 71 89  Resp: 18 18    Temp: 97.8 F (36.6 C) 97.9 F (36.6 C)    TempSrc: Oral Oral    SpO2: 100% 100% 100% 100%  Weight:      Height:        Intake/Output Summary (Last 24 hours) at 02/23/2019 1126 Last data filed at 02/23/2019 0904 Gross per 24  hour  Intake --  Output 200 ml  Net -200 ml   Last 3 Weights 02/22/2019 12/23/2018 12/20/2018  Weight (lbs) 108 lb 112 lb 111 lb  Weight (kg) 48.988 kg 50.803 kg 50.349 kg     Body mass index is 19.13 kg/m.  General:  Well nourished, well developed, in no acute distress HEENT: normal Lymph: no adenopathy Neck: no JVD Endocrine:  No thryomegaly Vascular:  No carotid bruits; RA pulses 2+ bilaterally Cardiac:  normal S1, S2; RRR; 2/6 early SEM heard at USB Lungs:  clear to auscultation bilaterally, no wheezing, rhonchi or rales  Abd: soft, nontender, no hepatomegaly  Ext: no edema Musculoskeletal:  No deformities, BUE and BLE strength normal and equal Skin: warm and dry  Neuro:  CNs 2-12 intact, no focal abnormalities noted Psych:  Normal affect   EKG:  The EKG was personally reviewed and demonstrates:  Sinus bradycardia, 55 bpm Telemetry:  Telemetry was personally reviewed and demonstrates:  Sinus rhythm with rare PVCs.  Relevant CV Studies: None   Laboratory Data:  Chemistry Recent Labs  Lab 02/22/19 1431 02/23/19 0029 02/23/19 0746  NA 140  --  141  K 4.4  --  4.1  CL 103  --  101  CO2 28  --  26  GLUCOSE 89  --  79  BUN 15  --  19  CREATININE 1.08* 1.08* 1.24*  CALCIUM 10.6*  --  9.5  GFRNONAA 47* 47* 40*  GFRAA 54* 54* 46*  ANIONGAP 9  --  14    Recent Labs  Lab 02/23/19 0746  PROT 6.7  ALBUMIN 3.5  AST 37  ALT 21  ALKPHOS 98  BILITOT 0.6   Hematology Recent Labs  Lab 02/22/19 1431 02/23/19 0029 02/23/19 0746  WBC 5.8 7.2 6.7  RBC 4.45 4.53 4.49  HGB 13.2 13.4 13.4  HCT 41.5 41.6 41.6  MCV 93.3 91.8 92.7  MCH 29.7 29.6 29.8  MCHC 31.8 32.2 32.2  RDW 14.9 14.6 14.9  PLT 237 233 237   Cardiac Enzymes Recent Labs  Lab 02/22/19 1949 02/23/19 0029 02/23/19 0746  TROPONINI 0.14* 0.14* 0.14*   No results for input(s): TROPIPOC in the last 168 hours.  BNPNo results for input(s): BNP, PROBNP in the last 168 hours.  DDimer No results for input(s): DDIMER in the last 168 hours.  Radiology/Studies:  Dg Chest 2 View  Result Date: 02/22/2019 CLINICAL DATA:  Hypertension.  Positive troponin today. EXAM: CHEST - 2 VIEW COMPARISON:  Single-view of the chest and two views of the left ribs 12/23/2018. FINDINGS: The lungs are clear. Heart size is upper normal. Aortic atherosclerosis noted. No pneumothorax or  pleural effusion. Surgical clips in the left breast again seen. Lower left rib fracture seen on the comparison study is not visible on this exam. IMPRESSION: No acute disease. Electronically Signed   By: Inge Rise M.D.   On: 02/22/2019 21:34   Ct Head Wo Contrast  Result Date: 02/22/2019 CLINICAL DATA:  Dizziness and vertigo EXAM: CT HEAD WITHOUT CONTRAST TECHNIQUE: Contiguous axial images were obtained from the base of the skull through the vertex without intravenous contrast. COMPARISON:  Head CT August 24, 2014 and brain MRI August 22, 2017 FINDINGS: Brain: Mild diffuse atrophy is stable. There is a stable calcified meningioma arising from the anterior tentorium on the left measuring 1.0 x 0.9 cm. There is no associated edema or mass effect from this small benign meningioma. No other mass evident. There is no  appreciable hemorrhage, extra-axial fluid collection, midline shift. There is patchy small vessel disease in the centra semiovale bilaterally. No acute infarct is demonstrable on this study. Vascular: There is no appreciable hyperdense vessel. There is calcification in each carotid siphon region. Skull: The bony calvarium appears intact. There is a small enostosis in the left frontal region, stable. Sinuses/Orbits: There is patchy opacity in the posterior left ethmoid region. Other visualized paranasal sinuses are clear. Orbits appear symmetric bilaterally. Other: Mastoid air cells are clear. IMPRESSION: Stable atrophy with patchy periventricular small vessel disease. No evident acute infarct. Small calcified meningioma arising from the anterior left tentorium without edema or mass effect. No new mass. No hemorrhage. There are foci of arterial vascular calcification. There is patchy left ethmoid sinus disease posteriorly. Electronically Signed   By: Lowella Grip III M.D.   On: 02/22/2019 15:21   Mr Brain Wo Contrast  Result Date: 02/22/2019 CLINICAL DATA:  Ataxia.  Lightheadedness.   Hypertension. EXAM: MRI HEAD WITHOUT CONTRAST TECHNIQUE: Multiplanar, multiecho pulse sequences of the brain and surrounding structures were obtained without intravenous contrast. COMPARISON:  Head CT 02/22/2019 and MRI 08/22/2017 FINDINGS: Brain: There is no evidence of acute infarct, intracranial hemorrhage, midline shift, or extra-axial fluid collection. There is mild cerebral atrophy. 7 mm calcified lesion along the left tentorium is unchanged from the prior MRI and is without associated brain edema or significant mass effect. Cerebral white matter T2 hyperintensities are unchanged from the prior MRI and nonspecific but compatible with mild chronic small vessel ischemic disease. Vascular: Major intracranial vascular flow voids are preserved. Skull and upper cervical spine: Unremarkable bone marrow signal. Sinuses/Orbits: Bilateral cataract extraction. Small left maxillary sinus mucous retention cyst. Clear mastoid air cells. Other: None. IMPRESSION: 1. No acute intracranial abnormality. 2. Mild chronic small vessel ischemic disease. 3. Unchanged 7 mm calcified meningioma along the left tentorium. Electronically Signed   By: Logan Bores M.D.   On: 02/22/2019 19:01    Assessment and Plan:   ASTELLA DESIR is a 85 y.o. female with negative cardiac history and PMH that includes skin cancer, breast cancer diagnosed in 2010 and treated w/ chemo + radiation, cervical cancer in 1972, hyperlipidemia, osteoporosis, brain meningioma and hypothyroidism who is being seen today for the evaluation of back pain and elevated troponin, at the request of Dr. Erlinda Mccoy, Internal Medicine.   1. Hypertensive emergency: new, uncontrolled HTN, in a pt with no prior h/o high blood pressure and found to have mildly elevated troponin (evidence of end organ involvement). BP in the ED 191/89. Has been symptomatic x 3-4 weeks with intermittent back pain and dizziness. CXR shows no edema. SBP this morning improved, but diastolic BP remains  elevated at 107. Pulse rate from mid 50s-80s. Scr 1.08. K normal at 4.4.  -I personally rechecked her BP after sitting, and it was 99/66. Her trend on her cuff this AM shows SBP from low 140s to 120s. Highly variable diastolic numbers. -I suspect she may have an element of exercise or activity-induced hypertension based on what she is telling me about her pattern.  -I will get an echo, but if her EF is normal (which I expect) I would trial a low dose verapamil for her given the exercise component. Will need to make sure that BP and HR are stable after receiving a dose.  2. Back Pain: based on pt's description, pain sounds most c/w musculoskeletal etiology. It resolved w/ ibuprofen. No recurrence.  EKG shows no ischemia. No widen mediastinum  on CXR. She denies any recent exertional CP or dyspnea. Avid walker, walking 5-7 miles a day for exercise. No recent limitations. No plans for ischemic w/ at this time.   3. Elevated Troponin: mildly elevated but trend is flat at 0.14 x3. EKG shows sinus brady, 55 bpm, but no ischemia. Troponin trend is not c/w ACS, which typically has a rise and fall pattern. Suspect troponin leak is likely demand ischemia in the setting of severely elevated BP. -no further workup as inpatient unless echo markedly abnormal.  4. HLD: on Crestor. Lipids followed by PCP. Last Lipid panel was 02/17/18. LDL was 83 mg/gL.   5. Systolic murmur: suspect she may have mild AS. Checking echo.  For questions or updates, please contact [place] Please consult [email or web address] for contact info under   Signed, Buford Dresser, MD  02/23/2019 11:26 AM

## 2019-02-23 NOTE — Plan of Care (Signed)
  Problem: Education: Goal: Understanding of CV disease, CV risk reduction, and recovery process will improve Outcome: Progressing Goal: Individualized Educational Video(s) Outcome: Progressing

## 2019-02-23 NOTE — Progress Notes (Signed)
PROGRESS NOTE  [name] TDH:[id number] DOB: December 12, 1930 DOA: 02/22/2019 PCP: Darreld Mclean, MD  HPI/Recap of past 24 hours:  Report fluctuating bp recently, feeling intermittent sob  denies chest pain, no edema  report intermittent spasm after started on thyroids meds 59months ago  Assessment/Plan: Active Problems:   Breast cancer ([place])   Raynaud's phenomenon   Hypertensive urgency   Dizziness   Elevated troponin  HTN urgency with dizziness, elevated troponin -no prior history of HTN -MRI brain no acute findings -echo pending -heart healthy diet, cards consulted   AKI on CKDIII Cr baseline 0.98, cr today is 1.24 Renal dosing meds, repeat bmp in am   Possible UTI She is started on rocephin on admission  Mild hypercalcemia - Likely from dehydration, cr normalized after gently hydrate  PTH.  History of Raynaud's phenomenon.  Has been having some cold upper extremities with discoloration off and on as per the daughter.  HLD on statin   Hypothyroidism  History of breast cancer-s/p lumpectomy, radiation, anastrozole (now off).  In remission without disease recurrence. Last saw Dr. Alvy Bimler in 2015  SARS-COV2 test negative  Code Status: full  Family Communication: patient   Disposition Plan: home with cardiology clearance in 24-48hrs  Severity of Illness: The appropriate patient status for this patient is inpatient. inpatient status is judged to be reasonable and necessary in order to provide the required intensity of service to ensure the patient's safety. The patient's presenting symptoms, physical exam findings, and initial radiographic and laboratory data in the context of their medical condition is felt to place them at decreased risk for further clinical deterioration. Furthermore, it is anticipated that the patient will need to received further treatment in the hospital for more than 2 midnights. The following factors support the patient status of  inpatient care  bp control, troponin elevation, aki, hypercalcemia, cardiology consult,   Consultants:  cardiology  Procedures:  none  Antibiotics:  rocephin   Objective: BP (!) 138/107 (BP Location: Right Arm)    Pulse 89    Temp 97.9 F (36.6 C) (Oral)    Resp 18    Ht 5\' 3"  (1.6 m)    Wt 49 kg    SpO2 100%    BMI 19.13 kg/m  No intake or output data in the 24 hours ending 02/23/19 6468 Filed Weights   02/22/19 1312  Weight: 49 kg    Exam: Patient is examined daily including today on 02/23/2019, exams remain the same as of yesterday except that has changed    General:  NAD  Cardiovascular: RRR  Respiratory: CTABL  Abdomen: Soft/ND/NT, positive BS  Musculoskeletal: No Edema  Neuro: alert, oriented   Data Reviewed: Basic Metabolic Panel: Recent Labs  Lab 02/22/19 1431 02/23/19 0029  NA 140  --   K 4.4  --   CL 103  --   CO2 28  --   GLUCOSE 89  --   BUN 15  --   CREATININE 1.08* 1.08*  CALCIUM 10.6*  --    Liver Function Tests: No results for input(s): AST, ALT, ALKPHOS, BILITOT, PROT, ALBUMIN in the last 168 hours. No results for input(s): LIPASE, AMYLASE in the last 168 hours. No results for input(s): AMMONIA in the last 168 hours. CBC: Recent Labs  Lab 02/22/19 1431 02/23/19 0029 02/23/19 0746  WBC 5.8 7.2 6.7  NEUTROABS 3.9  --  4.9  HGB 13.2 13.4 13.4  HCT 41.5 41.6 41.6  MCV 93.3 91.8 92.7  PLT 237 233 237   Cardiac Enzymes:   Recent Labs  Lab 02/22/19 1949 02/23/19 0029  TROPONINI 0.14* 0.14*   BNP (last 3 results) No results for input(s): BNP in the last 8760 hours.  ProBNP (last 3 results) No results for input(s): PROBNP in the last 8760 hours.  CBG: No results for input(s): GLUCAP in the last 168 hours.  Recent Results (from the past 240 hour(s))  SARS Coronavirus 2 (CEPHEID - Performed in [place] hospital lab), Hosp Order     Status: None   Collection Time: 02/22/19  9:49 PM  Result Value Ref Range Status    SARS Coronavirus 2 NEGATIVE NEGATIVE Final    Comment: (NOTE) If result is NEGATIVE SARS-CoV-2 target nucleic acids are NOT DETECTED. The SARS-CoV-2 RNA is generally detectable in upper and lower  respiratory specimens during the acute phase of infection. The lowest  concentration of SARS-CoV-2 viral copies this assay can detect is 250  copies / mL. A negative result does not preclude SARS-CoV-2 infection  and should not be used as the sole basis for treatment or other  patient management decisions.  A negative result may occur with  improper specimen collection / handling, submission of specimen other  than nasopharyngeal swab, presence of viral mutation(s) within the  areas targeted by this assay, and inadequate number of viral copies  (<250 copies / mL). A negative result must be combined with clinical  observations, patient history, and epidemiological information. If result is POSITIVE SARS-CoV-2 target nucleic acids are DETECTED. The SARS-CoV-2 RNA is generally detectable in upper and lower  respiratory specimens dur ing the acute phase of infection.  Positive  results are indicative of active infection with SARS-CoV-2.  Clinical  correlation with patient history and other diagnostic information is  necessary to determine patient infection status.  Positive results do  not rule out bacterial infection or co-infection with other viruses. If result is PRESUMPTIVE POSTIVE SARS-CoV-2 nucleic acids MAY BE PRESENT.   A presumptive positive result was obtained on the submitted specimen  and confirmed on repeat testing.  While 2019 novel coronavirus  (SARS-CoV-2) nucleic acids may be present in the submitted sample  additional confirmatory testing may be necessary for epidemiological  and / or clinical management purposes  to differentiate between  SARS-CoV-2 and other Sarbecovirus currently known to infect humans.  If clinically indicated additional testing with an alternate test    methodology [phone]) is advised. The SARS-CoV-2 RNA is generally  detectable in upper and lower respiratory sp ecimens during the acute  phase of infection. The expected result is Negative. Fact Sheet for Patients:  StrictlyIdeas.no Fact Sheet for Healthcare Providers: BankingDealers.co.za This test is not yet approved or cleared by the [place] FDA and has been authorized for detection and/or diagnosis of SARS-CoV-2 by FDA under an Emergency Use Authorization (EUA).  This EUA will remain in effect (meaning this test can be used) for the duration of the COVID-19 declaration under Section 564(b)(1) of the Act, 21 U.S.C. section 360bbb-3(b)(1), unless the authorization is terminated or revoked sooner. Performed at [facility], [place] [address], [place], [place] [address]      Studies: Dg Chest 2 View  Result Date: 02/22/2019 CLINICAL DATA:  Hypertension.  Positive troponin today. EXAM: CHEST - 2 VIEW COMPARISON:  Single-view of the chest and two views of the left ribs 12/23/2018. FINDINGS: The lungs are clear. Heart size is upper normal. Aortic atherosclerosis noted. No pneumothorax or pleural effusion.  Surgical clips in the left breast again seen. Lower left rib fracture seen on the comparison study is not visible on this exam. IMPRESSION: No acute disease. Electronically Signed   By: Inge Rise M.D.   On: 02/22/2019 21:34   Ct Head Wo Contrast  Result Date: 02/22/2019 CLINICAL DATA:  Dizziness and vertigo EXAM: CT HEAD WITHOUT CONTRAST TECHNIQUE: Contiguous axial images were obtained from the base of the skull through the vertex without intravenous contrast. COMPARISON:  Head CT August 24, 2014 and brain MRI August 22, 2017 FINDINGS: Brain: Mild diffuse atrophy is stable. There is a stable calcified meningioma arising from the anterior tentorium on the left measuring 1.0 x 0.9 cm. There is no associated edema or mass  effect from this small benign meningioma. No other mass evident. There is no appreciable hemorrhage, extra-axial fluid collection, midline shift. There is patchy small vessel disease in the centra semiovale bilaterally. No acute infarct is demonstrable on this study. Vascular: There is no appreciable hyperdense vessel. There is calcification in each carotid siphon region. Skull: The bony calvarium appears intact. There is a small enostosis in the left frontal region, stable. Sinuses/Orbits: There is patchy opacity in the posterior left ethmoid region. Other visualized paranasal sinuses are clear. Orbits appear symmetric bilaterally. Other: Mastoid air cells are clear. IMPRESSION: Stable atrophy with patchy periventricular small vessel disease. No evident acute infarct. Small calcified meningioma arising from the anterior left tentorium without edema or mass effect. No new mass. No hemorrhage. There are foci of arterial vascular calcification. There is patchy left ethmoid sinus disease posteriorly. Electronically Signed   By: Lowella Grip III M.D.   On: 02/22/2019 15:21   Mr Brain Wo Contrast  Result Date: 02/22/2019 CLINICAL DATA:  Ataxia.  Lightheadedness.  Hypertension. EXAM: MRI HEAD WITHOUT CONTRAST TECHNIQUE: Multiplanar, multiecho pulse sequences of the brain and surrounding structures were obtained without intravenous contrast. COMPARISON:  Head CT 02/22/2019 and MRI 08/22/2017 FINDINGS: Brain: There is no evidence of acute infarct, intracranial hemorrhage, midline shift, or extra-axial fluid collection. There is mild cerebral atrophy. 7 mm calcified lesion along the left tentorium is unchanged from the prior MRI and is without associated brain edema or significant mass effect. Cerebral white matter T2 hyperintensities are unchanged from the prior MRI and nonspecific but compatible with mild chronic small vessel ischemic disease. Vascular: Major intracranial vascular flow voids are preserved. Skull  and upper cervical spine: Unremarkable bone marrow signal. Sinuses/Orbits: Bilateral cataract extraction. Small left maxillary sinus mucous retention cyst. Clear mastoid air cells. Other: None. IMPRESSION: 1. No acute intracranial abnormality. 2. Mild chronic small vessel ischemic disease. 3. Unchanged 7 mm calcified meningioma along the left tentorium. Electronically Signed   By: Logan Bores M.D.   On: 02/22/2019 19:01    Scheduled Meds:  aspirin EC  81 mg Oral Daily   enoxaparin (LOVENOX) injection  30 mg Subcutaneous QHS   latanoprost  1 drop Both Eyes QHS   levothyroxine  50 mcg Oral QAC breakfast   rosuvastatin  10 mg Oral q1800    Continuous Infusions:  sodium chloride 75 mL/hr at 02/23/19 0710   cefTRIAXone (ROCEPHIN)  IV 1 g (02/23/19 0559)     Time spent: 13mins I have personally reviewed and interpreted on  02/23/2019 daily labs, tele strips, imagings as discussed above under date review session and assessment and plans.  I reviewed all nursing notes, pharmacy notes, consultant notes,  vitals, pertinent old records  I have discussed plan of care  as described above with RN , patient on 02/23/2019   Florencia Reasons MD, PhD  [facility] Pager [phone]. If 7PM-7AM, please contact night-coverage at [email or web address], password [facility] 02/23/2019, 8:21 AM  LOS: 0 days

## 2019-02-24 ENCOUNTER — Inpatient Hospital Stay (HOSPITAL_COMMUNITY): Payer: Medicare Other

## 2019-02-24 DIAGNOSIS — N1831 Chronic kidney disease, stage 3a: Secondary | ICD-10-CM

## 2019-02-24 DIAGNOSIS — I700 Atherosclerosis of aorta: Secondary | ICD-10-CM

## 2019-02-24 DIAGNOSIS — N182 Chronic kidney disease, stage 2 (mild): Secondary | ICD-10-CM

## 2019-02-24 LAB — BASIC METABOLIC PANEL
Anion gap: 10 (ref 5–15)
BUN: 18 mg/dL (ref 8–23)
CO2: 25 mmol/L (ref 22–32)
Calcium: 9 mg/dL (ref 8.9–10.3)
Chloride: 106 mmol/L (ref 98–111)
Creatinine, Ser: 1.25 mg/dL — ABNORMAL HIGH (ref 0.44–1.00)
GFR calc Af Amer: 45 mL/min — ABNORMAL LOW (ref >60)
GFR calc non Af Amer: 39 mL/min — ABNORMAL LOW (ref >60)
Glucose, Bld: 93 mg/dL (ref 70–99)
Potassium: 4.1 mmol/L (ref 3.5–5.1)
Sodium: 141 mmol/L (ref 135–145)

## 2019-02-24 LAB — MAGNESIUM: Magnesium: 1.8 mg/dL (ref 1.7–2.4)

## 2019-02-24 LAB — PTH, INTACT AND CALCIUM
Calcium, Total (PTH): 9.2 mg/dL (ref 8.7–10.3)
PTH: 18 pg/mL (ref 15–65)

## 2019-02-24 LAB — VITAMIN D 25 HYDROXY (VIT D DEFICIENCY, FRACTURES): Vit D, 25-Hydroxy: 74.1 ng/mL (ref 30.0–100.0)

## 2019-02-24 LAB — TSH: TSH: 2.191 u[IU]/mL (ref 0.350–4.500)

## 2019-02-24 MED ORDER — SENNOSIDES-DOCUSATE SODIUM 8.6-50 MG PO TABS: 1.0000 | ORAL_TABLET | Freq: Two times a day (BID) | ORAL | Status: DC

## 2019-02-24 MED ORDER — SENNOSIDES-DOCUSATE SODIUM 8.6-50 MG PO TABS: 1.0000 | ORAL_TABLET | Freq: Every day | ORAL | 0 refills | Status: DC

## 2019-02-24 MED ORDER — VERAPAMIL HCL ER 120 MG PO TBCR: 120.0000 mg | EXTENDED_RELEASE_TABLET | Freq: Every day | ORAL | 0 refills | Status: DC

## 2019-02-24 MED ORDER — ACETAMINOPHEN 325 MG PO TABS: 650.0000 mg | ORAL_TABLET | Freq: Four times a day (QID) | ORAL | 0 refills | Status: DC | PRN

## 2019-02-24 MED ORDER — VERAPAMIL HCL ER 120 MG PO TBCR
120.0000 mg | EXTENDED_RELEASE_TABLET | Freq: Every day | ORAL | Status: DC
  Administered 2019-02-24: 120 mg via ORAL
  Filled 2019-02-24: qty 1

## 2019-02-24 NOTE — Progress Notes (Addendum)
   02/24/19 1113 02/24/19 1125  Vitals  BP (!) 145/69 (Pre Ambulation) (!) 154/74 (Post Ambulation)  MAP (mmHg) 87 97  BP Location Left Arm Left Arm  BP Method Automatic Automatic  Patient Position (if appropriate) Lying Sitting  Patient ambulated in hall. No distress noted, no complaints voiced. BP pre and post ambulation above.

## 2019-02-24 NOTE — Progress Notes (Signed)
Progress Note  Patient Name: [name] Date of Encounter: 02/24/2019  Primary Cardiologist: Buford Dresser, MD   Subjective   Doing well this AM. No complaints. Reviewed the results of the echo, my thoughts re: trialing verapamil. She asked me to call her daughter [name] as well, spoke to her on the phone in the patient's room, relayed information. Both patient's and her daughter's questions answered.  Inpatient Medications    Scheduled Meds:  aspirin EC  81 mg Oral Daily   enoxaparin (LOVENOX) injection  30 mg Subcutaneous QHS   latanoprost  1 drop Both Eyes QHS   levothyroxine  50 mcg Oral QAC breakfast   rosuvastatin  10 mg Oral q1800   Continuous Infusions:  cefTRIAXone (ROCEPHIN)  IV Stopped (02/23/19 2230)   PRN Meds: acetaminophen **OR** acetaminophen, ondansetron **OR** ondansetron (ZOFRAN) IV   Vital Signs    Vitals:   02/23/19 1358 02/23/19 1746 02/23/19 2122 02/24/19 0420  BP: (!) 125/56 128/84 (!) 147/71 124/62  Pulse: 60  86 78  Resp:   20 20  Temp: 97.6 F (36.4 C)  97.6 F (36.4 C) 97.7 F (36.5 C)  TempSrc: Oral  Oral Oral  SpO2: 100%  98% 99%  Weight:    49.4 kg  Height:        Intake/Output Summary (Last 24 hours) at 02/24/2019 0827 Last data filed at 02/24/2019 0430 Gross per 24 hour  Intake 2052.47 ml  Output 1900 ml  Net 152.47 ml   Last 3 Weights 02/24/2019 02/22/2019 12/23/2018  Weight (lbs) 108 lb 14.5 oz 108 lb 112 lb  Weight (kg) 49.4 kg 48.988 kg 50.803 kg      Telemetry    NSR - Personally Reviewed  ECG    No new since 5/12 - Personally Reviewed  Physical Exam   GEN: No acute distress.   Neck: No JVD Cardiac: RRR, no rubs, or gallops. 2/6 SEM early peaking at USB Respiratory: Clear to auscultation bilaterally. GI: Soft, nontender, non-distended  MS: No edema; No deformity. Neuro:  Nonfocal  Psych: Normal affect   Labs    Chemistry Recent Labs  Lab 02/22/19 1431 02/23/19 0029 02/23/19 0746  02/24/19 0401  NA 140  --  141 141  K 4.4  --  4.1 4.1  CL 103  --  101 106  CO2 28  --  26 25  GLUCOSE 89  --  79 93  BUN 15  --  19 18  CREATININE 1.08* 1.08* 1.24* 1.25*  CALCIUM 10.6*  --  9.5   9.2 9.0  PROT  --   --  6.7  --   ALBUMIN  --   --  3.5  --   AST  --   --  37  --   ALT  --   --  21  --   ALKPHOS  --   --  98  --   BILITOT  --   --  0.6  --   GFRNONAA 47* 47* 40* 39*  GFRAA 54* 54* 46* 45*  ANIONGAP 9  --  14 10     Hematology Recent Labs  Lab 02/22/19 1431 02/23/19 0029 02/23/19 0746  WBC 5.8 7.2 6.7  RBC 4.45 4.53 4.49  HGB 13.2 13.4 13.4  HCT 41.5 41.6 41.6  MCV 93.3 91.8 92.7  MCH 29.7 29.6 29.8  MCHC 31.8 32.2 32.2  RDW 14.9 14.6 14.9  PLT 237 233 237    Cardiac Enzymes  Recent Labs  Lab 02/22/19 1949 02/23/19 0029 02/23/19 0746 02/23/19 1402  TROPONINI 0.14* 0.14* 0.14* 0.13*   No results for input(s): TROPIPOC in the last 168 hours.   BNPNo results for input(s): BNP, PROBNP in the last 168 hours.   DDimer No results for input(s): DDIMER in the last 168 hours.   Radiology    Dg Chest 2 View  Result Date: 02/22/2019 CLINICAL DATA:  Hypertension.  Positive troponin today. EXAM: CHEST - 2 VIEW COMPARISON:  Single-view of the chest and two views of the left ribs 12/23/2018. FINDINGS: The lungs are clear. Heart size is upper normal. Aortic atherosclerosis noted. No pneumothorax or pleural effusion. Surgical clips in the left breast again seen. Lower left rib fracture seen on the comparison study is not visible on this exam. IMPRESSION: No acute disease. Electronically Signed   By: Inge Rise M.D.   On: 02/22/2019 21:34   Ct Head Wo Contrast  Result Date: 02/22/2019 CLINICAL DATA:  Dizziness and vertigo EXAM: CT HEAD WITHOUT CONTRAST TECHNIQUE: Contiguous axial images were obtained from the base of the skull through the vertex without intravenous contrast. COMPARISON:  Head CT August 24, 2014 and brain MRI August 22, 2017 FINDINGS:  Brain: Mild diffuse atrophy is stable. There is a stable calcified meningioma arising from the anterior tentorium on the left measuring 1.0 x 0.9 cm. There is no associated edema or mass effect from this small benign meningioma. No other mass evident. There is no appreciable hemorrhage, extra-axial fluid collection, midline shift. There is patchy small vessel disease in the centra semiovale bilaterally. No acute infarct is demonstrable on this study. Vascular: There is no appreciable hyperdense vessel. There is calcification in each carotid siphon region. Skull: The bony calvarium appears intact. There is a small enostosis in the left frontal region, stable. Sinuses/Orbits: There is patchy opacity in the posterior left ethmoid region. Other visualized paranasal sinuses are clear. Orbits appear symmetric bilaterally. Other: Mastoid air cells are clear. IMPRESSION: Stable atrophy with patchy periventricular small vessel disease. No evident acute infarct. Small calcified meningioma arising from the anterior left tentorium without edema or mass effect. No new mass. No hemorrhage. There are foci of arterial vascular calcification. There is patchy left ethmoid sinus disease posteriorly. Electronically Signed   By: Lowella Grip III M.D.   On: 02/22/2019 15:21   Mr Brain Wo Contrast  Result Date: 02/22/2019 CLINICAL DATA:  Ataxia.  Lightheadedness.  Hypertension. EXAM: MRI HEAD WITHOUT CONTRAST TECHNIQUE: Multiplanar, multiecho pulse sequences of the brain and surrounding structures were obtained without intravenous contrast. COMPARISON:  Head CT 02/22/2019 and MRI 08/22/2017 FINDINGS: Brain: There is no evidence of acute infarct, intracranial hemorrhage, midline shift, or extra-axial fluid collection. There is mild cerebral atrophy. 7 mm calcified lesion along the left tentorium is unchanged from the prior MRI and is without associated brain edema or significant mass effect. Cerebral white matter T2  hyperintensities are unchanged from the prior MRI and nonspecific but compatible with mild chronic small vessel ischemic disease. Vascular: Major intracranial vascular flow voids are preserved. Skull and upper cervical spine: Unremarkable bone marrow signal. Sinuses/Orbits: Bilateral cataract extraction. Small left maxillary sinus mucous retention cyst. Clear mastoid air cells. Other: None. IMPRESSION: 1. No acute intracranial abnormality. 2. Mild chronic small vessel ischemic disease. 3. Unchanged 7 mm calcified meningioma along the left tentorium. Electronically Signed   By: Logan Bores M.D.   On: 02/22/2019 19:01    Cardiac Studies   Echo 02/23/19  1.  The left ventricle has normal systolic function with an ejection fraction of 60-65%. The cavity size was normal. There is mildly increased left ventricular wall thickness. Left ventricular diastolic Doppler parameters are consistent with impaired  relaxation. No evidence of left ventricular regional wall motion abnormalities.  2. The right ventricle has normal systolic function. The cavity was normal. There is no increase in right ventricular wall thickness.  3. Trivial pericardial effusion is present.  4. No evidence of mitral valve stenosis. No significant mitral regurgitation.  5. The aortic valve is tricuspid. Mild calcification of the aortic valve. No stenosis of the aortic valve.  6. The aortic root is normal in size and structure.  7. Normal IVC size with PA systolic pressure 28 mmHg.  Patient Profile     85 y.o. female without prior cardiac PMH but history of breast cancer, skin cancer, cervical cancer, hypothyroidism, hyperlipidemia, osteoporosis, meningioma presenting with hypertensive emergency (given positive troponin).   Assessment & Plan    Hypertensive emergency: new, uncontrolled HTN, in a pt with no prior h/o high blood pressure and found to have mildly elevated troponin (evidence of end organ involvement). BP in the ED 191/89.  Has been symptomatic x 3-4 weeks with intermittent back pain and dizziness. Unclear if discomfort is driving her elevated BP or vice versa. -difficult situation. She has only mild LVH and mild diastolic dysfunction, with an otherwise unremarkable echo. Her resting BP are normal, and yesterday AM for me had a BP that was low (but asymptomatic). Do not want to make her hypotensive at rest, but she does significantly elevate her BP with activity, pain, etc. -I will trial a low dose verapamil for her given the exercise component. Will need to make sure that BP and HR are stable after receiving a dose. Will trial this AM. 1-2 hours after the dose, would have her walk, measuring BP once she sits down for several minutes after walking. -we reviewed that it is normal for her to have a mildly elevated BP after exercise, but we want to make sure that it is not spiking near 200 SBP.  HLD: on Crestor. Lipids followed by PCP. Last Lipid panel was 02/17/18. LDL was 83 mg/gL. Given age, no indication for aspirin. Will stop today.  Systolic murmur: echo shows aortic sclerosis without stenosis  [facility] will sign off.   Medication Recommendations:  Adding veramapil 120 mg long acting, otherwise continue home meds. Other recommendations (labs, testing, etc):  none Follow up as an outpatient:  We will arrange for follow up virtual visit in several weeks.  TIME SPENT WITH PATIENT: >35 minutes of direct patient care. More than 50% of that time was spent on coordination of care and counseling regarding echo results, physiology, plans for medication trial  Buford Dresser, MD, PhD [facility]   [facility]   For questions or updates, please contact [place] Please consult [email or web address] for contact info under     Signed, Buford Dresser, MD  02/24/2019, 8:27 AM

## 2019-02-24 NOTE — Discharge Summary (Signed)
Discharge Summary  [name]:[id number] DOB: May 08, 1931  PCP: Darreld Mclean, MD  Admit date: 02/22/2019 Discharge date: 02/24/2019  Time spent: 73mins, case discussed with cardiology Dr Harrell Gave prior to discharge.  Recommendations for Outpatient Follow-up:  1. F/u with PCP within a week  for [facility] discharge follow up, repeat cbc/bmp at follow up 2. F/u with cardiology  Discharge Diagnoses:  Active [facility] Problems   Diagnosis Date Noted   Systolic murmur 28/31/5176   Hypertensive urgency 02/23/2019   Hypercalcemia    AKI (acute kidney injury) [facility])    Hypertensive emergency without congestive heart failure 02/22/2019   Dizziness 02/22/2019   Elevated troponin 02/22/2019   Raynaud's phenomenon 02/28/2016   Breast cancer [name])     Resolved [facility] Problems  No resolved problems to display.    Discharge Condition: stable  Diet recommendation: heart healthy  Filed Weights   02/22/19 1312 02/24/19 0420  Weight: 49 kg 49.4 kg    History of present illness: (per admitting MD Dr Hal Hope) PCP: Darreld Mclean, MD  Patient coming from: Home.  Chief Complaint: Dizziness.  Elevated blood pressure.  HPI: [name] is a 85 y.o. female with history of hyperlipidemia and hypothyroidism who walks daily about 7 miles started feeling dizzy since this morning.  Patient states 3 to 4 days ago patient had some upper back pain and has been monitoring her blood pressure which has been increasing last 2 days.  Concerned with that patient had gone to the fire station where patient has blood pressure was found to be elevated and was transferred to the ER.  Patient also was finding it difficult to walk because of dizziness.  Denies any headache or visual symptoms.  Denies any weakness of the upper or lower extremity.  Lower back pain which patient had 3 to 4 days ago resolved after patient took ibuprofen.  ED Course: In the ER patient blood pressure  191/89 which improved without any intervention.  UA showing features concerning for UTI.  CT head and MRI brain does not show anything acute but shows a 7 mm.  Meningioma which is appears to be chronic.  EKG shows sinus bradycardia heart rate around 55 bpm.  Troponin turned out to be mildly positive at 1.14.  Cardiology was consulted by ER physician who recommended trending troponins and admitting for observation.  In addition patient's labs show mildly elevated calcium of 10.6 creatinine is 1.08.  [facility] Course:  Active Problems:   Breast cancer ([facility])   Raynaud's phenomenon   Hypertensive emergency without congestive heart failure   Dizziness   Elevated troponin   Systolic murmur   Hypertensive urgency   Hypercalcemia   AKI (acute kidney injury) ([name])   HTN urgency with dizziness, elevated troponin -no prior history of HTN -MRI brain no acute findings -echo LVEF wnl, does has mild LVH and mild diastolic dysfunction -heart healthy diet, cards consulted who recommended start verapamil , she will follow up with cardiology in several weeks, cardiology will arrange follow up   AKI on CKDIII vs progressive ckd from uncontrolled HTN Cr baseline 0.98, cr today is 1.24 Urine culture no growth, she denies urinary symptom Renal US does show medical renal disease, no obstruction Renal dosing meds D/c ibuprofen  pcp to monitor renal function   Mild hypercalcemia- Likely from dehydration, cr normalized after gently hydrate  PTH level and 25oh vit d level unremarkable  History of Raynaud's phenomenon. Has been having some cold upper extremities with  discoloration off and on as per the daughter.  HLD on statin   Hypothyroidism, stable on synthroid  History of breast cancer-s/p lumpectomy, radiation, anastrozole (now off).  In remission without disease recurrence. Last saw Dr. Alvy Bimler in 2015  SARS-COV2 test negative  Code Status: full  Family Communication: patient    Disposition Plan: home with cardiology clearance    Consultants:  cardiology  Procedures:  none  Antibiotics:  rocephin   Discharge Exam: BP 127/77 (BP Location: Right Arm)    Pulse 69    Temp 97.6 F (36.4 C) (Oral)    Resp 18    Ht 5\' 3"  (1.6 m)    Wt 49.4 kg    SpO2 100%    BMI 19.29 kg/m    General:  NAD  Cardiovascular: RRR  Respiratory: CTABL  Abdomen: Soft/ND/NT, positive BS  Musculoskeletal: No Edema  Neuro: alert, oriented    Discharge Instructions You were cared for by a hospitalist during your [facility] stay. If you have any questions about your discharge medications or the care you received while you were in the [facility] after you are discharged, you can call the unit and asked to speak with the hospitalist on call if the hospitalist that took care of you is not available. Once you are discharged, your primary care physician will handle any further medical issues. Please note that NO REFILLS for any discharge medications will be authorized once you are discharged, as it is imperative that you return to your primary care physician (or establish a relationship with a primary care physician if you do not have one) for your aftercare needs so that they can reassess your need for medications and monitor your lab values.  Discharge Instructions    Diet - low sodium heart healthy   Complete by:  As directed    Increase activity slowly   Complete by:  As directed      Allergies as of 02/24/2019      Reactions   Daypro [oxaprozin] Swelling   Face and tongue   Prolia [denosumab]       Medication List    STOP taking these medications   ibuprofen 200 MG tablet Commonly known as:  ADVIL   valACYclovir 1000 MG tablet Commonly known as:  Valtrex     TAKE these medications   acetaminophen 325 MG tablet Commonly known as:  TYLENOL Take 2 tablets (650 mg total) by mouth every 6 (six) hours as needed for mild pain (or Fever >/= 101).   CALCIUM + D  PO Take 1 capsule by mouth daily. Taking 2 pills per day   cephALEXin 500 MG capsule Commonly known as:  KEFLEX Take 1 capsule (500 mg total) by mouth 2 (two) times daily. What changed:  how much to take   latanoprost 0.005 % ophthalmic solution Commonly known as:  XALATAN Place 1 drop into both eyes at bedtime.   levothyroxine 50 MCG tablet Commonly known as:  SYNTHROID Take 1 tablet (50 mcg total) by mouth daily before breakfast.   multivitamin capsule Take 1 capsule by mouth daily.   rosuvastatin 10 MG tablet Commonly known as:  Crestor TAKE 1 BY MOUTH DAILY What changed:    how much to take  how to take this  when to take this   senna-docusate 8.6-50 MG tablet Commonly known as:  Senokot-S Take 1 tablet by mouth at bedtime.   verapamil 120 MG CR tablet Commonly known as:  CALAN-SR Take 1 tablet (120  mg total) by mouth daily. Start taking on:  Feb 25, 2019      Allergies  Allergen Reactions   Daypro [Oxaprozin] Swelling    Face and tongue   Prolia [Denosumab]    Follow-up Information    Schedule an appointment as soon as possible for a visit with Copland, Gay Filler, MD.   Specialty:  Family Medicine Why:  [facility] discharge follow up, repeat bmp  (kidney function) at follow up  Contact information: [address] [place] [address] [phone]        Buford Dresser, MD Follow up in 3 week(s).   Specialty:  Cardiology Contact information: [address] [place] [address] [phone]            The results of significant diagnostics from this hospitalization (including imaging, microbiology, ancillary and laboratory) are listed below for reference.    Significant Diagnostic Studies: Dg Chest 2 View  Result Date: 02/22/2019 CLINICAL DATA:  Hypertension.  Positive troponin today. EXAM: CHEST - 2 VIEW COMPARISON:  Single-view of the chest and two views of the left ribs 12/23/2018. FINDINGS: The  lungs are clear. Heart size is upper normal. Aortic atherosclerosis noted. No pneumothorax or pleural effusion. Surgical clips in the left breast again seen. Lower left rib fracture seen on the comparison study is not visible on this exam. IMPRESSION: No acute disease. Electronically Signed   By: Inge Rise M.D.   On: 02/22/2019 21:34   Ct Head Wo Contrast  Result Date: 02/22/2019 CLINICAL DATA:  Dizziness and vertigo EXAM: CT HEAD WITHOUT CONTRAST TECHNIQUE: Contiguous axial images were obtained from the base of the skull through the vertex without intravenous contrast. COMPARISON:  Head CT August 24, 2014 and brain MRI August 22, 2017 FINDINGS: Brain: Mild diffuse atrophy is stable. There is a stable calcified meningioma arising from the anterior tentorium on the left measuring 1.0 x 0.9 cm. There is no associated edema or mass effect from this small benign meningioma. No other mass evident. There is no appreciable hemorrhage, extra-axial fluid collection, midline shift. There is patchy small vessel disease in the centra semiovale bilaterally. No acute infarct is demonstrable on this study. Vascular: There is no appreciable hyperdense vessel. There is calcification in each carotid siphon region. Skull: The bony calvarium appears intact. There is a small enostosis in the left frontal region, stable. Sinuses/Orbits: There is patchy opacity in the posterior left ethmoid region. Other visualized paranasal sinuses are clear. Orbits appear symmetric bilaterally. Other: Mastoid air cells are clear. IMPRESSION: Stable atrophy with patchy periventricular small vessel disease. No evident acute infarct. Small calcified meningioma arising from the anterior left tentorium without edema or mass effect. No new mass. No hemorrhage. There are foci of arterial vascular calcification. There is patchy left ethmoid sinus disease posteriorly. Electronically Signed   By: Lowella Grip III M.D.   On: 02/22/2019 15:21    Mr Brain Wo Contrast  Result Date: 02/22/2019 CLINICAL DATA:  Ataxia.  Lightheadedness.  Hypertension. EXAM: MRI HEAD WITHOUT CONTRAST TECHNIQUE: Multiplanar, multiecho pulse sequences of the brain and surrounding structures were obtained without intravenous contrast. COMPARISON:  Head CT 02/22/2019 and MRI 08/22/2017 FINDINGS: Brain: There is no evidence of acute infarct, intracranial hemorrhage, midline shift, or extra-axial fluid collection. There is mild cerebral atrophy. 7 mm calcified lesion along the left tentorium is unchanged from the prior MRI and is without associated brain edema or significant mass effect. Cerebral white matter T2 hyperintensities are  unchanged from the prior MRI and nonspecific but compatible with mild chronic small vessel ischemic disease. Vascular: Major intracranial vascular flow voids are preserved. Skull and upper cervical spine: Unremarkable bone marrow signal. Sinuses/Orbits: Bilateral cataract extraction. Small left maxillary sinus mucous retention cyst. Clear mastoid air cells. Other: None. IMPRESSION: 1. No acute intracranial abnormality. 2. Mild chronic small vessel ischemic disease. 3. Unchanged 7 mm calcified meningioma along the left tentorium. Electronically Signed   By: Logan Bores M.D.   On: 02/22/2019 19:01   US Renal  Result Date: 02/24/2019 CLINICAL DATA:  Creatinine elevation. EXAM: RENAL / URINARY TRACT ULTRASOUND COMPLETE COMPARISON:  CT abdomen pelvis dated September 08, 2017. FINDINGS: Right Kidney: Renal measurements: 9.0 x 4.1 x 4.7 cm = volume: 90 mL. Increased echogenicity. 8 mm simple cyst in the upper pole. No mass or hydronephrosis visualized. Left Kidney: Renal measurements: 9.4 x 4.7 x 4.4 cm = volume: 101 mL. Increased echogenicity. 9 mm simple cyst in the lower pole. No mass or hydronephrosis visualized. Bladder: Appears normal for degree of bladder distention. IMPRESSION: 1. Bilateral increased renal echogenicity, consistent with medical  renal disease. No acute abnormality. Electronically Signed   By: Titus Dubin M.D.   On: 02/24/2019 10:39    Microbiology: Recent Results (from the past 240 hour(s))  Urine culture     Status: None   Collection Time: 02/22/19  6:38 PM  Result Value Ref Range Status   Specimen Description URINE, CLEAN CATCH  Final   Special Requests NONE  Final   Culture   Final    NO GROWTH Performed at [facility], [address], [place], [place] [address]    Report Status 02/23/2019 FINAL  Final  SARS Coronavirus 2 (CEPHEID - Performed in [facility]), Hosp Order     Status: None   Collection Time: 02/22/19  9:49 PM  Result Value Ref Range Status   SARS Coronavirus 2 NEGATIVE NEGATIVE Final    Comment: (NOTE) If result is NEGATIVE SARS-CoV-2 target nucleic acids are NOT DETECTED. The SARS-CoV-2 RNA is generally detectable in upper and lower  respiratory specimens during the acute phase of infection. The lowest  concentration of SARS-CoV-2 viral copies this assay can detect is 250  copies / mL. A negative result does not preclude SARS-CoV-2 infection  and should not be used as the sole basis for treatment or other  patient management decisions.  A negative result may occur with  improper specimen collection / handling, submission of specimen other  than nasopharyngeal swab, presence of viral mutation(s) within the  areas targeted by this assay, and inadequate number of viral copies  (<250 copies / mL). A negative result must be combined with clinical  observations, patient history, and epidemiological information. If result is POSITIVE SARS-CoV-2 target nucleic acids are DETECTED. The SARS-CoV-2 RNA is generally detectable in upper and lower  respiratory specimens dur ing the acute phase of infection.  Positive  results are indicative of active infection with SARS-CoV-2.  Clinical  correlation with patient history and other diagnostic information is  necessary to  determine patient infection status.  Positive results do  not rule out bacterial infection or co-infection with other viruses. If result is PRESUMPTIVE POSTIVE SARS-CoV-2 nucleic acids MAY BE PRESENT.   A presumptive positive result was obtained on the submitted specimen  and confirmed on repeat testing.  While 2019 novel coronavirus  (SARS-CoV-2) nucleic acids may be present in the submitted sample  additional confirmatory testing  may be necessary for epidemiological  and / or clinical management purposes  to differentiate between  SARS-CoV-2 and other Sarbecovirus currently known to infect humans.  If clinically indicated additional testing with an alternate test  methodology [phone]) is advised. The SARS-CoV-2 RNA is generally  detectable in upper and lower respiratory sp ecimens during the acute  phase of infection. The expected result is Negative. Fact Sheet for Patients:  StrictlyIdeas.no Fact Sheet for Healthcare Providers: BankingDealers.co.za This test is not yet approved or cleared by the [place] FDA and has been authorized for detection and/or diagnosis of SARS-CoV-2 by FDA under an Emergency Use Authorization (EUA).  This EUA will remain in effect (meaning this test can be used) for the duration of the COVID-19 declaration under Section 564(b)(1) of the Act, 21 U.S.C. section 360bbb-3(b)(1), unless the authorization is terminated or revoked sooner. Performed at [facility], [place] [address], [place], [place] [address]      Labs: Basic Metabolic Panel: Recent Labs  Lab 02/22/19 1431 02/23/19 0029 02/23/19 0746 02/24/19 0401  NA 140  --  141 141  K 4.4  --  4.1 4.1  CL 103  --  101 106  CO2 28  --  26 25  GLUCOSE 89  --  79 93  BUN 15  --  19 18  CREATININE 1.08* 1.08* 1.24* 1.25*  CALCIUM 10.6*  --  9.5   9.2 9.0  MG  --   --   --  1.8   Liver Function Tests: Recent Labs  Lab 02/23/19 0746  AST  37  ALT 21  ALKPHOS 98  BILITOT 0.6  PROT 6.7  ALBUMIN 3.5   No results for input(s): LIPASE, AMYLASE in the last 168 hours. No results for input(s): AMMONIA in the last 168 hours. CBC: Recent Labs  Lab 02/22/19 1431 02/23/19 0029 02/23/19 0746  WBC 5.8 7.2 6.7  NEUTROABS 3.9  --  4.9  HGB 13.2 13.4 13.4  HCT 41.5 41.6 41.6  MCV 93.3 91.8 92.7  PLT 237 233 237   Cardiac Enzymes: Recent Labs  Lab 02/22/19 1949 02/23/19 0029 02/23/19 0746 02/23/19 1402  TROPONINI 0.14* 0.14* 0.14* 0.13*   BNP: BNP (last 3 results) No results for input(s): BNP in the last 8760 hours.  ProBNP (last 3 results) No results for input(s): PROBNP in the last 8760 hours.  CBG: No results for input(s): GLUCAP in the last 168 hours.     Signed:  Florencia Reasons MD, PhD  [facility] 02/24/2019, 12:55 PM

## 2019-02-25 ENCOUNTER — Telehealth: Payer: Self-pay | Admitting: *Deleted

## 2019-02-25 NOTE — Telephone Encounter (Signed)
Transition Care Management Follow-up Telephone Call   Date discharged? 02/24/19   How have you been since you were released from the hospital? "Doing well"   Do you understand why you were in the hospital? yes   Do you understand the discharge instructions? yes   Where were you discharged to? home   Items Reviewed:  Medications reviewed: yes  Allergies reviewed: yes  Dietary changes reviewed: yes  Referrals reviewed: yes   Functional Questionnaire:   Activities of Daily Living (ADLs):   She states they are independent in the following: ambulation, bathing and hygiene, feeding, continence, grooming, toileting and dressing States they require assistance with the following: n/a   Any transportation issues/concerns?: no   Any patient concerns? no   Confirmed importance and date/time of follow-up visits scheduled yes  Provider Appointment booked with  Confirmed with patient if condition begins to worsen call PCP or go to the ER.  Patient was given the office number and encouraged to call back with question or concerns.  : yes

## 2019-02-26 NOTE — Progress Notes (Signed)
[place] at [facility] [address], [place], [place] [address] [phone]  Date:  02/28/2019   Name:  [name]   DOB:  Dec 25, 1930   MRN:  [id number]  PCP:  Darreld Mclean, MD    Chief Complaint: No chief complaint on file.   History of Present Illness:  [name] is a 85 y.o. very pleasant female patient who presents with the following:  Telephone visit today for hospital follow-up Recent admission with hypertensive urgency -see discharge summary below Virtual visit today Pt location is home Provider location is office Pt ID confirmed with 2 identifiers, she consents for virtual visit today  Admit date: 02/22/2019 Discharge date: 02/24/2019 Recommendations for Outpatient Follow-up:  1. F/u with PCP within a week  for hospital discharge follow up, repeat cbc/bmp at follow up 2. F/u with cardiology  Discharge Diagnoses:      Active Hospital Problems   Diagnosis Date Noted  . Systolic murmur 55/37/4827  . Hypertensive urgency 02/23/2019  . Hypercalcemia   . AKI (acute kidney injury) ([place])   . Hypertensive emergency without congestive heart failure 02/22/2019  . Dizziness 02/22/2019  . Elevated troponin 02/22/2019  . Raynaud's phenomenon 02/28/2016  . Breast cancer [facility])     Resolved Hospital Problems  No resolved problems to display.   Chief Complaint:Dizziness. Elevated blood pressure. MBE:MLJQG B Priceis a 85 y.o.femalewithhistory of hyperlipidemia and hypothyroidism who walks daily about 7 miles started feeling dizzy since this morning. Patient states 3 to 4 days ago patient had some upper back pain and has been monitoring her blood pressure which has been increasing last 2 days. Concerned with that patient had gone to the fire station where patient has blood pressure was found to be elevated and was transferred to the ER. Patient also was finding it difficult to walk because of dizziness.  Denies any headache or visual symptoms. Denies any weakness of the upper or lower extremity. Lower back pain which patient had 3 to 4 days ago resolved after patient took ibuprofen.  ED Course:In the ER patient blood pressure 191/89 which improved without any intervention. UA showing features concerning for UTI. CT head and MRI brain does not show anything acute but shows a 7 mm. Meningioma which is appears to be chronic. EKG shows sinus bradycardia heart rate around 55 bpm. Troponin turned out to be mildly positive at 1.14. Cardiology was consulted by ER physician who recommended trending troponins and admitting for observation. In addition patient's labs show mildly elevated calcium of 10.6 creatinine is 1.08.  Hospital Course:  Active Problems:   Breast cancer ([place])   Raynaud's phenomenon   Hypertensive emergency without congestive heart failure   Dizziness   Elevated troponin   Systolic murmur   Hypertensive urgency   Hypercalcemia   AKI (acute kidney injury) ([place])  HTN urgency with dizziness, elevated troponin -no prior history of HTN -MRI brain no acute findings -echo LVEF wnl, does has mild LVH and mild diastolic dysfunction -heart healthy diet, cards consulted who recommended start verapamil , she will follow up with cardiology in several weeks, cardiology will arrange follow up  AKI on CKDIII vs progressive ckd from uncontrolled HTN Cr baseline 0.98, cr today is 1.24 Urine culture no growth, she denies urinary symptom Renal US does show medical renal disease, no obstruction Renal dosing meds D/c ibuprofen  pcp to monitor renal function  Mild hypercalcemia- Likely from dehydration, cr normalized  aftergently hydrate  PTH level and 25oh vit d level unremarkable History of Raynaud's phenomenon. Has been having some cold upper extremities with discoloration off and on as per the daughter. HLD on statin  Hypothyroidism, stable on synthroid History of breast  cancer-s/p lumpectomy, radiation, anastrozole(now off).  In remission without disease recurrence. Last saw Dr. Alvy Bimler in 2015   Since returning home, [name] notes that she feels fine.  She is checking her blood pressure 3 times a day.  She notes that her BP is running better with the verapamil Reading this morning: 129/60, pulse 64 Yesterday 149/69, recheck 129/69, P 71, 130/65, P 68  She feels overall fine.  However on occasion she might feel lightheaded- she is not sure if this might be anxiety No vertigo sx   She walked about 5 miles yesterday and felt fine, no CP or SOB  Her appetite is ok  She has a cardiology appt on 6/3 with Dr. Harrell Gave  She also has an appoint with me in August  We discussed her mild renal insufficiency on recent labs, I will bring her in for repeat labs later this week as requested on her discharge summary  Patient Active Problem List   Diagnosis Date Noted  . CKD (chronic kidney disease), stage II   . Systolic murmur 23/76/2831  . Hypertensive urgency 02/23/2019  . Hypercalcemia   . AKI (acute kidney injury) ([place])   . Hypertensive emergency without congestive heart failure 02/22/2019  . Dizziness 02/22/2019  . Elevated troponin 02/22/2019  . Squamous cell carcinoma in situ of skin 11/30/2018  . Diarrhea 09/08/2017  . Nausea vomiting and diarrhea 09/08/2017  . Elevated serum creatinine 09/08/2017  . PVC (premature ventricular contraction) 09/08/2017  . Glaucoma 09/08/2017  . Hx of meningioma of the brain 08/28/2017  . Hoarseness of voice 02/28/2016  . Raynaud's phenomenon 02/28/2016  . Esophageal spasm 09/20/2013  . Osteoporosis 03/18/2012  . Fracture, shoulder 03/06/2012  . Hyperlipidemia   . Breast cancer [facility])     Past Medical History:  Diagnosis Date  . Autoimmune disease ([place])   . Barrett's esophagus   . Bone lesion    sacrum  . Breast cancer ([place]) 2010   cT1 N0 M0; ER+/ PR+/ HER2 neg; s/p lumpectomy 07/2010; started adjuvant  hormonal therapy 10/2009  . Cervical cancer ([place])   . Dysphagia   . Fracture of left shoulder   . Fx distal radius NEC-closed   . Glaucoma   . Humerus fracture 03/2012   impacted left humueral neck fracture, treated by Dr. Amedeo Plenty  . Hyperlipidemia   . Hypertension   . Neuromuscular disorder ([place])   . Osteopenia   . Osteoporosis   . Osteoporosis 03/18/2012  . Personal history of chemotherapy 2010   Left Breast Cancer  . Personal history of radiation therapy 2010   Left Breast Cancer  . Pneumonia   . Thyroid disease     Past Surgical History:  Procedure Laterality Date  . ABDOMINAL HYSTERECTOMY  1972   cancer          1 tube 1 ovary  . APPENDECTOMY    . BREAST LUMPECTOMY Left 2010  . colon polyp removal    . EYE SURGERY    . HAND SURGERY    . ortho procedures     multiple  . OVARIAN CYST REMOVAL  1978  . SHOULDER SURGERY  1954   left    Social History   Tobacco Use  . Smoking status: Former  Smoker    Last attempt to quit: 12/16/1986    Years since quitting: 32.2  . Smokeless tobacco: Never Used  . Tobacco comment: FORMER SMOKER 30 YEARS AGO  Substance Use Topics  . Alcohol use: Yes    Alcohol/week: 0.0 standard drinks    Comment: OCCASIONALLY  . Drug use: No    Family History  Problem Relation Age of Onset  . Heart disease Father   . Cancer Sister        lung, kidney  . Cancer Brother        brain  . Cancer Paternal Aunt        breast  . Breast cancer Paternal Aunt   . Cancer Brother        liver, lung  . Osteoporosis Neg Hx     Allergies  Allergen Reactions  . Daypro [Oxaprozin] Swelling    Face and tongue  . Prolia [Denosumab]     Medication list has been reviewed and updated.  Current Outpatient Medications on File Prior to Visit  Medication Sig Dispense Refill  . acetaminophen (TYLENOL) 325 MG tablet Take 2 tablets (650 mg total) by mouth every 6 (six) hours as needed for mild pain (or Fever >/= 101). 30 tablet 0  . Calcium Carbonate-Vitamin  D (CALCIUM + D PO) Take 1 capsule by mouth daily. Taking 2 pills per day    . latanoprost (XALATAN) 0.005 % ophthalmic solution Place 1 drop into both eyes at bedtime.     Marland Kitchen levothyroxine (SYNTHROID, LEVOTHROID) 50 MCG tablet Take 1 tablet (50 mcg total) by mouth daily before breakfast. 90 tablet 3  . Multiple Vitamin (MULTIVITAMIN) capsule Take 1 capsule by mouth daily.    . rosuvastatin (CRESTOR) 10 MG tablet TAKE 1 BY MOUTH DAILY (Patient taking differently: Take 10 mg by mouth daily. TAKE 1 BY MOUTH DAILY) 90 tablet 3  . senna-docusate (SENOKOT-S) 8.6-50 MG tablet Take 1 tablet by mouth at bedtime. 30 tablet 0  . verapamil (CALAN-SR) 120 MG CR tablet Take 1 tablet (120 mg total) by mouth daily. 30 tablet 0   No current facility-administered medications on file prior to visit.     Review of Systems:  As per HPI- otherwise negative. No cough or fever noted   Physical Examination: There were no vitals filed for this visit. There were no vitals filed for this visit. There is no height or weight on file to calculate BMI. Ideal Body Weight:    Spoke with patient on telephone.  She sounds well, no cough, wheezing, distress is noted.  She is alert and oriented, mentally sharp   Assessment and Plan: Hospital discharge follow-up - Plan: CBC, Basic metabolic panel  Hypertensive urgency - Plan: CBC, Basic metabolic panel  Mixed hyperlipidemia  Acquired hypothyroidism  Virtual visit today for hospital follow-up.  Deshanta was admitted for 2 nights with a hypertensive urgency.  She had a thorough work-up, and thankfully everything really looked fine.  She did have mild renal insufficiency which we will recheck- She would like to come in for labs this coming Thursday am Her thyroid was in range during hospital admission, cholesterol looked excellent Her blood pressure has looked fine, she is checking it 3 times a day at home Invited all questions  Cardiology appointment in about 2 weeks,  appoint with myself scheduled for August I have asked her to let myself or Dr. Harrell Gave know if her blood pressure begins to run high again She reports that she had  both her shingrix vaccines at the drug store  Spoke to pt for 13 minutes today  Signed Lamar Blinks, MD

## 2019-02-28 ENCOUNTER — Ambulatory Visit (INDEPENDENT_AMBULATORY_CARE_PROVIDER_SITE_OTHER): Payer: Medicare Other | Admitting: Family Medicine

## 2019-02-28 ENCOUNTER — Other Ambulatory Visit: Payer: Self-pay

## 2019-02-28 ENCOUNTER — Telehealth: Payer: Self-pay

## 2019-02-28 ENCOUNTER — Encounter: Payer: Self-pay | Admitting: Family Medicine

## 2019-02-28 DIAGNOSIS — E039 Hypothyroidism, unspecified: Secondary | ICD-10-CM

## 2019-02-28 DIAGNOSIS — E782 Mixed hyperlipidemia: Secondary | ICD-10-CM

## 2019-02-28 DIAGNOSIS — I160 Hypertensive urgency: Secondary | ICD-10-CM

## 2019-02-28 DIAGNOSIS — Z09 Encounter for follow-up examination after completed treatment for conditions other than malignant neoplasm: Secondary | ICD-10-CM

## 2019-02-28 NOTE — Telephone Encounter (Signed)
Called to speak to daughter [name] after checking HIPPA form. Went over her questions  [name] thinks that anxiety is playing a role in her BP and that Tymesha is not always forthcoming with her BP readings at home We appreciate this information  Phone call 16:48

## 2019-02-28 NOTE — Telephone Encounter (Signed)
Copied from [place] [id number]. Topic: General - Inquiry >> Feb 28, 2019  9:58 AM Berneta Levins wrote: Reason for CRM:   Pt's daughter, [name], calling.  States she needs to speak with PCP after pt had a phone visit this morning.  States that not everything was discussed by the patient.  [name] is available all day except from 2-3pm. [name] can be reached at [phone]

## 2019-03-01 ENCOUNTER — Other Ambulatory Visit: Payer: Self-pay

## 2019-03-01 ENCOUNTER — Ambulatory Visit: Payer: Self-pay | Admitting: *Deleted

## 2019-03-01 ENCOUNTER — Telehealth: Payer: Self-pay

## 2019-03-01 ENCOUNTER — Encounter (HOSPITAL_COMMUNITY): Payer: Self-pay

## 2019-03-01 ENCOUNTER — Emergency Department (HOSPITAL_COMMUNITY)
Admission: EM | Admit: 2019-03-01 | Discharge: 2019-03-02 | Disposition: A | Discharge status: Home / Self Care | Payer: Medicare Other | Attending: Emergency Medicine | Admitting: Emergency Medicine

## 2019-03-01 ENCOUNTER — Telehealth: Payer: Self-pay | Admitting: Physician Assistant

## 2019-03-01 DIAGNOSIS — I491 Atrial premature depolarization: Secondary | ICD-10-CM | POA: Diagnosis not present

## 2019-03-01 DIAGNOSIS — Z79899 Other long term (current) drug therapy: Secondary | ICD-10-CM | POA: Insufficient documentation

## 2019-03-01 DIAGNOSIS — Z853 Personal history of malignant neoplasm of breast: Secondary | ICD-10-CM | POA: Insufficient documentation

## 2019-03-01 DIAGNOSIS — I10 Essential (primary) hypertension: Secondary | ICD-10-CM

## 2019-03-01 DIAGNOSIS — Z8541 Personal history of malignant neoplasm of cervix uteri: Secondary | ICD-10-CM | POA: Diagnosis not present

## 2019-03-01 DIAGNOSIS — E079 Disorder of thyroid, unspecified: Secondary | ICD-10-CM | POA: Insufficient documentation

## 2019-03-01 DIAGNOSIS — R42 Dizziness and giddiness: Secondary | ICD-10-CM | POA: Diagnosis not present

## 2019-03-01 DIAGNOSIS — Z85828 Personal history of other malignant neoplasm of skin: Secondary | ICD-10-CM | POA: Insufficient documentation

## 2019-03-01 DIAGNOSIS — R5381 Other malaise: Secondary | ICD-10-CM | POA: Diagnosis not present

## 2019-03-01 NOTE — Telephone Encounter (Signed)
Called patient to schedule lab appointment for Thursday, however our lab appointments are full. Explained to her that she can walk in at [place] office without appointment. Changed orders to lab collect at [place] states she will go to [place] office and have drawn.

## 2019-03-01 NOTE — Telephone Encounter (Signed)
[name]'s blood pressure spiked up to over 735 systolic today and her daughter took her back to the emergency room.  She had another episode a day or so ago where her systolic blood pressure spiked up to almost 200, but she was able to rest and it came back down.  There was no precipitating event, she is not otherwise in distress.  The daughter wished for Dr. Harrell Gave to be aware of this and let her know if there is anything else that needs to be done.  I advised that the ER staff would treat her and let us know if there is anything they wished [place] to do.  Lori Ferries, PA-C 03/01/2019 6:59 PM [place] [phone]

## 2019-03-01 NOTE — ED Triage Notes (Signed)
Pt BIB [facility] for eval of HTN. Pt reports she checked BP at home and stated that she noted she was in the 180s. Pt denies complaints associated w/ HTN. GCS 15

## 2019-03-01 NOTE — Telephone Encounter (Signed)
Pt called regarding her b/p readings this afternoon started out at 186/89 Hr 59 at 4:30. 5 mins later it was 178/91 Hr 20 and 5 mins later 194/85 Hr 547. Then she called the office. She stated she had been in the kitchen all day. She denies chest pain, shortness of breath, blurred vision or weakness. She stated that she feels light headed. She feels funny. She had been hospitalized last week for elevated b/p. And she was started on a  B/p medication Friday. She has not missed any doses. She rechecked her b/p now it is 221/94 Hr 74 and 219/98 Hr. 71. Advised pt to call EMS to assess her. Pt voiced understanding. Routing to LB at [facility] for Review.  Reason for Disposition . [3] Systolic BP  >= 112 OR Diastolic >= 162 AND [4] cardiac or neurologic symptoms (e.g., chest pain, difficulty breathing, unsteady gait, blurred vision)  Answer Assessment - Initial Assessment Questions 1. BLOOD PRESSURE: "What is the blood pressure?" "Did you take at least two measurements 5 minutes apart?"    221/94 HR 74  And 219/98  HR 71 2. ONSET: "When did you take your blood pressure?"     now 3. HOW: "How did you obtain the blood pressure?" (e.g., visiting nurse, automatic home BP monitor)    Automatic home BP monitor 4. HISTORY: "Do you have a history of high blood pressure?"     yes 5. MEDICATIONS: "Are you taking any medications for blood pressure?" "Have you missed any doses recently?"     Started taking the medications on 02/25/2019 6. OTHER SYMPTOMS: "Do you have any symptoms?" (e.g., headache, chest pain, blurred vision, difficulty breathing, weakness)     Light headed 7. PREGNANCY: "Is there any chance you are pregnant?" "When was your last menstrual period?"     n/a  Protocols used: HIGH BLOOD PRESSURE-A-AH

## 2019-03-01 NOTE — Addendum Note (Signed)
Addended by: Caffie Pinto on: 03/01/2019 10:49 AM   Modules accepted: Orders

## 2019-03-01 NOTE — ED Notes (Signed)
Daughter called and request we check pt vitals in waiting area again, relayed this message to [place]

## 2019-03-02 ENCOUNTER — Telehealth: Payer: Self-pay

## 2019-03-02 LAB — CBC WITH DIFFERENTIAL/PLATELET
Abs Immature Granulocytes: 0.02 10*3/uL (ref 0.00–0.07)
Basophils Absolute: 0 10*3/uL (ref 0.0–0.1)
Basophils Relative: 0 %
Eosinophils Absolute: 0.2 10*3/uL (ref 0.0–0.5)
Eosinophils Relative: 3 %
HCT: 40 % (ref 36.0–46.0)
Hemoglobin: 12.7 g/dL (ref 12.0–15.0)
Immature Granulocytes: 0 %
Lymphocytes Relative: 25 %
Lymphs Abs: 1.9 10*3/uL (ref 0.7–4.0)
MCH: 29.7 pg (ref 26.0–34.0)
MCHC: 31.8 g/dL (ref 30.0–36.0)
MCV: 93.7 fL (ref 80.0–100.0)
Monocytes Absolute: 0.6 10*3/uL (ref 0.1–1.0)
Monocytes Relative: 7 %
Neutro Abs: 5 10*3/uL (ref 1.7–7.7)
Neutrophils Relative %: 65 %
Platelets: 234 10*3/uL (ref 150–400)
RBC: 4.27 MIL/uL (ref 3.87–5.11)
RDW: 15.2 % (ref 11.5–15.5)
WBC: 7.8 10*3/uL (ref 4.0–10.5)
nRBC: 0 % (ref 0.0–0.2)

## 2019-03-02 LAB — BASIC METABOLIC PANEL
Anion gap: 9 (ref 5–15)
BUN: 17 mg/dL (ref 8–23)
CO2: 26 mmol/L (ref 22–32)
Calcium: 10 mg/dL (ref 8.9–10.3)
Chloride: 105 mmol/L (ref 98–111)
Creatinine, Ser: 0.87 mg/dL (ref 0.44–1.00)
GFR calc Af Amer: >60 mL/min (ref >60)
GFR calc non Af Amer: >60 mL/min (ref >60)
Glucose, Bld: 83 mg/dL (ref 70–99)
Potassium: 4.3 mmol/L (ref 3.5–5.1)
Sodium: 140 mmol/L (ref 135–145)

## 2019-03-02 LAB — TROPONIN I: Troponin I: 0.11 ng/mL (ref <0.03)

## 2019-03-02 NOTE — Discharge Instructions (Addendum)
You were seen today for high blood pressure.  Your work-up is largely reassuring.  It is very important that you follow-up with your primary physician and cardiology for adjustment in your blood pressure medication.  If you develop headache, chest pain, shortness of breath, any new or worsening symptoms you should be reevaluated.

## 2019-03-02 NOTE — Telephone Encounter (Signed)
I spoke with [name] on Monday, this is an old message

## 2019-03-02 NOTE — Telephone Encounter (Signed)
Copied from [place] [phone]. Topic: General - Inquiry >> Feb 28, 2019  9:58 AM Berneta Levins wrote: Reason for CRM:   Pt's daughter, [name], calling.  States she needs to speak with PCP after pt had a phone visit this morning.  States that not everything was discussed by the patient.  [name] is available all day except from 2-3pm. [name] can be reached at [phone] >> Mar 02, 2019  8:03 AM Carolyn Stare wrote:   Pt daughter just following up on a call back from Dr Lorelei Pont

## 2019-03-02 NOTE — ED Notes (Signed)
Dr Dina Rich @ bedside

## 2019-03-02 NOTE — ED Provider Notes (Signed)
[facility] EMERGENCY DEPARTMENT Provider Note   CSN: [id number] Arrival date & time: 03/01/19  1827    History   Chief Complaint Chief Complaint  Patient presents with  . Hypertension    HPI [name] is a 85 y.o. female.     HPI  This is an 85 year old female with a history of hypertension, hyperlipidemia who presents with high blood pressure.  Patient was recently seen and evaluated and spent 2 nights in the hospital for hypertensive urgency.  At that time she had a slightly elevated troponin, negative head imaging including CT and MRI and reassuring echocardiogram.  She was started on verapamil.  Patient reports that since April she has had blood pressures that have been "all over the place."  Denies any prior history of high blood pressure.  She states normally she feels dizzy and lightheaded when her blood pressure is high.  Today she felt the same and went to the fire department and had a blood pressure of greater than 200.  She was brought here for further evaluation.  She denies headache, chest pain, shortness of breath, abdominal pain, nausea, vomiting.  She states she just feels lightheaded.  She reports compliance with her verapamil.  Past Medical History:  Diagnosis Date  . Autoimmune disease ([place])   . Barrett's esophagus   . Bone lesion    sacrum  . Breast cancer ([place]) 2010   cT1 N0 M0; ER+/ PR+/ HER2 neg; s/p lumpectomy 07/2010; started adjuvant hormonal therapy 10/2009  . Cervical cancer ([place])   . Dysphagia   . Fracture of left shoulder   . Fx distal radius NEC-closed   . Glaucoma   . Humerus fracture 03/2012   impacted left humueral neck fracture, treated by Dr. Amedeo Plenty  . Hyperlipidemia   . Hypertension   . Neuromuscular disorder ([place])   . Osteopenia   . Osteoporosis   . Osteoporosis 03/18/2012  . Personal history of chemotherapy 2010   Left Breast Cancer  . Personal history of radiation therapy 2010   Left Breast Cancer  . Pneumonia    . Thyroid disease     Patient Active Problem List   Diagnosis Date Noted  . CKD (chronic kidney disease), stage II   . Systolic murmur 24/82/5003  . Hypertensive urgency 02/23/2019  . Hypercalcemia   . AKI (acute kidney injury) ([place])   . Hypertensive emergency without congestive heart failure 02/22/2019  . Dizziness 02/22/2019  . Elevated troponin 02/22/2019  . Squamous cell carcinoma in situ of skin 11/30/2018  . Diarrhea 09/08/2017  . Nausea vomiting and diarrhea 09/08/2017  . Elevated serum creatinine 09/08/2017  . PVC (premature ventricular contraction) 09/08/2017  . Glaucoma 09/08/2017  . Hx of meningioma of the brain 08/28/2017  . Hoarseness of voice 02/28/2016  . Raynaud's phenomenon 02/28/2016  . Esophageal spasm 09/20/2013  . Osteoporosis 03/18/2012  . Fracture, shoulder 03/06/2012  . Hyperlipidemia   . Breast cancer [facility])     Past Surgical History:  Procedure Laterality Date  . ABDOMINAL HYSTERECTOMY  1972   cancer          1 tube 1 ovary  . APPENDECTOMY    . BREAST LUMPECTOMY Left 2010  . colon polyp removal    . EYE SURGERY    . HAND SURGERY    . ortho procedures     multiple  . OVARIAN CYST REMOVAL  1978  . [place]   left  OB History   No obstetric history on file.      Home Medications    Prior to Admission medications   Medication Sig Start Date End Date Taking? Authorizing Provider  acetaminophen (TYLENOL) 325 MG tablet Take 2 tablets (650 mg total) by mouth every 6 (six) hours as needed for mild pain (or Fever >/= 101). 02/24/19  Yes Florencia Reasons, MD  Calcium Carbonate-Vitamin D (CALCIUM + D PO) Take 1 capsule by mouth daily.    Yes [provider]  latanoprost (XALATAN) 0.005 % ophthalmic solution Place 1 drop into both eyes at bedtime.    Yes [provider]  levothyroxine (SYNTHROID, LEVOTHROID) 50 MCG tablet Take 1 tablet (50 mcg total) by mouth daily before breakfast. 08/23/18  Yes Copland, Gay Filler, MD   Multiple Vitamin (MULTIVITAMIN WITH MINERALS) TABS tablet Take 1 tablet by mouth daily.   Yes [provider]  rosuvastatin (CRESTOR) 10 MG tablet TAKE 1 BY MOUTH DAILY Patient taking differently: Take 10 mg by mouth daily. TAKE 1 BY MOUTH DAILY 02/21/19  Yes Copland, Gay Filler, MD  verapamil (CALAN-SR) 120 MG CR tablet Take 1 tablet (120 mg total) by mouth daily. 02/25/19  Yes Florencia Reasons, MD  senna-docusate (SENOKOT-S) 8.6-50 MG tablet Take 1 tablet by mouth at bedtime. Patient not taking: Reported on 03/02/2019 02/24/19   Florencia Reasons, MD    Family History Family History  Problem Relation Age of Onset  . Heart disease Father   . Cancer Sister        lung, kidney  . Cancer Brother        brain  . Cancer Paternal Aunt        breast  . Breast cancer Paternal Aunt   . Cancer Brother        liver, lung  . Osteoporosis Neg Hx     Social History Social History   Tobacco Use  . Smoking status: Former Smoker    Last attempt to quit: 12/16/1986    Years since quitting: 32.2  . Smokeless tobacco: Never Used  . Tobacco comment: FORMER SMOKER 30 YEARS AGO  Substance Use Topics  . Alcohol use: Yes    Alcohol/week: 0.0 standard drinks    Comment: OCCASIONALLY  . Drug use: No     Allergies   Daypro [oxaprozin] and Prolia [denosumab]   Review of Systems Review of Systems  Constitutional: Negative for fever.  Respiratory: Negative for shortness of breath.   Cardiovascular: Negative for chest pain.  Gastrointestinal: Negative for abdominal pain, nausea and vomiting.  Genitourinary: Negative for dysuria.  Neurological: Positive for dizziness and light-headedness. Negative for speech difficulty, weakness and numbness.  All other systems reviewed and are negative.    Physical Exam Updated Vital Signs BP 132/73   Pulse (!) 50   Temp 97.6 F (36.4 C) (Oral)   Resp 18   Ht 1.6 m ('5\' 3"'$ )   Wt 50 kg   SpO2 100%   BMI 19.53 kg/m   Physical Exam Vitals signs and nursing  note reviewed.  Constitutional:      Appearance: She is well-developed.  HENT:     Head: Normocephalic and atraumatic.  Eyes:     Pupils: Pupils are equal, round, and reactive to light.  Neck:     Musculoskeletal: Neck supple.  Cardiovascular:     Rate and Rhythm: Normal rate and regular rhythm.     Heart sounds: Normal heart sounds.  Pulmonary:     Effort:  Pulmonary effort is normal. No respiratory distress.     Breath sounds: No wheezing.  Abdominal:     General: Bowel sounds are normal.     Palpations: Abdomen is soft.  Musculoskeletal:     Right lower leg: No edema.     Left lower leg: No edema.  Skin:    General: Skin is warm and dry.  Neurological:     Mental Status: She is alert and oriented to person, place, and time.     Comments: Cranial nerves II through XII intact, 5 out of 5 strength in all 4 extremities, no dysmetria to finger-nose-finger  Psychiatric:        Mood and Affect: Mood normal.      ED Treatments / Results  Labs (all labs ordered are listed, but only abnormal results are displayed) Labs Reviewed  TROPONIN I - Abnormal; Notable for the following components:      Result Value   Troponin I 0.11 (*)    All other components within normal limits  CBC WITH DIFFERENTIAL/PLATELET  BASIC METABOLIC PANEL    EKG EKG Interpretation  Date/Time:  Wednesday Mar 02 2019 00:23:19 EDT Ventricular Rate:  52 PR Interval:    QRS Duration: 92 QT Interval:  457 QTC Calculation: 425 R Axis:   63 Text Interpretation:  Sinus rhythm Atrial premature complex Probable left atrial enlargement No significant change since last tracing Confirmed by Thayer Jew [phone]) on 03/02/2019 1:00:04 AM   Radiology No results found.  Procedures Procedures (including critical care time)  Medications Ordered in ED Medications - No data to display   Initial Impression / Assessment and Plan / ED Course  I have reviewed the triage vital signs and the nursing notes.   Pertinent labs & imaging results that were available during my care of the patient were reviewed by me and considered in my medical decision making (see chart for details).        Patient presents with persistent high blood pressure.  Recent admission for hypertensive urgency.  She is overall nontoxic.  Initial blood pressure in the 914 systolic.  This is down trended without intervention.  EKG shows no signs of ischemia or arrhythmia.  Basic lab work is reassuring.  Troponin is stable at 0.11.  This is very comparable to her prior admission.  I do not feel this reflects ACS.  Patient is otherwise asymptomatic and only reports intermittent dizziness that fluctuates with her blood pressure.  She does not appear to be having a stroke and recently had a CT and an MRI.  She is otherwise neurologically intact.  I have reassured the patient.  She has no signs or symptoms of hypertensive urgency or emergency right now.  Recommend close follow-up with cardiology and her primary physician for adjustments in her blood pressure medication.  After history, exam, and medical workup I feel the patient has been appropriately medically screened and is safe for discharge home. Pertinent diagnoses were discussed with the patient. Patient was given return precautions.  Final Clinical Impressions(s) / ED Diagnoses   Final diagnoses:  Essential hypertension    ED Discharge Orders    None       Elijha Dedman, Barbette Hair, MD 03/02/19 [phone]

## 2019-03-02 NOTE — ED Notes (Signed)
Attempted to call pt's daughter, no answer. Will attempt again.

## 2019-03-02 NOTE — ED Notes (Signed)
Pt updated on status and patients daughter [name] called back and was updated on patients status too.

## 2019-03-02 NOTE — ED Notes (Signed)
Pts daughter [name] called back after patients discharge. Primary RN returned call and answers all questions family had

## 2019-03-02 NOTE — ED Notes (Signed)
Patient verbalizes understanding of discharge instructions. Opportunity for questioning and answers were provided. Armband removed by staff, pt discharged from ED in wheelchair.  

## 2019-03-02 NOTE — ED Notes (Signed)
[name] [phone]

## 2019-03-03 ENCOUNTER — Telehealth: Payer: Self-pay | Admitting: Family Medicine

## 2019-03-03 NOTE — Telephone Encounter (Addendum)
Daughter updated with MD's recommendations and voiced understanding. She report pt is checking BP 3 times a day as instructed by pcp and since discharge BP has been WNL. Daughter also state nurse at discharge mentioned the MD may want to see pt sooner than scheduled appointment on 6/3. Advised daughter that MD will be out of the office next week but if she would like to be seen sooner, we can schedule appointment with APP. Daughter state she is ok with keeping scheduled appointment and will have pt follow up with pcp in the meantime. Pt voiced understanding to call our office if BP remains consistently high or pt becomes symptomatic.

## 2019-03-03 NOTE — Telephone Encounter (Signed)
Can you have [name] start a BP log (if she hasn't already)? I would like to know lows and high blood pressures. We have an upcoming appt, and it would be helpful if we can know what the range of BP is. Her BP does go back down just with resting, so if it is very high again, I would have her rest for 20-30 min and then retake (as long as she feels ok, it is safe to wait). Thanks.

## 2019-03-15 ENCOUNTER — Telehealth: Payer: Self-pay | Admitting: Cardiology

## 2019-03-15 NOTE — Telephone Encounter (Signed)
Mychart pending, no smartphone, consent, pre reg complete 03/15/19 AF

## 2019-03-16 ENCOUNTER — Ambulatory Visit: Payer: Medicare Other | Admitting: Cardiology

## 2019-03-18 ENCOUNTER — Encounter: Payer: Self-pay | Admitting: Cardiology

## 2019-03-18 ENCOUNTER — Telehealth (INDEPENDENT_AMBULATORY_CARE_PROVIDER_SITE_OTHER): Payer: Medicare Other | Admitting: Cardiology

## 2019-03-18 VITALS — BP 127/75 | HR 71 | Ht 63.5 in | Wt 106.8 lb

## 2019-03-18 DIAGNOSIS — Z7189 Other specified counseling: Secondary | ICD-10-CM

## 2019-03-18 DIAGNOSIS — I10 Essential (primary) hypertension: Secondary | ICD-10-CM | POA: Diagnosis not present

## 2019-03-18 NOTE — Patient Instructions (Signed)

## 2019-03-18 NOTE — Progress Notes (Signed)
Virtual Visit via Telephone Note   This visit type was conducted due to national recommendations for restrictions regarding the COVID-19 Pandemic (e.g. social distancing) in an effort to limit this patient's exposure and mitigate transmission in our community.  Due to her co-morbid illnesses, this patient is at least at moderate risk for complications without adequate follow up.  This format is felt to be most appropriate for this patient at this time.  The patient did not have access to video technology/had technical difficulties with video requiring transitioning to audio format only (telephone).  All issues noted in this document were discussed and addressed.  No physical exam could be performed with this format.  Please refer to the patient's chart for her  consent to telehealth for [facility].   Date:  03/18/2019   ID:  [name], DOB 1931-05-12, MRN [id number]  Patient Location: Home Provider Location: Home  PCP:  Copland, Gay Filler, MD  Cardiologist:  Buford Dresser, MD  Electrophysiologist:  None   Evaluation Performed:  Follow-Up Visit  Chief Complaint:  Elevated blood pressure  History of Present Illness:    [name] is a 84 y.o. female with recent hospitalization for hypertensive emergency. No prior cardiac PMH but history of breast cancer, skin cancer, cervical cancer, hypothyroidism, hyperlipidemia, osteoporosis, meningioma. I met her in the hospital in 02/2019 during an admission for hypertensive emergency.  The patient does not have symptoms concerning for COVID-19 infection (fever, chills, cough, or new shortness of breath).   Today: She is doing very well. Feeling like herself again. Back to walking, no issues. Had the episode on 5/19 of elevated BP, but by the time she got to the ER BP was improved. No more issues since then. She is back to walking several miles/day without issue. Tolerating medication well. No low blood pressures.   She is social  distancing well, no symptoms or exposure history for COVID.  Denies chest pain, shortness of breath at rest or with normal exertion. No PND, orthopnea, LE edema or unexpected weight gain. No syncope or palpitations.  Past Medical History:  Diagnosis Date  . Autoimmune disease ([place])   . Barrett's esophagus   . Bone lesion    sacrum  . Breast cancer ([place]) 2010   cT1 N0 M0; ER+/ PR+/ HER2 neg; s/p lumpectomy 07/2010; started adjuvant hormonal therapy 10/2009  . Cervical cancer ([place])   . Dysphagia   . Fracture of left shoulder   . Fx distal radius NEC-closed   . Glaucoma   . Humerus fracture 03/2012   impacted left humueral neck fracture, treated by Dr. Amedeo Plenty  . Hyperlipidemia   . Hypertension   . Neuromuscular disorder ([place])   . Osteopenia   . Osteoporosis   . Osteoporosis 03/18/2012  . Personal history of chemotherapy 2010   Left Breast Cancer  . Personal history of radiation therapy 2010   Left Breast Cancer  . Pneumonia   . Thyroid disease    Past Surgical History:  Procedure Laterality Date  . ABDOMINAL HYSTERECTOMY  1972   cancer          1 tube 1 ovary  . APPENDECTOMY    . BREAST LUMPECTOMY Left 2010  . colon polyp removal    . EYE SURGERY    . HAND SURGERY    . ortho procedures     multiple  . OVARIAN CYST REMOVAL  1978  . [place]   left  Current Meds  Medication Sig  . Calcium Carbonate-Vitamin D (CALCIUM + D PO) Take 1 capsule by mouth daily.   Marland Kitchen latanoprost (XALATAN) 0.005 % ophthalmic solution Place 1 drop into both eyes at bedtime.   Marland Kitchen levothyroxine (SYNTHROID, LEVOTHROID) 50 MCG tablet Take 1 tablet (50 mcg total) by mouth daily before breakfast.  . Multiple Vitamin (MULTIVITAMIN WITH MINERALS) TABS tablet Take 1 tablet by mouth daily.  . rosuvastatin (CRESTOR) 10 MG tablet TAKE 1 BY MOUTH DAILY (Patient taking differently: Take 10 mg by mouth daily. TAKE 1 BY MOUTH DAILY)  . verapamil (CALAN-SR) 120 MG CR tablet Take 1 tablet (120 mg  total) by mouth daily.     Allergies:   Daypro [oxaprozin] and Prolia [denosumab]   Social History   Tobacco Use  . Smoking status: Former Smoker    Last attempt to quit: 12/16/1986    Years since quitting: 32.2  . Smokeless tobacco: Never Used  . Tobacco comment: FORMER SMOKER 30 YEARS AGO  Substance Use Topics  . Alcohol use: Yes    Alcohol/week: 0.0 standard drinks    Comment: OCCASIONALLY  . Drug use: No     Family Hx: The patient's family history includes Breast cancer in her paternal aunt; Cancer in her brother, brother, paternal aunt, and sister; Heart disease in her father. There is no history of Osteoporosis.  ROS:   Please see the history of present illness.    Constitutional: Negative for chills, fever, night sweats, unintentional weight loss  HENT: Negative for ear pain and hearing loss.   Eyes: Negative for loss of vision and eye pain.  Respiratory: Negative for cough, sputum, wheezing.   Cardiovascular: See HPI. Gastrointestinal: Negative for abdominal pain, melena, and hematochezia.  Genitourinary: Negative for dysuria and hematuria.  Musculoskeletal: Negative for falls and myalgias.  Skin: Negative for itching and rash.  Neurological: Negative for focal weakness, focal sensory changes and loss of consciousness.  Endo/Heme/Allergies: Does not bruise/bleed easily.  All other systems reviewed and are negative.   Prior CV studies:   The following studies were reviewed today: Echo 02/23/19 personally reviewed  Labs/Other Tests and Data Reviewed:    EKG:  An ECG dated 03/02/19 was personally reviewed today and demonstrated:  SR with PAC  Recent Labs: 02/23/2019: ALT 21 02/24/2019: Magnesium 1.8; TSH 2.191 03/02/2019: BUN 17; Creatinine, Ser 0.87; Hemoglobin 12.7; Platelets 234; Potassium 4.3; Sodium 140   Recent Lipid Panel Lab Results  Component Value Date/Time   CHOL 176 02/17/2018 09:59 AM   TRIG 68.0 02/17/2018 09:59 AM   HDL 79.60 02/17/2018 09:59 AM    CHOLHDL 2 02/17/2018 09:59 AM   LDLCALC 83 02/17/2018 09:59 AM    Wt Readings from Last 3 Encounters:  03/18/19 106 lb 12.8 oz (48.4 kg)  03/01/19 110 lb 3.7 oz (50 kg)  02/24/19 108 lb 14.5 oz (49.4 kg)     Objective:    Vital Signs:  BP 127/75   Pulse 71   Ht 5' 3.5" (1.613 m)   Wt 106 lb 12.8 oz (48.4 kg)   BMI 18.62 kg/m   Speaking comfortably on the phone, in no acute distress  ASSESSMENT & PLAN:    Hypertension, with recent admission for hypertensive emergency: this was a new diagnosis for her in the hospital. Her spikes are intermittent, with a normal resting BP but elevations approaching 200 SBP.  -we started a low dose verapamil, and she is tolerating this very well. She did have another episode  of elevated BP shortly after discharge, but since that time she reports normal BP. -no hypotension/side effects reported from verapamil.  -able to exercise with several mild walks daily, no issues -does have mild LVH and mild diastolic dysfunction, evidence that perhaps she has had issues with blood pressure previously that were clinically silent.  COVID-19 Education: The signs and symptoms of COVID-19 were discussed with the patient and how to seek care for testing (follow up with PCP or arrange E-visit).  The importance of social distancing was discussed today.  No change today:  HLD: LDL was 83 mg/gL 02/17/18. -Given age, no indication for aspirin, stopped during her admission -on rosuvastatin 10 mg daily and tolerating.  History of systolic murmur: echo shows aortic sclerosis without stenosis  Time:   Today, I have spent 15 minutes with the patient with telehealth technology discussing the above problems.    Patient Instructions  Medication Instructions:  Your Physician recommend you continue on your current medication as directed.    If you need a refill on your cardiac medications before your next appointment, please call your pharmacy.   Lab work: None   Testing/Procedures: None  Follow-Up: At [facility], you and your health needs are our priority.  As part of our continuing mission to provide you with exceptional heart care, we have created designated Provider Care Teams.  These Care Teams include your primary Cardiologist (physician) and Advanced Practice Providers (APPs -  Physician Assistants and Nurse Practitioners) who all work together to provide you with the care you need, when you need it. You will need a follow up appointment in 6 months.  Please call our office 2 months in advance to schedule this appointment.  You may see Buford Dresser, MD or one of the following Advanced Practice Providers on your designated Care Team:   Rosaria Ferries, PA-C . Jory Sims, DNP, ANP       Medication Adjustments/Labs and Tests Ordered: Current medicines are reviewed at length with the patient today.  Concerns regarding medicines are outlined above.   Tests Ordered: No orders of the defined types were placed in this encounter.   Medication Changes: No orders of the defined types were placed in this encounter.   Disposition:  Follow up 6 mos  Signed, Buford Dresser, MD  03/18/2019 2:54 PM    [facility]

## 2019-03-21 ENCOUNTER — Encounter: Payer: Self-pay | Admitting: Cardiology

## 2019-03-21 DIAGNOSIS — I10 Essential (primary) hypertension: Secondary | ICD-10-CM | POA: Insufficient documentation

## 2019-03-21 HISTORY — DX: Essential (primary) hypertension: I10

## 2019-03-28 ENCOUNTER — Ambulatory Visit: Payer: Self-pay | Admitting: Family Medicine

## 2019-03-28 MED ORDER — VERAPAMIL HCL ER 120 MG PO TBCR: 120.0000 mg | EXTENDED_RELEASE_TABLET | Freq: Every day | ORAL | 3 refills | Status: DC

## 2019-03-28 NOTE — Telephone Encounter (Signed)
Pt called in c/o BP being elevated and she has been feeling light headed recently. She ran out of her medicine that she was started on in the hospital (ED visit)   Verapamil 120 mg.  Missed 2 days now.  BP was fine before running out of this medication.  She was only given a 30 day supply.    I warm transferred the call to Dr. Lillie Fragmin office.    Reason for Disposition . Systolic BP  >= 537 OR Diastolic >= 482    707/867    192/92  Answer Assessment - Initial Assessment Questions 1. BLOOD PRESSURE: "What is the blood pressure?" "Did you take at least two measurements 5 minutes apart?"     *No Answer*My BP 192/92    I'm checking my BP now.  It's 216/101 .   74 pulse I ran out of my pills yesterday. 2. ONSET: "When did you take your blood pressure?"     Just now 3. HOW: "How did you obtain the blood pressure?" (e.g., visiting nurse, automatic home BP monitor)     Automatic BP cuff on upper arm 4. HISTORY: "Do you have a history of high blood pressure?"     Yes 5. MEDICATIONS: "Are you taking any medications for blood pressure?" "Have you missed any doses recently?"     I've missed my medicine for 2 days.   The dr in the hospital started me on BP medicine. 6. OTHER SYMPTOMS: "Do you have any symptoms?" (e.g., headache, chest pain, blurred vision, difficulty breathing, weakness)     I feel good but a little light headed even when I was on my BP pills. 7. PREGNANCY: "Is there any chance you are pregnant?" "When was your last menstrual period?"     Not asked due to age  Protocols used: [place]

## 2019-03-28 NOTE — Addendum Note (Signed)
Addended by: Lamar Blinks C on: 03/28/2019 04:07 PM   Modules accepted: Orders

## 2019-03-28 NOTE — Telephone Encounter (Signed)
Ok- this rx is per cardiology.  I can certainly refill. She was not clear that this med is long term  rx sent to her drug store Called to let her know No CP, 'I feel great" She will start back on the med today   Meds ordered this encounter  Medications  . verapamil (CALAN-SR) 120 MG CR tablet    Sig: Take 1 tablet (120 mg total) by mouth daily.    Dispense:  90 tablet    Refill:  3

## 2019-05-06 DIAGNOSIS — Z961 Presence of intraocular lens: Secondary | ICD-10-CM | POA: Diagnosis not present

## 2019-05-06 DIAGNOSIS — H01002 Unspecified blepharitis right lower eyelid: Secondary | ICD-10-CM | POA: Diagnosis not present

## 2019-05-06 DIAGNOSIS — H401132 Primary open-angle glaucoma, bilateral, moderate stage: Secondary | ICD-10-CM | POA: Diagnosis not present

## 2019-05-06 DIAGNOSIS — H01001 Unspecified blepharitis right upper eyelid: Secondary | ICD-10-CM | POA: Diagnosis not present

## 2019-05-19 NOTE — Progress Notes (Addendum)
[facility] [place], [place], [place] [address] [phone]  Date:  05/23/2019   Name:  [name]   DOB:  03/29/1931   MRN:  [id number]  PCP:  Darreld Mclean, MD    Chief Complaint: Hypertension (3 month follow up), Hypothyroidism, and Jaw Pain (trouble chewing and opening mouth, couple of months, left jaw pain)   History of Present Illness:  [name] is a 85 y.o. very pleasant female patient who presents with the following:  Here today for a follow-up visit  History of hypothyroidism and HTN I last saw her in May for a virtual visit after hospital stay for hypertensive urgency  She had a cardiology follow-up in June with Dr. Harrell Gave at which time she was doing well: -we started a low dose verapamil, and she is tolerating this very well. She did have another episode of elevated BP shortly after discharge, but since that time she reports normal BP. -no hypotension/side effects reported from verapamil.  -able to exercise with several mild walks daily, no issues -does have mild LVH and mild diastolic dysfunction, evidence that perhaps she has had issues with blood pressure previously that were clinically silent.  Recent BMP ok Can suggest shingrix  Per pt she is to see cardiology for follow-up in about one year She is back to her normal walking routine- she does at least 5 miles a day, sometimes a bit more when the weather is well  She is fasting today for labs   She has noted some pain in her left TMJ for a few months- maybe 6 months or so. It was intermittent at first but now constant the last 2 weeks or so  It hurts to open her mouth wide or to chew, esp tougher foods Lab Results  Component Value Date   TSH 2.191 02/24/2019   She has osteopenia but has declined medication for same so far  She also notes some tender spots on the plantar right foot- ?warts?   Hurt when she finishes her walk but  not during walk Patient Active Problem List   Diagnosis Date Noted  . Essential hypertension 03/21/2019  . CKD (chronic kidney disease), stage II   . Systolic murmur 81/44/8185  . Hypercalcemia   . AKI (acute kidney injury) ([place])   . Dizziness 02/22/2019  . Squamous cell carcinoma in situ of skin 11/30/2018  . Diarrhea 09/08/2017  . Nausea vomiting and diarrhea 09/08/2017  . Elevated serum creatinine 09/08/2017  . PVC (premature ventricular contraction) 09/08/2017  . Glaucoma 09/08/2017  . Hx of meningioma of the brain 08/28/2017  . Hoarseness of voice 02/28/2016  . Raynaud's phenomenon 02/28/2016  . Esophageal spasm 09/20/2013  . Osteoporosis 03/18/2012  . Fracture, shoulder 03/06/2012  . Hyperlipidemia   . Breast cancer [facility])     Past Medical History:  Diagnosis Date  . Autoimmune disease ([place])   . Barrett's esophagus   . Bone lesion    sacrum  . Breast cancer ([place]) 2010   cT1 N0 M0; ER+/ PR+/ HER2 neg; s/p lumpectomy 07/2010; started adjuvant hormonal therapy 10/2009  . Cervical cancer ([place])   . Dysphagia   . Fracture of left shoulder   . Fx distal radius NEC-closed   . Glaucoma   . Humerus fracture 03/2012   impacted left humueral neck fracture, treated by Dr. Amedeo Plenty  . Hyperlipidemia   . Hypertension   . Neuromuscular  disorder ([place])   . Osteopenia   . Osteoporosis   . Osteoporosis 03/18/2012  . Personal history of chemotherapy 2010   Left Breast Cancer  . Personal history of radiation therapy 2010   Left Breast Cancer  . Pneumonia   . Thyroid disease     Past Surgical History:  Procedure Laterality Date  . ABDOMINAL HYSTERECTOMY  1972   cancer          1 tube 1 ovary  . APPENDECTOMY    . BREAST LUMPECTOMY Left 2010  . colon polyp removal    . EYE SURGERY    . HAND SURGERY    . ortho procedures     multiple  . OVARIAN CYST REMOVAL  1978  . SHOULDER SURGERY  1954   left    Social History   Tobacco Use  . Smoking status: Former Smoker    Quit  date: 12/16/1986    Years since quitting: 32.4  . Smokeless tobacco: Never Used  . Tobacco comment: FORMER SMOKER 30 YEARS AGO  Substance Use Topics  . Alcohol use: Yes    Alcohol/week: 0.0 standard drinks    Comment: OCCASIONALLY  . Drug use: No    Family History  Problem Relation Age of Onset  . Heart disease Father   . Cancer Sister        lung, kidney  . Cancer Brother        brain  . Cancer Paternal Aunt        breast  . Breast cancer Paternal Aunt   . Cancer Brother        liver, lung  . Osteoporosis Neg Hx     Allergies  Allergen Reactions  . Daypro [Oxaprozin] Swelling    Face and tongue  . Prolia [Denosumab]     Medication list has been reviewed and updated.  Current Outpatient Medications on File Prior to Visit  Medication Sig Dispense Refill  . Calcium Carbonate-Vitamin D (CALCIUM + D PO) Take 1 capsule by mouth daily.     Marland Kitchen latanoprost (XALATAN) 0.005 % ophthalmic solution Place 1 drop into both eyes at bedtime.     Marland Kitchen levothyroxine (SYNTHROID, LEVOTHROID) 50 MCG tablet Take 1 tablet (50 mcg total) by mouth daily before breakfast. 90 tablet 3  . Multiple Vitamin (MULTIVITAMIN WITH MINERALS) TABS tablet Take 1 tablet by mouth daily.    . rosuvastatin (CRESTOR) 10 MG tablet TAKE 1 BY MOUTH DAILY (Patient taking differently: Take 10 mg by mouth daily. TAKE 1 BY MOUTH DAILY) 90 tablet 3  . verapamil (CALAN-SR) 120 MG CR tablet Take 1 tablet (120 mg total) by mouth daily. 90 tablet 3   No current facility-administered medications on file prior to visit.     Review of Systems:  As per HPI- otherwise negative. No fever or chills She did see her DDS- they did do a couple of fillings for her.  She was not having the TMJ pain at that time  She does check her BP at home on occasion- it has been on   Physical Examination: Vitals:   05/23/19 0847  BP: 126/80  Pulse: 69  Resp: 16  Temp: 98.1 F (36.7 C)  SpO2: 96%   Vitals:   05/23/19 0847  Weight: 108 lb  (49 kg)  Height: 5' 3.5" (1.613 m)   Body mass index is 18.83 kg/m. Ideal Body Weight: Weight in (lb) to have BMI = 25: 143.1  GEN: WDWN, NAD, Non-toxic, A &  O x 3, slim build, looks great for age  52: Atraumatic, Normocephalic. Neck supple. No masses, No LAD.  TM wnl Teeth in good repair, no tenderness when pressing on teeth She does have clicking and tenderness over the left TMJ  Ears and Nose: No external deformity. CV: RRR, No M/G/R. No JVD. No thrill. No extra heart sounds. PULM: CTA B, no wheezes, crackles, rhonchi. No retractions. No resp. distress. No accessory muscle use. EXTR: No c/c/e NEURO Normal gait.  PSYCH: Normally interactive. Conversant. Not depressed or anxious appearing.  Calm demeanor.  Small plantar warts or corns on the ball of the right foot only   Assessment and Plan:   ICD-10-CM   1. Essential hypertension  I10 CBC    Comprehensive metabolic panel  2. Acquired hypothyroidism  E03.9   3. Mixed hyperlipidemia  E78.2 Lipid panel  4. TMJ arthritis  M26.69 DG TMJ Open & Close Unilateral Left  5. Plantar wart of right foot  B07.0    Following up today BP great on current meds Recent TSH ok X-ray of jaw today Corn pads prn for plantar warts to soften skin   Follow-up: No follow-ups on file.  No orders of the defined types were placed in this encounter.  Orders Placed This Encounter  Procedures  . DG TMJ Open & Close Unilateral Left  . CBC  . Comprehensive metabolic panel  . Lipid panel    '@SIGN'$ @    Signed Lamar Blinks, MD   Received her labs and x-ray report 8/11-letter to patient Dg Tmj Open & Close Unilateral Left  Result Date: 05/23/2019 CLINICAL DATA:  TMJ arthritis. Left jaw pain with chewing. EXAM: LEFT UNILATERAL TEMPOROMANDIBULAR JOINTS COMPARISON:  Head CT 02/22/2019. Facial bone radiographs 12/23/2018. FINDINGS: Both mandibular condyles are seated in the glenoid fossa on the closed mouth radiograph and demonstrate expected  anterior translation on the open mouth radiograph. There is asymmetric flattening and cortical irregularity involving the left mandibular condyle corresponding to arthropathy shown on the prior CT. No acute fracture is identified. Moderate to severe disc degeneration is present at C5-6 and C6-7. IMPRESSION: Left TMJ osteoarthrosis without evidence of acute abnormality or dislocation. Electronically Signed   By: Logan Bores M.D.   On: 05/23/2019 14:42   Results for orders placed or performed in visit on 05/23/19  CBC  Result Value Ref Range   WBC 7.1 4.0 - 10.5 K/uL   RBC 4.06 3.87 - 5.11 Mil/uL   Platelets 245.0 150.0 - 400.0 K/uL   Hemoglobin 12.3 12.0 - 15.0 g/dL   HCT 37.1 36.0 - 46.0 %   MCV 91.5 78.0 - 100.0 fl   MCHC 33.1 30.0 - 36.0 g/dL   RDW 15.8 (H) 11.5 - 15.5 %  Comprehensive metabolic panel  Result Value Ref Range   Sodium 137 135 - 145 mEq/L   Potassium 4.6 3.5 - 5.1 mEq/L   Chloride 101 96 - 112 mEq/L   CO2 28 19 - 32 mEq/L   Glucose, Bld 86 70 - 99 mg/dL   BUN 23 6 - 23 mg/dL   Creatinine, Ser 0.98 0.40 - 1.20 mg/dL   Total Bilirubin 0.4 0.2 - 1.2 mg/dL   Alkaline Phosphatase 160 (H) 39 - 117 U/L   AST 33 0 - 37 U/L   ALT 32 0 - 35 U/L   Total Protein 7.2 6.0 - 8.3 g/dL   Albumin 4.2 3.5 - 5.2 g/dL   Calcium 10.2 8.4 - 10.5 mg/dL  GFR 53.80 (L) >60.00 mL/min  Lipid panel  Result Value Ref Range   Cholesterol 169 0 - 200 mg/dL   Triglycerides 50.0 0.0 - 149.0 mg/dL   HDL 78.70 >39.00 mg/dL   VLDL 10.0 0.0 - 40.0 mg/dL   LDL Cholesterol 80 0 - 99 mg/dL   Total CHOL/HDL Ratio 2    NonHDL 89.82

## 2019-05-19 NOTE — Patient Instructions (Addendum)
It was great to see you again today Please be sure to get your flu shot this fall I would also recommend that you consider getting the shingles vaccine series- Shingrix- at your pharmacy   I will be in touch with your labs After your blood draw please go to the ground floor for x-rays

## 2019-05-23 ENCOUNTER — Ambulatory Visit: Payer: Medicare Other | Admitting: Family Medicine

## 2019-05-23 ENCOUNTER — Ambulatory Visit (HOSPITAL_BASED_OUTPATIENT_CLINIC_OR_DEPARTMENT_OTHER)
Admission: RE | Admit: 2019-05-23 | Discharge: 2019-05-23 | Disposition: A | Discharge status: Home / Self Care | Payer: Medicare Other | Source: Ambulatory Visit | Attending: Family Medicine | Admitting: Family Medicine

## 2019-05-23 ENCOUNTER — Other Ambulatory Visit: Payer: Self-pay

## 2019-05-23 ENCOUNTER — Encounter: Payer: Self-pay | Admitting: Family Medicine

## 2019-05-23 VITALS — BP 126/80 | HR 69 | Temp 98.1°F | Resp 16 | Ht 63.5 in | Wt 108.0 lb

## 2019-05-23 DIAGNOSIS — I10 Essential (primary) hypertension: Secondary | ICD-10-CM | POA: Diagnosis not present

## 2019-05-23 DIAGNOSIS — E039 Hypothyroidism, unspecified: Secondary | ICD-10-CM

## 2019-05-23 DIAGNOSIS — M2669 Other specified disorders of temporomandibular joint: Secondary | ICD-10-CM | POA: Insufficient documentation

## 2019-05-23 DIAGNOSIS — E782 Mixed hyperlipidemia: Secondary | ICD-10-CM

## 2019-05-23 DIAGNOSIS — B070 Plantar wart: Secondary | ICD-10-CM

## 2019-05-23 DIAGNOSIS — M26649 Arthritis of unspecified temporomandibular joint: Secondary | ICD-10-CM

## 2019-05-23 DIAGNOSIS — M26602 Left temporomandibular joint disorder, unspecified: Secondary | ICD-10-CM | POA: Diagnosis not present

## 2019-05-23 DIAGNOSIS — R748 Abnormal levels of other serum enzymes: Secondary | ICD-10-CM

## 2019-05-23 LAB — LIPID PANEL
Cholesterol: 169 mg/dL (ref 0–200)
HDL: 78.7 mg/dL (ref >39.00)
LDL Cholesterol: 80 mg/dL (ref 0–99)
NonHDL: 89.82
Total CHOL/HDL Ratio: 2
Triglycerides: 50 mg/dL (ref 0.0–149.0)
VLDL: 10 mg/dL (ref 0.0–40.0)

## 2019-05-23 LAB — COMPREHENSIVE METABOLIC PANEL
ALT: 32 U/L (ref 0–35)
AST: 33 U/L (ref 0–37)
Albumin: 4.2 g/dL (ref 3.5–5.2)
Alkaline Phosphatase: 160 U/L — ABNORMAL HIGH (ref 39–117)
BUN: 23 mg/dL (ref 6–23)
CO2: 28 mEq/L (ref 19–32)
Calcium: 10.2 mg/dL (ref 8.4–10.5)
Chloride: 101 mEq/L (ref 96–112)
Creatinine, Ser: 0.98 mg/dL (ref 0.40–1.20)
GFR: 53.8 mL/min — ABNORMAL LOW (ref >60.00)
Glucose, Bld: 86 mg/dL (ref 70–99)
Potassium: 4.6 mEq/L (ref 3.5–5.1)
Sodium: 137 mEq/L (ref 135–145)
Total Bilirubin: 0.4 mg/dL (ref 0.2–1.2)
Total Protein: 7.2 g/dL (ref 6.0–8.3)

## 2019-05-23 LAB — CBC
HCT: 37.1 % (ref 36.0–46.0)
Hemoglobin: 12.3 g/dL (ref 12.0–15.0)
MCHC: 33.1 g/dL (ref 30.0–36.0)
MCV: 91.5 fl (ref 78.0–100.0)
Platelets: 245 10*3/uL (ref 150.0–400.0)
RBC: 4.06 Mil/uL (ref 3.87–5.11)
RDW: 15.8 % — ABNORMAL HIGH (ref 11.5–15.5)
WBC: 7.1 10*3/uL (ref 4.0–10.5)

## 2019-05-24 NOTE — Addendum Note (Signed)
Addended by: Lamar Blinks C on: 05/24/2019 03:01 PM   Modules accepted: Orders

## 2019-06-08 DIAGNOSIS — D485 Neoplasm of uncertain behavior of skin: Secondary | ICD-10-CM | POA: Diagnosis not present

## 2019-06-08 DIAGNOSIS — L570 Actinic keratosis: Secondary | ICD-10-CM | POA: Diagnosis not present

## 2019-06-08 DIAGNOSIS — L905 Scar conditions and fibrosis of skin: Secondary | ICD-10-CM | POA: Diagnosis not present

## 2019-06-08 DIAGNOSIS — Z85828 Personal history of other malignant neoplasm of skin: Secondary | ICD-10-CM | POA: Diagnosis not present

## 2019-06-08 DIAGNOSIS — C44722 Squamous cell carcinoma of skin of right lower limb, including hip: Secondary | ICD-10-CM | POA: Diagnosis not present

## 2019-06-21 ENCOUNTER — Other Ambulatory Visit: Payer: Self-pay | Admitting: Family Medicine

## 2019-06-21 MED ORDER — VERAPAMIL HCL ER 120 MG PO TBCR: 120.0000 mg | EXTENDED_RELEASE_TABLET | Freq: Every day | ORAL | 2 refills | Status: DC

## 2019-06-21 NOTE — Telephone Encounter (Signed)
Medication Refill - Medication: verapamil (CALAN-SR) 120 MG CR tablet    Preferred Pharmacy (with phone number or street name):  [facility] - [address], [place] [phone] (Phone) [phone] (Fax)     Patient would like 90 day supply.

## 2019-06-23 ENCOUNTER — Other Ambulatory Visit: Payer: Self-pay

## 2019-06-24 ENCOUNTER — Ambulatory Visit (INDEPENDENT_AMBULATORY_CARE_PROVIDER_SITE_OTHER): Payer: Medicare Other | Admitting: Medical

## 2019-06-24 VITALS — BP 151/76 | HR 72 | Temp 97.7°F | Resp 16 | Ht 63.5 in | Wt 109.2 lb

## 2019-06-24 DIAGNOSIS — L089 Local infection of the skin and subcutaneous tissue, unspecified: Secondary | ICD-10-CM

## 2019-06-24 MED ORDER — DOXYCYCLINE HYCLATE 100 MG PO TABS: 100.0000 mg | ORAL_TABLET | Freq: Two times a day (BID) | ORAL | 0 refills | Status: DC

## 2019-06-24 NOTE — Patient Instructions (Signed)
You appear to have area in the scalp that is infected.  You also expressed concern for tick bite.  No tick seen but will prescribe doxycycline antibiotic which provides very good skin coverage as well as tickborne disease coverage.  If the area feels like it is worsening then recommend that you get your husband to inspect the scalp.  If you see more numerous scabbing or any vesicle appearance then would prescribe antiviral for shingles.  No indication for shingles treatment presently.  Also based on your skin cancer history want to recheck your scalp in 10 days.  If the area is not completely healed then would likely refer you to dermatologist for evaluation/biopsy.  Follow-up 10 days or as needed.

## 2019-06-24 NOTE — Progress Notes (Signed)
Subjective:    Patient ID: [name], female    DOB: [birth year], 85 y.o.   MRN: [id number]  HPI  Patient here for tender area on her scalp. She first noticed the spot Wednesday night. She was watching TV and felt a sharp pain on top of her head. When she touched it she noticed it was very tender and felt like a knot. Her daugter looked at it yesterday and said there was a black spot on the area so she was concerned it might be a tick. Patient reports regularly walking at the [facility]. States she always stays on the trails, but has seen a tick on her shirt there before.  At rest area feels like 3/10 stinging sensation, but when touched she reports 8/10 sharp pain. Denies other symptoms including headache, changes in vision, n/v/d, body aches, chills, fever. States feels great overall. She has been walking at least 5 miles a day. However last week, she feels like someone was following her so she has not been back since then. She is considering joining a gym or finding somewhere else to walk.     Review of Systems  Constitutional: Negative for chills, fatigue and fever.  HENT: Negative for ear pain, sinus pressure and sore throat.   Eyes: Negative for visual disturbance.  Respiratory: Negative for chest tightness and shortness of breath.   Cardiovascular: Negative for chest pain.  Musculoskeletal: Negative for arthralgias and myalgias.  Skin: Negative for rash.       Patient reports sore bump to scalp. Extremely tender to touch, throbbing, stinging.  Neurological: Negative for dizziness, light-headedness and headaches.  Psychiatric/Behavioral: Negative for confusion and self-injury. The patient is not nervous/anxious.     Past Medical History:  Diagnosis Date  . Autoimmune disease ([place])   . Barrett's esophagus   . Bone lesion    sacrum  . Breast cancer ([place]) 2010   cT1 N0 M0; ER+/ PR+/ HER2 neg; s/p lumpectomy 07/2010; started adjuvant hormonal therapy 10/2009  .  Cervical cancer ([place])   . Dysphagia   . Fracture of left shoulder   . Fx distal radius NEC-closed   . Glaucoma   . Humerus fracture 03/2012   impacted left humueral neck fracture, treated by Dr. Amedeo Plenty  . Hyperlipidemia   . Hypertension   . Neuromuscular disorder ([place])   . Osteopenia   . Osteoporosis   . Osteoporosis 03/18/2012  . Personal history of chemotherapy 2010   Left Breast Cancer  . Personal history of radiation therapy 2010   Left Breast Cancer  . Pneumonia   . Thyroid disease      Social History   Socioeconomic History  . Marital status: Married    Spouse name: Not on file  . Number of children: 2  . Years of education: 101  . Highest education level: Not on file  Occupational History  . Occupation: retired  Scientific laboratory technician  . Financial resource strain: Not on file  . Food insecurity    Worry: Not on file    Inability: Not on file  . Transportation needs    Medical: Not on file    Non-medical: Not on file  Tobacco Use  . Smoking status: Former Smoker    Quit date: 12/16/1986    Years since quitting: 32.5  . Smokeless tobacco: Never Used  . Tobacco comment: FORMER SMOKER 30 YEARS AGO  Substance and Sexual Activity  . Alcohol use: Yes    Alcohol/week: 0.0 standard  drinks    Comment: OCCASIONALLY  . Drug use: No  . Sexual activity: Not on file  Lifestyle  . Physical activity    Days per week: Not on file    Minutes per session: Not on file  . Stress: Not on file  Relationships  . Social Herbalist on phone: Not on file    Gets together: Not on file    Attends religious service: Not on file    Active member of club or organization: Not on file    Attends meetings of clubs or organizations: Not on file    Relationship status: Not on file  . Intimate partner violence    Fear of current or ex partner: Not on file    Emotionally abused: Not on file    Physically abused: Not on file    Forced sexual activity: Not on file  Other Topics Concern   . Not on file  Social History Narrative   Married   Exercise: Yes   Patient lives with husband in a one story home.  Has 2 daughters.  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  Education: high school.      Past Surgical History:  Procedure Laterality Date  . ABDOMINAL HYSTERECTOMY  1972   cancer          1 tube 1 ovary  . APPENDECTOMY    . BREAST LUMPECTOMY Left 2010  . colon polyp removal    . EYE SURGERY    . HAND SURGERY    . ortho procedures     multiple  . OVARIAN CYST REMOVAL  1978  . SHOULDER SURGERY  1954   left    Family History  Problem Relation Age of Onset  . Heart disease Father   . Cancer Sister        lung, kidney  . Cancer Brother        brain  . Cancer Paternal Aunt        breast  . Breast cancer Paternal Aunt   . Cancer Brother        liver, lung  . Osteoporosis Neg Hx     Allergies  Allergen Reactions  . Daypro [Oxaprozin] Swelling    Face and tongue  . Prolia [Denosumab]     Current Outpatient Medications on File Prior to Visit  Medication Sig Dispense Refill  . Calcium Carbonate-Vitamin D (CALCIUM + D PO) Take 1 capsule by mouth daily.     Marland Kitchen latanoprost (XALATAN) 0.005 % ophthalmic solution Place 1 drop into both eyes at bedtime.     Marland Kitchen levothyroxine (SYNTHROID, LEVOTHROID) 50 MCG tablet Take 1 tablet (50 mcg total) by mouth daily before breakfast. 90 tablet 3  . Multiple Vitamin (MULTIVITAMIN WITH MINERALS) TABS tablet Take 1 tablet by mouth daily.    . rosuvastatin (CRESTOR) 10 MG tablet TAKE 1 BY MOUTH DAILY (Patient taking differently: Take 10 mg by mouth daily. TAKE 1 BY MOUTH DAILY) 90 tablet 3  . verapamil (CALAN-SR) 120 MG CR tablet Take 1 tablet (120 mg total) by mouth daily. 90 tablet 2   No current facility-administered medications on file prior to visit.     BP (!) 151/76   Pulse 72   Temp 97.7 F (36.5 C) (Temporal)   Resp 16   Ht 5' 3.5" (1.613 m)   Wt 109 lb 3.2 oz (49.5 kg)   SpO2 100%   BMI 19.04 kg/m       Objective:  Physical Exam  General- No acute distress. Pleasant patient. Neck- Full range of motion, no jvd Lungs- Clear, even and unlabored. Heart- regular rate and rhythm. Neurologic- CNII- XII grossly intact.  Skin- top of scalp left side. Small 2-3 mm red raised area with tiny scab in center. Under inspection with otoscopy magnification no tick seen. Surrounding area show no vesicles.      Assessment & Plan:  You appear to have area in the scalp that is infected.  You also expressed concern for tick bite.  No tick seen but will prescribe doxycycline antibiotic which provides very good skin coverage as well as tickborne disease coverage.  If the area feels like it is worsening then recommend that you get your husband to inspect the scalp.  If you see more numerous scabbing or any vesicle appearance then would prescribe antiviral for shingles.  No indication for shingles treatment presently.  Also based on your skin cancer history want to recheck your scalp in 10 days.  If the area is not completely healed then would likely refer you to dermatologist for evaluation/biopsy.  Follow-up 10 days or as needed.  Mackie Pai, PA-C

## 2019-06-28 ENCOUNTER — Encounter: Payer: Self-pay | Admitting: Family Medicine

## 2019-06-28 ENCOUNTER — Other Ambulatory Visit: Payer: Self-pay

## 2019-06-28 ENCOUNTER — Other Ambulatory Visit (INDEPENDENT_AMBULATORY_CARE_PROVIDER_SITE_OTHER): Payer: Medicare Other

## 2019-06-28 DIAGNOSIS — R748 Abnormal levels of other serum enzymes: Secondary | ICD-10-CM | POA: Diagnosis not present

## 2019-06-28 DIAGNOSIS — I160 Hypertensive urgency: Secondary | ICD-10-CM | POA: Diagnosis not present

## 2019-06-28 DIAGNOSIS — Z09 Encounter for follow-up examination after completed treatment for conditions other than malignant neoplasm: Secondary | ICD-10-CM | POA: Diagnosis not present

## 2019-06-28 LAB — BASIC METABOLIC PANEL
BUN: 19 mg/dL (ref 6–23)
CO2: 29 mEq/L (ref 19–32)
Calcium: 9.8 mg/dL (ref 8.4–10.5)
Chloride: 103 mEq/L (ref 96–112)
Creatinine, Ser: 0.96 mg/dL (ref 0.40–1.20)
GFR: 55.08 mL/min — ABNORMAL LOW (ref >60.00)
Glucose, Bld: 88 mg/dL (ref 70–99)
Potassium: 4.7 mEq/L (ref 3.5–5.1)
Sodium: 139 mEq/L (ref 135–145)

## 2019-06-28 LAB — CBC
HCT: 39.1 % (ref 36.0–46.0)
Hemoglobin: 12.9 g/dL (ref 12.0–15.0)
MCHC: 32.9 g/dL (ref 30.0–36.0)
MCV: 92 fl (ref 78.0–100.0)
Platelets: 245 10*3/uL (ref 150.0–400.0)
RBC: 4.24 Mil/uL (ref 3.87–5.11)
RDW: 16 % — ABNORMAL HIGH (ref 11.5–15.5)
WBC: 7.6 10*3/uL (ref 4.0–10.5)

## 2019-06-28 LAB — HEPATIC FUNCTION PANEL
ALT: 25 U/L (ref 0–35)
AST: 33 U/L (ref 0–37)
Albumin: 4 g/dL (ref 3.5–5.2)
Alkaline Phosphatase: 120 U/L — ABNORMAL HIGH (ref 39–117)
Bilirubin, Direct: 0.1 mg/dL (ref 0.0–0.3)
Total Bilirubin: 0.4 mg/dL (ref 0.2–1.2)
Total Protein: 6.6 g/dL (ref 6.0–8.3)

## 2019-06-30 NOTE — Progress Notes (Signed)
[place] at [facility] [address] [place], [place] [address] [phone]  Date:  07/04/2019   Name:  [name]   DOB:  10/04/1931   MRN:  [id number]  PCP:  Darreld Mclean, MD    Chief Complaint: Skin Infeciton (scalp, follow up,doing much better)   History of Present Illness:  [name] is a 85 y.o. very pleasant female patient who presents with the following:  Here today for short-term follow-up visit She was seen by Percell Miller on September 11 with concern of a skin infection on her scalp.  He prescribed doxycycline and asked her to see me for a follow-up visit to make sure no sign of skin cancer She finished out the doxy yesterday The area in her scalp feels much better, but she seems to have developed a bit of a scab.  She feels like a scab came off and a new one formed.  Otherwise it is not painful any longer  Otherwise I last saw her in August, after she had been admitted with a hypertensive urgency.  Her blood pressure was looking much better at that time on a low-dose of verapamil  BP Readings from Last 3 Encounters:  07/04/19 (!) 142/88  06/24/19 (!) 151/76  05/23/19 126/80    Flu shot: given today   [name] has enjoyed walking for exercise.  Today she relates to me some concerns about potentially being followed when she is walking.  She tends to go to a couple of different parks, has seen the same group of young man there several times.  She feels as though they are looking at her and possibly trying to frighten her either they have certainly not attacked her in any way.  This is really upsetting to her.  We discussed this today, I suggested that she try walking a different area, perhaps in a neighborhood where their homes around so she would not feel so exposed.  She will think about this  Patient Active Problem List   Diagnosis Date Noted  . Essential hypertension 03/21/2019  . CKD (chronic kidney disease),  stage II   . Systolic murmur 16/10/930  . Hypercalcemia   . AKI (acute kidney injury) ([place])   . Dizziness 02/22/2019  . Squamous cell carcinoma in situ of skin 11/30/2018  . Diarrhea 09/08/2017  . Nausea vomiting and diarrhea 09/08/2017  . Elevated serum creatinine 09/08/2017  . PVC (premature ventricular contraction) 09/08/2017  . Glaucoma 09/08/2017  . Hx of meningioma of the brain 08/28/2017  . Hoarseness of voice 02/28/2016  . Raynaud's phenomenon 02/28/2016  . Esophageal spasm 09/20/2013  . Osteoporosis 03/18/2012  . Fracture, shoulder 03/06/2012  . Hyperlipidemia   . Breast cancer [facility])     Past Medical History:  Diagnosis Date  . Autoimmune disease ([place])   . Barrett's esophagus   . Bone lesion    sacrum  . Breast cancer ([place]) 2010   cT1 N0 M0; ER+/ PR+/ HER2 neg; s/p lumpectomy 07/2010; started adjuvant hormonal therapy 10/2009  . Cervical cancer ([place])   . Dysphagia   . Fracture of left shoulder   . Fx distal radius NEC-closed   . Glaucoma   . Humerus fracture 03/2012   impacted left humueral neck fracture, treated by Dr. Amedeo Plenty  . Hyperlipidemia   . Hypertension   . Neuromuscular disorder ([place])   . Osteopenia   . Osteoporosis   . Osteoporosis 03/18/2012  .  Personal history of chemotherapy 2010   Left Breast Cancer  . Personal history of radiation therapy 2010   Left Breast Cancer  . Pneumonia   . Thyroid disease     Past Surgical History:  Procedure Laterality Date  . ABDOMINAL HYSTERECTOMY  1972   cancer          1 tube 1 ovary  . APPENDECTOMY    . BREAST LUMPECTOMY Left 2010  . colon polyp removal    . EYE SURGERY    . HAND SURGERY    . ortho procedures     multiple  . OVARIAN CYST REMOVAL  1978  . SHOULDER SURGERY  1954   left    Social History   Tobacco Use  . Smoking status: Former Smoker    Quit date: 12/16/1986    Years since quitting: 32.5  . Smokeless tobacco: Never Used  . Tobacco comment: FORMER SMOKER 30 YEARS AGO  Substance Use  Topics  . Alcohol use: Yes    Alcohol/week: 0.0 standard drinks    Comment: OCCASIONALLY  . Drug use: No    Family History  Problem Relation Age of Onset  . Heart disease Father   . Cancer Sister        lung, kidney  . Cancer Brother        brain  . Cancer Paternal Aunt        breast  . Breast cancer Paternal Aunt   . Cancer Brother        liver, lung  . Osteoporosis Neg Hx     Allergies  Allergen Reactions  . Daypro [Oxaprozin] Swelling    Face and tongue  . Prolia [Denosumab]     Medication list has been reviewed and updated.  Current Outpatient Medications on File Prior to Visit  Medication Sig Dispense Refill  . Calcium Carbonate-Vitamin D (CALCIUM + D PO) Take 1 capsule by mouth daily.     Marland Kitchen latanoprost (XALATAN) 0.005 % ophthalmic solution Place 1 drop into both eyes at bedtime.     Marland Kitchen levothyroxine (SYNTHROID, LEVOTHROID) 50 MCG tablet Take 1 tablet (50 mcg total) by mouth daily before breakfast. 90 tablet 3  . Multiple Vitamin (MULTIVITAMIN WITH MINERALS) TABS tablet Take 1 tablet by mouth daily.    . rosuvastatin (CRESTOR) 10 MG tablet TAKE 1 BY MOUTH DAILY (Patient taking differently: Take 10 mg by mouth daily. TAKE 1 BY MOUTH DAILY) 90 tablet 3  . verapamil (CALAN-SR) 120 MG CR tablet Take 1 tablet (120 mg total) by mouth daily. 90 tablet 2   No current facility-administered medications on file prior to visit.     Review of Systems:  As per HPI- otherwise negative.   Physical Examination: Vitals:   07/04/19 1024 07/04/19 1039  BP: (!) 152/70 (!) 142/88  Pulse: (!) 57   Resp: 16   Temp: (!) 97 F (36.1 C)   SpO2: 100%    Vitals:   07/04/19 1024  Weight: 107 lb (48.5 kg)  Height: 5' 3.5" (1.613 m)   Body mass index is 18.66 kg/m. Ideal Body Weight: Weight in (lb) to have BMI = 25: 143.1  GEN: WDWN, NAD, Non-toxic, A & O x 3, petite build, looks well HEENT: Atraumatic, Normocephalic. Neck supple. No masses, No LAD. Ears and Nose: No external  deformity. CV: RRR, No M/G/R. No JVD. No thrill. No extra heart sounds. PULM: CTA B, no wheezes, crackles, rhonchi. No retractions. No resp. distress. No accessory muscle  use. EXTR: No c/c/e NEURO Normal gait.  PSYCH: Normally interactive. Conversant. Not depressed or anxious appearing.  Calm demeanor.  There is a tiny scab on the crown of her head.  The patient relates this was the site of her recent skin issue.  It does appear to be healing, does not appear infected Repeat blood pressure improved  Assessment and Plan: Abrasion of scalp with infection, subsequent encounter  Needs flu shot - Plan: Flu Vaccine QUAD High Dose(Fluad)  Essential hypertension  Anxiety  Following up today on recent scalp infection.  She is completed a course of doxycycline.  I did not see her scalp 10 days ago, but at this point she appears to have a benign healing skin lesion.  However I have cautioned her that if this area does not heal over completely it could be concerning for skin cancer.  If she continues to have scabbing, poor healing, or any other concerns she will let me know and I can refer to dermatology  Flu shot given Blood pressure improved on recheck Listened to her concerns about possibly being followed, and discussed a plan for how to handle her anxiety regarding walking.  I suggested that she is very her walking routine, walking in different places and at different times.  She also might feel safer walking in a neighborhood where there are houses around. She might carry pepper spray.  She will take this under advisement and states that she feels better having told someone about her concerns   Signed Lamar Blinks, MD

## 2019-07-04 ENCOUNTER — Encounter: Payer: Self-pay | Admitting: Family Medicine

## 2019-07-04 ENCOUNTER — Other Ambulatory Visit: Payer: Self-pay

## 2019-07-04 ENCOUNTER — Ambulatory Visit: Payer: Medicare Other | Admitting: Family Medicine

## 2019-07-04 VITALS — BP 142/88 | HR 57 | Temp 97.0°F | Resp 16 | Ht 63.5 in | Wt 107.0 lb

## 2019-07-04 DIAGNOSIS — I10 Essential (primary) hypertension: Secondary | ICD-10-CM

## 2019-07-04 DIAGNOSIS — F419 Anxiety disorder, unspecified: Secondary | ICD-10-CM

## 2019-07-04 DIAGNOSIS — Z23 Encounter for immunization: Secondary | ICD-10-CM | POA: Diagnosis not present

## 2019-07-04 DIAGNOSIS — L089 Local infection of the skin and subcutaneous tissue, unspecified: Secondary | ICD-10-CM

## 2019-07-04 DIAGNOSIS — S0001XD Abrasion of scalp, subsequent encounter: Secondary | ICD-10-CM | POA: Diagnosis not present

## 2019-07-04 NOTE — Patient Instructions (Addendum)
Great to see you again today- please let me know if the little spot on your scalp does not clear up entirely Flu shot given today  I would recommend that you perhaps try walking in a neighborhood if you are worried about people following you Working out a gym is a good idea as well

## 2019-07-18 ENCOUNTER — Other Ambulatory Visit: Payer: Self-pay | Admitting: Family Medicine

## 2019-07-18 DIAGNOSIS — Z1231 Encounter for screening mammogram for malignant neoplasm of breast: Secondary | ICD-10-CM

## 2019-07-27 DIAGNOSIS — L570 Actinic keratosis: Secondary | ICD-10-CM | POA: Diagnosis not present

## 2019-07-27 DIAGNOSIS — C44311 Basal cell carcinoma of skin of nose: Secondary | ICD-10-CM | POA: Diagnosis not present

## 2019-07-27 DIAGNOSIS — L821 Other seborrheic keratosis: Secondary | ICD-10-CM | POA: Diagnosis not present

## 2019-07-27 DIAGNOSIS — D485 Neoplasm of uncertain behavior of skin: Secondary | ICD-10-CM | POA: Diagnosis not present

## 2019-07-27 DIAGNOSIS — Z85828 Personal history of other malignant neoplasm of skin: Secondary | ICD-10-CM | POA: Diagnosis not present

## 2019-08-22 DIAGNOSIS — C4401 Basal cell carcinoma of skin of lip: Secondary | ICD-10-CM | POA: Diagnosis not present

## 2019-08-22 DIAGNOSIS — Z85828 Personal history of other malignant neoplasm of skin: Secondary | ICD-10-CM | POA: Diagnosis not present

## 2019-08-31 ENCOUNTER — Ambulatory Visit
Admission: RE | Admit: 2019-08-31 | Discharge: 2019-08-31 | Disposition: A | Discharge status: Home / Self Care | Payer: Medicare Other | Source: Ambulatory Visit | Attending: Family Medicine | Admitting: Family Medicine

## 2019-08-31 ENCOUNTER — Other Ambulatory Visit: Payer: Self-pay

## 2019-08-31 DIAGNOSIS — Z1231 Encounter for screening mammogram for malignant neoplasm of breast: Secondary | ICD-10-CM | POA: Diagnosis not present

## 2019-09-03 ENCOUNTER — Other Ambulatory Visit: Payer: Self-pay | Admitting: Family Medicine

## 2019-09-03 DIAGNOSIS — E039 Hypothyroidism, unspecified: Secondary | ICD-10-CM

## 2019-09-05 NOTE — Telephone Encounter (Signed)
Pt called in stating she is checking on status of refill. Please advise.

## 2019-09-15 DIAGNOSIS — H401132 Primary open-angle glaucoma, bilateral, moderate stage: Secondary | ICD-10-CM | POA: Diagnosis not present

## 2019-09-15 DIAGNOSIS — H02821 Cysts of right upper eyelid: Secondary | ICD-10-CM | POA: Diagnosis not present

## 2019-09-15 DIAGNOSIS — H02822 Cysts of right lower eyelid: Secondary | ICD-10-CM | POA: Diagnosis not present

## 2019-09-15 DIAGNOSIS — Z961 Presence of intraocular lens: Secondary | ICD-10-CM | POA: Diagnosis not present

## 2019-09-28 ENCOUNTER — Telehealth: Payer: Self-pay | Admitting: *Deleted

## 2019-09-28 NOTE — Telephone Encounter (Signed)
Copied from [place] [id number]. Topic: General - Other >> Sep 27, 2019  3:44 PM [name] wrote: Reason for CRM: Patient's daughter called to inform the nurse or doctor that her mom is having some anxiety really bad and would like to talk to the doctor about.  CB# [phone]

## 2019-09-28 NOTE — Telephone Encounter (Signed)
LM for pt to call back and schedule appt

## 2019-09-28 NOTE — Telephone Encounter (Signed)
Could we get this patient in for an appointment to see dr. Lorelei Pont can be virtual if preferred.

## 2019-09-29 ENCOUNTER — Encounter: Payer: Self-pay | Admitting: Family Medicine

## 2019-09-29 ENCOUNTER — Other Ambulatory Visit: Payer: Self-pay

## 2019-09-29 ENCOUNTER — Ambulatory Visit (INDEPENDENT_AMBULATORY_CARE_PROVIDER_SITE_OTHER): Payer: Medicare Other | Admitting: Family Medicine

## 2019-09-29 ENCOUNTER — Encounter (INDEPENDENT_AMBULATORY_CARE_PROVIDER_SITE_OTHER): Payer: Self-pay

## 2019-09-29 VITALS — BP 132/80 | HR 52 | Temp 96.0°F | Resp 17 | Ht 63.5 in | Wt 111.0 lb

## 2019-09-29 DIAGNOSIS — E039 Hypothyroidism, unspecified: Secondary | ICD-10-CM | POA: Diagnosis not present

## 2019-09-29 DIAGNOSIS — F22 Delusional disorders: Secondary | ICD-10-CM | POA: Diagnosis not present

## 2019-09-29 DIAGNOSIS — I10 Essential (primary) hypertension: Secondary | ICD-10-CM | POA: Diagnosis not present

## 2019-09-29 LAB — COMPREHENSIVE METABOLIC PANEL
ALT: 20 U/L (ref 0–35)
AST: 33 U/L (ref 0–37)
Albumin: 4.2 g/dL (ref 3.5–5.2)
Alkaline Phosphatase: 82 U/L (ref 39–117)
BUN: 19 mg/dL (ref 6–23)
CO2: 29 mEq/L (ref 19–32)
Calcium: 10.1 mg/dL (ref 8.4–10.5)
Chloride: 103 mEq/L (ref 96–112)
Creatinine, Ser: 1.06 mg/dL (ref 0.40–1.20)
GFR: 49.1 mL/min — ABNORMAL LOW (ref >60.00)
Glucose, Bld: 95 mg/dL (ref 70–99)
Potassium: 4.6 mEq/L (ref 3.5–5.1)
Sodium: 138 mEq/L (ref 135–145)
Total Bilirubin: 0.4 mg/dL (ref 0.2–1.2)
Total Protein: 7.3 g/dL (ref 6.0–8.3)

## 2019-09-29 LAB — TSH: TSH: 4.26 u[IU]/mL (ref 0.35–4.50)

## 2019-09-29 LAB — CBC
HCT: 40.8 % (ref 36.0–46.0)
Hemoglobin: 13.4 g/dL (ref 12.0–15.0)
MCHC: 32.8 g/dL (ref 30.0–36.0)
MCV: 92.5 fl (ref 78.0–100.0)
Platelets: 252 10*3/uL (ref 150.0–400.0)
RBC: 4.41 Mil/uL (ref 3.87–5.11)
RDW: 15.6 % — ABNORMAL HIGH (ref 11.5–15.5)
WBC: 8 10*3/uL (ref 4.0–10.5)

## 2019-09-29 MED ORDER — CITALOPRAM HYDROBROMIDE 10 MG PO TABS: 10.0000 mg | ORAL_TABLET | Freq: Every day | ORAL | 3 refills | Status: DC

## 2019-09-29 NOTE — Patient Instructions (Addendum)
It was good to see you again today!    I will be in touch with your blood work and urine culture asap  We are going to set you up to see a neurologist to discuss some of your worries and other symptoms.  In the meantime I would like for you to try celexa 10 mg daily to see if this may help  Please let me know if you are getting worse or have other concerns!

## 2019-09-29 NOTE — Progress Notes (Signed)
[place] at [facility] [place], [place], [place] [address] [phone]  Date:  09/29/2019   Name:  [name]   DOB:  01/24/1931   MRN:  [id number]  PCP:  Darreld Mclean, MD    Chief Complaint: Paranoid   History of Present Illness:  [name] is a 85 y.o. very pleasant female patient who presents with the following:  Older patient with history of hypertension, mild kidney disease, hyperlipidemia, breast cancer  Here today with concern of anxiety Over the summer she was admitted with a hypertensive urgency-now taking a low-dose of verapamil and doing better  Here today with her daughter [name] who contributes to the history   Flu vaccine is up-to-date Labs up-to-date  It seems that [name] has developed some paranoia over the last several months.  At her last visit in September, she confided in me that she felt as though people might be following her when she was exercising at a local park.  This seemed as though it may have actually happened, but she now describes multiple instances in which she feels a group of people (a "gang") are observing her, following her, and mean her harm.  She feels that these persons have followed her to several parks, a restaurant, and also her church She also feels that a car parked outside of her house not long ago was there to watch her She would like to get outside and walk, but she is feeling afraid "that my life is in danger"  She has tried walking in a new place, but has continued to feel like people have followed her to 3 walking places and a restaurant She feels like people are following her/ staring at her  Her daughter [name] notes that this type of paranoia is out of character for her- she does not feel like her mom has memory loss, but that her mom feels that people are watching and stalking her She also notes that her mom feels convinced that she knows these people's  intentions, that she is able to "read their minds" and know that they are trying to menace  She has had a couple of attacks of severe anxiety/ panic attacks Otherwise her mind has seemed to be ok- she continues to live with her husband in their own home, drives, pays their bills, etc Married to [name] They have 2 daughters Physically she is feeling well  BP Readings from Last 3 Encounters:  09/29/19 132/80  07/04/19 (!) 142/88  06/24/19 (!) 151/76   Wt Readings from Last 3 Encounters:  09/29/19 111 lb (50.3 kg)  07/04/19 107 lb (48.5 kg)  06/24/19 109 lb 3.2 oz (49.5 kg)   Lab Results  Component Value Date   TSH 2.191 02/24/2019    Patient Active Problem List   Diagnosis Date Noted  . Essential hypertension 03/21/2019  . CKD (chronic kidney disease), stage II   . Systolic murmur 12/87/8676  . Hypercalcemia   . AKI (acute kidney injury) ([place])   . Dizziness 02/22/2019  . Squamous cell carcinoma in situ of skin 11/30/2018  . Diarrhea 09/08/2017  . Nausea vomiting and diarrhea 09/08/2017  . Elevated serum creatinine 09/08/2017  . PVC (premature ventricular contraction) 09/08/2017  . Glaucoma 09/08/2017  . Hx of meningioma of the brain 08/28/2017  . Hoarseness of voice 02/28/2016  . Raynaud's phenomenon 02/28/2016  . Esophageal spasm 09/20/2013  . Osteoporosis 03/18/2012  .  Fracture, shoulder 03/06/2012  . Hyperlipidemia   . Breast cancer [facility])     Past Medical History:  Diagnosis Date  . Autoimmune disease ([place])   . Barrett's esophagus   . Bone lesion    sacrum  . Breast cancer ([facility]) 2010   cT1 N0 M0; ER+/ PR+/ HER2 neg; s/p lumpectomy 07/2010; started adjuvant hormonal therapy 10/2009  . Cervical cancer ([place])   . Dysphagia   . Fracture of left shoulder   . Fx distal radius NEC-closed   . Glaucoma   . Humerus fracture 03/2012   impacted left humueral neck fracture, treated by Dr. Amedeo Plenty  . Hyperlipidemia   . Hypertension   . Neuromuscular disorder ([place])   .  Osteopenia   . Osteoporosis   . Osteoporosis 03/18/2012  . Personal history of chemotherapy 2010   Left Breast Cancer  . Personal history of radiation therapy 2010   Left Breast Cancer  . Pneumonia   . Thyroid disease     Past Surgical History:  Procedure Laterality Date  . ABDOMINAL HYSTERECTOMY  1972   cancer          1 tube 1 ovary  . APPENDECTOMY    . BREAST LUMPECTOMY Left 2010  . colon polyp removal    . EYE SURGERY    . HAND SURGERY    . ortho procedures     multiple  . OVARIAN CYST REMOVAL  1978  . SHOULDER SURGERY  1954   left    Social History   Tobacco Use  . Smoking status: Former Smoker    Quit date: 12/16/1986    Years since quitting: 32.8  . Smokeless tobacco: Never Used  . Tobacco comment: FORMER SMOKER 30 YEARS AGO  Substance Use Topics  . Alcohol use: Yes    Alcohol/week: 0.0 standard drinks    Comment: OCCASIONALLY  . Drug use: No    Family History  Problem Relation Age of Onset  . Heart disease Father   . Cancer Sister        lung, kidney  . Cancer Brother        brain  . Cancer Paternal Aunt        breast  . Breast cancer Paternal Aunt   . Cancer Brother        liver, lung  . Osteoporosis Neg Hx     Allergies  Allergen Reactions  . Daypro [Oxaprozin] Swelling    Face and tongue  . Prolia [Denosumab]     Medication list has been reviewed and updated.  Current Outpatient Medications on File Prior to Visit  Medication Sig Dispense Refill  . Calcium Carbonate-Vitamin D (CALCIUM + D PO) Take 1 capsule by mouth daily.     Marland Kitchen latanoprost (XALATAN) 0.005 % ophthalmic solution Place 1 drop into both eyes at bedtime.     Marland Kitchen levothyroxine (SYNTHROID) 50 MCG tablet TAKE 1 TABLET BY MOUTH DAILY BEFORE BREAKFAST 90 tablet 3  . Multiple Vitamin (MULTIVITAMIN WITH MINERALS) TABS tablet Take 1 tablet by mouth daily.    . rosuvastatin (CRESTOR) 10 MG tablet TAKE 1 BY MOUTH DAILY (Patient taking differently: Take 10 mg by mouth daily. TAKE 1 BY  MOUTH DAILY) 90 tablet 3  . verapamil (CALAN-SR) 120 MG CR tablet Take 1 tablet (120 mg total) by mouth daily. 90 tablet 2   No current facility-administered medications on file prior to visit.    Review of Systems:  As per HPI- otherwise negative.  Physical Examination: Vitals:   09/29/19 0809  BP: 132/80  Pulse: (!) 52  Resp: 17  Temp: (!) 96 F (35.6 C)  SpO2: 100%   Vitals:   09/29/19 0809  Weight: 111 lb (50.3 kg)  Height: 5' 3.5" (1.613 m)   Body mass index is 19.35 kg/m. Ideal Body Weight: Weight in (lb) to have BMI = 25: 143.1  GEN: WDWN, NAD, Non-toxic, A & O x 3, petite build, looks well HEENT: Atraumatic, Normocephalic. Neck supple. No masses, No LAD. Ears and Nose: No external deformity. CV: RRR, No M/G/R. No JVD. No thrill. No extra heart sounds. PULM: CTA B, no wheezes, crackles, rhonchi. No retractions. No resp. distress. No accessory muscle use. ABD: S, NT, ND. No rebound. No HSM. EXTR: No c/c/e NEURO Normal gait.  PSYCH: Normally interactive. Conversant.  Mildly anxious today  Pulse Readings from Last 3 Encounters:  09/29/19 (!) 52  07/04/19 (!) 57  06/24/19 72    Assessment and Plan: Paranoia ([place]) - Plan: Urine culture, citalopram (CELEXA) 10 MG tablet, Ambulatory referral to Neurology  Essential hypertension - Plan: CBC, Comprehensive metabolic panel  Acquired hypothyroidism - Plan: TSH  Older woman here today accompanied by her daughter, with concern of paranoia over the last approximately 4 to 5 months.  Otherwise [place] has not shown any signs of dementia.  I discussed the situation in detail with patient and her daughter.  I do not think this is mental illness, given late onset of symptoms.  Likely this is part of aging brain/early dementia.  [place] is motivated to decrease the symptoms, she really enjoys walking and does not like feeling afraid.  She is able to accept the idea that her mind may be playing tricks on her, though she does  still somewhat suspect that a gang is following her.  Well we admit that this is possible, it seems more likely that she has developed paranoia symptoms- likely a neuropsychiatric side effect of early dementia  I referred her to neurology for consultation In the meantime labs are pending as above to rule out any potential physical cause Will start on Celexa 10 mg in hopes of decreasing her symptoms  I have asked her to keep me posted and let me know if I can do anything else to help  This visit occurred during the SARS-CoV-2 public health emergency.  Safety protocols were in place, including screening questions prior to the visit, additional usage of staff PPE, and extensive cleaning of exam room while observing appropriate contact time as indicated for disinfecting solutions.    Signed Lamar Blinks, MD  Received her labs so far, message to patient  Results for orders placed or performed in visit on 09/29/19  CBC  Result Value Ref Range   WBC 8.0 4.0 - 10.5 K/uL   RBC 4.41 3.87 - 5.11 Mil/uL   Platelets 252.0 150.0 - 400.0 K/uL   Hemoglobin 13.4 12.0 - 15.0 g/dL   HCT 40.8 36.0 - 46.0 %   MCV 92.5 78.0 - 100.0 fl   MCHC 32.8 30.0 - 36.0 g/dL   RDW 15.6 (H) 11.5 - 15.5 %  Comprehensive metabolic panel  Result Value Ref Range   Sodium 138 135 - 145 mEq/L   Potassium 4.6 3.5 - 5.1 mEq/L   Chloride 103 96 - 112 mEq/L   CO2 29 19 - 32 mEq/L   Glucose, Bld 95 70 - 99 mg/dL   BUN 19 6 - 23 mg/dL   Creatinine,  Ser 1.06 0.40 - 1.20 mg/dL   Total Bilirubin 0.4 0.2 - 1.2 mg/dL   Alkaline Phosphatase 82 39 - 117 U/L   AST 33 0 - 37 U/L   ALT 20 0 - 35 U/L   Total Protein 7.3 6.0 - 8.3 g/dL   Albumin 4.2 3.5 - 5.2 g/dL   GFR 49.10 (L) >60.00 mL/min   Calcium 10.1 8.4 - 10.5 mg/dL  TSH  Result Value Ref Range   TSH 4.26 0.35 - 4.50 uIU/mL   Urine culture still pending

## 2019-09-30 LAB — URINE CULTURE
MICRO NUMBER:: 1208557
Result:: NO GROWTH
SPECIMEN QUALITY:: ADEQUATE

## 2019-10-10 ENCOUNTER — Telehealth: Payer: Self-pay

## 2019-10-10 NOTE — Telephone Encounter (Signed)
Copied from [place] [id number]. Topic: General - Other >> Oct 05, 2019  9:24 AM Jodie Echevaria wrote: Reason for CRM: Antony Madura with [facility] called to inquire about a referral that was received in their office. She stated that it showed that patient saw Dr Posey Pronto at [facility] back in January of 2019 and was wondering if this referral was sent to them in error. Please advise Antony Madura can be reached at Ph# [phone]

## 2019-10-17 NOTE — Telephone Encounter (Signed)
Antony Madura is calling again to check on this.

## 2019-10-18 NOTE — Telephone Encounter (Signed)
Was the referral placed in error? She should already be established at that office as she was seen in 2019

## 2019-10-18 NOTE — Telephone Encounter (Signed)
Placed referral as this is a new issue- I had thought they often required a referral for new problem.  Called [place] and spoke with her to clarify- we would like to proceed with referral

## 2019-10-19 ENCOUNTER — Other Ambulatory Visit: Payer: Self-pay

## 2019-10-19 ENCOUNTER — Emergency Department (HOSPITAL_BASED_OUTPATIENT_CLINIC_OR_DEPARTMENT_OTHER)
Admission: EM | Admit: 2019-10-19 | Discharge: 2019-10-19 | Discharge status: Against Medical Advice | Payer: Medicare Other

## 2019-10-19 ENCOUNTER — Encounter: Payer: Self-pay | Admitting: Family Medicine

## 2019-10-22 DIAGNOSIS — Z20828 Contact with and (suspected) exposure to other viral communicable diseases: Secondary | ICD-10-CM | POA: Diagnosis not present

## 2019-10-24 ENCOUNTER — Encounter: Payer: Self-pay | Admitting: Family Medicine

## 2019-10-24 ENCOUNTER — Ambulatory Visit (INDEPENDENT_AMBULATORY_CARE_PROVIDER_SITE_OTHER): Payer: Medicare Other | Admitting: Family Medicine

## 2019-10-24 DIAGNOSIS — U071 COVID-19: Secondary | ICD-10-CM | POA: Diagnosis not present

## 2019-10-24 DIAGNOSIS — T68XXXA Hypothermia, initial encounter: Secondary | ICD-10-CM

## 2019-10-24 NOTE — Telephone Encounter (Signed)
Called and spoke to [place] They checked her temp- her thermometer was 94, other thermometer 95.6 and then 96 degrees.  Per [name], her mom was looking really quite well  We discussed her situation in detail.  Certainly low temperatures could be a sign of a more serious illness.  However, I suspect that her thermometer was not accurate since her temperature has been more normal using her daughter's thermometer.  A temperature of 96 is okay If she does start running 93 or 94 again, I would recommend taking her to the ER right away.  We discussed taking her to the ER now, at this time the family feels this would like to be more traumatic and possibly dangerous than helpful.  I agree that she is likely better off at home during this pandemic  I gave [name] my cell phone number and asked her to call me if she needs anything

## 2019-10-24 NOTE — Progress Notes (Signed)
[place] [place], [place], [place] [address] [phone]  Date:  10/24/2019   Name:  CIARRA BRADDY   DOB:  01/25/1931   MRN:  [id number]  PCP:  Darreld Mclean, MD    Chief Complaint: tested positive fir COVID and Cough (taking robitussin for her cough)   History of Present Illness:  BREZLYN MANRIQUE is a 86 y.o. very pleasant female patient who presents with the following:  Virtual visit today for concern of COVID-19 infection I saw this patient most recently in mid December along with her daughter [name] At that time we were concerned that Beola was having some paranoia; she had complaint of various people following her watching her-it seemed unlikely that she was actually being stalked This anxiety/paranoia had caused her to discontinue her walking program, she had always enjoyed walking fairly long distances for exercise We started her on Celexa, and referred her to neurology at our last visit  Connected on the phone with [name] her daughter Pt recently positive for covid 19- her test was done this past Saturday 10/22/19 Per [name] she started coughing in mid December She has felt congested at night especially Her other daughter [name] has also been ill- her covid test is pending We are concerned that her husband is ill as well- he has refused to get tested, Erlene Quan notes that he is still going out such as to [employer] called her mother Dortha, and all 3 of [place] were able to connect on the phone Danean is identified by 2 factors, she gives consent for virtual visit today.  Janisha is at home and I am at my office  Magaby notes that she is hoarse and has to clear her throat off and on She took her temp earlier today and it was 93, then 94.5 just now  Her temp has been 94- 95 over the last week when she checks it at home This is a new thermometer, she thinks it is working okay.  It is an oral thermometer  She  notes cough but not severe No chills or body aches  No abd pain She is not checking her BP or pulse at home  BP 145/74 when it was checked most recently, per her report  Overall Domonique feels well per her report, she is really not concerned at this time   Patient Active Problem List   Diagnosis Date Noted  . Essential hypertension 03/21/2019  . CKD (chronic kidney disease), stage II   . Systolic murmur 50/35/4656  . Hypercalcemia   . AKI (acute kidney injury) ([place])   . Dizziness 02/22/2019  . Squamous cell carcinoma in situ of skin 11/30/2018  . Diarrhea 09/08/2017  . Nausea vomiting and diarrhea 09/08/2017  . Elevated serum creatinine 09/08/2017  . PVC (premature ventricular contraction) 09/08/2017  . Glaucoma 09/08/2017  . Hx of meningioma of the brain 08/28/2017  . Hoarseness of voice 02/28/2016  . Raynaud's phenomenon 02/28/2016  . Esophageal spasm 09/20/2013  . Osteoporosis 03/18/2012  . Fracture, shoulder 03/06/2012  . Hyperlipidemia   . Breast cancer [facility])     Past Medical History:  Diagnosis Date  . Autoimmune disease ([place])   . Barrett's esophagus   . Bone lesion    sacrum  . Breast cancer ([place]) 2010   cT1 N0 M0; ER+/ PR+/ HER2 neg; s/p lumpectomy 07/2010; started adjuvant hormonal therapy 10/2009  . Cervical cancer ([place])   .  Dysphagia   . Fracture of left shoulder   . Fx distal radius NEC-closed   . Glaucoma   . Humerus fracture 03/2012   impacted left humueral neck fracture, treated by Dr. Amedeo Plenty  . Hyperlipidemia   . Hypertension   . Neuromuscular disorder ([place])   . Osteopenia   . Osteoporosis   . Osteoporosis 03/18/2012  . Personal history of chemotherapy 2010   Left Breast Cancer  . Personal history of radiation therapy 2010   Left Breast Cancer  . Pneumonia   . Thyroid disease     Past Surgical History:  Procedure Laterality Date  . ABDOMINAL HYSTERECTOMY  1972   cancer          1 tube 1 ovary  . APPENDECTOMY    . BREAST LUMPECTOMY Left  2010  . colon polyp removal    . EYE SURGERY    . HAND SURGERY    . ortho procedures     multiple  . OVARIAN CYST REMOVAL  1978  . SHOULDER SURGERY  1954   left    Social History   Tobacco Use  . Smoking status: Former Smoker    Quit date: 12/16/1986    Years since quitting: 32.8  . Smokeless tobacco: Never Used  . Tobacco comment: FORMER SMOKER 30 YEARS AGO  Substance Use Topics  . Alcohol use: Yes    Alcohol/week: 0.0 standard drinks    Comment: OCCASIONALLY  . Drug use: No    Family History  Problem Relation Age of Onset  . Heart disease Father   . Cancer Sister        lung, kidney  . Cancer Brother        brain  . Cancer Paternal Aunt        breast  . Breast cancer Paternal Aunt   . Cancer Brother        liver, lung  . Osteoporosis Neg Hx     Allergies  Allergen Reactions  . Daypro [Oxaprozin] Swelling    Face and tongue  . Prolia [Denosumab]     Medication list has been reviewed and updated.  Current Outpatient Medications on File Prior to Visit  Medication Sig Dispense Refill  . Calcium Carbonate-Vitamin D (CALCIUM + D PO) Take 1 capsule by mouth daily.     . citalopram (CELEXA) 10 MG tablet Take 1 tablet (10 mg total) by mouth daily. 90 tablet 3  . latanoprost (XALATAN) 0.005 % ophthalmic solution Place 1 drop into both eyes at bedtime.     Marland Kitchen levothyroxine (SYNTHROID) 50 MCG tablet TAKE 1 TABLET BY MOUTH DAILY BEFORE BREAKFAST 90 tablet 3  . Multiple Vitamin (MULTIVITAMIN WITH MINERALS) TABS tablet Take 1 tablet by mouth daily.    . rosuvastatin (CRESTOR) 10 MG tablet TAKE 1 BY MOUTH DAILY (Patient taking differently: Take 10 mg by mouth daily. TAKE 1 BY MOUTH DAILY) 90 tablet 3  . verapamil (CALAN-SR) 120 MG CR tablet Take 1 tablet (120 mg total) by mouth daily. 90 tablet 2   No current facility-administered medications on file prior to visit.    Review of Systems:  As per HPI- otherwise negative.   Physical Examination: There were no vitals  filed for this visit. There were no vitals filed for this visit. There is no height or weight on file to calculate BMI. Ideal Body Weight:    Spoke with patient on the telephone.  She sounds well, no cough, wheezing, shortness of breath is noted.  She sounds her normal self Spoke for 28 minutes  Assessment and Plan: COVID-19 virus infection  Hypothermia, initial encounter  Lacresha was recently diagnosed with COVID-19, tested +2 days ago She generally feels okay, her daughter she wanted [place] to check on her mostly due to low temperatures. She has checked her temperature with an electronic oral thermometer at home, and seems to be getting low readings as low as 93 to 25 F We are not sure if these temperature readings are accurate Discussed in detail with patient and her daughter, we made a plan for one of her daughters to go to her home with a second thermometer and confirm her temperature If her temperature is indeed this low, it may be a sign of a more significant infection or sepsis.  In this case I would encourage her to be seen in the emergency room Recommended quarantine for 10 to 14 days Moderate medical decision making used today Signed Lamar Blinks, MD  Per associated telephone note from later today Called and spoke to [place] They checked her temp- pt's thermometer read 94, other thermometer 95.6 and then 96 degrees.  Per [name], her mom was looking really quite well  We discussed her situation in detail.  Certainly low temperatures could be a sign of a more serious illness.  However, I suspect that her thermometer was not accurate since her temperature has been more normal using her daughter's thermometer.  A temperature of 96 is okay If she does start running 93 or 94 again, I would recommend taking her to the ER right away.  We discussed taking her to the ER now, at this time the family feels this would like to be more traumatic and possibly dangerous than helpful.  I agree  that she is likely better off at home during this pandemic  I gave [name] my cell phone number and asked her to call me if she needs anything

## 2019-11-11 NOTE — Progress Notes (Signed)
Virtual Visit via Audio Note  I connected with patient on 11/14/19 at  2:00 PM EST by audio enabled telemedicine application and verified that I am speaking with the correct person using two identifiers.   THIS ENCOUNTER IS A VIRTUAL VISIT DUE TO COVID-19 - PATIENT WAS NOT SEEN IN THE OFFICE. PATIENT HAS CONSENTED TO VIRTUAL VISIT / TELEMEDICINE VISIT   Location of patient: home  Location of provider: office  I discussed the limitations of evaluation and management by telemedicine and the availability of in person appointments. The patient expressed understanding and agreed to proceed.   Subjective:   [name] is a 86 y.o. female who presents for Medicare Annual (Subsequent) preventive examination.  Pt reports health as really good!  Review of Systems:  Home Safety/Smoke Alarms: Feels safe in home. Smoke alarms in place.  Lives with husband in 1 story home. Stairs with rails at entrance. Does well with stairs. Walk in shower.   Female:      Mammo-  08/31/19     Dexa scan-09/13/18           Objective:     Vitals: BP 127/70 Comment: pt reported    Advanced Directives 11/14/2019 03/01/2019 02/23/2019 02/22/2019 12/20/2018 11/11/2018 09/08/2017  Does Patient Have a Medical Advance Directive? No No - No No No No  Would patient like information on creating a medical advance directive? No - Patient declined No - Patient declined No - Patient declined - - No - Patient declined No - Patient declined    Tobacco Social History   Tobacco Use  Smoking Status Former Smoker  . Quit date: 12/16/1986  . Years since quitting: 32.9  Smokeless Tobacco Never Used  Tobacco Comment   FORMER SMOKER 30 YEARS AGO     Counseling given: Not Answered Comment: FORMER SMOKER 30 YEARS AGO   Clinical Intake: Pain : No/denies pain     Past Medical History:  Diagnosis Date  . Autoimmune disease ([place])   . Barrett's esophagus   . Bone lesion    sacrum  . Breast cancer ([place]) 2010   cT1 N0 M0;  ER+/ PR+/ HER2 neg; s/p lumpectomy 07/2010; started adjuvant hormonal therapy 10/2009  . Cervical cancer ([place])   . Dysphagia   . Fracture of left shoulder   . Fx distal radius NEC-closed   . Glaucoma   . Humerus fracture 03/2012   impacted left humueral neck fracture, treated by Dr. Amedeo Plenty  . Hyperlipidemia   . Hypertension   . Neuromuscular disorder ([place])   . Osteopenia   . Osteoporosis   . Osteoporosis 03/18/2012  . Personal history of chemotherapy 2010   Left Breast Cancer  . Personal history of radiation therapy 2010   Left Breast Cancer  . Pneumonia   . Thyroid disease    Past Surgical History:  Procedure Laterality Date  . ABDOMINAL HYSTERECTOMY  1972   cancer          1 tube 1 ovary  . APPENDECTOMY    . BREAST LUMPECTOMY Left 2010  . colon polyp removal    . EYE SURGERY    . HAND SURGERY    . ortho procedures     multiple  . OVARIAN CYST REMOVAL  1978  . SHOULDER SURGERY  1954   left   Family History  Problem Relation Age of Onset  . Heart disease Father   . Cancer Sister        lung, kidney  . Cancer  Brother        brain  . Cancer Paternal Aunt        breast  . Breast cancer Paternal Aunt   . Cancer Brother        liver, lung  . Osteoporosis Neg Hx    Social History   Socioeconomic History  . Marital status: Married    Spouse name: Not on file  . Number of children: 2  . Years of education: 29  . Highest education level: Not on file  Occupational History  . Occupation: retired  Tobacco Use  . Smoking status: Former Smoker    Quit date: 12/16/1986    Years since quitting: 32.9  . Smokeless tobacco: Never Used  . Tobacco comment: FORMER SMOKER 30 YEARS AGO  Substance and Sexual Activity  . Alcohol use: Yes    Alcohol/week: 0.0 standard drinks    Comment: OCCASIONALLY  . Drug use: No  . Sexual activity: Not on file  Other Topics Concern  . Not on file  Social History Narrative   Married   Exercise: Yes   Patient lives with husband in a one  story home.  Has 2 daughters.  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  Education: high school.     Social Determinants of Health   Financial Resource Strain: Low Risk   . Difficulty of Paying Living Expenses: Not hard at all  Food Insecurity: No Food Insecurity  . Worried About Charity fundraiser in the Last Year: Never true  . Ran Out of Food in the Last Year: Never true  Transportation Needs: No Transportation Needs  . Lack of Transportation (Medical): No  . Lack of Transportation (Non-Medical): No  Physical Activity:   . Days of Exercise per Week: Not on file  . Minutes of Exercise per Session: Not on file  Stress:   . Feeling of Stress : Not on file  Social Connections:   . Frequency of Communication with Friends and Family: Not on file  . Frequency of Social Gatherings with Friends and Family: Not on file  . Attends Religious Services: Not on file  . Active Member of Clubs or Organizations: Not on file  . Attends Archivist Meetings: Not on file  . Marital Status: Not on file    Outpatient Encounter Medications as of 11/14/2019  Medication Sig  . Calcium Carbonate-Vitamin D (CALCIUM + D PO) Take 1 capsule by mouth daily.   . citalopram (CELEXA) 10 MG tablet Take 1 tablet (10 mg total) by mouth daily.  Marland Kitchen latanoprost (XALATAN) 0.005 % ophthalmic solution Place 1 drop into both eyes at bedtime.   Marland Kitchen levothyroxine (SYNTHROID) 50 MCG tablet TAKE 1 TABLET BY MOUTH DAILY BEFORE BREAKFAST  . Multiple Vitamin (MULTIVITAMIN WITH MINERALS) TABS tablet Take 1 tablet by mouth daily.  . rosuvastatin (CRESTOR) 10 MG tablet TAKE 1 BY MOUTH DAILY (Patient taking differently: Take 10 mg by mouth daily. TAKE 1 BY MOUTH DAILY)  . verapamil (CALAN-SR) 120 MG CR tablet Take 1 tablet (120 mg total) by mouth daily.   No facility-administered encounter medications on file as of 11/14/2019.    Activities of Daily Living In your present state of health, do you have any difficulty performing the  following activities: 11/14/2019 02/22/2019  Hearing? N N  Vision? N N  Difficulty concentrating or making decisions? N N  Walking or climbing stairs? N N  Dressing or bathing? N N  Doing errands, shopping? N N  Preparing  Food and eating ? N -  Using the Toilet? N -  In the past six months, have you accidently leaked urine? N -  Do you have problems with loss of bowel control? N -  Managing your Medications? N -  Managing your Finances? N -  Housekeeping or managing your Housekeeping? N -  Some recent data might be hidden    Patient Care Team: Copland, Gay Filler, MD as PCP - General (Family Medicine) Buford Dresser, MD as PCP - Cardiology (Cardiology) [place], Peter M, MD (Cardiology) Roseanne Kaufman, MD (Orthopedic Surgery) Particia Nearing, MD (Dermatology) Heath Lark, MD as Consulting Physician (Hematology and Oncology)    Assessment:   This is a routine wellness examination for [place]. Physical assessment deferred to PCP.  Exercise Activities and Dietary recommendations Current Exercise Habits: Home exercise routine, Type of exercise: walking, Frequency (Times/Week): 7, Exercise limited by: None identified   Diet (meal preparation, eat out, water intake, caffeinated beverages, dairy products, fruits and vegetables): well balanced   Goals    . Maintain healthy active lifestyle.       Fall Risk Fall Risk  11/14/2019 11/11/2018 10/14/2017 08/27/2016 02/28/2016  Falls in the past year? 1 0 No No No  Comment - - - - -  Number falls in past yr: 0 - - - -  Injury with Fall? 1 - - - -  Follow up Education provided;Falls prevention discussed - - - -   Depression Screen PHQ 2/9 Scores 11/14/2019 11/11/2018 08/27/2016 02/28/2016  PHQ - 2 Score 0 0 0 0     Cognitive Function Ad8 score reviewed for issues:  Issues making decisions: no  Less interest in hobbies / activities:no  Repeats questions, stories (family complaining):no  Trouble using ordinary gadgets (microwave,  computer, phone):no  Forgets the month or year: no  Mismanaging finances: no  Remembering appts:no  Daily problems with thinking and/or memory:no Ad8 score is=0         Immunization History  Administered Date(s) Administered  . Fluad Quad(high Dose 65+) 07/04/2019  . Influenza Split 06/22/2012  . Influenza, High Dose Seasonal PF 07/15/2016, 08/17/2017, 07/14/2018  . Influenza,inj,Quad PF,6+ Mos 07/13/2013, 07/13/2014, 07/16/2015  . Pneumococcal Conjugate-13 05/30/2014  . Pneumococcal-Unspecified 05/13/2004  . Td 03/02/2017   Screening Tests Health Maintenance  Topic Date Due  . TETANUS/TDAP  03/03/2027  . INFLUENZA VACCINE  Completed  . DEXA SCAN  Completed  . PNA vac Low Risk Adult  Completed      Plan:  See you next year!  Continue to eat heart healthy diet (full of fruits, vegetables, whole grains, lean protein, water--limit salt, fat, and sugar intake) and increase physical activity as tolerated.  Continue doing brain stimulating activities (puzzles, reading, adult coloring books, staying active) to keep memory sharp.   Bring a copy of your living will and/or healthcare power of attorney to your next office visit.    I have personally reviewed and noted the following in the patient's chart:   . Medical and social history . Use of alcohol, tobacco or illicit drugs  . Current medications and supplements . Functional ability and status . Nutritional status . Physical activity . Advanced directives . List of other physicians . Hospitalizations, surgeries, and ER visits in previous 12 months . Vitals . Screenings to include cognitive, depression, and falls . Referrals and appointments  In addition, I have reviewed and discussed with patient certain preventive protocols, quality metrics, and best practice recommendations. A written personalized care plan  for preventive services as well as general preventive health recommendations were provided to patient.       Shela Nevin, [place]  11/14/2019

## 2019-11-13 NOTE — Progress Notes (Signed)
[facility] [place] [address] [place], [place] [address] [phone]  Date:  11/14/2019   Name:  [name]   DOB:  April 29, 1931   MRN:  [id number]  PCP:  Darreld Mclean, MD    Chief Complaint: No chief complaint on file.   History of Present Illness:  [name] is a 86 y.o. very pleasant female patient who presents with the following:  Virtual visit today for follow-up- Patient location is home, provider location is office The patient and her daughter [name] are on the call today Pt ID confirmed with 2 factors, she gives consent for virtual visit today   We visited about 3 weeks ago, at which time the patient had symptoms concerning for COVID-19 She had low temperatures, and we monitored her carefully for any worsening She did test positive for covid 19 on 10/22/2019 Thankfully her COVID-19 symptoms have resolved.  She feels back to normal  Back in December we had an office visit because [name] was having some issues with paranoia.  She felt that people were watching her, following her, and possibly meant her harm We started her on Celexa and refer to neurology for consultation Pt states that she stopped taking her celexa a week ago-she did not feel like it was really helping her.  At this point she states that paranoia and worry are no longer bothering her.  She admittedly has not been out much due to COVID-19 infection, but no longer worries about people lurking outside her home as she was before She did 10k steps already today  Today [name] states that she is feeing really well Her BP earlier today was 127/70 Temp 96 There has been some difficulty with checking her temperature, she seems to sometimes get temperature readings of 92 or 93 F although she feels fine.  She checked her temperature several times earlier today-started at 93, then rose to 96 degrees She has not been outdoors or exposed to cold  temperatures.  She feels well, does not feel cold.  I advised her that I suspect the temperature of 93 degrees is not real, we will continue to monitor  She also did trip and fall at home, lacerated her scalp last week.  She did not seek care due to concerns about Covid, the skin seems to be healing well  She feels fine, no headaches or other concerns.  She is not on blood thinners Tetanus is up-to-date Patient Active Problem List   Diagnosis Date Noted  . Essential hypertension 03/21/2019  . CKD (chronic kidney disease), stage II   . Systolic murmur 23/53/6144  . Hypercalcemia   . AKI (acute kidney injury) ([place])   . Dizziness 02/22/2019  . Squamous cell carcinoma in situ of skin 11/30/2018  . Diarrhea 09/08/2017  . Nausea vomiting and diarrhea 09/08/2017  . Elevated serum creatinine 09/08/2017  . PVC (premature ventricular contraction) 09/08/2017  . Glaucoma 09/08/2017  . Hx of meningioma of the brain 08/28/2017  . Hoarseness of voice 02/28/2016  . Raynaud's phenomenon 02/28/2016  . Esophageal spasm 09/20/2013  . Osteoporosis 03/18/2012  . Fracture, shoulder 03/06/2012  . Hyperlipidemia   . Breast cancer [facility])     Past Medical History:  Diagnosis Date  . Autoimmune disease ([place])   . Barrett's esophagus   . Bone lesion    sacrum  . Breast cancer ([place]) 2010   cT1 N0 M0; ER+/ PR+/ HER2 neg;  s/p lumpectomy 07/2010; started adjuvant hormonal therapy 10/2009  . Cervical cancer ([place])   . Dysphagia   . Fracture of left shoulder   . Fx distal radius NEC-closed   . Glaucoma   . Humerus fracture 03/2012   impacted left humueral neck fracture, treated by Dr. Amedeo Plenty  . Hyperlipidemia   . Hypertension   . Neuromuscular disorder ([place])   . Osteopenia   . Osteoporosis   . Osteoporosis 03/18/2012  . Personal history of chemotherapy 2010   Left Breast Cancer  . Personal history of radiation therapy 2010   Left Breast Cancer  . Pneumonia   . Thyroid disease     Past Surgical  History:  Procedure Laterality Date  . ABDOMINAL HYSTERECTOMY  1972   cancer          1 tube 1 ovary  . APPENDECTOMY    . BREAST LUMPECTOMY Left 2010  . colon polyp removal    . EYE SURGERY    . HAND SURGERY    . ortho procedures     multiple  . OVARIAN CYST REMOVAL  1978  . SHOULDER SURGERY  1954   left    Social History   Tobacco Use  . Smoking status: Former Smoker    Quit date: 12/16/1986    Years since quitting: 32.9  . Smokeless tobacco: Never Used  . Tobacco comment: FORMER SMOKER 30 YEARS AGO  Substance Use Topics  . Alcohol use: Yes    Alcohol/week: 0.0 standard drinks    Comment: OCCASIONALLY  . Drug use: No    Family History  Problem Relation Age of Onset  . Heart disease Father   . Cancer Sister        lung, kidney  . Cancer Brother        brain  . Cancer Paternal Aunt        breast  . Breast cancer Paternal Aunt   . Cancer Brother        liver, lung  . Osteoporosis Neg Hx     Allergies  Allergen Reactions  . Daypro [Oxaprozin] Swelling    Face and tongue  . Prolia [Denosumab]     Medication list has been reviewed and updated.  Current Outpatient Medications on File Prior to Visit  Medication Sig Dispense Refill  . Calcium Carbonate-Vitamin D (CALCIUM + D PO) Take 1 capsule by mouth daily.     . citalopram (CELEXA) 10 MG tablet Take 1 tablet (10 mg total) by mouth daily. 90 tablet 3  . latanoprost (XALATAN) 0.005 % ophthalmic solution Place 1 drop into both eyes at bedtime.     Marland Kitchen levothyroxine (SYNTHROID) 50 MCG tablet TAKE 1 TABLET BY MOUTH DAILY BEFORE BREAKFAST 90 tablet 3  . Multiple Vitamin (MULTIVITAMIN WITH MINERALS) TABS tablet Take 1 tablet by mouth daily.    . rosuvastatin (CRESTOR) 10 MG tablet TAKE 1 BY MOUTH DAILY (Patient taking differently: Take 10 mg by mouth daily. TAKE 1 BY MOUTH DAILY) 90 tablet 3  . verapamil (CALAN-SR) 120 MG CR tablet Take 1 tablet (120 mg total) by mouth daily. 90 tablet 2   No current  facility-administered medications on file prior to visit.    Review of Systems:  As per HPI- otherwise negative. She slipped and fell inside her home   Physical Examination: There were no vitals filed for this visit. There were no vitals filed for this visit. There is no height or weight on file to calculate BMI. Ideal  Body Weight:    Spoke with pt and her daughter on the phone-  Patient sounds well, no cough, wheezing, or distress is noted Her daughter texted a photo of her scalp laceration to my phone, it appears to be healing well and does not appear infected.  This laceration occurred about 5 days ago, too late to suture except in extreme circumstances Assessment and Plan: Laceration of scalp, initial encounter  COVID-19 virus infection  Hypothermia, initial encounter  Essential hypertension  Virtual visit today to discuss a few concerns Recent scalp laceration at home, tetanus is up-to-date.  Photograph of laceration appears to show a healing wound, I do not appreciate signs of infection.  Patient feels it is getting better, she is washing it daily and applying Vaseline and a Band-Aid.  She will let me know if any other concerns or if sign of infection We discussed recent COVID-19 infection.  She is now past 2 weeks of quarantine, may go out in the community following standard precautions of masking and social distancing  She has stopped taking her Celexa, denies current symptoms of paranoia.  I am not sure if her paranoia has truly resolved, but is glad that she is feeling better.  I have asked her to let me know if the symptoms return when she is able to leave the house more freely again  Blood pressure well controlled on current dose of verapamil  Suspect temperature of 92 degrees not accurate, patient rechecked her temperature several times and then got reading of 96 degrees.  I have advised her to get a new thermometer  They will contact me if any concerns, otherwise  plan to follow-up in 6 months Spoke with patient for 15 minutes Moderate medical decision making    Signed Lamar Blinks, MD

## 2019-11-14 ENCOUNTER — Encounter: Payer: Self-pay | Admitting: Family Medicine

## 2019-11-14 ENCOUNTER — Encounter: Payer: Self-pay | Admitting: *Deleted

## 2019-11-14 ENCOUNTER — Ambulatory Visit (INDEPENDENT_AMBULATORY_CARE_PROVIDER_SITE_OTHER): Payer: Medicare Other | Admitting: Family Medicine

## 2019-11-14 ENCOUNTER — Other Ambulatory Visit: Payer: Self-pay

## 2019-11-14 ENCOUNTER — Ambulatory Visit (INDEPENDENT_AMBULATORY_CARE_PROVIDER_SITE_OTHER): Payer: Medicare Other | Admitting: *Deleted

## 2019-11-14 VITALS — BP 127/70

## 2019-11-14 DIAGNOSIS — Z0000 Encounter for general adult medical examination without abnormal findings: Secondary | ICD-10-CM | POA: Diagnosis not present

## 2019-11-14 DIAGNOSIS — U071 COVID-19: Secondary | ICD-10-CM

## 2019-11-14 DIAGNOSIS — I10 Essential (primary) hypertension: Secondary | ICD-10-CM | POA: Diagnosis not present

## 2019-11-14 DIAGNOSIS — T68XXXA Hypothermia, initial encounter: Secondary | ICD-10-CM | POA: Diagnosis not present

## 2019-11-14 DIAGNOSIS — S0101XA Laceration without foreign body of scalp, initial encounter: Secondary | ICD-10-CM | POA: Diagnosis not present

## 2019-11-14 NOTE — Patient Instructions (Signed)
See you next year!  Continue to eat heart healthy diet (full of fruits, vegetables, whole grains, lean protein, water--limit salt, fat, and sugar intake) and increase physical activity as tolerated.  Continue doing brain stimulating activities (puzzles, reading, adult coloring books, staying active) to keep memory sharp.   Bring a copy of your living will and/or healthcare power of attorney to your next office visit.   [name] , Thank you for taking time to come for your Medicare Wellness Visit. I appreciate your ongoing commitment to your health goals. Please review the following plan we discussed and let me know if I can assist you in the future.   These are the goals we discussed: Goals    . Maintain healthy active lifestyle.       This is a list of the screening recommended for you and due dates:  Health Maintenance  Topic Date Due  . Tetanus Vaccine  03/03/2027  . Flu Shot  Completed  . DEXA scan (bone density measurement)  Completed  . Pneumonia vaccines  Completed    Preventive Care 84 Years and Older, Female Preventive care refers to lifestyle choices and visits with your health care provider that can promote health and wellness. This includes:  A yearly physical exam. This is also called an annual well check.  Regular dental and eye exams.  Immunizations.  Screening for certain conditions.  Healthy lifestyle choices, such as diet and exercise. What can I expect for my preventive care visit? Physical exam Your health care provider will check:  Height and weight. These may be used to calculate body mass index (BMI), which is a measurement that tells if you are at a healthy weight.  Heart rate and blood pressure.  Your skin for abnormal spots. Counseling Your health care provider may ask you questions about:  Alcohol, tobacco, and drug use.  Emotional well-being.  Home and relationship well-being.  Sexual activity.  Eating habits.  History of  falls.  Memory and ability to understand (cognition).  Work and work Statistician.  Pregnancy and menstrual history. What immunizations do I need?  Influenza (flu) vaccine  This is recommended every year. Tetanus, diphtheria, and pertussis (Tdap) vaccine  You may need a Td booster every 10 years. Varicella (chickenpox) vaccine  You may need this vaccine if you have not already been vaccinated. Zoster (shingles) vaccine  You may need this after age 84. Pneumococcal conjugate (PCV13) vaccine  One dose is recommended after age 86. Pneumococcal polysaccharide (PPSV23) vaccine  One dose is recommended after age 18. Measles, mumps, and rubella (MMR) vaccine  You may need at least one dose of MMR if you were born in 1957 or later. You may also need a second dose. Meningococcal conjugate (MenACWY) vaccine  You may need this if you have certain conditions. Hepatitis A vaccine  You may need this if you have certain conditions or if you travel or work in places where you may be exposed to hepatitis A. Hepatitis B vaccine  You may need this if you have certain conditions or if you travel or work in places where you may be exposed to hepatitis B. Haemophilus influenzae type b (Hib) vaccine  You may need this if you have certain conditions. You may receive vaccines as individual doses or as more than one vaccine together in one shot (combination vaccines). Talk with your health care provider about the risks and benefits of combination vaccines. What tests do I need? Blood tests  Lipid and  cholesterol levels. These may be checked every 5 years, or more frequently depending on your overall health.  Hepatitis C test.  Hepatitis B test. Screening  Lung cancer screening. You may have this screening every year starting at age 47 if you have a 30-pack-year history of smoking and currently smoke or have quit within the past 15 years.  Colorectal cancer screening. All adults should  have this screening starting at age 32 and continuing until age 25. Your health care provider may recommend screening at age 38 if you are at increased risk. You will have tests every 1-10 years, depending on your results and the type of screening test.  Diabetes screening. This is done by checking your blood sugar (glucose) after you have not eaten for a while (fasting). You may have this done every 1-3 years.  Mammogram. This may be done every 1-2 years. Talk with your health care provider about how often you should have regular mammograms.  BRCA-related cancer screening. This may be done if you have a family history of breast, ovarian, tubal, or peritoneal cancers. Other tests  Sexually transmitted disease (STD) testing.  Bone density scan. This is done to screen for osteoporosis. You may have this done starting at age 29. Follow these instructions at home: Eating and drinking  Eat a diet that includes fresh fruits and vegetables, whole grains, lean protein, and low-fat dairy products. Limit your intake of foods with high amounts of sugar, saturated fats, and salt.  Take vitamin and mineral supplements as recommended by your health care provider.  Do not drink alcohol if your health care provider tells you not to drink.  If you drink alcohol: ? Limit how much you have to 0-1 drink a day. ? Be aware of how much alcohol is in your drink. In the [place] one drink equals one 12 oz bottle of beer (355 mL), one 5 oz glass of wine (148 mL), or one 1 oz glass of hard liquor (44 mL). Lifestyle  Take daily care of your teeth and gums.  Stay active. Exercise for at least 30 minutes on 5 or more days each week.  Do not use any products that contain nicotine or tobacco, such as cigarettes, e-cigarettes, and chewing tobacco. If you need help quitting, ask your health care provider.  If you are sexually active, practice safe sex. Use a condom or other form of protection in order to prevent STIs  (sexually transmitted infections).  Talk with your health care provider about taking a low-dose aspirin or statin. What's next?  Go to your health care provider once a year for a well check visit.  Ask your health care provider how often you should have your eyes and teeth checked.  Stay up to date on all vaccines. This information is not intended to replace advice given to you by your health care provider. Make sure you discuss any questions you have with your health care provider. Document Revised: 09/23/2018 Document Reviewed: 09/23/2018 Elsevier Patient Education  2020 [employer].

## 2019-11-17 ENCOUNTER — Telehealth: Payer: Self-pay | Admitting: Family Medicine

## 2019-11-17 NOTE — Telephone Encounter (Signed)
[name], [name] said she just spoke to you the other day. And would like for you to call her back

## 2019-11-17 NOTE — Telephone Encounter (Signed)
Pt had called because she had forgot to tell me during awv that she had fallen 12/20/2018. She tripped over a parking sign in [employer] lot. She busted her lip. She followed up with ER and then PCP. All issues have resolved.

## 2019-12-08 DIAGNOSIS — L821 Other seborrheic keratosis: Secondary | ICD-10-CM | POA: Diagnosis not present

## 2019-12-08 DIAGNOSIS — C4441 Basal cell carcinoma of skin of scalp and neck: Secondary | ICD-10-CM | POA: Diagnosis not present

## 2019-12-08 DIAGNOSIS — Z85828 Personal history of other malignant neoplasm of skin: Secondary | ICD-10-CM | POA: Diagnosis not present

## 2019-12-08 DIAGNOSIS — D485 Neoplasm of uncertain behavior of skin: Secondary | ICD-10-CM | POA: Diagnosis not present

## 2019-12-08 DIAGNOSIS — L570 Actinic keratosis: Secondary | ICD-10-CM | POA: Diagnosis not present

## 2019-12-08 DIAGNOSIS — C44319 Basal cell carcinoma of skin of other parts of face: Secondary | ICD-10-CM | POA: Diagnosis not present

## 2019-12-08 DIAGNOSIS — L080 Pyoderma: Secondary | ICD-10-CM | POA: Diagnosis not present

## 2019-12-08 DIAGNOSIS — C44311 Basal cell carcinoma of skin of nose: Secondary | ICD-10-CM | POA: Diagnosis not present

## 2019-12-21 DIAGNOSIS — C44321 Squamous cell carcinoma of skin of nose: Secondary | ICD-10-CM | POA: Diagnosis not present

## 2019-12-21 DIAGNOSIS — C44319 Basal cell carcinoma of skin of other parts of face: Secondary | ICD-10-CM | POA: Diagnosis not present

## 2019-12-21 DIAGNOSIS — Z85828 Personal history of other malignant neoplasm of skin: Secondary | ICD-10-CM | POA: Diagnosis not present

## 2020-01-15 IMAGING — DX FACIAL BONES COMPLETE 3+V
3 series · 3 of 3 positions shown · non-contrast
Comparison: Head CT, 08/24/2014

CLINICAL DATA: Fell on [REDACTED] landing on her face. Facial bruising.
Nasal region pain.

EXAM:
FACIAL BONES COMPLETE 3+V

[facial pa townes]
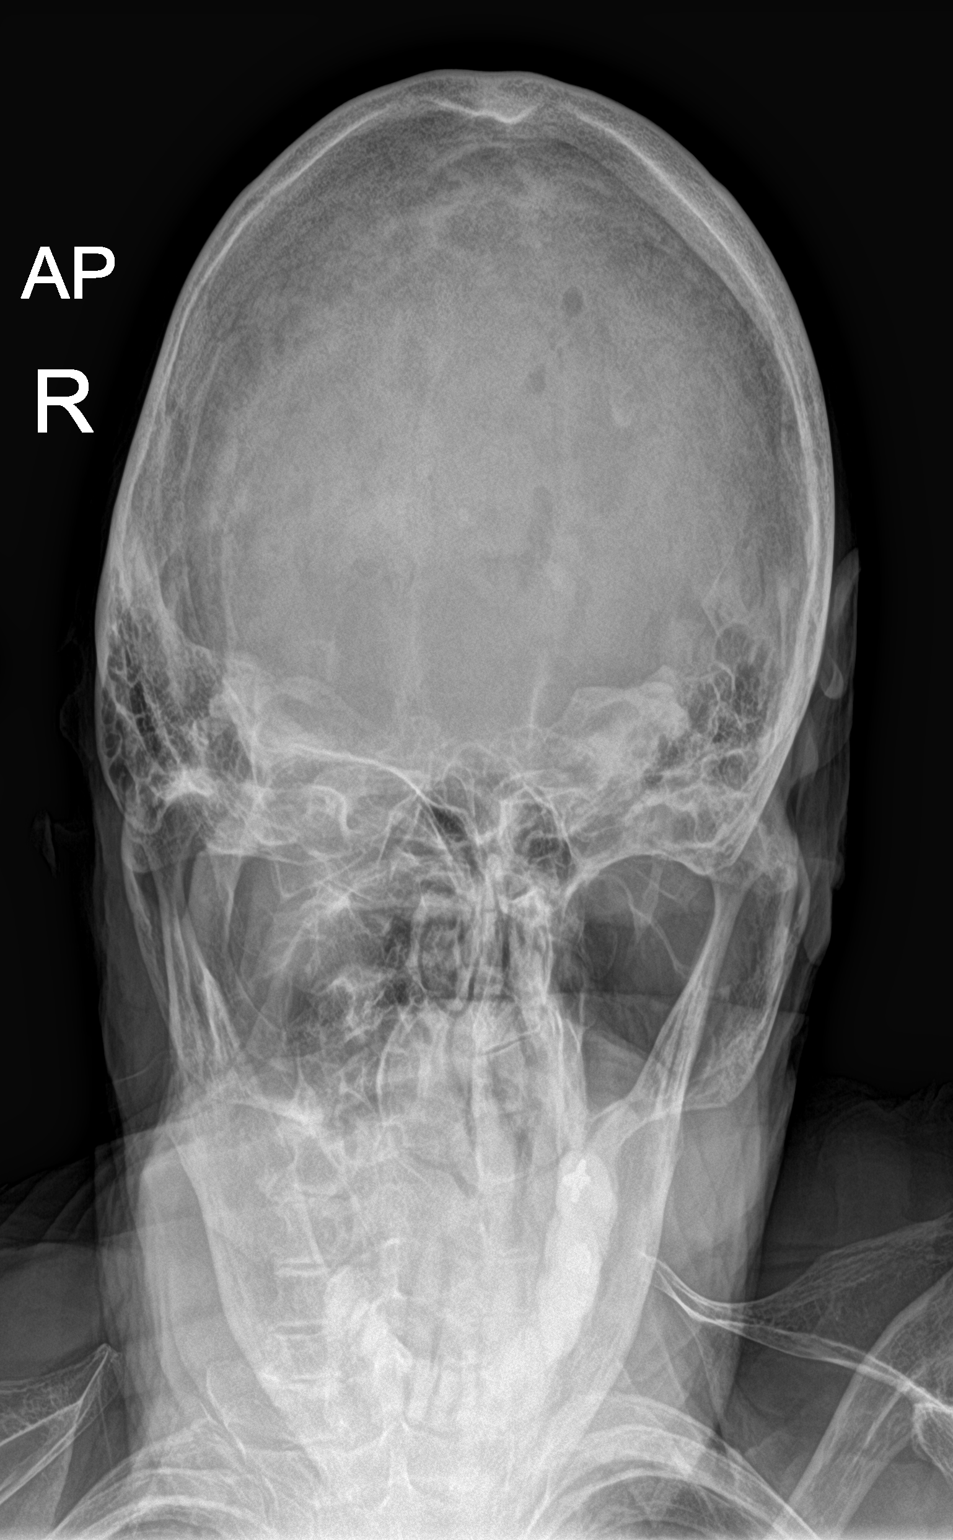

[facial waters]
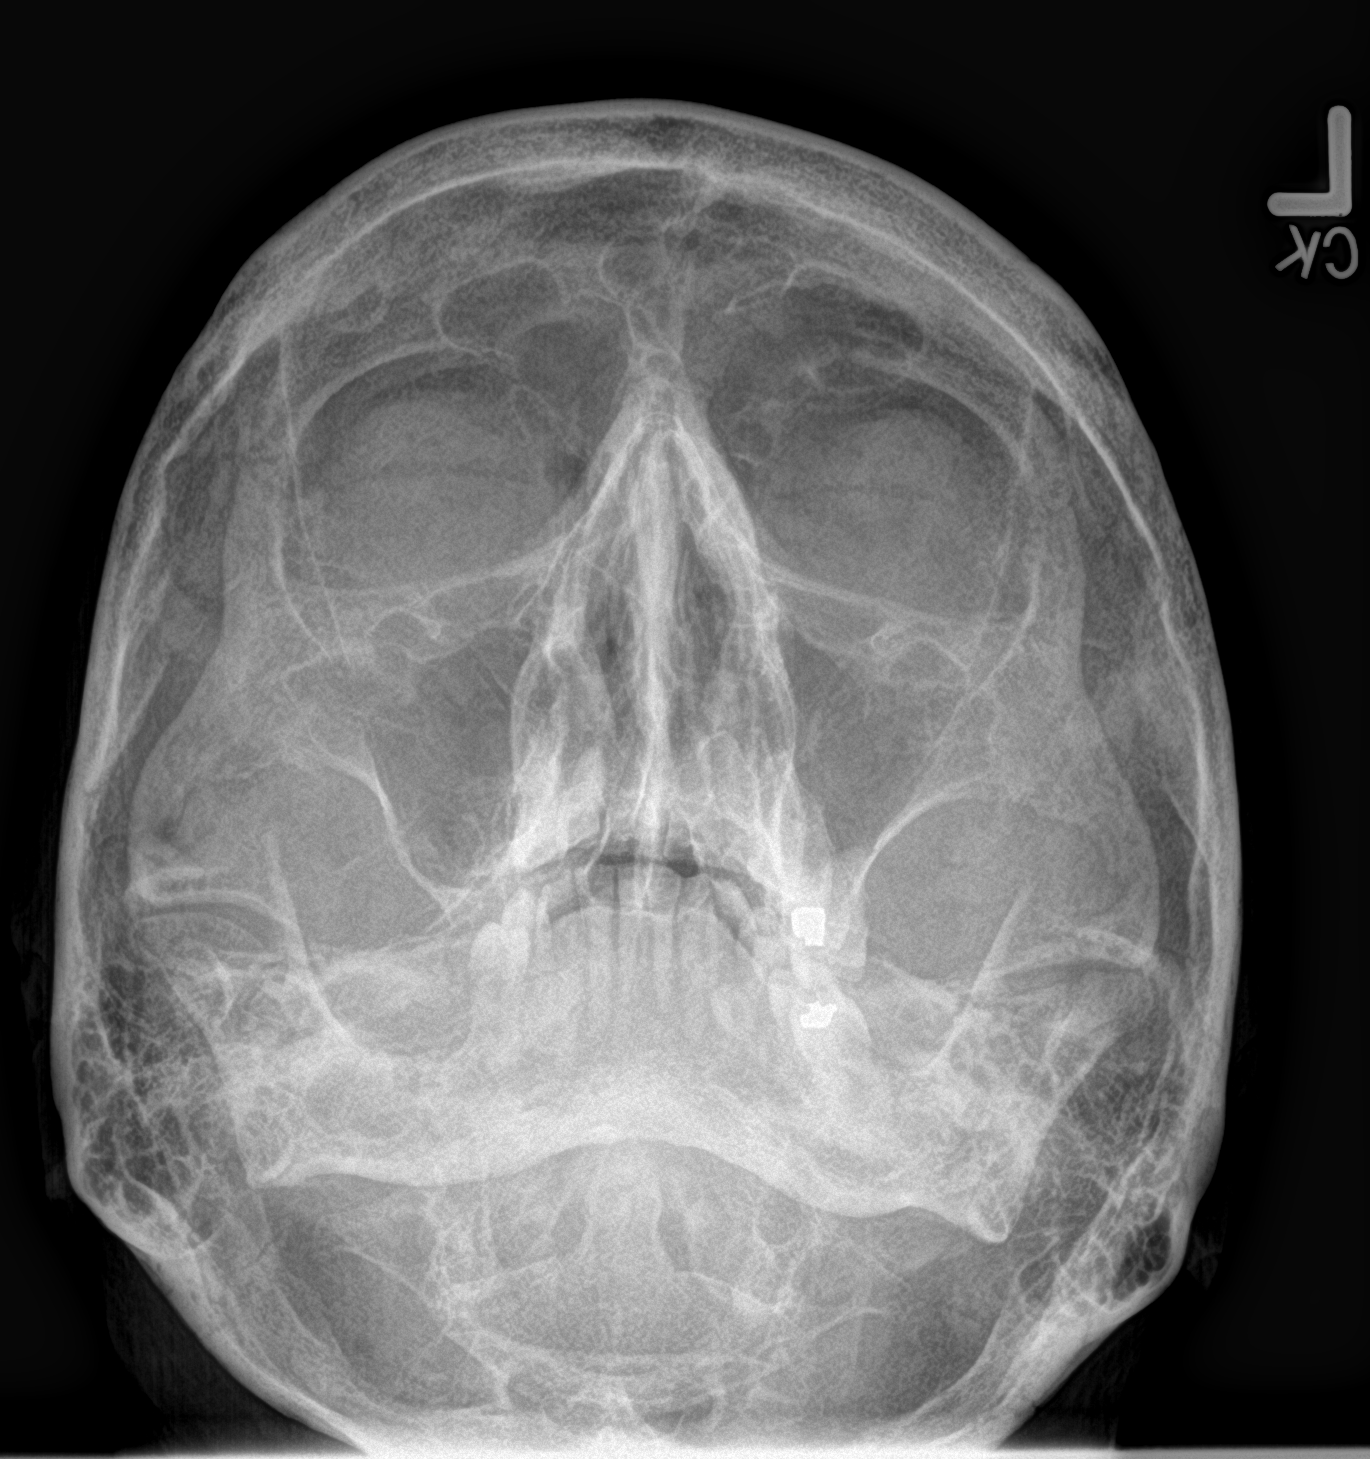

[facial lateral]
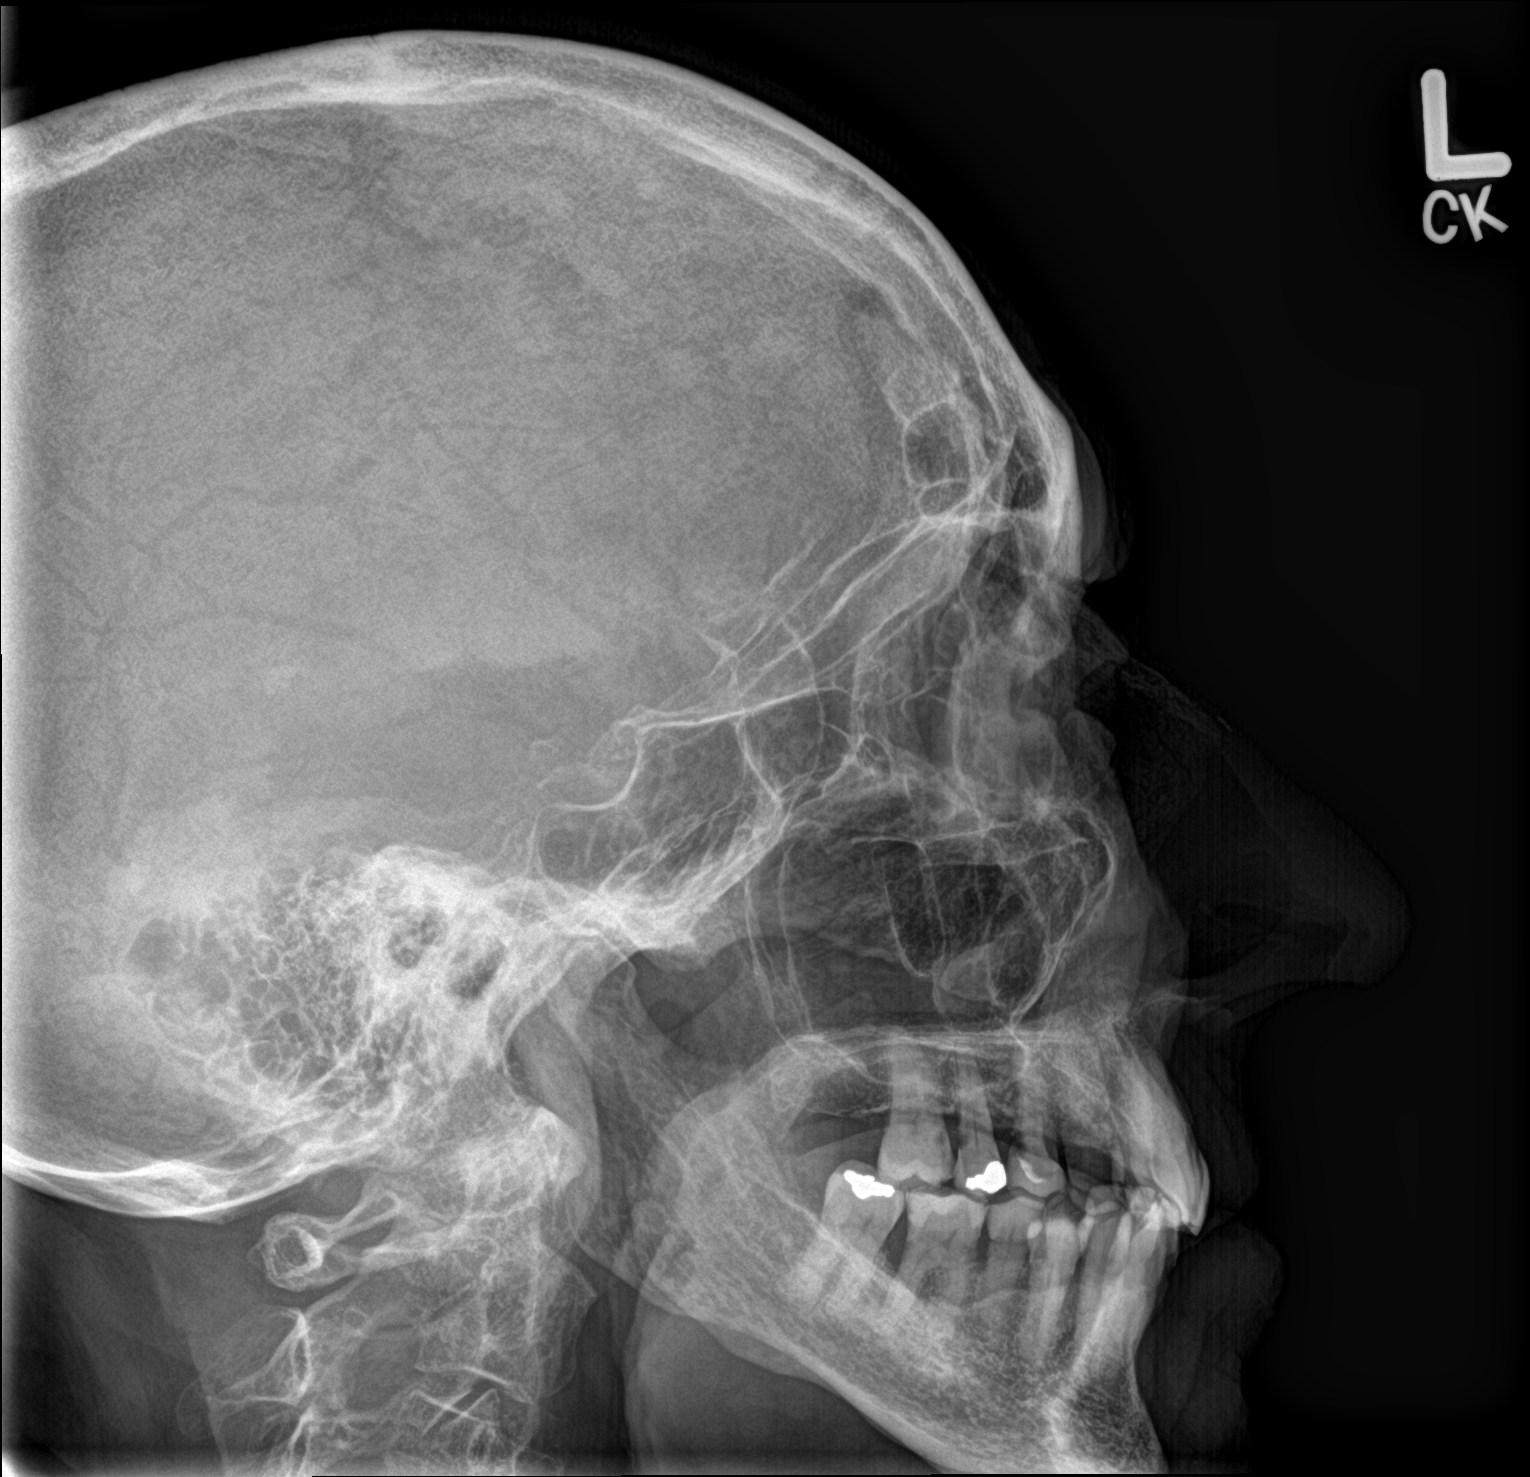

[3 of 3 positions shown; findings below may reference images not displayed]

FINDINGS: No convincing fracture.  No bone lesion.

Sinuses are clear.

Soft tissues are unremarkable.
IMPRESSION: Negative.

## 2020-01-15 IMAGING — DX NASAL BONES - 3+ VIEW
2 series · 2 of 2 positions shown · non-contrast
Comparison: Head CT, 08/24/2014

CLINICAL DATA: [place] at [place] on [REDACTED] landed straight on her
face. Pt has visible bruising all over face and states that she is
having most of her pain around the nasal area. Waters view is on the
facial bones order.

EXAM:
NASAL BONES - 3+ VIEW

[nasal lat (1 of 2)]
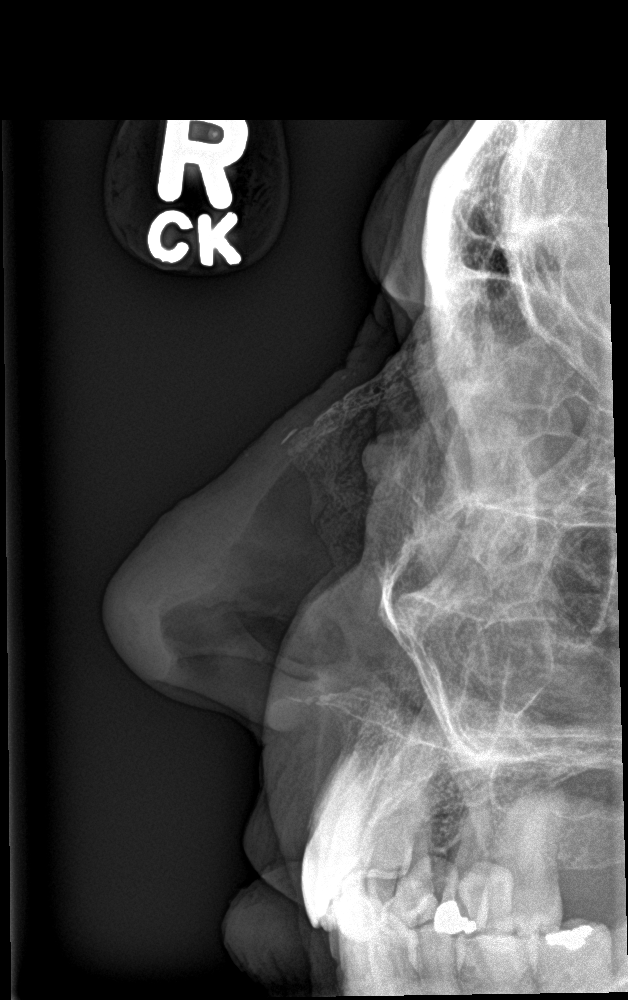

[nasal lat (2 of 2)]
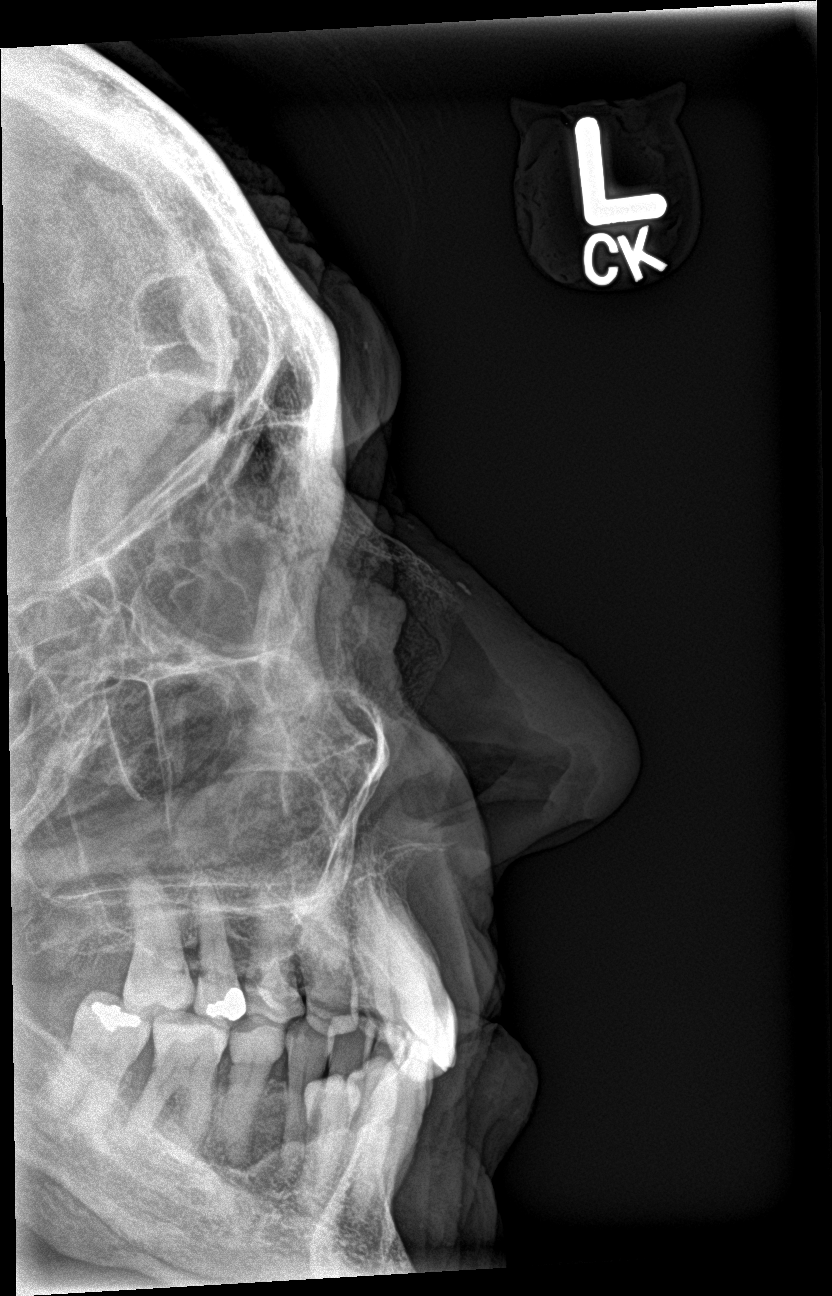

[2 of 2 positions shown; findings below may reference images not displayed]

FINDINGS: Sliver of increased density, which could reflect a minimal fracture,
lies along the anterior inferior aspect of the nasal bone. Three
other tiny densities are noted in the soft tissues of the anterior
nose consistent with soft tissue calcifications.

No other evidence of fracture.  Mild nasal soft tissue swelling.
IMPRESSION: 1. Sliver of density consistent with a small bone fragment lies
along the anterior margin of the inferior nasal bone consistent with
a minimal fracture. This could be a chronic finding or acute. No
other evidence of a fracture.

## 2020-01-20 DIAGNOSIS — H02821 Cysts of right upper eyelid: Secondary | ICD-10-CM | POA: Diagnosis not present

## 2020-01-20 DIAGNOSIS — Z961 Presence of intraocular lens: Secondary | ICD-10-CM | POA: Diagnosis not present

## 2020-01-20 DIAGNOSIS — H02822 Cysts of right lower eyelid: Secondary | ICD-10-CM | POA: Diagnosis not present

## 2020-01-20 DIAGNOSIS — H401132 Primary open-angle glaucoma, bilateral, moderate stage: Secondary | ICD-10-CM | POA: Diagnosis not present

## 2020-02-29 ENCOUNTER — Telehealth: Payer: Self-pay

## 2020-02-29 DIAGNOSIS — E782 Mixed hyperlipidemia: Secondary | ICD-10-CM

## 2020-02-29 MED ORDER — ROSUVASTATIN CALCIUM 10 MG PO TABS: 10.0000 mg | ORAL_TABLET | Freq: Every day | ORAL | 1 refills | Status: DC

## 2020-02-29 MED ORDER — VERAPAMIL HCL ER 120 MG PO TBCR: 120.0000 mg | EXTENDED_RELEASE_TABLET | Freq: Every day | ORAL | 2 refills | Status: DC

## 2020-02-29 NOTE — Telephone Encounter (Signed)
Patient called in to get a prescription refill for verapamil (CALAN-SR) 120 MG CR tablet [[id number]]    rosuvastatin (CRESTOR) 10 MG tablet [[id number]]    Please send it to [id number] [address], [place] [address]  Phone:  [phone] Fax:  [phone]  DEA #:  [id number]

## 2020-02-29 NOTE — Telephone Encounter (Signed)
Medication refilled

## 2020-03-29 DIAGNOSIS — C44619 Basal cell carcinoma of skin of left upper limb, including shoulder: Secondary | ICD-10-CM | POA: Diagnosis not present

## 2020-03-29 DIAGNOSIS — Z85828 Personal history of other malignant neoplasm of skin: Secondary | ICD-10-CM | POA: Diagnosis not present

## 2020-03-29 DIAGNOSIS — D485 Neoplasm of uncertain behavior of skin: Secondary | ICD-10-CM | POA: Diagnosis not present

## 2020-04-04 ENCOUNTER — Encounter: Payer: Self-pay | Admitting: Family Medicine

## 2020-04-04 DIAGNOSIS — D049 Carcinoma in situ of skin, unspecified: Secondary | ICD-10-CM

## 2020-04-04 HISTORY — DX: Carcinoma in situ of skin, unspecified: D04.9

## 2020-04-11 DIAGNOSIS — H00021 Hordeolum internum right upper eyelid: Secondary | ICD-10-CM | POA: Diagnosis not present

## 2020-04-11 DIAGNOSIS — H02821 Cysts of right upper eyelid: Secondary | ICD-10-CM | POA: Diagnosis not present

## 2020-04-18 DIAGNOSIS — H43392 Other vitreous opacities, left eye: Secondary | ICD-10-CM | POA: Diagnosis not present

## 2020-04-18 DIAGNOSIS — H0100A Unspecified blepharitis right eye, upper and lower eyelids: Secondary | ICD-10-CM | POA: Diagnosis not present

## 2020-04-18 DIAGNOSIS — H401132 Primary open-angle glaucoma, bilateral, moderate stage: Secondary | ICD-10-CM | POA: Diagnosis not present

## 2020-04-18 DIAGNOSIS — H00021 Hordeolum internum right upper eyelid: Secondary | ICD-10-CM | POA: Diagnosis not present

## 2020-04-29 ENCOUNTER — Other Ambulatory Visit: Payer: Self-pay

## 2020-04-29 ENCOUNTER — Emergency Department (HOSPITAL_COMMUNITY): Payer: Medicare Other

## 2020-04-29 ENCOUNTER — Encounter (HOSPITAL_COMMUNITY): Payer: Self-pay | Admitting: Emergency Medicine

## 2020-04-29 ENCOUNTER — Inpatient Hospital Stay (HOSPITAL_COMMUNITY)
Admission: EM | Admit: 2020-04-29 | Discharge: 2020-05-05 | DRG: 083 | Disposition: A | Discharge status: Rehabilitation Facility | Payer: Medicare Other | Attending: General Surgery | Admitting: General Surgery

## 2020-04-29 DIAGNOSIS — J320 Chronic maxillary sinusitis: Secondary | ICD-10-CM | POA: Diagnosis not present

## 2020-04-29 DIAGNOSIS — Y92009 Unspecified place in unspecified non-institutional (private) residence as the place of occurrence of the external cause: Secondary | ICD-10-CM | POA: Insufficient documentation

## 2020-04-29 DIAGNOSIS — H409 Unspecified glaucoma: Secondary | ICD-10-CM | POA: Diagnosis present

## 2020-04-29 DIAGNOSIS — I129 Hypertensive chronic kidney disease with stage 1 through stage 4 chronic kidney disease, or unspecified chronic kidney disease: Secondary | ICD-10-CM | POA: Diagnosis present

## 2020-04-29 DIAGNOSIS — M549 Dorsalgia, unspecified: Secondary | ICD-10-CM

## 2020-04-29 DIAGNOSIS — S0512XA Contusion of eyeball and orbital tissues, left eye, initial encounter: Secondary | ICD-10-CM | POA: Diagnosis not present

## 2020-04-29 DIAGNOSIS — R55 Syncope and collapse: Secondary | ICD-10-CM

## 2020-04-29 DIAGNOSIS — R4020 Unspecified coma: Secondary | ICD-10-CM | POA: Diagnosis not present

## 2020-04-29 DIAGNOSIS — R402142 Coma scale, eyes open, spontaneous, at arrival to emergency department: Secondary | ICD-10-CM | POA: Diagnosis present

## 2020-04-29 DIAGNOSIS — I7090 Unspecified atherosclerosis: Secondary | ICD-10-CM | POA: Diagnosis not present

## 2020-04-29 DIAGNOSIS — W1830XA Fall on same level, unspecified, initial encounter: Secondary | ICD-10-CM | POA: Diagnosis present

## 2020-04-29 DIAGNOSIS — R402242 Coma scale, best verbal response, confused conversation, at arrival to emergency department: Secondary | ICD-10-CM | POA: Diagnosis present

## 2020-04-29 DIAGNOSIS — E039 Hypothyroidism, unspecified: Secondary | ICD-10-CM | POA: Diagnosis not present

## 2020-04-29 DIAGNOSIS — M47812 Spondylosis without myelopathy or radiculopathy, cervical region: Secondary | ICD-10-CM | POA: Diagnosis present

## 2020-04-29 DIAGNOSIS — E871 Hypo-osmolality and hyponatremia: Secondary | ICD-10-CM | POA: Diagnosis not present

## 2020-04-29 DIAGNOSIS — Z853 Personal history of malignant neoplasm of breast: Secondary | ICD-10-CM | POA: Diagnosis not present

## 2020-04-29 DIAGNOSIS — Z8616 Personal history of COVID-19: Secondary | ICD-10-CM

## 2020-04-29 DIAGNOSIS — M5136 Other intervertebral disc degeneration, lumbar region: Secondary | ICD-10-CM | POA: Diagnosis not present

## 2020-04-29 DIAGNOSIS — S069XAA Unspecified intracranial injury with loss of consciousness status unknown, initial encounter: Secondary | ICD-10-CM

## 2020-04-29 DIAGNOSIS — Z86011 Personal history of benign neoplasm of the brain: Secondary | ICD-10-CM

## 2020-04-29 DIAGNOSIS — M545 Low back pain: Secondary | ICD-10-CM | POA: Diagnosis not present

## 2020-04-29 DIAGNOSIS — M4856XA Collapsed vertebra, not elsewhere classified, lumbar region, initial encounter for fracture: Secondary | ICD-10-CM | POA: Diagnosis not present

## 2020-04-29 DIAGNOSIS — S0285XA Fracture of orbit, unspecified, initial encounter for closed fracture: Secondary | ICD-10-CM | POA: Diagnosis not present

## 2020-04-29 DIAGNOSIS — E785 Hyperlipidemia, unspecified: Secondary | ICD-10-CM | POA: Diagnosis present

## 2020-04-29 DIAGNOSIS — Z87891 Personal history of nicotine dependence: Secondary | ICD-10-CM

## 2020-04-29 DIAGNOSIS — W19XXXA Unspecified fall, initial encounter: Secondary | ICD-10-CM | POA: Diagnosis not present

## 2020-04-29 DIAGNOSIS — Z20822 Contact with and (suspected) exposure to covid-19: Secondary | ICD-10-CM | POA: Diagnosis present

## 2020-04-29 DIAGNOSIS — Z8541 Personal history of malignant neoplasm of cervix uteri: Secondary | ICD-10-CM | POA: Diagnosis not present

## 2020-04-29 DIAGNOSIS — Z79890 Hormone replacement therapy (postmenopausal): Secondary | ICD-10-CM

## 2020-04-29 DIAGNOSIS — C50919 Malignant neoplasm of unspecified site of unspecified female breast: Secondary | ICD-10-CM | POA: Diagnosis present

## 2020-04-29 DIAGNOSIS — G319 Degenerative disease of nervous system, unspecified: Secondary | ICD-10-CM | POA: Diagnosis not present

## 2020-04-29 DIAGNOSIS — Z9221 Personal history of antineoplastic chemotherapy: Secondary | ICD-10-CM | POA: Diagnosis not present

## 2020-04-29 DIAGNOSIS — N1831 Chronic kidney disease, stage 3a: Secondary | ICD-10-CM | POA: Diagnosis not present

## 2020-04-29 DIAGNOSIS — Z85828 Personal history of other malignant neoplasm of skin: Secondary | ICD-10-CM

## 2020-04-29 DIAGNOSIS — Z888 Allergy status to other drugs, medicaments and biological substances status: Secondary | ICD-10-CM

## 2020-04-29 DIAGNOSIS — S069X9D Unspecified intracranial injury with loss of consciousness of unspecified duration, subsequent encounter: Secondary | ICD-10-CM | POA: Diagnosis not present

## 2020-04-29 DIAGNOSIS — S0232XA Fracture of orbital floor, left side, initial encounter for closed fracture: Secondary | ICD-10-CM | POA: Diagnosis not present

## 2020-04-29 DIAGNOSIS — R296 Repeated falls: Secondary | ICD-10-CM | POA: Diagnosis present

## 2020-04-29 DIAGNOSIS — Y9222 Religious institution as the place of occurrence of the external cause: Secondary | ICD-10-CM

## 2020-04-29 DIAGNOSIS — Z961 Presence of intraocular lens: Secondary | ICD-10-CM | POA: Diagnosis not present

## 2020-04-29 DIAGNOSIS — R569 Unspecified convulsions: Secondary | ICD-10-CM | POA: Diagnosis not present

## 2020-04-29 DIAGNOSIS — I10 Essential (primary) hypertension: Secondary | ICD-10-CM | POA: Diagnosis present

## 2020-04-29 DIAGNOSIS — R001 Bradycardia, unspecified: Secondary | ICD-10-CM

## 2020-04-29 DIAGNOSIS — S3992XA Unspecified injury of lower back, initial encounter: Secondary | ICD-10-CM | POA: Diagnosis not present

## 2020-04-29 DIAGNOSIS — I7300 Raynaud's syndrome without gangrene: Secondary | ICD-10-CM | POA: Diagnosis present

## 2020-04-29 DIAGNOSIS — Z8249 Family history of ischemic heart disease and other diseases of the circulatory system: Secondary | ICD-10-CM

## 2020-04-29 DIAGNOSIS — R58 Hemorrhage, not elsewhere classified: Secondary | ICD-10-CM | POA: Diagnosis not present

## 2020-04-29 DIAGNOSIS — S066X0A Traumatic subarachnoid hemorrhage without loss of consciousness, initial encounter: Secondary | ICD-10-CM | POA: Diagnosis not present

## 2020-04-29 DIAGNOSIS — E860 Dehydration: Secondary | ICD-10-CM | POA: Diagnosis present

## 2020-04-29 DIAGNOSIS — M810 Age-related osteoporosis without current pathological fracture: Secondary | ICD-10-CM | POA: Diagnosis present

## 2020-04-29 DIAGNOSIS — S0285XB Fracture of orbit, unspecified, initial encounter for open fracture: Secondary | ICD-10-CM | POA: Diagnosis not present

## 2020-04-29 DIAGNOSIS — S0181XA Laceration without foreign body of other part of head, initial encounter: Secondary | ICD-10-CM | POA: Diagnosis not present

## 2020-04-29 DIAGNOSIS — R402362 Coma scale, best motor response, obeys commands, at arrival to emergency department: Secondary | ICD-10-CM | POA: Diagnosis present

## 2020-04-29 DIAGNOSIS — R410 Disorientation, unspecified: Secondary | ICD-10-CM | POA: Diagnosis not present

## 2020-04-29 DIAGNOSIS — I629 Nontraumatic intracranial hemorrhage, unspecified: Secondary | ICD-10-CM

## 2020-04-29 DIAGNOSIS — Z803 Family history of malignant neoplasm of breast: Secondary | ICD-10-CM

## 2020-04-29 DIAGNOSIS — J3489 Other specified disorders of nose and nasal sinuses: Secondary | ICD-10-CM | POA: Diagnosis not present

## 2020-04-29 DIAGNOSIS — Z923 Personal history of irradiation: Secondary | ICD-10-CM | POA: Diagnosis not present

## 2020-04-29 DIAGNOSIS — D62 Acute posthemorrhagic anemia: Secondary | ICD-10-CM | POA: Diagnosis not present

## 2020-04-29 DIAGNOSIS — S02841D Fracture of lateral orbital wall, right side, subsequent encounter for fracture with routine healing: Secondary | ICD-10-CM | POA: Diagnosis not present

## 2020-04-29 DIAGNOSIS — M4316 Spondylolisthesis, lumbar region: Secondary | ICD-10-CM | POA: Diagnosis not present

## 2020-04-29 DIAGNOSIS — Z79899 Other long term (current) drug therapy: Secondary | ICD-10-CM

## 2020-04-29 DIAGNOSIS — I361 Nonrheumatic tricuspid (valve) insufficiency: Secondary | ICD-10-CM | POA: Diagnosis not present

## 2020-04-29 DIAGNOSIS — S02842A Fracture of lateral orbital wall, left side, initial encounter for closed fracture: Secondary | ICD-10-CM | POA: Diagnosis not present

## 2020-04-29 DIAGNOSIS — I340 Nonrheumatic mitral (valve) insufficiency: Secondary | ICD-10-CM | POA: Diagnosis not present

## 2020-04-29 DIAGNOSIS — R404 Transient alteration of awareness: Secondary | ICD-10-CM | POA: Diagnosis not present

## 2020-04-29 DIAGNOSIS — S069X9A Unspecified intracranial injury with loss of consciousness of unspecified duration, initial encounter: Secondary | ICD-10-CM

## 2020-04-29 DIAGNOSIS — I609 Nontraumatic subarachnoid hemorrhage, unspecified: Secondary | ICD-10-CM

## 2020-04-29 DIAGNOSIS — G450 Vertebro-basilar artery syndrome: Secondary | ICD-10-CM | POA: Diagnosis not present

## 2020-04-29 DIAGNOSIS — S066X9D Traumatic subarachnoid hemorrhage with loss of consciousness of unspecified duration, subsequent encounter: Secondary | ICD-10-CM | POA: Diagnosis not present

## 2020-04-29 DIAGNOSIS — S066X9A Traumatic subarachnoid hemorrhage with loss of consciousness of unspecified duration, initial encounter: Secondary | ICD-10-CM | POA: Diagnosis not present

## 2020-04-29 HISTORY — DX: Fracture of orbit, unspecified, initial encounter for closed fracture: S02.85XA

## 2020-04-29 HISTORY — DX: Nontraumatic subarachnoid hemorrhage, unspecified: I60.9

## 2020-04-29 HISTORY — DX: Unspecified fall, initial encounter: Y92.009

## 2020-04-29 HISTORY — DX: Unspecified fall, initial encounter: W19.XXXA

## 2020-04-29 LAB — COMPREHENSIVE METABOLIC PANEL
ALT: 26 U/L (ref 0–44)
AST: 37 U/L (ref 15–41)
Albumin: 3.5 g/dL (ref 3.5–5.0)
Alkaline Phosphatase: 81 U/L (ref 38–126)
Anion gap: 13 (ref 5–15)
BUN: 16 mg/dL (ref 8–23)
CO2: 22 mmol/L (ref 22–32)
Calcium: 9.6 mg/dL (ref 8.9–10.3)
Chloride: 105 mmol/L (ref 98–111)
Creatinine, Ser: 1.06 mg/dL — ABNORMAL HIGH (ref 0.44–1.00)
GFR calc Af Amer: 55 mL/min — ABNORMAL LOW (ref >60)
GFR calc non Af Amer: 47 mL/min — ABNORMAL LOW (ref >60)
Glucose, Bld: 116 mg/dL — ABNORMAL HIGH (ref 70–99)
Potassium: 4.1 mmol/L (ref 3.5–5.1)
Sodium: 140 mmol/L (ref 135–145)
Total Bilirubin: 0.8 mg/dL (ref 0.3–1.2)
Total Protein: 6.5 g/dL (ref 6.5–8.1)

## 2020-04-29 LAB — CBC WITH DIFFERENTIAL/PLATELET
Abs Immature Granulocytes: 0.17 10*3/uL — ABNORMAL HIGH (ref 0.00–0.07)
Basophils Absolute: 0.1 10*3/uL (ref 0.0–0.1)
Basophils Relative: 1 %
Eosinophils Absolute: 0.2 10*3/uL (ref 0.0–0.5)
Eosinophils Relative: 1 %
HCT: 39.7 % (ref 36.0–46.0)
Hemoglobin: 12.2 g/dL (ref 12.0–15.0)
Immature Granulocytes: 2 %
Lymphocytes Relative: 13 %
Lymphs Abs: 1.5 10*3/uL (ref 0.7–4.0)
MCH: 29.8 pg (ref 26.0–34.0)
MCHC: 30.7 g/dL (ref 30.0–36.0)
MCV: 96.8 fL (ref 80.0–100.0)
Monocytes Absolute: 0.7 10*3/uL (ref 0.1–1.0)
Monocytes Relative: 6 %
Neutro Abs: 8.8 10*3/uL — ABNORMAL HIGH (ref 1.7–7.7)
Neutrophils Relative %: 77 %
Platelets: 202 10*3/uL (ref 150–400)
RBC: 4.1 MIL/uL (ref 3.87–5.11)
RDW: 15.2 % (ref 11.5–15.5)
WBC: 11.3 10*3/uL — ABNORMAL HIGH (ref 4.0–10.5)
nRBC: 0 % (ref 0.0–0.2)

## 2020-04-29 LAB — I-STAT CHEM 8, ED
BUN: 19 mg/dL (ref 8–23)
Calcium, Ion: 1.08 mmol/L — ABNORMAL LOW (ref 1.15–1.40)
Chloride: 106 mmol/L (ref 98–111)
Creatinine, Ser: 1 mg/dL (ref 0.44–1.00)
Glucose, Bld: 107 mg/dL — ABNORMAL HIGH (ref 70–99)
HCT: 39 % (ref 36.0–46.0)
Hemoglobin: 13.3 g/dL (ref 12.0–15.0)
Potassium: 4 mmol/L (ref 3.5–5.1)
Sodium: 140 mmol/L (ref 135–145)
TCO2: 20 mmol/L — ABNORMAL LOW (ref 22–32)

## 2020-04-29 LAB — MRSA PCR SCREENING: MRSA by PCR: NEGATIVE

## 2020-04-29 LAB — SARS CORONAVIRUS 2 BY RT PCR (HOSPITAL ORDER, PERFORMED IN ~~LOC~~ HOSPITAL LAB): SARS Coronavirus 2: NEGATIVE

## 2020-04-29 LAB — CBG MONITORING, ED: Glucose-Capillary: 123 mg/dL — ABNORMAL HIGH (ref 70–99)

## 2020-04-29 MED ORDER — DOCUSATE SODIUM 100 MG PO CAPS
100.0000 mg | ORAL_CAPSULE | Freq: Two times a day (BID) | ORAL | Status: DC
  Administered 2020-05-03 – 2020-05-05 (×5): 100 mg via ORAL
  Filled 2020-04-29 (×7): qty 1

## 2020-04-29 MED ORDER — ADULT MULTIVITAMIN W/MINERALS CH
1.0000 | ORAL_TABLET | Freq: Every day | ORAL | Status: DC
  Administered 2020-04-30 – 2020-05-04 (×5): 1 via ORAL
  Filled 2020-04-29 (×5): qty 1

## 2020-04-29 MED ORDER — ONDANSETRON 4 MG PO TBDP
4.0000 mg | ORAL_TABLET | Freq: Four times a day (QID) | ORAL | Status: DC | PRN
  Administered 2020-05-05: 4 mg via ORAL
  Filled 2020-04-29: qty 1

## 2020-04-29 MED ORDER — ONDANSETRON HCL 4 MG/2ML IJ SOLN
4.0000 mg | Freq: Four times a day (QID) | INTRAMUSCULAR | Status: DC | PRN
  Administered 2020-04-29: 4 mg via INTRAVENOUS
  Filled 2020-04-29 (×2): qty 2

## 2020-04-29 MED ORDER — OXYCODONE HCL 5 MG PO TABS: 2.5000 mg | ORAL_TABLET | ORAL | Status: DC | PRN

## 2020-04-29 MED ORDER — FENTANYL CITRATE (PF) 100 MCG/2ML IJ SOLN
50.0000 ug | Freq: Once | INTRAMUSCULAR | Status: AC
  Administered 2020-04-29: 50 ug via INTRAVENOUS
  Filled 2020-04-29: qty 2

## 2020-04-29 MED ORDER — PANTOPRAZOLE SODIUM 40 MG PO TBEC
40.0000 mg | DELAYED_RELEASE_TABLET | Freq: Every day | ORAL | Status: DC
  Administered 2020-04-30: 40 mg via ORAL
  Filled 2020-04-29: qty 1

## 2020-04-29 MED ORDER — LIDOCAINE-EPINEPHRINE 1 %-1:100000 IJ SOLN
10.0000 mL | Freq: Once | INTRAMUSCULAR | Status: AC
  Administered 2020-04-29: 10 mL
  Filled 2020-04-29: qty 1

## 2020-04-29 MED ORDER — LEVOTHYROXINE SODIUM 50 MCG PO TABS
50.0000 ug | ORAL_TABLET | Freq: Every day | ORAL | Status: DC
  Administered 2020-04-30 – 2020-05-05 (×5): 50 ug via ORAL
  Filled 2020-04-29 (×6): qty 1

## 2020-04-29 MED ORDER — HYDRALAZINE HCL 20 MG/ML IJ SOLN: 10.0000 mg | INTRAMUSCULAR | Status: DC | PRN

## 2020-04-29 MED ORDER — ACETAMINOPHEN 325 MG PO TABS
650.0000 mg | ORAL_TABLET | ORAL | Status: DC | PRN
  Administered 2020-04-29 – 2020-05-01 (×4): 650 mg via ORAL
  Filled 2020-04-29 (×5): qty 2

## 2020-04-29 MED ORDER — MORPHINE SULFATE (PF) 2 MG/ML IV SOLN: 1.0000 mg | INTRAVENOUS | Status: DC | PRN

## 2020-04-29 MED ORDER — POTASSIUM CHLORIDE IN NACL 20-0.9 MEQ/L-% IV SOLN
INTRAVENOUS | Status: DC
  Filled 2020-04-29 (×4): qty 1000

## 2020-04-29 MED ORDER — PROCHLORPERAZINE EDISYLATE 10 MG/2ML IJ SOLN
10.0000 mg | Freq: Four times a day (QID) | INTRAMUSCULAR | Status: DC | PRN
  Administered 2020-04-29: 10 mg via INTRAVENOUS
  Filled 2020-04-29: qty 2

## 2020-04-29 MED ORDER — PANTOPRAZOLE SODIUM 40 MG IV SOLR
40.0000 mg | Freq: Every day | INTRAVENOUS | Status: DC
  Administered 2020-04-29: 40 mg via INTRAVENOUS
  Filled 2020-04-29: qty 40

## 2020-04-29 MED ORDER — CHLORHEXIDINE GLUCONATE CLOTH 2 % EX PADS
6.0000 | MEDICATED_PAD | Freq: Every day | CUTANEOUS | Status: DC
  Administered 2020-04-29 – 2020-05-05 (×6): 6 via TOPICAL

## 2020-04-29 MED ORDER — TETANUS-DIPHTH-ACELL PERTUSSIS 5-2.5-18.5 LF-MCG/0.5 IM SUSP: 0.5000 mL | Freq: Once | INTRAMUSCULAR | Status: DC
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
## 2020-04-29 NOTE — ED Provider Notes (Signed)
[name]   CSN: [id number] Arrival date & time: 04/29/20  1027     History Chief Complaint  Patient presents with  . Fall  . Altered Mental Status    [name] is a 86 y.o. female.  Patient is a 86 year old female with a history of hypertension, hyperlipidemia, prior breast cancer, Raynaud's and hypothyroidism who presents after a fall.  She went to church this morning, per bystanders, she was at church alone.  She was found in the hallway after having an unwitnessed fall.  She was noted to have repetitive questioning and had facial trauma with a laceration to her scalp.  She has had several episodes of vomiting with some hematemesis per EMS.  She was given 2 doses of 4 mg of Zofran prior to arrival by EMS.  History is limited due to her confusion.  She does not remember going to church this morning.  She does not remember if she was feeling well or ill this morning.        Past Medical History:  Diagnosis Date  . Autoimmune disease ([place])   . Barrett's esophagus   . Bone lesion    sacrum  . Breast cancer ([place]) 2010   cT1 N0 M0; ER+/ PR+/ HER2 neg; s/p lumpectomy 07/2010; started adjuvant hormonal therapy 10/2009  . Cervical cancer ([place])   . Dysphagia   . Fx distal radius NEC-closed   . Glaucoma   . Humerus fracture 03/2012   impacted left humueral neck fracture, treated by Dr. Amedeo Plenty  . Hyperlipidemia   . Hypertension   . Osteoporosis 03/18/2012  . Pneumonia   . Thyroid disease     Patient Active Problem List   Diagnosis Date Noted  . Basal cell carcinoma (BCC) in situ of skin 04/04/2020  . Essential hypertension 03/21/2019  . CKD (chronic kidney disease), stage II   . Systolic murmur 57/32/2025  . Hypercalcemia   . AKI (acute kidney injury) ([place])   . Dizziness 02/22/2019  . Squamous cell carcinoma in situ of skin 11/30/2018  . Diarrhea 09/08/2017  . Nausea vomiting and diarrhea 09/08/2017  . Elevated  serum creatinine 09/08/2017  . PVC (premature ventricular contraction) 09/08/2017  . Glaucoma 09/08/2017  . Hx of meningioma of the brain 08/28/2017  . Hoarseness of voice 02/28/2016  . Raynaud's phenomenon 02/28/2016  . Esophageal spasm 09/20/2013  . Osteoporosis 03/18/2012  . Fracture, shoulder 03/06/2012  . Hyperlipidemia   . Breast cancer [facility])     Past Surgical History:  Procedure Laterality Date  . ABDOMINAL HYSTERECTOMY  1972   cancer          1 tube 1 ovary  . APPENDECTOMY    . BREAST LUMPECTOMY Left 2010  . colon polyp removal    . EYE SURGERY    . HAND SURGERY    . ortho procedures     multiple  . OVARIAN CYST REMOVAL  1978  . [place]   left     OB History   No obstetric history on file.     Family History  Problem Relation Age of Onset  . Heart disease Father   . Cancer Sister        lung, kidney  . Cancer Brother        brain  . Cancer Paternal Aunt        breast  . Breast cancer Paternal Aunt   . Cancer Brother  liver, lung  . Osteoporosis Neg Hx     Social History   Tobacco Use  . Smoking status: Former Smoker    Quit date: 12/16/1986    Years since quitting: 33.3  . Smokeless tobacco: Never Used  . Tobacco comment: FORMER SMOKER 30 YEARS AGO  Vaping Use  . Vaping Use: Never used  Substance Use Topics  . Alcohol use: Yes    Alcohol/week: 0.0 standard drinks    Comment: OCCASIONALLY  . Drug use: No    Home Medications Prior to Admission medications   Medication Sig Start Date End Date Taking? Authorizing Provider  Calcium Carbonate-Vitamin D (CALCIUM + D PO) Take 1 capsule by mouth daily.     [provider]  citalopram (CELEXA) 10 MG tablet Take 1 tablet (10 mg total) by mouth daily. 09/29/19   Copland, Gay Filler, MD  latanoprost (XALATAN) 0.005 % ophthalmic solution Place 1 drop into both eyes at bedtime.     [provider]  levothyroxine (SYNTHROID) 50 MCG tablet TAKE 1 TABLET BY MOUTH DAILY  BEFORE BREAKFAST 09/05/19   Copland, Gay Filler, MD  Multiple Vitamin (MULTIVITAMIN WITH MINERALS) TABS tablet Take 1 tablet by mouth daily.    [provider]  rosuvastatin (CRESTOR) 10 MG tablet Take 1 tablet (10 mg total) by mouth daily. TAKE 1 BY MOUTH DAILY 02/29/20   Copland, Gay Filler, MD  verapamil (CALAN-SR) 120 MG CR tablet Take 1 tablet (120 mg total) by mouth daily. 02/29/20   Copland, Gay Filler, MD    Allergies    Daypro [oxaprozin] and Prolia [denosumab]  Review of Systems   Review of Systems  Unable to perform ROS: Mental status change    Physical Exam Updated Vital Signs BP (!) 161/84   Pulse 72   Temp 97.7 F (36.5 C) (Oral)   Resp (!) 24   SpO2 100%   Physical Exam HENT:     Head:     Comments: Patient has periorbital swelling bilaterally with periorbital ecchymosis bilaterally, there is a 2.5 cm laceration overlying her left temporal area.  There is some mild oozing of blood from this area.  There is some dried blood in the naris.  She does not have full range of motion of the left eye as compared to the right, possibly due to the periorbital edema.  She seems to have limited ability to to do full upper gaze and lateral gaze to the left eye. Eyes:     Pupils: Pupils are equal, round, and reactive to light.  Neck:     Comments: C-collar in place, no obvious tenderness along the spine Cardiovascular:     Rate and Rhythm: Normal rate.     Heart sounds: No murmur heard.   Pulmonary:     Effort: Pulmonary effort is normal.     Breath sounds: Normal breath sounds.  Abdominal:     General: There is no distension.     Palpations: Abdomen is soft.     Tenderness: There is no abdominal tenderness.  Musculoskeletal:     Comments: No pain on range of motion or palpation of the extremities  Skin:    General: Skin is warm and dry.  Neurological:     Mental Status: She is alert.     Comments: Patient has 5 out of 5 strength in all extremities, sensation  grossly intact to light touch all extremities, no facial drooping, she can tell me her name but she keeps asking over  and over again where she is.  She does not know the month or the year.     ED Results / Procedures / Treatments   Labs (all labs ordered are listed, but only abnormal results are displayed) Labs Reviewed  COMPREHENSIVE METABOLIC PANEL - Abnormal; Notable for the following components:      Result Value   Glucose, Bld 116 (*)    Creatinine, Ser 1.06 (*)    GFR calc non Af Amer 47 (*)    GFR calc Af Amer 55 (*)    All other components within normal limits  CBC WITH DIFFERENTIAL/PLATELET - Abnormal; Notable for the following components:   WBC 11.3 (*)    Neutro Abs 8.8 (*)    Abs Immature Granulocytes 0.17 (*)    All other components within normal limits  I-STAT CHEM 8, ED - Abnormal; Notable for the following components:   Glucose, Bld 107 (*)    Calcium, Ion 1.08 (*)    TCO2 20 (*)    All other components within normal limits  CBG MONITORING, ED - Abnormal; Notable for the following components:   Glucose-Capillary 123 (*)    All other components within normal limits  SARS CORONAVIRUS 2 BY RT PCR [facility] ORDER, [place] LAB)    EKG EKG Interpretation  Date/Time:  Sunday April 29 2020 10:46:21 EDT Ventricular Rate:  57 PR Interval:    QRS Duration: 79 QT Interval:  456 QTC Calculation: 444 R Axis:   88 Text Interpretation: Sinus rhythm Borderline right axis deviation since last tracing no significant change Confirmed by Malvin Johns [phone]) on 04/29/2020 11:56:43 AM   Radiology CT Head Wo Contrast  Result Date: 04/29/2020 CLINICAL DATA:  Poly trauma. Loss of consciousness in confusion. EXAM: CT HEAD WITHOUT CONTRAST TECHNIQUE: Contiguous axial images were obtained from the base of the skull through the vertex without intravenous contrast. COMPARISON:  Feb 22, 2019 FINDINGS: Brain: There is no evidence of acute infarction. There is  an acute intracranial hemorrhage with bilateral frontal subarachnoid hemorrhage and small amount of right occipital subarachnoid hemorrhage. There is a focus of possible intraparenchymal hemorrhage in the left frontal subcortical gray matter. No evidence of midline shift or herniation. Moderate brain parenchymal volume loss and deep white matter microangiopathy. Vascular: Calcific atherosclerotic disease of the intra cavernous carotid arteries. Skull: Nondisplaced superolateral orbital fracture. Other: Left frontotemporal and periorbital preseptal scalp hematoma. IMPRESSION: 1. Acute intracranial hemorrhage with bilateral frontal subarachnoid hemorrhage and small amount of right occipital subarachnoid hemorrhage. 2. Possible focus of intraparenchymal hemorrhage in the left frontal subcortical gray matter. 3. Left frontotemporal and periorbital preseptal scalp hematoma. 4. Nondisplaced superolateral orbital fracture. Electronically Signed   By: Fidela Salisbury M.D.   On: 04/29/2020 11:53   CT Cervical Spine Wo Contrast  Result Date: 04/29/2020 CLINICAL DATA:  Status post fall. EXAM: CT CERVICAL SPINE WITHOUT CONTRAST TECHNIQUE: Multidetector CT imaging of the cervical spine was performed without intravenous contrast. Multiplanar CT image reconstructions were also generated. COMPARISON:  Cervical spine radiograph and MRI from 2008, report only. FINDINGS: Alignment: Normal. Skull base and vertebrae: No acute fracture. No primary bone lesion or focal pathologic process. Soft tissues and spinal canal: No prevertebral fluid or swelling. No visible canal hematoma. Disc levels: Multilevel osteoarthritic changes of the cervical spine. Upper chest: Biapical pleural calcifications. IMPRESSION: 1. No evidence of acute traumatic injury to the cervical spine. 2. Multilevel osteoarthritic changes of the cervical spine. Electronically Signed   By: Thomas Hoff  Dimitrova M.D.   On: 04/29/2020 12:11   CT Maxillofacial Wo  Contrast  Result Date: 04/29/2020 CLINICAL DATA:  Status post fall with facial trauma. EXAM: CT MAXILLOFACIAL WITHOUT CONTRAST TECHNIQUE: Multidetector CT imaging of the maxillofacial structures was performed. Multiplanar CT image reconstructions were also generated. COMPARISON:  None. FINDINGS: Osseous: No mandibular fracture or dislocation. No destructive process. Possible small nondisplaced fracture of the superolateral left orbital wall. Orbits: Moderate left periorbital preseptal hematoma. The orbital contents are intact. Sinuses: Partial opacification of the bilateral ethmoid sinuses. Small air-fluid level in the left maxillary sinus. Soft tissues: Left periorbital scalp hematoma. IMPRESSION: 1. Possible small nondisplaced fracture of the superolateral left orbital wall. 2. Moderate left periorbital preseptal hematoma. The orbital contents are intact. 3. Partial opacification of the bilateral ethmoid sinuses. Small air-fluid level in the left maxillary sinus. Electronically Signed   By: Fidela Salisbury M.D.   On: 04/29/2020 12:07    Procedures Procedures (including critical care time)  Medications Ordered in ED Medications  lidocaine-EPINEPHrine (XYLOCAINE W/EPI) 1 %-1:100000 (with pres) injection 10 mL (10 mLs Infiltration Given 04/29/20 1320)  fentaNYL (SUBLIMAZE) injection 50 mcg (50 mcg Intravenous Given 04/29/20 1234)    ED Course  I have reviewed the triage vital signs and the nursing notes.  Pertinent labs & imaging results that were available during my care of the patient were reviewed by me and considered in my medical decision making (see chart for details).    MDM Rules/Calculators/A&P                          Patient is a 86 year old female who presents after an unwitnessed fall.  She presents confused with repetitive questioning.  She does not have any focal neurologic deficits.  She does not remember anything prior to the event.  She does have some traumatic injuries  including a anterior parenchymal/subarachnoid hemorrhage.  I discussed with the radiologist and she feels that this is likely posttraumatic versus aneurysmal bleeding.  She also has a nondisplaced left superior lateral orbital fracture.  She has some limitation in full eye movement although she can move her left eye in all directions.  She has a little bit of limited movement which I feel is likely resulting from the edema.  Given the fracture, it would seem unlikely to have entrapment.  She has a laceration that was repaired by Sharyn Lull, PA.  Her tetanus shot is up-to-date per chart review.  There is no evidence of cervical spine injury.  I have consulted with Dr. Redmond Pulling with trauma surgery he will see the patient.  I also spoke with Dr. Redmond Baseman with ENT who will see the patient.  I spoke with Maudie Mercury with neurosurgery and they will also come see the patient today.  They request a repeat head CT in the morning.  Dr. Redmond Pulling requests a medicine admission.  I spoke with Dr. Lorin Mercy with the hospitalist service who is happy to consult and evaluate the patient for her potential medical event prior to the fall.  She will talk to Dr. Redmond Pulling regarding the admitting service.  It is unclear what led to the fall.  Her EKG does not show any evidence of arrhythmias.  She does not have any focal neurologic deficits.  She does not have any significant lab abnormalities. CRITICAL CARE Performed by: Malvin Johns Total critical care time: 60 minutes Critical care time was exclusive of separately billable procedures and treating other patients. Critical care  was necessary to treat or prevent imminent or life-threatening deterioration. Critical care was time spent personally by me on the following activities: development of treatment plan with patient and/or surrogate as well as nursing, discussions with consultants, evaluation of patient's response to treatment, examination of patient, obtaining history from patient or  surrogate, ordering and performing treatments and interventions, ordering and review of laboratory studies, ordering and review of radiographic studies, pulse oximetry and re-evaluation of patient's condition.  Final Clinical Impression(s) / ED Diagnoses Final diagnoses:  None    Rx / DC Orders ED Discharge Orders    None       Malvin Johns, MD 04/29/20 1343

## 2020-04-29 NOTE — Plan of Care (Signed)
  Problem: Intracerebral Hemorrhage Tissue Perfusion: Goal: Complications of Intracerebral Hemorrhage will be minimized Outcome: Progressing
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
## 2020-04-29 NOTE — Consult Note (Signed)
Reason for Consult: tsah Referring Physician: edp  [name] is an 86 y.o. female.   HPI:  Very pleasant 86 year old comes in today after sustaining a fall at home.  She does not recall all the details of the fall.  She states that she thinks she was bending over to pick something up and fell out of her chair.  However, it was reported that she was found down in a church hallway.  Family brought her to the ED.  She denies any headaches, nausea, vomiting, change in her vision or dizziness.  She is resting quite comfortably in bed right now.  Past Medical History:  Diagnosis Date   Autoimmune disease ([place])    Barrett's esophagus    Bone lesion    sacrum   Breast cancer ([place]) 2010   cT1 N0 M0; ER+/ PR+/ HER2 neg; s/p lumpectomy 07/2010; started adjuvant hormonal therapy 10/2009   Cervical cancer ([place])    Dysphagia    Fx distal radius NEC-closed    Glaucoma    Humerus fracture 03/2012   impacted left humueral neck fracture, treated by Dr. Amedeo Plenty   Hyperlipidemia    Hypertension    Osteoporosis 03/18/2012   Pneumonia    Thyroid disease     Past Surgical History:  Procedure Laterality Date   ABDOMINAL HYSTERECTOMY  1972   cancer          1 tube 1 ovary   APPENDECTOMY     BREAST LUMPECTOMY Left 2010   colon polyp removal     EYE SURGERY     HAND SURGERY     ortho procedures     multiple   OVARIAN CYST REMOVAL  1978   SHOULDER SURGERY  1954   left    Allergies  Allergen Reactions   Daypro [Oxaprozin] Swelling    Face and tongue   Prolia [Denosumab]     unknown    Social History   Tobacco Use   Smoking status: Former Smoker    Quit date: 12/16/1986    Years since quitting: 33.3   Smokeless tobacco: Never Used   Tobacco comment: FORMER SMOKER 30 YEARS AGO  Substance Use Topics   Alcohol use: Yes    Alcohol/week: 0.0 standard drinks    Comment: OCCASIONALLY    Family History  Problem Relation Age of Onset   Heart disease Father     Cancer Sister        lung, kidney   Cancer Brother        brain   Cancer Paternal Aunt        breast   Breast cancer Paternal Aunt    Cancer Brother        liver, lung   Osteoporosis Neg Hx      Review of Systems  Positive ROS: As above  All other systems have been reviewed and were otherwise negative with the exception of those mentioned in the HPI and as above.  Objective: Vital signs in last 24 hours: Temp:  [97.7 F (36.5 C)-97.9 F (36.6 C)] 97.9 F (36.6 C) (07/18 2000) Pulse Rate:  [48-93] 72 (07/18 2000) Resp:  [14-30] 21 (07/18 2000) BP: (126-161)/(55-101) 158/62 (07/18 2000) SpO2:  [99 %-100 %] 100 % (07/18 2000) Weight:  [51.2 kg] 51.2 kg (07/18 1530)  General Appearance: Alert, cooperative, no distress, appears stated age Head: Normocephalic, without obvious abnormality, atraumatic Eyes: PERRL, conjunctiva/corneas clear, EOM's intact, fundi benign, both eyes, ecchymosis around the eyes  Lungs:  respirations unlabored Heart: Regular rate and rhythm Skin: Skin color, texture, turgor normal, various skin lacerations and bruises on her face  NEUROLOGIC:   Mental status: A&O x4, no aphasia, good attention span, Memory and fund of knowledge Motor Exam - grossly normal, normal tone and bulk Sensory Exam - grossly normal Reflexes: symmetric, no pathologic reflexes, No Hoffman's, No clonus Coordination - grossly normal Gait -not tested Balance -not tested Cranial Nerves: I: smell Not tested  II: visual acuity  OS: na   OD: na  II: visual fields Full to confrontation  II: pupils Equal, round, reactive to light  III,VII: ptosis None  III,IV,VI: extraocular muscles  Full ROM  V: mastication Normal  V: facial light touch sensation  Normal  V,VII: corneal reflex  Present  VII: facial muscle function - upper  Normal  VII: facial muscle function - lower Normal  VIII: hearing Not tested  IX: soft palate elevation  Normal  IX,X: gag reflex Present  XI:  trapezius strength  5/5  XI: sternocleidomastoid strength 5/5  XI: neck flexion strength  5/5  XII: tongue strength  Normal    Data Review Lab Results  Component Value Date   WBC 11.3 (H) 04/29/2020   HGB 13.3 04/29/2020   HCT 39.0 04/29/2020   MCV 96.8 04/29/2020   PLT 202 04/29/2020   Lab Results  Component Value Date   NA 140 04/29/2020   K 4.0 04/29/2020   CL 106 04/29/2020   CO2 22 04/29/2020   BUN 19 04/29/2020   CREATININE 1.00 04/29/2020   GLUCOSE 107 (H) 04/29/2020   Lab Results  Component Value Date   INR 0.96 08/24/2014    Radiology: CT Head Wo Contrast  Result Date: 04/29/2020 CLINICAL DATA:  Poly trauma. Loss of consciousness in confusion. EXAM: CT HEAD WITHOUT CONTRAST TECHNIQUE: Contiguous axial images were obtained from the base of the skull through the vertex without intravenous contrast. COMPARISON:  Feb 22, 2019 FINDINGS: Brain: There is no evidence of acute infarction. There is an acute intracranial hemorrhage with bilateral frontal subarachnoid hemorrhage and small amount of right occipital subarachnoid hemorrhage. There is a focus of possible intraparenchymal hemorrhage in the left frontal subcortical gray matter. No evidence of midline shift or herniation. Moderate brain parenchymal volume loss and deep white matter microangiopathy. Vascular: Calcific atherosclerotic disease of the intra cavernous carotid arteries. Skull: Nondisplaced superolateral orbital fracture. Other: Left frontotemporal and periorbital preseptal scalp hematoma. IMPRESSION: 1. Acute intracranial hemorrhage with bilateral frontal subarachnoid hemorrhage and small amount of right occipital subarachnoid hemorrhage. 2. Possible focus of intraparenchymal hemorrhage in the left frontal subcortical gray matter. 3. Left frontotemporal and periorbital preseptal scalp hematoma. 4. Nondisplaced superolateral orbital fracture. Electronically Signed   By: Fidela Salisbury M.D.   On: 04/29/2020 11:53    CT Cervical Spine Wo Contrast  Result Date: 04/29/2020 CLINICAL DATA:  Status post fall. EXAM: CT CERVICAL SPINE WITHOUT CONTRAST TECHNIQUE: Multidetector CT imaging of the cervical spine was performed without intravenous contrast. Multiplanar CT image reconstructions were also generated. COMPARISON:  Cervical spine radiograph and MRI from 2008, report only. FINDINGS: Alignment: Normal. Skull base and vertebrae: No acute fracture. No primary bone lesion or focal pathologic process. Soft tissues and spinal canal: No prevertebral fluid or swelling. No visible canal hematoma. Disc levels: Multilevel osteoarthritic changes of the cervical spine. Upper chest: Biapical pleural calcifications. IMPRESSION: 1. No evidence of acute traumatic injury to the cervical spine. 2. Multilevel osteoarthritic changes of the cervical spine.  Electronically Signed   By: Fidela Salisbury M.D.   On: 04/29/2020 12:11   CT Maxillofacial Wo Contrast  Result Date: 04/29/2020 CLINICAL DATA:  Status post fall with facial trauma. EXAM: CT MAXILLOFACIAL WITHOUT CONTRAST TECHNIQUE: Multidetector CT imaging of the maxillofacial structures was performed. Multiplanar CT image reconstructions were also generated. COMPARISON:  None. FINDINGS: Osseous: No mandibular fracture or dislocation. No destructive process. Possible small nondisplaced fracture of the superolateral left orbital wall. Orbits: Moderate left periorbital preseptal hematoma. The orbital contents are intact. Sinuses: Partial opacification of the bilateral ethmoid sinuses. Small air-fluid level in the left maxillary sinus. Soft tissues: Left periorbital scalp hematoma. IMPRESSION: 1. Possible small nondisplaced fracture of the superolateral left orbital wall. 2. Moderate left periorbital preseptal hematoma. The orbital contents are intact. 3. Partial opacification of the bilateral ethmoid sinuses. Small air-fluid level in the left maxillary sinus. Electronically Signed   By:  Fidela Salisbury M.D.   On: 04/29/2020 12:07    Assessment/Plan: Very pleasant 86 year old presented to the ED tonight after sustaining a fall at church with polytrauma.  She does have some facial fractures.  The fall was unwitnessed.  CT head shows acute intracranial hemorrhage with bilateral frontal subarachnoid hemorrhage and small amount of right occipital subarachnoid hemorrhage.  No surgical intervention is warranted at this time.  These will likely resolve on their own given time. We will get a follow-up head CT in the morning.   Ocie Cornfield [facility] 04/29/2020 9:37 PM
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
## 2020-04-29 NOTE — ED Provider Notes (Signed)
I was asked by Dr. Tamera Punt to perform left upper forehead and left cheek wound closure.  Patient had presented after falling in church.  She had a 2 cm laceration on her left upper forehead, and a 1 cm laceration to her left cheek.  I repaired these no complications, patient tolerated well.  Marland Kitchen.Laceration Repair  Date/Time: 04/29/2020 1:41 PM Performed by: Garald Balding, PA-C Authorized by: Garald Balding, PA-C   Consent:    Consent obtained:  Verbal   Consent given by:  Patient   Risks discussed:  Infection, need for additional repair, pain, poor cosmetic result and poor wound healing   Alternatives discussed:  No treatment and delayed treatment Universal protocol:    Procedure explained and questions answered to patient or proxy's satisfaction: yes     Relevant documents present and verified: yes     Test results available and properly labeled: yes     Imaging studies available: yes     Required blood products, implants, devices, and special equipment available: yes     Site/side marked: yes     Immediately prior to procedure, a time out was called: yes     Patient identity confirmed:  Verbally with patient Anesthesia (see MAR for exact dosages):    Anesthesia method:  Local infiltration   Local anesthetic:  Lidocaine 1% WITH epi Laceration details:    Location:  Face (Left upper forehead and cheek)   Face location:  Forehead   Length (cm):  3   Depth (mm):  1 Repair type:    Repair type:  Simple Pre-procedure details:    Preparation:  Patient was prepped and draped in usual sterile fashion and imaging obtained to evaluate for foreign bodies Exploration:    Hemostasis achieved with:  Direct pressure and epinephrine   Wound exploration: wound explored through full range of motion and entire depth of wound probed and visualized     Wound extent: no foreign bodies/material noted, no muscle damage noted, no underlying fracture noted and no vascular damage noted     Contaminated: no    Treatment:    Area cleansed with:  Betadine and saline   Amount of cleaning:  Extensive   Irrigation solution:  Sterile saline   Irrigation volume:  40CC   Irrigation method:  Syringe Skin repair:    Repair method:  Sutures   Suture size:  5-0   Suture material:  Fast-absorbing gut   Number of sutures:  6 Approximation:    Approximation:  Close Post-procedure details:    Dressing:  Open (no dressing)   Patient tolerance of procedure:  Tolerated well, no immediate complications      Lyndel Safe 04/29/20 1343    Malvin Johns, MD 04/29/20 [phone]

## 2020-04-30 ENCOUNTER — Inpatient Hospital Stay (HOSPITAL_COMMUNITY): Payer: Medicare Other

## 2020-04-30 DIAGNOSIS — R569 Unspecified convulsions: Secondary | ICD-10-CM

## 2020-04-30 DIAGNOSIS — I361 Nonrheumatic tricuspid (valve) insufficiency: Secondary | ICD-10-CM

## 2020-04-30 DIAGNOSIS — I340 Nonrheumatic mitral (valve) insufficiency: Secondary | ICD-10-CM

## 2020-04-30 LAB — CBC
HCT: 35 % — ABNORMAL LOW (ref 36.0–46.0)
Hemoglobin: 11.2 g/dL — ABNORMAL LOW (ref 12.0–15.0)
MCH: 30.4 pg (ref 26.0–34.0)
MCHC: 32 g/dL (ref 30.0–36.0)
MCV: 94.9 fL (ref 80.0–100.0)
Platelets: 212 10*3/uL (ref 150–400)
RBC: 3.69 MIL/uL — ABNORMAL LOW (ref 3.87–5.11)
RDW: 15.3 % (ref 11.5–15.5)
WBC: 12.8 10*3/uL — ABNORMAL HIGH (ref 4.0–10.5)
nRBC: 0 % (ref 0.0–0.2)

## 2020-04-30 LAB — COMPREHENSIVE METABOLIC PANEL
ALT: 23 U/L (ref 0–44)
AST: 31 U/L (ref 15–41)
Albumin: 3 g/dL — ABNORMAL LOW (ref 3.5–5.0)
Alkaline Phosphatase: 72 U/L (ref 38–126)
Anion gap: 9 (ref 5–15)
BUN: 15 mg/dL (ref 8–23)
CO2: 22 mmol/L (ref 22–32)
Calcium: 8.6 mg/dL — ABNORMAL LOW (ref 8.9–10.3)
Chloride: 108 mmol/L (ref 98–111)
Creatinine, Ser: 1.13 mg/dL — ABNORMAL HIGH (ref 0.44–1.00)
GFR calc Af Amer: 51 mL/min — ABNORMAL LOW (ref >60)
GFR calc non Af Amer: 44 mL/min — ABNORMAL LOW (ref >60)
Glucose, Bld: 116 mg/dL — ABNORMAL HIGH (ref 70–99)
Potassium: 4.6 mmol/L (ref 3.5–5.1)
Sodium: 139 mmol/L (ref 135–145)
Total Bilirubin: 0.8 mg/dL (ref 0.3–1.2)
Total Protein: 5.8 g/dL — ABNORMAL LOW (ref 6.5–8.1)

## 2020-04-30 LAB — ECHOCARDIOGRAM COMPLETE
Area-P 1/2: 3.72 cm2
Height: 62 in
S' Lateral: 2.2 cm
Weight: 1806.01 oz

## 2020-04-30 LAB — TSH: TSH: 4.079 u[IU]/mL (ref 0.350–4.500)

## 2020-04-30 MED ORDER — OXYCODONE HCL 5 MG PO TABS
2.5000 mg | ORAL_TABLET | ORAL | Status: DC | PRN
  Administered 2020-05-01 – 2020-05-02 (×3): 5 mg via ORAL
  Administered 2020-05-03: 2.5 mg via ORAL
  Administered 2020-05-04 – 2020-05-05 (×4): 5 mg via ORAL
  Filled 2020-04-30 (×8): qty 1

## 2020-04-30 MED ORDER — MORPHINE SULFATE (PF) 2 MG/ML IV SOLN
1.0000 mg | INTRAVENOUS | Status: DC | PRN
  Administered 2020-04-30: 1 mg via INTRAVENOUS
  Filled 2020-04-30: qty 1

## 2020-04-30 MED ORDER — LEVETIRACETAM 500 MG PO TABS
500.0000 mg | ORAL_TABLET | Freq: Two times a day (BID) | ORAL | Status: DC
  Administered 2020-04-30 – 2020-05-05 (×11): 500 mg via ORAL
  Filled 2020-04-30 (×11): qty 1

## 2020-04-30 MED ORDER — ROSUVASTATIN CALCIUM 5 MG PO TABS
10.0000 mg | ORAL_TABLET | Freq: Every day | ORAL | Status: DC
  Administered 2020-04-30 – 2020-05-05 (×6): 10 mg via ORAL
  Filled 2020-04-30 (×6): qty 2

## 2020-04-30 NOTE — Progress Notes (Addendum)
Subjective: CC: Left forehead pain with palpation Doing well. A&O x 4. Does not remember events leading up to fall. Reports no pain this AM unless she touches her left forehead where she had the laceration repair. No changes in vision. No CP, abdominal pain, sob, n/v, or extremity pain. Has not gotten out of bed. Required straight cath this am. No oral intake yet this am. Lives at home with husband. Has a daughter in [place]. Notes she does not require assistive devices for ADL's or ambulation at home. Walks ~5 miles per day.   ROS: See above, otherwise other systems negative   Objective: Vital signs in last 24 hours: Temp:  [97.7 F (36.5 C)-98.3 F (36.8 C)] 97.9 F (36.6 C) (07/19 0800) Pulse Rate:  [37-93] 57 (07/19 0700) Resp:  [13-30] 19 (07/19 0700) BP: (84-161)/(38-101) 117/59 (07/19 0700) SpO2:  [97 %-100 %] 99 % (07/19 0700) Weight:  [51.2 kg] 51.2 kg (07/18 1530) Last BM Date: 04/30/20  Intake/Output from previous day: 07/18 0701 - 07/19 0700 In: 883.6 [I.V.:883.6] Out: 1350 [Urine:1350] Intake/Output this shift: No intake/output data recorded.  PE: Gen:  Alert, NAD, pleasant HEENT: Bilateral periorbital ecchymosis and swelling. EOM's intact without entrapment. Pupils equal, round and reactive to light. Left forehead and check laceration with sutures in place, c/d/i.  Card:  RRR Pulm:  CTAB, no W/R/R, effort normal Abd: Soft, NT/ND, +BS Ext: Passive rom of hand, wrist, elbow and shoulder bilaterally without pain. No TTP. Passive rom of ankle, knee, and hip bilaterally without pain. No TTP. No LE edema. DP 2+.  Psych: A&Ox4 Neuro: Non-focal. CN 3-12 grossly intact. Moves all extremities.  Skin: no rashes noted, warm and dry  Lab Results:  Recent Labs    04/29/20 1114 04/29/20 1114 04/29/20 1121 04/30/20 0416  WBC 11.3*  --   --  12.8*  HGB 12.2   < > 13.3 11.2*  HCT 39.7   < > 39.0 35.0*  PLT 202  --   --  212   < > = values in this interval not  displayed.   BMET Recent Labs    04/29/20 1114 04/29/20 1114 04/29/20 1121 04/30/20 0416  NA 140   < > 140 139  K 4.1   < > 4.0 4.6  CL 105   < > 106 108  CO2 22  --   --  22  GLUCOSE 116*   < > 107* 116*  BUN 16   < > 19 15  CREATININE 1.06*   < > 1.00 1.13*  CALCIUM 9.6  --   --  8.6*   < > = values in this interval not displayed.   PT/INR No results for input(s): LABPROT, INR in the last 72 hours. CMP     Component Value Date/Time   NA 139 04/30/2020 0416   NA 140 03/18/2013 0900   K 4.6 04/30/2020 0416   K 4.4 03/18/2013 0900   CL 108 04/30/2020 0416   CL 105 03/18/2013 0900   CO2 22 04/30/2020 0416   CO2 25 03/18/2013 0900   GLUCOSE 116 (H) 04/30/2020 0416   GLUCOSE 95 03/18/2013 0900   BUN 15 04/30/2020 0416   BUN 20.2 03/18/2013 0900   CREATININE 1.13 (H) 04/30/2020 0416   CREATININE 1.10 (H) 08/20/2017 1544   CREATININE 1.0 03/18/2013 0900   CALCIUM 8.6 (L) 04/30/2020 0416   CALCIUM 9.2 02/23/2019 0746   CALCIUM 9.6 03/18/2013 0900  PROT 5.8 (L) 04/30/2020 0416   PROT 6.7 03/18/2013 0900   ALBUMIN 3.0 (L) 04/30/2020 0416   ALBUMIN 3.3 (L) 03/18/2013 0900   AST 31 04/30/2020 0416   AST 21 03/18/2013 0900   ALT 23 04/30/2020 0416   ALT 12 03/18/2013 0900   ALKPHOS 72 04/30/2020 0416   ALKPHOS 80 03/18/2013 0900   BILITOT 0.8 04/30/2020 0416   BILITOT 0.36 03/18/2013 0900   GFRNONAA 44 (L) 04/30/2020 0416   GFRNONAA 51 (L) 02/28/2016 0853   GFRAA 51 (L) 04/30/2020 0416   GFRAA 58 (L) 02/28/2016 0853   Lipase     Component Value Date/Time   LIPASE 27 09/08/2017 1126       Studies/Results: CT HEAD WO CONTRAST  Result Date: 04/30/2020 CLINICAL DATA:  Follow-up intracranial hemorrhage.  Trauma. EXAM: CT HEAD WITHOUT CONTRAST TECHNIQUE: Contiguous axial images were obtained from the base of the skull through the vertex without intravenous contrast. COMPARISON:  04/29/2020 FINDINGS: Brain: Small to moderate volume acute subarachnoid hemorrhage,  greatest over the frontal convexities, demonstrates slight interval redistribution but has not significantly changed in overall amount. No definite intraparenchymal or subdural hemorrhage is identified. No acute infarct or midline shift is evident. There is mild cerebral atrophy. Hypodensities in the cerebral white matter bilaterally are unchanged and nonspecific but compatible with chronic small vessel ischemic disease which is mild for age. A 7 mm calcified mass along the tentorium on the left is unchanged and consistent with a small meningioma without significant associated mass effect or brain edema. Vascular: Calcified atherosclerosis at the skull base. No hyperdense vessel. Skull: No depressed skull fracture. Sinuses/Orbits: Bilateral sphenoid and left ethmoid sinus fluid levels. Clear mastoid air cells. Bilateral cataract extraction. Mildly decreased left periorbital soft tissue swelling. Other: None. IMPRESSION: 1. Unchanged subarachnoid hemorrhage. 2. No new intracranial abnormality. Electronically Signed   By: Logan Bores M.D.   On: 04/30/2020 05:49   CT Head Wo Contrast  Result Date: 04/29/2020 CLINICAL DATA:  Poly trauma. Loss of consciousness in confusion. EXAM: CT HEAD WITHOUT CONTRAST TECHNIQUE: Contiguous axial images were obtained from the base of the skull through the vertex without intravenous contrast. COMPARISON:  Feb 22, 2019 FINDINGS: Brain: There is no evidence of acute infarction. There is an acute intracranial hemorrhage with bilateral frontal subarachnoid hemorrhage and small amount of right occipital subarachnoid hemorrhage. There is a focus of possible intraparenchymal hemorrhage in the left frontal subcortical gray matter. No evidence of midline shift or herniation. Moderate brain parenchymal volume loss and deep white matter microangiopathy. Vascular: Calcific atherosclerotic disease of the intra cavernous carotid arteries. Skull: Nondisplaced superolateral orbital fracture.  Other: Left frontotemporal and periorbital preseptal scalp hematoma. IMPRESSION: 1. Acute intracranial hemorrhage with bilateral frontal subarachnoid hemorrhage and small amount of right occipital subarachnoid hemorrhage. 2. Possible focus of intraparenchymal hemorrhage in the left frontal subcortical gray matter. 3. Left frontotemporal and periorbital preseptal scalp hematoma. 4. Nondisplaced superolateral orbital fracture. Electronically Signed   By: Fidela Salisbury M.D.   On: 04/29/2020 11:53   CT Cervical Spine Wo Contrast  Result Date: 04/29/2020 CLINICAL DATA:  Status post fall. EXAM: CT CERVICAL SPINE WITHOUT CONTRAST TECHNIQUE: Multidetector CT imaging of the cervical spine was performed without intravenous contrast. Multiplanar CT image reconstructions were also generated. COMPARISON:  Cervical spine radiograph and MRI from 2008, report only. FINDINGS: Alignment: Normal. Skull base and vertebrae: No acute fracture. No primary bone lesion or focal pathologic process. Soft tissues and spinal canal: No prevertebral fluid or  swelling. No visible canal hematoma. Disc levels: Multilevel osteoarthritic changes of the cervical spine. Upper chest: Biapical pleural calcifications. IMPRESSION: 1. No evidence of acute traumatic injury to the cervical spine. 2. Multilevel osteoarthritic changes of the cervical spine. Electronically Signed   By: Fidela Salisbury M.D.   On: 04/29/2020 12:11   CT Maxillofacial Wo Contrast  Result Date: 04/29/2020 CLINICAL DATA:  Status post fall with facial trauma. EXAM: CT MAXILLOFACIAL WITHOUT CONTRAST TECHNIQUE: Multidetector CT imaging of the maxillofacial structures was performed. Multiplanar CT image reconstructions were also generated. COMPARISON:  None. FINDINGS: Osseous: No mandibular fracture or dislocation. No destructive process. Possible small nondisplaced fracture of the superolateral left orbital wall. Orbits: Moderate left periorbital preseptal hematoma. The  orbital contents are intact. Sinuses: Partial opacification of the bilateral ethmoid sinuses. Small air-fluid level in the left maxillary sinus. Soft tissues: Left periorbital scalp hematoma. IMPRESSION: 1. Possible small nondisplaced fracture of the superolateral left orbital wall. 2. Moderate left periorbital preseptal hematoma. The orbital contents are intact. 3. Partial opacification of the bilateral ethmoid sinuses. Small air-fluid level in the left maxillary sinus. Electronically Signed   By: Fidela Salisbury M.D.   On: 04/29/2020 12:07    Anti-infectives: Anti-infectives (From admission, onward)   None       Assessment/Plan Status post fall, unwitnessed Left, superolateral orbital fracture - EDP consulted Dr. Redmond Baseman on ENT. No entrapment on exam Periorbital ecchymosis and swelling - Ice Bilateral subarachnoid hemorrhage, intraparenchymal hemorrhage - Per Neurosurgery, Dr. Ronnald Ramp. CTH stable this AM. Start Keppra BID for seizure prophylaxis. TBI therapies.  History of falls -  Blood sugar normal on presentation. On cardiac monitoring. EKG on admission NSR. Labs overall unremarkable. No urinary symptoms reported but having to get straight cathed. UA/UCx pending. TRH consulted for unknown circumstances of fall. Obtaining Echo and EEG this AM. Recommending delayed MRI of the brain when SAH/IPH stabilized. Recommends neuropsychiatric as outpatient.  Left forehead/cheek laceration - s/p repair in ED by EDP History of breast cancer CKD - Baseline ~1.06. Cr 1.13. On IVF. Trend.  Hx HTN - Hold home meds for now. PRN Hydralazine  Hx HLD - Continue home meds Hx Hypothyroidism - TSH wnl. Continue home meds FEN - HH, MIVF VTE - SCDs, Lovenox on hold until cleared by NSGY ID - None F[place] - [place] currently. Required straight cath overnight. Monitor.  Dispo - Transfer to [facility]. TBI therapies. Lives at home with her husband. Daughter lives in [place].     LOS: 1 day    Jillyn Ledger ,  [facility] Surgery 04/30/2020, 9:59 AM Please see Amion for pager number during day hours 7:00am-4:30pm

## 2020-04-30 NOTE — Procedures (Signed)
Patient Name: [name]  MRN: [id number]  Epilepsy Attending: Lora Havens  Referring Physician/Provider: Dr Karmen Bongo Date: 04/30/2020 Duration: 24.55 mins  Patient history: 86yo F with repeated falls and now with SAH. EEG to evaluate for seizure.   Level of alertness: Awake, drowsy  AEDs during EEG study: LEV  Technical aspects: This EEG study was done with scalp electrodes positioned according to the 10-20 International system of electrode placement. Electrical activity was acquired at a sampling rate of 500Hz  and reviewed with a high frequency filter of 70Hz  and a low frequency filter of 1Hz . EEG data were recorded continuously and digitally stored.   Description: The posterior dominant rhythm consists of 9 Hz activity of moderate voltage (25-35 uV) seen predominantly in posterior head regions, symmetric and reactive to eye opening and eye closing. Drowsiness was characterized by attenuation of the posterior background rhythm. Physiology photic driving was seen during photic stimulation.  Hyperventilation was not performed.     IMPRESSION: This study is within normal limits. No seizures or epileptiform discharges were seen throughout the recording.  [name] Name Barbra Sarks

## 2020-04-30 NOTE — Progress Notes (Signed)
EEG complete - results pending 

## 2020-04-30 NOTE — Progress Notes (Signed)
Occupational Therapy Evaluation Patient Details Name: [name] MRN: [id number] DOB: 07/14/1931 Today's Date: 04/30/2020    History of Present Illness 86 y.o. female with medical history significant of L breast cancer s/p chemo/rads in 2010/2011; HTN; hypothyroidism; and HLD presenting after an unwitnessed fall at church. She has a prior h/o delusions and paranoid thinking in early 2020.  She was subsequently hospitalized for elevated BP (possibly related to anxiety) and had an unremarkable MRI.  More recently, she reported difficulty with transposition of numbers and possible hemianopsia of the left eye with ?neglect but without vision loss (20/20 vision since cataract surgery).  She may have mild baseline cognitive impairment but also has unawareness of her surroundings at times - gets lost while driving, falls frequently while not paying attention.    Clinical Impression   PTA, pt lived at home with her husband, enjoyed hiking, going to [place] and crossword puzzles and was independent with mobility and ADL tasks. Pt drives and is independent with IADL tasks, I.e. cooking, medication and financial management. Pt alert and oriented however demonstrates impulsivity and deficits regarding insight, reasoning and judgement in addition to apparent STM deficits. Tangential at times during session and requires VC to redirect to question. VC during ADL to locate items in L visual field. Required Mod A at times during dynamic ADL task to recover from loss of balance to prevent fall. Demonstrated decreased safety awareness during episodes. Pt has had multiple falls in past. Pt demonstrates a significant decline in functional status due to below deficits. Feel pt would benefit from intense short term rehab at [facility] to facilitate safe DC home with elderly husband.  Will follow acutely.   BP supine 118/61 Sitting EOB 146/77 After activity 150/99  After returning to recliner from bathroom, pt began to complain  of dizziness and began to tremble/shake and states "My head is spinning". Nsg called. Pt not orthostatic. After several minutes pt reports feeling "better"    Follow Up Recommendations  CIR;Supervision/Assistance - 24 hour    Equipment Recommendations  3 in 1 bedside commode    Recommendations for Other Services Rehab consult     Precautions / Restrictions Precautions Precautions: Fall      Mobility Bed Mobility Overal bed mobility: Needs Assistance Bed Mobility: Supine to Sit     Supine to sit: Min guard     General bed mobility comments: moving impulsively; initially unsteady  Transfers Overall transfer level: Needs assistance   Transfers: Sit to/from Stand;Stand Pivot Transfers Sit to Stand: Min assist Stand pivot transfers: Min assist       General transfer comment: mod A with mobility at times due to LOB    Balance Overall balance assessment: History of Falls;Needs assistance Sitting-balance support: Feet supported Sitting balance-Leahy Scale: Fair       Standing balance-Leahy Scale: Poor  reliant on external support                           ADL either performed or assessed with clinical judgement   ADL Overall ADL's : Needs assistance/impaired Eating/Feeding: Set up;Sitting   Grooming: Minimal assistance;Standing Grooming Details (indicate cue type and reason): vc to locate items at times during ADL; pt cued to use washcloth to wash face instead of splashing water on face with hands; Pts states "I would but I don't have a washcloth" - pt's hand was on the washcloth (L field); VC to locate soap dispenser in L field; cues  to turn off water Upper Body Bathing: Set up;Supervision/ safety;Sitting   Lower Body Bathing: Minimal assistance;Sit to/from stand   Upper Body Dressing : Set up;Supervision/safety;Sitting   Lower Body Dressing: Minimal assistance;Sit to/from stand   Toilet Transfer: Moderate assistance;Ambulation Toilet Transfer  Details (indicate cue type and reason): Began with HHA min A however due to LOB x 3, pt required Mod A Toileting- Clothing Manipulation and Hygiene: Min guard;Sit to/from stand       Functional mobility during ADLs: Moderate assistance (HHA; may do well with RW) General ADL Comments: impulsive at times; LOB during dynamic tasks     Vision Baseline Vision/History: Glaucoma;Wears glasses Wears Glasses: Reading only Vision Assessment?: Yes Eye Alignment: Within Functional Limits Ocular Range of Motion: Within Functional Limits Alignment/Gaze Preference: Within Defined Limits Tracking/Visual Pursuits: Able to track stimulus in all quads without difficulty Saccades: Within functional limits Convergence: Within functional limits Visual Fields: Other (comment);Left visual field deficit (Pt required VC to locate items on sink in L field) Additional Comments: Will further assess; new diagnosis of glaucoma     Perception Perception Comments: appears intact; will further assess; daughter reports hx of recent spatial problems regarding telling time; number transposition; daughter reports hx of delusions last year - thought people were following her home from the park   Praxis Praxis Praxis-Other Comments: appears intact; will  further assess    Pertinent Vitals/Pain Pain Assessment: Faces Faces Pain Scale: Hurts little more Pain Location: face/head Pain Descriptors / Indicators: Discomfort;Grimacing Pain Intervention(s): Limited activity within patient's tolerance     Hand Dominance Right   Extremity/Trunk Assessment Upper Extremity Assessment Upper Extremity Assessment: Generalized weakness (BUE fxs from prior falls; using functionally)   Lower Extremity Assessment Lower Extremity Assessment: Defer to PT evaluation   Cervical / Trunk Assessment Cervical / Trunk Assessment: Other exceptions (hx of back pain)   Communication Communication Communication: No difficulties    Cognition Arousal/Alertness: Awake/alert Behavior During Therapy: Impulsive;Anxious Overall Cognitive Status: Impaired/Different from baseline Area of Impairment: Attention;Memory;Safety/judgement;Awareness;Problem solving                   Current Attention Level: Sustained Memory: Decreased recall of precautions;Decreased short-term memory (decreased delayed recall - able to recall 1/3 words )   Safety/Judgement: Decreased awareness of safety;Decreased awareness of deficits Awareness: Emergent Problem Solving: Slow processing General Comments: Pt most likely impulsive at [place] perconservation wtih daughter; cognition appears worse than baseline; pt educated to not stand without assist, pt stood impulsively; tangential at times; LOB x 3 in bathroom during ADL, pt with decreased awareness of impact of balance deficits on safety at times   General Comments       Exercises     Shoulder Instructions      Home Living Family/patient expects to be discharged to:: Private residence Living Arrangements: Spouse/significant other Available Help at Discharge: Family;Available 24 hours/day Type of Home: House Home Access: Stairs to enter [employer] of Steps: 4-5 Entrance Stairs-Rails: Right;Left;Can reach both Home Layout: One level     Bathroom Shower/Tub: Tub/shower unit;Walk-in shower   Bathroom Toilet: Handicapped height Bathroom Accessibility: Yes How Accessible: Accessible via walker Home Equipment: Shower seat - built in;Grab bars - tub/shower          Prior Functioning/Environment Level of Independence: Independent        Comments: drives; active; enjoys hhiking; has had several falls        OT Problem List: Decreased strength;Decreased activity tolerance;Impaired balance (sitting and/or standing);Impaired vision/perception;Decreased cognition;Decreased  safety awareness;Decreased knowledge of use of DME or AE;Pain      OT  Treatment/Interventions: Self-care/ADL training;Therapeutic exercise;DME and/or AE instruction;Therapeutic activities;Cognitive remediation/compensation;Visual/perceptual remediation/compensation;Balance training;Patient/family education    OT Goals(Current goals can be found in the care plan section) Acute Rehab OT Goals Patient Stated Goal: to get better and go home OT Goal Formulation: With patient Time For Goal Achievement: 05/14/20 Potential to Achieve Goals: Good  OT Frequency: Min 2X/week   Barriers to D/C:            Co-evaluation              AM-PAC OT "6 Clicks" Daily Activity     Outcome Measure Help from another person eating meals?: A Little Help from another person taking care of personal grooming?: A Little Help from another person toileting, which includes using toliet, bedpan, or urinal?: A Lot Help from another person bathing (including washing, rinsing, drying)?: A Little Help from another person to put on and taking off regular upper body clothing?: A Little Help from another person to put on and taking off regular lower body clothing?: A Little 6 Click Score: 17   End of Session Equipment Utilized During Treatment: Gait belt Nurse Communication: Mobility status;Other (comment) (pt shaking)  Activity Tolerance: Patient tolerated treatment well Patient left: in chair;with call bell/phone within reach;with chair alarm set;with family/visitor present  OT Visit Diagnosis: Unsteadiness on feet (R26.81);Other abnormalities of gait and mobility (R26.89);History of falling (Z91.81);Muscle weakness (generalized) (M62.81);Low vision, both eyes (H54.2);Other symptoms and signs involving cognitive function;Pain;Dizziness and giddiness (R42) Pain - part of body:  (face/head)                Time: 4481-8563 OT Time Calculation (min): 51 min Charges:  OT General Charges $OT Visit: 1 Visit OT Evaluation $OT Eval Moderate Complexity: 1 Mod OT Treatments $Self  Care/Home Management : 23-37 mins  Maurie Boettcher, OT/L   Acute OT Clinical Specialist [place] Pager [phone] Office [phone]   [facility] 04/30/2020, 4:50 PM
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
## 2020-04-30 NOTE — Progress Notes (Signed)
Subjective: Patient reports some very mild headache if she presses on her head, otherwise feels fine.  Objective: Vital signs in last 24 hours: Temp:  [97.9 F (36.6 C)-98.3 F (36.8 C)] 97.9 F (36.6 C) (07/19 0800) Pulse Rate:  [37-93] 61 (07/19 1000) Resp:  [13-30] 18 (07/19 1000) BP: (84-161)/(38-98) 135/73 (07/19 1000) SpO2:  [97 %-100 %] 99 % (07/19 1000) Weight:  [51.2 kg] 51.2 kg (07/18 1530)  Intake/Output from previous day: 07/18 0701 - 07/19 0700 In: 883.6 [I.V.:883.6] Out: 1350 [Urine:1350] Intake/Output this shift: No intake/output data recorded.  Neurologic: Grossly normal  Lab Results: Lab Results  Component Value Date   WBC 12.8 (H) 04/30/2020   HGB 11.2 (L) 04/30/2020   HCT 35.0 (L) 04/30/2020   MCV 94.9 04/30/2020   PLT 212 04/30/2020   Lab Results  Component Value Date   INR 0.96 08/24/2014   BMET Lab Results  Component Value Date   NA 139 04/30/2020   K 4.6 04/30/2020   CL 108 04/30/2020   CO2 22 04/30/2020   GLUCOSE 116 (H) 04/30/2020   BUN 15 04/30/2020   CREATININE 1.13 (H) 04/30/2020   CALCIUM 8.6 (L) 04/30/2020    Studies/Results: CT HEAD WO CONTRAST  Result Date: 04/30/2020 CLINICAL DATA:  Follow-up intracranial hemorrhage.  Trauma. EXAM: CT HEAD WITHOUT CONTRAST TECHNIQUE: Contiguous axial images were obtained from the base of the skull through the vertex without intravenous contrast. COMPARISON:  04/29/2020 FINDINGS: Brain: Small to moderate volume acute subarachnoid hemorrhage, greatest over the frontal convexities, demonstrates slight interval redistribution but has not significantly changed in overall amount. No definite intraparenchymal or subdural hemorrhage is identified. No acute infarct or midline shift is evident. There is mild cerebral atrophy. Hypodensities in the cerebral white matter bilaterally are unchanged and nonspecific but compatible with chronic small vessel ischemic disease which is mild for age. A 7 mm calcified  mass along the tentorium on the left is unchanged and consistent with a small meningioma without significant associated mass effect or brain edema. Vascular: Calcified atherosclerosis at the skull base. No hyperdense vessel. Skull: No depressed skull fracture. Sinuses/Orbits: Bilateral sphenoid and left ethmoid sinus fluid levels. Clear mastoid air cells. Bilateral cataract extraction. Mildly decreased left periorbital soft tissue swelling. Other: None. IMPRESSION: 1. Unchanged subarachnoid hemorrhage. 2. No new intracranial abnormality. Electronically Signed   By: Logan Bores M.D.   On: 04/30/2020 05:49   CT Head Wo Contrast  Result Date: 04/29/2020 CLINICAL DATA:  Poly trauma. Loss of consciousness in confusion. EXAM: CT HEAD WITHOUT CONTRAST TECHNIQUE: Contiguous axial images were obtained from the base of the skull through the vertex without intravenous contrast. COMPARISON:  Feb 22, 2019 FINDINGS: Brain: There is no evidence of acute infarction. There is an acute intracranial hemorrhage with bilateral frontal subarachnoid hemorrhage and small amount of right occipital subarachnoid hemorrhage. There is a focus of possible intraparenchymal hemorrhage in the left frontal subcortical gray matter. No evidence of midline shift or herniation. Moderate brain parenchymal volume loss and deep white matter microangiopathy. Vascular: Calcific atherosclerotic disease of the intra cavernous carotid arteries. Skull: Nondisplaced superolateral orbital fracture. Other: Left frontotemporal and periorbital preseptal scalp hematoma. IMPRESSION: 1. Acute intracranial hemorrhage with bilateral frontal subarachnoid hemorrhage and small amount of right occipital subarachnoid hemorrhage. 2. Possible focus of intraparenchymal hemorrhage in the left frontal subcortical gray matter. 3. Left frontotemporal and periorbital preseptal scalp hematoma. 4. Nondisplaced superolateral orbital fracture. Electronically Signed   By: Fidela Salisbury M.D.   On:  04/29/2020 11:53   CT Cervical Spine Wo Contrast  Result Date: 04/29/2020 CLINICAL DATA:  Status post fall. EXAM: CT CERVICAL SPINE WITHOUT CONTRAST TECHNIQUE: Multidetector CT imaging of the cervical spine was performed without intravenous contrast. Multiplanar CT image reconstructions were also generated. COMPARISON:  Cervical spine radiograph and MRI from 2008, report only. FINDINGS: Alignment: Normal. Skull base and vertebrae: No acute fracture. No primary bone lesion or focal pathologic process. Soft tissues and spinal canal: No prevertebral fluid or swelling. No visible canal hematoma. Disc levels: Multilevel osteoarthritic changes of the cervical spine. Upper chest: Biapical pleural calcifications. IMPRESSION: 1. No evidence of acute traumatic injury to the cervical spine. 2. Multilevel osteoarthritic changes of the cervical spine. Electronically Signed   By: Fidela Salisbury M.D.   On: 04/29/2020 12:11   EEG adult  Result Date: 04/30/2020 Lora Havens, MD     04/30/2020 11:00 AM Patient Name: [name] MRN: [id number] Epilepsy Attending: Lora Havens Referring Physician/Provider: Dr Karmen Bongo Date: 04/30/2020 Duration: 24.55 mins Patient history: 86yo F with repeated falls and now with [facility]. EEG to evaluate for seizure. Level of alertness: Awake, drowsy AEDs during EEG study: LEV Technical aspects: This EEG study was done with scalp electrodes positioned according to the 10-20 International system of electrode placement. Electrical activity was acquired at a sampling rate of 500Hz  and reviewed with a high frequency filter of 70Hz  and a low frequency filter of 1Hz . EEG data were recorded continuously and digitally stored. Description: The posterior dominant rhythm consists of 9 Hz activity of moderate voltage (25-35 uV) seen predominantly in posterior head regions, symmetric and reactive to eye opening and eye closing. Drowsiness was characterized by attenuation  of the posterior background rhythm. Physiology photic driving was seen during photic stimulation.  Hyperventilation was not performed.   IMPRESSION: This study is within normal limits. No seizures or epileptiform discharges were seen throughout the recording. Lora Havens   CT Maxillofacial Wo Contrast  Result Date: 04/29/2020 CLINICAL DATA:  Status post fall with facial trauma. EXAM: CT MAXILLOFACIAL WITHOUT CONTRAST TECHNIQUE: Multidetector CT imaging of the maxillofacial structures was performed. Multiplanar CT image reconstructions were also generated. COMPARISON:  None. FINDINGS: Osseous: No mandibular fracture or dislocation. No destructive process. Possible small nondisplaced fracture of the superolateral left orbital wall. Orbits: Moderate left periorbital preseptal hematoma. The orbital contents are intact. Sinuses: Partial opacification of the bilateral ethmoid sinuses. Small air-fluid level in the left maxillary sinus. Soft tissues: Left periorbital scalp hematoma. IMPRESSION: 1. Possible small nondisplaced fracture of the superolateral left orbital wall. 2. Moderate left periorbital preseptal hematoma. The orbital contents are intact. 3. Partial opacification of the bilateral ethmoid sinuses. Small air-fluid level in the left maxillary sinus. Electronically Signed   By: Fidela Salisbury M.D.   On: 04/29/2020 12:07    Assessment/Plan: Very sweet 89 year who sustained a fall and tSAH. CT head this morning was stable. No new nsgy recom. We will sign off at this time. Follow up in the office with [place] in 2 weeks. Please call with any neurologic changes.     LOS: 1 day    Ocie Cornfield Berman Grainger 04/30/2020, 11:50 AM

## 2020-04-30 NOTE — Progress Notes (Signed)
Echocardiogram 2D Echocardiogram has been performed.  Oneal Deputy Yaneliz Radebaugh 04/30/2020, 9:38 AM

## 2020-04-30 NOTE — Consult Note (Signed)
Reason for Consult: Facial trauma Referring Physician: Trauma  [name] is an 86 y.o. female.  HPI: 86 year old female had an unwitnessed fall at her [employer] yesterday and was brought to the ER and found to have a subarachnoid hemorrhage and left orbital fracture.  She was admitted for observation.  She has no eye complaints such as diplopia.  Past Medical History:  Diagnosis Date  . Autoimmune disease ([place])   . Barrett's esophagus   . Bone lesion    sacrum  . Breast cancer ([place]) 2010   cT1 N0 M0; ER+/ PR+/ HER2 neg; s/p lumpectomy 07/2010; started adjuvant hormonal therapy 10/2009  . Cervical cancer ([place])   . Dysphagia   . Fx distal radius NEC-closed   . Glaucoma   . Humerus fracture 03/2012   impacted left humueral neck fracture, treated by Dr. Amedeo Plenty  . Hyperlipidemia   . Hypertension   . Osteoporosis 03/18/2012  . Pneumonia   . Thyroid disease     Past Surgical History:  Procedure Laterality Date  . ABDOMINAL HYSTERECTOMY  1972   cancer          1 tube 1 ovary  . APPENDECTOMY    . BREAST LUMPECTOMY Left 2010  . colon polyp removal    . EYE SURGERY    . HAND SURGERY    . ortho procedures     multiple  . OVARIAN CYST REMOVAL  1978  . SHOULDER SURGERY  1954   left    Family History  Problem Relation Age of Onset  . Heart disease Father   . Cancer Sister        lung, kidney  . Cancer Brother        brain  . Cancer Paternal Aunt        breast  . Breast cancer Paternal Aunt   . Cancer Brother        liver, lung  . Osteoporosis Neg Hx     Social History:  reports that she quit smoking about 33 years ago. She has never used smokeless tobacco. She reports current alcohol use. She reports that she does not use drugs.  Allergies:  Allergies  Allergen Reactions  . Daypro [Oxaprozin] Swelling    Face and tongue  . Prolia [Denosumab]     unknown    Medications: I have reviewed the patient's current medications.  Results for orders placed or performed  during the hospital encounter of 04/29/20 (from the past 48 hour(s))  Comprehensive metabolic panel     Status: Abnormal   Collection Time: 04/29/20 11:14 AM  Result Value Ref Range   Sodium 140 135 - 145 mmol/L   Potassium 4.1 3.5 - 5.1 mmol/L   Chloride 105 98 - 111 mmol/L   CO2 22 22 - 32 mmol/L   Glucose, Bld 116 (H) 70 - 99 mg/dL    Comment: Glucose reference range applies only to samples taken after fasting for at least 8 hours.   BUN 16 8 - 23 mg/dL   Creatinine, Ser 1.06 (H) 0.44 - 1.00 mg/dL   Calcium 9.6 8.9 - 10.3 mg/dL   Total Protein 6.5 6.5 - 8.1 g/dL   Albumin 3.5 3.5 - 5.0 g/dL   AST 37 15 - 41 U/L   ALT 26 0 - 44 U/L   Alkaline Phosphatase 81 38 - 126 U/L   Total Bilirubin 0.8 0.3 - 1.2 mg/dL   GFR calc non Af Amer 47 (L) >60 mL/min  GFR calc Af Amer 55 (L) >60 mL/min   Anion gap 13 5 - 15    Comment: Performed at [facility] [address], [place], [place] [address]  CBC with Differential     Status: Abnormal   Collection Time: 04/29/20 11:14 AM  Result Value Ref Range   WBC 11.3 (H) 4.0 - 10.5 K/uL   RBC 4.10 3.87 - 5.11 MIL/uL   Hemoglobin 12.2 12.0 - 15.0 g/dL   HCT 39.7 36 - 46 %   MCV 96.8 80.0 - 100.0 fL   MCH 29.8 26.0 - 34.0 pg   MCHC 30.7 30.0 - 36.0 g/dL   RDW 15.2 11.5 - 15.5 %   Platelets 202 150 - 400 K/uL   nRBC 0.0 0.0 - 0.2 %   Neutrophils Relative % 77 %   Neutro Abs 8.8 (H) 1.7 - 7.7 K/uL   Lymphocytes Relative 13 %   Lymphs Abs 1.5 0.7 - 4.0 K/uL   Monocytes Relative 6 %   Monocytes Absolute 0.7 0 - 1 K/uL   Eosinophils Relative 1 %   Eosinophils Absolute 0.2 0 - 0 K/uL   Basophils Relative 1 %   Basophils Absolute 0.1 0 - 0 K/uL   Immature Granulocytes 2 %   Abs Immature Granulocytes 0.17 (H) 0.00 - 0.07 K/uL    Comment: Performed at [facility] [address] [place], [place] [address]  I-stat chem 8, ED (not at [facility] or [facility])     Status: Abnormal   Collection Time: 04/29/20 11:21 AM  Result Value Ref Range    Sodium 140 135 - 145 mmol/L   Potassium 4.0 3.5 - 5.1 mmol/L   Chloride 106 98 - 111 mmol/L   BUN 19 8 - 23 mg/dL   Creatinine, Ser 1.00 0.44 - 1.00 mg/dL   Glucose, Bld 107 (H) 70 - 99 mg/dL    Comment: Glucose reference range applies only to samples taken after fasting for at least 8 hours.   Calcium, Ion 1.08 (L) 1.15 - 1.40 mmol/L   TCO2 20 (L) 22 - 32 mmol/L   Hemoglobin 13.3 12.0 - 15.0 g/dL   HCT 39.0 36 - 46 %  CBG monitoring, ED     Status: Abnormal   Collection Time: 04/29/20 12:50 PM  Result Value Ref Range   Glucose-Capillary 123 (H) 70 - 99 mg/dL    Comment: Glucose reference range applies only to samples taken after fasting for at least 8 hours.  SARS Coronavirus 2 by RT PCR (hospital order, performed in [facility] hospital lab) Nasopharyngeal Nasopharyngeal Swab     Status: None   Collection Time: 04/29/20  2:22 PM   Specimen: Nasopharyngeal Swab  Result Value Ref Range   SARS Coronavirus 2 NEGATIVE NEGATIVE    Comment: (NOTE) SARS-CoV-2 target nucleic acids are NOT DETECTED.  The SARS-CoV-2 RNA is generally detectable in upper and lower respiratory specimens during the acute phase of infection. The lowest concentration of SARS-CoV-2 viral copies this assay can detect is 250 copies / mL. A negative result does not preclude SARS-CoV-2 infection and should not be used as the sole basis for treatment or other patient management decisions.  A negative result may occur with improper specimen collection / handling, submission of specimen other than nasopharyngeal swab, presence of viral mutation(s) within the areas targeted by this assay, and inadequate number of viral copies (<250 copies / mL). A negative result must be combined with clinical observations,  patient history, and epidemiological information.  Fact Sheet for Patients:   StrictlyIdeas.no  Fact Sheet for Healthcare Providers: BankingDealers.co.za  This test  is not yet approved or  cleared by the [place] FDA and has been authorized for detection and/or diagnosis of SARS-CoV-2 by FDA under an Emergency Use Authorization (EUA).  This EUA will remain in effect (meaning this test can be used) for the duration of the COVID-19 declaration under Section 564(b)(1) of the Act, 21 U.S.C. section 360bbb-3(b)(1), unless the authorization is terminated or revoked sooner.  Performed at [facility], [place] [address], [place], [place] [address]   MRSA PCR Screening     Status: None   Collection Time: 04/29/20  3:31 PM   Specimen: Nasal Mucosa; Nasopharyngeal  Result Value Ref Range   MRSA by PCR NEGATIVE NEGATIVE    Comment:        The GeneXpert MRSA Assay (FDA approved for NASAL specimens only), is one component of a comprehensive MRSA colonization surveillance program. It is not intended to diagnose MRSA infection nor to guide or monitor treatment for MRSA infections. Performed at [facility], [place] [address], [place], [place] [address]   CBC     Status: Abnormal   Collection Time: 04/30/20  4:16 AM  Result Value Ref Range   WBC 12.8 (H) 4.0 - 10.5 K/uL   RBC 3.69 (L) 3.87 - 5.11 MIL/uL   Hemoglobin 11.2 (L) 12.0 - 15.0 g/dL   HCT 35.0 (L) 36 - 46 %   MCV 94.9 80.0 - 100.0 fL   MCH 30.4 26.0 - 34.0 pg   MCHC 32.0 30.0 - 36.0 g/dL   RDW 15.3 11.5 - 15.5 %   Platelets 212 150 - 400 K/uL   nRBC 0.0 0.0 - 0.2 %    Comment: Performed at [facility], [place] [address] [place], [place] [address]  Comprehensive metabolic panel     Status: Abnormal   Collection Time: 04/30/20  4:16 AM  Result Value Ref Range   Sodium 139 135 - 145 mmol/L   Potassium 4.6 3.5 - 5.1 mmol/L   Chloride 108 98 - 111 mmol/L   CO2 22 22 - 32 mmol/L   Glucose, Bld 116 (H) 70 - 99 mg/dL    Comment: Glucose reference range applies only to samples taken after fasting for at least 8 hours.   BUN 15 8 - 23 mg/dL   Creatinine, Ser 1.13 (H) 0.44 - 1.00  mg/dL   Calcium 8.6 (L) 8.9 - 10.3 mg/dL   Total Protein 5.8 (L) 6.5 - 8.1 g/dL   Albumin 3.0 (L) 3.5 - 5.0 g/dL   AST 31 15 - 41 U/L   ALT 23 0 - 44 U/L   Alkaline Phosphatase 72 38 - 126 U/L   Total Bilirubin 0.8 0.3 - 1.2 mg/dL   GFR calc non Af Amer 44 (L) >60 mL/min   GFR calc Af Amer 51 (L) >60 mL/min   Anion gap 9 5 - 15    Comment: Performed at [facility] [address], [place], [place] [address]  TSH     Status: None   Collection Time: 04/30/20  4:16 AM  Result Value Ref Range   TSH 4.079 0.350 - 4.500 uIU/mL    Comment: Performed by a 3rd Generation assay with a functional sensitivity of <=0.01 uIU/mL. Performed at [facility], [place] [address], [place], [place] [address]  CT HEAD WO CONTRAST  Result Date: 04/30/2020 CLINICAL DATA:  Follow-up intracranial hemorrhage.  Trauma. EXAM: CT HEAD WITHOUT CONTRAST TECHNIQUE: Contiguous axial images were obtained from the base of the skull through the vertex without intravenous contrast. COMPARISON:  04/29/2020 FINDINGS: Brain: Small to moderate volume acute subarachnoid hemorrhage, greatest over the frontal convexities, demonstrates slight interval redistribution but has not significantly changed in overall amount. No definite intraparenchymal or subdural hemorrhage is identified. No acute infarct or midline shift is evident. There is mild cerebral atrophy. Hypodensities in the cerebral white matter bilaterally are unchanged and nonspecific but compatible with chronic small vessel ischemic disease which is mild for age. A 7 mm calcified mass along the tentorium on the left is unchanged and consistent with a small meningioma without significant associated mass effect or brain edema. Vascular: Calcified atherosclerosis at the skull base. No hyperdense vessel. Skull: No depressed skull fracture. Sinuses/Orbits: Bilateral sphenoid and left ethmoid sinus fluid levels. Clear mastoid air cells. Bilateral cataract extraction.  Mildly decreased left periorbital soft tissue swelling. Other: None. IMPRESSION: 1. Unchanged subarachnoid hemorrhage. 2. No new intracranial abnormality. Electronically Signed   By: Logan Bores M.D.   On: 04/30/2020 05:49   CT Head Wo Contrast  Result Date: 04/29/2020 CLINICAL DATA:  Poly trauma. Loss of consciousness in confusion. EXAM: CT HEAD WITHOUT CONTRAST TECHNIQUE: Contiguous axial images were obtained from the base of the skull through the vertex without intravenous contrast. COMPARISON:  Feb 22, 2019 FINDINGS: Brain: There is no evidence of acute infarction. There is an acute intracranial hemorrhage with bilateral frontal subarachnoid hemorrhage and small amount of right occipital subarachnoid hemorrhage. There is a focus of possible intraparenchymal hemorrhage in the left frontal subcortical gray matter. No evidence of midline shift or herniation. Moderate brain parenchymal volume loss and deep white matter microangiopathy. Vascular: Calcific atherosclerotic disease of the intra cavernous carotid arteries. Skull: Nondisplaced superolateral orbital fracture. Other: Left frontotemporal and periorbital preseptal scalp hematoma. IMPRESSION: 1. Acute intracranial hemorrhage with bilateral frontal subarachnoid hemorrhage and small amount of right occipital subarachnoid hemorrhage. 2. Possible focus of intraparenchymal hemorrhage in the left frontal subcortical gray matter. 3. Left frontotemporal and periorbital preseptal scalp hematoma. 4. Nondisplaced superolateral orbital fracture. Electronically Signed   By: Fidela Salisbury M.D.   On: 04/29/2020 11:53   CT Cervical Spine Wo Contrast  Result Date: 04/29/2020 CLINICAL DATA:  Status post fall. EXAM: CT CERVICAL SPINE WITHOUT CONTRAST TECHNIQUE: Multidetector CT imaging of the cervical spine was performed without intravenous contrast. Multiplanar CT image reconstructions were also generated. COMPARISON:  Cervical spine radiograph and MRI from 2008,  report only. FINDINGS: Alignment: Normal. Skull base and vertebrae: No acute fracture. No primary bone lesion or focal pathologic process. Soft tissues and spinal canal: No prevertebral fluid or swelling. No visible canal hematoma. Disc levels: Multilevel osteoarthritic changes of the cervical spine. Upper chest: Biapical pleural calcifications. IMPRESSION: 1. No evidence of acute traumatic injury to the cervical spine. 2. Multilevel osteoarthritic changes of the cervical spine. Electronically Signed   By: Fidela Salisbury M.D.   On: 04/29/2020 12:11   EEG adult  Result Date: 04/30/2020 Lora Havens, MD     04/30/2020 11:00 AM Patient Name: [name] MRN: [id number] Epilepsy Attending: Lora Havens Referring Physician/Provider: Dr Karmen Bongo Date: 04/30/2020 Duration: 24.55 mins Patient history: 86yo F with repeated falls and now with [facility]. EEG to evaluate for seizure. Level of alertness: Awake, drowsy AEDs during EEG study: LEV Technical aspects: This EEG  study was done with scalp electrodes positioned according to the 10-20 International system of electrode placement. Electrical activity was acquired at a sampling rate of _0  and reviewed with a high frequency filter of _1  and a low frequency filter of _2 . EEG data were recorded continuously and digitally stored. Description: The posterior dominant rhythm consists of 9 Hz activity of moderate voltage (25-35 uV) seen predominantly in posterior head regions, symmetric and reactive to eye opening and eye closing. Drowsiness was characterized by attenuation of the posterior background rhythm. Physiology photic driving was seen during photic stimulation.  Hyperventilation was not performed.   IMPRESSION: This study is within normal limits. No seizures or epileptiform discharges were seen throughout the recording. Lora Havens   ECHOCARDIOGRAM COMPLETE  Result Date: 04/30/2020    ECHOCARDIOGRAM REPORT   Patient Name:   TONITA BILLS Gonsalves Date  of Exam: 04/30/2020 Medical Rec #:  [id number]     Height:       62.0 in Accession #:    [id number]    Weight:       112.9 lb Date of Birth:  03-Apr-1931     BSA:          1.499 m Patient Age:    89 years      BP:           129/61 mmHg Patient Gender: F             HR:           61 bpm. Exam Location:  Inpatient Procedure: 2D Echo, Color Doppler and Cardiac Doppler Indications:    R55 Syncope  History:        Patient has prior history of Echocardiogram examinations, most                 recent 07/22/2019. Risk Factors:Hypertension and Dyslipidemia.                 COVID+ in January 2021.  Sonographer:    Raquel Sarna Senior RDCS Referring Phys: [place]  1. Left ventricular ejection fraction, by estimation, is 60 to 65%. The left ventricle has normal function. The left ventricle has no regional wall motion abnormalities. Left ventricular diastolic parameters are indeterminate.  2. Right ventricular systolic function is normal. The right ventricular size is normal. There is mildly elevated pulmonary artery systolic pressure.  3. Right atrial size was moderately dilated.  4. The pericardial effusion is circumferential.  5. The mitral valve is normal in structure. Mild mitral valve regurgitation. No evidence of mitral stenosis.  6. The aortic valve is tricuspid. Aortic valve regurgitation is not visualized. Mild aortic valve sclerosis is present, with no evidence of aortic valve stenosis.  7. The inferior vena cava is normal in size with <50% respiratory variability, suggesting right atrial pressure of 8 mmHg. Comparison(s): No significant change from prior study. Conclusion(s)/Recommendation(s): Normal biventricular function without evidence of hemodynamically significant valvular heart disease. FINDINGS  Left Ventricle: Left ventricular ejection fraction, by estimation, is 60 to 65%. The left ventricle has normal function. The left ventricle has no regional wall motion abnormalities. The left ventricular  internal cavity size was normal in size. There is  no left ventricular hypertrophy. Left ventricular diastolic parameters are indeterminate. Right Ventricle: The right ventricular size is normal. No increase in right ventricular wall thickness. Right ventricular systolic function is normal. There is mildly elevated pulmonary artery systolic pressure. The tricuspid regurgitant velocity is 2.96  m/s, and with an  assumed right atrial pressure of 8 mmHg, the estimated right ventricular systolic pressure is 14.9 mmHg. Left Atrium: Left atrial size was normal in size. Right Atrium: Right atrial size was moderately dilated. Pericardium: Mix of small amount of echolucent (fluid) and medium echogenicity adjacent to epicardium. Likely pericardial fat pad, but cannot exclude other etiologies based on images. A small pericardial effusion is present. The pericardial effusion is circumferential. Mitral Valve: The mitral valve is normal in structure. Mild mitral valve regurgitation. No evidence of mitral valve stenosis. Tricuspid Valve: The tricuspid valve is normal in structure. Tricuspid valve regurgitation is mild . No evidence of tricuspid stenosis. Aortic Valve: The aortic valve is tricuspid. Aortic valve regurgitation is not visualized. Mild aortic valve sclerosis is present, with no evidence of aortic valve stenosis. There is mild calcification of the aortic valve. Pulmonic Valve: The pulmonic valve was not well visualized. Pulmonic valve regurgitation is trivial. No evidence of pulmonic stenosis. Aorta: The aortic root and ascending aorta are structurally normal, with no evidence of dilitation. Venous: The inferior vena cava is normal in size with less than 50% respiratory variability, suggesting right atrial pressure of 8 mmHg. IAS/Shunts: The atrial septum is grossly normal.  LEFT VENTRICLE PLAX 2D LVIDd:         4.10 cm  Diastology LVIDs:         2.20 cm  LV e' lateral:   7.40 cm/s LV PW:         0.80 cm  LV E/e'  lateral: 11.5 LV IVS:        0.90 cm  LV e' medial:    5.77 cm/s LVOT diam:     2.00 cm  LV E/e' medial:  14.7 LV SV:         67 LV SV Index:   45 LVOT Area:     3.14 cm  RIGHT VENTRICLE RV S prime:     11.40 cm/s TAPSE (M-mode): 1.9 cm LEFT ATRIUM           Index       RIGHT ATRIUM           Index LA diam:      3.10 cm 2.07 cm/m  RA Area:     17.50 cm LA Vol (A2C): 29.1 ml 19.41 ml/m RA Volume:   45.90 ml  30.62 ml/m LA Vol (A4C): 30.8 ml 20.55 ml/m  AORTIC VALVE LVOT Vmax:   88.80 cm/s LVOT Vmean:  70.000 cm/s LVOT VTI:    0.214 m  AORTA Ao Root diam: 2.80 cm Ao Asc diam:  2.90 cm MITRAL VALVE               TRICUSPID VALVE MV Area (PHT): 3.72 cm    TR Peak grad:   35.0 mmHg MV Decel Time: 204 msec    TR Vmax:        296.00 cm/s MV E velocity: 84.80 cm/s MV A velocity: 90.00 cm/s  SHUNTS MV E/A ratio:  0.94        Systemic VTI:  0.21 m                            Systemic Diam: 2.00 cm Buford Dresser MD Electronically signed by Buford Dresser MD Signature Date/Time: 04/30/2020/1:05:33 PM    Final    CT Maxillofacial Wo Contrast  Result Date: 04/29/2020 CLINICAL DATA:  Status post fall with facial trauma. EXAM: CT MAXILLOFACIAL WITHOUT CONTRAST TECHNIQUE:  Multidetector CT imaging of the maxillofacial structures was performed. Multiplanar CT image reconstructions were also generated. COMPARISON:  None. FINDINGS: Osseous: No mandibular fracture or dislocation. No destructive process. Possible small nondisplaced fracture of the superolateral left orbital wall. Orbits: Moderate left periorbital preseptal hematoma. The orbital contents are intact. Sinuses: Partial opacification of the bilateral ethmoid sinuses. Small air-fluid level in the left maxillary sinus. Soft tissues: Left periorbital scalp hematoma. IMPRESSION: 1. Possible small nondisplaced fracture of the superolateral left orbital wall. 2. Moderate left periorbital preseptal hematoma. The orbital contents are intact. 3. Partial  opacification of the bilateral ethmoid sinuses. Small air-fluid level in the left maxillary sinus. Electronically Signed   By: Fidela Salisbury M.D.   On: 04/29/2020 12:07    Review of Systems  Neurological: Positive for headaches.  All other systems reviewed and are negative.  Blood pressure 121/72, pulse 87, temperature 98.2 F (36.8 C), temperature source Oral, resp. rate (!) 21, height _0  (1.575 m), weight 51.2 kg, SpO2 94 %. Physical Exam Constitutional:      Appearance: Normal appearance. She is normal weight.  HENT:     Right Ear: External ear normal.     Left Ear: External ear normal.     Nose: Nose normal.     Mouth/Throat:     Mouth: Mucous membranes are moist.     Pharynx: Oropharynx is clear.  Eyes:     Extraocular Movements: Extraocular movements intact.     Pupils: Pupils are equal, round, and reactive to light.     Comments: Bilateral subconjunctival hemorrhage.  Bilateral periorbital ecchymosis without much edema.  No orbital stepoff.  Cardiovascular:     Rate and Rhythm: Normal rate.  Pulmonary:     Effort: Pulmonary effort is normal.  Musculoskeletal:     Cervical back: Normal range of motion.  Skin:    General: Skin is warm and dry.  Neurological:     General: No focal deficit present.     Mental Status: She is alert and oriented to person, place, and time.  Psychiatric:        Mood and Affect: Mood normal.        Behavior: Behavior normal.        Thought Content: Thought content normal.        Judgment: Judgment normal.     Assessment/Plan: Left lateral orbital fracture  I personally reviewed her maxillofacial CT demonstrating an isolated, non-displaced left orbital wall fracture that will require no intervention.  Melida Quitter 04/30/2020, 6:00 PM

## 2020-05-01 DIAGNOSIS — Y92009 Unspecified place in unspecified non-institutional (private) residence as the place of occurrence of the external cause: Secondary | ICD-10-CM

## 2020-05-01 DIAGNOSIS — S0285XA Fracture of orbit, unspecified, initial encounter for closed fracture: Secondary | ICD-10-CM

## 2020-05-01 DIAGNOSIS — W19XXXA Unspecified fall, initial encounter: Secondary | ICD-10-CM

## 2020-05-01 LAB — URINALYSIS, COMPLETE (UACMP) WITH MICROSCOPIC
Bacteria, UA: NONE SEEN
Bilirubin Urine: NEGATIVE
Glucose, UA: NEGATIVE mg/dL
Hgb urine dipstick: NEGATIVE
Ketones, ur: NEGATIVE mg/dL
Nitrite: NEGATIVE
Protein, ur: NEGATIVE mg/dL
Specific Gravity, Urine: 1.009 (ref 1.005–1.030)
pH: 5 (ref 5.0–8.0)

## 2020-05-01 MED ORDER — ENSURE ENLIVE PO LIQD
237.0000 mL | Freq: Two times a day (BID) | ORAL | Status: DC
  Administered 2020-05-01 – 2020-05-05 (×7): 237 mL via ORAL

## 2020-05-01 MED ORDER — ENOXAPARIN SODIUM 30 MG/0.3ML ~~LOC~~ SOLN: 30.0000 mg | Freq: Two times a day (BID) | SUBCUTANEOUS | Status: DC

## 2020-05-01 MED ORDER — TRAMADOL HCL 50 MG PO TABS: 50.0000 mg | ORAL_TABLET | Freq: Four times a day (QID) | ORAL | Status: DC | PRN

## 2020-05-01 MED ORDER — METHOCARBAMOL 500 MG PO TABS
500.0000 mg | ORAL_TABLET | Freq: Four times a day (QID) | ORAL | Status: DC
  Administered 2020-05-01 – 2020-05-05 (×15): 500 mg via ORAL
  Filled 2020-05-01 (×17): qty 1

## 2020-05-01 MED ORDER — LATANOPROST 0.005 % OP SOLN
1.0000 [drp] | Freq: Every day | OPHTHALMIC | Status: DC
  Administered 2020-05-01 – 2020-05-04 (×4): 1 [drp] via OPHTHALMIC
  Filled 2020-05-01 (×2): qty 2.5

## 2020-05-01 MED ORDER — HEPARIN SODIUM (PORCINE) 5000 UNIT/ML IJ SOLN
5000.0000 [IU] | Freq: Three times a day (TID) | INTRAMUSCULAR | Status: DC
  Administered 2020-05-01 – 2020-05-05 (×11): 5000 [IU] via SUBCUTANEOUS
  Filled 2020-05-01 (×13): qty 1

## 2020-05-01 MED ORDER — SODIUM CHLORIDE 0.9 % IV SOLN: INTRAVENOUS | Status: DC

## 2020-05-01 MED ORDER — ACETAMINOPHEN 500 MG PO TABS
1000.0000 mg | ORAL_TABLET | Freq: Four times a day (QID) | ORAL | Status: DC
  Administered 2020-05-01 – 2020-05-05 (×13): 1000 mg via ORAL
  Filled 2020-05-01 (×16): qty 2

## 2020-05-01 NOTE — Progress Notes (Signed)
Lovenox 30mg  BID was ordered for DVT prophylaxis. Her CrCl<30 ml/min. Dr Bobbye [place] is in a trauma code so D/w Dr Pietro Cassis. We will use SQ heparin.   Onnie Boer, PharmD, BCIDP, AAHIVP, CPP Infectious Disease Pharmacist 05/01/2020 2:59 PM

## 2020-05-01 NOTE — Consult Note (Signed)
Physical Medicine and Rehabilitation Consult Reason for Consult: Decreased functional ability after a fall Referring Physician: Trauma   HPI: [name] is a 86 y.o. right-handed female with history of left breast cancer status post chemo and radiation therapy in 2010, Covid January 2021, hypertension, hypothyroidism, hyperlipidemia.  Per chart review she lives with her husband and independent prior to admission and active.  1 level home 5 steps to entry.  Presented 04/29/2020 after a fall while at church with transient loss of consciousness.  She could not recall the events of the fall.  Cranial CT scan showed acute intracranial hemorrhage with bilateral frontal subarachnoid hemorrhage and small amount of right occipital subarachnoid hemorrhage.  Possible focus of intraparenchymal hemorrhage in the left frontal subcortical gray-white matter.  Left frontotemporal and periorbital preseptal scalp hematoma as well as nondisplaced superolateral orbital fracture.  CT cervical spine negative.  Admission chemistries unremarkable except glucose 116 and creatinine 1.06, WBC 11,300, SARS coronavirus negative.  Neurosurgery follow-up Dr. Sherley Bounds conservative care with follow-up cranial CT scan 05/01/2020 unchanged.  Maintained on Keppra for seizure prophylaxis.  No surgical intervention needed for left lateral orbital fracture per Dr. Melida Quitter.  Tolerating regular diet.  Therapy evaluations completed with recommendations of physical medicine rehab consult.   Pt reports LBM yesterday- is voiding on her own- walking to bathroom per pt.  Said feels better-but has "ways to go".  Per OT, needs visual cues to attend to L visual field.  She said she hasn't walked yet, with therapy. Says had headache, neck pain and backache this AM- was pretty bad- resolved for now with meds.   Review of Systems  Constitutional: Negative for chills and fever.  HENT: Negative for hearing loss.   Eyes: Negative for  blurred vision and double vision.  Respiratory: Negative for cough and shortness of breath.   Cardiovascular: Negative for chest pain and leg swelling.  Gastrointestinal: Positive for constipation. Negative for heartburn, nausea and vomiting.  Genitourinary: Negative for flank pain.  Musculoskeletal: Positive for myalgias.  Skin: Negative for rash.  All other systems reviewed and are negative.  Past Medical History:  Diagnosis Date  . Autoimmune disease ([place])   . Barrett's esophagus   . Bone lesion    sacrum  . Breast cancer ([place]) 2010   cT1 N0 M0; ER+/ PR+/ HER2 neg; s/p lumpectomy 07/2010; started adjuvant hormonal therapy 10/2009  . Cervical cancer ([place])   . Dysphagia   . Fx distal radius NEC-closed   . Glaucoma   . Humerus fracture 03/2012   impacted left humueral neck fracture, treated by Dr. Amedeo Plenty  . Hyperlipidemia   . Hypertension   . Osteoporosis 03/18/2012  . Pneumonia   . Thyroid disease    Past Surgical History:  Procedure Laterality Date  . ABDOMINAL HYSTERECTOMY  1972   cancer          1 tube 1 ovary  . APPENDECTOMY    . BREAST LUMPECTOMY Left 2010  . colon polyp removal    . EYE SURGERY    . HAND SURGERY    . ortho procedures     multiple  . OVARIAN CYST REMOVAL  1978  . SHOULDER SURGERY  1954   left   Family History  Problem Relation Age of Onset  . Heart disease Father   . Cancer Sister        lung, kidney  . Cancer Brother        brain  .  Cancer Paternal Aunt        breast  . Breast cancer Paternal Aunt   . Cancer Brother        liver, lung  . Osteoporosis Neg Hx    Social History:  reports that she quit smoking about 33 years ago. She has never used smokeless tobacco. She reports current alcohol use. She reports that she does not use drugs. Allergies:  Allergies  Allergen Reactions  . Daypro [Oxaprozin] Swelling    Face and tongue  . Prolia [Denosumab]     unknown   Medications Prior to Admission  Medication Sig Dispense Refill  .  Calcium Carbonate-Vitamin D (CALCIUM + D PO) Take 1 capsule by mouth daily.     Marland Kitchen latanoprost (XALATAN) 0.005 % ophthalmic solution Place 1 drop into both eyes at bedtime.     Marland Kitchen levothyroxine (SYNTHROID) 50 MCG tablet TAKE 1 TABLET BY MOUTH DAILY BEFORE BREAKFAST (Patient taking differently: Take 50 mcg by mouth daily. ) 90 tablet 3  . Multiple Vitamin (MULTIVITAMIN WITH MINERALS) TABS tablet Take 1 tablet by mouth daily.    . rosuvastatin (CRESTOR) 10 MG tablet Take 1 tablet (10 mg total) by mouth daily. TAKE 1 BY MOUTH DAILY 90 tablet 1  . verapamil (CALAN-SR) 120 MG CR tablet Take 1 tablet (120 mg total) by mouth daily. 90 tablet 2  . citalopram (CELEXA) 10 MG tablet Take 1 tablet (10 mg total) by mouth daily. (Patient not taking: Reported on 04/29/2020) 90 tablet 3    Home: Home Living Family/patient expects to be discharged to:: Private residence Living Arrangements: Spouse/significant other Available Help at Discharge: Family, Available 24 hours/day Type of Home: House Home Access: Stairs to enter [employer] of Steps: 4-5 Entrance Stairs-Rails: Right, Left, Can reach both Home Layout: One level Bathroom Shower/Tub: Tub/shower unit, Health visitor: Handicapped height Bathroom Accessibility: Yes Home Equipment: Shower seat - built in, [employer] - tub/shower  Functional History: Prior Function Level of Independence: Independent Comments: drives; active; enjoys hhiking; has had several falls Functional Status:  Mobility: Bed Mobility Overal bed mobility: Needs Assistance Bed Mobility: Supine to Sit Supine to sit: Min guard General bed mobility comments: moving impulsively; initially unsteady Transfers Overall transfer level: Needs assistance Transfers: Sit to/from Stand, Stand Pivot Transfers Sit to Stand: Min assist Stand pivot transfers: Min assist General transfer comment: mod A with mobility at times due to LOB      ADL: ADL Overall ADL's  : Needs assistance/impaired Eating/Feeding: Set up, Sitting Grooming: Minimal assistance, Standing Grooming Details (indicate cue type and reason): vc to locate items at times during ADL; pt cued to use washcloth to wash face instead of splashing water on face with hands; Pts states "I would but I don't have a washcloth" - pt's hand was on the washcloth; cues to turn off water; cues ot decrease volumne of water from water being on high and splashing all over sink Upper Body Bathing: Set up, Supervision/ safety, Sitting Lower Body Bathing: Minimal assistance, Sit to/from stand Upper Body Dressing : Set up, Supervision/safety, Sitting Lower Body Dressing: Minimal assistance, Sit to/from stand Toilet Transfer: Moderate assistance, Ambulation Toilet Transfer Details (indicate cue type and reason): Began with HHA min A however due to LOB x 3, pt required Mod A Toileting- Clothing Manipulation and Hygiene: Min guard, Sit to/from stand Functional mobility during ADLs: Moderate assistance (HHA; may do well with RW) General ADL Comments: impulsive at times; LOB during dynamic tasks  Cognition:  Cognition Overall Cognitive Status: Impaired/Different from baseline Orientation Level: Oriented X4 Cognition Arousal/Alertness: Awake/alert Behavior During Therapy: Impulsive, Anxious Overall Cognitive Status: Impaired/Different from baseline Area of Impairment: Attention, Memory, Safety/judgement, Awareness, Problem solving Current Attention Level: Sustained Memory: Decreased recall of precautions, Decreased short-term memory (decreased delayed recall - able to recall 1/3 words ) Safety/Judgement: Decreased awareness of safety, Decreased awareness of deficits Awareness: Emergent Problem Solving: Slow processing General Comments: Pt most likely impulsive at baeline perconservation wtih daughter; cognition appears worse than baseline; pt educated to not stand without assist, pt stood impulsively; tangential  at times; LOB x 3 in bathroom during ADL, pt with decreased awareness of impact of balance deficits on safety at times  Blood pressure (!) 151/77, pulse 63, temperature 98.2 F (36.8 C), temperature source Oral, resp. rate (!) 23, height '5\' 2"'$  (1.575 m), weight 51.2 kg, SpO2 97 %. Physical Exam Vitals and nursing note reviewed.  Constitutional:      Comments: Pt is elderly female- with white hair sitting up in bedside chair, on monitor, eating lunch, appropriate, but tangential, needs VC's to redirect, NAD  HENT:     Head:     Comments: Periorbital swelling and bruising Raccoon eyes B/L Also has L temple to cheek laceration- 3+ inches long- closed    Nose: Nose normal. No congestion.     Mouth/Throat:     Mouth: Mucous membranes are moist.     Pharynx: Oropharynx is clear. No oropharyngeal exudate.  Eyes:     Extraocular Movements: Extraocular movements intact.  Neck:     Comments: Neck sore with mild decrease in cervical ROM Cardiovascular:     Comments: RRR_ no M/R/G Pulmonary:     Comments: CTA B/L- no W/R/R- good air movement Abdominal:     Comments: Soft, NT, ND, (+)BS   Musculoskeletal:     Comments: Strength tested in deltoids, biceps, triceps, WE, grip and finger abd as well as HF, KE, KF, DF and PF-  Strength was 5/5 in UEs and LEs B/L, but needed reminders to test the L side.   Skin:    Comments: Bruising/raccoon eyes on face as detailed above  Neurological:     Comments: Patient is alert no acute distress.  Makes eye contact with examiner.  She follows simple commands.  Provides her name age and date of birth.  She was able to recall bits and pieces of her fall but overall cooperative with exam.  Tangential and needed redirection a few times.  Noted no decrease in sensation in face or in extremities x4 to light touch.  Got sleepy and started closing eyes and less talkative latter half of interview  Psychiatric:     Comments: Appropriate, slightly flat, groggy      No results found for this or any previous visit (from the past 24 hour(s)). CT HEAD WO CONTRAST  Result Date: 04/30/2020 CLINICAL DATA:  Follow-up intracranial hemorrhage.  Trauma. EXAM: CT HEAD WITHOUT CONTRAST TECHNIQUE: Contiguous axial images were obtained from the base of the skull through the vertex without intravenous contrast. COMPARISON:  04/29/2020 FINDINGS: Brain: Small to moderate volume acute subarachnoid hemorrhage, greatest over the frontal convexities, demonstrates slight interval redistribution but has not significantly changed in overall amount. No definite intraparenchymal or subdural hemorrhage is identified. No acute infarct or midline shift is evident. There is mild cerebral atrophy. Hypodensities in the cerebral white matter bilaterally are unchanged and nonspecific but compatible with chronic small vessel ischemic disease which is mild for age.  A 7 mm calcified mass along the tentorium on the left is unchanged and consistent with a small meningioma without significant associated mass effect or brain edema. Vascular: Calcified atherosclerosis at the skull base. No hyperdense vessel. Skull: No depressed skull fracture. Sinuses/Orbits: Bilateral sphenoid and left ethmoid sinus fluid levels. Clear mastoid air cells. Bilateral cataract extraction. Mildly decreased left periorbital soft tissue swelling. Other: None. IMPRESSION: 1. Unchanged subarachnoid hemorrhage. 2. No new intracranial abnormality. Electronically Signed   By: Logan Bores M.D.   On: 04/30/2020 05:49   CT Head Wo Contrast  Result Date: 04/29/2020 CLINICAL DATA:  Poly trauma. Loss of consciousness in confusion. EXAM: CT HEAD WITHOUT CONTRAST TECHNIQUE: Contiguous axial images were obtained from the base of the skull through the vertex without intravenous contrast. COMPARISON:  Feb 22, 2019 FINDINGS: Brain: There is no evidence of acute infarction. There is an acute intracranial hemorrhage with bilateral frontal  subarachnoid hemorrhage and small amount of right occipital subarachnoid hemorrhage. There is a focus of possible intraparenchymal hemorrhage in the left frontal subcortical gray matter. No evidence of midline shift or herniation. Moderate brain parenchymal volume loss and deep white matter microangiopathy. Vascular: Calcific atherosclerotic disease of the intra cavernous carotid arteries. Skull: Nondisplaced superolateral orbital fracture. Other: Left frontotemporal and periorbital preseptal scalp hematoma. IMPRESSION: 1. Acute intracranial hemorrhage with bilateral frontal subarachnoid hemorrhage and small amount of right occipital subarachnoid hemorrhage. 2. Possible focus of intraparenchymal hemorrhage in the left frontal subcortical gray matter. 3. Left frontotemporal and periorbital preseptal scalp hematoma. 4. Nondisplaced superolateral orbital fracture. Electronically Signed   By: Fidela Salisbury M.D.   On: 04/29/2020 11:53   CT Cervical Spine Wo Contrast  Result Date: 04/29/2020 CLINICAL DATA:  Status post fall. EXAM: CT CERVICAL SPINE WITHOUT CONTRAST TECHNIQUE: Multidetector CT imaging of the cervical spine was performed without intravenous contrast. Multiplanar CT image reconstructions were also generated. COMPARISON:  Cervical spine radiograph and MRI from 2008, report only. FINDINGS: Alignment: Normal. Skull base and vertebrae: No acute fracture. No primary bone lesion or focal pathologic process. Soft tissues and spinal canal: No prevertebral fluid or swelling. No visible canal hematoma. Disc levels: Multilevel osteoarthritic changes of the cervical spine. Upper chest: Biapical pleural calcifications. IMPRESSION: 1. No evidence of acute traumatic injury to the cervical spine. 2. Multilevel osteoarthritic changes of the cervical spine. Electronically Signed   By: Fidela Salisbury M.D.   On: 04/29/2020 12:11   EEG adult  Result Date: 04/30/2020 Lora Havens, MD     04/30/2020 11:00 AM  Patient Name: MELAYNA ROBARTS MRN: [id number] Epilepsy Attending: Lora Havens Referring Physician/Provider: Dr Karmen Bongo Date: 04/30/2020 Duration: 24.55 mins Patient history: 86yo F with repeated falls and now with [facility]. EEG to evaluate for seizure. Level of alertness: Awake, drowsy AEDs during EEG study: LEV Technical aspects: This EEG study was done with scalp electrodes positioned according to the 10-20 International system of electrode placement. Electrical activity was acquired at a sampling rate of '500Hz'$  and reviewed with a high frequency filter of '70Hz'$  and a low frequency filter of '1Hz'$ . EEG data were recorded continuously and digitally stored. Description: The posterior dominant rhythm consists of 9 Hz activity of moderate voltage (25-35 uV) seen predominantly in posterior head regions, symmetric and reactive to eye opening and eye closing. Drowsiness was characterized by attenuation of the posterior background rhythm. Physiology photic driving was seen during photic stimulation.  Hyperventilation was not performed.   IMPRESSION: This study is within normal limits. No  seizures or epileptiform discharges were seen throughout the recording. Lora Havens   ECHOCARDIOGRAM COMPLETE  Result Date: 04/30/2020    ECHOCARDIOGRAM REPORT   Patient Name:   [name] Alleman Date of Exam: 04/30/2020 Medical Rec #:  [id number]     Height:       62.0 in Accession #:    [id number]    Weight:       112.9 lb Date of Birth:  04-05-1931     BSA:          1.499 m Patient Age:    48 years      BP:           129/61 mmHg Patient Gender: F             HR:           61 bpm. Exam Location:  Inpatient Procedure: 2D Echo, Color Doppler and Cardiac Doppler Indications:    R55 Syncope  History:        Patient has prior history of Echocardiogram examinations, most                 recent 07/22/2019. Risk Factors:Hypertension and Dyslipidemia.                 COVID+ in January 2021.  Sonographer:    Raquel Sarna Senior RDCS Referring Phys:  [place]  1. Left ventricular ejection fraction, by estimation, is 60 to 65%. The left ventricle has normal function. The left ventricle has no regional wall motion abnormalities. Left ventricular diastolic parameters are indeterminate.  2. Right ventricular systolic function is normal. The right ventricular size is normal. There is mildly elevated pulmonary artery systolic pressure.  3. Right atrial size was moderately dilated.  4. The pericardial effusion is circumferential.  5. The mitral valve is normal in structure. Mild mitral valve regurgitation. No evidence of mitral stenosis.  6. The aortic valve is tricuspid. Aortic valve regurgitation is not visualized. Mild aortic valve sclerosis is present, with no evidence of aortic valve stenosis.  7. The inferior vena cava is normal in size with <50% respiratory variability, suggesting right atrial pressure of 8 mmHg. Comparison(s): No significant change from prior study. Conclusion(s)/Recommendation(s): Normal biventricular function without evidence of hemodynamically significant valvular heart disease. FINDINGS  Left Ventricle: Left ventricular ejection fraction, by estimation, is 60 to 65%. The left ventricle has normal function. The left ventricle has no regional wall motion abnormalities. The left ventricular internal cavity size was normal in size. There is  no left ventricular hypertrophy. Left ventricular diastolic parameters are indeterminate. Right Ventricle: The right ventricular size is normal. No increase in right ventricular wall thickness. Right ventricular systolic function is normal. There is mildly elevated pulmonary artery systolic pressure. The tricuspid regurgitant velocity is 2.96  m/s, and with an assumed right atrial pressure of 8 mmHg, the estimated right ventricular systolic pressure is 26.3 mmHg. Left Atrium: Left atrial size was normal in size. Right Atrium: Right atrial size was moderately dilated. Pericardium: Mix  of small amount of echolucent (fluid) and medium echogenicity adjacent to epicardium. Likely pericardial fat pad, but cannot exclude other etiologies based on images. A small pericardial effusion is present. The pericardial effusion is circumferential. Mitral Valve: The mitral valve is normal in structure. Mild mitral valve regurgitation. No evidence of mitral valve stenosis. Tricuspid Valve: The tricuspid valve is normal in structure. Tricuspid valve regurgitation is mild . No evidence of tricuspid stenosis. Aortic Valve: The aortic  valve is tricuspid. Aortic valve regurgitation is not visualized. Mild aortic valve sclerosis is present, with no evidence of aortic valve stenosis. There is mild calcification of the aortic valve. Pulmonic Valve: The pulmonic valve was not well visualized. Pulmonic valve regurgitation is trivial. No evidence of pulmonic stenosis. Aorta: The aortic root and ascending aorta are structurally normal, with no evidence of dilitation. Venous: The inferior vena cava is normal in size with less than 50% respiratory variability, suggesting right atrial pressure of 8 mmHg. IAS/Shunts: The atrial septum is grossly normal.  LEFT VENTRICLE PLAX 2D LVIDd:         4.10 cm  Diastology LVIDs:         2.20 cm  LV e' lateral:   7.40 cm/s LV PW:         0.80 cm  LV E/e' lateral: 11.5 LV IVS:        0.90 cm  LV e' medial:    5.77 cm/s LVOT diam:     2.00 cm  LV E/e' medial:  14.7 LV SV:         67 LV SV Index:   45 LVOT Area:     3.14 cm  RIGHT VENTRICLE RV S prime:     11.40 cm/s TAPSE (M-mode): 1.9 cm LEFT ATRIUM           Index       RIGHT ATRIUM           Index LA diam:      3.10 cm 2.07 cm/m  RA Area:     17.50 cm LA Vol (A2C): 29.1 ml 19.41 ml/m RA Volume:   45.90 ml  30.62 ml/m LA Vol (A4C): 30.8 ml 20.55 ml/m  AORTIC VALVE LVOT Vmax:   88.80 cm/s LVOT Vmean:  70.000 cm/s LVOT VTI:    0.214 m  AORTA Ao Root diam: 2.80 cm Ao Asc diam:  2.90 cm MITRAL VALVE               TRICUSPID VALVE MV Area  (PHT): 3.72 cm    TR Peak grad:   35.0 mmHg MV Decel Time: 204 msec    TR Vmax:        296.00 cm/s MV E velocity: 84.80 cm/s MV A velocity: 90.00 cm/s  SHUNTS MV E/A ratio:  0.94        Systemic VTI:  0.21 m                            Systemic Diam: 2.00 cm Jodelle Red MD Electronically signed by Jodelle Red MD Signature Date/Time: 04/30/2020/1:05:33 PM    Final    CT Maxillofacial Wo Contrast  Result Date: 04/29/2020 CLINICAL DATA:  Status post fall with facial trauma. EXAM: CT MAXILLOFACIAL WITHOUT CONTRAST TECHNIQUE: Multidetector CT imaging of the maxillofacial structures was performed. Multiplanar CT image reconstructions were also generated. COMPARISON:  None. FINDINGS: Osseous: No mandibular fracture or dislocation. No destructive process. Possible small nondisplaced fracture of the superolateral left orbital wall. Orbits: Moderate left periorbital preseptal hematoma. The orbital contents are intact. Sinuses: Partial opacification of the bilateral ethmoid sinuses. Small air-fluid level in the left maxillary sinus. Soft tissues: Left periorbital scalp hematoma. IMPRESSION: 1. Possible small nondisplaced fracture of the superolateral left orbital wall. 2. Moderate left periorbital preseptal hematoma. The orbital contents are intact. 3. Partial opacification of the bilateral ethmoid sinuses. Small air-fluid level in the left maxillary sinus. Electronically Signed  By: Fidela Salisbury M.D.   On: 04/29/2020 12:07     Assessment/Plan: Diagnosis: syncope with B/L traumatic SAH's with impaired function and mildly impaired cognition- od note, said hadn't walked with therapy- did 1 hr prior. 1. Does the need for close, 24 hr/day medical supervision in concert with the patient's rehab needs make it unreasonable for this patient to be served in a less intensive setting? Yes 2. Co-Morbidities requiring supervision/potential complications: HTN, HLD, L orbital fx, dysphagia, barret's  esopahgus 3. Due to bladder management, bowel management, safety, skin/wound care, disease management, medication administration, pain management and patient education, does the patient require 24 hr/day rehab nursing? Yes 4. Does the patient require coordinated care of a physician, rehab nurse, therapy disciplines of PT, OT and SLP to address physical and functional deficits in the context of the above medical diagnosis(es)? Yes Addressing deficits in the following areas: balance, endurance, locomotion, strength, transferring, bathing, dressing, feeding, grooming, toileting and cognition 5. Can the patient actively participate in an intensive therapy program of at least 3 hrs of therapy per day at least 5 days per week? Yes 6. The potential for patient to make measurable gains while on inpatient rehab is good 7. Anticipated functional outcomes upon discharge from inpatient rehab are modified independent and supervision  with PT, modified independent and supervision with OT, modified independent and supervision with SLP. 8. Estimated rehab length of stay to reach the above functional goals is: 7-10 days 9. Anticipated discharge destination: Home 10. Overall Rehab/Functional Prognosis: excellent  RECOMMENDATIONS: This patient's condition is appropriate for continued rehabilitative care in the following setting: CIR Patient has agreed to participate in recommended program. Potentially Note that insurance prior authorization may be required for reimbursement for recommended care.  Comment:  1. Pt having some memory issues- couldnt' remember had walked with PT, even with cues- and saw them 1 hr prior- is also tangential and needs redirections frequently.  2. Would benefit from short stay in CIR to work on PT, OT and SLP- was walking 5 miles/day and driving prior to admission.  3. Will submit for insurance with admission coordinators assistance 4. Thank you for this consult.   Lavon Paganini Angiulli,  PA-C 05/01/2020

## 2020-05-01 NOTE — Progress Notes (Signed)
Inpatient Rehab Admissions:  Inpatient Rehab Consult received.  I met with patient at the bedside to discuss inpatient rehab.  Pt is interested in program, if needed.  Will await results of formal PMR consult and f/u with pt/family.   Signed: Shann Medal, PT, DPT Admissions Coordinator [phone] 05/01/20  10:55 AM

## 2020-05-01 NOTE — TOC CAGE-AID Note (Signed)
Transition of Care [facility]) - CAGE-AID Screening   Patient Details  Name: [name] MRN: [id number] Date of Birth: 01-Dec-1930  Transition of Care [facility]) CM/SW Contact:    Emeterio Reeve, [place] Phone Number: 05/01/2020, 1:20 PM   Clinical Narrative:  CSW met with pt at bedside. CSW introduced self and explained her role at the hospital.  Pt denied alcohol use and substance use. Pt did not need resources.   CAGE-AID Screening:    Have You Ever Felt You Ought to Cut Down on Your Drinking or Drug Use?: No Have People Annoyed You By Critizing Your Drinking Or Drug Use?: No Have You Felt Bad Or Guilty About Your Drinking Or Drug Use?: No Have You Ever Had a Drink or Used Drugs First Thing In The Morning to Steady Your Nerves or to Get Rid of a Hangover?: No CAGE-AID Score: 0  Substance Abuse Education Offered: Yes  Substance abuse interventions: Patient Counseling  Emeterio Reeve, Latanya Presser, [place] Social Worker [phone]

## 2020-05-01 NOTE — Evaluation (Signed)
Speech Language Pathology Evaluation Patient Details Name: [name] MRN: [id number] DOB: [birth year] Today's Date: 05/01/2020 Time: 5573-2202 SLP Time Calculation (min) (ACUTE ONLY): 18 min  Problem List:  Patient Active Problem List   Diagnosis Date Noted  . SAH (subarachnoid hemorrhage) ([place]) 04/29/2020  . Fall at home, initial encounter 04/29/2020  . Orbital fracture, closed, initial encounter ([place]) 04/29/2020  . Basal cell carcinoma (BCC) in situ of skin 04/04/2020  . Essential hypertension 03/21/2019  . Stage 3a chronic kidney disease   . Systolic murmur 54/27/0623  . Hypercalcemia   . AKI (acute kidney injury) ([place])   . Dizziness 02/22/2019  . Squamous cell carcinoma in situ of skin 11/30/2018  . Diarrhea 09/08/2017  . Nausea vomiting and diarrhea 09/08/2017  . Elevated serum creatinine 09/08/2017  . PVC (premature ventricular contraction) 09/08/2017  . Glaucoma 09/08/2017  . Hx of meningioma of the brain 08/28/2017  . Hoarseness of voice 02/28/2016  . Raynaud's phenomenon 02/28/2016  . Esophageal spasm 09/20/2013  . Osteoporosis 03/18/2012  . Fracture, shoulder 03/06/2012  . Hyperlipidemia   . Breast cancer [facility])    Past Medical History:  Past Medical History:  Diagnosis Date  . Autoimmune disease ([place])   . Barrett's esophagus   . Bone lesion    sacrum  . Breast cancer ([place]) 2010   cT1 N0 M0; ER+/ PR+/ HER2 neg; s/p lumpectomy 07/2010; started adjuvant hormonal therapy 10/2009  . Cervical cancer ([place])   . Dysphagia   . Fx distal radius NEC-closed   . Glaucoma   . Humerus fracture 03/2012   impacted left humueral neck fracture, treated by Dr. Amedeo Plenty  . Hyperlipidemia   . Hypertension   . Osteoporosis 03/18/2012  . Pneumonia   . Thyroid disease    Past Surgical History:  Past Surgical History:  Procedure Laterality Date  . ABDOMINAL HYSTERECTOMY  1972   cancer          1 tube 1 ovary  . APPENDECTOMY    . BREAST LUMPECTOMY Left 2010  . colon polyp  removal    . EYE SURGERY    . HAND SURGERY    . ortho procedures     multiple  . OVARIAN CYST REMOVAL  1978  . SHOULDER SURGERY  1954   left   HPI:  86 y.o. female with medical history significant of L breast cancer s/p chemo/rads in 2010/2011; HTN; hypothyroidism; and HLD presenting after an unwitnessed fall at church. She has a prior h/o delusions and paranoid thinking in early 2020.  She was subsequently hospitalized for elevated BP (possibly related to anxiety) and had an unremarkable MRI.  More recently, she reported difficulty with transposition of numbers and possible hemianopsia of the left eye with ?neglect but without vision loss (20/20 vision since cataract surgery).  She may have mild baseline cognitive impairment but also has unawareness of her surroundings at times - gets lost while driving, falls frequently while not paying attention.    Assessment / Plan / Recommendation Clinical Impression  Pt scored 19/30 on the SLUMS, suggestive of cognitive impairment. Throughout testing she was distractible and tangential, and had significant difficulty with recall of words (recalling 1/5 after brief delay). She shows some awareness of her errors, but will at times talk around it and try to change the subject. Her speech and language are relatively intact. Recommend SLP f/u to maximize independence and safety.    SLP Assessment  SLP Recommendation/Assessment: Patient needs continued [place] Pathology Services  SLP Visit Diagnosis: Cognitive communication deficit (R41.841)    Follow Up Recommendations  Inpatient Rehab    Frequency and Duration min 2x/week  2 weeks      SLP Evaluation Cognition  Overall Cognitive Status: Impaired/Different from baseline Arousal/Alertness: Awake/alert Orientation Level: Oriented X4 Attention: Sustained Sustained Attention: Impaired Sustained Attention Impairment: Verbal complex Memory: Impaired Memory Impairment: Decreased recall of new  information;Decreased short term memory Decreased Short Term Memory: Verbal basic Awareness: Impaired Awareness Impairment: Intellectual impairment;Emergent impairment;Anticipatory impairment Problem Solving: Impaired Problem Solving Impairment: Verbal complex       Comprehension  Auditory Comprehension Overall Auditory Comprehension: Appears within functional limits for tasks assessed    Expression Expression Primary Mode of Expression: Verbal Verbal Expression Overall Verbal Expression: Appears within functional limits for tasks assessed   Oral / Motor  Motor Speech Overall Motor Speech: Appears within functional limits for tasks assessed   GO                    Osie Bond., M.A. [place] Acute Rehabilitation Services Pager [phone] Office [phone]  05/01/2020, 11:02 AM

## 2020-05-01 NOTE — Progress Notes (Signed)
   Trauma/Critical Care Follow Up Note  Subjective:    Overnight Issues:   Objective:  Vital signs for last 24 hours: Temp:  [98.2 F (36.8 C)-98.9 F (37.2 C)] 98.5 F (36.9 C) (07/20 1200) Pulse Rate:  [48-87] 67 (07/20 1300) Resp:  [15-28] 17 (07/20 1300) BP: (84-169)/(42-118) 146/71 (07/20 1300) SpO2:  [92 %-100 %] 100 % (07/20 1300)  Hemodynamic parameters for last 24 hours:    Intake/Output from previous day: 07/19 0701 - 07/20 0700 In: 1753 [I.V.:1753] Out: -   Intake/Output this shift: Total I/O In: 478.8 [I.V.:478.8] Out: 300 [Urine:300]  Vent settings for last 24 hours:    Physical Exam:  Gen: comfortable, no distress Neuro: non-focal exam HEENT: PERRL Neck: supple CV: RRR Pulm: unlabored breathing Abd: soft, NT GU: clear yellow urine Extr: wwp, no edema   No results found for this or any previous visit (from the past 24 hour(s)).  Assessment & Plan: The plan of care was discussed with the bedside nurse for the day, who is in agreement with this plan and no additional concerns were raised.   Present on Admission: . Breast cancer ([place]) . Stage 3a chronic kidney disease . Hyperlipidemia . Orbital fracture, closed, initial encounter ([place])    LOS: 2 days   Additional comments:I reviewed the patient's new clinical lab test results.   and I reviewed the patients new imaging test results.    Status post fall, unwitnessed  Left, superolateral orbital fracture - ENT c/s, Dr. Redmond Baseman, non-op management, no entrapment  Periorbital ecchymosis and swelling - supportive care Bilateral subarachnoid hemorrhage, intraparenchymal hemorrhage - Per Neurosurgery, Dr. Ronnald Ramp. CTH stable this AM. Keppra BID x7d for seizure prophylaxis. TBI therapies.  History of falls -  Blood sugar normal on presentation. On cardiac monitoring. EKG on admission NSR. Labs overall unremarkable. No urinary symptoms reported but having to get straight cathed. UA negative. TRH consulted  for syncopal w/u, negative thus far. EEG 7/19 negative, TSH normal, echo normal. Carotid duplex pending, most recent in 2018 with plaque at b/l bifurcations. Recommending delayed MRI of the brain when SAH/IPH stabilized. Recommends neuropsychiatric as outpatient.  Left forehead/cheek laceration - s/p repair in ED by EDP History of breast cancer CKD - Baseline ~1.06. Cr 1.13. On IVF. Trend.  Hx HTN - Hold home meds for now. PRN Hydralazine  Hx HLD - Continue home meds Hx Hypothyroidism - TSH wnl. Continue home meds FEN - HH, MIVF VTE - SCDs, Lovenox  Foley - [place] currently. Required straight cath overnight. Monitor.  Dispo - Transfer to [facility]. TBI therapies. Lives at home with her husband. Daughter lives in [place].    Jesusita Oka, MD Trauma & General Surgery Please use [email or web address] to contact on call provider  05/01/2020  *Care during the described time interval was provided by me. I have reviewed this patient's available data, including medical history, events of note, physical examination and test results as part of my evaluation.
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
## 2020-05-01 NOTE — Evaluation (Signed)
Physical Therapy Evaluation Patient Details Name: [name] MRN: [id number] DOB: 05/30/1931 Today's Date: 05/01/2020   History of Present Illness  86 y.o. female with medical history significant of L breast cancer s/p chemo/rads in 2010/2011; HTN; hypothyroidism; and HLD presenting after an unwitnessed fall at [place]. She has a prior h/o delusions and paranoid thinking in early 2020.  She was subsequently hospitalized for elevated BP (possibly related to anxiety) and had an unremarkable MRI.  More recently, she reported difficulty with transposition of numbers and possible hemianopsia of the left eye with ?neglect but without vision loss (20/20 vision since cataract surgery).  She may have mild baseline cognitive impairment but also has unawareness of her surroundings at times - gets lost while driving, falls frequently while not paying attention.     Clinical Impression  Pt with increased headache today and more fatigue. Per OT note pt was impulsive yesterday however today much improved but required more assist for transfers and ambulation. Recommend  CIR as pt was indep and walking 5 miles a day PTA. Pt to benefit from aggressive rehab program for maximal functional recovery to safe return home with spouse.     Follow Up Recommendations CIR    Equipment Recommendations  Rolling walker with 5" wheels    Recommendations for Other Services Rehab consult     Precautions / Restrictions Precautions Precautions: Fall Restrictions Weight Bearing Restrictions: No      Mobility  Bed Mobility Overal bed mobility: Needs Assistance Bed Mobility: Rolling;Sidelying to Sit Rolling: Min assist Sidelying to sit: Min assist       General bed mobility comments: pt with increased difficulty moving today, minA to complete rolling and minA for trunk elevation, pt slow to move due to all over soreness  Transfers Overall transfer level: Needs assistance Equipment used: 1 person hand held  assist Transfers: Sit to/from [facility] Sit to Stand: Min assist         General transfer comment: minA to power up, increased time, trunk flexed, c/o "ooooo i'm so sore"  Ambulation/Gait Ambulation/Gait assistance: Min assist Gait Distance (Feet): 120 Feet Assistive device: Rolling walker (2 wheeled) Gait Pattern/deviations: Step-through pattern;Decreased stride length;Trunk flexed;Narrow base of support Gait velocity: slow Gait velocity interpretation: <1.31 ft/sec, indicative of household ambulator General Gait Details: began amb without AD and R HHA however pt very guarded/cautious and mildly anxious, pt given RW, pt more steady however pt with c/o increased headache to 8/10 from 3/10  Stairs            Wheelchair Mobility    Modified Rankin (Stroke Patients Only) Modified Rankin (Stroke Patients Only) Pre-Morbid Rankin Score: No significant disability Modified Rankin: Moderate disability     Balance Overall balance assessment: History of Falls;Needs assistance Sitting-balance support: Feet supported Sitting balance-Leahy Scale: Fair     Standing balance support: Bilateral upper extremity supported;During functional activity Standing balance-Leahy Scale: Poor Standing balance comment: requires RW for safe ambulation                             Pertinent Vitals/Pain Pain Assessment: 0-10 Pain Score: 3  (increased to 8/10 with walking) Pain Location: headache Pain Descriptors / Indicators: Headache Pain Intervention(s): Limited activity within patient's tolerance    Home Living Family/patient expects to be discharged to:: Private residence Living Arrangements: Spouse/significant other Available Help at Discharge: Family;Available 24 hours/day Type of Home: House Home Access: Stairs to enter Entrance Stairs-Rails:  Right;Left;Can reach both Entrance Stairs-Number of Steps: 4-5 Home Layout: One level Home Equipment: Shower seat  - built in;Grab bars - tub/shower      Prior Function Level of Independence: Independent         Comments: drives; active; enjoys hhiking; has had several falls     Hand Dominance   Dominant Hand: Right    Extremity/Trunk Assessment   Upper Extremity Assessment Upper Extremity Assessment: Generalized weakness    Lower Extremity Assessment Lower Extremity Assessment: Generalized weakness    Cervical / Trunk Assessment Cervical / Trunk Assessment: Other exceptions (hx of back pain) Cervical / Trunk Exceptions: facial bruising  Communication   Communication: No difficulties  Cognition Arousal/Alertness: Awake/alert Behavior During Therapy: Flat affect Overall Cognitive Status: Impaired/Different from baseline Area of Impairment: Attention;Memory;Safety/judgement;Awareness;Problem solving                   Current Attention Level: Sustained Memory: Decreased short-term memory     Awareness: Emergent Problem Solving: Slow processing;Difficulty sequencing;Requires verbal cues;Requires tactile cues General Comments: pt more sad today stating "oh i feel horrible, I was doing better yesterday"      General Comments General comments (skin integrity, edema, etc.): facial bruising, VSS    Exercises     Assessment/Plan    PT Assessment Patient needs continued PT services  PT Problem List Decreased strength;Decreased activity tolerance;Decreased balance;Decreased mobility;Decreased coordination;Decreased cognition       PT Treatment Interventions DME instruction;Gait training;Stair training;Functional mobility training;Therapeutic activities;Therapeutic exercise;Balance training    PT Goals (Current goals can be found in the Care Plan section)  Acute Rehab PT Goals Patient Stated Goal: to feel better PT Goal Formulation: With patient Time For Goal Achievement: 05/15/20 Potential to Achieve Goals: Good    Frequency Min 4X/week   Barriers to discharge         Co-evaluation               AM-PAC PT "6 Clicks" Mobility  Outcome Measure Help needed turning from your back to your side while in a flat bed without using bedrails?: A Little Help needed moving from lying on your back to sitting on the side of a flat bed without using bedrails?: A Little Help needed moving to and from a bed to a chair (including a wheelchair)?: A Little Help needed standing up from a chair using your arms (e.g., wheelchair or bedside chair)?: A Little Help needed to walk in hospital room?: A Little Help needed climbing 3-5 steps with a railing? : A Lot 6 Click Score: 17    End of Session Equipment Utilized During Treatment: Gait belt Activity Tolerance: Patient limited by pain;Patient limited by fatigue Patient left: in chair;with call bell/phone within reach;with chair alarm set Nurse Communication: Mobility status PT Visit Diagnosis: Unsteadiness on feet (R26.81);Muscle weakness (generalized) (M62.81);History of falling (Z91.81)    Time: 4656-8127 PT Time Calculation (min) (ACUTE ONLY): 25 min   Charges:   PT Evaluation $PT Eval Moderate Complexity: 1 Mod PT Treatments $Gait Training: 8-22 mins        Kittie Plater, PT, DPT Acute Rehabilitation Services Pager #: [phone] Office #: [phone]   [place] Lopes 05/01/2020, 11:40 AM

## 2020-05-02 ENCOUNTER — Inpatient Hospital Stay (HOSPITAL_COMMUNITY): Payer: Medicare Other

## 2020-05-02 ENCOUNTER — Telehealth: Payer: Self-pay | Admitting: Family Medicine

## 2020-05-02 DIAGNOSIS — R55 Syncope and collapse: Secondary | ICD-10-CM

## 2020-05-02 LAB — CBC
HCT: 30.6 % — ABNORMAL LOW (ref 36.0–46.0)
Hemoglobin: 9.6 g/dL — ABNORMAL LOW (ref 12.0–15.0)
MCH: 29.9 pg (ref 26.0–34.0)
MCHC: 31.4 g/dL (ref 30.0–36.0)
MCV: 95.3 fL (ref 80.0–100.0)
Platelets: 154 10*3/uL (ref 150–400)
RBC: 3.21 MIL/uL — ABNORMAL LOW (ref 3.87–5.11)
RDW: 15.4 % (ref 11.5–15.5)
WBC: 7.3 10*3/uL (ref 4.0–10.5)
nRBC: 0 % (ref 0.0–0.2)

## 2020-05-02 LAB — BASIC METABOLIC PANEL
Anion gap: 7 (ref 5–15)
BUN: 12 mg/dL (ref 8–23)
CO2: 23 mmol/L (ref 22–32)
Calcium: 8.4 mg/dL — ABNORMAL LOW (ref 8.9–10.3)
Chloride: 109 mmol/L (ref 98–111)
Creatinine, Ser: 1.03 mg/dL — ABNORMAL HIGH (ref 0.44–1.00)
GFR calc Af Amer: 57 mL/min — ABNORMAL LOW (ref >60)
GFR calc non Af Amer: 49 mL/min — ABNORMAL LOW (ref >60)
Glucose, Bld: 91 mg/dL (ref 70–99)
Potassium: 4.2 mmol/L (ref 3.5–5.1)
Sodium: 139 mmol/L (ref 135–145)

## 2020-05-02 LAB — MAGNESIUM: Magnesium: 1.7 mg/dL (ref 1.7–2.4)

## 2020-05-02 LAB — PHOSPHORUS: Phosphorus: 2.9 mg/dL (ref 2.5–4.6)

## 2020-05-02 LAB — GLUCOSE, CAPILLARY: Glucose-Capillary: 123 mg/dL — ABNORMAL HIGH (ref 70–99)

## 2020-05-02 NOTE — Progress Notes (Signed)
[facility] Progress Note     Subjective: Patient reports that she had some dizziness earlier, this does not seem to be positional and has since improved. She denies pain. She denies SOB. Tolerating diet. Has not required any further I&O cath. Still having some issues with short term memory but alert and oriented x4 and answering questions appropriately. She denies any visual symptoms.   Objective: Vital signs in last 24 hours: Temp:  [97.4 F (36.3 C)-98.3 F (36.8 C)] 97.7 F (36.5 C) (07/21 1200) Pulse Rate:  [47-111] 71 (07/21 1300) Resp:  [6-24] 14 (07/21 1300) BP: (78-159)/(46-146) 146/77 (07/21 1300) SpO2:  [75 %-99 %] 96 % (07/21 1300) Last BM Date: 04/30/20  Intake/Output from previous day: 07/20 0701 - 07/21 0700 In: 1396.3 [I.V.:1396.3] Out: 300 [Urine:300] Intake/Output this shift: Total I/O In: 769.9 [P.O.:720; I.V.:49.9] Out: -   PE: Gen:  Alert, NAD, pleasant HEENT: Bilateral periorbital ecchymosis and swelling. EOM's intact without entrapment. Pupils equal, round and reactive to light. Left forehead and check laceration with sutures in place, c/d/i.  Card:  RRR Pulm:  CTAB, no W/R/R, effort normal Abd: Soft, NT/ND, +BS Ext: Passive rom of hand, wrist, elbow and shoulder bilaterally without pain. No TTP. Passive rom of ankle, knee, and hip bilaterally without pain. No TTP. No LE edema. DP 2+.  Psych: A&Ox4 Neuro: Non-focal. CN 3-12 grossly intact. Moves all extremities.  Skin: no rashes noted, warm and dry  Lab Results:  Recent Labs    04/30/20 0416 05/02/20 0403  WBC 12.8* 7.3  HGB 11.2* 9.6*  HCT 35.0* 30.6*  PLT 212 154   BMET Recent Labs    04/30/20 0416 05/02/20 0403  NA 139 139  K 4.6 4.2  CL 108 109  CO2 22 23  GLUCOSE 116* 91  BUN 15 12  CREATININE 1.13* 1.03*  CALCIUM 8.6* 8.4*   PT/INR No results for input(s): LABPROT, INR in the last 72 hours. CMP     Component Value Date/Time   NA 139 05/02/2020 0403   NA 140  03/18/2013 0900   K 4.2 05/02/2020 0403   K 4.4 03/18/2013 0900   CL 109 05/02/2020 0403   CL 105 03/18/2013 0900   CO2 23 05/02/2020 0403   CO2 25 03/18/2013 0900   GLUCOSE 91 05/02/2020 0403   GLUCOSE 95 03/18/2013 0900   BUN 12 05/02/2020 0403   BUN 20.2 03/18/2013 0900   CREATININE 1.03 (H) 05/02/2020 0403   CREATININE 1.10 (H) 08/20/2017 1544   CREATININE 1.0 03/18/2013 0900   CALCIUM 8.4 (L) 05/02/2020 0403   CALCIUM 9.2 02/23/2019 0746   CALCIUM 9.6 03/18/2013 0900   PROT 5.8 (L) 04/30/2020 0416   PROT 6.7 03/18/2013 0900   ALBUMIN 3.0 (L) 04/30/2020 0416   ALBUMIN 3.3 (L) 03/18/2013 0900   AST 31 04/30/2020 0416   AST 21 03/18/2013 0900   ALT 23 04/30/2020 0416   ALT 12 03/18/2013 0900   ALKPHOS 72 04/30/2020 0416   ALKPHOS 80 03/18/2013 0900   BILITOT 0.8 04/30/2020 0416   BILITOT 0.36 03/18/2013 0900   GFRNONAA 49 (L) 05/02/2020 0403   GFRNONAA 51 (L) 02/28/2016 0853   GFRAA 57 (L) 05/02/2020 0403   GFRAA 58 (L) 02/28/2016 0853   Lipase     Component Value Date/Time   LIPASE 27 09/08/2017 1126       Studies/Results: VAS US CAROTID  Result Date: 05/02/2020 Carotid Arterial Duplex Study Indications:  Syncope. Risk Factors:      Hypertension, hyperlipidemia. Comparison Study:  no prior Performing Technologist: Abram Sander RVS  Examination Guidelines: A complete evaluation includes B-mode imaging, spectral Doppler, color Doppler, and power Doppler as needed of all accessible portions of each vessel. Bilateral testing is considered an integral part of a complete examination. Limited examinations for reoccurring indications may be performed as noted.  Right Carotid Findings: +----------+--------+--------+--------+------------------+--------+           PSV cm/sEDV cm/sStenosisPlaque DescriptionComments +----------+--------+--------+--------+------------------+--------+ CCA Prox  86      16              heterogenous                +----------+--------+--------+--------+------------------+--------+ CCA Distal63      12              heterogenous               +----------+--------+--------+--------+------------------+--------+ ICA Prox  76      22      1-39%   heterogenous               +----------+--------+--------+--------+------------------+--------+ ICA Distal76      22                                         +----------+--------+--------+--------+------------------+--------+ ECA       76      7                                          +----------+--------+--------+--------+------------------+--------+ +----------+--------+-------+--------+-------------------+           PSV cm/sEDV cmsDescribeArm Pressure (mmHG) +----------+--------+-------+--------+-------------------+ [id number]                                         +----------+--------+-------+--------+-------------------+ +---------+--------+--+--------+--+---------+ VertebralPSV cm/s44EDV cm/s10Antegrade +---------+--------+--+--------+--+---------+  Left Carotid Findings: +----------+--------+--------+--------+------------------+--------+           PSV cm/sEDV cm/sStenosisPlaque DescriptionComments +----------+--------+--------+--------+------------------+--------+ CCA Prox  72      19              heterogenous               +----------+--------+--------+--------+------------------+--------+ CCA Distal69      18              heterogenous               +----------+--------+--------+--------+------------------+--------+ ICA Prox  76      25      1-39%   heterogenous               +----------+--------+--------+--------+------------------+--------+ ICA Distal55      14                                         +----------+--------+--------+--------+------------------+--------+ ECA       69      13                                          +----------+--------+--------+--------+------------------+--------+ +----------+--------+--------+--------+-------------------+  PSV cm/sEDV cm/sDescribeArm Pressure (mmHG) +----------+--------+--------+--------+-------------------+ [id number]                                          +----------+--------+--------+--------+-------------------+ +---------+--------+--+--------+-+---------+ VertebralPSV cm/s35EDV cm/s7Antegrade +---------+--------+--+--------+-+---------+   Summary: Right Carotid: Velocities in the right ICA are consistent with a 1-39% stenosis. Left Carotid: Velocities in the left ICA are consistent with a 1-39% stenosis. Vertebrals: Bilateral vertebral arteries demonstrate antegrade flow. *See table(s) above for measurements and observations.     Preliminary     Anti-infectives: Anti-infectives (From admission, onward)   None       Assessment/Plan Status post fall, unwitnessed  Left, superolateral orbital fracture- ENT c/s, Dr. Redmond Baseman, non-op management, no entrapment, no visual symptoms Periorbital ecchymosis and swelling- supportive care Bilateral subarachnoid hemorrhage, intraparenchymal hemorrhage- Per Neurosurgery, Dr. Ronnald Ramp. CTH stable this AM. Keppra BID x7d for seizure prophylaxis.TBI therapies. History of falls- Blood sugar normal on presentation. On cardiac monitoring. EKG on admission NSR. Labs overall unremarkable. No urinary symptoms reported but having to get straight cathed.UA negative.TRH consulted for syncopal w/u, negative thus far. EEG 7/19 negative, TSH normal, echo normal.Carotid duplex on prelim read does not show any significant stenosis, most recent in 2018 with plaque at b/l bifurcations. Recommending delayed MRI of the brain when SAH/IPH stabilized. Recommendsneuropsychiatricas outpatient.  Left forehead/cheek laceration- s/p repair in ED by EDP (absorbable sutures) History of breast cancer CKD- Baseline ~1.06. Cr  1.03. SLIV. Trend. Hx HTN- Hold home meds for now. PRNHydralazine Hx HLD- Continue home meds HxHypothyroidism- TSH wnl. Continue home meds  FEN -HH, MIVF VTE -SCDs, Lovenox  Foley - UOP improved and did not require any more I&O cath  Dispo - Transfer to [facility], CIR evaluating. TBI therapies. Lives at home with her husband. Daughter lives in [place].  LOS: 3 days    Norm Parcel , [facility] 05/02/2020, 2:02 PM Please see Amion for pager number during day hours 7:00am-4:30pm

## 2020-05-02 NOTE — Progress Notes (Addendum)
Inpatient Rehab Admissions Coordinator:   Met with pt at bedside to discuss goals/ELOS from consult yesterday.  We discussed starting insurance authorization process for CIR and pt agreeable.  Pt asked that I communicate with her daughter, [name], and I have left her a message to update her.  Also left a message for her husband, [name].    Addendum: I was able to speak to pt's daughters [name] and [name]) on the phone regarding possible CIR admission.  Both are interested in investigating other IRFs, in addition to [facility].  We discussed what [facility] has to offer and what a pt can expect if admitted, visitor policy, expected LOS and goals (including recommendations for 24/7 supervision at discharge), and weekly team conferences.  I let Ell[facility] Lambert know to f/u with them re: outside facilities and will f/u with family tomorrow to discuss their facility preference.     Shann Medal, PT, DPT Admissions Coordinator [phone] 05/02/20  10:23 AM

## 2020-05-02 NOTE — Telephone Encounter (Signed)
Called her daughter back to discuss her care.  We went over her hospital course and next steps.  Her daughter is currently feeling comfortable with Dicie's plan for care, I will look forward to seeing her when she is out of hospital

## 2020-05-02 NOTE — H&P (Signed)
Physical Medicine and Rehabilitation Admission H&P    Chief Complaint  Patient presents with  . Fall  . Altered Mental Status  : HPI: [name] is an 86 year old right-handed female with history of left breast cancer status post chemotherapy and radiation in 2010, Covid January 2021, hypertension, hypothyroidism, Barrett's esophagus, hyperlipidemia.  History taken from chart review, daughter, and patient.  Patient lives with her husband and independent prior to admission and active.  1 level home 5 steps to entry.  She presented on 04/29/2020 after a fall, ?syncopal event while at church with transient loss of consciousness.  She could not recall the events of the fall.  Cranial CT scan showed acute intracranial hemorrhage with bilateral frontal subarachnoid hemorrhage and small amount of right occipital subarachnoid hemorrhage.  Possible focus of intraparenchymal hemorrhage in the left frontal and subcortical gray-white matter.  Left frontotemporal periorbital preseptal scalp hematoma as well as nondisplaced superolateral orbital fracture.  CT cervical spine negative.  Admission chemistries unremarkable except glucose 116 creatinine 1.06, hemoglobin 12.2, WBC 7300, SARS coronavirus negative, urinalysis negative nitrite.  Neurosurgery, Dr. Sherley Bounds recommended conservative care with follow-up cranial CT scan on 05/01/2020.  Maintained on Keppra for seizure prophylaxis.  No surgical intervention needed for left lateral orbital fracture per Dr. Melida Quitter.  EEG negative for seizure.  Echocardiogram with ejection fraction of 65%. No wall motion abnormalities.  Carotid Dopplers with no ICA stenosis.  Follow-up chemistries close monitoring of creatinine improved 0.91 hemoglobin 10.9.  Patient was cleared to begin subcutaneous heparin for DVT prophylaxis.  Tolerating a regular diet.  Therapy evaluations completed and patient was admitted for a comprehensive rehab program.  Please see preadmission  assessment from earlier today as well.  Review of Systems  Constitutional: Negative for chills and fever.  HENT: Negative for hearing loss.   Eyes: Negative for blurred vision and double vision.  Respiratory: Negative for cough and shortness of breath.   Cardiovascular: Negative for chest pain, palpitations and leg swelling.  Gastrointestinal: Positive for constipation. Negative for heartburn, nausea and vomiting.  Genitourinary: Negative for flank pain and hematuria.  Musculoskeletal: Positive for back pain and myalgias.  Skin: Negative for rash.  Neurological: Positive for dizziness. Negative for sensory change, speech change and focal weakness.  Psychiatric/Behavioral: The patient has insomnia.   All other systems reviewed and are negative.  Past Medical History:  Diagnosis Date  . Autoimmune disease ([place])   . Barrett's esophagus   . Bone lesion    sacrum  . Breast cancer ([place]) 2010   cT1 N0 M0; ER+/ PR+/ HER2 neg; s/p lumpectomy 07/2010; started adjuvant hormonal therapy 10/2009  . Cervical cancer ([place])   . Dysphagia   . Fx distal radius NEC-closed   . Glaucoma   . Humerus fracture 03/2012   impacted left humueral neck fracture, treated by Dr. Amedeo Plenty  . Hyperlipidemia   . Hypertension   . Osteoporosis 03/18/2012  . Pneumonia   . Thyroid disease    Past Surgical History:  Procedure Laterality Date  . ABDOMINAL HYSTERECTOMY  1972   cancer          1 tube 1 ovary  . APPENDECTOMY    . BREAST LUMPECTOMY Left 2010  . colon polyp removal    . EYE SURGERY    . HAND SURGERY    . ortho procedures     multiple  . OVARIAN CYST REMOVAL  1978  . Desha   left  Family History  Problem Relation Age of Onset  . Heart disease Father   . Cancer Sister        lung, kidney  . Cancer Brother        brain  . Cancer Paternal Aunt        breast  . Breast cancer Paternal Aunt   . Cancer Brother        liver, lung  . Osteoporosis Neg Hx    Social History:   reports that she quit smoking about 33 years ago. She has never used smokeless tobacco. She reports current alcohol use. She reports that she does not use drugs. Allergies:  Allergies  Allergen Reactions  . Daypro [Oxaprozin] Swelling    Face and tongue  . Prolia [Denosumab]     unknown   Medications Prior to Admission  Medication Sig Dispense Refill  . Calcium Carbonate-Vitamin D (CALCIUM + D PO) Take 1 capsule by mouth daily.     . citalopram (CELEXA) 10 MG tablet Take 1 tablet (10 mg total) by mouth daily. (Patient not taking: Reported on 04/29/2020) 90 tablet 3  . latanoprost (XALATAN) 0.005 % ophthalmic solution Place 1 drop into both eyes at bedtime.     Marland Kitchen levothyroxine (SYNTHROID) 50 MCG tablet TAKE 1 TABLET BY MOUTH DAILY BEFORE BREAKFAST (Patient taking differently: Take 50 mcg by mouth daily. ) 90 tablet 3  . Multiple Vitamin (MULTIVITAMIN WITH MINERALS) TABS tablet Take 1 tablet by mouth daily.    . rosuvastatin (CRESTOR) 10 MG tablet Take 1 tablet (10 mg total) by mouth daily. TAKE 1 BY MOUTH DAILY 90 tablet 1  . verapamil (CALAN-SR) 120 MG CR tablet Take 1 tablet (120 mg total) by mouth daily. 90 tablet 2    Drug Regimen Review Drug regimen was reviewed and remains appropriate with no significant issues identified  Home: Home Living Family/patient expects to be discharged to:: Private residence Living Arrangements: Spouse/significant other Available Help at Discharge: Family, Available 24 hours/day Type of Home: House Home Access: Stairs to enter [employer] of Steps: 4-5 Entrance Stairs-Rails: Right, Left, Can reach both Home Layout: One level Bathroom Shower/Tub: Tub/shower unit, Multimedia programmer: Handicapped height Bathroom Accessibility: Yes Home Equipment: Shower seat - built in, [employer] - tub/shower   Functional History: Prior Function Level of Independence: Independent Comments: drives; active; enjoys hhiking; has had several  falls  Functional Status:  Mobility: Bed Mobility Overal bed mobility: Needs Assistance Bed Mobility: Rolling, Sidelying to Sit, Sit to Supine Rolling: Min assist Sidelying to sit: Min assist Supine to sit: Min guard Sit to supine: Mod assist General bed mobility comments: assist for technique due to back pain, assist for lifting trunk to sit and for feet into bed to supine Transfers Overall transfer level: Needs assistance Equipment used: 1 person hand held assist, Rolling walker (2 wheeled) Transfers: Sit to/from Stand Sit to Stand: Min assist Stand pivot transfers: Min assist General transfer comment: up from EOB with HHA to walk to bathroom, then to RW from toilet and ambulating to hallway then to recliner, stand step to bed with RW Ambulation/Gait Ambulation/Gait assistance: Min assist Gait Distance (Feet): 150 Feet Assistive device: Rolling walker (2 wheeled) Gait Pattern/deviations: Step-through pattern, Decreased stride length, Shuffle, Antalgic General Gait Details: pain in back limiting tolerance requiring RW for support due to pain in back Gait velocity: functional Gait velocity interpretation: 1.31 - 2.62 ft/sec, indicative of limited community ambulator Stairs: Yes Stairs assistance: Min assist Stair Management:  One rail Right Number of Stairs: 4    ADL: ADL Overall ADL's : Needs assistance/impaired Eating/Feeding: Set up, Sitting Grooming: Wash/dry hands, Wash/dry face, Oral care, Brushing hair, Min guard, Standing Grooming Details (indicate cue type and reason): washing hair with shampoo cap (set-up/S) while seated Upper Body Bathing: Set up, Supervision/ safety, Sitting Lower Body Bathing: Minimal assistance, Sit to/from stand Upper Body Dressing : Set up, Supervision/safety, Sitting Lower Body Dressing: Minimal assistance, Sit to/from stand Toilet Transfer: Minimal assistance, Ambulation, Comfort height toilet, Grab bars Toilet Transfer Details (indicate  cue type and reason): no AD Toileting- Clothing Manipulation and Hygiene: Min guard, Sit to/from stand Functional mobility during ADLs: Moderate assistance (HHA; may do well with RW) General ADL Comments: impulsive at times; LOB during dynamic tasks  Cognition: Cognition Overall Cognitive Status: Impaired/Different from baseline Arousal/Alertness: Awake/alert Orientation Level: Oriented X4 Attention: Sustained Sustained Attention: Impaired Sustained Attention Impairment: Verbal complex Memory: Impaired Memory Impairment: Decreased recall of new information, Decreased short term memory Decreased Short Term Memory: Verbal basic Awareness: Impaired Awareness Impairment: Intellectual impairment, Emergent impairment, Anticipatory impairment Problem Solving: Impaired Problem Solving Impairment: Verbal complex Cognition Arousal/Alertness: Awake/alert Behavior During Therapy: WFL for tasks assessed/performed Overall Cognitive Status: Impaired/Different from baseline Area of Impairment: Memory, Safety/judgement Current Attention Level: Selective Memory: Decreased short-term memory Safety/Judgement: Decreased awareness of safety Awareness: Emergent Problem Solving: Requires verbal cues General Comments: tearful feeling poorly today back and head pain and nausea; repeated complaints frequently throughout with decreased STM  Physical Exam: Blood pressure (!) 133/76, pulse 60, temperature 97.6 F (36.4 C), temperature source Oral, resp. rate 20, height 5' 2" (1.575 m), weight 51.2 kg, SpO2 97 %. Physical Exam Vitals reviewed.  Constitutional:      General: She is not in acute distress.    Comments: Thin  HENT:     Head: Normocephalic.     Comments: Bilateral periorbital ecchymosis    Nose: Nose normal.  Eyes:     General:        Right eye: No discharge.        Left eye: No discharge.     Extraocular Movements: Extraocular movements intact.  Cardiovascular:     Rate and Rhythm:  Normal rate and regular rhythm.  Pulmonary:     Effort: Pulmonary effort is normal. No respiratory distress.     Breath sounds: Normal breath sounds. No stridor.  Abdominal:     General: Bowel sounds are normal. There is no distension.     Palpations: Abdomen is soft.  Musculoskeletal:     Cervical back: Normal range of motion.     Comments: No edema or tenderness in extremities  Skin:    General: Skin is warm and dry.     Comments: See above  Neurological:     Mental Status: She is alert.     Comments: Alert Makes eye contact with examiner.   She follows simple commands.   Provides her name and age and year but she is a bit tangential and needed some verbal cues at times to redirect.   She could not recall full events of the fall. Motor: Grossly 5/5 throughout Sensation intact light touch  Psychiatric:        Mood and Affect: Mood normal.        Behavior: Behavior normal.     Results for orders placed or performed during the hospital encounter of 04/29/20 (from the past 48 hour(s))  CBC     Status: Abnormal   Collection  Time: 05/04/20  9:33 AM  Result Value Ref Range   WBC 9.0 4.0 - 10.5 K/uL   RBC 3.58 (L) 3.87 - 5.11 MIL/uL   Hemoglobin 10.7 (L) 12.0 - 15.0 g/dL   HCT 33.3 (L) 36 - 46 %   MCV 93.0 80.0 - 100.0 fL   MCH 29.9 26.0 - 34.0 pg   MCHC 32.1 30.0 - 36.0 g/dL   RDW 15.2 11.5 - 15.5 %   Platelets 208 150 - 400 K/uL   nRBC 0.0 0.0 - 0.2 %    Comment: Performed at [facility], [place] [address] [place], [place] [address]  Basic metabolic panel     Status: Abnormal   Collection Time: 05/04/20  9:33 AM  Result Value Ref Range   Sodium 137 135 - 145 mmol/L   Potassium 4.0 3.5 - 5.1 mmol/L   Chloride 103 98 - 111 mmol/L   CO2 25 22 - 32 mmol/L   Glucose, Bld 126 (H) 70 - 99 mg/dL    Comment: Glucose reference range applies only to samples taken after fasting for at least 8 hours.   BUN 12 8 - 23 mg/dL   Creatinine, Ser 0.95 0.44 - 1.00 mg/dL   Calcium  9.0 8.9 - 10.3 mg/dL   GFR calc non Af Amer 54 (L) >60 mL/min   GFR calc Af Amer >60 >60 mL/min   Anion gap 9 5 - 15    Comment: Performed at [facility] [address] [place], [place] [address]  Magnesium     Status: None   Collection Time: 05/04/20  9:33 AM  Result Value Ref Range   Magnesium 1.7 1.7 - 2.4 mg/dL    Comment: Performed at [facility] [address] [place], [place] [address]  Phosphorus     Status: None   Collection Time: 05/04/20  9:33 AM  Result Value Ref Range   Phosphorus 3.7 2.5 - 4.6 mg/dL    Comment: Performed at [facility] [address] [place], [place] [address]  Basic metabolic panel     Status: Abnormal   Collection Time: 05/05/20  5:52 AM  Result Value Ref Range   Sodium 135 135 - 145 mmol/L   Potassium 4.3 3.5 - 5.1 mmol/L   Chloride 101 98 - 111 mmol/L   CO2 24 22 - 32 mmol/L   Glucose, Bld 124 (H) 70 - 99 mg/dL    Comment: Glucose reference range applies only to samples taken after fasting for at least 8 hours.   BUN 14 8 - 23 mg/dL   Creatinine, Ser 0.92 0.44 - 1.00 mg/dL   Calcium 9.1 8.9 - 10.3 mg/dL   GFR calc non Af Amer 56 (L) >60 mL/min   GFR calc Af Amer >60 >60 mL/min   Anion gap 10 5 - 15    Comment: Performed at [facility] [address], [place], [place] [address]  CBC with Differential/Platelet     Status: Abnormal   Collection Time: 05/05/20  5:52 AM  Result Value Ref Range   WBC 10.1 4.0 - 10.5 K/uL   RBC 3.70 (L) 3.87 - 5.11 MIL/uL   Hemoglobin 11.2 (L) 12.0 - 15.0 g/dL   HCT 34.5 (L) 36 - 46 %   MCV 93.2 80.0 - 100.0 fL   MCH 30.3 26.0 - 34.0 pg   MCHC 32.5 30.0 - 36.0 g/dL   RDW 15.3 11.5 - 15.5 %  Platelets 204 150 - 400 K/uL    Comment: REPEATED TO VERIFY   nRBC 0.0 0.0 - 0.2 %   Neutrophils Relative % 74 %   Neutro Abs 7.5 1.7 - 7.7 K/uL   Lymphocytes Relative 16 %   Lymphs Abs 1.6 0.7 - 4.0 K/uL   Monocytes Relative 8 %   Monocytes Absolute 0.8 0 - 1 K/uL   Eosinophils  Relative 1 %   Eosinophils Absolute 0.1 0 - 0 K/uL   Basophils Relative 0 %   Basophils Absolute 0.0 0 - 0 K/uL   Immature Granulocytes 1 %   Abs Immature Granulocytes 0.05 0.00 - 0.07 K/uL    Comment: Performed at [facility] [address], [place], [place] [address]  Magnesium     Status: None   Collection Time: 05/05/20  5:52 AM  Result Value Ref Range   Magnesium 1.7 1.7 - 2.4 mg/dL    Comment: Performed at [facility] [address] [place], [place] [address]   DG Lumbar Spine 2-3 Views  Result Date: 05/04/2020 CLINICAL DATA:  Status post fall. EXAM: LUMBAR SPINE - 2-3 VIEW COMPARISON:  May 17, 2013 FINDINGS: There is no evidence of an acute lumbar spine fracture. A chronic compression fracture deformity is seen involving the L1 vertebral body. Mild-to-moderate severity multilevel endplate sclerosis is seen. There is stable, approximately 2 mm retrolisthesis of the L3 vertebral body on L4. Mild multilevel intervertebral disc space narrowing is seen. This is most prominent along the posterior aspect of the L3-L4 level. Moderate severity calcification of the abdominal aorta is noted. IMPRESSION: 1. Stable chronic compression fracture deformity of the L1 vertebral body. 2. Stable minimal retrolisthesis of L3 on L4. 3. Mild to moderate multilevel degenerative disc disease. Electronically Signed   By: Virgina Norfolk M.D.   On: 05/04/2020 15:45    Medical Problem List and Plan: 1.  Decreased functional mobility secondary to traumatic bilateral frontal SAH with small amount of right occipital subarachnoid hemorrhage as well as nondisplaced superolateral orbital fracture  -patient may shower  -ELOS/Goals: 7--11 days/supervision/mod I  Admit to CIR 2.  Antithrombotics: -DVT/anticoagulation: Subcutaneous heparin  -antiplatelet therapy: N/A 3. Pain Management: Robaxin 500 mg every 6 hours and oxycodone as needed  Monitor with increased exertion. 4. Mood: Patient on Celexa  prior to admission.  -antipsychotic agents: N/A 5. Neuropsych: This patient is capable of making decisions on her own behalf. 6. Skin/Wound Care: Routine skin checks 7. Fluids/Electrolytes/Nutrition: Routine in and outs CMP ordered. 8.  Seizure prophylaxis.  Keppra 500 mg twice daily x 7 days.  EEG negative 9.  Hypertension.  Norvasc 2.5 mg daily.    Monitor with increased mobility. 10.  Question syncope.  Carotid Dopplers negative.  Echocardiogram unremarkable.  Monitor with increased mobility 11.  Hypothyroidism.  TSH 4.079.  Continue Synthroid 12.  Hyperlipidemia.  Continue Crestor. 13. CKD stage III.  Latest creatinine 1.03.    Creatinine 0.92 on 7/24, BMP ordered 14.  Acute blood loss anemia  Hemoglobin 11.2 on 7/24, CBC ordered  Cathlyn Parsons, PA-C 05/05/2020  I have personally performed a face to face diagnostic evaluation, including, but not limited to relevant history and physical exam findings, of this patient and developed relevant assessment and plan.  Additionally, I have reviewed and concur with the physician assistant's documentation above.  Delice Lesch, MD, ABPMR

## 2020-05-02 NOTE — PMR Pre-admission (Signed)
PMR Admission Coordinator Pre-Admission Assessment  Patient: [name] is an 86 y.o., female MRN: [id number] DOB: [birth year] Height: '5\' 2"'$  (157.5 cm) Weight: 51.2 kg              Insurance Information HMO:     PPO: yes     PCP:      IPA:      80/20:      OTHER:  PRIMARY: BCBS Blue MCR      Policy#: [id number]      Subscriber: pt CM Name: Larene Beach      Phone#: [phone]     Fax#: [phone] Pre-Cert#: [id number]  (final auth number to be provided on admission); updates due to fax listed above 7 days from admit    Employer:  Benefits:  Phone #: [phone]     Name:  Eff. Date: 10/14/2019     Deduct: $0      Out of Pocket Max: $5900 ($140 met)      Life Max: n/a  CIR: $335/day for days 1-6      SNF: 20 full days Outpatient:      Co-Pay: $40/visit (waived during covid-19 pandemic) Home Health: 100%      Co-Pay:  DME: 80%     Co-Pay: 20% Providers: preferred network   SECONDARY:       Policy#:       Phone#:   Development worker, community:       Phone#:   The Engineer, petroleum" for patients in Inpatient Rehabilitation Facilities with attached "Privacy Act [place] Records" was provided and verbally reviewed with: Patient and Family  Emergency Contact Information Contact Information    Name Relation Home Work Mobile   [place] Daughter   [phone]   Deeann Cree Daughter [phone]  [phone]     Current Medical History  Patient Admitting Diagnosis: bilateral traumatic SAH's  History of Present Illness: [name] is a 86 y.o. right-handed female with history of left breast cancer( status post chemo and radiation therapy in 2010), Covid-19 positive in January 2021, hypertension, hypothyroidism, hyperlipidemia. Presented 04/29/2020 after a fall while at church with transient loss of consciousness.  She could not recall the events of the fall.  Cranial CT scan showed acute intracranial  hemorrhage with bilateral frontal subarachnoid hemorrhage and small amount of right occipital subarachnoid hemorrhage.  Possible focus of intraparenchymal hemorrhage in the left frontal subcortical gray-white matter.  Left frontotemporal and periorbital preseptal scalp hematoma as well as nondisplaced superolateral orbital fracture.  CT cervical spine negative.  Admission chemistries unremarkable except glucose 116 and creatinine 1.06, WBC 11,300, SARS coronavirus negative.  Neurosurgery consult with Dr. Sherley Bounds, who recommended conservative care with follow-up cranial CT scan 05/01/2020 unchanged.  Maintained on Keppra for seizure prophylaxis.  No surgical intervention needed for left lateral orbital fracture per Dr. Melida Quitter.  Tolerating regular diet.  Therapy evaluations completed with recommendations of physical medicine rehab consult.     Past Medical History  Past Medical History:  Diagnosis Date  . Autoimmune disease ([place])   . Barrett's esophagus   . Bone lesion    sacrum  . Breast cancer ([place]) 2010   cT1 N0 M0; ER+/ PR+/ HER2 neg; s/p lumpectomy 07/2010; started adjuvant hormonal therapy 10/2009  . Cervical cancer ([place])   . Dysphagia   . Fx distal radius NEC-closed   . Glaucoma   . Humerus fracture 03/2012   impacted left humueral neck  fracture, treated by Dr. Amedeo Plenty  . Hyperlipidemia   . Hypertension   . Osteoporosis 03/18/2012  . Pneumonia   . Thyroid disease     Family History  family history includes Breast cancer in her paternal aunt; Cancer in her brother, brother, paternal aunt, and sister; Heart disease in her father.  Prior Rehab/Hospitalizations:  Has the patient had prior rehab or hospitalizations prior to admission? No  Has the patient had major surgery during 100 days prior to admission? No  Current Medications   Current Facility-Administered Medications:  .  acetaminophen (TYLENOL) tablet 1,000 mg, 1,000 mg, Oral, Q6H, Norm Parcel, PA-C, 1,000 mg at  05/04/20 0717 .  amLODipine (NORVASC) tablet 2.5 mg, 2.5 mg, Oral, Daily, Dahal, Binaya, MD, 2.5 mg at 05/04/20 1007 .  bisacodyl (DULCOLAX) suppository 10 mg, 10 mg, Rectal, Daily PRN, Meuth, Brooke A, PA-C .  Chlorhexidine Gluconate Cloth 2 % PADS 6 each, 6 each, Topical, Daily, Norm Parcel, PA-C, 6 each at 05/04/20 1007 .  docusate sodium (COLACE) capsule 100 mg, 100 mg, Oral, BID, Norm Parcel, PA-C, 100 mg at 05/04/20 1007 .  feeding supplement (ENSURE ENLIVE) (ENSURE ENLIVE) liquid 237 mL, 237 mL, Oral, BID BM, Norm Parcel, PA-C, 237 mL at 05/04/20 1008 .  heparin injection 5,000 Units, 5,000 Units, Subcutaneous, Q8H, Norm Parcel, PA-C, 5,000 Units at 05/04/20 0602 .  hydrALAZINE (APRESOLINE) injection 10 mg, 10 mg, Intravenous, Q2H PRN, Norm Parcel, PA-C .  latanoprost (XALATAN) 0.005 % ophthalmic solution 1 drop, 1 drop, Both Eyes, QHS, Johnson, [employer], PA-C, 1 drop at 05/03/20 2222 .  levETIRAcetam (KEPPRA) tablet 500 mg, 500 mg, Oral, BID, Norm Parcel, PA-C, 500 mg at 05/04/20 1008 .  levothyroxine (SYNTHROID) tablet 50 mcg, 50 mcg, Oral, Q0600, Norm Parcel, PA-C, 50 mcg at 05/04/20 0602 .  methocarbamol (ROBAXIN) tablet 500 mg, 500 mg, Oral, Q6H, Norm Parcel, PA-C, 500 mg at 05/04/20 1008 .  multivitamin with minerals tablet 1 tablet, 1 tablet, Oral, Daily, Norm Parcel, PA-C, 1 tablet at 05/04/20 1008 .  ondansetron (ZOFRAN-ODT) disintegrating tablet 4 mg, 4 mg, Oral, Q6H PRN **OR** ondansetron (ZOFRAN) injection 4 mg, 4 mg, Intravenous, Q6H PRN, Norm Parcel, PA-C, 4 mg at 04/29/20 1803 .  oxyCODONE (Oxy IR/ROXICODONE) immediate release tablet 2.5-5 mg, 2.5-5 mg, Oral, Q4H PRN, Norm Parcel, PA-C, 2.5 mg at 05/03/20 1230 .  [START ON 05/05/2020] polyethylene glycol (MIRALAX / GLYCOLAX) packet 17 g, 17 g, Oral, Daily, Meuth, Brooke A, PA-C .  rosuvastatin (CRESTOR) tablet 10 mg, 10 mg, Oral, Daily, Norm Parcel, PA-C, 10 mg at  05/04/20 1008  Patients Current Diet:  Diet Order            Diet Heart Room service appropriate? Yes with Assist; Fluid consistency: Thin  Diet effective now                 Precautions / Restrictions Precautions Precautions: Fall Restrictions Weight Bearing Restrictions: No   Has the patient had 2 or more falls or a fall with injury in the past year? Yes, the fall that led to this admission  Prior Activity Level Community (5-7x/wk): family reports very active, still driving, walking 5 miles per day  Prior Functional Level Prior Function Level of Independence: Independent Comments: drives; active; enjoys hhiking; has had several falls  Self Care: Did the patient need help bathing, dressing, using the toilet or eating?  Independent  Indoor  Mobility: Did the patient need assistance with walking from room to room (with or without device)? Independent  Stairs: Did the patient need assistance with internal or external stairs (with or without device)? Independent  Functional Cognition: Did the patient need help planning regular tasks such as shopping or remembering to take medications? Independent  Home Assistive Devices / Equipment Home Equipment: Shower seat - built in, Grab bars - tub/shower  Prior Device Use: Indicate devices/aids used by the patient prior to current illness, exacerbation or injury? None of the above  Current Functional Level Cognition  Arousal/Alertness: Awake/alert Overall Cognitive Status: Impaired/Different from baseline Current Attention Level: Sustained Orientation Level: Oriented X4 Safety/Judgement: Decreased awareness of safety General Comments: pt more sad today stating "oh i feel horrible, I was doing better yesterday" Attention: Sustained Sustained Attention: Impaired Sustained Attention Impairment: Verbal complex Memory: Impaired Memory Impairment: Decreased recall of new information, Decreased short term memory Decreased Short Term  Memory: Verbal basic Awareness: Impaired Awareness Impairment: Intellectual impairment, Emergent impairment, Anticipatory impairment Problem Solving: Impaired Problem Solving Impairment: Verbal complex    Extremity Assessment (includes Sensation/Coordination)  Upper Extremity Assessment: Generalized weakness  Lower Extremity Assessment: Generalized weakness    ADLs  Overall ADL's : Needs assistance/impaired Eating/Feeding: Set up, Sitting Grooming: Wash/dry hands, Wash/dry face, Oral care, Brushing hair, Min guard, Standing Grooming Details (indicate cue type and reason): washing hair with shampoo cap (set-up/S) while seated Upper Body Bathing: Set up, Supervision/ safety, Sitting Lower Body Bathing: Minimal assistance, Sit to/from stand Upper Body Dressing : Set up, Supervision/safety, Sitting Lower Body Dressing: Minimal assistance, Sit to/from stand Toilet Transfer: Minimal assistance, Ambulation, Comfort height toilet, Grab bars Toilet Transfer Details (indicate cue type and reason): no AD Toileting- Clothing Manipulation and Hygiene: Min guard, Sit to/from stand Functional mobility during ADLs: Moderate assistance (HHA; may do well with RW) General ADL Comments: impulsive at times; LOB during dynamic tasks    Mobility  Overal bed mobility: Needs Assistance Bed Mobility: Sidelying to Sit, Rolling Rolling: Min guard Sidelying to sit: Min assist Supine to sit: Min guard Sit to supine: Mod assist General bed mobility comments: Recevied pt from PT as they came back from ambulating    Transfers  Overall transfer level: Needs assistance Equipment used: None Transfers: Sit to/from Stand Sit to Stand: Min guard Stand pivot transfers: Mod assist General transfer comment: pt requires minG due to posterior lean upon standing    Ambulation / Gait / Stairs / Wheelchair Mobility  Ambulation/Gait Ambulation/Gait assistance: Herbalist (Feet): 200 Feet Assistive  device: None Gait Pattern/deviations: Step-through pattern, Drifts right/left General Gait Details: pt consistently drifting toward L side, PT providing tactile cues and physical assist to pull back toward middle however requires verbal cues to maintain midline with ambulation Gait velocity: functional Gait velocity interpretation: 1.31 - 2.62 ft/sec, indicative of limited community ambulator Stairs: Yes Stairs assistance: Min assist Stair Management: One rail Right Number of Stairs: 4    Posture / Balance Dynamic Sitting Balance Sitting balance - Comments: minA at end of session Balance Overall balance assessment: Needs assistance Sitting-balance support: Feet supported Sitting balance-Leahy Scale: Good Sitting balance - Comments: minA at end of session Standing balance support: No upper extremity supported, During functional activity Standing balance-Leahy Scale: Fair Standing balance comment: standing at sink to wash hands, wash face, brush hair    Special needs/care consideration Skin bil racoon's eyes, laceration from L temple to cheek, Diabetic management no and Designated visitor spouse [name]) and daughter (  [name])     Previous Home Environment (from acute therapy documentation) Living Arrangements: Spouse/significant other Available Help at Discharge: Family, Available 24 hours/day Type of Home: House Home Layout: One level Home Access: Stairs to enter Entrance Stairs-Rails: Right, Left, Can reach both Entrance Stairs-Number of Steps: 4-5 Bathroom Shower/Tub: Tub/shower unit, Multimedia programmer: Handicapped height Bathroom Accessibility: Yes How Accessible: Accessible via walker  Discharge Living Setting Plans for Discharge Living Setting: Patient's home Type of Home at Discharge: [place]: One level Discharge Home Access: Stairs to enter Entrance Stairs-Rails: Can reach both, Left, Right Entrance Stairs-Number of Steps: 5 Discharge  Bathroom Shower/Tub: Tub/shower unit, Walk-in shower Discharge Bathroom Toilet: Handicapped height Discharge Bathroom Accessibility: Yes How Accessible: Accessible via walker Does the patient have any problems obtaining your medications?: No  Social/Family/Support Systems Patient Roles: Spouse Contact Information: Chyler Creely [phone]) Anticipated Caregiver: spouse and daughters [name] and [place]) Anticipated Caregiver's Contact Information: [name] [phone] Ability/Limitations of Caregiver: [name] could potentially have some memory deficits of his own (based on observation)  Caregiver Availability: 24/7 Discharge Plan Discussed with Primary Caregiver: Yes Is Caregiver In Agreement with Plan?: Yes Does Caregiver/Family have Issues with Lodging/Transportation while Pt is in Rehab?: No   Goals Patient/Family Goal for Rehab: PT/OT supervision to mod I, SLP supervision Expected length of stay: 7-10 Additional Information: Designated visitors: spouse and [name] are designated visitors Pt/Family Agrees to Admission and willing to participate: Yes Program Orientation Provided & Reviewed with Pt/Caregiver Including Roles  & Responsibilities: Yes  Barriers to Discharge: Insurance for SNF coverage   Decrease burden of Care through IP rehab admission: n/a  Possible need for SNF placement upon discharge: Not anticipated  Patient Condition: This patient's medical and functional status has changed since the consult dated: 7/20 in which the Rehabilitation Physician determined and documented that the patient's condition is appropriate for intensive rehabilitative care in an inpatient rehabilitation facility. See "History of Present Illness" (above) for medical update. Functional changes are: min/mod assist 200'. Patient's medical and functional status update has been discussed with the Rehabilitation physician and patient remains appropriate for inpatient rehabilitation. Will  admit to inpatient rehab today.  Preadmission Screen Completed By:  Michel Santee, PT, DPT 05/04/2020 10:51 AM ______________________________________________________________________   Discussed status with Dr. Posey Pronto on 05/04/20 at 05/04/20 and received approval for admission today.  Admission Coordinator:  Michel Santee, PT, DPT time 10:51 AM Sudie Grumbling 05/04/20

## 2020-05-02 NOTE — Progress Notes (Signed)
Attempted to call patient's daughter, [name], no answer.   Norm Parcel , [facility] Surgery 05/02/2020, 2:12 PM Please see Amion for pager number during day hours 7:00am-4:30pm

## 2020-05-02 NOTE — Telephone Encounter (Signed)
Caller: [name] (daughter) Call back phone number: [phone]   [name] call to informed you the [name] sustained a fall while at church 04/29/20. The patient is currently in ICU due to a brain injury sustained from the fall. Daughter is not happy with the care at the current hospital. The daughter would like to transfer to [facility], however, she receiving resistant from trauma doctor.

## 2020-05-02 NOTE — Progress Notes (Signed)
Clinical update provided late this evening to the patient's daughter. Lengthy conversation with multiple issues raised, including concerns regarding atrial fibrillation, although the patient's most recent EKG shows normal sinus rhythm. Daughter is very concerned about identification of an etiology of the patient's syncopal episode and history of dizziness. Explained that workup thus far has been negative, to include echo, EEG, TSH. Daughter verbalizes that she has already reviewed the results of these diagnostic studies She raises concerns about the patient's creatinine and GFR being abnormal and was informed that the patient's current creatinine and GFR are consistent with 2020 labwork. She states concern that there has not been any repeat labwork performed and was informed that her labwork was performed on 7/18, repeated on 7/19, and that she has no medical indication to continue with daily lab draws. Due to family concerns, will re-draw labs in AM. She reports no understanding of what to expect regarding the patient's brain injury to include when blood will reabsorb, expected prognosis, and and will re-consult NSGY to provide prognostication. Daughter verbalizes concern regarding the patient not having received an ophthalmology consultation. Explained that the patient does not currently endorse any visual complaints and Dr. Redmond Baseman documents no visual concerns on exam. Daughter states that the patient does not endorse any visual complaints, however she remains concerned for ocular injury, specifically retinal detachment. She states she has spoken with a family friend who works at [employer] who states that the patient "should of course" get an ophthalmology examination. Will consult ophtho in the AM to rule out retinal detachment per family request. Daughter states she has not been kept abreast of the patient's physical therapy progress. Daughter made aware that the patient had her first physical therapy evaluation  today, so there is no progress to assess after a single evaluation. Also informed the daughter that progress is more meaninful when assessed week over week instead of day over day. Encouraged daughter to continue to utilize her access to MyChart to review documentation and notes in the chart, including therapy assessments in response to her reports of feeling like family is "in the dark", "we need answers", "we can't get answers", and "it's very frustrating".   Total time spent on the phone: 36 minutes

## 2020-05-02 NOTE — Progress Notes (Signed)
Carotid duplex has been completed.   Preliminary results in CV Proc.   Abram Sander 05/02/2020 11:44 AM

## 2020-05-02 NOTE — Progress Notes (Signed)
Physical Therapy Treatment Patient Details Name: [name] MRN: [id number] DOB: June 30, 1931 Today's Date: 05/02/2020    History of Present Illness 86 y.o. female with medical history significant of L breast cancer s/p chemo/rads in 2010/2011; HTN; hypothyroidism; and HLD presenting after an unwitnessed fall at church. She has a prior h/o delusions and paranoid thinking in early 2020.  She was subsequently hospitalized for elevated BP (possibly related to anxiety) and had an unremarkable MRI.  More recently, she reported difficulty with transposition of numbers and possible hemianopsia of the left eye with ?neglect but without vision loss (20/20 vision since cataract surgery).  She may have mild baseline cognitive impairment but also has unawareness of her surroundings at times - gets lost while driving, falls frequently while not paying attention.     PT Comments    Pt initially tolerating treatment well, ambulating for increased distances and negotiating stairs this session. During ambulation pt reports beginning to feel tired and later a little more unsteady. PT assists pt to recliner where she subsequently reports a HA and nausea, along with generally feeling unwell. Pt is noted to be hypertensive, RN made aware. PT assists pt back to bed, requiring increased physical assistance at this time compared to previous mobility during this session. Pt will benefit from continued acute PT POC to improve balance and gait quality and to reduce falls risk. PT continues to recommend CIR placement at this time as the pt's performance appears to be inconsistent and she will benefit from continued aggressive therapies to aide in a return to independence.   Follow Up Recommendations  CIR     Equipment Recommendations  Rolling walker with 5" wheels    Recommendations for Other Services       Precautions / Restrictions Precautions Precautions: Fall Restrictions Weight Bearing Restrictions: No     Mobility  Bed Mobility Overal bed mobility: Needs Assistance Bed Mobility: Sit to Supine Rolling: Min assist     Sit to supine: Mod assist      Transfers Overall transfer level: Needs assistance Equipment used: None Transfers: Sit to/from Stand Sit to Stand: Supervision;Mod assist Stand pivot transfers: Mod assist       General transfer comment: pt initially requiring supervision for sit to stand at beginning of session, at end of session needing modA once pt reporting symptoms of nausea, pain, HA  Ambulation/Gait Ambulation/Gait assistance: Min assist Gait Distance (Feet): 250 Feet Assistive device: 1 person hand held assist Gait Pattern/deviations: Step-through pattern Gait velocity: reduced Gait velocity interpretation: <1.8 ft/sec, indicate of risk for recurrent falls General Gait Details: pt with intermittent drift left or right, requires hand hold to steady, one LOB without hand hold   Stairs Stairs: Yes Stairs assistance: Min assist Stair Management: One rail Right Number of Stairs: 4     Wheelchair Mobility    Modified Rankin (Stroke Patients Only) Modified Rankin (Stroke Patients Only) Pre-Morbid Rankin Score: No significant disability Modified Rankin: Moderately severe disability     Balance Overall balance assessment: Needs assistance Sitting-balance support: Single extremity supported;Feet supported Sitting balance-Leahy Scale: Poor Sitting balance - Comments: minA at end of session   Standing balance support: Single extremity supported Standing balance-Leahy Scale: Poor Standing balance comment: reliant on unilateral UE support for dynamic standing activity                            Cognition Arousal/Alertness: Awake/alert Behavior During Therapy: WFL for tasks assessed/performed Overall  Cognitive Status: Impaired/Different from baseline Area of Impairment: Attention;Memory;Awareness;Problem solving                    Current Attention Level: Sustained Memory: Decreased recall of precautions   Safety/Judgement: Decreased awareness of safety;Decreased awareness of deficits Awareness: Emergent          Exercises      General Comments General comments (skin integrity, edema, etc.): pt reports dizziness, HA, nausea, back spasms at end of session and requires increased assistance for balance and transfers. PT noted to be hypertensive to 176/89 from 159/79 pre-mobility, RN made aware. PT does note some L beating nystagmus with R gaze during session      Pertinent Vitals/Pain Pain Assessment: 0-10 Pain Score: 8  Pain Location: back Pain Descriptors / Indicators: Grimacing Pain Intervention(s): RN gave pain meds during session    Home Living                      Prior Function            PT Goals (current goals can now be found in the care plan section) Acute Rehab PT Goals Patient Stated Goal: to feel better Progress towards PT goals: Progressing toward goals    Frequency    Min 4X/week      PT Plan Current plan remains appropriate    Co-evaluation              AM-PAC PT "6 Clicks" Mobility   Outcome Measure  Help needed turning from your back to your side while in a flat bed without using bedrails?: A Little Help needed moving from lying on your back to sitting on the side of a flat bed without using bedrails?: A Little Help needed moving to and from a bed to a chair (including a wheelchair)?: A Little Help needed standing up from a chair using your arms (e.g., wheelchair or bedside chair)?: A Little Help needed to walk in hospital room?: A Little Help needed climbing 3-5 steps with a railing? : A Lot 6 Click Score: 17    End of Session   Activity Tolerance: Patient limited by pain;Treatment limited secondary to medical complications (Comment) Patient left: in bed;with call bell/phone within reach;with bed alarm set;with nursing/sitter in room Nurse  Communication: Mobility status PT Visit Diagnosis: Unsteadiness on feet (R26.81);Muscle weakness (generalized) (M62.81);History of falling (Z91.81)     Time: 2620-3559 PT Time Calculation (min) (ACUTE ONLY): 25 min  Charges:  $Gait Training: 8-22 mins $Therapeutic Activity: 8-22 mins                     Zenaida Niece, PT, DPT Acute Rehabilitation Pager: [phone]    Zenaida Niece 05/02/2020, 4:24 PM
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
## 2020-05-02 NOTE — TOC Initial Note (Signed)
Transition of Care [facility]) - Initial/Assessment Note    Patient Details  Name: [name] MRN: [id number] Date of Birth: 1931-08-10  [facility] CM/SW Contact:    Ella Bodo, RN Phone Number: 05/02/2020, 3:02 PM  Clinical Narrative: Patient admitted on 04/29/2020 status post fall at church, sustaining left superolateral orbit fracture, bilateral subarachnoid hemorrhage, intraparenchymal hemorrhage, and left forehead/cheek laceration.  Prior to admission, patient independent and living at home with husband.  She has 2 adult daughters who are involved with her care.  PT/OT recommending CIR prior to discharge home; rehab consult in progress.  Initially, patient's daughters interested in outside rehab facility options.  I spoke with patient's daughter [name], with patient permission; she states that now family has decided that patient should stay at [facility] for her rehab.  Notified CIR admissions coordinator, Urban Gibson, that patient and family prefer to stay at [facility] for rehab needs.                Expected Discharge Plan: IP Rehab Facility Barriers to Discharge: Continued Medical Work up   Patient Goals and CMS Choice        Expected Discharge Plan and Services Expected Discharge Plan: [place]   Discharge Planning Services: CM Consult   Living arrangements for the past 2 months: Single Family Home                                      Prior Living Arrangements/Services Living arrangements for the past 2 months: Single Family Home Lives with:: Spouse Patient language and need for interpreter reviewed:: Yes Do you feel safe going back to the place where you live?: Yes      Need for Family Participation in Patient Care: Yes (Comment) Care giver support system in place?: Yes (comment)   Criminal Activity/Legal Involvement Pertinent to Current Situation/Hospitalization: No - Comment as needed  Activities of Daily Living      Permission  Sought/Granted   Permission granted to share information with : Yes, Verbal Permission Granted  Share Information with NAME: Park Liter     Permission granted to share info w Relationship: daughter  Permission granted to share info w Contact Information: [phone]  Emotional Assessment Appearance:: Appears stated age Attitude/Demeanor/Rapport: Engaged Affect (typically observed): Accepting Orientation: : Oriented to Self, Oriented to Place, Oriented to  Time, Oriented to Situation      Admission diagnosis:  Intracranial hemorrhage (HCC) [I62.9] SAH (subarachnoid hemorrhage) ([place]) [I60.9] Facial laceration, initial encounter [S01.81XA] Closed fracture of orbit, initial encounter ([place]) [S02.85XA] Fall, initial encounter [W19.XXXA] Patient Active Problem List   Diagnosis Date Noted  . SAH (subarachnoid hemorrhage) ([place]) 04/29/2020  . Fall at home, initial encounter 04/29/2020  . Orbital fracture, closed, initial encounter ([place]) 04/29/2020  . Basal cell carcinoma (BCC) in situ of skin 04/04/2020  . Essential hypertension 03/21/2019  . Stage 3a chronic kidney disease   . Systolic murmur 21/22/4825  . Hypercalcemia   . AKI (acute kidney injury) ([place])   . Dizziness 02/22/2019  . Squamous cell carcinoma in situ of skin 11/30/2018  . Diarrhea 09/08/2017  . Nausea vomiting and diarrhea 09/08/2017  . Elevated serum creatinine 09/08/2017  . PVC (premature ventricular contraction) 09/08/2017  . Glaucoma 09/08/2017  . Hx of meningioma of the brain 08/28/2017  . Hoarseness of voice 02/28/2016  . Raynaud's phenomenon 02/28/2016  . Esophageal spasm 09/20/2013  .  Osteoporosis 03/18/2012  . Fracture, shoulder 03/06/2012  . Hyperlipidemia   . Breast cancer ([place])    PCP:  Copland, Gay Filler, MD Pharmacy:   [id number] - [place], [place] - [address] AT [place] [address] [place] [address] Phone: [phone] Fax:  [phone]  [facility] [place] [address] Phone: [phone] Fax: [phone]     Social Determinants of Health (SDOH) Interventions    Readmission Risk Interventions No flowsheet data found.  Reinaldo Raddle, RN, BSN  Trauma/Neuro ICU Case Manager [phone]

## 2020-05-03 LAB — BASIC METABOLIC PANEL
Anion gap: 10 (ref 5–15)
BUN: 13 mg/dL (ref 8–23)
CO2: 21 mmol/L — ABNORMAL LOW (ref 22–32)
Calcium: 8.5 mg/dL — ABNORMAL LOW (ref 8.9–10.3)
Chloride: 106 mmol/L (ref 98–111)
Creatinine, Ser: 0.91 mg/dL (ref 0.44–1.00)
GFR calc Af Amer: >60 mL/min (ref >60)
GFR calc non Af Amer: 57 mL/min — ABNORMAL LOW (ref >60)
Glucose, Bld: 99 mg/dL (ref 70–99)
Potassium: 4.8 mmol/L (ref 3.5–5.1)
Sodium: 137 mmol/L (ref 135–145)

## 2020-05-03 LAB — CBC
HCT: 33.4 % — ABNORMAL LOW (ref 36.0–46.0)
Hemoglobin: 10.9 g/dL — ABNORMAL LOW (ref 12.0–15.0)
MCH: 31.1 pg (ref 26.0–34.0)
MCHC: 32.6 g/dL (ref 30.0–36.0)
MCV: 95.4 fL (ref 80.0–100.0)
Platelets: 186 10*3/uL (ref 150–400)
RBC: 3.5 MIL/uL — ABNORMAL LOW (ref 3.87–5.11)
RDW: 15.2 % (ref 11.5–15.5)
WBC: 7.7 10*3/uL (ref 4.0–10.5)
nRBC: 0 % (ref 0.0–0.2)

## 2020-05-03 LAB — PHOSPHORUS: Phosphorus: 3.5 mg/dL (ref 2.5–4.6)

## 2020-05-03 LAB — MAGNESIUM: Magnesium: 1.6 mg/dL — ABNORMAL LOW (ref 1.7–2.4)

## 2020-05-03 MED ORDER — MAGNESIUM OXIDE 400 (241.3 MG) MG PO TABS
400.0000 mg | ORAL_TABLET | Freq: Two times a day (BID) | ORAL | Status: AC
  Administered 2020-05-03 (×2): 400 mg via ORAL
  Filled 2020-05-03 (×2): qty 1

## 2020-05-03 MED ORDER — POLYETHYLENE GLYCOL 3350 17 G PO PACK
17.0000 g | PACK | Freq: Every day | ORAL | Status: DC | PRN
  Administered 2020-05-04: 17 g via ORAL
  Filled 2020-05-03: qty 1

## 2020-05-03 MED ORDER — AMLODIPINE BESYLATE 2.5 MG PO TABS
2.5000 mg | ORAL_TABLET | Freq: Every day | ORAL | Status: DC
  Administered 2020-05-03 – 2020-05-05 (×3): 2.5 mg via ORAL
  Filled 2020-05-03 (×3): qty 1

## 2020-05-03 NOTE — Progress Notes (Signed)
Documentation indicates that the patient arrived to 4NP04 at 1748. This RN assessed the patient at 16. The patient was admitted to tele and oriented to the room. Seizure precautions including oxygen and suction set up at bedside, floor mats in place, call bell within reach, and bed set in lowest position with alarm on. Patient resting in bed now.

## 2020-05-03 NOTE — Progress Notes (Signed)
[facility] Progress Note     Subjective: CC-  Up in chair. Reports a mild headache this morning, pain medication does help. She does still feel dizzy at times, usually with movement.  Tolerating diet. No BM in a couple days but passing flatus and denies n/v.  Objective: Vital signs in last 24 hours: Temp:  [97.7 F (36.5 C)-98.2 F (36.8 C)] 97.9 F (36.6 C) (07/22 0800) Pulse Rate:  [51-78] 68 (07/22 0700) Resp:  [11-24] 21 (07/22 0700) BP: (83-181)/(54-106) 152/106 (07/22 0800) SpO2:  [94 %-100 %] 98 % (07/22 0700) Last BM Date: 04/30/20  Intake/Output from previous day: 07/21 0701 - 07/22 0700 In: 1009.9 [P.O.:960; I.V.:49.9] Out: -  Intake/Output this shift: No intake/output data recorded.  PE: Gen: Alert, NAD, pleasant HEENT:Bilateral periorbital ecchymosis and swelling.EOM's intactwithout entrapment. PERRL. Left forehead and cheek laceration with sutures in place, c/d/i. Card: RRR Pulm: CTAB, no W/R/R, effort normal Abd: Soft, NT/ND, +BS DDU:KGURKY soft and nontender without edema Psych: A&Ox4 Neuro: Non-focal. CN 3-12 grossly intact. Moves all extremities. Skin: no rashes noted, warm and dry   Lab Results:  Recent Labs    05/02/20 0403 05/03/20 0426  WBC 7.3 7.7  HGB 9.6* 10.9*  HCT 30.6* 33.4*  PLT 154 186   BMET Recent Labs    05/02/20 0403 05/03/20 0426  NA 139 137  K 4.2 4.8  CL 109 106  CO2 23 21*  GLUCOSE 91 99  BUN 12 13  CREATININE 1.03* 0.91  CALCIUM 8.4* 8.5*   PT/INR No results for input(s): LABPROT, INR in the last 72 hours. CMP     Component Value Date/Time   NA 137 05/03/2020 0426   NA 140 03/18/2013 0900   K 4.8 05/03/2020 0426   K 4.4 03/18/2013 0900   CL 106 05/03/2020 0426   CL 105 03/18/2013 0900   CO2 21 (L) 05/03/2020 0426   CO2 25 03/18/2013 0900   GLUCOSE 99 05/03/2020 0426   GLUCOSE 95 03/18/2013 0900   BUN 13 05/03/2020 0426   BUN 20.2 03/18/2013 0900   CREATININE 0.91 05/03/2020  0426   CREATININE 1.10 (H) 08/20/2017 1544   CREATININE 1.0 03/18/2013 0900   CALCIUM 8.5 (L) 05/03/2020 0426   CALCIUM 9.2 02/23/2019 0746   CALCIUM 9.6 03/18/2013 0900   PROT 5.8 (L) 04/30/2020 0416   PROT 6.7 03/18/2013 0900   ALBUMIN 3.0 (L) 04/30/2020 0416   ALBUMIN 3.3 (L) 03/18/2013 0900   AST 31 04/30/2020 0416   AST 21 03/18/2013 0900   ALT 23 04/30/2020 0416   ALT 12 03/18/2013 0900   ALKPHOS 72 04/30/2020 0416   ALKPHOS 80 03/18/2013 0900   BILITOT 0.8 04/30/2020 0416   BILITOT 0.36 03/18/2013 0900   GFRNONAA 57 (L) 05/03/2020 0426   GFRNONAA 51 (L) 02/28/2016 0853   GFRAA >60 05/03/2020 0426   GFRAA 58 (L) 02/28/2016 0853   Lipase     Component Value Date/Time   LIPASE 27 09/08/2017 1126       Studies/Results: VAS US CAROTID  Result Date: 05/02/2020 Carotid Arterial Duplex Study Indications:       Syncope. Risk Factors:      Hypertension, hyperlipidemia. Comparison Study:  no prior Performing Technologist: Abram Sander RVS  Examination Guidelines: A complete evaluation includes B-mode imaging, spectral Doppler, color Doppler, and power Doppler as needed of all accessible portions of each vessel. Bilateral testing is considered an integral part of a complete examination. Limited examinations for reoccurring indications  may be performed as noted.  Right Carotid Findings: +----------+--------+--------+--------+------------------+--------+           PSV cm/sEDV cm/sStenosisPlaque DescriptionComments +----------+--------+--------+--------+------------------+--------+ CCA Prox  86      16              heterogenous               +----------+--------+--------+--------+------------------+--------+ CCA Distal63      12              heterogenous               +----------+--------+--------+--------+------------------+--------+ ICA Prox  76      22      1-39%   heterogenous               +----------+--------+--------+--------+------------------+--------+  ICA Distal76      22                                         +----------+--------+--------+--------+------------------+--------+ ECA       76      7                                          +----------+--------+--------+--------+------------------+--------+ +----------+--------+-------+--------+-------------------+           PSV cm/sEDV cmsDescribeArm Pressure (mmHG) +----------+--------+-------+--------+-------------------+ [id number]                                         +----------+--------+-------+--------+-------------------+ +---------+--------+--+--------+--+---------+ VertebralPSV cm/s44EDV cm/s10Antegrade +---------+--------+--+--------+--+---------+  Left Carotid Findings: +----------+--------+--------+--------+------------------+--------+           PSV cm/sEDV cm/sStenosisPlaque DescriptionComments +----------+--------+--------+--------+------------------+--------+ CCA Prox  72      19              heterogenous               +----------+--------+--------+--------+------------------+--------+ CCA Distal69      18              heterogenous               +----------+--------+--------+--------+------------------+--------+ ICA Prox  76      25      1-39%   heterogenous               +----------+--------+--------+--------+------------------+--------+ ICA Distal55      14                                         +----------+--------+--------+--------+------------------+--------+ ECA       69      13                                         +----------+--------+--------+--------+------------------+--------+ +----------+--------+--------+--------+-------------------+           PSV cm/sEDV cm/sDescribeArm Pressure (mmHG) +----------+--------+--------+--------+-------------------+ [id number]                                          +----------+--------+--------+--------+-------------------+  +---------+--------+--+--------+-+---------+  VertebralPSV cm/s35EDV cm/s7Antegrade +---------+--------+--+--------+-+---------+   Summary: Right Carotid: Velocities in the right ICA are consistent with a 1-39% stenosis. Left Carotid: Velocities in the left ICA are consistent with a 1-39% stenosis. Vertebrals: Bilateral vertebral arteries demonstrate antegrade flow. *See table(s) above for measurements and observations.  Electronically signed by Ruta Hinds MD on 05/02/2020 at 6:13:43 PM.    Final     Anti-infectives: Anti-infectives (From admission, onward)   None       Assessment/Plan Status post fall, unwitnessed  Left, superolateral orbital fracture-ENT c/s,Dr. Redmond Baseman, non-op management, noentrapment, no visual symptoms Periorbital ecchymosis and swelling-supportive care Bilateral subarachnoid hemorrhage, intraparenchymal hemorrhage- Per Neurosurgery, Dr. Ronnald Ramp. Follow up [place] stable 7/19.Keppra BIDx7dfor seizure prophylaxis.TBI therapies. History of falls- Blood sugar normal on presentation. On cardiac monitoring. EKG on admission NSR. Labs overall unremarkable. No urinary symptoms.UAnegative.TRH consulted forsyncopal w/u, negative thus far. EEG 7/19 negative, TSH normal, echo normal.Carotid duplex does not show any significant stenosis, most recent in 2018 with plaque at b/l bifurcations.Recommending delayed MRI of the brain when SAH/IPH stabilized. Recommendsneuropsychiatricas outpatient.  Left forehead/cheek laceration- s/p repair in ED by EDP (absorbable sutures) History of breast cancer CKD- Baseline ~1.06. Cr 0.91. monitor Hx HTN- home meds on hold, TRH started norvasc 2.5mg  today. PRNHydralazine Hx HLD- Continue home meds HxHypothyroidism- TSH wnl. Continue home meds  FEN -HH, Ensure, SLIVF, replace Mag VTE -SCDs, sq heparin Foley - good UOP  Dispo - Awaiting transfer to 4[facility]. Continue TBI therapies. CIR following, medically stable for  discharge when bed available.  Lives at home with her husband. Daughter lives in [place].    LOS: 4 days    Wellington Hampshire, [facility] 05/03/2020, 9:53 AM Please see Amion for pager number during day hours 7:00am-4:30pm

## 2020-05-03 NOTE — Progress Notes (Signed)
Inpatient Rehab Admissions Coordinator:   Patient's family agreeable to [facility] CIR.  Will start insurance auth process today.   Shann Medal, PT, DPT Admissions Coordinator [phone] 05/03/20  3:27 PM
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
## 2020-05-03 NOTE — Progress Notes (Signed)
Occupational Therapy Treatment Patient Details Name: [name] MRN: [id number] DOB: 1931/06/03 Today's Date: 05/03/2020    History of present illness 86 y.o. female with medical history significant of L breast cancer s/p chemo/rads in 2010/2011; HTN; hypothyroidism; and HLD presenting after an unwitnessed fall at church with resultant left, superolateral orbital fracture- non-op management; periorbital ecchymosis and swelling - supportive care; bilateral subarachnoid hemorrhage, intraparenchymal hemorrhage -follow up CT and Keppra BID x7d for seizure prophylaxis; TRH consulted for syncopal w/u, negative thus far; left forehead/cheek laceration. She has a prior h/o delusions and paranoid thinking in early 2020.  She was subsequently hospitalized for elevated BP (possibly related to anxiety) and had an unremarkable MRI.  More recently, she reported difficulty with transposition of numbers and possible hemianopsia of the left eye with ?neglect but without vision loss (20/20 vision since cataract surgery).  She may have mild baseline cognitive impairment but also has unawareness of her surroundings at times - gets lost while driving, falls frequently while not paying attention.   OT comments  This 86 yo female admitted with above presents to acute OT with making progress with grooming and toileting. She was able to stand at sink for 3 grooming tasks and sitting for one. She processed through the tasks without cues. She was mildly unbalanced while standing requiring min guard A, but never lost her balance. While trying to ambulate without AD (as per her baseline) pt tends to reach out for objects to hold onto (unsafe). She will continue to benefit from acute OT with follow up at OT on CIR to get back to her baseline sooner than later.  Follow Up Recommendations  CIR;Supervision/Assistance - 24 hour    Equipment Recommendations  3 in 1 bedside commode       Precautions / Restrictions  Precautions Precautions: Fall Restrictions Weight Bearing Restrictions: No       Mobility Bed Mobility   General bed mobility comments: Recevied pt from PT as they came back from ambulating  Transfers Overall transfer level: Needs assistance Equipment used: None Transfers: Sit to/from Stand Sit to Stand: Min guard            Balance Overall balance assessment: Needs assistance Sitting-balance support: Feet supported Sitting balance-Leahy Scale: Good     Standing balance support: No upper extremity supported;During functional activity Standing balance-Leahy Scale: Fair Standing balance comment: standing at sink to wash hands, wash face, brush hair                           ADL either performed or assessed with clinical judgement   ADL Overall ADL's : Needs assistance/impaired     Grooming: Wash/dry hands;Wash/dry face;Oral care;Brushing hair;Min guard;Standing Grooming Details (indicate cue type and reason): washing hair with shampoo cap (set-up/S) while seated                 Toilet Transfer: Minimal assistance;Ambulation;Comfort height toilet;Grab bars Toilet Transfer Details (indicate cue type and reason): no AD Toileting- Clothing Manipulation and Hygiene: Min guard;Sit to/from stand               Vision Baseline Vision/History: Glaucoma;Wears glasses Wears Glasses: Reading only            Cognition Arousal/Alertness: Awake/alert Behavior During Therapy: WFL for tasks assessed/performed Overall Cognitive Status: Impaired/Different from baseline Area of Impairment: Memory;Safety/judgement                   Memory:  Decreased short-term memory   Safety/Judgement: Decreased awareness of safety                General Comments pt reports aching joints and wants to be able to move more as she is normally very active    Pertinent Vitals/ Pain       Pain Assessment: Faces Faces Pain Scale: Hurts little more Pain  Location: back Pain Descriptors / Indicators: Grimacing Pain Intervention(s): Monitored during session;Repositioned     Prior Functioning/Environment              Frequency  Min 2X/week        Progress Toward Goals  OT Goals(current goals can now be found in the care plan section)  Progress towards OT goals: Progressing toward goals  Acute Rehab OT Goals Patient Stated Goal: to feel better         AM-PAC OT "6 Clicks" Daily Activity     Outcome Measure   Help from another person eating meals?: None Help from another person taking care of personal grooming?: A Little Help from another person toileting, which includes using toliet, bedpan, or urinal?: A Little Help from another person bathing (including washing, rinsing, drying)?: A Little Help from another person to put on and taking off regular upper body clothing?: A Little Help from another person to put on and taking off regular lower body clothing?: A Little 6 Click Score: 19    End of Session    OT Visit Diagnosis: Unsteadiness on feet (R26.81);Other abnormalities of gait and mobility (R26.89);Other symptoms and signs involving cognitive function;Pain Pain - part of body:  (back)   Activity Tolerance Patient tolerated treatment well   Patient Left in chair;with call bell/phone within reach;with chair alarm set;with family/visitor present   Nurse Communication          Time: 1411-1430 OT Time Calculation (min): 19 min  Charges: OT General Charges $OT Visit: 1 Visit OT Treatments $Self Care/Home Management : 8-22 mins  [address], OTR/L Acute [employer] Pager [phone] Office [phone]      Almon Register 05/03/2020, 2:52 PM

## 2020-05-03 NOTE — Progress Notes (Signed)
Physical Therapy Treatment Patient Details Name: [name] MRN: [id number] DOB: 1931-09-10 Today's Date: 05/03/2020    History of Present Illness 86 y.o. female with medical history significant of L breast cancer s/p chemo/rads in 2010/2011; HTN; hypothyroidism; and HLD presenting after an unwitnessed fall at church. She has a prior h/o delusions and paranoid thinking in early 2020.  She was subsequently hospitalized for elevated BP (possibly related to anxiety) and had an unremarkable MRI.  More recently, she reported difficulty with transposition of numbers and possible hemianopsia of the left eye with ?neglect but without vision loss (20/20 vision since cataract surgery).  She may have mild baseline cognitive impairment but also has unawareness of her surroundings at times - gets lost while driving, falls frequently while not paying attention.     PT Comments    Pt tolerates treatment well, ambulating for household and limited community distances. Pt demonstrates reduced awareness of balance/gait deficits with left drift throughout gait and posterior lean during much of standing activity. Pt also with impaired short term memory noted multiple times during session. Pt will benefit from continued acute PT POC to improve balance and restore independence in mobility. PT continues to recommend CIR as the pt is very active at baseline and will benefit from high intensity inpatient PT services to restore her prior level of function.  Follow Up Recommendations  CIR     Equipment Recommendations  Rolling walker with 5" wheels    Recommendations for Other Services       Precautions / Restrictions Precautions Precautions: Fall Restrictions Weight Bearing Restrictions: No    Mobility  Bed Mobility Overal bed mobility: Needs Assistance Bed Mobility: Sidelying to Sit;Rolling Rolling: Min guard Sidelying to sit: Min assist          Transfers Overall transfer level: Needs  assistance Equipment used: None Transfers: Sit to/from Stand Sit to Stand: Min guard         General transfer comment: pt requires minG due to posterior lean upon standing  Ambulation/Gait Ambulation/Gait assistance: Min assist Gait Distance (Feet): 200 Feet Assistive device: None Gait Pattern/deviations: Step-through pattern;Drifts right/left Gait velocity: functional Gait velocity interpretation: 1.31 - 2.62 ft/sec, indicative of limited community ambulator General Gait Details: pt consistently drifting toward L side, PT providing tactile cues and physical assist to pull back toward middle however requires verbal cues to maintain midline with ambulation   Stairs             Wheelchair Mobility    Modified Rankin (Stroke Patients Only) Modified Rankin (Stroke Patients Only) Pre-Morbid Rankin Score: No significant disability Modified Rankin: Moderately severe disability     Balance Overall balance assessment: Needs assistance Sitting-balance support: No upper extremity supported;Feet supported Sitting balance-Leahy Scale: Fair     Standing balance support: No upper extremity supported;During functional activity Standing balance-Leahy Scale: Poor Standing balance comment: minA to maintain static standing balance, left and posterior preference                            Cognition Arousal/Alertness: Awake/alert Behavior During Therapy: WFL for tasks assessed/performed Overall Cognitive Status: Impaired/Different from baseline Area of Impairment: Memory;Attention;Safety/judgement;Awareness;Problem solving                   Current Attention Level: Sustained Memory: Decreased recall of precautions;Decreased short-term memory   Safety/Judgement: Decreased awareness of safety;Decreased awareness of deficits Awareness: Emergent Problem Solving: Requires verbal cues  Exercises      General Comments General comments (skin integrity,  edema, etc.): pt reports aching joints and wants to be able to move more as she is normally very active      Pertinent Vitals/Pain Pain Assessment: Faces Faces Pain Scale: Hurts even more Pain Location: back Pain Descriptors / Indicators: Grimacing Pain Intervention(s): Monitored during session    Home Living                      Prior Function            PT Goals (current goals can now be found in the care plan section) Acute Rehab PT Goals Patient Stated Goal: to feel better Progress towards PT goals: Progressing toward goals    Frequency    Min 4X/week      PT Plan Current plan remains appropriate    Co-evaluation              AM-PAC PT "6 Clicks" Mobility   Outcome Measure  Help needed turning from your back to your side while in a flat bed without using bedrails?: A Little Help needed moving from lying on your back to sitting on the side of a flat bed without using bedrails?: A Little Help needed moving to and from a bed to a chair (including a wheelchair)?: A Little Help needed standing up from a chair using your arms (e.g., wheelchair or bedside chair)?: A Little Help needed to walk in hospital room?: A Little Help needed climbing 3-5 steps with a railing? : A Little 6 Click Score: 18    End of Session   Activity Tolerance: Patient tolerated treatment well Patient left:  (standing in room with OT present) Nurse Communication: Mobility status PT Visit Diagnosis: Unsteadiness on feet (R26.81);Muscle weakness (generalized) (M62.81);History of falling (Z91.81)     Time: 3903-0092 PT Time Calculation (min) (ACUTE ONLY): 12 min  Charges:  $Gait Training: 8-22 mins                     Zenaida Niece, PT, DPT Acute Rehabilitation Pager: [phone]    Zenaida Niece 05/03/2020, 2:20 PM

## 2020-05-04 ENCOUNTER — Inpatient Hospital Stay (HOSPITAL_COMMUNITY): Payer: Medicare Other

## 2020-05-04 DIAGNOSIS — M549 Dorsalgia, unspecified: Secondary | ICD-10-CM

## 2020-05-04 DIAGNOSIS — S069XAA Unspecified intracranial injury with loss of consciousness status unknown, initial encounter: Secondary | ICD-10-CM

## 2020-05-04 DIAGNOSIS — R55 Syncope and collapse: Secondary | ICD-10-CM

## 2020-05-04 DIAGNOSIS — S069X9A Unspecified intracranial injury with loss of consciousness of unspecified duration, initial encounter: Secondary | ICD-10-CM

## 2020-05-04 DIAGNOSIS — R001 Bradycardia, unspecified: Secondary | ICD-10-CM

## 2020-05-04 HISTORY — DX: Dorsalgia, unspecified: M54.9

## 2020-05-04 HISTORY — DX: Unspecified intracranial injury with loss of consciousness status unknown, initial encounter: S06.9XAA

## 2020-05-04 LAB — CBC
HCT: 33.3 % — ABNORMAL LOW (ref 36.0–46.0)
Hemoglobin: 10.7 g/dL — ABNORMAL LOW (ref 12.0–15.0)
MCH: 29.9 pg (ref 26.0–34.0)
MCHC: 32.1 g/dL (ref 30.0–36.0)
MCV: 93 fL (ref 80.0–100.0)
Platelets: 208 10*3/uL (ref 150–400)
RBC: 3.58 MIL/uL — ABNORMAL LOW (ref 3.87–5.11)
RDW: 15.2 % (ref 11.5–15.5)
WBC: 9 10*3/uL (ref 4.0–10.5)
nRBC: 0 % (ref 0.0–0.2)

## 2020-05-04 LAB — BASIC METABOLIC PANEL
Anion gap: 9 (ref 5–15)
BUN: 12 mg/dL (ref 8–23)
CO2: 25 mmol/L (ref 22–32)
Calcium: 9 mg/dL (ref 8.9–10.3)
Chloride: 103 mmol/L (ref 98–111)
Creatinine, Ser: 0.95 mg/dL (ref 0.44–1.00)
GFR calc Af Amer: >60 mL/min (ref >60)
GFR calc non Af Amer: 54 mL/min — ABNORMAL LOW (ref >60)
Glucose, Bld: 126 mg/dL — ABNORMAL HIGH (ref 70–99)
Potassium: 4 mmol/L (ref 3.5–5.1)
Sodium: 137 mmol/L (ref 135–145)

## 2020-05-04 LAB — MAGNESIUM: Magnesium: 1.7 mg/dL (ref 1.7–2.4)

## 2020-05-04 LAB — PHOSPHORUS: Phosphorus: 3.7 mg/dL (ref 2.5–4.6)

## 2020-05-04 MED ORDER — LIDOCAINE 5 % EX PTCH
1.0000 | MEDICATED_PATCH | CUTANEOUS | Status: DC
  Administered 2020-05-04: 1 via TRANSDERMAL
  Filled 2020-05-04: qty 1

## 2020-05-04 MED ORDER — BISACODYL 10 MG RE SUPP: 10.0000 mg | Freq: Every day | RECTAL | Status: DC | PRN

## 2020-05-04 MED ORDER — POLYETHYLENE GLYCOL 3350 17 G PO PACK
17.0000 g | PACK | Freq: Every day | ORAL | Status: DC
  Administered 2020-05-05: 17 g via ORAL
  Filled 2020-05-04: qty 1

## 2020-05-04 NOTE — Progress Notes (Signed)
PROGRESS NOTE    [name]   SWH:[id number]  DOB: 02/12/1931  DOA: 04/29/2020     5  PCP: Darreld Mclean, MD  CC: Witnessed syncopal fall at [facility] Hospital Course: [name] is an 86 yo CF with PMH hypothyroidism, hypertension, hyperlipidemia, glaucoma, osteoarthritis, breast cancer status post lumpectomy and hormone therapy, cervical cancer, Barrett's esophagus, dysphagia who presented to the hospital after a [place]opal fall at church. She was brought to the hospital on 04/29/2020. She had no presyncopal symptoms including chest pain, dizziness, nor shortness of breath. She did hit her head during the fall. She typically is active and walks daily with her husband several miles. She had been having increased amount of falls the past few months prior to hospitalization. She was started on verapamil after a hospitalization in May 2020 for hypertensive crisis. In the ER on work-up she was found to be bradycardic with slightly increased blood pressure. In the ER she underwent work-up with CT head which revealed acute intracranial hemorrhage with bilateral frontal subarachnoid hemorrhage and small amount of right occipital subarachnoid hemorrhage along with a possible focus of intraparenchymal hemorrhage in the left frontal subcortical gray matter. It also showed left frontotemporal and periorbital preseptal scalp hematoma as well as nondisplaced superolateral orbital fracture.  Etiology of her syncope was considered possibly due to medication, diltiazem. She was taken off of this and transitioned to amlodipine. Her bradycardia improved with further monitoring on telemetry during hospitalization. She was evaluated by neurosurgery and trauma. She underwent repeat CT scan of her head as needed. There was no neurologic surgical intervention needed. She was also evaluated by ENT for her orbital fracture and also did not require any surgical intervention. She was treated supportively during  hospitalization. She was evaluated by physical therapy and Occupational Therapy with recommendations for [facility] inpatient rehab at time of discharge.   Interval History:  Patient resting in bed this morning. Noting ongoing headache and soreness in her face as expected. Also complaining of some middle lower back pain, heating pad does help some when previously used. Otherwise she is pleasantly resting in bed in no distress and awaiting rehab placement she understands.  Old records reviewed in assessment of this patient  ROS: Pertinent items noted in HPI and remainder of comprehensive ROS otherwise negative.  Assessment & Plan: Syncope and collapse -Consider due to medication effect. Verapamil has been discontinued -Bradycardia has improved. Continue monitoring on telemetry until rehab bed available -Possible history and contribution of A. fib. Patient will have a Holter monitor at time of discharge. Case discussed with cardiology  Bradycardia -No history of A. fib, stroke. -There is an inconsistent documentation in the chart about patient's possibly having a short lasting A. fib while in the ED.  Patient's daughter is raising the chance of A. fib as a cause of her syncope.  On 7/21, I d/w cardiology team. Patient is planned for a holter monitoring as an outpatient. -Patient was on verapamil at home for hypertension. Because of sinus bradycardia, verapamil is currently on hold. -Heart rate has improved while holding verapamil. Patient will remain off of this at time of discharge  TBI (traumatic brain injury) ([place]) Acute intracranial hemorrhage with bilateral frontal subarachnoid hemorrhage and small amount of right occipital subarachnoid hemorrhage. - evaluated by trauma surgery and ENT - repeat CTH stable; no surgical interventions required - patient medically stable from surgery standpoint to rehab when bed available   SAH (subarachnoid hemorrhage) ([place]) -See TBI  Orbital fracture,  closed, initial encounter [facility]) ENT consultation obtained.  No need of surgical intervention. -EOMI. Continue supportive care and pain control -Outpatient follow-up with ophthalmology if develops vision changes; none during hospitalization  Hyperlipidemia -Continue Crestor  Back pain -Osteoarthritis appreciated in C-spine. Likely extends further. Pain also contributed by muscle spasms as well as prolonged hospitalization being bedbound -Continue Robaxin -Continue scheduled Tylenol -Add heating pad as needed  Essential hypertension -Continue amlodipine. Verapamil has been discontinued   Antimicrobials: N/A  DVT prophylaxis: H SQ Code Status: Full Family Communication: None present Disposition Plan:  Status is: Inpatient  Remains inpatient appropriate because:Ongoing active pain requiring inpatient pain management and Unsafe d/c plan   Dispo: The patient is from: Home              Anticipated d/c is to: CIR              Anticipated d/c date is: 1 day              Patient currently is not medically stable to d/c.  Objective: Blood pressure (!) 144/91, pulse 76, temperature 97.7 F (36.5 C), temperature source Oral, resp. rate 18, height 5\' 2"  (1.575 m), weight 51.2 kg, SpO2 99 %.  Examination: General appearance: Pleasant elderly woman resting in bed in no distress with facial bruising appreciated Head: Bruising appreciated on bilateral face Eyes: Erythematous conjunctiva on right eye. EOMI Lungs: clear to auscultation bilaterally Heart: regular rate and rhythm and S1, S2 normal Abdomen: normal findings: bowel sounds normal and soft, non-tender Extremities: No edema Skin: mobility and turgor normal Neurologic: Grossly normal   Consultants:   Trauma  ENT  Procedures:   None  Data Reviewed: I have personally reviewed following labs and imaging studies No results found for this or any previous visit (from the past 24 hour(s)).  Recent Results (from the past 240  hour(s))  SARS Coronavirus 2 by RT PCR (hospital order, performed in [facility] hospital lab) Nasopharyngeal Nasopharyngeal Swab     Status: None   Collection Time: 04/29/20  2:22 PM   Specimen: Nasopharyngeal Swab  Result Value Ref Range Status   SARS Coronavirus 2 NEGATIVE NEGATIVE Final    Comment: (NOTE) SARS-CoV-2 target nucleic acids are NOT DETECTED.  The SARS-CoV-2 RNA is generally detectable in upper and lower respiratory specimens during the acute phase of infection. The lowest concentration of SARS-CoV-2 viral copies this assay can detect is 250 copies / mL. A negative result does not preclude SARS-CoV-2 infection and should not be used as the sole basis for treatment or other patient management decisions.  A negative result may occur with improper specimen collection / handling, submission of specimen other than nasopharyngeal swab, presence of viral mutation(s) within the areas targeted by this assay, and inadequate number of viral copies (<250 copies / mL). A negative result must be combined with clinical observations, patient history, and epidemiological information.  Fact Sheet for Patients:   StrictlyIdeas.no  Fact Sheet for Healthcare Providers: BankingDealers.co.za  This test is not yet approved or  cleared by the [place] FDA and has been authorized for detection and/or diagnosis of SARS-CoV-2 by FDA under an Emergency Use Authorization (EUA).  This EUA will remain in effect (meaning this test can be used) for the duration of the COVID-19 declaration under Section 564(b)(1) of the Act, 21 U.S.C. section 360bbb-3(b)(1), unless the authorization is terminated or revoked sooner.  Performed at [facility], [place] [address], [place], [place]  [address]   MRSA PCR Screening     Status: None   Collection Time: 04/29/20  3:31 PM   Specimen: Nasal Mucosa; Nasopharyngeal  Result Value Ref Range Status   MRSA  by PCR NEGATIVE NEGATIVE Final    Comment:        The GeneXpert MRSA Assay (FDA approved for NASAL specimens only), is one component of a comprehensive MRSA colonization surveillance program. It is not intended to diagnose MRSA infection nor to guide or monitor treatment for MRSA infections. Performed at [facility], [place] [address], [place], [place] [address]      Radiology Studies: VAS US CAROTID  Result Date: 05/02/2020 Carotid Arterial Duplex Study Indications:       Syncope. Risk Factors:      Hypertension, hyperlipidemia. Comparison Study:  no prior Performing Technologist: Abram Sander RVS  Examination Guidelines: A complete evaluation includes B-mode imaging, spectral Doppler, color Doppler, and power Doppler as needed of all accessible portions of each vessel. Bilateral testing is considered an integral part of a complete examination. Limited examinations for reoccurring indications may be performed as noted.  Right Carotid Findings: +----------+--------+--------+--------+------------------+--------+           PSV cm/sEDV cm/sStenosisPlaque DescriptionComments +----------+--------+--------+--------+------------------+--------+ CCA Prox  86      16              heterogenous               +----------+--------+--------+--------+------------------+--------+ CCA Distal63      12              heterogenous               +----------+--------+--------+--------+------------------+--------+ ICA Prox  76      22      1-39%   heterogenous               +----------+--------+--------+--------+------------------+--------+ ICA Distal76      22                                         +----------+--------+--------+--------+------------------+--------+ ECA       76      7                                          +----------+--------+--------+--------+------------------+--------+ +----------+--------+-------+--------+-------------------+           PSV cm/sEDV  cmsDescribeArm Pressure (mmHG) +----------+--------+-------+--------+-------------------+ [id number]                                         +----------+--------+-------+--------+-------------------+ +---------+--------+--+--------+--+---------+ VertebralPSV cm/s44EDV cm/s10Antegrade +---------+--------+--+--------+--+---------+  Left Carotid Findings: +----------+--------+--------+--------+------------------+--------+           PSV cm/sEDV cm/sStenosisPlaque DescriptionComments +----------+--------+--------+--------+------------------+--------+ CCA Prox  72      19              heterogenous               +----------+--------+--------+--------+------------------+--------+ CCA Distal69      18              heterogenous               +----------+--------+--------+--------+------------------+--------+ ICA Prox  76      25  1-39%   heterogenous               +----------+--------+--------+--------+------------------+--------+ ICA Distal55      14                                         +----------+--------+--------+--------+------------------+--------+ ECA       69      13                                         +----------+--------+--------+--------+------------------+--------+ +----------+--------+--------+--------+-------------------+           PSV cm/sEDV cm/sDescribeArm Pressure (mmHG) +----------+--------+--------+--------+-------------------+ [id number]                                          +----------+--------+--------+--------+-------------------+ +---------+--------+--+--------+-+---------+ VertebralPSV cm/s35EDV cm/s7Antegrade +---------+--------+--+--------+-+---------+   Summary: Right Carotid: Velocities in the right ICA are consistent with a 1-39% stenosis. Left Carotid: Velocities in the left ICA are consistent with a 1-39% stenosis. Vertebrals: Bilateral vertebral arteries demonstrate antegrade flow. *See table(s)  above for measurements and observations.  Electronically signed by Ruta Hinds MD on 05/02/2020 at 6:13:43 PM.    Final    VAS US CAROTID  Final Result    CT HEAD WO CONTRAST  Final Result    CT Cervical Spine Wo Contrast  Final Result    CT Maxillofacial Wo Contrast  Final Result    CT Head Wo Contrast  Final Result       Scheduled Meds: . acetaminophen  1,000 mg Oral Q6H  . amLODipine  2.5 mg Oral Daily  . Chlorhexidine Gluconate Cloth  6 each Topical Daily  . docusate sodium  100 mg Oral BID  . feeding supplement (ENSURE ENLIVE)  237 mL Oral BID BM  . heparin  5,000 Units Subcutaneous Q8H  . latanoprost  1 drop Both Eyes QHS  . levETIRAcetam  500 mg Oral BID  . levothyroxine  50 mcg Oral Q0600  . methocarbamol  500 mg Oral Q6H  . multivitamin with minerals  1 tablet Oral Daily  . [START ON 05/05/2020] polyethylene glycol  17 g Oral Daily  . rosuvastatin  10 mg Oral Daily   PRN Meds: bisacodyl, hydrALAZINE, ondansetron **OR** ondansetron (ZOFRAN) IV, oxyCODONE Continuous Infusions:    LOS: 5 days  Time spent: Greater than 50% of the 35 minute visit was spent in counseling/coordination of care for the patient as laid out in the A&P.   Dwyane Dee, MD [facility] 05/04/2020, 9:41 AM   Contact via secure chat.  To contact the attending provider between 7A-7P or the covering provider during after hours 7P-7A, please log into the web site [email or web address] and access using universal [place] password for that web site. If you do not have the password, please call the hospital operator.

## 2020-05-04 NOTE — Assessment & Plan Note (Signed)
-  Continue amlodipine. Verapamil has been discontinued

## 2020-05-04 NOTE — Assessment & Plan Note (Signed)
Continue Crestor 

## 2020-05-04 NOTE — Assessment & Plan Note (Addendum)
-  Consider due to medication effect. Verapamil has been discontinued -Bradycardia has improved. Continue monitoring on telemetry until rehab bed available -Possible history and contribution of A. fib. Patient will have a Holter monitor at time of discharge. Case discussed with cardiology

## 2020-05-04 NOTE — Progress Notes (Signed)
Physical Therapy Treatment Patient Details Name: [name] MRN: [id number] DOB: 24-May-1931 Today's Date: 05/04/2020    History of Present Illness 86 y.o. female with medical history significant of L breast cancer s/p chemo/rads in 2010/2011; HTN; hypothyroidism; and HLD presenting after an unwitnessed fall at church with resultant left, superolateral orbital fracture- non-op management; periorbital ecchymosis and swelling - supportive care; bilateral subarachnoid hemorrhage, intraparenchymal hemorrhage -follow up CT and Keppra BID x7d for seizure prophylaxis; TRH consulted for syncopal w/u, negative thus far; left forehead/cheek laceration. She has a prior h/o delusions and paranoid thinking in early 2020.  She was subsequently hospitalized for elevated BP (possibly related to anxiety) and had an unremarkable MRI.  More recently, she reported difficulty with transposition of numbers and possible hemianopsia of the left eye with ?neglect but without vision loss (20/20 vision since cataract surgery).  She may have mild baseline cognitive impairment but also has unawareness of her surroundings at times - gets lost while driving, falls frequently while not paying attention.    PT Comments    Patient with limited tolerance to activity today with significant back pain and nausea limiting mobility and eventually need to return to supine.  She was trying to push through and participate and felt if arthritic pain was the cause ambulation would improve symptoms, but she continued with complaints and increased after activity.  MD made aware and requested pain medication from RN.  PT to continue to follow acutely.  Continue CIR recommendations.    Follow Up Recommendations  CIR     Equipment Recommendations  Rolling walker with 5" wheels    Recommendations for Other Services       Precautions / Restrictions Precautions Precautions: Fall    Mobility  Bed Mobility Overal bed mobility: Needs  Assistance Bed Mobility: Rolling;Sidelying to Sit;Sit to Supine Rolling: Min assist Sidelying to sit: Min assist   Sit to supine: Mod assist   General bed mobility comments: assist for technique due to back pain, assist for lifting trunk to sit and for feet into bed to supine  Transfers Overall transfer level: Needs assistance Equipment used: 1 person hand held assist;Rolling walker (2 wheeled) Transfers: Sit to/from Stand Sit to Stand: Min assist Stand pivot transfers: Min assist       General transfer comment: up from EOB with HHA to walk to bathroom, then to RW from toilet and ambulating to hallway then to recliner, stand step to bed with RW  Ambulation/Gait Ambulation/Gait assistance: Min assist Gait Distance (Feet): 150 Feet Assistive device: Rolling walker (2 wheeled) Gait Pattern/deviations: Step-through pattern;Decreased stride length;Shuffle;Antalgic     General Gait Details: pain in back limiting tolerance requiring RW for support due to pain in back   Stairs             Wheelchair Mobility    Modified Rankin (Stroke Patients Only)       Balance Overall balance assessment: Needs assistance   Sitting balance-Leahy Scale: Good     Standing balance support: Single extremity supported;Bilateral upper extremity supported;During functional activity Standing balance-Leahy Scale: Poor Standing balance comment: washing hands at sink needing UE support due to pain                            Cognition Arousal/Alertness: Awake/alert Behavior During Therapy: WFL for tasks assessed/performed Overall Cognitive Status: Impaired/Different from baseline Area of Impairment: Memory;Safety/judgement  Current Attention Level: Selective Memory: Decreased short-term memory   Safety/Judgement: Decreased awareness of safety     General Comments: tearful feeling poorly today back and head pain and nausea; repeated complaints  frequently throughout with decreased STM      Exercises      General Comments General comments (skin integrity, edema, etc.): Family in the room supporting pt reports she has never had pain from arthritis like this before, MD made aware of pain      Pertinent Vitals/Pain Faces Pain Scale: Hurts whole lot Pain Location: back Pain Descriptors / Indicators: Aching;Spasm;Grimacing;Guarding Pain Intervention(s): Monitored during session;Repositioned;Patient requesting pain meds-RN notified;Limited activity within patient's tolerance    Home Living                      Prior Function            PT Goals (current goals can now be found in the care plan section) Progress towards PT goals: Not progressing toward goals - comment (due to pain in back)    Frequency    Min 4X/week      PT Plan Current plan remains appropriate    Co-evaluation              AM-PAC PT "6 Clicks" Mobility   Outcome Measure  Help needed turning from your back to your side while in a flat bed without using bedrails?: A Little Help needed moving from lying on your back to sitting on the side of a flat bed without using bedrails?: A Little Help needed moving to and from a bed to a chair (including a wheelchair)?: A Little Help needed standing up from a chair using your arms (e.g., wheelchair or bedside chair)?: A Little Help needed to walk in hospital room?: A Little Help needed climbing 3-5 steps with a railing? : A Little 6 Click Score: 18    End of Session Equipment Utilized During Treatment: Gait belt Activity Tolerance: Patient limited by pain Patient left: in bed;with call bell/phone within reach   PT Visit Diagnosis: Unsteadiness on feet (R26.81);Muscle weakness (generalized) (M62.81);History of falling (Z91.81)     Time: 3149-7026 PT Time Calculation (min) (ACUTE ONLY): 35 min  Charges:  $Gait Training: 8-22 mins $Therapeutic Activity: 8-22 mins                      [name], PT Acute Rehabilitation Services Pager:[phone] Office:[phone] 05/04/2020    [name] 05/04/2020, 11:29 AM

## 2020-05-04 NOTE — Assessment & Plan Note (Signed)
Acute intracranial hemorrhage with bilateral frontal subarachnoid hemorrhage and small amount of right occipital subarachnoid hemorrhage. - evaluated by trauma surgery and ENT - repeat CTH stable; no surgical interventions required - patient medically stable from surgery standpoint to rehab when bed available

## 2020-05-04 NOTE — Assessment & Plan Note (Addendum)
-  Osteoarthritis appreciated in C-spine. Likely extends further. Pain also contributed by muscle spasms as well as prolonged hospitalization being bedbound -Continue Robaxin -Continue scheduled Tylenol -Add heating pad as needed

## 2020-05-04 NOTE — Hospital Course (Signed)
Ms. Rosman is an 86 yo CF with PMH hypothyroidism, hypertension, hyperlipidemia, glaucoma, osteoarthritis, breast cancer status post lumpectomy and hormone therapy, cervical cancer, Barrett's esophagus, dysphagia who presented to the hospital after a [place]opal [place]. She was brought to the hospital on 04/29/2020. She had no presyncopal symptoms including chest pain, dizziness, nor shortness of breath. She did hit her head during the fall. She typically is active and walks daily with her husband several miles. She had been having increased amount of falls the past few months prior to hospitalization. She was started on verapamil after a hospitalization in May 2020 for hypertensive crisis. In the ER on work-up she was found to be bradycardic with slightly increased blood pressure. In the ER she underwent work-up with CT head which revealed acute intracranial hemorrhage with bilateral frontal subarachnoid hemorrhage and small amount of right occipital subarachnoid hemorrhage along with a possible focus of intraparenchymal hemorrhage in the left frontal subcortical gray matter. It also showed left frontotemporal and periorbital preseptal scalp hematoma as well as nondisplaced superolateral orbital fracture.  Etiology of her syncope was considered possibly due to medication, diltiazem. She was taken off of this and transitioned to amlodipine. Her bradycardia improved with further monitoring on telemetry during hospitalization. She was evaluated by neurosurgery and trauma. She underwent repeat CT scan of her head as needed. There was no neurologic surgical intervention needed. She was also evaluated by ENT for her orbital fracture and also did not require any surgical intervention. She was treated supportively during hospitalization. She was evaluated by physical therapy and Occupational Therapy with recommendations for [facility] inpatient rehab at time of discharge.

## 2020-05-04 NOTE — Progress Notes (Signed)
[facility] Progress Note     Subjective: CC-  Comfortable this morning. Slept well last night. Denies headache or dizziness this AM. Denies visual complaints. Tolerating diet. No BM. Denies abdominal pain, nausea, vomiting.  Objective: Vital signs in last 24 hours: Temp:  [97.7 F (36.5 C)-98.6 F (37 C)] 97.7 F (36.5 C) (07/23 0807) Pulse Rate:  [61-93] 76 (07/23 0807) Resp:  [16-26] 18 (07/23 0807) BP: (106-154)/(64-130) 144/91 (07/23 0807) SpO2:  [98 %-100 %] 99 % (07/23 0807) Last BM Date: 04/30/20  Intake/Output from previous day: No intake/output data recorded. Intake/Output this shift: No intake/output data recorded.  PE: Gen: Alert, NAD, pleasant HEENT:Bilateral periorbital ecchymosis and swelling improving.EOM's intactwithout entrapment. PERRL. Left forehead and cheek laceration with sutures in place, c/d/i and no erythema or drainage Card: RRR Pulm: CTAB, no W/R/R, rate and effort normal Abd: Soft, NT/ND, +BS XKG:YJEHUD soft and nontender without edema Psych: A&Ox4 Neuro: Non-focal. CN 3-12 grossly intact. Moves all extremities. Skin: no rashes noted, warm and dry   Lab Results:  Recent Labs    05/02/20 0403 05/03/20 0426  WBC 7.3 7.7  HGB 9.6* 10.9*  HCT 30.6* 33.4*  PLT 154 186   BMET Recent Labs    05/02/20 0403 05/03/20 0426  NA 139 137  K 4.2 4.8  CL 109 106  CO2 23 21*  GLUCOSE 91 99  BUN 12 13  CREATININE 1.03* 0.91  CALCIUM 8.4* 8.5*   PT/INR No results for input(s): LABPROT, INR in the last 72 hours. CMP     Component Value Date/Time   NA 137 05/03/2020 0426   NA 140 03/18/2013 0900   K 4.8 05/03/2020 0426   K 4.4 03/18/2013 0900   CL 106 05/03/2020 0426   CL 105 03/18/2013 0900   CO2 21 (L) 05/03/2020 0426   CO2 25 03/18/2013 0900   GLUCOSE 99 05/03/2020 0426   GLUCOSE 95 03/18/2013 0900   BUN 13 05/03/2020 0426   BUN 20.2 03/18/2013 0900   CREATININE 0.91 05/03/2020 0426   CREATININE 1.10 (H)  08/20/2017 1544   CREATININE 1.0 03/18/2013 0900   CALCIUM 8.5 (L) 05/03/2020 0426   CALCIUM 9.2 02/23/2019 0746   CALCIUM 9.6 03/18/2013 0900   PROT 5.8 (L) 04/30/2020 0416   PROT 6.7 03/18/2013 0900   ALBUMIN 3.0 (L) 04/30/2020 0416   ALBUMIN 3.3 (L) 03/18/2013 0900   AST 31 04/30/2020 0416   AST 21 03/18/2013 0900   ALT 23 04/30/2020 0416   ALT 12 03/18/2013 0900   ALKPHOS 72 04/30/2020 0416   ALKPHOS 80 03/18/2013 0900   BILITOT 0.8 04/30/2020 0416   BILITOT 0.36 03/18/2013 0900   GFRNONAA 57 (L) 05/03/2020 0426   GFRNONAA 51 (L) 02/28/2016 0853   GFRAA >60 05/03/2020 0426   GFRAA 58 (L) 02/28/2016 0853   Lipase     Component Value Date/Time   LIPASE 27 09/08/2017 1126       Studies/Results: VAS US CAROTID  Result Date: 05/02/2020 Carotid Arterial Duplex Study Indications:       Syncope. Risk Factors:      Hypertension, hyperlipidemia. Comparison Study:  no prior Performing Technologist: Abram Sander RVS  Examination Guidelines: A complete evaluation includes B-mode imaging, spectral Doppler, color Doppler, and power Doppler as needed of all accessible portions of each vessel. Bilateral testing is considered an integral part of a complete examination. Limited examinations for reoccurring indications may be performed as noted.  Right Carotid Findings: +----------+--------+--------+--------+------------------+--------+  PSV cm/sEDV cm/sStenosisPlaque DescriptionComments +----------+--------+--------+--------+------------------+--------+ CCA Prox  86      16              heterogenous               +----------+--------+--------+--------+------------------+--------+ CCA Distal63      12              heterogenous               +----------+--------+--------+--------+------------------+--------+ ICA Prox  76      22      1-39%   heterogenous               +----------+--------+--------+--------+------------------+--------+ ICA Distal76      22                                          +----------+--------+--------+--------+------------------+--------+ ECA       76      7                                          +----------+--------+--------+--------+------------------+--------+ +----------+--------+-------+--------+-------------------+           PSV cm/sEDV cmsDescribeArm Pressure (mmHG) +----------+--------+-------+--------+-------------------+ [id number]                                         +----------+--------+-------+--------+-------------------+ +---------+--------+--+--------+--+---------+ VertebralPSV cm/s44EDV cm/s10Antegrade +---------+--------+--+--------+--+---------+  Left Carotid Findings: +----------+--------+--------+--------+------------------+--------+           PSV cm/sEDV cm/sStenosisPlaque DescriptionComments +----------+--------+--------+--------+------------------+--------+ CCA Prox  72      19              heterogenous               +----------+--------+--------+--------+------------------+--------+ CCA Distal69      18              heterogenous               +----------+--------+--------+--------+------------------+--------+ ICA Prox  76      25      1-39%   heterogenous               +----------+--------+--------+--------+------------------+--------+ ICA Distal55      14                                         +----------+--------+--------+--------+------------------+--------+ ECA       69      13                                         +----------+--------+--------+--------+------------------+--------+ +----------+--------+--------+--------+-------------------+           PSV cm/sEDV cm/sDescribeArm Pressure (mmHG) +----------+--------+--------+--------+-------------------+ [id number]                                          +----------+--------+--------+--------+-------------------+ +---------+--------+--+--------+-+---------+ VertebralPSV  cm/s35EDV cm/s7Antegrade +---------+--------+--+--------+-+---------+   Summary: Right Carotid: Velocities in the right ICA are consistent with a 1-39%  stenosis. Left Carotid: Velocities in the left ICA are consistent with a 1-39% stenosis. Vertebrals: Bilateral vertebral arteries demonstrate antegrade flow. *See table(s) above for measurements and observations.  Electronically signed by Ruta Hinds MD on 05/02/2020 at 6:13:43 PM.    Final     Anti-infectives: Anti-infectives (From admission, onward)   None       Assessment/Plan Status post fall, unwitnessed  Left, superolateral orbital fracture-ENT c/s,Dr. Redmond Baseman, non-op management, noentrapment, no visual symptoms. Discussed with Ophthalmology per family request - with no change in visual symptoms there is no indication for acute ophtho eval, if visual symptoms develop call them back, otherwise can follow up OP if needed (Dr. Talbert Forest) Periorbital ecchymosis and swelling-supportive care Bilateral subarachnoid hemorrhage, intraparenchymal hemorrhage- Per Neurosurgery, Dr. Ronnald Ramp. Follow up [place] stable 7/19.Keppra BIDx7dfor seizure prophylaxis.TBI therapies. History of falls- Blood sugar normal on presentation. On cardiac monitoring. EKG on admission NSR. Labs overall unremarkable. No urinary symptoms.UAnegative.TRH consulted forsyncopal w/u, negative thus far. EEG 7/19 negative, TSH normal, echo normal.Carotid duplexdoes not show any significant stenosis, most recent in 2018 with plaque at b/l bifurcations.Recommending delayed MRI of the brain when SAH/IPH stabilized. Recommendsneuropsychiatricas outpatient. Planning OP Holter monitor Left forehead/cheek laceration- s/p repair in ED by EDP(absorbable sutures) History of breast cancer CKD- Baseline ~1.06. Cr 0.91 (7/22). monitor Hx HTN- home meds on hold, TRH started norvasc 2.5mg  on 7/22. PRNHydralazine Hx HLD- Continue home meds HxHypothyroidism- TSH wnl.  Continue home meds  FEN -HH, Ensure, SLIVF VTE -SCDs, sq heparin Foley - none  Dispo - Labs pending. Continue TBI therapies. CIR following, medically stable for discharge when bed available.    LOS: 5 days    [facility] 05/04/2020, 9:02 AM Please see Amion for pager number during day hours 7:00am-4:30pm

## 2020-05-04 NOTE — Assessment & Plan Note (Signed)
-  No history of A. fib, stroke. -There is an inconsistent documentation in the chart about patient's possibly having a short lasting A. fib while in the ED.  Patient's daughter is raising the chance of A. fib as a cause of her syncope.  On 7/21, I d/w cardiology team. Patient is planned for a holter monitoring as an outpatient. -Patient was on verapamil at home for hypertension. Because of sinus bradycardia, verapamil is currently on hold. -Heart rate has improved while holding verapamil. Patient will remain off of this at time of discharge

## 2020-05-04 NOTE — Progress Notes (Signed)
Inpatient Rehab Admissions Coordinator:   I have a bed for this pt to admit to CIR on Saturday (7/24).  Trauma team in agreement.  Rehab MD (Dr. Posey Pronto) to assess pt and confirm admission on Saturday.  Floor RN can call CIR at [phone] for report after 12pm on Saturday.  I have let pt/family and case manager know.    Shann Medal, PT, DPT Admissions Coordinator [phone] 05/04/20  12:37 PM

## 2020-05-04 NOTE — Assessment & Plan Note (Signed)
-  See TBI

## 2020-05-04 NOTE — Assessment & Plan Note (Addendum)
ENT consultation obtained.  No need of surgical intervention. -EOMI. Continue supportive care and pain control -Outpatient follow-up with ophthalmology if develops vision changes; none during hospitalization

## 2020-05-05 ENCOUNTER — Inpatient Hospital Stay (HOSPITAL_COMMUNITY)
Admission: RE | Admit: 2020-05-05 | Discharge: 2020-05-14 | DRG: 945 | Disposition: A | Discharge status: Home / Self Care | Payer: Medicare Other | Source: Intra-hospital | Attending: Physical Medicine & Rehabilitation | Admitting: Physical Medicine & Rehabilitation

## 2020-05-05 ENCOUNTER — Encounter (HOSPITAL_COMMUNITY): Payer: Self-pay | Admitting: Physical Medicine & Rehabilitation

## 2020-05-05 ENCOUNTER — Other Ambulatory Visit: Payer: Self-pay

## 2020-05-05 DIAGNOSIS — I609 Nontraumatic subarachnoid hemorrhage, unspecified: Secondary | ICD-10-CM

## 2020-05-05 DIAGNOSIS — Z79890 Hormone replacement therapy (postmenopausal): Secondary | ICD-10-CM | POA: Diagnosis not present

## 2020-05-05 DIAGNOSIS — M810 Age-related osteoporosis without current pathological fracture: Secondary | ICD-10-CM | POA: Diagnosis present

## 2020-05-05 DIAGNOSIS — K5900 Constipation, unspecified: Secondary | ICD-10-CM | POA: Diagnosis present

## 2020-05-05 DIAGNOSIS — Z923 Personal history of irradiation: Secondary | ICD-10-CM | POA: Diagnosis not present

## 2020-05-05 DIAGNOSIS — Z79899 Other long term (current) drug therapy: Secondary | ICD-10-CM

## 2020-05-05 DIAGNOSIS — E782 Mixed hyperlipidemia: Secondary | ICD-10-CM

## 2020-05-05 DIAGNOSIS — E785 Hyperlipidemia, unspecified: Secondary | ICD-10-CM | POA: Diagnosis not present

## 2020-05-05 DIAGNOSIS — E039 Hypothyroidism, unspecified: Secondary | ICD-10-CM | POA: Diagnosis present

## 2020-05-05 DIAGNOSIS — G319 Degenerative disease of nervous system, unspecified: Secondary | ICD-10-CM | POA: Diagnosis not present

## 2020-05-05 DIAGNOSIS — D62 Acute posthemorrhagic anemia: Secondary | ICD-10-CM | POA: Diagnosis not present

## 2020-05-05 DIAGNOSIS — Z9221 Personal history of antineoplastic chemotherapy: Secondary | ICD-10-CM | POA: Diagnosis not present

## 2020-05-05 DIAGNOSIS — S066X9D Traumatic subarachnoid hemorrhage with loss of consciousness of unspecified duration, subsequent encounter: Secondary | ICD-10-CM | POA: Diagnosis not present

## 2020-05-05 DIAGNOSIS — Z853 Personal history of malignant neoplasm of breast: Secondary | ICD-10-CM | POA: Diagnosis not present

## 2020-05-05 DIAGNOSIS — I129 Hypertensive chronic kidney disease with stage 1 through stage 4 chronic kidney disease, or unspecified chronic kidney disease: Secondary | ICD-10-CM | POA: Diagnosis not present

## 2020-05-05 DIAGNOSIS — E871 Hypo-osmolality and hyponatremia: Secondary | ICD-10-CM | POA: Diagnosis present

## 2020-05-05 DIAGNOSIS — F22 Delusional disorders: Secondary | ICD-10-CM

## 2020-05-05 DIAGNOSIS — S069X9A Unspecified intracranial injury with loss of consciousness of unspecified duration, initial encounter: Secondary | ICD-10-CM | POA: Diagnosis not present

## 2020-05-05 DIAGNOSIS — K2270 Barrett's esophagus without dysplasia: Secondary | ICD-10-CM | POA: Diagnosis present

## 2020-05-05 DIAGNOSIS — M25552 Pain in left hip: Secondary | ICD-10-CM | POA: Diagnosis present

## 2020-05-05 DIAGNOSIS — Z8616 Personal history of COVID-19: Secondary | ICD-10-CM

## 2020-05-05 DIAGNOSIS — W19XXXD Unspecified fall, subsequent encounter: Secondary | ICD-10-CM | POA: Diagnosis present

## 2020-05-05 DIAGNOSIS — I629 Nontraumatic intracranial hemorrhage, unspecified: Secondary | ICD-10-CM

## 2020-05-05 DIAGNOSIS — S02841D Fracture of lateral orbital wall, right side, subsequent encounter for fracture with routine healing: Secondary | ICD-10-CM

## 2020-05-05 DIAGNOSIS — Z8249 Family history of ischemic heart disease and other diseases of the circulatory system: Secondary | ICD-10-CM

## 2020-05-05 DIAGNOSIS — S069X0A Unspecified intracranial injury without loss of consciousness, initial encounter: Secondary | ICD-10-CM

## 2020-05-05 DIAGNOSIS — S069X9D Unspecified intracranial injury with loss of consciousness of unspecified duration, subsequent encounter: Secondary | ICD-10-CM

## 2020-05-05 DIAGNOSIS — N183 Chronic kidney disease, stage 3 unspecified: Secondary | ICD-10-CM | POA: Diagnosis present

## 2020-05-05 DIAGNOSIS — I10 Essential (primary) hypertension: Secondary | ICD-10-CM

## 2020-05-05 DIAGNOSIS — M549 Dorsalgia, unspecified: Secondary | ICD-10-CM | POA: Diagnosis present

## 2020-05-05 DIAGNOSIS — G450 Vertebro-basilar artery syndrome: Secondary | ICD-10-CM | POA: Diagnosis not present

## 2020-05-05 DIAGNOSIS — I6782 Cerebral ischemia: Secondary | ICD-10-CM | POA: Diagnosis not present

## 2020-05-05 DIAGNOSIS — Z87891 Personal history of nicotine dependence: Secondary | ICD-10-CM | POA: Diagnosis not present

## 2020-05-05 DIAGNOSIS — Z8541 Personal history of malignant neoplasm of cervix uteri: Secondary | ICD-10-CM

## 2020-05-05 DIAGNOSIS — Z961 Presence of intraocular lens: Secondary | ICD-10-CM | POA: Diagnosis not present

## 2020-05-05 DIAGNOSIS — M533 Sacrococcygeal disorders, not elsewhere classified: Secondary | ICD-10-CM | POA: Diagnosis not present

## 2020-05-05 DIAGNOSIS — G9389 Other specified disorders of brain: Secondary | ICD-10-CM | POA: Diagnosis not present

## 2020-05-05 DIAGNOSIS — N1831 Chronic kidney disease, stage 3a: Secondary | ICD-10-CM

## 2020-05-05 DIAGNOSIS — R001 Bradycardia, unspecified: Secondary | ICD-10-CM

## 2020-05-05 DIAGNOSIS — S79912A Unspecified injury of left hip, initial encounter: Secondary | ICD-10-CM | POA: Diagnosis not present

## 2020-05-05 DIAGNOSIS — R55 Syncope and collapse: Secondary | ICD-10-CM

## 2020-05-05 DIAGNOSIS — S069XAA Unspecified intracranial injury with loss of consciousness status unknown, initial encounter: Secondary | ICD-10-CM | POA: Diagnosis present

## 2020-05-05 DIAGNOSIS — R569 Unspecified convulsions: Secondary | ICD-10-CM | POA: Diagnosis not present

## 2020-05-05 DIAGNOSIS — I672 Cerebral atherosclerosis: Secondary | ICD-10-CM | POA: Diagnosis not present

## 2020-05-05 LAB — CBC
HCT: 34.2 % — ABNORMAL LOW (ref 36.0–46.0)
Hemoglobin: 11.1 g/dL — ABNORMAL LOW (ref 12.0–15.0)
MCH: 29.8 pg (ref 26.0–34.0)
MCHC: 32.5 g/dL (ref 30.0–36.0)
MCV: 91.7 fL (ref 80.0–100.0)
Platelets: 253 10*3/uL (ref 150–400)
RBC: 3.73 MIL/uL — ABNORMAL LOW (ref 3.87–5.11)
RDW: 15.2 % (ref 11.5–15.5)
WBC: 9.7 10*3/uL (ref 4.0–10.5)
nRBC: 0 % (ref 0.0–0.2)

## 2020-05-05 LAB — BASIC METABOLIC PANEL
Anion gap: 10 (ref 5–15)
BUN: 14 mg/dL (ref 8–23)
CO2: 24 mmol/L (ref 22–32)
Calcium: 9.1 mg/dL (ref 8.9–10.3)
Chloride: 101 mmol/L (ref 98–111)
Creatinine, Ser: 0.92 mg/dL (ref 0.44–1.00)
GFR calc Af Amer: >60 mL/min (ref >60)
GFR calc non Af Amer: 56 mL/min — ABNORMAL LOW (ref >60)
Glucose, Bld: 124 mg/dL — ABNORMAL HIGH (ref 70–99)
Potassium: 4.3 mmol/L (ref 3.5–5.1)
Sodium: 135 mmol/L (ref 135–145)

## 2020-05-05 LAB — CBC WITH DIFFERENTIAL/PLATELET
Abs Immature Granulocytes: 0.05 10*3/uL (ref 0.00–0.07)
Basophils Absolute: 0 10*3/uL (ref 0.0–0.1)
Basophils Relative: 0 %
Eosinophils Absolute: 0.1 10*3/uL (ref 0.0–0.5)
Eosinophils Relative: 1 %
HCT: 34.5 % — ABNORMAL LOW (ref 36.0–46.0)
Hemoglobin: 11.2 g/dL — ABNORMAL LOW (ref 12.0–15.0)
Immature Granulocytes: 1 %
Lymphocytes Relative: 16 %
Lymphs Abs: 1.6 10*3/uL (ref 0.7–4.0)
MCH: 30.3 pg (ref 26.0–34.0)
MCHC: 32.5 g/dL (ref 30.0–36.0)
MCV: 93.2 fL (ref 80.0–100.0)
Monocytes Absolute: 0.8 10*3/uL (ref 0.1–1.0)
Monocytes Relative: 8 %
Neutro Abs: 7.5 10*3/uL (ref 1.7–7.7)
Neutrophils Relative %: 74 %
Platelets: 204 10*3/uL (ref 150–400)
RBC: 3.7 MIL/uL — ABNORMAL LOW (ref 3.87–5.11)
RDW: 15.3 % (ref 11.5–15.5)
WBC: 10.1 10*3/uL (ref 4.0–10.5)
nRBC: 0 % (ref 0.0–0.2)

## 2020-05-05 LAB — MAGNESIUM: Magnesium: 1.7 mg/dL (ref 1.7–2.4)

## 2020-05-05 LAB — CREATININE, SERUM
Creatinine, Ser: 0.84 mg/dL (ref 0.44–1.00)
GFR calc Af Amer: >60 mL/min (ref >60)
GFR calc non Af Amer: >60 mL/min (ref >60)

## 2020-05-05 MED ORDER — SORBITOL 70 % SOLN
30.0000 mL | Freq: Every day | Status: DC | PRN
  Administered 2020-05-11: 30 mL via ORAL
  Filled 2020-05-05 (×2): qty 30

## 2020-05-05 MED ORDER — METHOCARBAMOL 500 MG PO TABS
500.0000 mg | ORAL_TABLET | Freq: Four times a day (QID) | ORAL | Status: DC
  Administered 2020-05-05 – 2020-05-09 (×15): 500 mg via ORAL
  Filled 2020-05-05 (×15): qty 1

## 2020-05-05 MED ORDER — ONDANSETRON 4 MG PO TBDP
4.0000 mg | ORAL_TABLET | Freq: Four times a day (QID) | ORAL | Status: DC | PRN
  Administered 2020-05-08 (×2): 4 mg via ORAL
  Filled 2020-05-05 (×3): qty 1

## 2020-05-05 MED ORDER — LATANOPROST 0.005 % OP SOLN
1.0000 [drp] | Freq: Every day | OPHTHALMIC | Status: DC
  Administered 2020-05-05 – 2020-05-13 (×9): 1 [drp] via OPHTHALMIC
  Filled 2020-05-05: qty 2.5

## 2020-05-05 MED ORDER — BISACODYL 10 MG RE SUPP
10.0000 mg | Freq: Every day | RECTAL | Status: DC | PRN
  Administered 2020-05-06: 10 mg via RECTAL
  Filled 2020-05-05 (×2): qty 1

## 2020-05-05 MED ORDER — DOCUSATE SODIUM 100 MG PO CAPS
100.0000 mg | ORAL_CAPSULE | Freq: Two times a day (BID) | ORAL | Status: DC
  Administered 2020-05-05 – 2020-05-13 (×16): 100 mg via ORAL
  Filled 2020-05-05 (×18): qty 1

## 2020-05-05 MED ORDER — ROSUVASTATIN CALCIUM 5 MG PO TABS
10.0000 mg | ORAL_TABLET | Freq: Every day | ORAL | Status: DC
  Administered 2020-05-06 – 2020-05-14 (×9): 10 mg via ORAL
  Filled 2020-05-05 (×9): qty 2

## 2020-05-05 MED ORDER — ACETAMINOPHEN 325 MG PO TABS
650.0000 mg | ORAL_TABLET | Freq: Four times a day (QID) | ORAL | Status: DC | PRN
  Administered 2020-05-06 – 2020-05-14 (×8): 650 mg via ORAL
  Filled 2020-05-05 (×10): qty 2

## 2020-05-05 MED ORDER — AMLODIPINE BESYLATE 2.5 MG PO TABS
2.5000 mg | ORAL_TABLET | Freq: Every day | ORAL | Status: DC
  Administered 2020-05-06 – 2020-05-14 (×9): 2.5 mg via ORAL
  Filled 2020-05-05 (×9): qty 1

## 2020-05-05 MED ORDER — HEPARIN SODIUM (PORCINE) 5000 UNIT/ML IJ SOLN
5000.0000 [IU] | Freq: Three times a day (TID) | INTRAMUSCULAR | Status: DC
  Administered 2020-05-05 – 2020-05-08 (×9): 5000 [IU] via SUBCUTANEOUS
  Filled 2020-05-05 (×9): qty 1

## 2020-05-05 MED ORDER — LEVETIRACETAM 500 MG PO TABS
500.0000 mg | ORAL_TABLET | Freq: Two times a day (BID) | ORAL | Status: AC
  Administered 2020-05-05 – 2020-05-07 (×5): 500 mg via ORAL
  Filled 2020-05-05: qty 2
  Filled 2020-05-05 (×6): qty 1

## 2020-05-05 MED ORDER — ADULT MULTIVITAMIN W/MINERALS CH
1.0000 | ORAL_TABLET | Freq: Every day | ORAL | Status: DC
  Administered 2020-05-06 – 2020-05-14 (×9): 1 via ORAL
  Filled 2020-05-05 (×9): qty 1

## 2020-05-05 MED ORDER — POLYETHYLENE GLYCOL 3350 17 G PO PACK
17.0000 g | PACK | Freq: Every day | ORAL | Status: DC
  Administered 2020-05-06 – 2020-05-12 (×7): 17 g via ORAL
  Filled 2020-05-05 (×9): qty 1

## 2020-05-05 MED ORDER — OXYCODONE HCL 5 MG PO TABS
2.5000 mg | ORAL_TABLET | ORAL | Status: DC | PRN
  Administered 2020-05-05 – 2020-05-12 (×12): 5 mg via ORAL
  Filled 2020-05-05 (×12): qty 1

## 2020-05-05 MED ORDER — HEPARIN SODIUM (PORCINE) 5000 UNIT/ML IJ SOLN: 5000.0000 [IU] | Freq: Three times a day (TID) | INTRAMUSCULAR | Status: DC

## 2020-05-05 MED ORDER — LEVOTHYROXINE SODIUM 25 MCG PO TABS
50.0000 ug | ORAL_TABLET | Freq: Every day | ORAL | Status: DC
  Administered 2020-05-06 – 2020-05-14 (×9): 50 ug via ORAL
  Filled 2020-05-05 (×9): qty 2

## 2020-05-05 MED ORDER — ONDANSETRON HCL 4 MG/2ML IJ SOLN: 4.0000 mg | Freq: Four times a day (QID) | INTRAMUSCULAR | Status: DC | PRN

## 2020-05-05 MED ORDER — ENSURE ENLIVE PO LIQD
237.0000 mL | Freq: Two times a day (BID) | ORAL | Status: DC
  Administered 2020-05-06 – 2020-05-13 (×4): 237 mL via ORAL

## 2020-05-05 MED ORDER — PROCHLORPERAZINE EDISYLATE 10 MG/2ML IJ SOLN
10.0000 mg | Freq: Once | INTRAMUSCULAR | Status: AC
  Administered 2020-05-05: 10 mg via INTRAVENOUS
  Filled 2020-05-05: qty 2

## 2020-05-05 NOTE — Progress Notes (Signed)
Patient ID: [name], female   DOB: Mar 01, 1931, 86 y.o.   MRN: [id number]     Subjective: Ate well, ready for CIR ROS negative except as listed above. Objective: Vital signs in last 24 hours: Temp:  [97.6 F (36.4 C)-98.8 F (37.1 C)] 97.6 F (36.4 C) (07/24 0730) Pulse Rate:  [59-77] 59 (07/24 0730) Resp:  [14-24] 17 (07/24 0730) BP: (127-151)/(67-87) 142/86 (07/24 0730) SpO2:  [95 %-99 %] 97 % (07/24 0730) Last BM Date: 04/30/20  Intake/Output from previous day: 07/23 0701 - 07/24 0700 In: 500 [P.O.:500] Out: -  Intake/Output this shift: No intake/output data recorded.  General appearance: cooperative Head: B periorbital ecchymoses evolving Resp: clear to auscultation bilaterally Cardio: S1, S2 normal GI: soft, non-tender; bowel sounds normal; no masses,  no organomegaly Extremities: mild edema  Lab Results: CBC  Recent Labs    05/04/20 0933 05/05/20 0552  WBC 9.0 10.1  HGB 10.7* 11.2*  HCT 33.3* 34.5*  PLT 208 204   BMET Recent Labs    05/04/20 0933 05/05/20 0552  NA 137 135  K 4.0 4.3  CL 103 101  CO2 25 24  GLUCOSE 126* 124*  BUN 12 14  CREATININE 0.95 0.92  CALCIUM 9.0 9.1   PT/INR No results for input(s): LABPROT, INR in the last 72 hours. ABG No results for input(s): PHART, HCO3 in the last 72 hours.  Invalid input(s): PCO2, PO2  Studies/Results: DG Lumbar Spine 2-3 Views  Result Date: 05/04/2020 CLINICAL DATA:  Status post fall. EXAM: LUMBAR SPINE - 2-3 VIEW COMPARISON:  May 17, 2013 FINDINGS: There is no evidence of an acute lumbar spine fracture. A chronic compression fracture deformity is seen involving the L1 vertebral body. Mild-to-moderate severity multilevel endplate sclerosis is seen. There is stable, approximately 2 mm retrolisthesis of the L3 vertebral body on L4. Mild multilevel intervertebral disc space narrowing is seen. This is most prominent along the posterior aspect of the L3-L4 level. Moderate severity calcification of  the abdominal aorta is noted. IMPRESSION: 1. Stable chronic compression fracture deformity of the L1 vertebral body. 2. Stable minimal retrolisthesis of L3 on L4. 3. Mild to moderate multilevel degenerative disc disease. Electronically Signed   By: Virgina Norfolk M.D.   On: 05/04/2020 15:45    Anti-infectives: Anti-infectives (From admission, onward)   None      Assessment/Plan: Status post fall, unwitnessed  Left, superolateral orbital fracture-ENT c/s,Dr. Redmond Baseman, non-op management, noentrapment, no visual symptoms. Discussed with Ophthalmology per family request - with no change in visual symptoms there is no indication for acute ophtho eval, if visual symptoms develop call them back, otherwise can follow up OP if needed (Dr. Talbert Forest) Periorbital ecchymosis and swelling-supportive care Bilateral subarachnoid hemorrhage, intraparenchymal hemorrhage- Per Neurosurgery, Dr. Ronnald Ramp. Follow up [place] stable 7/19.Keppra BIDx7dfor seizure prophylaxis.TBI therapies. History of falls- Blood sugar normal on presentation. On cardiac monitoring. EKG on admission NSR. Labs overall unremarkable. No urinary symptoms.UAnegative.TRH consulted forsyncopal w/u, negative thus far. EEG 7/19 negative, TSH normal, echo normal.Carotid duplexdoes not show any significant stenosis, most recent in 2018 with plaque at b/l bifurcations.Recommending delayed MRI of the brain when SAH/IPH stabilized. Recommendsneuropsychiatricas outpatient. Planning OP Holter monitor Left forehead/cheek laceration- s/p repair in ED by EDP(absorbable sutures) History of breast cancer CKD- Baseline ~1.06. Cr 0.91 (7/22). monitor Hx HTN- home meds on hold, TRH started norvasc 2.5mg  on 7/22. PRNHydralazine Hx HLD- Continue home meds HxHypothyroidism- TSH wnl. Continue home meds  FEN -HH, Ensure, SLIVF VTE -SCDs, sq heparin  Foley - none  Dispo - to CIR today   LOS: 6 days    Georganna Skeans, MD, MPH,  FACS Trauma & General Surgery Use [email or web address] to contact on call provider  05/05/2020

## 2020-05-05 NOTE — Progress Notes (Signed)
Inpatient Rehabilitation Medication Review by a Pharmacist  A complete drug regimen review was completed for this patient to identify any potential clinically significant medication issues.  Clinically significant medication issues were identified:  no  Check AMION for pharmacist assigned to patient if future medication questions/issues arise during this admission.  Pharmacist comments:   Time spent performing this drug regimen review (minutes):  [place], PharmD PGY1 Pharmacy Resident 05/05/2020 3:05 PM  Please check [email or web address] for unit-specific pharmacy phone numbers.

## 2020-05-05 NOTE — H&P (Signed)
Physical Medicine and Rehabilitation Admission H&P    Chief Complaint  Patient presents with  . Fall  . Altered Mental Status  : HPI: [name] is an 86 year old right-handed female with history of left breast cancer status post chemotherapy and radiation in 2010, Covid January 2021, hypertension, hypothyroidism, Barrett's esophagus, hyperlipidemia.  History taken from chart review, daughter, and patient.  Patient lives with her husband and independent prior to admission and active.  1 level home 5 steps to entry.  She presented on 04/29/2020 after a fall, ?syncopal event while at church with transient loss of consciousness.  She could not recall the events of the fall.  Cranial CT scan showed acute intracranial hemorrhage with bilateral frontal subarachnoid hemorrhage and small amount of right occipital subarachnoid hemorrhage.  Possible focus of intraparenchymal hemorrhage in the left frontal and subcortical gray-white matter.  Left frontotemporal periorbital preseptal scalp hematoma as well as nondisplaced superolateral orbital fracture.  CT cervical spine negative.  Admission chemistries unremarkable except glucose 116 creatinine 1.06, hemoglobin 12.2, WBC 7300, SARS coronavirus negative, urinalysis negative nitrite.  Neurosurgery, Dr. Sherley Bounds recommended conservative care with follow-up cranial CT scan on 05/01/2020.  Maintained on Keppra for seizure prophylaxis.  No surgical intervention needed for left lateral orbital fracture per Dr. Melida Quitter.  EEG negative for seizure.  Echocardiogram with ejection fraction of 65%. No wall motion abnormalities.  Carotid Dopplers with no ICA stenosis.  Follow-up chemistries close monitoring of creatinine improved 0.91 hemoglobin 10.9.  Patient was cleared to begin subcutaneous heparin for DVT prophylaxis.  Tolerating a regular diet.  Therapy evaluations completed and patient was admitted for a comprehensive rehab program.  Please see preadmission  assessment from earlier today as well.  Review of Systems  Constitutional: Negative for chills and fever.  HENT: Negative for hearing loss.   Eyes: Negative for blurred vision and double vision.  Respiratory: Negative for cough and shortness of breath.   Cardiovascular: Negative for chest pain, palpitations and leg swelling.  Gastrointestinal: Positive for constipation. Negative for heartburn, nausea and vomiting.  Genitourinary: Negative for flank pain and hematuria.  Musculoskeletal: Positive for back pain and myalgias.  Skin: Negative for rash.  Neurological: Positive for dizziness. Negative for sensory change, speech change and focal weakness.  Psychiatric/Behavioral: The patient has insomnia.   All other systems reviewed and are negative.  Past Medical History:  Diagnosis Date  . Autoimmune disease ([place])   . Barrett's esophagus   . Bone lesion    sacrum  . Breast cancer ([place]) 2010   cT1 N0 M0; ER+/ PR+/ HER2 neg; s/p lumpectomy 07/2010; started adjuvant hormonal therapy 10/2009  . Cervical cancer ([place])   . Dysphagia   . Fx distal radius NEC-closed   . Glaucoma   . Humerus fracture 03/2012   impacted left humueral neck fracture, treated by Dr. Amedeo Plenty  . Hyperlipidemia   . Hypertension   . Osteoporosis 03/18/2012  . Pneumonia   . Thyroid disease    Past Surgical History:  Procedure Laterality Date  . ABDOMINAL HYSTERECTOMY  1972   cancer          1 tube 1 ovary  . APPENDECTOMY    . BREAST LUMPECTOMY Left 2010  . colon polyp removal    . EYE SURGERY    . HAND SURGERY    . ortho procedures     multiple  . OVARIAN CYST REMOVAL  1978  . [employer]   left  Family History  Problem Relation Age of Onset  . Heart disease Father   . Cancer Sister        lung, kidney  . Cancer Brother        brain  . Cancer Paternal Aunt        breast  . Breast cancer Paternal Aunt   . Cancer Brother        liver, lung  . Osteoporosis Neg Hx    Social History:   reports that she quit smoking about 33 years ago. She has never used smokeless tobacco. She reports current alcohol use. She reports that she does not use drugs. Allergies:  Allergies  Allergen Reactions  . Daypro [Oxaprozin] Swelling    Face and tongue  . Prolia [Denosumab]     unknown   Medications Prior to Admission  Medication Sig Dispense Refill  . Calcium Carbonate-Vitamin D (CALCIUM + D PO) Take 1 capsule by mouth daily.     . citalopram (CELEXA) 10 MG tablet Take 1 tablet (10 mg total) by mouth daily. (Patient not taking: Reported on 04/29/2020) 90 tablet 3  . latanoprost (XALATAN) 0.005 % ophthalmic solution Place 1 drop into both eyes at bedtime.     Marland Kitchen levothyroxine (SYNTHROID) 50 MCG tablet TAKE 1 TABLET BY MOUTH DAILY BEFORE BREAKFAST (Patient taking differently: Take 50 mcg by mouth daily. ) 90 tablet 3  . Multiple Vitamin (MULTIVITAMIN WITH MINERALS) TABS tablet Take 1 tablet by mouth daily.    . rosuvastatin (CRESTOR) 10 MG tablet Take 1 tablet (10 mg total) by mouth daily. TAKE 1 BY MOUTH DAILY 90 tablet 1  . verapamil (CALAN-SR) 120 MG CR tablet Take 1 tablet (120 mg total) by mouth daily. 90 tablet 2    Drug Regimen Review Drug regimen was reviewed and remains appropriate with no significant issues identified  Home: Home Living Family/patient expects to be discharged to:: Private residence Living Arrangements: Spouse/significant other Available Help at Discharge: Family, Available 24 hours/day Type of Home: House Home Access: Stairs to enter [employer] of Steps: 4-5 Entrance Stairs-Rails: Right, Left, Can reach both Home Layout: One level Bathroom Shower/Tub: Tub/shower unit, Multimedia programmer: Handicapped height Bathroom Accessibility: Yes Home Equipment: Shower seat - built in, [employer] - tub/shower   Functional History: Prior Function Level of Independence: Independent Comments: drives; active; enjoys hhiking; has had several  falls  Functional Status:  Mobility: Bed Mobility Overal bed mobility: Needs Assistance Bed Mobility: Rolling, Sidelying to Sit, Sit to Supine Rolling: Min assist Sidelying to sit: Min assist Supine to sit: Min guard Sit to supine: Mod assist General bed mobility comments: assist for technique due to back pain, assist for lifting trunk to sit and for feet into bed to supine Transfers Overall transfer level: Needs assistance Equipment used: 1 person hand held assist, Rolling walker (2 wheeled) Transfers: Sit to/from Stand Sit to Stand: Min assist Stand pivot transfers: Min assist General transfer comment: up from EOB with HHA to walk to bathroom, then to RW from toilet and ambulating to hallway then to recliner, stand step to bed with RW Ambulation/Gait Ambulation/Gait assistance: Min assist Gait Distance (Feet): 150 Feet Assistive device: Rolling walker (2 wheeled) Gait Pattern/deviations: Step-through pattern, Decreased stride length, Shuffle, Antalgic General Gait Details: pain in back limiting tolerance requiring RW for support due to pain in back Gait velocity: functional Gait velocity interpretation: 1.31 - 2.62 ft/sec, indicative of limited community ambulator Stairs: Yes Stairs assistance: Min assist Stair Management:  One rail Right Number of Stairs: 4    ADL: ADL Overall ADL's : Needs assistance/impaired Eating/Feeding: Set up, Sitting Grooming: Wash/dry hands, Wash/dry face, Oral care, Brushing hair, Min guard, Standing Grooming Details (indicate cue type and reason): washing hair with shampoo cap (set-up/S) while seated Upper Body Bathing: Set up, Supervision/ safety, Sitting Lower Body Bathing: Minimal assistance, Sit to/from stand Upper Body Dressing : Set up, Supervision/safety, Sitting Lower Body Dressing: Minimal assistance, Sit to/from stand Toilet Transfer: Minimal assistance, Ambulation, Comfort height toilet, Grab bars Toilet Transfer Details (indicate  cue type and reason): no AD Toileting- Clothing Manipulation and Hygiene: Min guard, Sit to/from stand Functional mobility during ADLs: Moderate assistance (HHA; may do well with RW) General ADL Comments: impulsive at times; LOB during dynamic tasks  Cognition: Cognition Overall Cognitive Status: Impaired/Different from baseline Arousal/Alertness: Awake/alert Orientation Level: Oriented X4 Attention: Sustained Sustained Attention: Impaired Sustained Attention Impairment: Verbal complex Memory: Impaired Memory Impairment: Decreased recall of new information, Decreased short term memory Decreased Short Term Memory: Verbal basic Awareness: Impaired Awareness Impairment: Intellectual impairment, Emergent impairment, Anticipatory impairment Problem Solving: Impaired Problem Solving Impairment: Verbal complex Cognition Arousal/Alertness: Awake/alert Behavior During Therapy: WFL for tasks assessed/performed Overall Cognitive Status: Impaired/Different from baseline Area of Impairment: Memory, Safety/judgement Current Attention Level: Selective Memory: Decreased short-term memory Safety/Judgement: Decreased awareness of safety Awareness: Emergent Problem Solving: Requires verbal cues General Comments: tearful feeling poorly today back and head pain and nausea; repeated complaints frequently throughout with decreased STM  Physical Exam: Blood pressure (!) 133/76, pulse 60, temperature 97.6 F (36.4 C), temperature source Oral, resp. rate 20, height 5' 2" (1.575 m), weight 51.2 kg, SpO2 97 %. Physical Exam Vitals reviewed.  Constitutional:      General: She is not in acute distress.    Comments: Thin  HENT:     Head: Normocephalic.     Comments: Bilateral periorbital ecchymosis    Nose: Nose normal.  Eyes:     General:        Right eye: No discharge.        Left eye: No discharge.     Extraocular Movements: Extraocular movements intact.  Cardiovascular:     Rate and Rhythm:  Normal rate and regular rhythm.  Pulmonary:     Effort: Pulmonary effort is normal. No respiratory distress.     Breath sounds: Normal breath sounds. No stridor.  Abdominal:     General: Bowel sounds are normal. There is no distension.     Palpations: Abdomen is soft.  Musculoskeletal:     Cervical back: Normal range of motion.     Comments: No edema or tenderness in extremities  Skin:    General: Skin is warm and dry.     Comments: See above  Neurological:     Mental Status: She is alert.     Comments: Alert Makes eye contact with examiner.   She follows simple commands.   Provides her name and age and year but she is a bit tangential and needed some verbal cues at times to redirect.   She could not recall full events of the fall. Motor: Grossly 5/5 throughout Sensation intact light touch  Psychiatric:        Mood and Affect: Mood normal.        Behavior: Behavior normal.     Results for orders placed or performed during the hospital encounter of 04/29/20 (from the past 48 hour(s))  CBC     Status: Abnormal   Collection  Time: 05/04/20  9:33 AM  Result Value Ref Range   WBC 9.0 4.0 - 10.5 K/uL   RBC 3.58 (L) 3.87 - 5.11 MIL/uL   Hemoglobin 10.7 (L) 12.0 - 15.0 g/dL   HCT 33.3 (L) 36 - 46 %   MCV 93.0 80.0 - 100.0 fL   MCH 29.9 26.0 - 34.0 pg   MCHC 32.1 30.0 - 36.0 g/dL   RDW 15.2 11.5 - 15.5 %   Platelets 208 150 - 400 K/uL   nRBC 0.0 0.0 - 0.2 %    Comment: Performed at [facility], [place] [address] [place], [place] [address]  Basic metabolic panel     Status: Abnormal   Collection Time: 05/04/20  9:33 AM  Result Value Ref Range   Sodium 137 135 - 145 mmol/L   Potassium 4.0 3.5 - 5.1 mmol/L   Chloride 103 98 - 111 mmol/L   CO2 25 22 - 32 mmol/L   Glucose, Bld 126 (H) 70 - 99 mg/dL    Comment: Glucose reference range applies only to samples taken after fasting for at least 8 hours.   BUN 12 8 - 23 mg/dL   Creatinine, Ser 0.95 0.44 - 1.00 mg/dL   Calcium  9.0 8.9 - 10.3 mg/dL   GFR calc non Af Amer 54 (L) >60 mL/min   GFR calc Af Amer >60 >60 mL/min   Anion gap 9 5 - 15    Comment: Performed at [facility] [address] [place], [place] [address]  Magnesium     Status: None   Collection Time: 05/04/20  9:33 AM  Result Value Ref Range   Magnesium 1.7 1.7 - 2.4 mg/dL    Comment: Performed at [facility] [address], [place], [place] [address]  Phosphorus     Status: None   Collection Time: 05/04/20  9:33 AM  Result Value Ref Range   Phosphorus 3.7 2.5 - 4.6 mg/dL    Comment: Performed at [facility] [address] [place], [place] [address]  Basic metabolic panel     Status: Abnormal   Collection Time: 05/05/20  5:52 AM  Result Value Ref Range   Sodium 135 135 - 145 mmol/L   Potassium 4.3 3.5 - 5.1 mmol/L   Chloride 101 98 - 111 mmol/L   CO2 24 22 - 32 mmol/L   Glucose, Bld 124 (H) 70 - 99 mg/dL    Comment: Glucose reference range applies only to samples taken after fasting for at least 8 hours.   BUN 14 8 - 23 mg/dL   Creatinine, Ser 0.92 0.44 - 1.00 mg/dL   Calcium 9.1 8.9 - 10.3 mg/dL   GFR calc non Af Amer 56 (L) >60 mL/min   GFR calc Af Amer >60 >60 mL/min   Anion gap 10 5 - 15    Comment: Performed at [facility] [address], [place], [place] [address]  CBC with Differential/Platelet     Status: Abnormal   Collection Time: 05/05/20  5:52 AM  Result Value Ref Range   WBC 10.1 4.0 - 10.5 K/uL   RBC 3.70 (L) 3.87 - 5.11 MIL/uL   Hemoglobin 11.2 (L) 12.0 - 15.0 g/dL   HCT 34.5 (L) 36 - 46 %   MCV 93.2 80.0 - 100.0 fL   MCH 30.3 26.0 - 34.0 pg   MCHC 32.5 30.0 - 36.0 g/dL   RDW 15.3 11.5 - 15.5 %  Platelets 204 150 - 400 K/uL    Comment: REPEATED TO VERIFY   nRBC 0.0 0.0 - 0.2 %   Neutrophils Relative % 74 %   Neutro Abs 7.5 1.7 - 7.7 K/uL   Lymphocytes Relative 16 %   Lymphs Abs 1.6 0.7 - 4.0 K/uL   Monocytes Relative 8 %   Monocytes Absolute 0.8 0 - 1 K/uL   Eosinophils  Relative 1 %   Eosinophils Absolute 0.1 0 - 0 K/uL   Basophils Relative 0 %   Basophils Absolute 0.0 0 - 0 K/uL   Immature Granulocytes 1 %   Abs Immature Granulocytes 0.05 0.00 - 0.07 K/uL    Comment: Performed at [facility] [address] [place], [place] [address]  Magnesium     Status: None   Collection Time: 05/05/20  5:52 AM  Result Value Ref Range   Magnesium 1.7 1.7 - 2.4 mg/dL    Comment: Performed at [facility] [address] [place], [place] [address]   DG Lumbar Spine 2-3 Views  Result Date: 05/04/2020 CLINICAL DATA:  Status post fall. EXAM: LUMBAR SPINE - 2-3 VIEW COMPARISON:  May 17, 2013 FINDINGS: There is no evidence of an acute lumbar spine fracture. A chronic compression fracture deformity is seen involving the L1 vertebral body. Mild-to-moderate severity multilevel endplate sclerosis is seen. There is stable, approximately 2 mm retrolisthesis of the L3 vertebral body on L4. Mild multilevel intervertebral disc space narrowing is seen. This is most prominent along the posterior aspect of the L3-L4 level. Moderate severity calcification of the abdominal aorta is noted. IMPRESSION: 1. Stable chronic compression fracture deformity of the L1 vertebral body. 2. Stable minimal retrolisthesis of L3 on L4. 3. Mild to moderate multilevel degenerative disc disease. Electronically Signed   By: Virgina Norfolk M.D.   On: 05/04/2020 15:45    Medical Problem List and Plan: 1.  Decreased functional mobility secondary to traumatic bilateral frontal SAH with small amount of right occipital subarachnoid hemorrhage as well as nondisplaced superolateral orbital fracture  -patient may shower  -ELOS/Goals: 7--11 days/supervision/mod I  Admit to CIR 2.  Antithrombotics: -DVT/anticoagulation: Subcutaneous heparin  -antiplatelet therapy: N/A 3. Pain Management: Robaxin 500 mg every 6 hours and oxycodone as needed  Monitor with increased exertion. 4. Mood: Patient on Celexa  prior to admission.  -antipsychotic agents: N/A 5. Neuropsych: This patient is capable of making decisions on her own behalf. 6. Skin/Wound Care: Routine skin checks 7. Fluids/Electrolytes/Nutrition: Routine in and outs CMP ordered. 8.  Seizure prophylaxis.  Keppra 500 mg twice daily x 7 days.  EEG negative 9.  Hypertension.  Norvasc 2.5 mg daily.    Monitor with increased mobility. 10.  Question syncope.  Carotid Dopplers negative.  Echocardiogram unremarkable.  Monitor with increased mobility 11.  Hypothyroidism.  TSH 4.079.  Continue Synthroid 12.  Hyperlipidemia.  Continue Crestor. 13. CKD stage III.  Latest creatinine 1.03.    Creatinine 0.92 on 7/24, BMP ordered 14.  Acute blood loss anemia  Hemoglobin 11.2 on 7/24, CBC ordered  Cathlyn Parsons, PA-C 05/05/2020  I have personally performed a face to face diagnostic evaluation, including, but not limited to relevant history and physical exam findings, of this patient and developed relevant assessment and plan.  Additionally, I have reviewed and concur with the physician assistant's documentation above.  Delice Lesch, MD, ABPMR  The patient's status has not changed. The original post admission physician evaluation remains appropriate, and any changes  from the pre-admission screening or documentation from the acute chart are noted above.   Delice Lesch, MD, ABPMR

## 2020-05-05 NOTE — Plan of Care (Signed)
  Problem: Consults Goal: RH BRAIN INJURY PATIENT EDUCATION Description: Description: See Patient Education module for eduction specifics Outcome: Progressing   Problem: RH SKIN INTEGRITY Goal: RH STG MAINTAIN SKIN INTEGRITY WITH ASSISTANCE Description: STG Maintain Skin Integrity With mod I Assistance. Outcome: Progressing   Problem: RH COGNITION-NURSING Goal: RH STG ANTICIPATES NEEDS/CALLS FOR ASSIST W/ASSIST/CUES Description: STG Anticipates Needs/Calls for Assist With mod I Assistance/Cues. Outcome: Progressing   Problem: RH PAIN MANAGEMENT Goal: RH STG PAIN MANAGED AT OR BELOW PT'S PAIN GOAL Description: Less than 5 out of 10 Outcome: Progressing   Problem: RH KNOWLEDGE DEFICIT BRAIN INJURY Goal: RH STG INCREASE KNOWLEDGE OF SELF CARE AFTER BRAIN INJURY Description: Increase knowledge of self care and medication regimen through education from staff with supevision assist.  Outcome: Progressing   Problem: Consults Goal: RH BRAIN INJURY PATIENT EDUCATION Description: Description: See Patient Education module for eduction specifics Outcome: Progressing   Problem: RH SKIN INTEGRITY Goal: RH STG MAINTAIN SKIN INTEGRITY WITH ASSISTANCE Description: STG Maintain Skin Integrity With mod I Assistance. Outcome: Progressing   Problem: RH COGNITION-NURSING Goal: RH STG ANTICIPATES NEEDS/CALLS FOR ASSIST W/ASSIST/CUES Description: STG Anticipates Needs/Calls for Assist With mod I Assistance/Cues. Outcome: Progressing   Problem: RH PAIN MANAGEMENT Goal: RH STG PAIN MANAGED AT OR BELOW PT'S PAIN GOAL Description: Less than 5 out of 10 Outcome: Progressing   Problem: RH KNOWLEDGE DEFICIT BRAIN INJURY Goal: RH STG INCREASE KNOWLEDGE OF SELF CARE AFTER BRAIN INJURY Description: Increase knowledge of self care and medication regimen through education from staff with supevision assist.  Outcome: Progressing   Problem: RH BOWEL ELIMINATION Goal: RH STG MANAGE BOWEL WITH  ASSISTANCE Description: STG Manage Bowel with mod I Assistance. Outcome: Not Progressing Goal: RH STG MANAGE BOWEL W/MEDICATION W/ASSISTANCE Description: STG Manage Bowel with Medication with mod I Assistance. Outcome: Not Progressing

## 2020-05-05 NOTE — Progress Notes (Signed)
Consult PROGRESS NOTE    [name]   QBH:[id number]  DOB: 01-07-1931  DOA: 04/29/2020     6  PCP: Darreld Mclean, MD  CC: Witnessed syncopal fall at [facility] Course: [name] is an 86 yo CF with PMH hypothyroidism, hypertension, hyperlipidemia, glaucoma, osteoarthritis, breast cancer status post lumpectomy and hormone therapy, cervical cancer, Barrett's esophagus, dysphagia who presented to the hospital after a witnessed syncopal fall at church. She was brought to the hospital on 04/29/2020. She had no presyncopal symptoms including chest pain, dizziness, nor shortness of breath. She did hit her head during the fall. She typically is active and walks daily with her husband several miles. She had been having increased amount of falls the past few months prior to hospitalization. She was started on verapamil after a hospitalization in May 2020 for hypertensive crisis. In the ER on work-up she was found to be bradycardic with slightly increased blood pressure. In the ER she underwent work-up with CT head which revealed acute intracranial hemorrhage with bilateral frontal subarachnoid hemorrhage and small amount of right occipital subarachnoid hemorrhage along with a possible focus of intraparenchymal hemorrhage in the left frontal subcortical gray matter. It also showed left frontotemporal and periorbital preseptal scalp hematoma as well as nondisplaced superolateral orbital fracture.  Etiology of her syncope was considered possibly due to medication, diltiazem. She was taken off of this and transitioned to amlodipine. Her bradycardia improved with further monitoring on telemetry during hospitalization. She was evaluated by neurosurgery and trauma. She underwent repeat CT scan of her head as needed. There was no neurologic surgical intervention needed. She was also evaluated by ENT for her orbital fracture and also did not require any surgical intervention. She was treated  supportively during hospitalization. She was evaluated by physical therapy and Occupational Therapy with recommendations for [facility] inpatient rehab at time of discharge.   Interval History:  No events overnight. Resting in bed in no distress; husband bedside. She is planning on going to CIR today. Back feels better today.   Old records reviewed in assessment of this patient  ROS: Constitutional: negative for chills and fevers, Respiratory: negative for cough, Cardiovascular: negative for chest pain and Gastrointestinal: negative for abdominal pain  Assessment & Plan: Syncope and collapse -Consider due to medication effect. Verapamil has been discontinued -Bradycardia has improved. Continue monitoring on telemetry until rehab bed available -Possible history and contribution of A. fib. Patient will have a Holter monitor at time of discharge. Case discussed with cardiology  Bradycardia -No history of A. fib, stroke. -There is an inconsistent documentation in the chart about patient's possibly having a short lasting A. fib while in the ED.  Patient's daughter is raising the chance of A. fib as a cause of her syncope.  On 7/21, I d/w cardiology team. Patient is planned for a holter monitoring as an outpatient. -Patient was on verapamil at home for hypertension. Because of sinus bradycardia, verapamil is currently on hold. -Heart rate has improved while holding verapamil. Patient will remain off of this at time of discharge  TBI (traumatic brain injury) ([place]) Acute intracranial hemorrhage with bilateral frontal subarachnoid hemorrhage and small amount of right occipital subarachnoid hemorrhage. - evaluated by trauma surgery and ENT - repeat CTH stable; no surgical interventions required - patient medically stable from surgery standpoint to rehab when bed available   SAH (subarachnoid hemorrhage) ([place]) -See TBI  Orbital fracture, closed, initial encounter [facility]) ENT consultation obtained.  No  need of  surgical intervention. -EOMI. Continue supportive care and pain control -Outpatient follow-up with ophthalmology if develops vision changes; none during hospitalization  Hyperlipidemia -Continue Crestor  Back pain -Osteoarthritis appreciated in C-spine. Likely extends further. Pain also contributed by muscle spasms as well as prolonged hospitalization being bedbound -Continue Robaxin -Continue scheduled Tylenol -Add heating pad as needed  Essential hypertension -Continue amlodipine. Verapamil has been discontinued   Antimicrobials: N/A  DVT prophylaxis: H SQ Code Status: Full Family Communication: husband  Objective: Blood pressure (!) 133/76, pulse 60, temperature 97.6 F (36.4 C), temperature source Oral, resp. rate 20, height 5\' 2"  (1.575 m), weight 51.2 kg, SpO2 97 %.  Examination: General appearance: Pleasant elderly woman resting in bed in no distress with facial bruising appreciated Head: Bruising appreciated on bilateral face Eyes: Erythematous conjunctiva on right eye. EOMI Lungs: clear to auscultation bilaterally Heart: regular rate and rhythm and S1, S2 normal Abdomen: normal findings: bowel sounds normal and soft, non-tender Extremities: No edema Skin: mobility and turgor normal Neurologic: Grossly normal   Procedures:   None  Data Reviewed: I have personally reviewed following labs and imaging studies Results for orders placed or performed during the hospital encounter of 04/29/20 (from the past 24 hour(s))  Basic metabolic panel     Status: Abnormal   Collection Time: 05/05/20  5:52 AM  Result Value Ref Range   Sodium 135 135 - 145 mmol/L   Potassium 4.3 3.5 - 5.1 mmol/L   Chloride 101 98 - 111 mmol/L   CO2 24 22 - 32 mmol/L   Glucose, Bld 124 (H) 70 - 99 mg/dL   BUN 14 8 - 23 mg/dL   Creatinine, Ser 0.92 0.44 - 1.00 mg/dL   Calcium 9.1 8.9 - 10.3 mg/dL   GFR calc non Af Amer 56 (L) >60 mL/min   GFR calc Af Amer >60 >60 mL/min   Anion gap  10 5 - 15  CBC with Differential/Platelet     Status: Abnormal   Collection Time: 05/05/20  5:52 AM  Result Value Ref Range   WBC 10.1 4.0 - 10.5 K/uL   RBC 3.70 (L) 3.87 - 5.11 MIL/uL   Hemoglobin 11.2 (L) 12.0 - 15.0 g/dL   HCT 34.5 (L) 36 - 46 %   MCV 93.2 80.0 - 100.0 fL   MCH 30.3 26.0 - 34.0 pg   MCHC 32.5 30.0 - 36.0 g/dL   RDW 15.3 11.5 - 15.5 %   Platelets 204 150 - 400 K/uL   nRBC 0.0 0.0 - 0.2 %   Neutrophils Relative % 74 %   Neutro Abs 7.5 1.7 - 7.7 K/uL   Lymphocytes Relative 16 %   Lymphs Abs 1.6 0.7 - 4.0 K/uL   Monocytes Relative 8 %   Monocytes Absolute 0.8 0 - 1 K/uL   Eosinophils Relative 1 %   Eosinophils Absolute 0.1 0 - 0 K/uL   Basophils Relative 0 %   Basophils Absolute 0.0 0 - 0 K/uL   Immature Granulocytes 1 %   Abs Immature Granulocytes 0.05 0.00 - 0.07 K/uL  Magnesium     Status: None   Collection Time: 05/05/20  5:52 AM  Result Value Ref Range   Magnesium 1.7 1.7 - 2.4 mg/dL    Recent Results (from the past 240 hour(s))  SARS Coronavirus 2 by RT PCR (hospital order, performed in [facility] hospital lab) Nasopharyngeal Nasopharyngeal Swab     Status: None   Collection Time: 04/29/20  2:22 PM  Specimen: Nasopharyngeal Swab  Result Value Ref Range Status   SARS Coronavirus 2 NEGATIVE NEGATIVE Final    Comment: (NOTE) SARS-CoV-2 target nucleic acids are NOT DETECTED.  The SARS-CoV-2 RNA is generally detectable in upper and lower respiratory specimens during the acute phase of infection. The lowest concentration of SARS-CoV-2 viral copies this assay can detect is 250 copies / mL. A negative result does not preclude SARS-CoV-2 infection and should not be used as the sole basis for treatment or other patient management decisions.  A negative result may occur with improper specimen collection / handling, submission of specimen other than nasopharyngeal swab, presence of viral mutation(s) within the areas targeted by this assay, and inadequate  number of viral copies (<250 copies / mL). A negative result must be combined with clinical observations, patient history, and epidemiological information.  Fact Sheet for Patients:   StrictlyIdeas.no  Fact Sheet for Healthcare Providers: BankingDealers.co.za  This test is not yet approved or  cleared by the [place] FDA and has been authorized for detection and/or diagnosis of SARS-CoV-2 by FDA under an Emergency Use Authorization (EUA).  This EUA will remain in effect (meaning this test can be used) for the duration of the COVID-19 declaration under Section 564(b)(1) of the Act, 21 U.S.C. section 360bbb-3(b)(1), unless the authorization is terminated or revoked sooner.  Performed at [facility], [place] [address] [place], [place] [address]   MRSA PCR Screening     Status: None   Collection Time: 04/29/20  3:31 PM   Specimen: Nasal Mucosa; Nasopharyngeal  Result Value Ref Range Status   MRSA by PCR NEGATIVE NEGATIVE Final    Comment:        The GeneXpert MRSA Assay (FDA approved for NASAL specimens only), is one component of a comprehensive MRSA colonization surveillance program. It is not intended to diagnose MRSA infection nor to guide or monitor treatment for MRSA infections. Performed at [facility], [place] [address] [place], [place] [address]      Radiology Studies: DG Lumbar Spine 2-3 Views  Result Date: 05/04/2020 CLINICAL DATA:  Status post fall. EXAM: LUMBAR SPINE - 2-3 VIEW COMPARISON:  May 17, 2013 FINDINGS: There is no evidence of an acute lumbar spine fracture. A chronic compression fracture deformity is seen involving the L1 vertebral body. Mild-to-moderate severity multilevel endplate sclerosis is seen. There is stable, approximately 2 mm retrolisthesis of the L3 vertebral body on L4. Mild multilevel intervertebral disc space narrowing is seen. This is most prominent along the posterior aspect  of the L3-L4 level. Moderate severity calcification of the abdominal aorta is noted. IMPRESSION: 1. Stable chronic compression fracture deformity of the L1 vertebral body. 2. Stable minimal retrolisthesis of L3 on L4. 3. Mild to moderate multilevel degenerative disc disease. Electronically Signed   By: Virgina Norfolk M.D.   On: 05/04/2020 15:45   DG Lumbar Spine 2-3 Views  Final Result    VAS US CAROTID  Final Result    CT HEAD WO CONTRAST  Final Result    CT Cervical Spine Wo Contrast  Final Result    CT Maxillofacial Wo Contrast  Final Result    CT Head Wo Contrast  Final Result       Scheduled Meds: . acetaminophen  1,000 mg Oral Q6H  . amLODipine  2.5 mg Oral Daily  . Chlorhexidine Gluconate Cloth  6 each Topical Daily  . docusate sodium  100 mg Oral BID  . feeding supplement (ENSURE ENLIVE)  237 mL Oral BID BM  . heparin  5,000 Units Subcutaneous Q8H  . latanoprost  1 drop Both Eyes QHS  . levETIRAcetam  500 mg Oral BID  . levothyroxine  50 mcg Oral Q0600  . lidocaine  1 patch Transdermal Q24H  . methocarbamol  500 mg Oral Q6H  . multivitamin with minerals  1 tablet Oral Daily  . polyethylene glycol  17 g Oral Daily  . rosuvastatin  10 mg Oral Daily   PRN Meds: bisacodyl, hydrALAZINE, ondansetron **OR** ondansetron (ZOFRAN) IV, oxyCODONE Continuous Infusions:    LOS: 6 days  Time spent: Greater than 50% of the 35 minute visit was spent in counseling/coordination of care for the patient as laid out in the A&P.   Dwyane Dee, MD [facility] 05/05/2020, 1:56 PM   Contact via secure chat.  To contact the attending provider between 7A-7P or the covering provider during after hours 7P-7A, please log into the web site [email or web address] and access using universal [place] password for that web site. If you do not have the password, please call the hospital operator.

## 2020-05-05 NOTE — Progress Notes (Signed)
Patient admitted about 1500 to 4M03. Patient alert and oriented x4. Patient does report back pain with movement. Admission booklet given to patient and fall prevention, rehab process explained. All questions answered. Patient left with call bell and belongings within reach.

## 2020-05-05 NOTE — Consult Note (Signed)
Ophthalmology Consult  This is a 86 yo female with PMHx of HTN who is s/p fall with trauma to head and orbit of the left eye.  Pt [place] consciousness in church and hit the left side of the head and had CT scan showing acute intracranial hemorrhage with bilateral frontal subarachnoid hemorrhage and small amount of right occipital subarachnoid hemorrhage.  Possible focus of intraparenchymal hemorrhage in the left frontal and subcortical gray-white matter.  Left frontotemporal periorbital preseptal scalp hematoma as well as nondisplaced superolateral orbital fracture. Pt started having snowflake like visual changes in both eyes starting last night.  No other ocular complaints.  Pt has h/o cataract surgery in both eyes and vitrectomy in the left eye.  No new flashes, decreased vision, diplopia, or eye pain.  On exam patient was found to be 20/20 in both eyes with near card and +2.50 reading lens.  PERRLA.   IOP was 17 in both eyes.  Pt had full EOM's and Visual field was full to confrontation in both eyes. On examination pt had ecchymosis around both eyes but minimal swelling.  Pt had bilateral subconjunctival hemorrhages but borders were seen and is resolving.  Cornea was clear in both eyes.  Iris was reactive and round in both eyes and pt had PCIOL's in place in both eyes.  On dilated exam the retina was flat and intact with no retinal breaks or tears.  There was no vitreous hemorrhages in either eye.  No Commotio retinae was seen in either eye.    A/P 1. Trauma with ecchymosis around both eyes and resolving subconjunctival hemorrhages in both eyes.  The rest of the eye exam was within normal limits.  No retinal breaks or tears seen.  Pt could have a change in her floaters due to the trauma and this will settle out through time.  Pt needs to have a regular eye exam with her ophthalmologist once discharged.  She states she has a scheduled exam with her ophthalmologist in august.  If she has any new changes please  re consult me. 2.Pseudophakic OU: stable 3.Hyperttension:  No retinopathy seen.  Stressed blood pressure control and relation to ocular disease.  Thank you for giving me the opportunity to participate in the care of this patient.  Please feel free to contact me if you have any concerns.  Darleen Crocker, M.D. (cell) [phone] (office) [phone]

## 2020-05-06 ENCOUNTER — Inpatient Hospital Stay (HOSPITAL_COMMUNITY): Payer: Medicare Other

## 2020-05-06 ENCOUNTER — Inpatient Hospital Stay (HOSPITAL_COMMUNITY): Payer: Medicare Other | Admitting: Speech Pathology

## 2020-05-06 DIAGNOSIS — S069X9D Unspecified intracranial injury with loss of consciousness of unspecified duration, subsequent encounter: Secondary | ICD-10-CM

## 2020-05-06 DIAGNOSIS — N1831 Chronic kidney disease, stage 3a: Secondary | ICD-10-CM

## 2020-05-06 DIAGNOSIS — I10 Essential (primary) hypertension: Secondary | ICD-10-CM

## 2020-05-06 DIAGNOSIS — D62 Acute posthemorrhagic anemia: Secondary | ICD-10-CM

## 2020-05-06 NOTE — Evaluation (Signed)
Occupational Therapy Assessment and Plan  Patient Details  Name: [name] MRN: [id number] Date of Birth: Feb 11, 1931  OT Diagnosis: cognitive deficits and muscle weakness (generalized) Rehab Potential: Rehab Potential (ACUTE ONLY): Good ELOS: 5-7 days   Today's Date: 05/06/2020 OT Individual Time: 5366-4403 OT Individual Time Calculation (min): 84 min     Hospital Problem: Principal Problem:   TBI (traumatic brain injury) [facility])   Past Medical History:  Past Medical History:  Diagnosis Date   Autoimmune disease ([place])    Barrett's esophagus    Bone lesion    sacrum   Breast cancer ([place]) 2010   cT1 N0 M0; ER+/ PR+/ HER2 neg; s/p lumpectomy 07/2010; started adjuvant hormonal therapy 10/2009   Cervical cancer ([place])    Dysphagia    Fx distal radius NEC-closed    Glaucoma    Humerus fracture 03/2012   impacted left humueral neck fracture, treated by Dr. Amedeo Plenty   Hyperlipidemia    Hypertension    Osteoporosis 03/18/2012   Pneumonia    Thyroid disease    Past Surgical History:  Past Surgical History:  Procedure Laterality Date   ABDOMINAL HYSTERECTOMY  1972   cancer          1 tube 1 ovary   APPENDECTOMY     BREAST LUMPECTOMY Left 2010   colon polyp removal     EYE SURGERY     HAND SURGERY     ortho procedures     multiple   OVARIAN CYST REMOVAL  [place]   left    Assessment & Plan Clinical Impression: [name] is an 86 year old right-handed female with history of left breast cancer status post chemotherapy and radiation in 2010, Covid January 2021, hypertension, hypothyroidism, Barrett's esophagus, hyperlipidemia.  History taken from chart review, daughter, and patient.  Patient lives with her husband and independent prior to admission and active.  1 level home 5 steps to entry.  She presented on 04/29/2020 after a fall, ?syncopal event while at church with transient loss of consciousness.  She could not recall the  events of the fall.  Cranial CT scan showed acute intracranial hemorrhage with bilateral frontal subarachnoid hemorrhage and small amount of right occipital subarachnoid hemorrhage.  Possible focus of intraparenchymal hemorrhage in the left frontal and subcortical gray-white matter.  Left frontotemporal periorbital preseptal scalp hematoma as well as nondisplaced superolateral orbital fracture.  CT cervical spine negative.  Admission chemistries unremarkable except glucose 116 creatinine 1.06, hemoglobin 12.2, WBC 7300, SARS coronavirus negative, urinalysis negative nitrite.  Neurosurgery, Dr. Sherley Bounds recommended conservative care with follow-up cranial CT scan on 05/01/2020.  Maintained on Keppra for seizure prophylaxis.  No surgical intervention needed for left lateral orbital fracture per Dr. Melida Quitter.  EEG negative for seizure.  Echocardiogram with ejection fraction of 65%. No wall motion abnormalities.  Carotid Dopplers with no ICA stenosis.  Follow-up chemistries close monitoring of creatinine improved 0.91 hemoglobin 10.9.  Patient was cleared to begin subcutaneous heparin for DVT prophylaxis.  Tolerating a regular diet.  Therapy evaluations completed and patient was admitted for a comprehensive rehab program.  Please see preadmission assessment from earlier today as well. Patient transferred to CIR on 05/05/2020 .    Patient currently requires min with basic self-care skills secondary to muscle weakness, decreased awareness, decreased problem solving, decreased safety awareness and delayed processing and decreased standing balance and decreased balance strategies.  Prior to hospitalization, patient could complete ADLs with independent .  Patient will benefit from skilled intervention to decrease level of assist with basic self-care skills prior to discharge home with care partner.  Anticipate patient will require 24 hour supervision and no further OT follow recommended.  OT - End of  Session Activity Tolerance: Tolerates 30+ min activity with multiple rests Endurance Deficit: Yes Endurance Deficit Description: generalized weakness OT Assessment Rehab Potential (ACUTE ONLY): Good OT Patient demonstrates impairments in the following area(s): Balance;Endurance;Motor;Safety OT Basic ADL's Functional Problem(s): Bathing;Dressing;Toileting OT Transfers Functional Problem(s): Toilet;Tub/Shower OT Additional Impairment(s): None OT Plan OT Intensity: Minimum of 1-2 x/day, 45 to 90 minutes OT Frequency: 5 out of 7 days OT Duration/Estimated Length of Stay: 5-7 days OT Treatment/Interventions: Balance/vestibular training;DME/adaptive equipment instruction;Patient/family education;Therapeutic Activities;Therapeutic Exercise;Community reintegration;Functional mobility training;Self Care/advanced ADL retraining;UE/LE Strength taining/ROM;Discharge planning;UE/LE Coordination activities OT Self Feeding Anticipated Outcome(s): no goal set OT Basic Self-Care Anticipated Outcome(s): mod I OT Toileting Anticipated Outcome(s): mod I OT Bathroom Transfers Anticipated Outcome(s): mod I OT Recommendation Patient destination: Home Follow Up Recommendations: None Equipment Recommended: None recommended by OT  OT Evaluation Precautions/Restrictions  Precautions Precautions: Fall Restrictions Weight Bearing Restrictions: No Pain Pain Assessment Pain Scale: 0-10 Pain Score: 0-No pain Pain Type: Chronic pain Pain Location: Back Pain Descriptors / Indicators: Aching Pain Frequency: Intermittent Pain Onset: With Activity Pain Intervention(s): Medication (See eMAR) (priot to therapy) Home Living/Prior [place] expects to be discharged to:: Private residence Living Arrangements: Spouse/significant other Available Help at Discharge: Family, Available 24 hours/day Type of Home: House Home Access: Stairs to enter [employer] of Steps:  4-5 Entrance Stairs-Rails: Right, Left, Can reach both Home Layout: One level Bathroom Shower/Tub: Tub/shower unit, Multimedia programmer: Handicapped height Bathroom Accessibility: Yes  Lives With: Spouse, Family, Friend(s) IADL History Homemaking Responsibilities: Yes Meal Prep Responsibility: Secondary Laundry Responsibility: Secondary Cleaning Responsibility: Secondary Bill Paying/Finance Responsibility: Secondary Shopping Responsibility: Secondary Child Care Responsibility: No Current License: Yes Mode of Transportation: Car Occupation: Retired Prior Function Level of Independence: Independent with homemaking with ambulation, Independent with gait, Independent with transfers, Independent with basic ADLs  Able to Take Stairs?: Yes Driving: Yes Vocation: Retired Comments: drives; active; enjoys hiking; has had several falls Vision Baseline Vision/History: Wears glasses Wears Glasses: Reading only Patient Visual Report: No change from baseline Vision Assessment?: Yes Eye Alignment: Within Functional Limits Ocular Range of Motion: Within Functional Limits Alignment/Gaze Preference: Within Defined Limits Tracking/Visual Pursuits: Able to track stimulus in all quads without difficulty Saccades: Within functional limits Convergence: Impaired - to be further tested in functional context Additional Comments: Previous c/o some L inattention/visual deficits, not observed this session Perception  Perception: Within Functional Limits Praxis Praxis: Intact Cognition Overall Cognitive Status: Impaired/Different from baseline Arousal/Alertness: Awake/alert Orientation Level: Person;Place;Situation Person: Oriented Place: Oriented Situation: Oriented Year: 2021 Month: July Day of Week: Correct Memory: Appears intact Immediate Memory Recall: Sock;Blue;Bed Memory Recall Sock: Without Cue Memory Recall Blue: Without Cue Memory Recall Bed: With Cue Attention:  Selective Selective Attention: Appears intact Awareness: Impaired Awareness Impairment: Emergent impairment Problem Solving: Impaired Safety/Judgment: Impaired Comments: Poor judgement/self awareness Sensation Sensation Light Touch: Appears Intact Hot/Cold: Appears Intact Proprioception: Appears Intact Coordination Gross Motor Movements are Fluid and Coordinated: No Fine Motor Movements are Fluid and Coordinated: Yes Coordination and Movement Description: generalized weakness Motor  Motor Motor: Other (comment) Motor - Skilled Clinical Observations: generalized weakness and instability  Trunk/Postural Assessment  Cervical Assessment Cervical Assessment: Within Functional Limits Thoracic Assessment Thoracic Assessment: Within Functional Limits Lumbar Assessment Lumbar Assessment: Within Functional  Limits Postural Control Postural Control: Deficits on evaluation Righting Reactions: delayed Protective Responses: delayed  Balance Balance Balance Assessed: Yes Static Sitting Balance Static Sitting - Level of Assistance: 6: Modified independent (Device/Increase time) Dynamic Sitting Balance Dynamic Sitting - Level of Assistance: 6: Modified independent (Device/Increase time) Static Standing Balance Static Standing - Balance Support: No upper extremity supported Static Standing - Level of Assistance: 5: Stand by assistance Dynamic Standing Balance Dynamic Standing - Balance Support: No upper extremity supported Dynamic Standing - Level of Assistance: 4: Min assist Extremity/Trunk Assessment RUE Assessment RUE Assessment: Within Functional Limits (WFL for age) LUE Assessment LUE Assessment: Within Functional Limits (WFL for age)  Care Tool Care Tool Self Care Eating   Eating Assist Level: Set up assist    Oral Care    Oral Care Assist Level: Set up assist    Bathing   Body parts bathed by patient: Right arm;Left arm;Chest;Abdomen;Front perineal area;Buttocks;Right  upper leg;Left upper leg;Right lower leg;Left lower leg;Face     Assist Level: Minimal Assistance - Patient > 75%    Upper Body Dressing(including orthotics)   What is the patient wearing?: Pull over shirt   Assist Level: Supervision/Verbal cueing    Lower Body Dressing (excluding footwear)   What is the patient wearing?: Pants Assist for lower body dressing: Minimal Assistance - Patient > 75%    Putting on/Taking off footwear   What is the patient wearing?: Non-skid slipper socks Assist for footwear: Supervision/Verbal cueing       Care Tool Toileting Toileting activity   Assist for toileting: Minimal Assistance - Patient > 75%     Care Tool Bed Mobility Roll left and right activity   Roll left and right assist level: Supervision/Verbal cueing    Sit to lying activity   Sit to lying assist level: Supervision/Verbal cueing    Lying to sitting edge of bed activity   Lying to sitting edge of bed assist level: Supervision/Verbal cueing     Care Tool Transfers Sit to stand transfer   Sit to stand assist level: Contact Guard/Touching assist    Chair/bed transfer   Chair/bed transfer assist level: Minimal Assistance - Patient > 75%     Toilet transfer   Assist Level: Minimal Assistance - Patient > 75%     Care Tool Cognition Expression of Ideas and Wants Expression of Ideas and Wants: Without difficulty (complex and basic) - expresses complex messages without difficulty and with speech that is clear and easy to understand   Understanding Verbal and Non-Verbal Content Understanding Verbal and Non-Verbal Content: Understands (complex and basic) - clear comprehension without cues or repetitions   Memory/Recall Ability *first 3 days only Memory/Recall Ability *first 3 days only: Current season;That he or she is in a hospital/hospital unit;Staff names and faces    Refer to Care Plan for Long Term Goals  SHORT TERM GOAL WEEK 1 OT Short Term Goal 1 (Week 1): STG=LTG d/t  ELOS  Recommendations for other services: None    Skilled Therapeutic Intervention Skilled OT evaluation completed. Pt edu on rehab expectations, TBI education, safety awareness, and OT POC. Pt required cueing throughout session for impulsivity, safety awareness, and impairments from TBI. ADLs performed at shower level as described below. Pt completed ambulatory transfer throughout session with min HHA. Pt prefers to furniture walk, holding onto walls, furniture, anything she can grab yet is resistant to use of AE. Discussed home set up and use of AE in the home. Pt completed 300 ft of  functional mobility without any AD with min A- CGA. Pt cued for reducing reliance on walls. Pt was left supine with all needs met, bed alarm set.   ADL ADL Eating: Set up Where Assessed-Eating: Bed level Grooming: Supervision/safety Where Assessed-Grooming: Sitting at sink Upper Body Bathing: Supervision/safety Where Assessed-Upper Body Bathing: Shower Lower Body Bathing: Contact guard Where Assessed-Lower Body Bathing: Shower Upper Body Dressing: Supervision/safety Lower Body Dressing: Minimal assistance Where Assessed-Lower Body Dressing: Standing at sink Toileting: Minimal assistance Where Assessed-Toileting: Glass blower/designer Method: Counselling psychologist: Emergency planning/management officer Transfer: Environmental education officer Method: Heritage manager: Shower seat with back Mobility  Bed Mobility Bed Mobility: Supine to Sit;Sit to Supine Supine to Sit: Supervision/Verbal cueing Sit to Supine: Supervision/Verbal cueing Transfers Sit to Stand: Contact Guard/Touching assist Stand to Sit: Contact Guard/Touching assist   Discharge Criteria: Patient will be discharged from OT if patient refuses treatment 3 consecutive times without medical reason, if treatment goals not met, if there is a change in medical status, if patient makes no progress towards goals  or if patient is discharged from hospital.  The above assessment, treatment plan, treatment alternatives and goals were discussed and mutually agreed upon: by patient  Curtis Sites 05/06/2020, 11:04 AM

## 2020-05-06 NOTE — Progress Notes (Signed)
[place] PHYSICAL MEDICINE & REHABILITATION PROGRESS NOTE  Subjective/Complaints: Patient seen sitting up in bed this morning.  She states she slept well overnight.  She states she is ready begin her first day of therapies today.  She was seen by ophthalmology yesterday, notes reviewed-stable, no changes.  ROS: Denies CP, shortness of breath, nausea, vomiting, diarrhea.  Objective: Vital Signs: Blood pressure (!) 134/64, pulse 58, temperature 97.8 F (36.6 C), temperature source Oral, resp. rate 17, height 5\' 2"  (1.575 m), weight 51.3 kg, SpO2 95 %. DG Lumbar Spine 2-3 Views  Result Date: 05/04/2020 CLINICAL DATA:  Status post fall. EXAM: LUMBAR SPINE - 2-3 VIEW COMPARISON:  May 17, 2013 FINDINGS: There is no evidence of an acute lumbar spine fracture. A chronic compression fracture deformity is seen involving the L1 vertebral body. Mild-to-moderate severity multilevel endplate sclerosis is seen. There is stable, approximately 2 mm retrolisthesis of the L3 vertebral body on L4. Mild multilevel intervertebral disc space narrowing is seen. This is most prominent along the posterior aspect of the L3-L4 level. Moderate severity calcification of the abdominal aorta is noted. IMPRESSION: 1. Stable chronic compression fracture deformity of the L1 vertebral body. 2. Stable minimal retrolisthesis of L3 on L4. 3. Mild to moderate multilevel degenerative disc disease. Electronically Signed   By: Virgina Norfolk M.D.   On: 05/04/2020 15:45   Recent Labs    05/05/20 0552 05/05/20 1643  WBC 10.1 9.7  HGB 11.2* 11.1*  HCT 34.5* 34.2*  PLT 204 253   Recent Labs    05/04/20 0933 05/04/20 0933 05/05/20 0552 05/05/20 1643  NA 137  --  135  --   K 4.0  --  4.3  --   CL 103  --  101  --   CO2 25  --  24  --   GLUCOSE 126*  --  124*  --   BUN 12  --  14  --   CREATININE 0.95   < > 0.92 0.84  CALCIUM 9.0  --  9.1  --    < > = values in this interval not displayed.    Physical Exam: BP (!)  134/64 (BP Location: Right Arm)    Pulse 58    Temp 97.8 F (36.6 C) (Oral)    Resp 17    Ht 5\' 2"  (1.575 m)    Wt 51.3 kg    SpO2 95%    BMI 20.69 kg/m  Constitutional: No distress . Vital signs reviewed.  Thin. HENT: Normocephalic.  Bilateral periorbital ecchymosis. Eyes: EOMI. No discharge. Cardiovascular: No JVD. Respiratory: Normal effort.  No stridor. GI: Non-distended. Skin: See above.  Intact.  Warm and dry. Psych: Normal mood.  Normal behavior. Musc: No edema in extremities.  No tenderness in extremities. Neuro: Alert Makes eye contact with examiner.   She follows simple commands.   Motor: Grossly 5/5 throughout, unchanged  Assessment/Plan: 1. Functional deficits secondary to TBI which require 3+ hours per day of interdisciplinary therapy in a comprehensive inpatient rehab setting.  Physiatrist is providing close team supervision and 24 hour management of active medical problems listed below.  Physiatrist and rehab team continue to assess barriers to discharge/monitor patient progress toward functional and medical goals  Care Tool:  Bathing              Bathing assist       Upper Body Dressing/Undressing Upper body dressing        Upper body assist  Lower Body Dressing/Undressing Lower body dressing            Lower body assist       Toileting Toileting    Toileting assist Assist for toileting: Contact Guard/Touching assist     Transfers Chair/bed transfer  Transfers assist           Locomotion Ambulation   Ambulation assist              Walk 10 feet activity   Assist           Walk 50 feet activity   Assist           Walk 150 feet activity   Assist           Walk 10 feet on uneven surface  activity   Assist           Wheelchair     Assist               Wheelchair 50 feet with 2 turns activity    Assist            Wheelchair 150 feet activity     Assist             Medical Problem List and Plan: 1.  Decreased functional mobility secondary to traumatic bilateral frontal SAH with small amount of right occipital subarachnoid hemorrhage as well as nondisplaced superolateral orbital fracture  Begin CIR evaluations 2.  Antithrombotics: -DVT/anticoagulation: Subcutaneous heparin             -antiplatelet therapy: N/A 3. Pain Management: Robaxin 500 mg every 6 hours and oxycodone as needed  Monitor with increased exertion 4. Mood: Patient on Celexa prior to admission.             -antipsychotic agents: N/A 5. Neuropsych: This patient is capable of making decisions on her own behalf. 6. Skin/Wound Care: Routine skin checks 7. Fluids/Electrolytes/Nutrition: Routine in and outs. 8.  Seizure prophylaxis.    Keppra 500 mg twice daily x 7 days.  EEG negative 9.  Hypertension.  Norvasc 2.5 mg daily.               Monitor with increased mobility.   10.  Question syncope.  Carotid Dopplers negative.  Echocardiogram unremarkable.  Monitor with increased mobility 11.  Hypothyroidism.  TSH 4.079.    Continue Synthroid 12.  Hyperlipidemia.  Continue Crestor. 13. CKD stage III.  Latest creatinine 1.03.               Creatinine 0.92 on 7/24, CMP ordered 14.  Acute blood loss anemia             Hemoglobin 11.1 on 7/24, CBC ordered  LOS: 1 days A FACE TO FACE EVALUATION WAS PERFORMED  [name] [name] 05/06/2020, 7:43 AM

## 2020-05-06 NOTE — Plan of Care (Signed)
  Problem: Consults Goal: RH BRAIN INJURY PATIENT EDUCATION Description: Description: See Patient Education module for eduction specifics Outcome: Progressing   Problem: RH SKIN INTEGRITY Goal: RH STG MAINTAIN SKIN INTEGRITY WITH ASSISTANCE Description: STG Maintain Skin Integrity With mod I Assistance. Outcome: Progressing   Problem: RH COGNITION-NURSING Goal: RH STG ANTICIPATES NEEDS/CALLS FOR ASSIST W/ASSIST/CUES Description: STG Anticipates Needs/Calls for Assist With mod I Assistance/Cues. Outcome: Progressing   Problem: RH PAIN MANAGEMENT Goal: RH STG PAIN MANAGED AT OR BELOW PT'S PAIN GOAL Description: Less than 5 out of 10 Outcome: Progressing   Problem: RH KNOWLEDGE DEFICIT BRAIN INJURY Goal: RH STG INCREASE KNOWLEDGE OF SELF CARE AFTER BRAIN INJURY Description: Increase knowledge of self care and medication regimen through education from staff with supevision assist.  Outcome: Progressing   Problem: RH BOWEL ELIMINATION Goal: RH STG MANAGE BOWEL WITH ASSISTANCE Description: STG Manage Bowel with mod I Assistance. Outcome: Not Progressing Goal: RH STG MANAGE BOWEL W/MEDICATION W/ASSISTANCE Description: STG Manage Bowel with Medication with mod I Assistance. Outcome: Not Progressing

## 2020-05-06 NOTE — Evaluation (Signed)
Physical Therapy Assessment and Plan  Patient Details  Name: [name] MRN: [id number] Date of Birth: 06/24/1931  PT Diagnosis: Abnormal posture, Abnormality of gait, Cognitive deficits, Difficulty walking, Impaired cognition and Pain in back Rehab Potential: Excellent ELOS: 5-7 days   Today's Date: 05/06/2020 PT Individual Time: 0800-0900 PT Individual Time Calculation (min): 60 min    Hospital Problem: Principal Problem:   TBI (traumatic brain injury) [facility])   Past Medical History:  Past Medical History:  Diagnosis Date   Autoimmune disease ([place])    Barrett's esophagus    Bone lesion    sacrum   Breast cancer ([place]) 2010   cT1 N0 M0; ER+/ PR+/ HER2 neg; s/p lumpectomy 07/2010; started adjuvant hormonal therapy 10/2009   Cervical cancer ([place])    Dysphagia    Fx distal radius NEC-closed    Glaucoma    Humerus fracture 03/2012   impacted left humueral neck fracture, treated by Dr. Amedeo Plenty   Hyperlipidemia    Hypertension    Osteoporosis 03/18/2012   Pneumonia    Thyroid disease    Past Surgical History:  Past Surgical History:  Procedure Laterality Date   ABDOMINAL HYSTERECTOMY  1972   cancer          1 tube 1 ovary   APPENDECTOMY     BREAST LUMPECTOMY Left 2010   colon polyp removal     EYE SURGERY     HAND SURGERY     ortho procedures     multiple   OVARIAN CYST REMOVAL  [place]   left    Assessment & Plan Clinical Impression: Patient is a 86 y.o. year old right-handed female with history of left breast cancer status post chemotherapy and radiation in 2010, Covid January 2021, hypertension, hypothyroidism, Barrett's esophagus, hyperlipidemia.  History taken from chart review, daughter, and patient.  Patient lives with her husband and independent prior to admission and active.  1 level home 5 steps to entry.  She presented on 04/29/2020 after a fall, ?syncopal event while at church with transient loss of consciousness.   She could not recall the events of the fall.  Cranial CT scan showed acute intracranial hemorrhage with bilateral frontal subarachnoid hemorrhage and small amount of right occipital subarachnoid hemorrhage.  Possible focus of intraparenchymal hemorrhage in the left frontal and subcortical gray-white matter.  Left frontotemporal periorbital preseptal scalp hematoma as well as nondisplaced superolateral orbital fracture.  CT cervical spine negative.  Admission chemistries unremarkable except glucose 116 creatinine 1.06, hemoglobin 12.2, WBC 7300, SARS coronavirus negative, urinalysis negative nitrite.  Neurosurgery, Dr. Sherley Bounds recommended conservative care with follow-up cranial CT scan on 05/01/2020.  Maintained on Keppra for seizure prophylaxis.  No surgical intervention needed for left lateral orbital fracture per Dr. Melida Quitter.  EEG negative for seizure.  Echocardiogram with ejection fraction of 65%. No wall motion abnormalities.  Carotid Dopplers with no ICA stenosis.  Follow-up chemistries close monitoring of creatinine improved 0.91 hemoglobin 10.9.  Patient was cleared to begin subcutaneous heparin for DVT prophylaxis.  Tolerating a regular diet.  Therapy evaluations completed and patient was admitted for a comprehensive rehab program.  Please see preadmission assessment from earlier today as well. Patient transferred to CIR on 05/05/2020 .   Patient currently requires min with mobility secondary to muscle weakness, decreased cardiorespiratoy endurance, decreased attention, decreased awareness, decreased safety awareness and decreased memory and decreased standing balance, decreased postural control and decreased balance strategies.  Prior to  hospitalization, patient was independent  with mobility and lived with Spouse, Family, Friend(s) in a House home.  Home access is 4-5Stairs to enter.  Patient will benefit from skilled PT intervention to maximize safe functional mobility, minimize fall risk and  decrease caregiver burden for planned discharge home with intermittent assist.  Anticipate patient will benefit from follow up OP at discharge.  PT - End of Session Activity Tolerance: Tolerates 30+ min activity with multiple rests Endurance Deficit: Yes Endurance Deficit Description: mild SOB with increased intensity in mobility PT Assessment Rehab Potential (ACUTE/IP ONLY): Excellent PT Barriers to Discharge: Behavior;Home environment access/layout PT Patient demonstrates impairments in the following area(s): Balance;Safety;Behavior;Edema;Skin Integrity;Endurance;Motor;Nutrition;Pain PT Transfers Functional Problem(s): Bed Mobility;Bed to Chair;Car;Furniture;Floor PT Locomotion Functional Problem(s): Ambulation;Wheelchair Mobility;Stairs PT Plan PT Intensity: Minimum of 1-2 x/day ,45 to 90 minutes PT Frequency: 5 out of 7 days PT Duration Estimated Length of Stay: 5-7 days PT Treatment/Interventions: Ambulation/gait training;Community reintegration;Neuromuscular re-education;DME/adaptive equipment instruction;Psychosocial support;Stair training;UE/LE Strength taining/ROM;Balance/vestibular training;Discharge planning;Functional electrical stimulation;Pain management;Skin care/wound management;Therapeutic Activities;UE/LE Coordination activities;Cognitive remediation/compensation;Disease management/prevention;Functional mobility training;Patient/family education;Splinting/orthotics;Therapeutic Exercise PT Transfers Anticipated Outcome(s): mod I PT Locomotion Anticipated Outcome(s): mod I 500+ ft PT Recommendation Recommendations for Other Services: Therapeutic Recreation consult Therapeutic Recreation Interventions: Stress management Follow Up Recommendations: Outpatient PT Patient destination: Home Equipment Recommended: To be determined Equipment Details: Will determine DME needs based on patient's progress   PT Evaluation Precautions/Restrictions Precautions Precautions:  Fall Restrictions Weight Bearing Restrictions: No Pain Pain Assessment Pain Scale: 0-10 Pain Score: 0-No pain Home Living/Prior Functioning Home Living Available Help at Discharge: Family;Available 24 hours/day Type of Home: House Home Access: Stairs to enter [employer] of Steps: 4-5 Entrance Stairs-Rails: Right;Left;Can reach both Home Layout: One level Bathroom Shower/Tub: Tub/shower unit;Walk-in shower Bathroom Toilet: Handicapped height Bathroom Accessibility: Yes  Lives With: Spouse;Family;Friend(s) Prior Function Level of Independence: Independent with homemaking with ambulation;Independent with gait;Independent with transfers;Independent with basic ADLs  Able to Take Stairs?: Yes Driving: Yes Vocation: Retired Comments: drives; active; enjoys hiking; has had several falls, walked 4-5 miles/day on unlevel trails by herself Vision/Perception  Vision - Assessment Eye Alignment: Within Functional Limits Ocular Range of Motion: Within Functional Limits Alignment/Gaze Preference: Within Defined Limits Tracking/Visual Pursuits: Able to track stimulus in all quads without difficulty Saccades: Within functional limits Additional Comments: Previous c/o some L inattention/visual deficits, not observed this session Perception Perception: Within Functional Limits Praxis Praxis: Intact  Cognition Overall Cognitive Status: Impaired/Different from baseline Arousal/Alertness: Awake/alert Orientation Level: Oriented X4 Attention: Selective Focused Attention Impairment: Verbal basic Sustained Attention Impairment: Verbal basic Selective Attention: Appears intact Memory: Appears intact Immediate Memory Recall: Sock;Blue;Bed Memory Recall Sock: Without Cue Memory Recall Blue: Without Cue Memory Recall Bed: With Cue Awareness: Impaired Awareness Impairment: Emergent impairment Problem Solving: Impaired Behaviors: Impulsive Safety/Judgment: Impaired Comments: Poor  judgement/self awareness, tangential speech throughout evaluation, required min cues for redirection throughout [employer] Scales of Cognitive Functioning: Purposeful/appropriate Sensation Sensation Light Touch: Appears Intact Hot/Cold: Appears Intact Proprioception: Appears Intact Coordination Gross Motor Movements are Fluid and Coordinated: No Fine Motor Movements are Fluid and Coordinated: Yes Coordination and Movement Description: generalized weakness Finger Nose Finger Test: [facility] Heel Shin Test: WLF Motor  Motor Motor: Other (comment) Motor - Skilled Clinical Observations: generalized weakness and instability   Trunk/Postural Assessment  Cervical Assessment Cervical Assessment: Within Functional Limits Thoracic Assessment Thoracic Assessment: Within Functional Limits Lumbar Assessment Lumbar Assessment: Within Functional Limits Postural Control Postural Control: Deficits on evaluation Righting Reactions: delayed/decreased Protective Responses: delayed/decreased  Balance Balance  Balance Assessed: Yes Static Sitting Balance Static Sitting - Balance Support: No upper extremity supported;Feet supported Static Sitting - Level of Assistance: 7: Independent Dynamic Sitting Balance Dynamic Sitting - Balance Support: No upper extremity supported;Feet supported Dynamic Sitting - Level of Assistance: 7: Independent Static Standing Balance Static Standing - Balance Support: No upper extremity supported;During functional activity Static Standing - Level of Assistance: 5: Stand by assistance Dynamic Standing Balance Dynamic Standing - Balance Support: No upper extremity supported;During functional activity Dynamic Standing - Level of Assistance: 4: Min assist Extremity Assessment  RUE Assessment RUE Assessment: Within Functional Limits (WFL for age) LUE Assessment LUE Assessment: Within Functional Limits (WFL for age) RLE Assessment Active Range of Motion (AROM)  Comments: WFL for all functional mobility General Strength Comments: Grossly 4+ to 5/5 throughout LLE Assessment LLE Assessment: Within Functional Limits Active Range of Motion (AROM) Comments: WFL for all functional mobility General Strength Comments: Grossly 4+ to 5/5 throughout   Outcome Measures: Berg Balance Test Sit to Stand: Able to stand without using hands and stabilize independently Standing Unsupported: Able to stand 2 minutes with supervision Sitting with Back Unsupported but Feet Supported on Floor or Stool: Able to sit safely and securely 2 minutes Stand to Sit: Sits safely with minimal use of hands Transfers: Able to transfer safely, minor use of hands Standing Unsupported with Eyes Closed: Able to stand 10 seconds with supervision Standing Ubsupported with Feet Together: Able to place feet together independently but unable to hold for 30 seconds From Standing, Reach Forward with Outstretched Arm: Can reach forward >12 cm safely (5") From Standing Position, Pick up Object from Floor: Able to pick up shoe, needs supervision From Standing Position, Turn to Look Behind Over each Shoulder: Needs supervision when turning Turn 360 Degrees: Needs assistance while turning Standing Unsupported, Alternately Place Feet on Step/Stool: Able to complete >2 steps/needs minimal assist Standing Unsupported, One Foot in Front: Needs help to step but can hold 15 seconds Standing on One Leg: Able to lift leg independently and hold equal to or more than 3 seconds Total Score: 35/56 Patient demonstrates increased fall risk as noted by score of 35/56 on Berg Balance Scale.  (<36= high risk for falls, close to 100%; 37-45 significant >80%; 46-51 moderate >50%; 52-55 lower >25%)  Five times Sit to Stand Test (FTSS) Method: Use a straight back chair with a solid seat that is 16-18 high. Ask participant to sit on the chair with arms folded across their chest.   Instructions: Stand up and sit down  as quickly as possible 5 times, keeping your arms folded across your chest.   Measurement: Stop timing when the participant stands the 5th time.  TIME: __11.3____ (in seconds)  Times > 13.6 seconds is associated with increased disability and morbidity (Guralnik, 2000) Times > 15 seconds is predictive of recurrent falls in healthy individuals aged 45 and older (Buatois, et al., 2008) Normal performance values in community dwelling individuals aged 65 and older (Bohannon, 2006): o 60-69 years: 11.4 seconds o 70-79 years: 12.6 seconds o 80-89 years: 14.8 seconds  MCID: ? 2.3 seconds for Vestibular Disorders (Meretta, 2006)  6 Min Walk Test:  Instructed patient to ambulate as quickling and as safely as possible for 6 minutes using LRAD. Patient was allowed to take standing rest breaks without stopping the test, but if he required a sitting rest break the clock would be stopped and the test would be over.  Results: 425 feet without an AD, HR 81,  SPO2 100%, RPE 4-5/10 after  Care Tool Care Tool Bed Mobility Roll left and right activity Roll left and right activity did not occur: Safety/medical concerns (HOB elevated at all times per MD orders) Roll left and right assist level: Supervision/Verbal cueing    Sit to lying activity Sit to lying activity did not occur: Safety/medical concerns (HOB elevated at all times per MD orders) Sit to lying assist level: Supervision/Verbal cueing    Lying to sitting edge of bed activity Lying to sitting edge of bed activity did not occur: Safety/medical concerns (HOB elevated at all times per MD orders) Lying to sitting edge of bed assist level: Supervision/Verbal cueing     Care Tool Transfers Sit to stand transfer   Sit to stand assist level: Contact Guard/Touching assist    Chair/bed transfer   Chair/bed transfer assist level: Minimal Assistance - Patient > 75%     Toilet transfer   Assist Level: Minimal Assistance - Patient > 75% Assistive  Device Comment: BSC  Car transfer   Car transfer assist level: Minimal Assistance - Patient > 75%      Care Tool Locomotion Ambulation   Assist level: Minimal Assistance - Patient > 75% Assistive device: No Device Max distance: 425'  Walk 10 feet activity   Assist level: Minimal Assistance - Patient > 75% Assistive device: No Device   Walk 50 feet with 2 turns activity   Assist level: Minimal Assistance - Patient > 75% Assistive device: No Device  Walk 150 feet activity   Assist level: Minimal Assistance - Patient > 75% Assistive device: No Device  Walk 10 feet on uneven surfaces activity   Assist level: Minimal Assistance - Patient > 75%    Stairs   Assist level: Contact Guard/Touching assist Stairs assistive device: 2 hand rails Max number of stairs: 16  Walk up/down 1 step activity   Walk up/down 1 step (curb) assist level: Minimal Assistance - Patient > 75% Walk up/down 1 step or curb assistive device: 1 hand rail    Walk up/down 4 steps activity Walk up/down 4 steps assist level: Contact Guard/Touching assist Walk up/down 4 steps assistive device: 2 hand rails  Walk up/down 12 steps activity   Walk up/down 12 steps assist level: Contact Guard/Touching assist Walk up/down 12 steps assistive device: 2 hand rails  Pick up small objects from floor   Pick up small object from the floor assist level: Supervision/Verbal cueing    Wheelchair Will patient use wheelchair at discharge?: No (patient is a functional ambulator)   Wheelchair activity did not occur: N/A      Wheel 50 feet with 2 turns activity Wheelchair 50 feet with 2 turns activity did not occur: N/A    Wheel 150 feet activity Wheelchair 150 feet activity did not occur: N/A      Refer to Care Plan for Long Term Goals  SHORT TERM GOAL WEEK 1 PT Short Term Goal 1 (Week 1): STG=LTG due to ELOS  Recommendations for other services: Therapeutic Recreation  Stress management  Skilled Therapeutic  Intervention Instructed pt in results of PT evaluation as detailed below, PT POC, rehab potential, rehab goals, and discharge recommendations. Additionally discussed CIR's policies regarding fall safety and use of chair alarm and/or quick release belt. Pt verbalized understanding and in agreement. Will update pt's family members as they become available.   Mobility Bed Mobility Bed Mobility: Supine to Sit;Sit to Supine Supine to Sit: Supervision/Verbal cueing (hospital bed elevated to 30 degrees) Sit  to Supine: Supervision/Verbal cueing (hospital bed elevated to 30 degrees) Transfers Transfers: Sit to Stand;Stand Pivot Transfers;Stand to Sit Sit to Stand: Contact Guard/Touching assist Stand to Sit: Contact Guard/Touching assist Stand Pivot Transfers: Minimal Assistance - Patient > 75%;Contact Guard/Touching assist Stand Pivot Transfer Details: Verbal cues for gait pattern;Verbal cues for safe use of DME/AE;Verbal cues for precautions/safety;Visual cues for safe use of DME/AE Stand Pivot Transfer Details (indicate cue type and reason): provided cues for hand placement on RW, reaching back to sit, and keeping the RW with her rather than pushing it away before sitting for safety Transfer (Assistive device): Rolling walker  Locomotion  Gait Ambulation: Yes Gait Assistance: Minimal Assistance - Patient > 75% Gait Distance (Feet): 55 Feet Assistive device: Rolling walker Gait Assistance Details: Visual cues for safe use of DME/AE;Verbal cues for gait pattern;Verbal cues for safe use of DME/AE;Verbal cues for precautions/safety;Visual cues/gestures for precautions/safety Gait Assistance Details: patient veering back and forth with use of RW, light touch on RW, and pushing RW away while ambulating, required heaving cues for safe use of DME. Overall increased gait deviations with use of RW Gait Gait: Yes Gait Pattern: Decreased stride length;Step-through pattern;Decreased hip/knee flexion -  right;Decreased hip/knee flexion - left;Decreased trunk rotation;Narrow base of support Stairs / Additional Locomotion Curb:  (with use of hand rails)   Discharge Criteria: Patient will be discharged from PT if patient refuses treatment 3 consecutive times without medical reason, if treatment goals not met, if there is a change in medical status, if patient makes no progress towards goals or if patient is discharged from hospital.  The above assessment, treatment plan, treatment alternatives and goals were discussed and mutually agreed upon: by patient  Doreene Burke PT, DPT  05/06/2020, 12:43 PM

## 2020-05-06 NOTE — Evaluation (Addendum)
Speech Language Pathology Assessment and Plan  Patient Details  Name: [name] MRN: [id number] Date of Birth: 21-Mar-1931  SLP Diagnosis: Cognitive Impairments  Rehab Potential: Good ELOS: 5-7 days    Today's Date: 05/06/2020 SLP Individual Time: 1435-1530 SLP Individual Time Calculation (min): 55 min   Hospital Problem: Principal Problem:   TBI (traumatic brain injury) [facility])  Past Medical History:  Past Medical History:  Diagnosis Date  . Autoimmune disease ([place])   . Barrett's esophagus   . Bone lesion    sacrum  . Breast cancer ([place]) 2010   cT1 N0 M0; ER+/ PR+/ HER2 neg; s/p lumpectomy 07/2010; started adjuvant hormonal therapy 10/2009  . Cervical cancer ([place])   . Dysphagia   . Fx distal radius NEC-closed   . Glaucoma   . Humerus fracture 03/2012   impacted left humueral neck fracture, treated by Dr. Amedeo Plenty  . Hyperlipidemia   . Hypertension   . Osteoporosis 03/18/2012  . Pneumonia   . Thyroid disease    Past Surgical History:  Past Surgical History:  Procedure Laterality Date  . ABDOMINAL HYSTERECTOMY  1972   cancer          1 tube 1 ovary  . APPENDECTOMY    . BREAST LUMPECTOMY Left 2010  . colon polyp removal    . EYE SURGERY    . HAND SURGERY    . ortho procedures     multiple  . OVARIAN CYST REMOVAL  1978  . [place]   left    Assessment / Plan / Recommendation Clinical Impression   [name] is an 86 year old right-handed female with history of left breast cancer status post chemotherapy and radiation in 2010, Covid January 2021, hypertension, hypothyroidism, Barrett's esophagus, hyperlipidemia.  History taken from chart review, daughter, and patient.  Patient lives with her husband and independent prior to admission and active.  1 level home 5 steps to entry.  She presented on 04/29/2020 after a fall, ?syncopal event while at church with transient loss of consciousness.  She could not recall the events of the fall.  Cranial CT scan  showed acute intracranial hemorrhage with bilateral frontal subarachnoid hemorrhage and small amount of right occipital subarachnoid hemorrhage.  Possible focus of intraparenchymal hemorrhage in the left frontal and subcortical gray-white matter.  Left frontotemporal periorbital preseptal scalp hematoma as well as nondisplaced superolateral orbital fracture.  CT cervical spine negative.  Admission chemistries unremarkable except glucose 116 creatinine 1.06, hemoglobin 12.2, WBC 7300, SARS coronavirus negative, urinalysis negative nitrite.  Neurosurgery, Dr. Sherley Bounds recommended conservative care with follow-up cranial CT scan on 05/01/2020.  Maintained on Keppra for seizure prophylaxis.  No surgical intervention needed for left lateral orbital fracture per Dr. Melida Quitter.  EEG negative for seizure.  Echocardiogram with ejection fraction of 65%. No wall motion abnormalities.  Carotid Dopplers with no ICA stenosis.  Follow-up chemistries close monitoring of creatinine improved 0.91 hemoglobin 10.9.  Patient was cleared to begin subcutaneous heparin for DVT prophylaxis.  Tolerating a regular diet.  Therapy evaluations completed and patient was admitted for a comprehensive rehab program.  Please see preadmission assessment from earlier today as well.  SLP evaluation completed on 05/06/20 with results as follows: Pt presents with mild higher level cognitive deficits related to executive functioning and emergent/anticipatory awareness.  Pt scored WFL on all subtests of the [employer]ogn[employer]at standardized cognitive assessment with the exception of memory registration and visuospatial constuction subtests.  Memory registration errors could be explained  by pt being slightly hard of hearing and having difficulty differentiating between like sounding words as she was eventually able to recall all 4 words after a delay.  Pt was able to correct errors on visuospatial construction subtests with min assist verbal cues.   Pt also  presents with reports of increased susceptibility to environmental distractions and decreased frustration tolerance s/p TBI. Given the abovementioned deficits, pt would benefit from skilled ST while inpatient in order to maximize functional independence and reduce burden of care prior to discharge.  Anticipate that pt would benefit from at least intermittent supervision for IADLs such as medication and financial management at discharge in addition to [place] follow up.    Skilled Therapeutic Interventions          Cognitive-linguistic evaluation completed with results and recommendations reviewed with family.     SLP Assessment  Patient will need skilled [facility] during CIR admission    Recommendations  Patient destination: Home Follow up Recommendations: Outpatient SLP Equipment Recommended: None recommended by SLP    SLP Frequency 3 to 5 out of 7 days   SLP Duration  SLP Intensity  SLP Treatment/Interventions 5-7 days  Minumum of 1-2 x/day, 30 to 90 minutes  Cognitive remediation/compensation;Cueing hierarchy;Functional tasks;Patient/family education;Environmental controls;Internal/external aids    Pain Pain Assessment Pain Scale: 0-10 Pain Score: 8  Pain Type: Acute pain Pain Location: Back Pain Descriptors / Indicators: Aching Pain Onset: With Activity Pain Intervention(s): RN made aware  Prior Functioning Cognitive/Linguistic Baseline: Within functional limits Type of Home: House  Lives With: Spouse Available Help at Discharge: Family;Available 24 hours/day Vocation: Retired  Programmer, systems Overall Cognitive Status: Impaired/Different from baseline Arousal/Alertness: Awake/alert Orientation Level: Oriented X4 Attention: Selective Selective Attention: Appears intact Memory: Appears intact Awareness: Impaired Awareness Impairment: Anticipatory impairment Problem Solving: Impaired Problem Solving Impairment: Functional  complex Executive Function: Self Monitoring;Self Correcting Self Monitoring: Impaired Self Monitoring Impairment: Functional complex Self Correcting: Impaired Self Correcting Impairment: Functional complex Behaviors: Impulsive Safety/Judgment: Impaired [employer] Scales of Cognitive Functioning: Purposeful/appropriate  Comprehension Auditory Comprehension Overall Auditory Comprehension: Appears within functional limits for tasks assessed Expression Expression Primary Mode of Expression: Verbal Verbal Expression Overall Verbal Expression: Appears within functional limits for tasks assessed Oral Motor Oral Motor/Sensory Function Overall Oral Motor/Sensory Function: Within functional limits Motor Speech Overall Motor Speech: Appears within functional limits for tasks assessed  Care Tool Care Tool Cognition Expression of Ideas and Wants Expression of Ideas and Wants: Without difficulty (complex and basic) - expresses complex messages without difficulty and with speech that is clear and easy to understand   Understanding Verbal and Non-Verbal Content Understanding Verbal and Non-Verbal Content: Understands (complex and basic) - clear comprehension without cues or repetitions   Memory/Recall Ability *first 3 days only Memory/Recall Ability *first 3 days only: Current season;That he or she is in a hospital/hospital unit;Staff names and faces     Short Term Goals: Week 1: SLP Short Term Goal 1 (Week 1): STG=LTG due to ELOS  Refer to Care Plan for Long Term Goals  Recommendations for other services: None   Discharge Criteria: Patient will be discharged from SLP if patient refuses treatment 3 consecutive times without medical reason, if treatment goals not met, if there is a change in medical status, if patient makes no progress towards goals or if patient is discharged from hospital.  The above assessment, treatment plan, treatment alternatives and goals were discussed and  mutually agreed upon: by patient  Araseli Sherry,  Selinda Orion 05/06/2020, 4:23 PM

## 2020-05-07 ENCOUNTER — Inpatient Hospital Stay (HOSPITAL_COMMUNITY): Payer: Medicare Other

## 2020-05-07 ENCOUNTER — Inpatient Hospital Stay (HOSPITAL_COMMUNITY): Payer: Medicare Other | Admitting: Speech Pathology

## 2020-05-07 LAB — COMPREHENSIVE METABOLIC PANEL
ALT: 51 U/L — ABNORMAL HIGH (ref 0–44)
AST: 44 U/L — ABNORMAL HIGH (ref 15–41)
Albumin: 2.4 g/dL — ABNORMAL LOW (ref 3.5–5.0)
Alkaline Phosphatase: 171 U/L — ABNORMAL HIGH (ref 38–126)
Anion gap: 5 (ref 5–15)
BUN: 21 mg/dL (ref 8–23)
CO2: 29 mmol/L (ref 22–32)
Calcium: 8.7 mg/dL — ABNORMAL LOW (ref 8.9–10.3)
Chloride: 99 mmol/L (ref 98–111)
Creatinine, Ser: 1.16 mg/dL — ABNORMAL HIGH (ref 0.44–1.00)
GFR calc Af Amer: 49 mL/min — ABNORMAL LOW (ref >60)
GFR calc non Af Amer: 42 mL/min — ABNORMAL LOW (ref >60)
Glucose, Bld: 96 mg/dL (ref 70–99)
Potassium: 4.4 mmol/L (ref 3.5–5.1)
Sodium: 133 mmol/L — ABNORMAL LOW (ref 135–145)
Total Bilirubin: 0.6 mg/dL (ref 0.3–1.2)
Total Protein: 5.4 g/dL — ABNORMAL LOW (ref 6.5–8.1)

## 2020-05-07 LAB — CBC WITH DIFFERENTIAL/PLATELET
Abs Immature Granulocytes: 0.07 10*3/uL (ref 0.00–0.07)
Basophils Absolute: 0 10*3/uL (ref 0.0–0.1)
Basophils Relative: 0 %
Eosinophils Absolute: 0.2 10*3/uL (ref 0.0–0.5)
Eosinophils Relative: 3 %
HCT: 29.6 % — ABNORMAL LOW (ref 36.0–46.0)
Hemoglobin: 9.5 g/dL — ABNORMAL LOW (ref 12.0–15.0)
Immature Granulocytes: 1 %
Lymphocytes Relative: 21 %
Lymphs Abs: 1.7 10*3/uL (ref 0.7–4.0)
MCH: 30 pg (ref 26.0–34.0)
MCHC: 32.1 g/dL (ref 30.0–36.0)
MCV: 93.4 fL (ref 80.0–100.0)
Monocytes Absolute: 0.9 10*3/uL (ref 0.1–1.0)
Monocytes Relative: 11 %
Neutro Abs: 5.2 10*3/uL (ref 1.7–7.7)
Neutrophils Relative %: 64 %
Platelets: 229 10*3/uL (ref 150–400)
RBC: 3.17 MIL/uL — ABNORMAL LOW (ref 3.87–5.11)
RDW: 15.4 % (ref 11.5–15.5)
WBC: 8.1 10*3/uL (ref 4.0–10.5)
nRBC: 0 % (ref 0.0–0.2)

## 2020-05-07 MED ORDER — SORBITOL 70 % SOLN
30.0000 mL | Freq: Once | Status: AC
  Administered 2020-05-07: 30 mL via ORAL
  Filled 2020-05-07: qty 30

## 2020-05-07 NOTE — Progress Notes (Signed)
Inpatient Rehabilitation  Patient information reviewed and entered into eRehab system by Zeb Rawl M. Eletha Culbertson, M.A., CCC/SLP, PPS Coordinator.  Information including medical coding, functional ability and quality indicators will be reviewed and updated through discharge.    

## 2020-05-07 NOTE — Care Management (Signed)
[facility] Individual Statement of Services  Patient Name:  [name]  Date:  05/07/2020  Welcome to the [facility].  Our goal is to provide you with an individualized program based on your diagnosis and situation, designed to meet your specific needs.  With this comprehensive rehabilitation program, you will be expected to participate in at least 3 hours of rehabilitation therapies Monday-Friday, with modified therapy programming on the weekends.  Your rehabilitation program will include the following services:  Physical Therapy (PT), Occupational Therapy (OT), Speech Therapy (ST), 24 hour per day rehabilitation nursing, Therapeutic Recreaction (TR), Psychology, Neuropsychology, Care Coordinator, Rehabilitation Medicine, Nutrition Services, Pharmacy Services and Other  Weekly team conferences will be held on Tuesdays to discuss your progress.  Your Inpatient Rehabilitation Care Coordinator will talk with you frequently to get your input and to update you on team discussions.  Team conferences with you and your family in attendance may also be held.  Expected length of stay: 5-7 days   Overall anticipated outcome: Supervision  Depending on your progress and recovery, your program may change. Your Inpatient Rehabilitation Care Coordinator will coordinate services and will keep you informed of any changes. Your Inpatient Rehabilitation Care Coordinator's name and contact numbers are listed  below.  The following services may also be recommended but are not provided by the [facility] will be made to provide these services after discharge if needed.  Arrangements include referral to agencies that provide these services.  Your insurance has been verified to be:  [employer]  Your primary doctor  is:  Janett Billow Copland  Pertinent information will be shared with your doctor and your insurance company.  Inpatient Rehabilitation Care Coordinator:  Cathleen Corti [phone] or [phone]  Information discussed with and copy given to patient by: Rana Snare, 05/07/2020, 8:28 AM

## 2020-05-07 NOTE — Progress Notes (Signed)
Occupational Therapy Session Note  Patient Details  Name: [name] MRN: [id number] Date of Birth: Jun 28, 1931  Today's Date: 05/07/2020 OT Individual Time: 8366-2947 OT Individual Time Calculation (min): 60 min   Session 2: OT Individual Time: 1140-1210 OT Individual Time Calculation (min): 30 min    Short Term Goals: Week 1:  OT Short Term Goal 1 (Week 1): STG=LTG d/t ELOS  Skilled Therapeutic Interventions/Progress Updates:    Pt received supine, alert and oriented. Pt with 5/10 HA, reporting high back pain and HA yesterday after therapy sessions as well. RN notified of pt tylenol request. MD entered room and joined discussion re back pain. Pt requesting to complete ADLs at shower level. Pt transferred out of bed with min HHA. Pt stood at the sink to complete oral care with very close (S). Pt quickly transferred to nearby [facility] and voided urine. Pt completed ambulatory transfer into the shower with CGA. She doffed all clothing seated with good safety awareness. Pt completed all bathing sit <> stand with set up assist, (S) with use of grab bars and TTB. RN entered during shower and administered pain medication. Pt donned nightgown with set up assist while seated. Recommended pt to try sitting up in recliner vs going back to bed for back pain, pt agreeable. Pt set up with several pillow and chair pad alarm. Coffee warmed up for pt and sign placed outside door to ensure carryover of staff bringing warm liquids 2/2 pt request for throat pain.   Session 2: Pt received sitting up in the recliner with daughter and husband present. Family education completed throughout session with focus on TBI education, especially safety awareness deficits in pt. Pt completed 200 ft of functional mobility to and from room with CGA, no AD. Cueing provided for pacing. Pt completed dynamic standing balance activity with stepping to demonstrate to pt and family current balance deficits. Min A overall required for  balance. Also explained to pt berg balance score completed with PT. Also discussed limits of ability with pt and family, including not driving. Pt was left in her recliner with all needs met, chair alarm set.    Therapy Documentation Precautions:  Precautions Precautions: Fall Restrictions Weight Bearing Restrictions: No   Therapy/Group: Individual Therapy  Curtis Sites 05/07/2020, 6:53 AM

## 2020-05-07 NOTE — Plan of Care (Signed)
  Problem: Consults Goal: RH BRAIN INJURY PATIENT EDUCATION Description: Description: See Patient Education module for eduction specifics Outcome: Progressing   Problem: RH SKIN INTEGRITY Goal: RH STG MAINTAIN SKIN INTEGRITY WITH ASSISTANCE Description: STG Maintain Skin Integrity With mod I Assistance. Outcome: Progressing   Problem: RH COGNITION-NURSING Goal: RH STG ANTICIPATES NEEDS/CALLS FOR ASSIST W/ASSIST/CUES Description: STG Anticipates Needs/Calls for Assist With mod I Assistance/Cues. Outcome: Progressing   Problem: RH PAIN MANAGEMENT Goal: RH STG PAIN MANAGED AT OR BELOW PT'S PAIN GOAL Description: Less than 5 out of 10 Outcome: Progressing   Problem: RH KNOWLEDGE DEFICIT BRAIN INJURY Goal: RH STG INCREASE KNOWLEDGE OF SELF CARE AFTER BRAIN INJURY Description: Increase knowledge of self care and medication regimen through education from staff with [place] assist.  Outcome: Progressing

## 2020-05-07 NOTE — Progress Notes (Signed)
PMR Admission Coordinator Pre-Admission Assessment   Patient: [name] is an 86 y.o., female MRN: [id number] DOB: [birth year] Height: '5\' 2"'$  (157.5 cm) Weight: 51.2 kg                                                                                                                                                  Insurance Information HMO:     PPO: yes     PCP:      IPA:      80/20:      OTHER:  PRIMARY: BCBS Blue MCR      Policy#: [id number]      Subscriber: pt CM Name: Larene Beach      Phone#: [phone]     Fax#: [phone] Pre-Cert#: [id number]  (final auth number to be provided on admission); updates due to fax listed above 7 days from admit    Employer:  Benefits:  Phone #: [phone]     Name:  Eff. Date: 10/14/2019     Deduct: $0      Out of Pocket Max: $5900 ($140 met)      Life Max: n/a  CIR: $335/day for days 1-6      SNF: 20 full days Outpatient:      Co-Pay: $40/visit (waived during covid-19 pandemic) Home Health: 100%      Co-Pay:  DME: 80%     Co-Pay: 20% Providers: preferred network   SECONDARY:       Policy#:       Phone#:    Development worker, community:       Phone#:    The Engineer, petroleum" for patients in Inpatient Rehabilitation Facilities with attached "Privacy Act Gantt Records" was provided and verbally reviewed with: Patient and Family   Emergency Contact Information         Contact Information     Name Relation Home Work Mobile    [place] Daughter     [phone]    Deeann Cree Daughter [phone]   [phone]       Current Medical History  Patient Admitting Diagnosis: bilateral traumatic SAH's   History of Present Illness: [name] is a 86 y.o. right-handed female with history of left breast cancer( status post chemo and radiation therapy in 2010), Covid-19 positive in January 2021, hypertension, hypothyroidism, hyperlipidemia. Presented 04/29/2020  after a fall while at church with transient loss of consciousness.  She could not recall the events of the fall.  Cranial CT scan showed acute intracranial hemorrhage with bilateral frontal subarachnoid hemorrhage and small amount of right occipital subarachnoid hemorrhage.  Possible focus of intraparenchymal hemorrhage in the left frontal subcortical gray-white matter.  Left frontotemporal and periorbital preseptal scalp hematoma as well as nondisplaced superolateral orbital fracture.  CT cervical spine negative.  Admission chemistries unremarkable except glucose 116 and creatinine 1.06, WBC 11,300, SARS coronavirus negative.  Neurosurgery consult with Dr. Sherley Bounds, who recommended conservative care with follow-up cranial CT scan 05/01/2020 unchanged.  Maintained on Keppra for seizure prophylaxis.  No surgical intervention needed for left lateral orbital fracture per Dr. Melida Quitter.  Tolerating regular diet.  Therapy evaluations completed with recommendations of physical medicine rehab consult.      Past Medical History      Past Medical History:  Diagnosis Date  . Autoimmune disease ([place])    . Barrett's esophagus    . Bone lesion      sacrum  . Breast cancer ([place]) 2010    cT1 N0 M0; ER+/ PR+/ HER2 neg; s/p lumpectomy 07/2010; started adjuvant hormonal therapy 10/2009  . Cervical cancer ([place])    . Dysphagia    . Fx distal radius NEC-closed    . Glaucoma    . Humerus fracture 03/2012    impacted left humueral neck fracture, treated by Dr. Amedeo Plenty  . Hyperlipidemia    . Hypertension    . Osteoporosis 03/18/2012  . Pneumonia    . Thyroid disease        Family History  family history includes Breast cancer in her paternal aunt; Cancer in her brother, brother, paternal aunt, and sister; Heart disease in her father.   Prior Rehab/Hospitalizations:  Has the patient had prior rehab or hospitalizations prior to admission? No   Has the patient had major surgery during 100 days prior to  admission? No   Current Medications    Current Facility-Administered Medications:  .  acetaminophen (TYLENOL) tablet 1,000 mg, 1,000 mg, Oral, Q6H, Norm Parcel, PA-C, 1,000 mg at 05/04/20 0717 .  amLODipine (NORVASC) tablet 2.5 mg, 2.5 mg, Oral, Daily, Dahal, Binaya, MD, 2.5 mg at 05/04/20 1007 .  bisacodyl (DULCOLAX) suppository 10 mg, 10 mg, Rectal, Daily PRN, Meuth, Brooke A, PA-C .  Chlorhexidine Gluconate Cloth 2 % PADS 6 each, 6 each, Topical, Daily, Norm Parcel, PA-C, 6 each at 05/04/20 1007 .  docusate sodium (COLACE) capsule 100 mg, 100 mg, Oral, BID, Norm Parcel, PA-C, 100 mg at 05/04/20 1007 .  feeding supplement (ENSURE ENLIVE) (ENSURE ENLIVE) liquid 237 mL, 237 mL, Oral, BID BM, Norm Parcel, PA-C, 237 mL at 05/04/20 1008 .  heparin injection 5,000 Units, 5,000 Units, Subcutaneous, Q8H, Norm Parcel, PA-C, 5,000 Units at 05/04/20 0602 .  hydrALAZINE (APRESOLINE) injection 10 mg, 10 mg, Intravenous, Q2H PRN, Norm Parcel, PA-C .  latanoprost (XALATAN) 0.005 % ophthalmic solution 1 drop, 1 drop, Both Eyes, QHS, Johnson, [employer], PA-C, 1 drop at 05/03/20 2222 .  levETIRAcetam (KEPPRA) tablet 500 mg, 500 mg, Oral, BID, Norm Parcel, PA-C, 500 mg at 05/04/20 1008 .  levothyroxine (SYNTHROID) tablet 50 mcg, 50 mcg, Oral, Q0600, Norm Parcel, PA-C, 50 mcg at 05/04/20 0602 .  methocarbamol (ROBAXIN) tablet 500 mg, 500 mg, Oral, Q6H, Norm Parcel, PA-C, 500 mg at 05/04/20 1008 .  multivitamin with minerals tablet 1 tablet, 1 tablet, Oral, Daily, Norm Parcel, PA-C, 1 tablet at 05/04/20 1008 .  ondansetron (ZOFRAN-ODT) disintegrating tablet 4 mg, 4 mg, Oral, Q6H PRN **OR** ondansetron (ZOFRAN) injection 4 mg, 4 mg, Intravenous, Q6H PRN, Norm Parcel, PA-C, 4 mg at 04/29/20 1803 .  oxyCODONE (Oxy IR/ROXICODONE) immediate release tablet 2.5-5 mg, 2.5-5 mg, Oral, Q4H PRN, Norm Parcel, PA-C, 2.5 mg at 05/03/20 1230 .  [START  ON 05/05/2020]  polyethylene glycol (MIRALAX / GLYCOLAX) packet 17 g, 17 g, Oral, Daily, Meuth, Brooke A, PA-C .  rosuvastatin (CRESTOR) tablet 10 mg, 10 mg, Oral, Daily, Norm Parcel, PA-C, 10 mg at 05/04/20 1008   Patients Current Diet:     Diet Order                      Diet Heart Room service appropriate? Yes with Assist; Fluid consistency: Thin  Diet effective now                      Precautions / Restrictions Precautions Precautions: Fall Restrictions Weight Bearing Restrictions: No    Has the patient had 2 or more falls or a fall with injury in the past year? Yes, the fall that led to this admission   Prior Activity Level Community (5-7x/wk): family reports very active, still driving, walking 5 miles per day   Prior Functional Level Prior Function Level of Independence: Independent Comments: drives; active; enjoys hhiking; has had several falls   Self Care: Did the patient need help bathing, dressing, using the toilet or eating?  Independent   Indoor Mobility: Did the patient need assistance with walking from room to room (with or without device)? Independent   Stairs: Did the patient need assistance with internal or external stairs (with or without device)? Independent   Functional Cognition: Did the patient need help planning regular tasks such as shopping or remembering to take medications? Independent   Home Assistive Devices / Equipment Home Equipment: Shower seat - built in, Grab bars - tub/shower   Prior Device Use: Indicate devices/aids used by the patient prior to current illness, exacerbation or injury? None of the above   Current Functional Level Cognition   Arousal/Alertness: Awake/alert Overall Cognitive Status: Impaired/Different from baseline Current Attention Level: Sustained Orientation Level: Oriented X4 Safety/Judgement: Decreased awareness of safety General Comments: pt more sad today stating "oh i feel horrible, I was doing better  yesterday" Attention: Sustained Sustained Attention: Impaired Sustained Attention Impairment: Verbal complex Memory: Impaired Memory Impairment: Decreased recall of new information, Decreased short term memory Decreased Short Term Memory: Verbal basic Awareness: Impaired Awareness Impairment: Intellectual impairment, Emergent impairment, Anticipatory impairment Problem Solving: Impaired Problem Solving Impairment: Verbal complex    Extremity Assessment (includes Sensation/Coordination)   Upper Extremity Assessment: Generalized weakness  Lower Extremity Assessment: Generalized weakness     ADLs   Overall ADL's : Needs assistance/impaired Eating/Feeding: Set up, Sitting Grooming: Wash/dry hands, Wash/dry face, Oral care, Brushing hair, Min guard, Standing Grooming Details (indicate cue type and reason): washing hair with shampoo cap (set-up/S) while seated Upper Body Bathing: Set up, Supervision/ safety, Sitting Lower Body Bathing: Minimal assistance, Sit to/from stand Upper Body Dressing : Set up, Supervision/safety, Sitting Lower Body Dressing: Minimal assistance, Sit to/from stand Toilet Transfer: Minimal assistance, Ambulation, Comfort height toilet, Grab bars Toilet Transfer Details (indicate cue type and reason): no AD Toileting- Clothing Manipulation and Hygiene: Min guard, Sit to/from stand Functional mobility during ADLs: Moderate assistance (HHA; may do well with RW) General ADL Comments: impulsive at times; LOB during dynamic tasks     Mobility   Overal bed mobility: Needs Assistance Bed Mobility: Sidelying to Sit, Rolling Rolling: Min guard Sidelying to sit: Min assist Supine to sit: Min guard Sit to supine: Mod assist General bed mobility comments: Recevied pt from PT as they came back from ambulating     Transfers   Overall transfer  level: Needs assistance Equipment used: None Transfers: Sit to/from Stand Sit to Stand: Min guard Stand pivot transfers: Mod  assist General transfer comment: pt requires minG due to posterior lean upon standing     Ambulation / Gait / Stairs / Wheelchair Mobility   Ambulation/Gait Ambulation/Gait assistance: Herbalist (Feet): 200 Feet Assistive device: None Gait Pattern/deviations: Step-through pattern, Drifts right/left General Gait Details: pt consistently drifting toward L side, PT providing tactile cues and physical assist to pull back toward middle however requires verbal cues to maintain midline with ambulation Gait velocity: functional Gait velocity interpretation: 1.31 - 2.62 ft/sec, indicative of limited community ambulator Stairs: Yes Stairs assistance: Min assist Stair Management: One rail Right Number of Stairs: 4     Posture / Balance Dynamic Sitting Balance Sitting balance - Comments: minA at end of session Balance Overall balance assessment: Needs assistance Sitting-balance support: Feet supported Sitting balance-Leahy Scale: Good Sitting balance - Comments: minA at end of session Standing balance support: No upper extremity supported, During functional activity Standing balance-Leahy Scale: Fair Standing balance comment: standing at sink to wash hands, wash face, brush hair     Special needs/care consideration Skin bil racoon's eyes, laceration from L temple to cheek, Diabetic management no and Designated visitor spouse [name]) and daughter [name])        Previous Home Environment (from acute therapy documentation) Living Arrangements: Spouse/significant other Available Help at Discharge: Family, Available 24 hours/day Type of Home: House Home Layout: One level Home Access: Stairs to enter Entrance Stairs-Rails: Right, Left, Can reach both Entrance Stairs-Number of Steps: 4-5 Bathroom Shower/Tub: Tub/shower unit, Multimedia programmer: Handicapped height Bathroom Accessibility: Yes How Accessible: Accessible via walker   Discharge Living Setting Plans for  Discharge Living Setting: Patient's home Type of Home at Discharge: House Discharge Home Layout: One level Discharge Home Access: Stairs to enter Entrance Stairs-Rails: Can reach both, Left, Right Entrance Stairs-Number of Steps: 5 Discharge Bathroom Shower/Tub: Tub/shower unit, Walk-in shower Discharge Bathroom Toilet: Handicapped height Discharge Bathroom Accessibility: Yes How Accessible: Accessible via walker Does the patient have any problems obtaining your medications?: No   Social/Family/Support Systems Patient Roles: Spouse Contact Information: Cera Rorke [phone]) Anticipated Caregiver: spouse and daughters [name] and [place]) Anticipated Caregiver's Contact Information: [name] [phone] Ability/Limitations of Caregiver: [name] could potentially have some memory deficits of his own (based on observation)  Caregiver Availability: 24/7 Discharge Plan Discussed with Primary Caregiver: Yes Is Caregiver In Agreement with Plan?: Yes Does Caregiver/Family have Issues with Lodging/Transportation while Pt is in Rehab?: No     Goals Patient/Family Goal for Rehab: PT/OT supervision to mod I, SLP supervision Expected length of stay: 7-10 Additional Information: Designated visitors: spouse and [name] are designated visitors Pt/Family Agrees to Admission and willing to participate: Yes Program Orientation Provided & Reviewed with Pt/Caregiver Including Roles  & Responsibilities: Yes  Barriers to Discharge: Insurance for SNF coverage     Decrease burden of Care through IP rehab admission: n/a   Possible need for SNF placement upon discharge: Not anticipated   Patient Condition: This patient's medical and functional status has changed since the consult dated: 7/20 in which the Rehabilitation Physician determined and documented that the patient's condition is appropriate for intensive rehabilitative care in an inpatient rehabilitation facility. See "History of  Present Illness" (above) for medical update. Functional changes are: min/mod assist 200'. Patient's medical and functional status update has been discussed with the Rehabilitation physician and patient remains appropriate for inpatient rehabilitation.  Will admit to inpatient rehab today.   Preadmission Screen Completed By:  Michel Santee, PT, DPT 05/04/2020 10:51 AM ______________________________________________________________________   Discussed status with Dr. Posey Pronto on 05/04/20 at 05/04/20 and received approval for admission today.   Admission Coordinator:  Michel Santee, PT, DPT time 10:51 AM Sudie Grumbling 05/04/20

## 2020-05-07 NOTE — Progress Notes (Signed)
Physical Therapy Session Note  Patient Details  Name: [name] MRN: [id number] Date of Birth: 1931/01/11  Today's Date: 05/07/2020 PT Individual Time: 1345-1430 PT Individual Time Calculation (min): 45 min   Short Term Goals: Week 1:  PT Short Term Goal 1 (Week 1): STG=LTG due to ELOS  Skilled Therapeutic Interventions/Progress Updates:     Patient in recliner in the room upon PT arrival. Patient alert and agreeable to PT session. Patient reported 10/10 thoracic back pain intermittently with mobility during session, RN made aware. PT provided repositioning, rest breaks, and distraction as pain interventions throughout session. Patient reported that the pain had just started after ambulating to the bathroom with nursing staff just prior to PT session.   Performed general assessment of thoracic mobility, mild limitations with thoracic flexion ROM, unable to attain neutral with thoracic extension ROM, and limited thoracic rotation ROM. No change in pain with ROM testing or over pressure. Also reports no change with repeated motions, however patient unable to tolerate >5 reps of thoracic flexion or extension. No change with palpation over spinus process, facets, or transverse processed of thoracic or lumbar spine, noted increased muscle tension in thoracic and lumbar erector spinae muscles on palpation. Patient with improved pain with ambulation and in standing and increased pain with pushing or lifting with UEs. Worst pain with bed mobility with muscle spasms and patient crying out in pain and requiring cues for diaphragmatic breathing ~2 min before pain seemed to ease off. RN and PA made aware of assessment, RN bringing patient pain medication at end of session.    Therapeutic Activity: Bed Mobility: Patient performed sit to supine with min A for LE management and rolling due to increased back pain with mobility. Provided verbal cues for performing log roll technique to reduce back pain with  mobility. Transfers: Patient performed sit to/from stand x4 with CGA-min A with HHA for safety/balance.   Gait Training:  Patient ambulated 91 feet and 255 feet using HHA with min a-CGA with 1 LOB resulting in patient crossing R foot over her L as stepping strategy to self-correct. Ambulated with decreased gait speed, decreased step length and height, increased B hip and knee flexion in stance, forward trunk lean, narrow BOS, and downward head gaze. Provided verbal cues for erect posture as tolerated, increased BOS, and increased step height for safety. Patient reported mild improvement in back pain during both ambulation trials.   Patient in bed at end of session with breaks locked, bed alarm set, and all needs within reach.    Therapy Documentation Precautions:  Precautions Precautions: Fall Restrictions Weight Bearing Restrictions: No   Therapy/Group: Individual Therapy  Jayin Derousse L Paiden Caraveo PT, DPT  05/07/2020, 5:13 PM

## 2020-05-07 NOTE — Progress Notes (Signed)
Speech Language Pathology Daily Session Note  Patient Details  Name: MARGARIE MCGUIRT MRN: [id number] Date of Birth: [birth year]  Today's Date: 05/07/2020 SLP Individual Time: 1001-1045 SLP Individual Time Calculation (min): 44 min  Short Term Goals: Week 1: SLP Short Term Goal 1 (Week 1): STG=LTG due to ELOS  Skilled Therapeutic Interventions: Pt was seen for skilled ST targeting cognition. PT completed basic medication management task from the ALFA Mod I. She required Supervision A verbal cues for a basic money management task using cash/coins, as well as for problem solving and error awareness during a semi-complex checkbook balancing activity. Pt was somewhat perseverative on explaining strategies/systems she uses at home to manage finances, requiring cues for mental flexibility and to accept rationale behind completing similar tasks in therapy today targeting same skills. She also required Min A for organization within tasks. Pt left sitting in chair with alarm set and needs within reach. Continue per current plan of care.          Pain Pain Assessment Pain Scale: 0-10 Pain Score: 0-No pain  Therapy/Group: Individual Therapy  Arbutus Leas 05/07/2020, 7:23 AM

## 2020-05-07 NOTE — Progress Notes (Signed)
[facility] PHYSICAL MEDICINE & REHABILITATION PROGRESS NOTE  Subjective/Complaints:  Pt reports having acute back pain which is new- was hurting, then stopped- clenched up again- very tight- yesterday - is on Robaxin 500 mg q6 hours, but she forgot and has oxycodone q4 hours prn- which she forgot this AM.   She thinks her LBM was 1+ week ago- except for small BM last night, cannot find one documented since 7/19- which was 1 weeks ago. Feels constipated.  Slept well.  ROS:  Pt denies SOB, abd pain, CP, N/V/C/D, and vision changes   Objective: Vital Signs: Blood pressure 114/67, pulse 71, temperature 97.9 F (36.6 C), temperature source Oral, resp. rate 18, height 5\' 2"  (1.575 m), weight 51.3 kg, SpO2 100 %. No results found. Recent Labs    05/05/20 1643 05/07/20 0508  WBC 9.7 8.1  HGB 11.1* 9.5*  HCT 34.2* 29.6*  PLT 253 229   Recent Labs    05/05/20 0552 05/05/20 0552 05/05/20 1643 05/07/20 0508  NA 135  --   --  133*  K 4.3  --   --  4.4  CL 101  --   --  99  CO2 24  --   --  29  GLUCOSE 124*  --   --  96  BUN 14  --   --  21  CREATININE 0.92   < > 0.84 1.16*  CALCIUM 9.1  --   --  8.7*   < > = values in this interval not displayed.    Physical Exam: BP 114/67 (BP Location: Right Arm)    Pulse 71    Temp 97.9 F (36.6 C) (Oral)    Resp 18    Ht 5\' 2"  (1.575 m)    Wt 51.3 kg    SpO2 100%    BMI 20.69 kg/m  Constitutional: No distress . Vital signs reviewed.  Thin. Sitting up in bed- working with OT, nAD HENT: Normocephalic.  Bilateral periorbital ecchymosis- no change Eyes: EOMI. No discharge. Cardiovascular:RRR Respiratory: CTA B/L- no W/R/R- good air movement GI: soft, slightly distended? NT, hypoactive BS Skin: See above.  Intact.  Warm and dry. Psych: poor  Memory- c/p back pain Musc: No edema in extremities.  No tenderness in extremities. Neuro: Alert, appropriate Makes eye contact with examiner.   She follows simple commands.   Motor: Grossly 5/5  throughout, unchanged  Assessment/Plan: 1. Functional deficits secondary to TBI which require 3+ hours per day of interdisciplinary therapy in a comprehensive inpatient rehab setting.  Physiatrist is providing close team supervision and 24 hour management of active medical problems listed below.  Physiatrist and rehab team continue to assess barriers to discharge/monitor patient progress toward functional and medical goals  Care Tool:  Bathing    Body parts bathed by patient: Right arm, Left arm, Chest, Abdomen, Front perineal area, Buttocks, Right upper leg, Left upper leg, Right lower leg, Left lower leg, Face         Bathing assist Assist Level: Minimal Assistance - Patient > 75%     Upper Body Dressing/Undressing Upper body dressing   What is the patient wearing?: Hospital gown only    Upper body assist Assist Level: Minimal Assistance - Patient > 75%    Lower Body Dressing/Undressing Lower body dressing      What is the patient wearing?: Pants     Lower body assist Assist for lower body dressing: Minimal Assistance - Patient > 75%     Toileting  Toileting    Toileting assist Assist for toileting: Contact Guard/Touching assist     Transfers Chair/bed transfer  Transfers assist     Chair/bed transfer assist level: Minimal Assistance - Patient > 75%     Locomotion Ambulation   Ambulation assist      Assist level: Minimal Assistance - Patient > 75% Assistive device: No Device Max distance: 425'   Walk 10 feet activity   Assist     Assist level: Minimal Assistance - Patient > 75% Assistive device: No Device   Walk 50 feet activity   Assist    Assist level: Minimal Assistance - Patient > 75% Assistive device: No Device    Walk 150 feet activity   Assist    Assist level: Minimal Assistance - Patient > 75% Assistive device: No Device    Walk 10 feet on uneven surface  activity   Assist     Assist level: Minimal Assistance -  Patient > 75%     Wheelchair     Assist Will patient use wheelchair at discharge?: No (patient is a functional ambulator)   Wheelchair activity did not occur: N/A         Wheelchair 50 feet with 2 turns activity    Assist    Wheelchair 50 feet with 2 turns activity did not occur: N/A       Wheelchair 150 feet activity     Assist Wheelchair 150 feet activity did not occur: N/A          Medical Problem List and Plan: 1.  Decreased functional mobility secondary to traumatic bilateral frontal SAH with small amount of right occipital subarachnoid hemorrhage as well as nondisplaced superolateral orbital fracture  Begin CIR evaluations 2.  Antithrombotics: -DVT/anticoagulation: Subcutaneous heparin             -antiplatelet therapy: N/A 3. Pain Management: Robaxin 500 mg every 6 hours and oxycodone as needed  7/26- pt needs to take oxycodone as needed- has Rx's for pain  Monitor with increased exertion 4. Mood: Patient on Celexa prior to admission.             -antipsychotic agents: N/A 5. Neuropsych: This patient is capable of making decisions on her own behalf. 6. Skin/Wound Care: Routine skin checks 7. Fluids/Electrolytes/Nutrition: Routine in and outs. 8.  Seizure prophylaxis.    Keppra 500 mg twice daily x 7 days.  EEG negative 9.  Hypertension.  Norvasc 2.5 mg daily.               7/26- BP well controlled- con't meds  10.  Question syncope.  Carotid Dopplers negative.  Echocardiogram unremarkable.  Monitor with increased mobility 11.  Hypothyroidism.  TSH 4.079.    Continue Synthroid 12.  Hyperlipidemia.  Continue Crestor. 13. CKD stage III.  Latest creatinine 1.03.               Creatinine 0.92 on 7/24, CMP ordered 14.  Acute blood loss anemia             Hemoglobin 11.1 on 7/24, CBC ordered 15. Constipation  7/26- only 1 small BM in last 7 days- will order sorbitol and prn enema if needed   LOS: 2 days A FACE TO FACE EVALUATION WAS  PERFORMED  Marc Sivertsen 05/07/2020, 4:21 PM

## 2020-05-07 NOTE — Progress Notes (Signed)
Physical Medicine and Rehabilitation Consult Reason for Consult: Decreased functional ability after a fall Referring Physician: Trauma     HPI: [name] is a 86 y.o. right-handed female with history of left breast cancer status post chemo and radiation therapy in 2010, Covid January 2021, hypertension, hypothyroidism, hyperlipidemia.  Per chart review she lives with her husband and independent prior to admission and active.  1 level home 5 steps to entry.  Presented 04/29/2020 after a fall while at church with transient loss of consciousness.  She could not recall the events of the fall.  Cranial CT scan showed acute intracranial hemorrhage with bilateral frontal subarachnoid hemorrhage and small amount of right occipital subarachnoid hemorrhage.  Possible focus of intraparenchymal hemorrhage in the left frontal subcortical gray-white matter.  Left frontotemporal and periorbital preseptal scalp hematoma as well as nondisplaced superolateral orbital fracture.  CT cervical spine negative.  Admission chemistries unremarkable except glucose 116 and creatinine 1.06, WBC 11,300, SARS coronavirus negative.  Neurosurgery follow-up Dr. Sherley Bounds conservative care with follow-up cranial CT scan 05/01/2020 unchanged.  Maintained on Keppra for seizure prophylaxis.  No surgical intervention needed for left lateral orbital fracture per Dr. Melida Quitter.  Tolerating regular diet.  Therapy evaluations completed with recommendations of physical medicine rehab consult.     Pt reports LBM yesterday- is voiding on her own- walking to bathroom per pt.  Said feels better-but has "ways to go".  Per OT, needs visual cues to attend to L visual field.  She said she hasn't walked yet, with therapy. Says had headache, neck pain and backache this AM- was pretty bad- resolved for now with meds.    Review of Systems  Constitutional: Negative for chills and fever.  HENT: Negative for hearing loss.   Eyes:  Negative for blurred vision and double vision.  Respiratory: Negative for cough and shortness of breath.   Cardiovascular: Negative for chest pain and leg swelling.  Gastrointestinal: Positive for constipation. Negative for heartburn, nausea and vomiting.  Genitourinary: Negative for flank pain.  Musculoskeletal: Positive for myalgias.  Skin: Negative for rash.  All other systems reviewed and are negative.       Past Medical History:  Diagnosis Date  . Autoimmune disease ([place])    . Barrett's esophagus    . Bone lesion      sacrum  . Breast cancer ([place]) 2010    cT1 N0 M0; ER+/ PR+/ HER2 neg; s/p lumpectomy 07/2010; started adjuvant hormonal therapy 10/2009  . Cervical cancer ([place])    . Dysphagia    . Fx distal radius NEC-closed    . Glaucoma    . Humerus fracture 03/2012    impacted left humueral neck fracture, treated by Dr. Amedeo Plenty  . Hyperlipidemia    . Hypertension    . Osteoporosis 03/18/2012  . Pneumonia    . Thyroid disease           Past Surgical History:  Procedure Laterality Date  . ABDOMINAL HYSTERECTOMY   1972    cancer          1 tube 1 ovary  . APPENDECTOMY      . BREAST LUMPECTOMY Left 2010  . colon polyp removal      . EYE SURGERY      . HAND SURGERY      . ortho procedures        multiple  . OVARIAN CYST REMOVAL   1978  .  SHOULDER SURGERY   1954    left         Family History  Problem Relation Age of Onset  . Heart disease Father    . Cancer Sister          lung, kidney  . Cancer Brother          brain  . Cancer Paternal Aunt          breast  . Breast cancer Paternal Aunt    . Cancer Brother          liver, lung  . Osteoporosis Neg Hx      Social History:  reports that she quit smoking about 33 years ago. She has never used smokeless tobacco. She reports current alcohol use. She reports that she does not use drugs. Allergies:       Allergies  Allergen Reactions  . Daypro [Oxaprozin] Swelling      Face and tongue  . Prolia [Denosumab]         unknown          Medications Prior to Admission  Medication Sig Dispense Refill  . Calcium Carbonate-Vitamin D (CALCIUM + D PO) Take 1 capsule by mouth daily.       Marland Kitchen latanoprost (XALATAN) 0.005 % ophthalmic solution Place 1 drop into both eyes at bedtime.       Marland Kitchen levothyroxine (SYNTHROID) 50 MCG tablet TAKE 1 TABLET BY MOUTH DAILY BEFORE BREAKFAST (Patient taking differently: Take 50 mcg by mouth daily. ) 90 tablet 3  . Multiple Vitamin (MULTIVITAMIN WITH MINERALS) TABS tablet Take 1 tablet by mouth daily.      . rosuvastatin (CRESTOR) 10 MG tablet Take 1 tablet (10 mg total) by mouth daily. TAKE 1 BY MOUTH DAILY 90 tablet 1  . verapamil (CALAN-SR) 120 MG CR tablet Take 1 tablet (120 mg total) by mouth daily. 90 tablet 2  . citalopram (CELEXA) 10 MG tablet Take 1 tablet (10 mg total) by mouth daily. (Patient not taking: Reported on 04/29/2020) 90 tablet 3      Home: [place] expects to be discharged to:: Private residence Living Arrangements: Spouse/significant other Available Help at Discharge: Family, Available 24 hours/day Type of Home: House Home Access: Stairs to enter [employer] of Steps: 4-5 Entrance Stairs-Rails: Right, Left, Can reach both Home Layout: One level Bathroom Shower/Tub: Tub/shower unit, Multimedia programmer: Handicapped height Bathroom Accessibility: Yes Home Equipment: Shower seat - built in, [employer] - tub/shower  Functional History: Prior Function Level of Independence: Independent Comments: drives; active; enjoys hhiking; has had several falls Functional Status:  Mobility: Bed Mobility Overal bed mobility: Needs Assistance Bed Mobility: Supine to Sit Supine to sit: Min guard General bed mobility comments: moving impulsively; initially unsteady Transfers Overall transfer level: Needs assistance Transfers: Sit to/from Stand, Stand Pivot Transfers Sit to Stand: Min assist Stand pivot transfers: Min  assist General transfer comment: mod A with mobility at times due to LOB   ADL: ADL Overall ADL's : Needs assistance/impaired Eating/Feeding: Set up, Sitting Grooming: Minimal assistance, Standing Grooming Details (indicate cue type and reason): vc to locate items at times during ADL; pt cued to use washcloth to wash face instead of splashing water on face with hands; Pts states "I would but I don't have a washcloth" - pt's hand was on the washcloth; cues to turn off water; cues ot decrease volumne of water from water being on high and splashing all over sink Upper Body  Bathing: Set up, Supervision/ safety, Sitting Lower Body Bathing: Minimal assistance, Sit to/from stand Upper Body Dressing : Set up, Supervision/safety, Sitting Lower Body Dressing: Minimal assistance, Sit to/from stand Toilet Transfer: Moderate assistance, Ambulation Toilet Transfer Details (indicate cue type and reason): Began with HHA min A however due to LOB x 3, pt required Mod A Toileting- Clothing Manipulation and Hygiene: Min guard, Sit to/from stand Functional mobility during ADLs: Moderate assistance (HHA; may do well with RW) General ADL Comments: impulsive at times; LOB during dynamic tasks   Cognition: Cognition Overall Cognitive Status: Impaired/Different from baseline Orientation Level: Oriented X4 Cognition Arousal/Alertness: Awake/alert Behavior During Therapy: Impulsive, Anxious Overall Cognitive Status: Impaired/Different from baseline Area of Impairment: Attention, Memory, Safety/judgement, Awareness, Problem solving Current Attention Level: Sustained Memory: Decreased recall of precautions, Decreased short-term memory (decreased delayed recall - able to recall 1/3 words ) Safety/Judgement: Decreased awareness of safety, Decreased awareness of deficits Awareness: Emergent Problem Solving: Slow processing General Comments: Pt most likely impulsive at [place] perconservation wtih daughter; cognition  appears worse than baseline; pt educated to not stand without assist, pt stood impulsively; tangential at times; LOB x 3 in bathroom during ADL, pt with decreased awareness of impact of balance deficits on safety at times   Blood pressure (!) 151/77, pulse 63, temperature 98.2 F (36.8 C), temperature source Oral, resp. rate (!) 23, height '5\' 2"'$  (1.575 m), weight 51.2 kg, SpO2 97 %. Physical Exam Vitals and nursing note reviewed.  Constitutional:      Comments: Pt is elderly female- with white hair sitting up in bedside chair, on monitor, eating lunch, appropriate, but tangential, needs VC's to redirect, NAD  HENT:     Head:     Comments: Periorbital swelling and bruising Raccoon eyes B/L Also has L temple to cheek laceration- 3+ inches long- closed    Nose: Nose normal. No congestion.     Mouth/Throat:     Mouth: Mucous membranes are moist.     Pharynx: Oropharynx is clear. No oropharyngeal exudate.  Eyes:     Extraocular Movements: Extraocular movements intact.  Neck:     Comments: Neck sore with mild decrease in cervical ROM Cardiovascular:     Comments: RRR_ no M/R/G Pulmonary:     Comments: CTA B/L- no W/R/R- good air movement Abdominal:     Comments: Soft, NT, ND, (+)BS   Musculoskeletal:     Comments: Strength tested in deltoids, biceps, triceps, WE, grip and finger abd as well as HF, KE, KF, DF and PF-  Strength was 5/5 in UEs and LEs B/L, but needed reminders to test the L side.   Skin:    Comments: Bruising/raccoon eyes on face as detailed above  Neurological:     Comments: Patient is alert no acute distress.  Makes eye contact with examiner.  She follows simple commands.  Provides her name age and date of birth.  She was able to recall bits and pieces of her fall but overall cooperative with exam.  Tangential and needed redirection a few times.  Noted no decrease in sensation in face or in extremities x4 to light touch.  Got sleepy and started closing eyes and less  talkative latter half of interview  Psychiatric:     Comments: Appropriate, slightly flat, groggy        Lab Results Last 24 Hours  No results found for this or any previous visit (from the past 24 hour(s)).    Imaging Results (Last 48 hours)  CT  HEAD WO CONTRAST   Result Date: 04/30/2020 CLINICAL DATA:  Follow-up intracranial hemorrhage.  Trauma. EXAM: CT HEAD WITHOUT CONTRAST TECHNIQUE: Contiguous axial images were obtained from the base of the skull through the vertex without intravenous contrast. COMPARISON:  04/29/2020 FINDINGS: Brain: Small to moderate volume acute subarachnoid hemorrhage, greatest over the frontal convexities, demonstrates slight interval redistribution but has not significantly changed in overall amount. No definite intraparenchymal or subdural hemorrhage is identified. No acute infarct or midline shift is evident. There is mild cerebral atrophy. Hypodensities in the cerebral white matter bilaterally are unchanged and nonspecific but compatible with chronic small vessel ischemic disease which is mild for age. A 7 mm calcified mass along the tentorium on the left is unchanged and consistent with a small meningioma without significant associated mass effect or brain edema. Vascular: Calcified atherosclerosis at the skull base. No hyperdense vessel. Skull: No depressed skull fracture. Sinuses/Orbits: Bilateral sphenoid and left ethmoid sinus fluid levels. Clear mastoid air cells. Bilateral cataract extraction. Mildly decreased left periorbital soft tissue swelling. Other: None. IMPRESSION: 1. Unchanged subarachnoid hemorrhage. 2. No new intracranial abnormality. Electronically Signed   By: Logan Bores M.D.   On: 04/30/2020 05:49    CT Head Wo Contrast   Result Date: 04/29/2020 CLINICAL DATA:  Poly trauma. Loss of consciousness in confusion. EXAM: CT HEAD WITHOUT CONTRAST TECHNIQUE: Contiguous axial images were obtained from the base of the skull through the vertex without  intravenous contrast. COMPARISON:  Feb 22, 2019 FINDINGS: Brain: There is no evidence of acute infarction. There is an acute intracranial hemorrhage with bilateral frontal subarachnoid hemorrhage and small amount of right occipital subarachnoid hemorrhage. There is a focus of possible intraparenchymal hemorrhage in the left frontal subcortical gray matter. No evidence of midline shift or herniation. Moderate brain parenchymal volume loss and deep white matter microangiopathy. Vascular: Calcific atherosclerotic disease of the intra cavernous carotid arteries. Skull: Nondisplaced superolateral orbital fracture. Other: Left frontotemporal and periorbital preseptal scalp hematoma. IMPRESSION: 1. Acute intracranial hemorrhage with bilateral frontal subarachnoid hemorrhage and small amount of right occipital subarachnoid hemorrhage. 2. Possible focus of intraparenchymal hemorrhage in the left frontal subcortical gray matter. 3. Left frontotemporal and periorbital preseptal scalp hematoma. 4. Nondisplaced superolateral orbital fracture. Electronically Signed   By: Fidela Salisbury M.D.   On: 04/29/2020 11:53    CT Cervical Spine Wo Contrast   Result Date: 04/29/2020 CLINICAL DATA:  Status post fall. EXAM: CT CERVICAL SPINE WITHOUT CONTRAST TECHNIQUE: Multidetector CT imaging of the cervical spine was performed without intravenous contrast. Multiplanar CT image reconstructions were also generated. COMPARISON:  Cervical spine radiograph and MRI from 2008, report only. FINDINGS: Alignment: Normal. Skull base and vertebrae: No acute fracture. No primary bone lesion or focal pathologic process. Soft tissues and spinal canal: No prevertebral fluid or swelling. No visible canal hematoma. Disc levels: Multilevel osteoarthritic changes of the cervical spine. Upper chest: Biapical pleural calcifications. IMPRESSION: 1. No evidence of acute traumatic injury to the cervical spine. 2. Multilevel osteoarthritic changes of the  cervical spine. Electronically Signed   By: Fidela Salisbury M.D.   On: 04/29/2020 12:11    EEG adult   Result Date: 04/30/2020 Lora Havens, MD     04/30/2020 11:00 AM Patient Name: KAVITA BARTL MRN: [id number] Epilepsy Attending: Lora Havens Referring Physician/Provider: Dr Karmen Bongo Date: 04/30/2020 Duration: 24.55 mins Patient history: 86yo F with repeated falls and now with [facility]. EEG to evaluate for seizure. Level of alertness: Awake, drowsy AEDs during EEG  study: LEV Technical aspects: This EEG study was done with scalp electrodes positioned according to the 10-20 International system of electrode placement. Electrical activity was acquired at a sampling rate of '500Hz'$  and reviewed with a high frequency filter of '70Hz'$  and a low frequency filter of '1Hz'$ . EEG data were recorded continuously and digitally stored. Description: The posterior dominant rhythm consists of 9 Hz activity of moderate voltage (25-35 uV) seen predominantly in posterior head regions, symmetric and reactive to eye opening and eye closing. Drowsiness was characterized by attenuation of the posterior background rhythm. Physiology photic driving was seen during photic stimulation.  Hyperventilation was not performed.   IMPRESSION: This study is within normal limits. No seizures or epileptiform discharges were seen throughout the recording. Lora Havens    ECHOCARDIOGRAM COMPLETE   Result Date: 04/30/2020    ECHOCARDIOGRAM REPORT   Patient Name:   [name] Date of Exam: 04/30/2020 Medical Rec #:  [id number]     Height:       62.0 in Accession #:    [id number]    Weight:       112.9 lb Date of Birth:  1931-06-07     BSA:          1.499 m Patient Age:    27 years      BP:           129/61 mmHg Patient Gender: F             HR:           61 bpm. Exam Location:  Inpatient Procedure: 2D Echo, Color Doppler and Cardiac Doppler Indications:    R55 Syncope  History:        Patient has prior history of Echocardiogram  examinations, most                 recent 07/22/2019. Risk Factors:Hypertension and Dyslipidemia.                 COVID+ in January 2021.  Sonographer:    Raquel Sarna Senior RDCS Referring Phys: [place]  1. Left ventricular ejection fraction, by estimation, is 60 to 65%. The left ventricle has normal function. The left ventricle has no regional wall motion abnormalities. Left ventricular diastolic parameters are indeterminate.  2. Right ventricular systolic function is normal. The right ventricular size is normal. There is mildly elevated pulmonary artery systolic pressure.  3. Right atrial size was moderately dilated.  4. The pericardial effusion is circumferential.  5. The mitral valve is normal in structure. Mild mitral valve regurgitation. No evidence of mitral stenosis.  6. The aortic valve is tricuspid. Aortic valve regurgitation is not visualized. Mild aortic valve sclerosis is present, with no evidence of aortic valve stenosis.  7. The inferior vena cava is normal in size with <50% respiratory variability, suggesting right atrial pressure of 8 mmHg. Comparison(s): No significant change from prior study. Conclusion(s)/Recommendation(s): Normal biventricular function without evidence of hemodynamically significant valvular heart disease. FINDINGS  Left Ventricle: Left ventricular ejection fraction, by estimation, is 60 to 65%. The left ventricle has normal function. The left ventricle has no regional wall motion abnormalities. The left ventricular internal cavity size was normal in size. There is  no left ventricular hypertrophy. Left ventricular diastolic parameters are indeterminate. Right Ventricle: The right ventricular size is normal. No increase in right ventricular wall thickness. Right ventricular systolic function is normal. There is mildly elevated pulmonary artery systolic pressure. The tricuspid regurgitant velocity  is 2.96  m/s, and with an assumed right atrial pressure of 8 mmHg,  the estimated right ventricular systolic pressure is 29.9 mmHg. Left Atrium: Left atrial size was normal in size. Right Atrium: Right atrial size was moderately dilated. Pericardium: Mix of small amount of echolucent (fluid) and medium echogenicity adjacent to epicardium. Likely pericardial fat pad, but cannot exclude other etiologies based on images. A small pericardial effusion is present. The pericardial effusion is circumferential. Mitral Valve: The mitral valve is normal in structure. Mild mitral valve regurgitation. No evidence of mitral valve stenosis. Tricuspid Valve: The tricuspid valve is normal in structure. Tricuspid valve regurgitation is mild . No evidence of tricuspid stenosis. Aortic Valve: The aortic valve is tricuspid. Aortic valve regurgitation is not visualized. Mild aortic valve sclerosis is present, with no evidence of aortic valve stenosis. There is mild calcification of the aortic valve. Pulmonic Valve: The pulmonic valve was not well visualized. Pulmonic valve regurgitation is trivial. No evidence of pulmonic stenosis. Aorta: The aortic root and ascending aorta are structurally normal, with no evidence of dilitation. Venous: The inferior vena cava is normal in size with less than 50% respiratory variability, suggesting right atrial pressure of 8 mmHg. IAS/Shunts: The atrial septum is grossly normal.  LEFT VENTRICLE PLAX 2D LVIDd:         4.10 cm  Diastology LVIDs:         2.20 cm  LV e' lateral:   7.40 cm/s LV PW:         0.80 cm  LV E/e' lateral: 11.5 LV IVS:        0.90 cm  LV e' medial:    5.77 cm/s LVOT diam:     2.00 cm  LV E/e' medial:  14.7 LV SV:         67 LV SV Index:   45 LVOT Area:     3.14 cm  RIGHT VENTRICLE RV S prime:     11.40 cm/s TAPSE (M-mode): 1.9 cm LEFT ATRIUM           Index       RIGHT ATRIUM           Index LA diam:      3.10 cm 2.07 cm/m  RA Area:     17.50 cm LA Vol (A2C): 29.1 ml 19.41 ml/m RA Volume:   45.90 ml  30.62 ml/m LA Vol (A4C): 30.8 ml 20.55 ml/m   AORTIC VALVE LVOT Vmax:   88.80 cm/s LVOT Vmean:  70.000 cm/s LVOT VTI:    0.214 m  AORTA Ao Root diam: 2.80 cm Ao Asc diam:  2.90 cm MITRAL VALVE               TRICUSPID VALVE MV Area (PHT): 3.72 cm    TR Peak grad:   35.0 mmHg MV Decel Time: 204 msec    TR Vmax:        296.00 cm/s MV E velocity: 84.80 cm/s MV A velocity: 90.00 cm/s  SHUNTS MV E/A ratio:  0.94        Systemic VTI:  0.21 m                            Systemic Diam: 2.00 cm Buford Dresser MD Electronically signed by Buford Dresser MD Signature Date/Time: 04/30/2020/1:05:33 PM    Final     CT Maxillofacial Wo Contrast   Result Date: 04/29/2020 CLINICAL DATA:  Status post  fall with facial trauma. EXAM: CT MAXILLOFACIAL WITHOUT CONTRAST TECHNIQUE: Multidetector CT imaging of the maxillofacial structures was performed. Multiplanar CT image reconstructions were also generated. COMPARISON:  None. FINDINGS: Osseous: No mandibular fracture or dislocation. No destructive process. Possible small nondisplaced fracture of the superolateral left orbital wall. Orbits: Moderate left periorbital preseptal hematoma. The orbital contents are intact. Sinuses: Partial opacification of the bilateral ethmoid sinuses. Small air-fluid level in the left maxillary sinus. Soft tissues: Left periorbital scalp hematoma. IMPRESSION: 1. Possible small nondisplaced fracture of the superolateral left orbital wall. 2. Moderate left periorbital preseptal hematoma. The orbital contents are intact. 3. Partial opacification of the bilateral ethmoid sinuses. Small air-fluid level in the left maxillary sinus. Electronically Signed   By: Fidela Salisbury M.D.   On: 04/29/2020 12:07         Assessment/Plan: Diagnosis: syncope with B/L traumatic SAH's with impaired function and mildly impaired cognition- od note, said hadn't walked with therapy- did 1 hr prior. 1. Does the need for close, 24 hr/day medical supervision in concert with the patient's rehab needs make it  unreasonable for this patient to be served in a less intensive setting? Yes 2. Co-Morbidities requiring supervision/potential complications: HTN, HLD, L orbital fx, dysphagia, barret's esopahgus 3. Due to bladder management, bowel management, safety, skin/wound care, disease management, medication administration, pain management and patient education, does the patient require 24 hr/day rehab nursing? Yes 4. Does the patient require coordinated care of a physician, rehab nurse, therapy disciplines of PT, OT and SLP to address physical and functional deficits in the context of the above medical diagnosis(es)? Yes Addressing deficits in the following areas: balance, endurance, locomotion, strength, transferring, bathing, dressing, feeding, grooming, toileting and cognition 5. Can the patient actively participate in an intensive therapy program of at least 3 hrs of therapy per day at least 5 days per week? Yes 6. The potential for patient to make measurable gains while on inpatient rehab is good 7. Anticipated functional outcomes upon discharge from inpatient rehab are modified independent and supervision  with PT, modified independent and supervision with OT, modified independent and supervision with SLP. 8. Estimated rehab length of stay to reach the above functional goals is: 7-10 days 9. Anticipated discharge destination: Home 10. Overall Rehab/Functional Prognosis: excellent   RECOMMENDATIONS: This patient's condition is appropriate for continued rehabilitative care in the following setting: CIR Patient has agreed to participate in recommended program. Potentially Note that insurance prior authorization may be required for reimbursement for recommended care.   Comment:  1. Pt having some memory issues- couldnt' remember had walked with PT, even with cues- and saw them 1 hr prior- is also tangential and needs redirections frequently.  2. Would benefit from short stay in CIR to work on PT, OT and  SLP- was walking 5 miles/day and driving prior to admission.  3. Will submit for insurance with admission coordinators assistance 4. Thank you for this consult.    Lavon Paganini Angiulli, PA-C 05/01/2020

## 2020-05-08 ENCOUNTER — Inpatient Hospital Stay (HOSPITAL_COMMUNITY): Payer: Medicare Other

## 2020-05-08 ENCOUNTER — Inpatient Hospital Stay (HOSPITAL_COMMUNITY): Payer: Medicare Other | Admitting: Speech Pathology

## 2020-05-08 ENCOUNTER — Inpatient Hospital Stay (HOSPITAL_COMMUNITY): Payer: Medicare Other | Admitting: Physical Therapy

## 2020-05-08 LAB — CBC WITH DIFFERENTIAL/PLATELET
Abs Immature Granulocytes: 0.15 10*3/uL — ABNORMAL HIGH (ref 0.00–0.07)
Basophils Absolute: 0 10*3/uL (ref 0.0–0.1)
Basophils Relative: 0 %
Eosinophils Absolute: 0.1 10*3/uL (ref 0.0–0.5)
Eosinophils Relative: 1 %
HCT: 33.6 % — ABNORMAL LOW (ref 36.0–46.0)
Hemoglobin: 10.9 g/dL — ABNORMAL LOW (ref 12.0–15.0)
Immature Granulocytes: 2 %
Lymphocytes Relative: 14 %
Lymphs Abs: 1.4 10*3/uL (ref 0.7–4.0)
MCH: 30.2 pg (ref 26.0–34.0)
MCHC: 32.4 g/dL (ref 30.0–36.0)
MCV: 93.1 fL (ref 80.0–100.0)
Monocytes Absolute: 1.1 10*3/uL — ABNORMAL HIGH (ref 0.1–1.0)
Monocytes Relative: 10 %
Neutro Abs: 7.3 10*3/uL (ref 1.7–7.7)
Neutrophils Relative %: 73 %
Platelets: 308 10*3/uL (ref 150–400)
RBC: 3.61 MIL/uL — ABNORMAL LOW (ref 3.87–5.11)
RDW: 15.4 % (ref 11.5–15.5)
WBC: 10.1 10*3/uL (ref 4.0–10.5)
nRBC: 0 % (ref 0.0–0.2)

## 2020-05-08 LAB — IRON AND TIBC
Iron: 53 ug/dL (ref 28–170)
Saturation Ratios: 23 % (ref 10.4–31.8)
TIBC: 232 ug/dL — ABNORMAL LOW (ref 250–450)
UIBC: 179 ug/dL

## 2020-05-08 LAB — BASIC METABOLIC PANEL
Anion gap: 11 (ref 5–15)
BUN: 16 mg/dL (ref 8–23)
CO2: 27 mmol/L (ref 22–32)
Calcium: 9.1 mg/dL (ref 8.9–10.3)
Chloride: 92 mmol/L — ABNORMAL LOW (ref 98–111)
Creatinine, Ser: 0.96 mg/dL (ref 0.44–1.00)
GFR calc Af Amer: >60 mL/min (ref >60)
GFR calc non Af Amer: 53 mL/min — ABNORMAL LOW (ref >60)
Glucose, Bld: 128 mg/dL — ABNORMAL HIGH (ref 70–99)
Potassium: 4 mmol/L (ref 3.5–5.1)
Sodium: 130 mmol/L — ABNORMAL LOW (ref 135–145)

## 2020-05-08 NOTE — Progress Notes (Signed)
Speech Language Pathology Daily Session Note  Patient Details  Name: [name] MRN: [id number] Date of Birth: [birth year]  Today's Date: 05/08/2020 SLP Individual Time: 4536-4680 SLP Individual Time Calculation (min): 40 min  Short Term Goals: Week 1: SLP Short Term Goal 1 (Week 1): STG=LTG due to ELOS  Skilled Therapeutic Interventions: Pt was seen for skilled ST targeting cognitive goals. Pt's husband also present and receptive to education opportunities throughout session. Pt recalled familiar and current medications with Supervision A verbal cues. She demonstrated ability to organize 1X daily pills (familiar task) within pill box Mod I, but Min-Mod A cues required for problem solving and error awareness when learning how to organize a QID medication within pill box. Pt continues to require considerate amount of cues for mental flexibility and rationale behind participation in therapy activities/new learning. Made explicit recommendation for husband to fully assist with medication administration, and he was in agreement. Although pt was resistant to recommendation, she stated she would allow family to assist with meds and work toward independence with task as able. Pt left sitting in chair with alarm set and needs within reach, husband and social worker present. Continue per current plan of care.          Pain Pain Assessment Pain Scale: 0-10 Pain Score: 0-No pain  Therapy/Group: Individual Therapy  Arbutus Leas 05/08/2020, 7:09 AM

## 2020-05-08 NOTE — Progress Notes (Signed)
Physical Therapy Session Note  Patient Details  Name: [name] MRN: [id number] Date of Birth: [birth year]  Today's Date: 05/08/2020 PT Individual Time: 0903-0930 PT Individual Time Calculation (min): 27 min  Missing treatment time : 33 minutes d/t dizziness, unable to participate.  Short Term Goals: Week 1:  PT Short Term Goal 1 (Week 1): STG=LTG due to ELOS  Skilled Therapeutic Interventions/Progress Updates: Pt presents sitting in recliner and agreeable to therapy.  Pt states vomiting this AM, but given Zofran and Ginger Ale and feeling good.  RN requests "taking it easy".  Pt performs 3 x15 ther ex to LEs for calf raises, LAQ, hip flexion and abd/add.  Pt required extended seated rests, but agreeable to improve mobility.  Pt donned 1 shoe and then became dizzy performing 2nd.  BP taken and 150/72.  MD in room and assessed.  RN called.  BP taken again at 166/78.  Cold cloth for head and neck but still c/o dizziness.  Offered transfer BTB, but wishes to remain in recliner, LEs elevated.  RN came into room and discussed findings.  RN will reassess.  Pt remained in recliner per wishes.  33 minutes of therapy missed.      Therapy Documentation Precautions:  Precautions Precautions: Fall Restrictions Weight Bearing Restrictions: No General:   Vital Signs:   Pain:no c/o pain, nausea and vomiting earlier.  Pt w/ c/o dizziness during rx, nursing aware and will reassess. Pain Assessment Pain Scale: 0-10 Pain Score: 0-No pain :      Therapy/Group: Individual Therapy  [name] 05/08/2020, 9:59 AM

## 2020-05-08 NOTE — Patient Care Conference (Signed)
Inpatient RehabilitationTeam Conference and Plan of Care Update Date: 05/08/2020   Time: 10:06 AM   Patient Name: [name]      Medical Record Number: [id number]  Date of Birth: 1931-04-12 Sex: Female         Room/Bed: 4M03C/4M03C-01 Payor Info: Payor: Horace / Plan: BCBS MEDICARE / Product Type: *No Product type* /    Admit Date/Time:  05/05/2020  2:45 PM  Primary Diagnosis:  TBI (traumatic brain injury) [facility])  Hospital Problems: Principal Problem:   TBI (traumatic brain injury) [facility])    Expected Discharge Date: Expected Discharge Date: 05/11/20  Team Members Present: Physician leading conference: Dr. Leeroy Cha Care Coodinator Present: Loralee Pacas, LCSWA;Charleen Madera Creig Hines, RN, BSN, CRRN Nurse Present: Serena Croissant, LPN PT Present: Apolinar Junes, PT OT Present: Laverle Hobby, OT SLP Present: Jettie Booze, CF-SLP PPS Coordinator present : Ileana Ladd, Burna Mortimer, SLP     Current Status/Progress Goal Weekly Team Focus  Bowel/Bladder   Patient is continent of bowel and bladder.  Last void prior to bedtime 05/07/20 and LBM 05/06/20.  Patient will maintain bowel and bladder continence and regular patterns during IP rehab stay.  Qshift and PRN intake and output monitoring.  Encourage balanced nutrition and hydration   Swallow/Nutrition/ Hydration             ADL's   CGA with bathing and dressing overall. Poor safety awareness/insight into deficits. CGA- min guard ADL transfers  mod I, (S) cognitive goals  Self awareness/safety awareness, ADLs, ADL transfers, d/c planning   Mobility   Supervision bed mobility and transfers, CGA-min A gait >400 ft and 16 steps with B rails  Supervision-mod I overall  Balance, functional mobiltiy, gait and stair training, activity tolerance, back pain management, patient/caregiver education, d/c planning   Communication             Safety/Cognition/ Behavioral Observations  Supervision-Min  Supervision   organization, complex problem solving and recall, mental flexibility, education   Pain   Patient's pain is well controlled with current regimen.  Scheduled Robaxin 500 mg Q6H.  PRN Tylenol 650 mg Q6H and Oxycodone 2.5-5 mg Q4H.  Patient will maintain pain level less than 4 with and without increased activity.  Qshift and PRN pain assessment.  Encourage non-pharmacological pain management interventions   Skin   Patient's skin is intact with facial and abdominal bruising.  Patient will remain free of infection and/or breakdown during IP rehab stay  Qshift and PRN skin assessment.  Strict perineal and skin care.  Moisturizers and barrier creams as needed. Encourage nutrition with protein intake     Team Discussion:  Discharge Planning/Teaching Needs:  D/c to home with her husband who will provide 24/7 care. Intermittent support from both of her dtrs: [name] and [place] education as recommended. Fam edu scheduled for Wed 1pm-3pm with husband and dtr [name]; ?Thurs 1pm-3pm with husband and dtr [name].   Current Update:  None  Current Barriers to Discharge:  Poor safety awareness, and resistant to change.  Possible Resolutions to Barriers: Educate patient and family the importance of slowing down and things can't be done like they were before due to increased risk of falling.  Patient on target to meet rehab goals: yes, Family is appropriate and has been through family education. Patient progressing towards discharge goals.  *See Care Plan and progress notes for long and short-term goals.   Revisions to Treatment Plan:  None    Medical Summary  Current Status: Continent of bowel and bladder, nausea this morning, hypertensive, dizzy when bending down to tie her shoes, back pain has resolved. Weekly Focus/Goal: Zofran as needed for nasuea, continue to monitro BP TID, repeat creatinine and hemoglobin with iron today, monitor back pain  Barriers to Discharge: Medication  compliance;Decreased family/caregiver support  Barriers to Discharge Comments: Husband is elderly and with memory deficits, medically with continued nausea, elevated creatinine, hemoglobin drop by 2 points over 2 days. Possible Resolutions to Barriers: 24/7 supervision from husband with support from daughters, zofran prn for nausea, repeat labs today, will add IV fluids if creatinine is trending upward   Continued Need for Acute Rehabilitation Level of Care: The patient requires daily medical management by a physician with specialized training in physical medicine and rehabilitation for the following reasons: Direction of a multidisciplinary physical rehabilitation program to maximize functional independence : Yes Medical management of patient stability for increased activity during participation in an intensive rehabilitation regime.: Yes Analysis of laboratory values and/or radiology reports with any subsequent need for medication adjustment and/or medical intervention. : Yes   I attest that I was present, lead the team conference, and concur with the assessment and plan of the team.   Cristi Loron 05/08/2020, 1:05 PM
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
## 2020-05-08 NOTE — IPOC Note (Signed)
Overall Plan of Care [facility]) Patient Details Name: [name] MRN: [id number] DOB: [birth year]  Admitting Diagnosis: TBI (traumatic brain injury) [facility])  Hospital Problems: Principal Problem:   TBI (traumatic brain injury) ([place])     Functional Problem List: Nursing Bowel, Pain, Skin Integrity  PT Balance, Safety, Behavior, Edema, Skin Integrity, Endurance, Motor, Nutrition, Pain  OT Balance, Endurance, Motor, Safety  SLP Cognition  TR         Basic ADL's: OT Bathing, Dressing, Toileting     Advanced  ADL's: OT       Transfers: PT Bed Mobility, Bed to Chair, Car, Furniture, Floor  OT Toilet, Tub/Shower     Locomotion: PT Ambulation, Emergency planning/management officer, Stairs     Additional Impairments: OT None  SLP Social Cognition   Problem Solving, Awareness  TR      Anticipated Outcomes Item Anticipated Outcome  Self Feeding no goal set  Swallowing      Basic self-care  mod I  Toileting  mod I   Bathroom Transfers mod I  Bowel/Bladder  mod I   Transfers  mod I  Locomotion  mod I 500+ ft  Communication     Cognition  supervision  Pain  less than 5 out of 10  Safety/Judgment  mod I   Therapy Plan: PT Intensity: Minimum of 1-2 x/day ,45 to 90 minutes PT Frequency: 5 out of 7 days PT Duration Estimated Length of Stay: 5-7 days OT Intensity: Minimum of 1-2 x/day, 45 to 90 minutes OT Frequency: 5 out of 7 days OT Duration/Estimated Length of Stay: 5-7 days SLP Intensity: Minumum of 1-2 x/day, 30 to 90 minutes SLP Frequency: 3 to 5 out of 7 days SLP Duration/Estimated Length of Stay: 5-7 days   Due to the current state of emergency, patients may not be receiving their 3-hours of Medicare-mandated therapy.   Team Interventions: Nursing Interventions Patient/Family Education, Bowel Management, Pain Management, Medication Management  PT interventions Ambulation/gait training, Community reintegration, Neuromuscular re-education, DME/adaptive equipment  instruction, Psychosocial support, Stair training, UE/LE Strength taining/ROM, Training and development officer, Discharge planning, Functional electrical stimulation, Pain management, Skin care/wound management, Therapeutic Activities, UE/LE Coordination activities, Cognitive remediation/compensation, Disease management/prevention, Functional mobility training, Patient/family education, Splinting/orthotics, Therapeutic Exercise  OT Interventions Balance/vestibular training, DME/adaptive equipment instruction, Patient/family education, Therapeutic Activities, Therapeutic Exercise, Community reintegration, Functional mobility training, Self Care/advanced ADL retraining, UE/LE Strength taining/ROM, Discharge planning, UE/LE Coordination activities  SLP Interventions Cognitive remediation/compensation, Cueing hierarchy, Functional tasks, Patient/family education, Environmental controls, Internal/external aids  TR Interventions    SW/CM Interventions Discharge Planning, Psychosocial Support, Patient/Family Education   Barriers to Discharge MD  Medical stability  Nursing      PT Behavior, Home environment access/layout    OT      SLP      SW       Team Discharge Planning: Destination: PT-Home ,OT- Home , SLP-Home Projected Follow-up: PT-Outpatient PT, OT-  None, SLP-Outpatient SLP Projected Equipment Needs: PT-To be determined, OT- None recommended by OT, SLP-None recommended by SLP Equipment Details: PT-Will determine DME needs based on patient's progress, OT-  Patient/family involved in discharge planning: PT- Patient,  OT-Patient, SLP-Patient  MD ELOS:  Medical Rehab Prognosis:  Good Assessment: 7-11 days S/modI [name] is an 86 year old woman who was admitted to CIR with decreased functional mobility secondary to traumatic bilateral frontal SAH with small amount of right occipital subarachnoid hemorrhage as well as nondisplaced superolateral orbital fracture. I have discontinued her SQ  heparin as she  had a drop in hemoglobin and is ambulating >250 feet. She complained of low back pain yesterday which has since improved with oxycodone and robaxin. She had some nausea after eating a big meal this morning which improved after Zofran. She also had some dizziness this morning. Cr has been elevated and I encouraged hydration and will repeat CMP, BMP and add iron panel for today.    See Team Conference Notes for weekly updates to the plan of care

## 2020-05-08 NOTE — Plan of Care (Signed)
Behavioral Plan   Rancho Level: Rancho [place]  Behavior to decrease/ eliminate:   -Up without staff assistance -Fall risk  Changes to environment:  -Lights on during the day, off at night  Interventions: -Chair pad alarm -Staff providing hand held assist when walking to bathroom  -Provide mobility with hand held assist when patient is complaining of back pain- hand held assist okay for down the hallway  Recommendations for interactions with patient: -Orient when appropriate -Remind to call before getting up -Please have hands on patient when walking  -Provide rational for intervention, redirect to task    Attendees:  Mena Goes, OT Uvaldo Rising, PT Erin, SLP Alpha, RN

## 2020-05-08 NOTE — Progress Notes (Addendum)
[place] PHYSICAL MEDICINE & REHABILITATION PROGRESS NOTE  Subjective/Complaints: No complaints this morning. Denies pain, constipation, insomnia Ate big breakfast this morning Had some nausea which improved with Zofran.   ROS:  Pt denies SOB, abd pain, CP, N/V/C/D, and vision changes   Objective: Vital Signs: Blood pressure 128/66, pulse 66, temperature 98.3 F (36.8 C), temperature source Oral, resp. rate 18, height 5\' 2"  (1.575 m), weight 51.3 kg, SpO2 98 %. No results found. Recent Labs    05/05/20 1643 05/07/20 0508  WBC 9.7 8.1  HGB 11.1* 9.5*  HCT 34.2* 29.6*  PLT 253 229   Recent Labs    05/05/20 1643 05/07/20 0508  NA  --  133*  K  --  4.4  CL  --  99  CO2  --  29  GLUCOSE  --  96  BUN  --  21  CREATININE 0.84 1.16*  CALCIUM  --  8.7*    Physical Exam: BP 128/66 (BP Location: Left Arm)   Pulse 66   Temp 98.3 F (36.8 C) (Oral)   Resp 18   Ht 5\' 2"  (1.575 m)   Wt 51.3 kg   SpO2 98%   BMI 20.69 kg/m  General: Alert and oriented x 3, No apparent distress HEENT: Head is normocephalic, atraumatic, PERRLA, EOMI, sclera anicteric, oral mucosa pink and moist, dentition intact, ext ear canals clear,  Neck: Supple without JVD or lymphadenopathy Heart: Reg rate and rhythm. No murmurs rubs or gallops Chest: CTA bilaterally without wheezes, rales, or rhonchi; no distress Abdomen: Soft, non-tender, non-distended, bowel sounds positive. Extremities: No clubbing, cyanosis, or edema. Pulses are 2+ Skin: See above.  Intact.  Warm and dry. Psych: poor  Memory- c/p back pain Musc: No edema in extremities.  No tenderness in extremities. Neuro: Alert, appropriate Makes eye contact with examiner.   She follows simple commands.   Motor: Grossly 5/5 throughout, unchanged    Assessment/Plan: 1. Functional deficits secondary to TBI which require 3+ hours per day of interdisciplinary therapy in a comprehensive inpatient rehab setting.  Physiatrist is providing  close team supervision and 24 hour management of active medical problems listed below.  Physiatrist and rehab team continue to assess barriers to discharge/monitor patient progress toward functional and medical goals  Care Tool:  Bathing    Body parts bathed by patient: Right arm, Left arm, Chest, Abdomen, Front perineal area, Buttocks, Right upper leg, Left upper leg, Right lower leg, Left lower leg, Face         Bathing assist Assist Level: Contact Guard/Touching assist     Upper Body Dressing/Undressing Upper body dressing Upper body dressing/undressing activity did not occur (including orthotics): N/A What is the patient wearing?: Pull over shirt    Upper body assist Assist Level: Supervision/Verbal cueing    Lower Body Dressing/Undressing Lower body dressing    Lower body dressing activity did not occur: N/A What is the patient wearing?: Pants     Lower body assist Assist for lower body dressing: Contact Guard/Touching assist     Toileting Toileting    Toileting assist Assist for toileting: Contact Guard/Touching assist     Transfers Chair/bed transfer  Transfers assist  Chair/bed transfer activity did not occur: N/A  Chair/bed transfer assist level: Contact Guard/Touching assist     Locomotion Ambulation   Ambulation assist      Assist level: Minimal Assistance - Patient > 75% Assistive device: No Device Max distance: 425'   Walk 10 feet activity  Assist     Assist level: Minimal Assistance - Patient > 75% Assistive device: No Device   Walk 50 feet activity   Assist    Assist level: Minimal Assistance - Patient > 75% Assistive device: No Device    Walk 150 feet activity   Assist    Assist level: Minimal Assistance - Patient > 75% Assistive device: No Device    Walk 10 feet on uneven surface  activity   Assist     Assist level: Minimal Assistance - Patient > 75%     Wheelchair     Assist Will patient use  wheelchair at discharge?: No (patient is a functional ambulator)   Wheelchair activity did not occur: N/A         Wheelchair 50 feet with 2 turns activity    Assist    Wheelchair 50 feet with 2 turns activity did not occur: N/A       Wheelchair 150 feet activity     Assist Wheelchair 150 feet activity did not occur: N/A          Medical Problem List and Plan: 1.  Decreased functional mobility secondary to traumatic bilateral frontal SAH with small amount of right occipital subarachnoid hemorrhage as well as nondisplaced superolateral orbital fracture  Team conference 7/27 2.  Antithrombotics: -DVT/anticoagulation: Subcutaneous heparin- d/c as hemoglobin is dropping and walking>250 feet             -antiplatelet therapy: N/A 3. Pain Management: Robaxin 500 mg every 6 hours and oxycodone as needed  7/27 well controlled 4. Mood: Patient on Celexa prior to admission.             -antipsychotic agents: N/A 5. Neuropsych: This patient is capable of making decisions on her own behalf. 6. Skin/Wound Care: Routine skin checks 7. Fluids/Electrolytes/Nutrition: Routine in and outs. 8.  Seizure prophylaxis.    Keppra 500 mg twice daily x 7 days.  EEG negative 9.  Hypertension.  Norvasc 2.5 mg daily.          7/27 well controlled 10.  Question syncope.  Carotid Dopplers negative.  Echocardiogram unremarkable.  Monitor with increased mobility 11.  Hypothyroidism.  TSH 4.079.    Continue Synthroid 12.  Hyperlipidemia.  Continue Crestor. 13. CKD stage III.  Latest creatinine 1.03.               Creatinine 0.92 on 7/24, up to 1.15 on 7/26. Encourage hydration 14.  Acute blood loss anemia             Hemoglobin 11.1 on 7/24, down to 9.5 on 7/26. Repeat Hgb today with iron level 15. Constipation  7/26- only 1 small BM in last 7 days- will order sorbitol and prn enema if needed   LOS: 3 days A FACE TO FACE EVALUATION WAS PERFORMED  [name] 05/08/2020, 9:27 AM

## 2020-05-08 NOTE — Plan of Care (Signed)
  Problem: Consults Goal: RH BRAIN INJURY PATIENT EDUCATION Description: Description: See Patient Education module for eduction specifics Outcome: Progressing   Problem: RH SKIN INTEGRITY Goal: RH STG MAINTAIN SKIN INTEGRITY WITH ASSISTANCE Description: STG Maintain Skin Integrity With mod I Assistance. Outcome: Progressing   Problem: RH COGNITION-NURSING Goal: RH STG ANTICIPATES NEEDS/CALLS FOR ASSIST W/ASSIST/CUES Description: STG Anticipates Needs/Calls for Assist With mod I Assistance/Cues. Outcome: Progressing   Problem: RH PAIN MANAGEMENT Goal: RH STG PAIN MANAGED AT OR BELOW PT'S PAIN GOAL Description: Less than 5 out of 10 Outcome: Progressing   Problem: RH KNOWLEDGE DEFICIT BRAIN INJURY Goal: RH STG INCREASE KNOWLEDGE OF SELF CARE AFTER BRAIN INJURY Description: Increase knowledge of self care and medication regimen through education from staff with [facility] assist.  Outcome: Progressing

## 2020-05-08 NOTE — Progress Notes (Signed)
Physical Therapy Session Note  Patient Details  Name: [name] MRN: [id number] Date of Birth: [birth year]  Today's Date: 05/08/2020   Pt presents in recliner with eyes closed and reports she is just not feeling well. Has emesis bag in her lap and states she had already filled one up. She complains of dizziness, headache and overall not feeling well. Reports nausea has improved some. Pt states she would like to get up and walk but doesn't feel like she could do anything right now. Pt prefers to stay in recliner at this time. She states she missed therapy this morning with PT also. Will follow up as able.     Therapy Documentation Precautions:  Precautions Precautions: Fall Restrictions Weight Bearing Restrictions: No General: PT Amount of Missed Time (min): 30 Minutes PT Missed Treatment Reason: Patient ill (Comment)   Juanna Cao, PT, DPT, CBIS  05/08/2020, 11:15 AM

## 2020-05-08 NOTE — Progress Notes (Signed)
Daughter [name]) called r/t update and status of mother condition and requesting to speak with MD and therapist, Support provided and will be speaking with medical team in the morning

## 2020-05-08 NOTE — Progress Notes (Signed)
Patient ID: [name], female   DOB: 19-Nov-1930, 86 y.o.   MRN: [id number]   SW met with pt and pt husband in room to provide updates from team conference, and d/c date 7/30.  SW called pt dtr [name] [phone]) to provide above updates. Confirms she will be here tomorrow for family edu tomorrow 1pm-3pm. States to call pt sister [name] to confirm if she will be here for family edu.   Loralee Pacas, MSW, [place] Office: [phone] Cell: [phone] Fax: [phone]

## 2020-05-08 NOTE — Progress Notes (Signed)
Occupational Therapy Session Note  Patient Details  Name: [name] MRN: [id number] Date of Birth: 1931/02/16  Today's Date: 05/08/2020 OT Individual Time: 5501-5868 OT Individual Time Calculation (min): 55 min    Short Term Goals: Week 1:  OT Short Term Goal 1 (Week 1): STG=LTG d/t ELOS  Skilled Therapeutic Interventions/Progress Updates:    Pt received EOB eating breakfast. Improved orientation this session, pt remembering day of week. Pt reporting feeling much better than yesterday. Discussed conference and d/c planning and then pt agreeable to take shower. Pt ambulated around room to gather items with close (S). Pt transferred into the walk in shower and used TTB with CGA. Pt completed all bathing with set up assist sit <> stand with good use of grab bars. Pt transferred out of shower and to recliner with close (S). Pt donned shirt seated with (S). Pants donned with close (S). Pt stood at the sink and brushed teeth with (S). Pt was left sitting up in the recliner with all needs met, chair alarm set.   Therapy Documentation Precautions:  Precautions Precautions: Fall Restrictions Weight Bearing Restrictions: No   Therapy/Group: Individual Therapy  Curtis Sites 05/08/2020, 6:25 AM

## 2020-05-09 ENCOUNTER — Ambulatory Visit (HOSPITAL_COMMUNITY): Payer: Medicare Other

## 2020-05-09 ENCOUNTER — Inpatient Hospital Stay (HOSPITAL_COMMUNITY): Payer: Medicare Other

## 2020-05-09 ENCOUNTER — Encounter (HOSPITAL_COMMUNITY): Payer: Medicare Other

## 2020-05-09 MED ORDER — METHOCARBAMOL 500 MG PO TABS
500.0000 mg | ORAL_TABLET | Freq: Four times a day (QID) | ORAL | Status: DC | PRN
  Filled 2020-05-09: qty 1

## 2020-05-09 NOTE — Progress Notes (Signed)
Occupational Therapy Session Note  Patient Details  Name: [name] MRN: [id number] Date of Birth: 1930/11/26  Today's Date: 05/09/2020 OT Individual Time: 1314-3888 OT Individual Time Calculation (min): 72 min   Session 2: OT Individual Time: 7579-7282 OT Individual Time Calculation (min): 48 min  12 min missed 2/2 pain and fatigue  Short Term Goals: Week 1:  OT Short Term Goal 1 (Week 1): STG=LTG d/t ELOS  Skilled Therapeutic Interventions/Progress Updates:    Pt received supine with no c/o pain. Slight dizziness reported upon transfer to standing. Pt requested to shower. Pt completed ambulatory transfer to the toilet with CGA, preferring to use HHA for stability. Pt unable to void BM. Pt transferred into the walk in shower with TTB with (S). Pt completed all bathing sit <> stand with set up assist. Pt demonstrating more safety awareness and had good use of grab bars. Pt returned to recliner after drying off thoroughly. Pt donned shirt with set up assist. Pants and socks donned with (S).  While seated in recliner pt c/o "catching feeling" in the center of her chest. Pt did not endorse pain or any SOB. Vitals assessed and listed below. RN alerted to symptoms. Symptoms resolved within ~5 minutes. BP: 134/68, SpO2 97, HR 84.  Pt sat at the sink and completed grooming tasks with set up assist. Discussed d/c planning and upcoming family education session. Pt was left sitting up in the recliner with all needs met. RN present.    Session 2:  Approached by PT in the hallway re pt vestibular symptoms and similar chest symptoms as earlier in the day. Assessed pt vitals, BP 141/78, HR 74, and SpO2 97%. Pt asymptomatic but anxious re testing that occurred with PT. Pt's husband and daughter [name] present for entire session. Focus on TBI education and safety awareness. Provided TBI booklet and explained rancho level VIII and the implications of remaining deficits. Pt and family were receptive to all  education. Pt agreeable to try functional mobility despite current complaints of back and L hip pain. Pt completed 400 ft with 1 seated rest break with more assist than her usual- CGA. Discussed HHA with family and importance of rest breaks. Pt returned to her room and was left sitting up in the recliner with all needs met. K-pad placed under her L hip for pain relief. 12 min missed 2/2 pain and fatigue.   Therapy Documentation Precautions:  Precautions Precautions: Fall Restrictions Weight Bearing Restrictions: No   Therapy/Group: Individual Therapy  Curtis Sites 05/09/2020, 6:35 AM

## 2020-05-09 NOTE — Progress Notes (Signed)
[place] PHYSICAL MEDICINE & REHABILITATION PROGRESS NOTE  Subjective/Complaints: No complaints this morning. Appears much brighter Showering, working with OT Katharine Look Still has some vertigo, discussed with Cherie who will do Epley maneuver today  ROS:  Pt denies SOB, abd pain, CP, N/V/C/D, and vision changes  Objective: Vital Signs: Blood pressure 106/70, pulse 69, temperature 98.4 F (36.9 C), temperature source Oral, resp. rate 17, height 5\' 2"  (1.575 m), weight 51.3 kg, SpO2 97 %. No results found. Recent Labs    05/07/20 0508 05/08/20 1000  WBC 8.1 10.1  HGB 9.5* 10.9*  HCT 29.6* 33.6*  PLT 229 308   Recent Labs    05/07/20 0508 05/08/20 1000  NA 133* 130*  K 4.4 4.0  CL 99 92*  CO2 29 27  GLUCOSE 96 128*  BUN 21 16  CREATININE 1.16* 0.96  CALCIUM 8.7* 9.1    Physical Exam: BP 106/70 (BP Location: Right Arm)   Pulse 69   Temp 98.4 F (36.9 C) (Oral)   Resp 17   Ht 5\' 2"  (1.575 m)   Wt 51.3 kg   SpO2 97%   BMI 20.69 kg/m  General: Alert and oriented x 3, No apparent distress HEENT: Head is normocephalic, atraumatic, PERRLA, EOMI, sclera anicteric, oral mucosa pink and moist, dentition intact, ext ear canals clear,  Neck: Supple without JVD or lymphadenopathy Heart: Reg rate and rhythm. No murmurs rubs or gallops Chest: CTA bilaterally without wheezes, rales, or rhonchi; no distress Abdomen: Soft, non-tender, non-distended, bowel sounds positive. Extremities: No clubbing, cyanosis, or edema. Pulses are 2+ Skin: See above.  Intact.  Warm and dry. Psych: poor  Memory- c/p back pain Musc: No edema in extremities.  No tenderness in extremities. Neuro: Alert, appropriate Makes eye contact with examiner.   She follows simple commands.   Motor: Grossly 5/5 throughout, unchanged   Assessment/Plan: 1. Functional deficits secondary to TBI which require 3+ hours per day of interdisciplinary therapy in a comprehensive inpatient rehab setting.  Physiatrist  is providing close team supervision and 24 hour management of active medical problems listed below.  Physiatrist and rehab team continue to assess barriers to discharge/monitor patient progress toward functional and medical goals  Care Tool:  Bathing    Body parts bathed by patient: Right arm, Left arm, Chest, Abdomen, Front perineal area, Buttocks, Right upper leg, Left upper leg, Right lower leg, Left lower leg, Face         Bathing assist Assist Level: Set up assist     Upper Body Dressing/Undressing Upper body dressing Upper body dressing/undressing activity did not occur (including orthotics): N/A What is the patient wearing?: Pull over shirt    Upper body assist Assist Level: Set up assist    Lower Body Dressing/Undressing Lower body dressing    Lower body dressing activity did not occur: N/A What is the patient wearing?: Pants     Lower body assist Assist for lower body dressing: Supervision/Verbal cueing     Toileting Toileting    Toileting assist Assist for toileting: Contact Guard/Touching assist     Transfers Chair/bed transfer  Transfers assist  Chair/bed transfer activity did not occur: N/A  Chair/bed transfer assist level: Supervision/Verbal cueing     Locomotion Ambulation   Ambulation assist      Assist level: Minimal Assistance - Patient > 75% Assistive device: No Device Max distance: 425'   Walk 10 feet activity   Assist     Assist level: Minimal Assistance - Patient >  75% Assistive device: No Device   Walk 50 feet activity   Assist    Assist level: Minimal Assistance - Patient > 75% Assistive device: No Device    Walk 150 feet activity   Assist    Assist level: Minimal Assistance - Patient > 75% Assistive device: No Device    Walk 10 feet on uneven surface  activity   Assist     Assist level: Minimal Assistance - Patient > 75%     Wheelchair     Assist Will patient use wheelchair at discharge?: No  (patient is a functional ambulator)   Wheelchair activity did not occur: N/A         Wheelchair 50 feet with 2 turns activity    Assist    Wheelchair 50 feet with 2 turns activity did not occur: N/A       Wheelchair 150 feet activity     Assist Wheelchair 150 feet activity did not occur: N/A          Medical Problem List and Plan: 1.  Decreased functional mobility secondary to traumatic bilateral frontal SAH with small amount of right occipital subarachnoid hemorrhage as well as nondisplaced superolateral orbital fracture  Team conference 7/27  -Continue CIR 2.  Antithrombotics: -DVT/anticoagulation: Subcutaneous heparin- d/c as hemoglobin is dropping and walking>250 feet             -antiplatelet therapy: N/A 3. Pain Management: Robaxin 500 mg every 6 hours and oxycodone as needed  7/28: well controlled 4. Mood: Patient on Celexa prior to admission.             -antipsychotic agents: N/A 5. Neuropsych: This patient is capable of making decisions on her own behalf. 6. Skin/Wound Care: Routine skin checks 7. Fluids/Electrolytes/Nutrition: Routine in and outs. 8.  Seizure prophylaxis.    Keppra 500 mg twice daily x 7 days.  EEG negative 9.  Hypertension.  Norvasc 2.5 mg daily.          7/28: slightly elevated. Continue to monitor.  10.  Question syncope.  Carotid Dopplers negative.  Echocardiogram unremarkable.  Monitor with increased mobility 11.  Hypothyroidism.  TSH 4.079.    Continue Synthroid 12.  Hyperlipidemia.  Continue Crestor. 13. CKD stage III.  Latest creatinine 1.03.               Creatinine 0.92 on 7/24, up to 1.15 on 7/26, improved on 7/27.  Encourage hydration 14.  Acute blood loss anemia             Hemoglobin 11.1 on 7/24, down to 9.5 on 7/26, improved on 7/27 15. Constipation  7/26- only 1 small BM in last 7 days- will order sorbitol and prn enema if needed   LOS: 4 days A FACE TO FACE EVALUATION WAS PERFORMED  Vidit Boissonneault P  Philicia Heyne 05/09/2020, 10:50 AM

## 2020-05-09 NOTE — Discharge Summary (Signed)
Physician Discharge Summary  Patient ID: [name] MRN: [id number] DOB/AGE: Jul 12, 1931 86 y.o.  Admit date: 05/05/2020 Discharge date: 05/14/2020  Discharge Diagnoses:  Principal Problem:   TBI (traumatic brain injury) [facility]) DVT prophylaxis Pain management Hypertension Hypothyroidism Hyperlipidemia Seizure prophylaxis CKD stage III Constipation History of Covid January 2021 History of left breast cancer Mood stabilization  Discharged Condition: Stable  Significant Diagnostic Studies: DG Lumbar Spine 2-3 Views  Result Date: 05/04/2020 CLINICAL DATA:  Status post fall. EXAM: LUMBAR SPINE - 2-3 VIEW COMPARISON:  May 17, 2013 FINDINGS: There is no evidence of an acute lumbar spine fracture. A chronic compression fracture deformity is seen involving the L1 vertebral body. Mild-to-moderate severity multilevel endplate sclerosis is seen. There is stable, approximately 2 mm retrolisthesis of the L3 vertebral body on L4. Mild multilevel intervertebral disc space narrowing is seen. This is most prominent along the posterior aspect of the L3-L4 level. Moderate severity calcification of the abdominal aorta is noted. IMPRESSION: 1. Stable chronic compression fracture deformity of the L1 vertebral body. 2. Stable minimal retrolisthesis of L3 on L4. 3. Mild to moderate multilevel degenerative disc disease. Electronically Signed   By: Virgina Norfolk M.D.   On: 05/04/2020 15:45   CT HEAD WO CONTRAST  Result Date: 04/30/2020 CLINICAL DATA:  Follow-up intracranial hemorrhage.  Trauma. EXAM: CT HEAD WITHOUT CONTRAST TECHNIQUE: Contiguous axial images were obtained from the base of the skull through the vertex without intravenous contrast. COMPARISON:  04/29/2020 FINDINGS: Brain: Small to moderate volume acute subarachnoid hemorrhage, greatest over the frontal convexities, demonstrates slight interval redistribution but has not significantly changed in overall amount. No definite intraparenchymal or  subdural hemorrhage is identified. No acute infarct or midline shift is evident. There is mild cerebral atrophy. Hypodensities in the cerebral white matter bilaterally are unchanged and nonspecific but compatible with chronic small vessel ischemic disease which is mild for age. A 7 mm calcified mass along the tentorium on the left is unchanged and consistent with a small meningioma without significant associated mass effect or brain edema. Vascular: Calcified atherosclerosis at the skull base. No hyperdense vessel. Skull: No depressed skull fracture. Sinuses/Orbits: Bilateral sphenoid and left ethmoid sinus fluid levels. Clear mastoid air cells. Bilateral cataract extraction. Mildly decreased left periorbital soft tissue swelling. Other: None. IMPRESSION: 1. Unchanged subarachnoid hemorrhage. 2. No new intracranial abnormality. Electronically Signed   By: Logan Bores M.D.   On: 04/30/2020 05:49   CT Head Wo Contrast  Result Date: 04/29/2020 CLINICAL DATA:  Poly trauma. Loss of consciousness in confusion. EXAM: CT HEAD WITHOUT CONTRAST TECHNIQUE: Contiguous axial images were obtained from the base of the skull through the vertex without intravenous contrast. COMPARISON:  Feb 22, 2019 FINDINGS: Brain: There is no evidence of acute infarction. There is an acute intracranial hemorrhage with bilateral frontal subarachnoid hemorrhage and small amount of right occipital subarachnoid hemorrhage. There is a focus of possible intraparenchymal hemorrhage in the left frontal subcortical gray matter. No evidence of midline shift or herniation. Moderate brain parenchymal volume loss and deep white matter microangiopathy. Vascular: Calcific atherosclerotic disease of the intra cavernous carotid arteries. Skull: Nondisplaced superolateral orbital fracture. Other: Left frontotemporal and periorbital preseptal scalp hematoma. IMPRESSION: 1. Acute intracranial hemorrhage with bilateral frontal subarachnoid hemorrhage and small  amount of right occipital subarachnoid hemorrhage. 2. Possible focus of intraparenchymal hemorrhage in the left frontal subcortical gray matter. 3. Left frontotemporal and periorbital preseptal scalp hematoma. 4. Nondisplaced superolateral orbital fracture. Electronically Signed   By: Fidela Salisbury  M.D.   On: 04/29/2020 11:53   CT Cervical Spine Wo Contrast  Result Date: 04/29/2020 CLINICAL DATA:  Status post fall. EXAM: CT CERVICAL SPINE WITHOUT CONTRAST TECHNIQUE: Multidetector CT imaging of the cervical spine was performed without intravenous contrast. Multiplanar CT image reconstructions were also generated. COMPARISON:  Cervical spine radiograph and MRI from 2008, report only. FINDINGS: Alignment: Normal. Skull base and vertebrae: No acute fracture. No primary bone lesion or focal pathologic process. Soft tissues and spinal canal: No prevertebral fluid or swelling. No visible canal hematoma. Disc levels: Multilevel osteoarthritic changes of the cervical spine. Upper chest: Biapical pleural calcifications. IMPRESSION: 1. No evidence of acute traumatic injury to the cervical spine. 2. Multilevel osteoarthritic changes of the cervical spine. Electronically Signed   By: Fidela Salisbury M.D.   On: 04/29/2020 12:11   EEG adult  Result Date: 04/30/2020 Lora Havens, MD     04/30/2020 11:00 AM Patient Name: [name] MRN: [id number] Epilepsy Attending: Lora Havens Referring Physician/Provider: Dr Karmen Bongo Date: 04/30/2020 Duration: 24.55 mins Patient history: 86yo F with repeated falls and now with [facility]. EEG to evaluate for seizure. Level of alertness: Awake, drowsy AEDs during EEG study: LEV Technical aspects: This EEG study was done with scalp electrodes positioned according to the 10-20 International system of electrode placement. Electrical activity was acquired at a sampling rate of 500Hz  and reviewed with a high frequency filter of 70Hz  and a low frequency filter of 1Hz . EEG data  were recorded continuously and digitally stored. Description: The posterior dominant rhythm consists of 9 Hz activity of moderate voltage (25-35 uV) seen predominantly in posterior head regions, symmetric and reactive to eye opening and eye closing. Drowsiness was characterized by attenuation of the posterior background rhythm. Physiology photic driving was seen during photic stimulation.  Hyperventilation was not performed.   IMPRESSION: This study is within normal limits. No seizures or epileptiform discharges were seen throughout the recording. Lora Havens   ECHOCARDIOGRAM COMPLETE  Result Date: 04/30/2020    ECHOCARDIOGRAM REPORT   Patient Name:   DALICIA KISNER Sadik Date of Exam: 04/30/2020 Medical Rec #:  [id number]     Height:       62.0 in Accession #:    [id number]    Weight:       112.9 lb Date of Birth:  06/11/1931     BSA:          1.499 m Patient Age:    74 years      BP:           129/61 mmHg Patient Gender: F             HR:           61 bpm. Exam Location:  Inpatient Procedure: 2D Echo, Color Doppler and Cardiac Doppler Indications:    R55 Syncope  History:        Patient has prior history of Echocardiogram examinations, most                 recent 07/22/2019. Risk Factors:Hypertension and Dyslipidemia.                 COVID+ in January 2021.  Sonographer:    Raquel Sarna Senior RDCS Referring Phys: Isabel  1. Left ventricular ejection fraction, by estimation, is 60 to 65%. The left ventricle has normal function. The left ventricle has no regional wall motion abnormalities. Left ventricular diastolic parameters are indeterminate.  2. Right  ventricular systolic function is normal. The right ventricular size is normal. There is mildly elevated pulmonary artery systolic pressure.  3. Right atrial size was moderately dilated.  4. The pericardial effusion is circumferential.  5. The mitral valve is normal in structure. Mild mitral valve regurgitation. No evidence of mitral stenosis.  6.  The aortic valve is tricuspid. Aortic valve regurgitation is not visualized. Mild aortic valve sclerosis is present, with no evidence of aortic valve stenosis.  7. The inferior vena cava is normal in size with <50% respiratory variability, suggesting right atrial pressure of 8 mmHg. Comparison(s): No significant change from prior study. Conclusion(s)/Recommendation(s): Normal biventricular function without evidence of hemodynamically significant valvular heart disease. FINDINGS  Left Ventricle: Left ventricular ejection fraction, by estimation, is 60 to 65%. The left ventricle has normal function. The left ventricle has no regional wall motion abnormalities. The left ventricular internal cavity size was normal in size. There is  no left ventricular hypertrophy. Left ventricular diastolic parameters are indeterminate. Right Ventricle: The right ventricular size is normal. No increase in right ventricular wall thickness. Right ventricular systolic function is normal. There is mildly elevated pulmonary artery systolic pressure. The tricuspid regurgitant velocity is 2.96  m/s, and with an assumed right atrial pressure of 8 mmHg, the estimated right ventricular systolic pressure is 19.5 mmHg. Left Atrium: Left atrial size was normal in size. Right Atrium: Right atrial size was moderately dilated. Pericardium: Mix of small amount of echolucent (fluid) and medium echogenicity adjacent to epicardium. Likely pericardial fat pad, but cannot exclude other etiologies based on images. A small pericardial effusion is present. The pericardial effusion is circumferential. Mitral Valve: The mitral valve is normal in structure. Mild mitral valve regurgitation. No evidence of mitral valve stenosis. Tricuspid Valve: The tricuspid valve is normal in structure. Tricuspid valve regurgitation is mild . No evidence of tricuspid stenosis. Aortic Valve: The aortic valve is tricuspid. Aortic valve regurgitation is not visualized. Mild aortic  valve sclerosis is present, with no evidence of aortic valve stenosis. There is mild calcification of the aortic valve. Pulmonic Valve: The pulmonic valve was not well visualized. Pulmonic valve regurgitation is trivial. No evidence of pulmonic stenosis. Aorta: The aortic root and ascending aorta are structurally normal, with no evidence of dilitation. Venous: The inferior vena cava is normal in size with less than 50% respiratory variability, suggesting right atrial pressure of 8 mmHg. IAS/Shunts: The atrial septum is grossly normal.  LEFT VENTRICLE PLAX 2D LVIDd:         4.10 cm  Diastology LVIDs:         2.20 cm  LV e' lateral:   7.40 cm/s LV PW:         0.80 cm  LV E/e' lateral: 11.5 LV IVS:        0.90 cm  LV e' medial:    5.77 cm/s LVOT diam:     2.00 cm  LV E/e' medial:  14.7 LV SV:         67 LV SV Index:   45 LVOT Area:     3.14 cm  RIGHT VENTRICLE RV S prime:     11.40 cm/s TAPSE (M-mode): 1.9 cm LEFT ATRIUM           Index       RIGHT ATRIUM           Index LA diam:      3.10 cm 2.07 cm/m  RA Area:     17.50 cm LA  Vol (A2C): 29.1 ml 19.41 ml/m RA Volume:   45.90 ml  30.62 ml/m LA Vol (A4C): 30.8 ml 20.55 ml/m  AORTIC VALVE LVOT Vmax:   88.80 cm/s LVOT Vmean:  70.000 cm/s LVOT VTI:    0.214 m  AORTA Ao Root diam: 2.80 cm Ao Asc diam:  2.90 cm MITRAL VALVE               TRICUSPID VALVE MV Area (PHT): 3.72 cm    TR Peak grad:   35.0 mmHg MV Decel Time: 204 msec    TR Vmax:        296.00 cm/s MV E velocity: 84.80 cm/s MV A velocity: 90.00 cm/s  SHUNTS MV E/A ratio:  0.94        Systemic VTI:  0.21 m                            Systemic Diam: 2.00 cm Buford Dresser MD Electronically signed by Buford Dresser MD Signature Date/Time: 04/30/2020/1:05:33 PM    Final    DG HIP UNILAT WITH PELVIS 2-3 VIEWS LEFT  Result Date: 05/10/2020 CLINICAL DATA:  Left hip pain.  Fall Sunday at church. EXAM: DG HIP (WITH OR WITHOUT PELVIS) 2-3V LEFT COMPARISON:  None. FINDINGS: The cortical margins of the  bony pelvis and left hip are intact. No fracture. Pubic symphysis and sacroiliac joints are congruent. Pubic rami are intact. Both femoral heads are well-seated in the respective acetabula. No significant hip joint space narrowing. IMPRESSION: No fracture of the pelvis or left hip. Electronically Signed   By: Melanie  Sanford M.D.   On: 05/10/2020 16:14   VAS US CAROTID  Result Date: 05/02/2020 Carotid Arterial Duplex Study Indications:       Syncope. Risk Factors:      Hypertension, hyperlipidemia. Comparison Study:  no prior Performing Technologist: Megan Riddle RVS  Examination Guidelines: A complete evaluation includes B-mode imaging, spectral Doppler, color Doppler, and power Doppler as needed of all accessible portions of each vessel. Bilateral testing is considered an integral part of a complete examination. Limited examinations for reoccurring indications may be performed as noted.  Right Carotid Findings: +----------+--------+--------+--------+------------------+--------+           PSV cm/sEDV cm/sStenosisPlaque DescriptionComments +----------+--------+--------+--------+------------------+--------+ CCA Prox  86      16              heterogenous               +----------+--------+--------+--------+------------------+--------+ CCA Distal63      12              heterogenous               +----------+--------+--------+--------+------------------+--------+ ICA Prox  76      22      1-39%   heterogenous               +----------+--------+--------+--------+------------------+--------+ ICA Distal76      22                                         +----------+--------+--------+--------+------------------+--------+ ECA       76       7                                          +----------+--------+--------+--------+------------------+--------+ +----------+--------+-------+--------+-------------------+  PSV cm/sEDV cmsDescribeArm Pressure (mmHG)  +----------+--------+-------+--------+-------------------+ [id number]                                         +----------+--------+-------+--------+-------------------+ +---------+--------+--+--------+--+---------+ VertebralPSV cm/s44EDV cm/s10Antegrade +---------+--------+--+--------+--+---------+  Left Carotid Findings: +----------+--------+--------+--------+------------------+--------+           PSV cm/sEDV cm/sStenosisPlaque DescriptionComments +----------+--------+--------+--------+------------------+--------+ CCA Prox  72      19              heterogenous               +----------+--------+--------+--------+------------------+--------+ CCA Distal69      18              heterogenous               +----------+--------+--------+--------+------------------+--------+ ICA Prox  76      25      1-39%   heterogenous               +----------+--------+--------+--------+------------------+--------+ ICA Distal55      14                                         +----------+--------+--------+--------+------------------+--------+ ECA       69      13                                         +----------+--------+--------+--------+------------------+--------+ +----------+--------+--------+--------+-------------------+           PSV cm/sEDV cm/sDescribeArm Pressure (mmHG) +----------+--------+--------+--------+-------------------+ [id number]                                          +----------+--------+--------+--------+-------------------+ +---------+--------+--+--------+-+---------+ VertebralPSV cm/s35EDV cm/s7Antegrade +---------+--------+--+--------+-+---------+   Summary: Right Carotid: Velocities in the right ICA are consistent with a 1-39% stenosis. Left Carotid: Velocities in the left ICA are consistent with a 1-39% stenosis. Vertebrals: Bilateral vertebral arteries demonstrate antegrade flow. *See table(s) above for measurements and  observations.  Electronically signed by Ruta Hinds MD on 05/02/2020 at 6:13:43 PM.    Final    CT Maxillofacial Wo Contrast  Result Date: 04/29/2020 CLINICAL DATA:  Status post fall with facial trauma. EXAM: CT MAXILLOFACIAL WITHOUT CONTRAST TECHNIQUE: Multidetector CT imaging of the maxillofacial structures was performed. Multiplanar CT image reconstructions were also generated. COMPARISON:  None. FINDINGS: Osseous: No mandibular fracture or dislocation. No destructive process. Possible small nondisplaced fracture of the superolateral left orbital wall. Orbits: Moderate left periorbital preseptal hematoma. The orbital contents are intact. Sinuses: Partial opacification of the bilateral ethmoid sinuses. Small air-fluid level in the left maxillary sinus. Soft tissues: Left periorbital scalp hematoma. IMPRESSION: 1. Possible small nondisplaced fracture of the superolateral left orbital wall. 2. Moderate left periorbital preseptal hematoma. The orbital contents are intact. 3. Partial opacification of the bilateral ethmoid sinuses. Small air-fluid level in the left maxillary sinus. Electronically Signed   By: Fidela Salisbury M.D.   On: 04/29/2020 12:07    Labs:  Basic Metabolic Panel: Recent Labs  Lab 05/05/20 0552 05/05/20 1643 05/07/20 0508 05/08/20 1000  NA 135  --  133* 130*  K 4.3  --  4.4 4.0  CL 101  --  99 92*  CO2 24  --  29 27  GLUCOSE 124*  --  96 128*  BUN 14  --  21 16  CREATININE 0.92 0.84 1.16* 0.96  CALCIUM 9.1  --  8.7* 9.1  MG 1.7  --   --   --     CBC: Recent Labs  Lab 05/05/20 0552 05/05/20 0552 05/05/20 1643 05/07/20 0508 05/08/20 1000  WBC 10.1   < > 9.7 8.1 10.1  NEUTROABS 7.5  --   --  5.2 7.3  HGB 11.2*   < > 11.1* 9.5* 10.9*  HCT 34.5*   < > 34.2* 29.6* 33.6*  MCV 93.2   < > 91.7 93.4 93.1  PLT 204   < > 253 229 308   < > = values in this interval not displayed.    CBG: No results for input(s): GLUCAP in the last 168 hours. Family history.   Father with CAD.  Brother with brain cancer.  Paternal aunt with breast cancer.  Denies any diabetes mellitus colon cancer or rectal cancer  Brief HPI:   [name] is a 86 y.o. right-handed female with history of left breast cancer status post chemotherapy and radiation 2010, Covid January 2021, hypertension, hypothyroidism, Barrett's esophagus, hyperlipidemia.  Patient lives with husband independent prior to admission.  1 level home 5 steps to entry.  Presented 04/29/2020 after a fall questionable syncopal event while at church with transient loss of consciousness.  Cranial CT scan showed acute intracranial hemorrhage with bilateral frontal subarachnoid hemorrhage and a small amount of right occipital subarachnoid hemorrhage.  Possible focus of intraparenchymal hemorrhage in the left frontal and subcortical gray-white matter.  Left frontotemporal periorbital preseptal scalp hematoma as well as nondisplaced superolateral orbital fracture.  CT cervical spine negative.  Admission chemistries unremarkable except glucose 116 creatinine 1.06, hemoglobin 12.2, SARS coronavirus negative.  Urinalysis negative nitrite.  Neurosurgery Dr. Sherley Bounds conservative care with follow-up cranial CT scan stable.  Maintained on Keppra for seizure prophylaxis and tapered.  No surgical intervention needed for left lateral orbital fracture per Dr. Melida Quitter.  EEG negative for seizure.  Echocardiogram with ejection fraction of 65% no wall motion abnormalities.  Carotid Dopplers no ICA stenosis.  Follow-up chemistries improved creatinine 0.91 hemoglobin 10.9.  Patient was cleared to begin subcutaneous heparin for DVT prophylaxis.  Tolerating a regular diet.  Patient was admitted for a comprehensive rehab program   Hospital Course: VERONNICA HENNINGS was admitted to rehab 05/05/2020 for inpatient therapies to consist of PT, ST and OT at least three hours five days a week. Past admission physiatrist, therapy team and rehab RN have  worked together to provide customized collaborative inpatient rehab.  Pertaining to patient's traumatic bilateral frontal SAH small amount of right occipital subarachnoid hemorrhage nondisplaced superolateral orbital fracture remained stable conservative care with follow-up per neurosurgery Dr. Sherley Bounds in ENT Dr. Melida Quitter.  Ophthalmology follow-up 05/05/2020 noted exam to be unremarkable.  No retinal breaks or tears seen.  Patient had been on subcutaneous heparin for DVT prophylaxis discontinued his mobility improved close monitoring for any bleeding episodes. .  Pain managed with use of Robaxin oxycodone as needed.  Blood pressure remains soft on low-dose Norvasc and she would follow-up with her primary MD.  There was some question of possible syncope related to her fall.  Carotid Dopplers negative echocardiogram unremarkable no orthostasis noted.  Therapy did follow-up with vestibular testing positive vertebrobasilar insufficiency recommendations were  made for outpatient referral with neurology services.  Hypothyroidism TSH 4.079 she remained on hormone supplement.  She did have some mild hyponatremia 130 and monitored.  Hyperlipidemia Crestor as directed.  CKD stage III although latest creatinine 0.96.  Acute blood loss anemia with latest hemoglobin 10.9 her subcutaneous heparin had been discontinued.  No overt signs of bleeding noted.   Blood pressures were monitored on TID basis and controlled      Rehab course: During patient's stay in rehab weekly team conferences were held to monitor patient's progress, set goals and discuss barriers to discharge. At admission, patient required minimal assist 150 feet rolling walker minimal assist stand pivot transfers minimal assist supine to sit.  Set up upper body bathing minimal assist lower body bathing set up upper body dressing minimal assist lower body dressing  Physical exam.  Blood pressure 133/76 pulse 60 temperature 97.6 respirations 18 oxygen  saturation 97% room air Constitutional.  No acute distress HEENT Head.  Bilateral periorbital ecchymosis Eyes.  Pupils round and reactive to light no discharge without nystagmus Neck.  Supple nontender no JVD without thyromegaly Cardiac regular rate rhythm without any extra sounds or murmur heard Abdomen.  Soft nontender positive bowel sounds without rebound Respiratory effort normal no respiratory distress without wheeze Skin.  Warm and dry Neurological.  Makes good eye contact with examiner follows commands provides her name and age but was a bit tangential needed some verbal cues at times to redirect.  Grossly graded strength 5/5 throughout sensation intact  Marlana Salvage  has had improvement in activity tolerance, balance, postural control as well as ability to compensate for deficits. Marlana Salvage has had improvement in functional use RUE/LUE  and RLE/LLE as well as improvement in awareness.  Working with energy conservation techniques ambulates 255 feet hand-held assist.  Provided verbal cues for erect posture as tolerated.  Perform sit to supine minimal assist.  Sit to stand contact-guard assist.  She transferred to the walk-in shower contact-guard assist completed all bathing with set up transferred out of shower to recliner with supervision.  Put her shirt on supervision pants with close supervision.  Speech therapy follow-up patient recalled familiar and current medications with supervision.  She demonstrated ability to organize daily pills.  She did require some assistance for mental flexibility and rationale behind participation with therapy activities.  It was discussed with family need for assistance and supervision for safety at home.  She scored a 35/56 on Berg balance scale.Discharge was extended a few days to establish her mobilty needs and complete family teaching as well arrangements being made for assistance on discharge.  Family teaching was completed with anticipation of discharge to home.        Disposition: Discharged to home    Diet: Regular  Special Instructions: No driving smoking or alcohol  Medications at discharge 1.  Tylenol as needed 2.  Norvasc 2.5 mg p.o. daily 3.  Colace 100 mg p.o. twice daily hold for loose stools 4.Xalatan ophthalmic solution 0.05% 1 drop both eyes bedtime 5.  Synthroid 50 mcg p.o. daily 6.  Robaxin 500 mg every 6 hours as needed muscle spasms 7.  Multivitamin daily 8.  Oxycodone 2.5-5 mg every 4 hours as needed pain 9.  MiraLAX p.o. daily hold for loose stools 10.  Crestor 10 mg p.o. daily    30-35 minutes were spent completing discharge summary and discharge planning  Discharge Instructions    Ambulatory referral to Neurology   Complete by: As directed  An appointment is requested in approximately follow-up 2 to 4 weeks in regards to history of syncope vertebrobasilar insufficiency   Ambulatory referral to Physical Medicine Rehab   Complete by: As directed    Moderate complexity follow-up 1 to 2 weeks traumatic SAH       Follow-up Information    Meredith Staggers, MD Follow up.   Specialty: Physical Medicine and Rehabilitation Why: Office to call for appointment Contact information: [address] [place] [address] [phone]        Eustace Moore, MD Follow up.   Specialty: Neurosurgery Why: Call for appointment Contact information: [address] [address] [phone]        Melida Quitter, MD Follow up.   Specialty: Otolaryngology Why: Call for appointment Contact information: [address] [place] [address] [phone]               Signed: Cathlyn Parsons 05/11/2020, 3:06 PM

## 2020-05-09 NOTE — Progress Notes (Addendum)
Speech Language Pathology Discharge Summary  Patient Details  Name: [name] MRN: [id number] Date of Birth: [birth year]  Patient has met  (0) of 2 long term goals.  Patient to discharge at [facility] level.  Reasons goals not met: pt requires at least Min A level cues for anticipatory awareness, safety awareness, and complex problem solving   Clinical Impression/Discharge Summary:   Pt had slower than anticipated functional gains in combination with a very short length of stay, and therefore met 0 out of 2 long term goals this admission. Although pt can complete very basic tasks at Supervision A level, mildly complex to complex tasks require increased Min A cues for complex problem solving, error awareness, safety awareness, reducing impulsivity, and anticipatory awareness due to her cognitive impairments s/p acute TBI. She is currently exhibiting behaviors reminiscent of [place]. Due to level of assistance required for cognition, she will require 24/7 supervision at discharge for her greatest safety. Pt did make some gains while inpatient, demonstrating improvements in basic to mildly complex problem solving, and acceptance of education regarding mental flexibility and strategies to increase safety, mental flexibility, and accuracy during functional tasks. However, given cognitive deficits still present with functional impact on daily living, recommend pt continue to receive skilled OP ST services upon discharge. Pt education is complete at this time; although education with pt's husband was scheduled and attempted, he did not attend. He did attend 1 ST session, during which education was provided and recommendation for assistance with medication and finances and 24/7 supervision was noted, which he acknowledged.    Care Partner:  Caregiver Able to Provide Assistance: Yes  Type of Caregiver Assistance: Cognitive  Recommendation:  Outpatient SLP;24 hour supervision/assistance  Rationale for  SLP Follow Up: Maximize cognitive function and independence;Reduce caregiver burden   Equipment: none   Reasons for discharge: Discharged from hospital   Patient/Family Agrees with Progress Made and Goals Achieved: Yes    Arbutus Leas 05/14/2020, 10:55 AM

## 2020-05-09 NOTE — Progress Notes (Signed)
Physical Therapy Session Note  Patient Details  Name: [name] MRN: [id number] Date of Birth: 05-Sep-1931  Today's Date: 05/09/2020 PT Individual Time: 1300-1355 PT Individual Time Calculation (min): 55 min   Short Term Goals: Week 1:  PT Short Term Goal 1 (Week 1): STG=LTG due to ELOS  Skilled Therapeutic Interventions/Progress Updates:     Patient in recliner with her husband and daughter, [name], in the room upon PT arrival. Patient alert and agreeable to PT session. Patient denied pain during session, however, reported dizziness and mild nausea with change in position and head movements throughout session. Donned B tennis shoes with total A at beginning of session due to increased nausea with bending forward yesterday.   Focused session on family education and vestibular evaluation, see details below.   Educated patient and family about general BI signs/symptoms including agitation, fatigue, decreased memory, decreased safety awareness and awareness of deficits, and consequences of subsequent falls. Educated on patient's increased risk for falls and recommendations for 24/7 supervision for safety due to cognitive and balance deficits. Also discussed strategies for effective patient/caregiver communication and redirection with agitation or confusion. Patient and family receptive to all education and stated understanding. Family expressed concerns about 24/7 supervision, as patient's husband may need to run errands. Discussed possibility of patient accompanying him on short outings or timing outings when other family are available. Patient expressed concerns after session about her husband's willingness to provide 24/7 supervision. Will reinforce education and continue problem solving with family in sessions tomorrow.   Therapeutic Activity: Transfers: Patient performed sit to/from stand from the room recliner x1 with supervision using RW and x1 with min A for boosting up without AD and x1  from the ADL couch with min A for boosting up due to dizziness. Provided verbal cues for hand placement and forward weight shift.  Gait Training:  Patient ambulated 95 feet x2 using RW with supervision-CGA due to increased dizziness with head movements. Ambulated with decreased gait speed, decreased step length and height, increased B hip and knee flexion in stance, forward trunk lean, and downward head gaze. Provided verbal cues for increased step length .  Vestibular Evaluation: Cervical ROM slightly limited, but WFL, reported dizziness with R rotation and cervical extension Performed Vertebrobasilar insufficiency test in sitting: + bilaterally with increased symptoms on R compared to L  Symptoms: nausea and dizziness with onset <10 seconds on R and <20 sec on L    Symptom history: intermittent numbness in her fingers and feet daily, intermittent dysarthria and dysphasia with eating, vertigo, intermittent dizziness and nausea with changes in position (worse since admission), reports that she drinks ginger ale daily due to bouts of nausea at home, and presented to the hospital due to a fall secondary to syncopal episode.   Due to positive vertebrobasilar insufficiency test, further vestibular testing is contraindicated. Dr. Ranell Patrick, MD and Linna Hoff, [place] made aware and awaiting recommendations.   Patient and family education on purpose of testing and results and informed that MD and PA were made aware and will be making recommendations. Informed them that there are no mobility restrictions at this time and that they would be informed if any follow-up would be necessary. Patient and family stated understanding.  Patient also complained of sharp sudden chest pain during session, resolved <4 min in sittng, HR 78 BPM taken manually. RN made aware.   Patient in the recliner with her daughter in the room at end of session with breaks locked, bed alarm set,  and all needs within reach. Handed off to OT for  continued family education at end of session.   Returned to patient's room to inform her and her daughter that MD is consulting neurology. Patient requesting to return to bed and assisted patient back to bed with min A for transfers, gait with HHA, and sit to supine. Patient reported increased fatigue at this time. Patient in bed with her daughter at bedside with bed alarm set and all needs in reach upon PT departure.   Therapy Documentation Precautions:  Precautions Precautions: Fall Restrictions Weight Bearing Restrictions: No    Therapy/Group: Individual Therapy  [name] PT, DPT  05/09/2020, 4:02 PM

## 2020-05-09 NOTE — Progress Notes (Signed)
Occupational Therapy Discharge Summary  Patient Details  Name: [name] MRN: [id number] Date of Birth: 04/25/1931   Patient has met 7 of 7 long term goals due to improved activity tolerance, improved balance, postural control, ability to compensate for deficits, improved attention, improved awareness and improved coordination.  Patient to discharge at overall supervision level.  Patient's care partner is independent to provide the necessary physical and cognitive assistance at discharge. Several goals were downgraded to supervision d/t recent fluctuations in ability/cognitive status. Pt's usual status is (S) with her ADLs and transfers. Pt's family has attended multiple family education session and feel competent at providing more (S) at home.   Reasons goals not met: All goals met.   Recommendation:  No OT follow up recommended.   Equipment: No equipment provided  Reasons for discharge: treatment goals met and discharge from hospital  Patient/family agrees with progress made and goals achieved: Yes  OT Discharge Precautions/Restrictions  Precautions Precautions: Fall Restrictions Weight Bearing Restrictions: No Pain Pain Assessment Pain Scale: 0-10 Pain Score: 0-No pain ADL ADL Eating: Set up Where Assessed-Eating: Edge of bed Grooming: Modified independent Where Assessed-Grooming: Standing at sink Upper Body Bathing: Modified independent Where Assessed-Upper Body Bathing: Shower Lower Body Bathing: Modified independent Where Assessed-Lower Body Bathing: Shower Upper Body Dressing: Modified independent (Device) Where Assessed-Upper Body Dressing: Chair Lower Body Dressing: Modified independent Where Assessed-Lower Body Dressing: Chair Toileting: Modified independent Where Assessed-Toileting: Glass blower/designer: Diplomatic Services operational officer Method: Counselling psychologist: Energy manager: Distant supervision Press photographer Method: Heritage manager: Civil engineer, contracting with back Vision Baseline Vision/History: Wears glasses Wears Glasses: Reading only Patient Visual Report: No change from baseline Vision Assessment?: No apparent visual deficits Perception  Perception: Within Functional Limits Praxis Praxis: Intact Cognition Overall Cognitive Status: Impaired/Different from baseline Arousal/Alertness: Awake/alert Orientation Level: Oriented X4 Attention: Selective Selective Attention: Appears intact Memory: Impaired Memory Impairment: Decreased short term memory Decreased Short Term Memory: Verbal complex;Functional complex Awareness: Impaired Awareness Impairment: Anticipatory impairment Problem Solving: Impaired Problem Solving Impairment: Functional complex Safety/Judgment: Impaired Comments: Impaired self awareness and safety awareness [place] [employer] Scales of Cognitive Functioning: Purposeful/appropriate Sensation Sensation Light Touch: Appears Intact Hot/Cold: Appears Intact Proprioception: Appears Intact Coordination Gross Motor Movements are Fluid and Coordinated: No Fine Motor Movements are Fluid and Coordinated: Yes Coordination and Movement Description: Some generalized weakness still present, as well as balance deficits Motor  Motor Motor: Other (comment) Motor - Discharge Observations: Generalized weakness and instability Mobility  Bed Mobility Bed Mobility: Supine to Sit;Sit to Supine Supine to Sit: Independent Sit to Supine: Independent Transfers Sit to Stand: Independent with assistive device Stand to Sit: Independent  Trunk/Postural Assessment  Cervical Assessment Cervical Assessment: Within Functional Limits Thoracic Assessment Thoracic Assessment: Within Functional Limits Postural Control Postural Control: Deficits on evaluation Righting Reactions: delayed/decreased Protective Responses: delayed/decreased  Balance Balance Balance  Assessed: Yes Static Sitting Balance Static Sitting - Balance Support: No upper extremity supported;Feet supported Static Sitting - Level of Assistance: 7: Independent Dynamic Sitting Balance Dynamic Sitting - Balance Support: No upper extremity supported;Feet supported Dynamic Sitting - Level of Assistance: 7: Independent Static Standing Balance Static Standing - Balance Support: No upper extremity supported;During functional activity Static Standing - Level of Assistance: 7: Independent Dynamic Standing Balance Dynamic Standing - Balance Support: No upper extremity supported;During functional activity Dynamic Standing - Level of Assistance: 5: Stand by assistance Extremity/Trunk Assessment RUE Assessment RUE Assessment: Within Functional Limits LUE Assessment LUE Assessment:  Within Functional Limits   Curtis Sites 05/09/2020, 9:39 AM

## 2020-05-10 ENCOUNTER — Ambulatory Visit (HOSPITAL_COMMUNITY): Payer: Medicare Other

## 2020-05-10 ENCOUNTER — Encounter (HOSPITAL_COMMUNITY): Payer: Medicare Other | Admitting: Speech Pathology

## 2020-05-10 ENCOUNTER — Inpatient Hospital Stay (HOSPITAL_COMMUNITY): Payer: Medicare Other

## 2020-05-10 ENCOUNTER — Encounter (HOSPITAL_COMMUNITY): Payer: Medicare Other

## 2020-05-10 ENCOUNTER — Inpatient Hospital Stay (HOSPITAL_COMMUNITY): Payer: Medicare Other | Admitting: Physical Therapy

## 2020-05-10 MED ORDER — ACETAMINOPHEN 325 MG PO TABS: 650.0000 mg | ORAL_TABLET | Freq: Four times a day (QID) | ORAL | Status: DC | PRN

## 2020-05-10 MED ORDER — LEVOTHYROXINE SODIUM 50 MCG PO TABS: ORAL_TABLET | ORAL | 3 refills | Status: DC

## 2020-05-10 MED ORDER — CITALOPRAM HYDROBROMIDE 10 MG PO TABS: 10.0000 mg | ORAL_TABLET | Freq: Every day | ORAL | 3 refills | Status: DC

## 2020-05-10 MED ORDER — OXYCODONE HCL 5 MG PO TABS: 2.5000 mg | ORAL_TABLET | ORAL | 0 refills | Status: DC | PRN

## 2020-05-10 MED ORDER — POLYETHYLENE GLYCOL 3350 17 G PO PACK: 17.0000 g | PACK | Freq: Every day | ORAL | 0 refills | Status: DC

## 2020-05-10 MED ORDER — TOPIRAMATE 25 MG PO TABS
25.0000 mg | ORAL_TABLET | Freq: Every day | ORAL | Status: DC
  Administered 2020-05-10 – 2020-05-11 (×2): 25 mg via ORAL
  Filled 2020-05-10 (×2): qty 1

## 2020-05-10 MED ORDER — MUSCLE RUB 10-15 % EX CREA
TOPICAL_CREAM | CUTANEOUS | Status: DC | PRN
  Filled 2020-05-10: qty 85

## 2020-05-10 MED ORDER — ROSUVASTATIN CALCIUM 10 MG PO TABS: 10.0000 mg | ORAL_TABLET | Freq: Every day | ORAL | 1 refills | Status: DC

## 2020-05-10 MED ORDER — AMLODIPINE BESYLATE 2.5 MG PO TABS: 2.5000 mg | ORAL_TABLET | Freq: Every day | ORAL | 0 refills | Status: DC

## 2020-05-10 MED ORDER — DOCUSATE SODIUM 100 MG PO CAPS: 100.0000 mg | ORAL_CAPSULE | Freq: Two times a day (BID) | ORAL | 0 refills | Status: DC

## 2020-05-10 MED ORDER — MECLIZINE HCL 25 MG PO TABS
12.5000 mg | ORAL_TABLET | Freq: Three times a day (TID) | ORAL | Status: DC | PRN
  Administered 2020-05-10: 12.5 mg via ORAL
  Filled 2020-05-10: qty 1

## 2020-05-10 MED ORDER — METHOCARBAMOL 500 MG PO TABS: 500.0000 mg | ORAL_TABLET | Freq: Four times a day (QID) | ORAL | 0 refills | Status: DC | PRN

## 2020-05-10 NOTE — Progress Notes (Signed)
Patient has verbalized throughout shift pain discomfort to left hip that started after showering on yesterday, Noted difficulty in standing while during simple task with mobility and positioning herself in bed, Medicated with prn x2 during the shift, some relief verbalized this morning during rounding.Continue to monitor and assisted, will inform on coming nursing regarding this concern.

## 2020-05-10 NOTE — Progress Notes (Signed)
Physical Therapy Session Note  Patient Details  Name: [name] MRN: [id number] Date of Birth: 23-Jun-1931  Today's Date: 05/10/2020 PT Individual Time: 1300-1340 PT Individual Time Calculation (min): 40 min   Short Term Goals: Week 1:  PT Short Term Goal 1 (Week 1): STG=LTG due to ELOS  Skilled Therapeutic Interventions/Progress Updates:     Patient in bed upon PT arrival. Patient reported 9/10 headache and 8/10 L hip/buttock pain during session, RN made aware and premedicated patient prior to session. PT provided repositioning, rest breaks, and distraction as pain interventions throughout session. PT had spoken with both of the patient's daughter's prior to session. Discussed patient's family's concerns about d/c due to increased pain and limited family support from patient's husband. Patient in agreement, states that her husband will not provide 24/7 supervision and that he will need to leave the house to run errands daily. Patient also stated that she was concerned about going home due to her increased headache and hip pain and she reports a decreased tolerance for activity today. She also is concerned about the dizziness, nausea, and new reports of double vision that she experiences with mobility. Per earlier PT session, patient did perform worse on outcome measures assessed than she did on admission. Patient sat EOB required HOB elevated, use of bed rail and min A for trunk and LE support due to pain. Patient tolerated sitting EOB <5 min then requested to lie back down. Patient declined standing and reported increased head and neck pain and dizziness in sitting. Returned to supine with min A and significantly increased time due to pain.   Vitals:  Supine: BP 132/81, HR 81 Sitting: BP 156/84, HR 85, MAP 102 Supine: BP 153/83, HR 84, MAP 104  Charge nurse, PA, and MD made aware of patient's vitals, symptoms, decreased functional status, and concerns about safe d/c tomorrow. Will follow up  with rehab team about d/c planning.   Patient in bed at end of session with breaks locked, bed alarm set, and all needs within reach.    Therapy Documentation Precautions:  Precautions Precautions: Fall Restrictions Weight Bearing Restrictions: No    Therapy/Group: Individual Therapy  Kaiyu Mirabal L Deandrew Hoecker PT, DPT  05/10/2020, 3:49 PM

## 2020-05-10 NOTE — Plan of Care (Signed)
°  Problem: Consults Goal: RH BRAIN INJURY PATIENT EDUCATION Description: Description: See Patient Education module for eduction specifics Outcome: Progressing   Problem: RH SKIN INTEGRITY Goal: RH STG MAINTAIN SKIN INTEGRITY WITH ASSISTANCE Description: STG Maintain Skin Integrity With mod I Assistance. Outcome: Progressing   Problem: RH COGNITION-NURSING Goal: RH STG ANTICIPATES NEEDS/CALLS FOR ASSIST W/ASSIST/CUES Description: STG Anticipates Needs/Calls for Assist With mod I Assistance/Cues. Outcome: Progressing   Problem: RH PAIN MANAGEMENT Goal: RH STG PAIN MANAGED AT OR BELOW PT'S PAIN GOAL Description: Less than 5 out of 10 Outcome: Progressing   Problem: RH KNOWLEDGE DEFICIT BRAIN INJURY Goal: RH STG INCREASE KNOWLEDGE OF SELF CARE AFTER BRAIN INJURY Description: Increase knowledge of self care and medication regimen through education from staff with [place] assist.  Outcome: Progressing

## 2020-05-10 NOTE — Progress Notes (Signed)
Patient ID: [name], female   DOB: Feb 04, 1931, 86 y.o.   MRN: [id number]  SW received updates from medical team, pt will not d/c today due to medical reasons, and will d/c on Monday. SW called pt dtr [name] [phone]) as she requested a return phone call. SW provided above updates. SW informed there will be updates on d/c recommendations.    Loralee Pacas, MSW, [place] Office: [phone] Cell: [phone] Fax: [phone]

## 2020-05-10 NOTE — Progress Notes (Signed)
Occupational Therapy Note  Patient Details  Name: [name] MRN: [id number] Date of Birth: Apr 20, 1931  Today's Date: 05/10/2020 OT Missed Time: 60 Minutes Missed Time Reason: Pain;Patient fatigue  Pt received supine in bed reporting pain upon movement in hip of RLE and fatigue. Pt's BP at 136/73. Discussed pt's status with RN and notified RN of pain. Pt supine in bed at end of session with call bell within reach and bed alarm on.  Niranjan Rufener 05/10/2020, 2:34 PM

## 2020-05-10 NOTE — Progress Notes (Addendum)
[facility] PHYSICAL MEDICINE & REHABILITATION PROGRESS NOTE  Subjective/Complaints: Showering with therapy this morning.  Complained of some left hip pain this morning. Vitals are stable. Appreciate [name] vertigo eval yesterday: has signs of vertebrobasilar insufficiency  ROS:  Pt denies SOB, abd pain, CP, N/V/C/D, and vision changes  Objective: Vital Signs: Blood pressure 119/74, pulse 71, temperature 98.1 F (36.7 C), resp. rate 20, height 5\' 2"  (1.575 m), weight 51.3 kg, SpO2 98 %. No results found. Recent Labs    05/08/20 1000  WBC 10.1  HGB 10.9*  HCT 33.6*  PLT 308   Recent Labs    05/08/20 1000  NA 130*  K 4.0  CL 92*  CO2 27  GLUCOSE 128*  BUN 16  CREATININE 0.96  CALCIUM 9.1    Physical Exam: BP 119/74 (BP Location: Left Arm)   Pulse 71   Temp 98.1 F (36.7 C)   Resp 20   Ht 5\' 2"  (1.575 m)   Wt 51.3 kg   SpO2 98%   BMI 20.69 kg/m  General: Alert and oriented x 3, No apparent distress HEENT: Head is normocephalic, atraumatic, PERRLA, EOMI, sclera anicteric, oral mucosa pink and moist, dentition intact, ext ear canals clear,  Neck: Supple without JVD or lymphadenopathy Heart: Reg rate and rhythm. No murmurs rubs or gallops Chest: CTA bilaterally without wheezes, rales, or rhonchi; no distress Abdomen: Soft, non-tender, non-distended, bowel sounds positive. Extremities: No clubbing, cyanosis, or edema. Pulses are 2+ Skin: See above.  Intact.  Warm and dry. Psych: poor  Memory- c/p back pain Musc: No edema in extremities.  No tenderness in extremities. Neuro: Alert, appropriate Makes eye contact with examiner.   She follows simple commands.   Motor: Grossly 5/5 throughout, unchanged. Ambulating with walker back to bed  Assessment/Plan: 1. Functional deficits secondary to TBI which require 3+ hours per day of interdisciplinary therapy in a comprehensive inpatient rehab setting.  Physiatrist is providing close team supervision and 24 hour  management of active medical problems listed below.  Physiatrist and rehab team continue to assess barriers to discharge/monitor patient progress toward functional and medical goals  Care Tool:  Bathing    Body parts bathed by patient: Face         Bathing assist Assist Level: Minimal Assistance - Patient > 75%     Upper Body Dressing/Undressing Upper body dressing Upper body dressing/undressing activity did not occur (including orthotics): N/A What is the patient wearing?: Pull over shirt    Upper body assist Assist Level: Contact Guard/Touching assist    Lower Body Dressing/Undressing Lower body dressing    Lower body dressing activity did not occur: N/A What is the patient wearing?: Pants     Lower body assist Assist for lower body dressing: Contact Guard/Touching assist     Toileting Toileting    Toileting assist Assist for toileting: Contact Guard/Touching assist     Transfers Chair/bed transfer  Transfers assist  Chair/bed transfer activity did not occur: N/A  Chair/bed transfer assist level: Contact Guard/Touching assist     Locomotion Ambulation   Ambulation assist      Assist level: Contact Guard/Touching assist Assistive device: Walker-rolling Max distance: 95'   Walk 10 feet activity   Assist     Assist level: Contact Guard/Touching assist Assistive device: Walker-rolling   Walk 50 feet activity   Assist    Assist level: Contact Guard/Touching assist Assistive device: Walker-rolling    Walk 150 feet activity   Assist  Assist level: Minimal Assistance - Patient > 75% Assistive device: No Device    Walk 10 feet on uneven surface  activity   Assist     Assist level: Minimal Assistance - Patient > 75%     Wheelchair     Assist Will patient use wheelchair at discharge?: No (patient is a functional ambulator)   Wheelchair activity did not occur: N/A         Wheelchair 50 feet with 2 turns  activity    Assist    Wheelchair 50 feet with 2 turns activity did not occur: N/A       Wheelchair 150 feet activity     Assist Wheelchair 150 feet activity did not occur: N/A          Medical Problem List and Plan: 1.  Decreased functional mobility secondary to traumatic bilateral frontal SAH with small amount of right occipital subarachnoid hemorrhage as well as nondisplaced superolateral orbital fracture  Team conference 7/27  -Continue CIR 2.  Antithrombotics: -DVT/anticoagulation: Subcutaneous heparin- d/c as hemoglobin is dropping and walking>250 feet             -antiplatelet therapy: N/A 3. Pain Management: Robaxin 500 mg every 6 hours and oxycodone as needed  7/29: Left hip pain after abducting yesterday. Present in buttock, feels like a severe ache. Ordered muscle rub. Monitor for allergic reaction.  4. Mood: Patient on Celexa prior to admission.             -antipsychotic agents: N/A 5. Neuropsych: This patient is capable of making decisions on her own behalf. 6. Skin/Wound Care: Routine skin checks 7. Fluids/Electrolytes/Nutrition: Routine in and outs. 8.  Seizure prophylaxis.    Keppra 500 mg twice daily x 7 days.  EEG negative 9.  Hypertension.  Norvasc 2.5 mg daily.          7/29: BP is labile- continue to monitor.  10.  Question syncope.  Carotid Dopplers negative.  Echocardiogram unremarkable.  Monitor with increased mobility 11.  Hypothyroidism.  TSH 4.079.    Continue Synthroid 12.  Hyperlipidemia.  Continue Crestor. 13. CKD stage III.  Latest creatinine 1.03.               Creatinine 0.92 on 7/24, up to 1.15 on 7/26, improved on 7/27.  Encourage hydration 14.  Acute blood loss anemia             Hemoglobin 11.1 on 7/24, down to 9.5 on 7/26, improved on 7/27 15. Constipation  7/26- only 1 small BM in last 7 days- will order sorbitol and prn enema if needed 16. Vertebrobasilar insufficiency: will schedule outpatient neurology follow-up. Will start  Meclizine.  17. Disposition: Discussed with daughter [name].   >35 minutes spent in examination of patient, discussion and management of left hip pain, discussion with daughter, discussion with Cherie PT, discussion with PA Linna Hoff and SW Auria, assessment of BP.    LOS: 5 days A FACE TO FACE EVALUATION WAS PERFORMED  [name] 05/10/2020, 10:16 AM

## 2020-05-10 NOTE — Progress Notes (Signed)
Speech Language Pathology Daily Session Note  Patient Details  Name: MALINA GEERS MRN: [id number] Date of Birth: 11/22/1930  Today's Date: 05/10/2020 SLP Individual Time: 1431-1446 SLP Individual Time Calculation (min): 15 min  Short Term Goals: Week 1: SLP Short Term Goal 1 (Week 1): STG=LTG due to ELOS  Skilled Therapeutic Interventions: Pt was seen for skilled ST targeting education. Although family education was scheduled and attempted with husband, he was not present. Pt stated that he had decided not to attend and she is not confident in his ability to provide 24/7 supervision. Pt demonstrated improvements in anticipatory awareness and safety awareness in functional conversation regarding d/c plans today. Pt also with significant headache and discharge has been extended until Monday 8/4. SLP started to review compensatory memory strategies with pt, however x-ray technician arrived to take pt off unit and she missed remaining 15 minutes of skilled ST session. Pt left laying in bed with alarm on and x-ray tech present. Continue per current plan of care.          Pain Pain Assessment Pain Scale: Faces Faces Pain Scale: Hurts whole lot Pain Type: Acute pain Pain Location: Head Pain Descriptors / Indicators: Headache Pain Onset: On-going Pain Intervention(s): Other (Comment);Distraction (RN already administered medication)  Therapy/Group: Individual Therapy  Arbutus Leas 05/10/2020, 1:58 PM

## 2020-05-10 NOTE — Progress Notes (Signed)
Physical Therapy Discharge Summary  Patient Details  Name: [name] MRN: [id number] Date of Birth: 14-Jun-1931  Today's Date: 05/14/2020   Patient has met 0 of 12 long term goals due to improved activity tolerance, improved balance, improved postural control, increased strength, increased range of motion, decreased pain, ability to compensate for deficits, improved attention and improved awareness.  Patient to discharge at an ambulatory level min guard assist-close supervision.   Patient's care partner is independent to provide the necessary physical and cognitive assistance at discharge.  Reasons goals not met: Patient with fluctuating mobility and limited progress due to various locations of significant pain (head, neck, hip, back) and increased disorientation/confusion throughout stay. Patient most consistent with CGA-close supervision for household mobility. Family participated in family education and comfortable with providing this level of assist at this time and prepared to take patient home.   Recommendation:  Patient will benefit from ongoing skilled PT services in outpatient setting to continue to advance safe functional mobility, address ongoing impairments in balance, functional mobility, activity tolerance, gait and stair training, safety awareness, attention, patient/caregiver education, and minimize fall risk.  Equipment: RW  Reasons for discharge: treatment goals met  Patient/family agrees with progress made and goals achieved: Yes   PT Discharge Precautions/Restrictions Precautions Precautions: Fall Restrictions Weight Bearing Restrictions: No Vision/Perception  Vision - Assessment Eye Alignment: Within Functional Limits Alignment/Gaze Preference: Within Defined Limits Tracking/Visual Pursuits: Able to track stimulus in all quads without difficulty Saccades: Within functional limits Perception Perception: Within Functional Limits Praxis Praxis: Intact   Cognition Overall Cognitive Status: Impaired/Different from baseline Arousal/Alertness: Awake/alert Attention: Sustained Sustained Attention: Impaired Sustained Attention Impairment: Verbal basic;Functional basic Memory: Impaired Memory Impairment: Decreased short term memory Decreased Short Term Memory: Verbal complex;Functional complex Awareness: Impaired Awareness Impairment: Anticipatory impairment Problem Solving: Impaired Problem Solving Impairment: Functional complex;Verbal complex Executive Function: Self Monitoring;Self Correcting Self Monitoring: Impaired Self Monitoring Impairment: Functional complex Self Correcting: Impaired Self Correcting Impairment: Functional complex Behaviors: Impulsive Safety/Judgment: Impaired Comments: Impaired self awareness and safety awareness [employer] Scales of Cognitive Functioning: Purposeful/appropriate Sensation Sensation Light Touch: Appears Intact (does report intermittent numbness of fingertips and toes at baseline, no complaint of symptoms during admission) Proprioception: Appears Intact Coordination Gross Motor Movements are Fluid and Coordinated: No Fine Motor Movements are Fluid and Coordinated: Yes Coordination and Movement Description: Some generalized weakness still present, as well as balance deficits Motor  Motor Motor: Other (comment) Motor - Discharge Observations: Generalized weakness and instability  Mobility Bed Mobility Bed Mobility: Supine to Sit;Sit to Supine Supine to Sit: Independent Sit to Supine: Independent Transfers Transfers: Sit to Stand;Stand Pivot Transfers;Stand to Sit Sit to Stand: Supervision/Verbal cueing Stand to Sit: Supervision/Verbal cueing Stand Pivot Transfers: Supervision/Verbal cueing Transfer (Assistive device): None Locomotion  Gait Ambulation: Yes Gait Assistance: Contact Guard/Touching assist Gait Distance (Feet): 150 Feet Assistive device: 1 person hand held  assist Gait Gait: Yes Gait Pattern: Decreased stride length;Step-through pattern;Decreased hip/knee flexion - right;Decreased hip/knee flexion - left;Decreased trunk rotation;Narrow base of support Stairs / Additional Locomotion Stairs: Yes Stairs Assistance: Contact Guard/Touching assist Stair Management Technique: One rail Right Number of Stairs: 4 Height of Stairs: 6 Ramp: Contact Guard/touching assist Curb: Contact Guard/Touching assist (with use of hand rails)  Trunk/Postural Assessment  Cervical Assessment Cervical Assessment: Within Functional Limits Thoracic Assessment Thoracic Assessment: Within Functional Limits Postural Control Postural Control: Deficits on evaluation Righting Reactions: delayed/decreased Protective Responses: delayed/decreased  Balance Balance Balance Assessed: Yes Static Sitting Balance Static Sitting - Balance  Support: Feet supported Static Sitting - Level of Assistance: 7: Independent Dynamic Sitting Balance Dynamic Sitting - Balance Support: Feet supported Dynamic Sitting - Level of Assistance: 5: Stand by assistance Static Standing Balance Static Standing - Balance Support: During functional activity;No upper extremity supported Static Standing - Level of Assistance: 5: Stand by assistance Dynamic Standing Balance Dynamic Standing - Balance Support: During functional activity;Right upper extremity supported;Left upper extremity supported Dynamic Standing - Level of Assistance: 5: Stand by assistance Extremity Assessment  RLE Assessment Active Range of Motion (AROM) Comments: WFL for all functional mobility General Strength Comments: Grossly 4+ to 5/5 throughout LLE Assessment LLE Assessment: Within Functional Limits Active Range of Motion (AROM) Comments: WFL for all functional mobility General Strength Comments: Grossly 4+ to 5/5 throughout    Agata Lucente L Rosa Gambale PT, DPT  05/14/2020, 5:50 PM

## 2020-05-10 NOTE — Progress Notes (Signed)
Occupational Therapy Session Note  Patient Details  Name: [name] MRN: [id number] Date of Birth: 18-Aug-1931  Today's Date: 05/10/2020 OT Individual Time: 5670-1410 OT Individual Time Calculation (min): 63 min    Short Term Goals: Week 1:  OT Short Term Goal 1 (Week 1): STG=LTG d/t ELOS  Skilled Therapeutic Interventions/Progress Updates:    Treatment session with focus on ADL retraining and functional mobility. Pain in RLE reported, repositioned. Pt received semi-reclined in bed requesting shower. Pt reported dizziness upon sitting EOB, with pt's BP 170/75. Pt reported pain in in R hip upon sitting at EOB, stating "As soon as I get moving I'll feel better." Ambulated with RW with supervision to TTB. Pt reported Showered with supervision, reporting pain in RLE upon sit <> stands. Repostioned pt. Educated on use of a long-handled sponge to decrease forward reach while bathing BLE. Pt reported decreased dizziness. Ambulated to bed with close supervision with RW with pt requiring increased time due to reported pain. Pt dressed at EOB with modI. MD present to discuss pt's symptoms and provide recommendations. Returned to bed, pt with bed alarm on, call bell within reach.  Therapy Documentation Precautions:  Precautions Precautions: Fall Restrictions Weight Bearing Restrictions: No General:   Vital Signs: BP 170/75  Pain:  Pain in RLE reported, repositioned.  Therapy/Group: Individual Therapy  Michelle Nasuti 05/10/2020, 2:05 PM

## 2020-05-10 NOTE — Progress Notes (Signed)
Physical Therapy Session Note  Patient Details  Name: [name] MRN: [id number] Date of Birth: 02/12/1931  Today's Date: 05/10/2020 PT Individual Time: 0804-0830 PT Individual Time Calculation (min): 26 min   Short Term Goals: Week 1:  PT Short Term Goal 1 (Week 1): STG=LTG due to ELOS  Skilled Therapeutic Interventions/Progress Updates:   Pt received supine in bed and agreeable to PT. Supine>sit transfer with supervision assist and increased time due to L hip pain 2/10. Pt performed 5xSTS: 20 sec (average of 2 trials; >15 sec indicates increased fall risk)   Patient demonstrates increased fall risk as noted by score of  35 /56 on Berg Balance Scale.  (<36= high risk for falls, close to 100%; 37-45 significant >80%; 46-51 moderate >50%; 52-55 lower >25%)   Sit>supine completed with supervision assist, and left supine in bed with call bell in reach and all needs met.        Therapy Documentation Precautions:  Precautions Precautions: Fall Restrictions Weight Bearing Restrictions: No Vital Signs: Therapy Vitals Temp: 98.1 F (36.7 C) Pulse Rate: 71 Resp: 20 BP: 119/74 Patient Position (if appropriate): Lying Oxygen Therapy SpO2: 98 % O2 Device: Room Air Pain: Pain Assessment Pain Scale: 0-10 Pain Score: 0-No pain  Balance: Balance Balance Assessed: Yes Standardized Balance Assessment Standardized Balance Assessment: Berg Balance Test (of note, pt reports pain in the L hip limiting abaility to stand on the LLE on this day) Berg Balance Test Sit to Stand: Able to stand without using hands and stabilize independently Standing Unsupported: Able to stand 2 minutes with supervision Sitting with Back Unsupported but Feet Supported on Floor or Stool: Able to sit safely and securely 2 minutes Stand to Sit: Sits safely with minimal use of hands Transfers: Able to transfer safely, definite need of hands Standing Unsupported with Eyes Closed: Able to stand 10 seconds with  supervision Standing Ubsupported with Feet Together: Needs help to attain position but able to stand for 30 seconds with feet together From Standing, Reach Forward with Outstretched Arm: Can reach confidently >25 cm (10") From Standing Position, Pick up Object from Floor: Able to pick up shoe, needs supervision From Standing Position, Turn to Look Behind Over each Shoulder: Turn sideways only but maintains balance Turn 360 Degrees: Needs assistance while turning Standing Unsupported, Alternately Place Feet on Step/Stool: Able to complete >2 steps/needs minimal assist Standing Unsupported, One Foot in Front: Needs help to step but can hold 15 seconds Standing on One Leg: Tries to lift leg/unable to hold 3 seconds but remains standing independently Total Score: 34   Therapy/Group: Individual Therapy   E  05/10/2020, 8:31 AM  

## 2020-05-11 ENCOUNTER — Inpatient Hospital Stay (HOSPITAL_COMMUNITY): Payer: Medicare Other | Admitting: Physical Therapy

## 2020-05-11 ENCOUNTER — Inpatient Hospital Stay (HOSPITAL_COMMUNITY): Payer: Medicare Other

## 2020-05-11 MED ORDER — METHOCARBAMOL 500 MG PO TABS
500.0000 mg | ORAL_TABLET | Freq: Three times a day (TID) | ORAL | Status: DC
  Administered 2020-05-11 – 2020-05-12 (×2): 500 mg via ORAL
  Filled 2020-05-11 (×2): qty 1

## 2020-05-11 MED ORDER — TOPIRAMATE 25 MG PO TABS: 25.0000 mg | ORAL_TABLET | Freq: Every day | ORAL | Status: DC | PRN

## 2020-05-11 MED ORDER — METHOCARBAMOL 500 MG PO TABS
500.0000 mg | ORAL_TABLET | Freq: Once | ORAL | Status: AC
  Administered 2020-05-11: 500 mg via ORAL

## 2020-05-11 NOTE — Progress Notes (Signed)
Occupational Therapy Session Note  Patient Details  Name: [name] MRN: [id number] Date of Birth: February 05, 1931  Today's Date: 05/11/2020 OT Individual Time: 1300-1400 OT Individual Time Calculation (min): 60 min    Short Term Goals: Week 1:  OT Short Term Goal 1 (Week 1): STG=LTG d/t ELOS  Skilled Therapeutic Interventions/Progress Updates:    1:1. Pt received in bed with 10/10 neck pain in bed and with mobility but pt wants to get  Up and walk. Pt requires MIN A for trunk elevation to EOB and sits EOB while OT enduactes on gentle shoulder rolls to loosen up neck. Pt completes funcitonla mobility with RW in hall way with VC for RW maangment to/from tx gym with seated rest break on mat Pt requesting to return to room and requesting muscle rub on neck, RN alerted. Pt sit>supine with A to mange LLE into bed. Pt repeating over and over, "I need something supporting the back of my neck" nearly hyperventilating. OT educates on slow deep diaphramatic breathing after providing towel roll and writing sign to place towel roll and hot pack behind neck for pain management iwht mild releif reported. Exited session with pt seated in bed, exit alram on and call light in reach  Therapy Documentation Precautions:  Precautions Precautions: Fall Restrictions Weight Bearing Restrictions: No General:   Vital Signs:  Pain:   ADL: ADL Eating: Set up Where Assessed-Eating: Edge of bed Grooming: Modified independent Where Assessed-Grooming: Standing at sink Upper Body Bathing: Modified independent Where Assessed-Upper Body Bathing: Shower Lower Body Bathing: Modified independent Where Assessed-Lower Body Bathing: Shower Upper Body Dressing: Modified independent (Device) Where Assessed-Upper Body Dressing: Chair Lower Body Dressing: Modified independent Where Assessed-Lower Body Dressing: Chair Toileting: Modified independent Where Assessed-Toileting: Glass blower/designer: Midwife Method: Counselling psychologist: Energy manager: Distant supervision Social research officer, government Method: Heritage manager: Civil engineer, contracting with back Glass blower/designer   Exercises:   Other Treatments:     Therapy/Group: Individual Therapy  Tonny Branch 05/11/2020, 2:00 PM

## 2020-05-11 NOTE — Plan of Care (Signed)
°  Problem: Consults Goal: RH BRAIN INJURY PATIENT EDUCATION Description: Description: See Patient Education module for eduction specifics Outcome: Progressing   Problem: RH BOWEL ELIMINATION Goal: RH STG MANAGE BOWEL WITH ASSISTANCE Description: STG Manage Bowel with mod I Assistance. Outcome: Progressing Goal: RH STG MANAGE BOWEL W/MEDICATION W/ASSISTANCE Description: STG Manage Bowel with Medication with mod I Assistance. Outcome: Progressing   Problem: RH SKIN INTEGRITY Goal: RH STG MAINTAIN SKIN INTEGRITY WITH ASSISTANCE Description: STG Maintain Skin Integrity With mod I Assistance. Outcome: Progressing   Problem: RH COGNITION-NURSING Goal: RH STG ANTICIPATES NEEDS/CALLS FOR ASSIST W/ASSIST/CUES Description: STG Anticipates Needs/Calls for Assist With mod I Assistance/Cues. Outcome: Progressing   Problem: RH PAIN MANAGEMENT Goal: RH STG PAIN MANAGED AT OR BELOW PT'S PAIN GOAL Description: Less than 5 out of 10 Outcome: Progressing   Problem: RH KNOWLEDGE DEFICIT BRAIN INJURY Goal: RH STG INCREASE KNOWLEDGE OF SELF CARE AFTER BRAIN INJURY Description: Increase knowledge of self care and medication regimen through education from staff with [place] assist.  Outcome: Progressing

## 2020-05-11 NOTE — Progress Notes (Signed)
Patient ID: [name], female   DOB: [birth year], 86 y.o.   MRN: [id number]  SW followed up with pt dtr [name] [phone]) to provide updates on d/c recommendations: RW and OPT PT/OT/SLP. SW encouraged plans for being here to pick up pt on Monday.   SW faxed Opt referral to [facility] (p:[phone]/f:[phone]).   Loralee Pacas, MSW, [place] Office: [phone] Cell: [phone] Fax: [phone]

## 2020-05-11 NOTE — Progress Notes (Signed)
Physical Therapy Session Note  Patient Details  Name: [name] MRN: [id number] Date of Birth: December 21, 1930  Today's Date: 05/11/2020 PT Individual Time: 9937-1696  PT Individual Time Calculation (min): 57 min and refused 45 min 2/2 pain.  Short Term Goals: Week 1:  PT Short Term Goal 1 (Week 1): STG=LTG due to ELOS  Skilled Therapeutic Interventions/Progress Updates:   First session:Pt presents supine in bed and agreeable to therapy, although concerned about laxative taken.  Pt then states need to use BR.  Pt transfers sup to sit w/ min A and use of siderails.  Pt required min A initially to stand, but decreased assist throughout session to CGA and occ verbal cueing for hand placement.  Pt perseverating on neck and L hip pain of 10/10.  Pt amb to BR w/ CGA and RW.  Pt required 3 trips to BR w/ diarrhea, NT aware and charting.  Pt able to clean peri area seated.  Pt states w/ improved constipation, hip pain decreasing.  Pt able to stand and perform clothing management w/ CGA and increased time.  Pt amb to sink to wash hands each trial.  Pt returned to recliner and shoes donned.  Pt required use of BR again and then returned to bed after same assist for care.  Pt transferred sit to supine w/ CGA.  Pt wishes to remain left side-lying d/t c/o neck pain.  Bed alarm on and all needs in reach.  Second session: Pt presents supine in bed, c/o 10/10 pain to neck.  Pt has had towel roll, hot pack and cold pack w/o relief.  Pt unable to participate w/ therapy at this time.  Discussed w/ RN, states pain meds given and will re-assess later.     Therapy Documentation Precautions:  Precautions Precautions: Fall Restrictions Weight Bearing Restrictions: No General:   Vital Signs:  Pain: 10/10 in neck, left hip, but decreasing in hip during session.      Therapy/Group: Individual Therapy  [name] 05/11/2020, 12:16 PM

## 2020-05-11 NOTE — Progress Notes (Signed)
[place] [facility] PHYSICAL MEDICINE & REHABILITATION PROGRESS NOTE  Subjective/Complaints: [name] complains of bilateral posterior neck pain. It does not radiate to her shoulder or head. She has been using heating pad without much relief. If she stays still and does not move her head then she feels better.  Vitals stable.   ROS:  Pt denies SOB, abd pain, CP, N/V/C/D, and vision changes  Objective: Vital Signs: Blood pressure (!) 120/63, pulse 70, temperature 98.5 F (36.9 C), resp. rate 17, height 5\' 2"  (1.575 m), weight 51.3 kg, SpO2 100 %. DG HIP UNILAT WITH PELVIS 2-3 VIEWS LEFT  Result Date: 05/10/2020 CLINICAL DATA:  Left hip pain.  Fall Sunday at church. EXAM: DG HIP (WITH OR WITHOUT PELVIS) 2-3V LEFT COMPARISON:  None. FINDINGS: The cortical margins of the bony pelvis and left hip are intact. No fracture. Pubic symphysis and sacroiliac joints are congruent. Pubic rami are intact. Both femoral heads are well-seated in the respective acetabula. No significant hip joint space narrowing. IMPRESSION: No fracture of the pelvis or left hip. Electronically Signed   By: Keith Rake M.D.   On: 05/10/2020 16:14   No results for input(s): WBC, HGB, HCT, PLT in the last 72 hours. No results for input(s): NA, K, CL, CO2, GLUCOSE, BUN, CREATININE, CALCIUM in the last 72 hours.  Physical Exam: BP (!) 120/63   Pulse 70   Temp 98.5 F (36.9 C)   Resp 17   Ht 5\' 2"  (1.575 m)   Wt 51.3 kg   SpO2 100%   BMI 20.69 kg/m  General: Alert and oriented x 3, No apparent distress HEENT: Head is normocephalic, atraumatic, PERRLA, EOMI, sclera anicteric, oral mucosa pink and moist, dentition intact, ext ear canals clear,  Neck: Supple without JVD or lymphadenopathy Heart: Reg rate and rhythm. No murmurs rubs or gallops Chest: CTA bilaterally without wheezes, rales, or rhonchi; no distress Abdomen: Soft, non-tender, non-distended, bowel sounds positive. Extremities: No clubbing, cyanosis, or edema.  Pulses are 2+ Skin: See above.  Intact.  Warm and dry. Psych: poor  Memory- c/p back pain Musc: No edema in extremities.  No tenderness in extremities. Neuro: Alert, appropriate Makes eye contact with examiner.   She follows simple commands.   Motor: Grossly 5/5 throughout, unchanged. Ambulating with walker back to bed  Assessment/Plan: 1. Functional deficits secondary to TBI which require 3+ hours per day of interdisciplinary therapy in a comprehensive inpatient rehab setting.  Physiatrist is providing close team supervision and 24 hour management of active medical problems listed below.  Physiatrist and rehab team continue to assess barriers to discharge/monitor patient progress toward functional and medical goals  Care Tool:  Bathing    Body parts bathed by patient: Face         Bathing assist Assist Level: Minimal Assistance - Patient > 75%     Upper Body Dressing/Undressing Upper body dressing Upper body dressing/undressing activity did not occur (including orthotics): N/A What is the patient wearing?: Pull over shirt    Upper body assist Assist Level: Contact Guard/Touching assist    Lower Body Dressing/Undressing Lower body dressing    Lower body dressing activity did not occur: N/A What is the patient wearing?: Pants     Lower body assist Assist for lower body dressing: Contact Guard/Touching assist     Toileting Toileting    Toileting assist Assist for toileting: Contact Guard/Touching assist     Transfers Chair/bed transfer  Transfers assist  Chair/bed transfer activity did not occur:  N/A  Chair/bed transfer assist level: Contact Guard/Touching assist     Locomotion Ambulation   Ambulation assist      Assist level: Contact Guard/Touching assist Assistive device: Walker-rolling Max distance: 15'   Walk 10 feet activity   Assist     Assist level: Contact Guard/Touching assist (pt given sorbitol and unable to leave room.) Assistive  device: Walker-rolling   Walk 50 feet activity   Assist    Assist level: Contact Guard/Touching assist Assistive device: Walker-rolling    Walk 150 feet activity   Assist    Assist level: Minimal Assistance - Patient > 75% Assistive device: No Device    Walk 10 feet on uneven surface  activity   Assist     Assist level: Minimal Assistance - Patient > 75%     Wheelchair     Assist Will patient use wheelchair at discharge?: No (patient is a functional ambulator)   Wheelchair activity did not occur: N/A         Wheelchair 50 feet with 2 turns activity    Assist    Wheelchair 50 feet with 2 turns activity did not occur: N/A       Wheelchair 150 feet activity     Assist Wheelchair 150 feet activity did not occur: N/A          Medical Problem List and Plan: 1.  Decreased functional mobility secondary to traumatic bilateral frontal SAH with small amount of right occipital subarachnoid hemorrhage as well as nondisplaced superolateral orbital fracture  Team conference 7/27  -Continue CIR 2.  Antithrombotics: -DVT/anticoagulation: Subcutaneous heparin- d/c as hemoglobin is dropping and walking>250 feet             -antiplatelet therapy: N/A 3. Pain Management: Robaxin 500 mg every 6 hours and oxycodone as needed  7/29: Left hip pain after abducting yesterday. Present in buttock, feels like a severe ache. Ordered muscle rub. Monitor for allergic reaction.   7/30: Pain in hip is improving. Reviewed XR with patient- no abnormalities. Heating pad to neck and one time Robaxin now  4. Mood: Patient on Celexa prior to admission.             -antipsychotic agents: N/A 5. Neuropsych: This patient is capable of making decisions on her own behalf. 6. Skin/Wound Care: Routine skin checks 7. Fluids/Electrolytes/Nutrition: Routine in and outs. 8.  Seizure prophylaxis.    Keppra 500 mg twice daily x 7 days.  EEG negative 9.  Hypertension.  Norvasc 2.5 mg  daily.        7/30: BP has been labile- continue current regimen.  10.  Question syncope.  Carotid Dopplers negative.  Echocardiogram unremarkable.  Monitor with increased mobility 11.  Hypothyroidism.  TSH 4.079.    Continue Synthroid 12.  Hyperlipidemia.  Continue Crestor. 13. CKD stage III.  Latest creatinine 1.03.               Creatinine 0.92 on 7/24, up to 1.15 on 7/26, improved on 7/27.  Encourage hydration 14.  Acute blood loss anemia             Hemoglobin 11.1 on 7/24, down to 9.5 on 7/26, improved on 7/27 15. Constipation  7/26- only 1 small BM in last 7 days- will order sorbitol and prn enema if needed  7/30: Add prune juice with meals 16. Vertebrobasilar insufficiency: will schedule outpatient neurology follow-up. Will start Meclizine.  17. Disposition: Discussed with daughter [name].    LOS: 6 days  A FACE TO FACE EVALUATION WAS PERFORMED  Clide Deutscher Tanairy Payeur 05/11/2020, 11:44 AM

## 2020-05-11 NOTE — Plan of Care (Signed)
ALL GOALS DOWNGRADED  D/T PROGRESS AND NEED FOR CUES FOR SAFETY/SEQUENCING  Problem: RH Balance Goal: LTG Patient will maintain dynamic standing with ADLs (OT) Description: LTG:  Patient will maintain dynamic standing balance with assist during activities of daily living (OT)  Flowsheets (Taken 05/11/2020 1703) LTG: Pt will maintain dynamic standing balance during ADLs with: Supervision/Verbal cueing   Problem: RH Bathing Goal: LTG Patient will bathe all body parts with assist levels (OT) Description: LTG: Patient will bathe all body parts with assist levels (OT) Flowsheets (Taken 05/11/2020 1703) LTG: Pt will perform bathing with assistance level/cueing: Supervision/Verbal cueing   Problem: RH Dressing Goal: LTG Patient will perform lower body dressing w/assist (OT) Description: LTG: Patient will perform lower body dressing with assist, with/without cues in positioning using equipment (OT) Flowsheets (Taken 05/11/2020 1703) LTG: Pt will perform lower body dressing with assistance level of: Supervision/Verbal cueing   Problem: RH Toileting Goal: LTG Patient will perform toileting task (3/3 steps) with assistance level (OT) Description: LTG: Patient will perform toileting task (3/3 steps) with assistance level (OT)  Flowsheets (Taken 05/11/2020 1703) LTG: Pt will perform toileting task (3/3 steps) with assistance level: Supervision/Verbal cueing   Problem: RH Toilet Transfers Goal: LTG Patient will perform toilet transfers w/assist (OT) Description: LTG: Patient will perform toilet transfers with assist, with/without cues using equipment (OT) Flowsheets (Taken 05/11/2020 1703) LTG: Pt will perform toilet transfers with assistance level of: Supervision/Verbal cueing   Problem: RH Tub/Shower Transfers Goal: LTG Patient will perform tub/shower transfers w/assist (OT) Description: LTG: Patient will perform tub/shower transfers with assist, with/without cues using equipment (OT) Flowsheets  (Taken 05/11/2020 1703) LTG: Pt will perform tub/shower stall transfers with assistance level of: Supervision/Verbal cueing

## 2020-05-11 NOTE — Progress Notes (Signed)
Patient's LBM recorded on 05/09/20.  However, patient reports feeling of constipation.  Sorbitol 70% oral solution 30 mL administered per patient's request.  Result pending at the time of this writing.  Mazzy Santarelli Montey Hora, MSN, RN, CNL IP Rehab

## 2020-05-11 NOTE — Progress Notes (Signed)
Occupational Therapy Session Note  Patient Details  Name: [name] MRN: [id number] Date of Birth: 1931-08-05  Today's Date: 05/11/2020 OT Individual Time: 0700-0745 OT Individual Time Calculation (min): 45 min    Short Term Goals: Week 1:  OT Short Term Goal 1 (Week 1): STG=LTG d/t ELOS  Skilled Therapeutic Interventions/Progress Updates:    1:1. Pt received in bed agreeable to OT with pt repeating self about 7x in the first 10 min about receiving "oral enema" (sorbitol)/answers to other questions without answering OTs questions. Pt vitals assessed EOB: 154/80 MAP 99 HR 86 and O2% 100. Pt finishes breakfast at EOB and agreeable to changing clothing. Pt attempting to open water bottle that is already open. Pt requries A to pull night gown up back leaning laterally d/t hip pain. Pt able to thread BLE into pants with VC for threading LLE first into brief, able ot recall strategy for pants. Pt requries VC for scooting to EOB prior to sit to stand nad MIN A for power up at EOB for brief and S for pants with VC for hand palcement for power up STS transition. Pt reporting L neck pain and nausea in standing. Vitals assessed and RN alerted 142/85 HR 84 and O2 100%. Oral care completed seated EOB iwht pt requring direct VC for sequencing. Alerted RN for seeming difference in mental status as well. Exited session with pt seated in bed, exit alarm on and call light in reach  Therapy Documentation Precautions:  Precautions Precautions: Fall Restrictions Weight Bearing Restrictions: No General:   Vital Signs: Therapy Vitals Temp: 98.5 F (36.9 C) Pulse Rate: 70 Resp: 17 BP: (!) 120/63 Patient Position (if appropriate): Lying Oxygen Therapy SpO2: 100 % Pain: Pain Assessment Pain Scale: 0-10 Pain Score: 8  Pain Type: Acute pain Pain Location: Hip Pain Orientation: Left Pain Intervention(s): Medication (See eMAR) ADL: ADL Eating: Set up Where Assessed-Eating: Edge of bed Grooming:  Modified independent Where Assessed-Grooming: Standing at sink Upper Body Bathing: Modified independent Where Assessed-Upper Body Bathing: Shower Lower Body Bathing: Modified independent Where Assessed-Lower Body Bathing: Shower Upper Body Dressing: Modified independent (Device) Where Assessed-Upper Body Dressing: Chair Lower Body Dressing: Modified independent Where Assessed-Lower Body Dressing: Chair Toileting: Modified independent Where Assessed-Toileting: Glass blower/designer: Diplomatic Services operational officer Method: Counselling psychologist: Energy manager: Distant supervision Social research officer, government Method: Heritage manager: Civil engineer, contracting with back Glass blower/designer   Exercises:   Other Treatments:     Therapy/Group: Individual Therapy  Tonny Branch 05/11/2020, 7:05 AM

## 2020-05-12 ENCOUNTER — Encounter (HOSPITAL_COMMUNITY): Payer: Medicare Other | Admitting: Occupational Therapy

## 2020-05-12 ENCOUNTER — Inpatient Hospital Stay (HOSPITAL_COMMUNITY): Payer: Medicare Other

## 2020-05-12 ENCOUNTER — Ambulatory Visit (HOSPITAL_COMMUNITY): Payer: Medicare Other

## 2020-05-12 DIAGNOSIS — R569 Unspecified convulsions: Secondary | ICD-10-CM

## 2020-05-12 MED ORDER — METAXALONE 800 MG PO TABS
800.0000 mg | ORAL_TABLET | Freq: Three times a day (TID) | ORAL | Status: DC
  Administered 2020-05-12 – 2020-05-13 (×3): 800 mg via ORAL
  Filled 2020-05-12 (×5): qty 1

## 2020-05-12 NOTE — Progress Notes (Signed)
EEG Completed; Results Pending  

## 2020-05-12 NOTE — Progress Notes (Signed)
Physical Therapy Session Note  Patient Details  Name: [name] MRN: [id number] Date of Birth: [birth year]  Today's Date: 05/12/2020 PT Individual Time: 0840-0950 PT Individual Time Calculation (min): 70 min   Short Term Goals: Week 1:  PT Short Term Goal 1 (Week 1): STG=LTG due to ELOS  Skilled Therapeutic Interventions/Progress Updates:     Patient in bed with her daughters at bedside upon PT arrival. Patient alert and agreeable to PT session. Patient reported 7-8/10 posterior head and neck pain at beginning of session, RN and MD made aware. PT provided repositioning, rest breaks, and distraction as pain interventions throughout session.   Her daughters expressed several concerns about patient's cognitive status, noting changes over the last several days, also discussed patient's increased pain in her head, neck, and L hip. Patient reports decreased activity tolerance and ambulation over the past few days as well. Performed gross neuro assessment, patient is oriented x4, inconsistent with sensation testing, suspect due to decreased cognition, finger to nose testing WFL, and strength grossly 4-5/5 throughout. Patient also reporting that "the people" are back and following her. Patient's daughter report that she has had paranoia of people following her in the past and was taking something prescribed by her PCP that helped, MD made aware.   Vitals:  Supine: BP 134/69, HR 90, MAP 100 Sitting: BP 143/101, HR 97, MAP 114 (reported dizziness that resolved <30 sec) Standing: BP 126/88, HR 79, MAP 99  Therapeutic Activity: Bed Mobility: Patient performed rolling R/L with mod-max A due to pain and supine to/from sit with mod A with use of hospital bed features. Provided verbal cues for performing log rolling to reduce neck pain. Patient unable to tolerate bed mobility without pulling on therapist to roll and come to sitting. Transfers: Patient performed sit to/from stand x1 with mod A and stand  pivot bed<>BSC with mod A of 1 and min A from her daughter for steadying assist. Provided verbal cues for hand placement, forward weight shift, and sequncing. Patient unable to ambulate in standing today due to decreased balance and increased pain in standing. Patient was continent of bladder on [facility] and required increased time to perform peri-care due to difficulty finding a comfortable position on the [facility].  Patient required increased time and encouragement with all mobility and increased assistance due to pain and decreased balance today. Family reports that she will not have this level of assistance at home at d/c and has concerns about planned d/c for Monday.  MD made aware.  Patient in bed with her daughter's at bedside at end of session with breaks locked, bed alarm set, and all needs within reach.    Therapy Documentation Precautions:  Precautions Precautions: Fall Restrictions Weight Bearing Restrictions: No    Therapy/Group: Individual Therapy  Tavin Vernet [name] PT, DPT  05/12/2020, 12:21 PM

## 2020-05-12 NOTE — Progress Notes (Addendum)
[facility] PHYSICAL MEDICINE & REHABILITATION PROGRESS NOTE  Subjective/Complaints:   Per PT< pt has been declining in function since first few days- and was mod A for toilet transfer today- having increased pain turning her head.   Said hurts on back of head and low back- which according to her 2 daughters, pt never c/o pain.   Also they note pt very sensitive to meds- want her started on Pamelor for headache prevention- also want Neurology consult because pt is more confused.   Note that pt was started on Robaxin a few days ago and oxycodone for pain, which can increase confusion- changed Robaxin scheduled to Skelaxin for muscle relaxants scheduled.   Also having paranoia per family- said to PT, which she had rarely at home- but notes before admission, was very active, drove herself and don't think pain is due to therapy/exercise.   Per PT, also did vestibular testing- and thinks she might have vertebral basilar insufficiency based on vestibular testing- did not continue with testing as a result.   Again, family emphatic want a Neurology consult for pt- or transferred to [facility]- I did get head CT to f/u to make sure SDH has improved-  She has no new bleeding or new stroke seen.    ROS:   Pt denies SOB, abd pain, CP, N/V/C/D, and vision changes  Objective: Vital Signs: Blood pressure (!) 133/76, pulse 83, temperature 98.4 F (36.9 C), resp. rate 17, height 5\' 2"  (1.575 m), weight 51.3 kg, SpO2 100 %. CT HEAD WO CONTRAST  Result Date: 05/12/2020 CLINICAL DATA:  Subarachnoid hemorrhage.  Confusion and headaches. EXAM: CT HEAD WITHOUT CONTRAST TECHNIQUE: Contiguous axial images were obtained from the base of the skull through the vertex without intravenous contrast. COMPARISON:  04/29/2020 and 04/30/2020. FINDINGS: Brain: Age related atrophy. Chronic small-vessel ischemic changes of the cerebral hemispheric white matter. Near complete resolution of any visible hyperdense subarachnoid  blood. Very small amount in the frontal regions. No sign of intraparenchymal hemorrhage or subdural hemorrhage. Left tentorial calcification as previously seen. No hydrocephalus. Vascular: There is atherosclerotic calcification of the major vessels at the base of the brain. Skull: Negative Sinuses/Orbits: Clear/normal Other: None IMPRESSION: Resolution of visible hyperdense subarachnoid blood. No new bleeding. No subdural hematoma. No hydrocephalus or acute stroke. Atrophy and chronic small-vessel ischemic changes of the white matter. Electronically Signed   By: Nelson Chimes M.D.   On: 05/12/2020 12:26   DG HIP UNILAT WITH PELVIS 2-3 VIEWS LEFT  Result Date: 05/10/2020 CLINICAL DATA:  Left hip pain.  Fall Sunday at church. EXAM: DG HIP (WITH OR WITHOUT PELVIS) 2-3V LEFT COMPARISON:  None. FINDINGS: The cortical margins of the bony pelvis and left hip are intact. No fracture. Pubic symphysis and sacroiliac joints are congruent. Pubic rami are intact. Both femoral heads are well-seated in the respective acetabula. No significant hip joint space narrowing. IMPRESSION: No fracture of the pelvis or left hip. Electronically Signed   By: Keith Rake M.D.   On: 05/10/2020 16:14   No results for input(s): WBC, HGB, HCT, PLT in the last 72 hours. No results for input(s): NA, K, CL, CO2, GLUCOSE, BUN, CREATININE, CALCIUM in the last 72 hours.  Physical Exam: BP (!) 133/76   Pulse 83   Temp 98.4 F (36.9 C)   Resp 17   Ht 5\' 2"  (1.575 m)   Wt 51.3 kg   SpO2 100%   BMI 20.69 kg/m  General: alert most of the time, but then  perseverates or changes topic without logical progression- daughter there when seen her 2nd time, NAD HEENT: raccoon eyes almost healed- conjugate gaze Neck: very tight Scalenes- middle and posterior as well as SCM R>L- very TTP occiput B/L and splenius capitus which is also tight B/L Heart: RRRR Chest: CTA B/L- no W/R/R- good air movement Abdomen: Soft, NT, ND, (+)BS   Extremities: No clubbing, cyanosis, or edema. Pulses are 2+ Skin: See above.  Intact.  Warm and dry. Psych: poor memory overall, but has flashes of appropriateness Musc: No edema in extremities.  No tenderness in extremities. Neuro: Alert, appropriate- as above   strength in UEs appears equal when checked initially Motor: Grossly 5/5 throughout, unchanged. Now mod A to transfer to [facility].   Assessment/Plan: 1. Functional deficits secondary to TBI which require 3+ hours per day of interdisciplinary therapy in a comprehensive inpatient rehab setting.  Physiatrist is providing close team supervision and 24 hour management of active medical problems listed below.  Physiatrist and rehab team continue to assess barriers to discharge/monitor patient progress toward functional and medical goals  Care Tool:  Bathing    Body parts bathed by patient: Face         Bathing assist Assist Level: Minimal Assistance - Patient > 75%     Upper Body Dressing/Undressing Upper body dressing Upper body dressing/undressing activity did not occur (including orthotics): N/A What is the patient wearing?: Pull over shirt    Upper body assist Assist Level: Contact Guard/Touching assist    Lower Body Dressing/Undressing Lower body dressing    Lower body dressing activity did not occur: N/A What is the patient wearing?: Pants     Lower body assist Assist for lower body dressing: Contact Guard/Touching assist     Toileting Toileting    Toileting assist Assist for toileting: Contact Guard/Touching assist     Transfers Chair/bed transfer  Transfers assist  Chair/bed transfer activity did not occur: N/A  Chair/bed transfer assist level: 2 Helpers     Locomotion Ambulation   Ambulation assist      Assist level: Contact Guard/Touching assist Assistive device: Walker-rolling Max distance: 15'   Walk 10 feet activity   Assist     Assist level: Contact Guard/Touching assist (pt  given sorbitol and unable to leave room.) Assistive device: Walker-rolling   Walk 50 feet activity   Assist    Assist level: Contact Guard/Touching assist Assistive device: Walker-rolling    Walk 150 feet activity   Assist    Assist level: Minimal Assistance - Patient > 75% Assistive device: No Device    Walk 10 feet on uneven surface  activity   Assist     Assist level: Minimal Assistance - Patient > 75%     Wheelchair     Assist Will patient use wheelchair at discharge?: No (patient is a functional ambulator)   Wheelchair activity did not occur: N/A         Wheelchair 50 feet with 2 turns activity    Assist    Wheelchair 50 feet with 2 turns activity did not occur: N/A       Wheelchair 150 feet activity     Assist Wheelchair 150 feet activity did not occur: N/A          Medical Problem List and Plan: 1.  Decreased functional mobility secondary to traumatic bilateral frontal SAH with small amount of right occipital subarachnoid hemorrhage as well as nondisplaced superolateral orbital fracture  Team conference 7/27  -Continue CIR  2.  Antithrombotics: -DVT/anticoagulation: Subcutaneous heparin- d/c as hemoglobin is dropping and walking>250 feet             -antiplatelet therapy: N/A 3. Pain Management: Robaxin 500 mg every 6 hours and oxycodone as needed  7/29: Left hip pain after abducting yesterday. Present in buttock, feels like a severe ache. Ordered muscle rub. Monitor for allergic reaction.   7/30: Pain in hip is improving. Reviewed XR with patient- no abnormalities. Heating pad to neck and one time Robaxin now   7/31- will d/c robaxin which can cause sedation/cognitive issues and start Skelaxin 800 mg TID- con't oxycodone for now as order, but asked family to try to stick to tylenol.  4. Mood: Patient on Celexa prior to admission.             -antipsychotic agents: N/A 5. Neuropsych: This patient is capable of making decisions on  her own behalf. 6. Skin/Wound Care: Routine skin checks 7. Fluids/Electrolytes/Nutrition: Routine in and outs. 8.  Seizure prophylaxis.    Keppra 500 mg twice daily x 7 days.  EEG negative 9.  Hypertension.  Norvasc 2.5 mg daily.        7/30: BP has been labile- continue current regimen.  10.  Question syncope.  Carotid Dopplers negative.  Echocardiogram unremarkable.  Monitor with increased mobility 11.  Hypothyroidism.  TSH 4.079.    Continue Synthroid  7/31- on home dose of meds 50 mcg daily- verified with pt and family 62.  Hyperlipidemia.  Continue Crestor. 13. CKD stage III.  Latest creatinine 1.03.               Creatinine 0.92 on 7/24, up to 1.15 on 7/26, improved on 7/27.  Encourage hydration 14.  Acute blood loss anemia             Hemoglobin 11.1 on 7/24, down to 9.5 on 7/26, improved on 7/27 15. Constipation  7/26- only 1 small BM in last 7 days- will order sorbitol and prn enema if needed  7/30: Add prune juice with meals 16. Vertebrobasilar insufficiency on clinical vertigo testing: will schedule outpatient neurology follow-up. Will start Meclizine.  17. Cognitive impairment and functional decline  7/31- think could be from Robaxin around the clock and oxycodone- daughters said pt sensitive to meds- head CT looks much better- spoke with Neuro - they suggested an EEG, esp since daughters mentioned 2 episodes of shaking/tremors "all over" last 1 this AM. Last EEG (-)-    I saw pt this AM for 20 minutes- went back and spent another 30 minutes with daughters- then called Neuro- an additional 10 minutes on phone- so spent 1 hour on care today.  Of note, daughters said if we don't get Neuro consult, they want pt transferred to [facility]- wasn't clear on Pt's relationship to [facility] center.     LOS: 7 days A FACE TO FACE EVALUATION WAS PERFORMED  Glynn Freas 05/12/2020, 12:48 PM

## 2020-05-12 NOTE — Progress Notes (Signed)
Occupational Therapy Session Note  Patient Details  Name: [name] MRN: [id number] Date of Birth: August 28, 1931  Today's Date: 05/12/2020 OT Group Time:  - 60 minutes missed    Skilled Therapeutic Interventions/Progress Updates:    Pt declining group intervention today due to pain and presence of family. 60 minutes missed.    Therapy Documentation Precautions:  Precautions Precautions: Fall Restrictions Weight Bearing Restrictions: No Vital Signs: Therapy Vitals Temp: 98.4 F (36.9 C) Pulse Rate: 83 Resp: 17 BP: (!) 133/76 Patient Position (if appropriate): Lying Oxygen Therapy SpO2: 100 % Pain: Pain Assessment Pain Scale: 0-10 Pain Score: 8  Pain Type: Acute pain Pain Location: Neck Pain Intervention(s): Medication (See eMAR) ADL: ADL Eating: Set up Where Assessed-Eating: Edge of bed Grooming: Modified independent Where Assessed-Grooming: Standing at sink Upper Body Bathing: Modified independent Where Assessed-Upper Body Bathing: Shower Lower Body Bathing: Modified independent Where Assessed-Lower Body Bathing: Shower Upper Body Dressing: Modified independent (Device) Where Assessed-Upper Body Dressing: Chair Lower Body Dressing: Modified independent Where Assessed-Lower Body Dressing: Chair Toileting: Modified independent Where Assessed-Toileting: Glass blower/designer: Diplomatic Services operational officer Method: Counselling psychologist: Energy manager: Distant supervision Social research officer, government Method: Heritage manager: Shower seat with back      Therapy/Group: Group Therapy  [employer] 05/12/2020, 7:20 AM

## 2020-05-12 NOTE — Plan of Care (Signed)
  Problem: Consults Goal: RH BRAIN INJURY PATIENT EDUCATION Description: Description: See Patient Education module for eduction specifics Outcome: Progressing   Problem: RH BOWEL ELIMINATION Goal: RH STG MANAGE BOWEL WITH ASSISTANCE Description: STG Manage Bowel with mod I Assistance. Outcome: Progressing Goal: RH STG MANAGE BOWEL W/MEDICATION W/ASSISTANCE Description: STG Manage Bowel with Medication with mod I Assistance. Outcome: Progressing   Problem: RH SKIN INTEGRITY Goal: RH STG MAINTAIN SKIN INTEGRITY WITH ASSISTANCE Description: STG Maintain Skin Integrity With mod I Assistance. Outcome: Progressing   Problem: RH COGNITION-NURSING Goal: RH STG ANTICIPATES NEEDS/CALLS FOR ASSIST W/ASSIST/CUES Description: STG Anticipates Needs/Calls for Assist With mod I Assistance/Cues. Outcome: Progressing   Problem: RH PAIN MANAGEMENT Goal: RH STG PAIN MANAGED AT OR BELOW PT'S PAIN GOAL Description: Less than 5 out of 10 Outcome: Progressing   Problem: RH KNOWLEDGE DEFICIT BRAIN INJURY Goal: RH STG INCREASE KNOWLEDGE OF SELF CARE AFTER BRAIN INJURY Description: Increase knowledge of self care and medication regimen through education from staff with [place] assist.  Outcome: Progressing

## 2020-05-12 NOTE — Procedures (Signed)
Patient Name: [name]  MRN: [id number]  Epilepsy Attending: Lora Havens  Referring Physician/Provider: Dr Courtney Heys Date: 05/12/2020  Duration: 23.20 mins  Patient history: 86yo F with worsening mental status and 2 episodes of shaking/tremors all over. EEG to evaluate for seizure.  Level of alertness: Awake  AEDs during EEG study: None  Technical aspects: This EEG study was done with scalp electrodes positioned according to the 10-20 International system of electrode placement. Electrical activity was acquired at a sampling rate of 500Hz  and reviewed with a high frequency filter of 70Hz  and a low frequency filter of 1Hz . EEG data were recorded continuously and digitally stored.   Description: No posterior dominant rhythm was seen. EEG showed continuous generalized 3 to 6 Hz theta-delta slowing as well as triphasic waves, generalized , maximal bifrontal.  Hyperventilation and photic stimulation were not performed.     ABNORMALITY -Continuous slow, generalized -Triphasic waves, generalized  IMPRESSION: This study is suggestive of moderate diffuse encephalopathy, nonspecific etiology but could be related to toxic-metabolic etiology. No seizures or definite epileptiform discharges were seen throughout the recording.  [name]

## 2020-05-13 ENCOUNTER — Inpatient Hospital Stay (HOSPITAL_COMMUNITY): Payer: Medicare Other | Admitting: Physical Therapy

## 2020-05-13 ENCOUNTER — Inpatient Hospital Stay (HOSPITAL_COMMUNITY): Payer: Medicare Other

## 2020-05-13 MED ORDER — METAXALONE 800 MG PO TABS
800.0000 mg | ORAL_TABLET | Freq: Three times a day (TID) | ORAL | Status: DC | PRN
  Filled 2020-05-13: qty 1

## 2020-05-13 NOTE — Plan of Care (Signed)
  Problem: Consults Goal: RH BRAIN INJURY PATIENT EDUCATION Description: Description: See Patient Education module for eduction specifics Outcome: Progressing   Problem: RH BOWEL ELIMINATION Goal: RH STG MANAGE BOWEL WITH ASSISTANCE Description: STG Manage Bowel with mod I Assistance. Outcome: Progressing Goal: RH STG MANAGE BOWEL W/MEDICATION W/ASSISTANCE Description: STG Manage Bowel with Medication with mod I Assistance. Outcome: Progressing   Problem: RH SKIN INTEGRITY Goal: RH STG MAINTAIN SKIN INTEGRITY WITH ASSISTANCE Description: STG Maintain Skin Integrity With mod I Assistance. Outcome: Progressing   Problem: RH COGNITION-NURSING Goal: RH STG ANTICIPATES NEEDS/CALLS FOR ASSIST W/ASSIST/CUES Description: STG Anticipates Needs/Calls for Assist With mod I Assistance/Cues. Outcome: Progressing   Problem: RH PAIN MANAGEMENT Goal: RH STG PAIN MANAGED AT OR BELOW PT'S PAIN GOAL Description: Less than 5 out of 10 Outcome: Progressing   Problem: RH KNOWLEDGE DEFICIT BRAIN INJURY Goal: RH STG INCREASE KNOWLEDGE OF SELF CARE AFTER BRAIN INJURY Description: Increase knowledge of self care and medication regimen through education from staff with [place] assist.  Outcome: Progressing

## 2020-05-13 NOTE — Progress Notes (Signed)
Occupational Therapy Session Note  Patient Details  Name: [name] MRN: [id number] Date of Birth: 17-Jun-1931  Today's Date: 05/13/2020 OT Individual Time: 3825-0539 OT Individual Time Calculation (min): 60 min    Short Term Goals: Week 1:  OT Short Term Goal 1 (Week 1): STG=LTG d/t ELOS  Skilled Therapeutic Interventions/Progress Updates:    Pt received sitting EOB, alert and oriented and much more chipper compared to notes from previous days, stating she is feeling much better. No c/o pain. Pt eager to take shower. RN entered to administer morning medication, which pt took EOB. Pt stood from EOB with (S) and completed ambulatory transfer into the shower with close (S), supporting herself on walls occasionally. Pt doffed socks with figure 4 method- no mention of hip pain. Pt washed hair seated, using an excessive amount of shampoo, stating this is her baseline. Pt completed UB bathing with mod I. Discussed use of pill box to help manage new medications. Pt stood to wash LB, good use of grab bar but min cueing to remain facing forward and to use removable nozzle to rinse. Pt ambulated to recliner and donned shirt with set up assist only. (S) to don pants and underwear. Pt with questions re specifics of [place], pt read emergency room notes, but unable to provide anymore specifics. Pt stood at the sink and completed oral care and hair care with distant (S). Pt completed toileting with (S), ambulating into bathroom. Pt was left sitting up in the recliner with chair alarm set and all needs met.   Therapy Documentation Precautions:  Precautions Precautions: Fall Restrictions Weight Bearing Restrictions: No   Therapy/Group: Individual Therapy  Curtis Sites 05/13/2020, 7:22 AM

## 2020-05-13 NOTE — Progress Notes (Addendum)
[name] PROGRESS NOTE  Subjective/Complaints:  Pt reports that she's doing MUCH better this AM- denies ANY pain- said they are allowing her to transfer "her way" and that's much better- also noted she got confused on time at 1am last night and called daughter thinking it was 1pm since a lot of light from hallway and "lots of noise"- woke daughter up- recognizes what occurred and didn't call again.   Feeling great- denies ANY pain.    ROS:   Pt denies SOB, abd pain, CP, N/V/C/D, and vision changes  Objective: Vital Signs: Blood pressure 126/68, pulse 83, temperature 98 F (36.7 C), resp. rate 18, height 5\' 2"  (1.575 m), weight 50.8 kg, SpO2 98 %. CT HEAD WO CONTRAST  Result Date: 05/12/2020 CLINICAL DATA:  Subarachnoid hemorrhage.  Confusion and headaches. EXAM: CT HEAD WITHOUT CONTRAST TECHNIQUE: Contiguous axial images were obtained from the base of the skull through the vertex without intravenous contrast. COMPARISON:  04/29/2020 and 04/30/2020. FINDINGS: Brain: Age related atrophy. Chronic small-vessel ischemic changes of the cerebral hemispheric white matter. Near complete resolution of any visible hyperdense subarachnoid blood. Very small amount in the frontal regions. No sign of intraparenchymal hemorrhage or subdural hemorrhage. Left tentorial calcification as previously seen. No hydrocephalus. Vascular: There is atherosclerotic calcification of the major vessels at the base of the brain. Skull: Negative Sinuses/Orbits: Clear/normal Other: None IMPRESSION: Resolution of visible hyperdense subarachnoid blood. No new bleeding. No subdural hematoma. No hydrocephalus or acute stroke. Atrophy and chronic small-vessel ischemic changes of the white matter. Electronically Signed   By: Nelson Chimes M.D.   On: 05/12/2020 12:26   EEG adult  Result Date: 05/12/2020 Lora Havens, MD     05/12/2020  5:08 PM Patient Name: [name] MRN: [id number] Epilepsy  Attending: Lora Havens Referring Physician/Provider: Dr Courtney Heys Date: 05/12/2020 Duration: 23.20 mins Patient history: 86yo F with worsening mental status and 2 episodes of shaking/tremors all over. EEG to evaluate for seizure. Level of alertness: Awake AEDs during EEG study: None Technical aspects: This EEG study was done with scalp electrodes positioned according to the 10-20 International system of electrode placement. Electrical activity was acquired at a sampling rate of 500Hz  and reviewed with a high frequency filter of 70Hz  and a low frequency filter of 1Hz . EEG data were recorded continuously and digitally stored. Description: No posterior dominant rhythm was seen. EEG showed continuous generalized 3 to 6 Hz theta-delta slowing as well as triphasic waves, generalized , maximal bifrontal.  Hyperventilation and photic stimulation were not performed.   ABNORMALITY -Continuous slow, generalized -Triphasic waves, generalized IMPRESSION: This study is suggestive of moderate diffuse encephalopathy, nonspecific etiology but could be related to toxic-metabolic etiology. No seizures or definite epileptiform discharges were seen throughout the recording. [name]   No results for input(s): WBC, HGB, HCT, PLT in the last 72 hours. No results for input(s): NA, K, CL, CO2, GLUCOSE, BUN, CREATININE, CALCIUM in the last 72 hours.  Physical Exam: BP 126/68 (BP Location: Right Arm)   Pulse 83   Temp 98 F (36.7 C)   Resp 18   Ht 5\' 2"  (1.575 m)   Wt 50.8 kg   SpO2 98%   BMI 20.48 kg/m  General: much more awake, alert this AM- much more appropriate, alone, NAD HEENT: raccoon eyes almost healed- conjugate gaze- no change Neck: much improved scalenes, and esp SCMs which were sticking out yesterday are not today.  Heart: RRR Chest:  CTA B/L- no W/R/R- good air movement Abdomen: Soft, NT, ND, (+)BS  Extremities: No clubbing, cyanosis, or edema. Pulses are 2+ Skin: See above.  Intact.  Warm  and dry. Psych: memory MUCH better this AM and reasoning appears much more intact Musc: No edema in extremities.  No tenderness in extremities. Neuro: Alert, appropriate- as above Motor: Grossly 5/5 throughout, unchanged. Moving all 4 extremities equally-   Assessment/Plan: 1. Functional deficits secondary to TBI which require 3+ hours per day of interdisciplinary therapy in a comprehensive inpatient rehab setting.  Physiatrist is providing close team supervision and 24 hour management of active medical problems listed below.  Physiatrist and rehab team continue to assess barriers to discharge/monitor patient progress toward functional and medical goals  Care Tool:  Bathing    Body parts bathed by patient: Face, Right arm, Right lower leg, Chest, Left arm, Left lower leg, Abdomen, Front perineal area, Buttocks, Right upper leg, Left upper leg         Bathing assist Assist Level: Set up assist     Upper Body Dressing/Undressing Upper body dressing Upper body dressing/undressing activity did not occur (including orthotics): N/A What is the patient wearing?: Pull over shirt    Upper body assist Assist Level: Set up assist    Lower Body Dressing/Undressing Lower body dressing    Lower body dressing activity did not occur: N/A What is the patient wearing?: Pants, Underwear/pull up     Lower body assist Assist for lower body dressing: Supervision/Verbal cueing     Toileting Toileting    Toileting assist Assist for toileting: Supervision/Verbal cueing     Transfers Chair/bed transfer  Transfers assist  Chair/bed transfer activity did not occur: N/A  Chair/bed transfer assist level: Supervision/Verbal cueing     Locomotion Ambulation   Ambulation assist      Assist level: Contact Guard/Touching assist Assistive device: Walker-rolling Max distance: 15'   Walk 10 feet activity   Assist     Assist level: Contact Guard/Touching assist (pt given sorbitol  and unable to leave room.) Assistive device: Walker-rolling   Walk 50 feet activity   Assist    Assist level: Contact Guard/Touching assist Assistive device: Walker-rolling    Walk 150 feet activity   Assist    Assist level: Minimal Assistance - Patient > 75% Assistive device: No Device    Walk 10 feet on uneven surface  activity   Assist     Assist level: Minimal Assistance - Patient > 75%     Wheelchair     Assist Will patient use wheelchair at discharge?: No (patient is a functional ambulator)   Wheelchair activity did not occur: N/A         Wheelchair 50 feet with 2 turns activity    Assist    Wheelchair 50 feet with 2 turns activity did not occur: N/A       Wheelchair 150 feet activity     Assist Wheelchair 150 feet activity did not occur: N/A          Medical Problem List and Plan: 1.  Decreased functional mobility secondary to traumatic bilateral frontal SAH with small amount of right occipital subarachnoid hemorrhage as well as nondisplaced superolateral orbital fracture  Team conference 7/27  -Continue CIR 2.  Antithrombotics: -DVT/anticoagulation: Subcutaneous heparin- d/c as hemoglobin is dropping and walking>250 feet             -antiplatelet therapy: N/A 3. Pain Management: Robaxin 500 mg every 6 hours and  oxycodone as needed  7/29: Left hip pain after abducting yesterday. Present in buttock, feels like a severe ache. Ordered muscle rub. Monitor for allergic reaction.   7/30: Pain in hip is improving. Reviewed XR with patient- no abnormalities. Heating pad to neck and one time Robaxin now   7/31- will d/c robaxin which can cause sedation/cognitive issues and start Skelaxin 800 mg TID- con't oxycodone for now as order, but asked family to try to stick to tylenol.  8/1- MUCH better with "transferring her way" as well as scheduled skelaxin- tightness much improved and pain 0/10 per pt this AM and since yesterday evening.   4.  Mood: Patient on Celexa prior to admission.             -antipsychotic agents: N/A 5. Neuropsych: This patient is capable of making decisions on her own behalf. 6. Skin/Wound Care: Routine skin checks 7. Fluids/Electrolytes/Nutrition: Routine in and outs. 8.  Seizure prophylaxis.    Keppra 500 mg twice daily x 7 days.  EEG negative 9.  Hypertension.  Norvasc 2.5 mg daily.       8/1- much improved- 120s-130s SBP- con't regimen 10.  Question syncope.  Carotid Dopplers negative.  Echocardiogram unremarkable.  Monitor with increased mobility 11.  Hypothyroidism.  TSH 4.079.    Continue Synthroid  7/31- on home dose of meds 50 mcg daily- verified with pt and family 58.  Hyperlipidemia.  Continue Crestor. 13. CKD stage III.  Latest creatinine 1.03.               Creatinine 0.92 on 7/24, up to 1.15 on 7/26, improved on 7/27.  Encourage hydration 14.  Acute blood loss anemia             Hemoglobin 11.1 on 7/24, down to 9.5 on 7/26, improved on 7/27 15. Constipation  7/26- only 1 small BM in last 7 days- will order sorbitol and prn enema if needed  7/30: Add prune juice with meals 16. Vertebrobasilar insufficiency on clinical vertigo testing: will schedule outpatient neurology follow-up. Will start Meclizine.  17. Cognitive impairment and functional decline  7/31- think could be from Robaxin around the clock and oxycodone- daughters said pt sensitive to meds- head CT looks much better- spoke with Neuro - they suggested an EEG, esp since daughters mentioned 2 episodes of shaking/tremors "all over" last 1 this AM. Last EEG (-)-   8/1- EEG (-) for seizures- has some moderate encephalopathy which was done yesterday- could be pain meds- head CT looked great- pt's pain has resolved per pt this AM and much more with it/awake/alert- will con't regimen for now- if family asks, can call Neurology consult, but when we spoke yesterday they said only calll if not improved.   8/1- Saw pt in gym later on- per  daughter and husband, pt looks great, however felt like "going downhill after skelaxin"- so want it as needed.  Also, therapy wasn't sure if pt going home tomorrow or not- asked them to put on schedule for tomorrow just in case- however pt seen walking in hallway at least 75 ft with gait belt CGA at most- looked great.  Pt likely can d/c Monday if everything continues to go well.  Spent a total of 40 minutes on total care today with seeing pt 2x and speaking with PT and family.       LOS: 8 days A FACE TO FACE EVALUATION WAS PERFORMED  Tyria Springer 05/13/2020, 11:22 AM

## 2020-05-13 NOTE — Progress Notes (Signed)
Physical Therapy Session Note  Patient Details  Name: [name] MRN: [id number] Date of Birth: 10-14-1930  Today's Date: 05/13/2020 PT Individual Time: 1308-1410 PT Individual Time Calculation (min): 62 min   Short Term Goals: Week 1:  PT Short Term Goal 1 (Week 1): STG=LTG due to ELOS  Skilled Therapeutic Interventions/Progress Updates:    Pt received sitting in recliner with daughter, husband, and RN present. Pt pleasant and agreeable to therapy session but does state she is "ready to go home." Pt/family reports she is feeling much better today and requests to speak with MD regarding specific medications - MD notified and present during session to clarify. Pt's family agreeable to participate in session for education/training. Therapist educated them on providing close supervision/CGA for safety/steadying during all mobility. Sit<>stands with close supervision. Gait ~135ft to main therapy gym with HHA for steadying (per pt request stating she has items close to her in the home that she can use for steadying) with pt demoing slight increased postural sway/path deviation. Educated family on properly guarding on stairs. Ascended/descended 4 steps using R HR per home set-up x2 with therapist then family providing close supervision/CGA - husband demoing correct guarding- pt automatically alternating between step-to or reciprocal stepping pattern as needed. Ambulatory car transfer (SUV/truck height), no AD, with pt's husband providing close supervision/CGA for safety/steadying. Ambulated ~74ft up/down ramp using intermittent UE support on railling with CGA for steadying.  Ambulated ~148ft back to main gym with 1x L LOB into the wall for recovery - discussed pt's potential need for cane or RW but pt declines stating she will trip over it. Educate pt/family on fall risk safety and when to call 911 as well as proper floor transfer technique. Pt attempted floor transfer and was able to get into quadruped on  the floor mat but pt unable to get into lying position therefore returned to sitting on EOM with min assist for safety. Pt reports this caused her to become slightly out of breath and a slight tightness in her lower lungs - pt's daughter reports pt had COVID at the beginning of the year that could be contributing to her breathing and pt reports she has hx of sometimes getting this tightness feeling - provided seated rest break until symptoms dissipated. Dynamic gait training ~110ft, no AD, working on sudden turning and stopping to replicate more real life ambulation with CGA/min assist for safety/steadying. Gait ~129ft back to room, no AD, with close supervision/guarding on L side. Pt left sitting in recliner with needs in reach, B LEs elevated, and chair alarm on.  Therapy Documentation Precautions:  Precautions Precautions: Fall Restrictions Weight Bearing Restrictions: No  Pain: Denies pain during session.   Therapy/Group: Individual Therapy  Tawana Scale , PT, DPT, CSRS  05/13/2020, 12:58 PM

## 2020-05-14 MED ORDER — METAXALONE 800 MG PO TABS: 400.0000 mg | ORAL_TABLET | Freq: Two times a day (BID) | ORAL | 0 refills | Status: DC | PRN

## 2020-05-14 MED ORDER — MECLIZINE HCL 12.5 MG PO TABS: 12.5000 mg | ORAL_TABLET | Freq: Three times a day (TID) | ORAL | 0 refills | Status: DC | PRN

## 2020-05-14 NOTE — Plan of Care (Signed)
°  Problem: Consults Goal: [facility] BRAIN INJURY PATIENT EDUCATION Description: Description: See Patient Education module for eduction specifics Outcome: Progressing   Problem: RH BOWEL ELIMINATION Goal: RH STG MANAGE BOWEL WITH ASSISTANCE Description: STG Manage Bowel with mod I Assistance. Outcome: Progressing Goal: RH STG MANAGE BOWEL W/MEDICATION W/ASSISTANCE Description: STG Manage Bowel with Medication with mod I Assistance. Outcome: Progressing   Problem: RH SKIN INTEGRITY Goal: RH STG MAINTAIN SKIN INTEGRITY WITH ASSISTANCE Description: STG Maintain Skin Integrity With mod I Assistance. Outcome: Progressing   Problem: RH COGNITION-NURSING Goal: RH STG ANTICIPATES NEEDS/CALLS FOR ASSIST W/ASSIST/CUES Description: STG Anticipates Needs/Calls for Assist With mod I Assistance/Cues. Outcome: Progressing   Problem: RH PAIN MANAGEMENT Goal: RH STG PAIN MANAGED AT OR BELOW PT'S PAIN GOAL Description: Less than 5 out of 10 Outcome: Progressing   Problem: RH KNOWLEDGE DEFICIT BRAIN INJURY Goal: RH STG INCREASE KNOWLEDGE OF SELF CARE AFTER BRAIN INJURY Description: Increase knowledge of self care and medication regimen through education from staff with [place] assist.  Outcome: Progressing

## 2020-05-14 NOTE — Discharge Instructions (Signed)
Inpatient Rehab Discharge Instructions  SHYANNA KLINGEL Discharge date and time: 05/14/20   Activities/Precautions/ Functional Status: Activity: activity as tolerated Diet: regular diet Wound Care: none needed    Functional status:  ___ No restrictions     ___ Walk up steps independently _X__ 24/7 supervision/assistance   ___ Walk up steps with assistance ___ Intermittent supervision/assistance  ___ Bathe/dress independently ___ Walk with walker     _x__ Bathe/dress with assistance ___ Walk Independently    ___ Shower independently __X_ Walk with assistance    ___ Shower with assistance ___ No alcohol     ___ Return to work/school ________   COMMUNITY REFERRALS UPON DISCHARGE:    Outpatient: PT     OT   ST             Agency: [facility]       Phone: [phone]             Appointment Date/Time:*Please expect follow-up within 7-10 business days to schedule your home visit. If you have not received follow-up be sure to contact the site directly.*  Medical Equipment/Items Ordered: rolling walker                                                 Agency/Supplier: [employer] [phone]    Special Instructions: No driving, smoking or alcohol   My questions have been answered and I understand these instructions. I will adhere to these goals and the provided educational materials after my discharge from the hospital.  Patient/Caregiver Signature _______________________________ Date __________  Clinician Signature _______________________________________ Date __________  Please bring this form and your medication list with you to all your follow-up doctor's appointments.

## 2020-05-14 NOTE — Consult Note (Signed)
   [facility] Inpatient Consult   05/14/2020  [name] [birth year] [id number]   Referral received for possible [facility] CM services. Patient to discharge home. Referral placed for community [facility] CM RN follow up to assess for community needs. Per review, PCP is Jessica Copland.   Of note, [facility] Care Management services does not replace or interfere with any services that are arranged by inpatient case management or social work.  [name], MSN, [facility] Liaison Nurse Mobile Phone [phone]  Toll free office [phone]

## 2020-05-14 NOTE — Progress Notes (Signed)
Patient discharged home by private vehicle with daughter. All belongings and equipment sent home. Escorted out by LPN and NT

## 2020-05-14 NOTE — Progress Notes (Signed)
Discharge medications discussed with patient and husband in detail and advised them NOT to resume Verapamil or Celexa. They would like their PCP to order RX- discussed that 30 day Rx called in. They need to follow current Rx list and to follow up with PCP in a couple of weeks for any further changes.

## 2020-05-14 NOTE — Plan of Care (Signed)
  Problem: Consults Goal: [facility] BRAIN INJURY PATIENT EDUCATION Description: Description: See Patient Education module for eduction specifics 05/14/2020 1210 by Rodolph Bong, LPN Outcome: Completed/Met 05/14/2020 0786 by Rodolph Bong, LPN Outcome: Progressing   Problem: RH BOWEL ELIMINATION Goal: RH STG MANAGE BOWEL WITH ASSISTANCE Description: STG Manage Bowel with mod I Assistance. 05/14/2020 1210 by Rodolph Bong, LPN Outcome: Completed/Met 05/14/2020 7544 by Rodolph Bong, LPN Outcome: Progressing Goal: RH STG MANAGE BOWEL W/MEDICATION W/ASSISTANCE Description: STG Manage Bowel with Medication with mod I Assistance. 05/14/2020 1210 by Rodolph Bong, LPN Outcome: Completed/Met 05/14/2020 9201 by Rodolph Bong, LPN Outcome: Progressing   Problem: RH SKIN INTEGRITY Goal: RH STG MAINTAIN SKIN INTEGRITY WITH ASSISTANCE Description: STG Maintain Skin Integrity With mod I Assistance. 05/14/2020 1210 by Rodolph Bong, LPN Outcome: Completed/Met 05/14/2020 0071 by Rodolph Bong, LPN Outcome: Progressing   Problem: RH COGNITION-NURSING Goal: RH STG ANTICIPATES NEEDS/CALLS FOR ASSIST W/ASSIST/CUES Description: STG Anticipates Needs/Calls for Assist With mod I Assistance/Cues. 05/14/2020 1210 by Rodolph Bong, LPN Outcome: Completed/Met 05/14/2020 2197 by Rodolph Bong, LPN Outcome: Progressing   Problem: RH PAIN MANAGEMENT Goal: RH STG PAIN MANAGED AT OR BELOW PT'S PAIN GOAL Description: Less than 5 out of 10 05/14/2020 1210 by Rodolph Bong, LPN Outcome: Completed/Met 05/14/2020 0952 by Rodolph Bong, LPN Outcome: Progressing   Problem: RH KNOWLEDGE DEFICIT BRAIN INJURY Goal: RH STG INCREASE KNOWLEDGE OF SELF CARE AFTER BRAIN INJURY Description: Increase knowledge of self care and medication regimen through education from staff with supevision assist.  05/14/2020 1210 by Rodolph Bong, LPN Outcome: Completed/Met 05/14/2020 0952 by Rodolph Bong, LPN Outcome: Progressing

## 2020-05-14 NOTE — Progress Notes (Signed)
[name] PHYSICAL MEDICINE & REHABILITATION PROGRESS NOTE  Subjective/Complaints: No pain anymore in neck or leg. No more constipation after the enema. Feels ready to go home today CT shows resolution of hemorrhage  ROS:   Pt denies SOB, abd pain, CP, N/V/C/D, and vision changes  Objective: Vital Signs: Blood pressure 107/73, pulse 75, temperature 98.5 F (36.9 C), resp. rate 16, height 5\' 2"  (1.575 m), weight 50.8 kg, SpO2 99 %. CT HEAD WO CONTRAST  Result Date: 05/12/2020 CLINICAL DATA:  Subarachnoid hemorrhage.  Confusion and headaches. EXAM: CT HEAD WITHOUT CONTRAST TECHNIQUE: Contiguous axial images were obtained from the base of the skull through the vertex without intravenous contrast. COMPARISON:  04/29/2020 and 04/30/2020. FINDINGS: Brain: Age related atrophy. Chronic small-vessel ischemic changes of the cerebral hemispheric white matter. Near complete resolution of any visible hyperdense subarachnoid blood. Very small amount in the frontal regions. No sign of intraparenchymal hemorrhage or subdural hemorrhage. Left tentorial calcification as previously seen. No hydrocephalus. Vascular: There is atherosclerotic calcification of the major vessels at the base of the brain. Skull: Negative Sinuses/Orbits: Clear/normal Other: None IMPRESSION: Resolution of visible hyperdense subarachnoid blood. No new bleeding. No subdural hematoma. No hydrocephalus or acute stroke. Atrophy and chronic small-vessel ischemic changes of the white matter. Electronically Signed   By: Nelson Chimes M.D.   On: 05/12/2020 12:26   EEG adult  Result Date: 05/12/2020 Lora Havens, MD     05/12/2020  5:08 PM Patient Name: [name] MRN: [id number] Epilepsy Attending: Lora Havens Referring Physician/Provider: Dr Courtney Heys Date: 05/12/2020 Duration: 23.20 mins Patient history: 86yo F with worsening mental status and 2 episodes of shaking/tremors all over. EEG to evaluate for seizure. Level of alertness:  Awake AEDs during EEG study: None Technical aspects: This EEG study was done with scalp electrodes positioned according to the 10-20 International system of electrode placement. Electrical activity was acquired at a sampling rate of 500Hz  and reviewed with a high frequency filter of 70Hz  and a low frequency filter of 1Hz . EEG data were recorded continuously and digitally stored. Description: No posterior dominant rhythm was seen. EEG showed continuous generalized 3 to 6 Hz theta-delta slowing as well as triphasic waves, generalized , maximal bifrontal.  Hyperventilation and photic stimulation were not performed.   ABNORMALITY -Continuous slow, generalized -Triphasic waves, generalized IMPRESSION: This study is suggestive of moderate diffuse encephalopathy, nonspecific etiology but could be related to toxic-metabolic etiology. No seizures or definite epileptiform discharges were seen throughout the recording. Priyanka O Yadav   No results for input(s): WBC, HGB, HCT, PLT in the last 72 hours. No results for input(s): NA, K, CL, CO2, GLUCOSE, BUN, CREATININE, CALCIUM in the last 72 hours.  Physical Exam: BP 107/73 (BP Location: Right Arm)   Pulse 75   Temp 98.5 F (36.9 C)   Resp 16   Ht 5\' 2"  (1.575 m)   Wt 50.8 kg   SpO2 99%   BMI 20.48 kg/m  General: Alert and oriented x 3, No apparent distress HEENT: Head is normocephalic, atraumatic, PERRLA, EOMI, sclera anicteric, oral mucosa pink and moist, dentition intact, ext ear canals clear,  Neck: Supple without JVD or lymphadenopathy Heart: Reg rate and rhythm. No murmurs rubs or gallops Chest: CTA bilaterally without wheezes, rales, or rhonchi; no distress Abdomen: Soft, non-tender, non-distended, bowel sounds positive. Extremities: No clubbing, cyanosis, or edema. Pulses are 2+ Skin: See above.  Intact.  Warm and dry. Psych: memory MUCH better this AM and reasoning appears  much more intact Musc: No edema in extremities.  No tenderness in  extremities. Neuro: Alert, appropriate- as above Motor: Grossly 5/5 throughout, unchanged. Moving all 4 extremities equally    Assessment/Plan: 1. Functional deficits secondary to TBI which require 3+ hours per day of interdisciplinary therapy in a comprehensive inpatient rehab setting.  Physiatrist is providing close team supervision and 24 hour management of active medical problems listed below.  Physiatrist and rehab team continue to assess barriers to discharge/monitor patient progress toward functional and medical goals  Care Tool:  Bathing    Body parts bathed by patient: Face, Right arm, Right lower leg, Chest, Left arm, Left lower leg, Abdomen, Front perineal area, Buttocks, Right upper leg, Left upper leg         Bathing assist Assist Level: Set up assist     Upper Body Dressing/Undressing Upper body dressing Upper body dressing/undressing activity did not occur (including orthotics): N/A What is the patient wearing?: Pull over shirt    Upper body assist Assist Level: Set up assist    Lower Body Dressing/Undressing Lower body dressing    Lower body dressing activity did not occur: N/A What is the patient wearing?: Pants, Underwear/pull up     Lower body assist Assist for lower body dressing: Supervision/Verbal cueing     Toileting Toileting    Toileting assist Assist for toileting: Supervision/Verbal cueing     Transfers Chair/bed transfer  Transfers assist  Chair/bed transfer activity did not occur: N/A  Chair/bed transfer assist level: Supervision/Verbal cueing     Locomotion Ambulation   Ambulation assist      Assist level: Contact Guard/Touching assist Assistive device: No Device Max distance: 160ft   Walk 10 feet activity   Assist     Assist level: Contact Guard/Touching assist Assistive device: No Device   Walk 50 feet activity   Assist    Assist level: Contact Guard/Touching assist Assistive device: No Device     Walk 150 feet activity   Assist    Assist level: Contact Guard/Touching assist Assistive device: No Device    Walk 10 feet on uneven surface  activity   Assist     Assist level: Contact Guard/Touching assist Assistive device: Other (comment) (railing)   Wheelchair     Assist Will patient use wheelchair at discharge?: No (patient is a functional ambulator)   Wheelchair activity did not occur: N/A         Wheelchair 50 feet with 2 turns activity    Assist    Wheelchair 50 feet with 2 turns activity did not occur: N/A       Wheelchair 150 feet activity     Assist Wheelchair 150 feet activity did not occur: N/A          Medical Problem List and Plan: 1.  Decreased functional mobility secondary to traumatic bilateral frontal SAH with small amount of right occipital subarachnoid hemorrhage as well as nondisplaced superolateral orbital fracture  Team conference 7/27  DC home today 2.  Antithrombotics: -DVT/anticoagulation: Subcutaneous heparin- d/c as hemoglobin is dropping and walking>250 feet             -antiplatelet therapy: N/A 3. Pain Management: Robaxin 500 mg every 6 hours and oxycodone as needed  7/29: Left hip pain after abducting yesterday. Present in buttock, feels like a severe ache. Ordered muscle rub. Monitor for allergic reaction.   7/30: Pain in hip is improving. Reviewed XR with patient- no abnormalities. Heating pad to neck and one  time Robaxin now   7/31- will d/c robaxin which can cause sedation/cognitive issues and start Skelaxin 800 mg TID- con't oxycodone for now as order, but asked family to try to stick to tylenol.  8/1- MUCH better with "transferring her way" as well as scheduled skelaxin- tightness much improved and pain 0/10 per pt this AM and since yesterday evening.    8/2: pain is much better controlled today! 4. Mood: Patient on Celexa prior to admission.             -antipsychotic agents: N/A. Very positive 5.  Neuropsych: This patient is capable of making decisions on her own behalf. 6. Skin/Wound Care: Routine skin checks 7. Fluids/Electrolytes/Nutrition: Routine in and outs. 8.  Seizure prophylaxis.    Keppra 500 mg twice daily x 7 days.  EEG negative 9.  Hypertension.  Norvasc 2.5 mg daily.       8/1- much improved- 120s-130s SBP- con't regimen 10.  Question syncope.  Carotid Dopplers negative.  Echocardiogram unremarkable.  Monitor with increased mobility 11.  Hypothyroidism.  TSH 4.079.    Continue Synthroid  7/31- on home dose of meds 50 mcg daily- verified with pt and family 27.  Hyperlipidemia.  Continue Crestor. 13. CKD stage III.  Latest creatinine 1.03.               Creatinine 0.92 on 7/24, up to 1.15 on 7/26, improved on 7/27.  Encourage hydration 14.  Acute blood loss anemia             Hemoglobin 11.1 on 7/24, down to 9.5 on 7/26, improved on 7/27 15. Constipation  7/26- only 1 small BM in last 7 days- will order sorbitol and prn enema if needed  7/30: Add prune juice with meals 16. Vertebrobasilar insufficiency on clinical vertigo testing: will schedule outpatient neurology follow-up. Will start Meclizine.  17. Cognitive impairment and functional decline  7/31- think could be from Robaxin around the clock and oxycodone- daughters said pt sensitive to meds- head CT looks much better- spoke with Neuro - they suggested an EEG, esp since daughters mentioned 2 episodes of shaking/tremors "all over" last 1 this AM. Last EEG (-)-   8/1- EEG (-) for seizures- has some moderate encephalopathy which was done yesterday- could be pain meds- head CT looked great- pt's pain has resolved per pt this AM and much more with it/awake/alert- will con't regimen for now- if family asks, can call Neurology consult, but when we spoke yesterday they said only calll if not improved.   8/2: cognition is much improved today. CT shows resolution of hemorrhage- discussed with patient.    >30 minutes spent in  discharge of patient including review of medications and follow-up appointments, physical examination, reviewing XR results with patient, reviewing notes, discussion of pain and plan for return to home, and in answering all patient's questions     LOS: 9 days A FACE TO FACE EVALUATION WAS Violet 05/14/2020, 8:33 AM

## 2020-05-15 ENCOUNTER — Other Ambulatory Visit: Payer: Self-pay | Admitting: *Deleted

## 2020-05-15 ENCOUNTER — Telehealth: Payer: Self-pay | Admitting: Family Medicine

## 2020-05-15 ENCOUNTER — Encounter: Payer: Self-pay | Admitting: *Deleted

## 2020-05-15 ENCOUNTER — Ambulatory Visit: Payer: Medicare Other | Admitting: Family Medicine

## 2020-05-15 ENCOUNTER — Telehealth: Payer: Self-pay

## 2020-05-15 DIAGNOSIS — F411 Generalized anxiety disorder: Secondary | ICD-10-CM

## 2020-05-15 MED ORDER — MIRTAZAPINE 15 MG PO TABS: 15.0000 mg | ORAL_TABLET | Freq: Every day | ORAL | 5 refills | Status: DC

## 2020-05-15 NOTE — Patient Outreach (Signed)
[facility]) Care Management [facility] CM Telephone Outreach, Transition of Care day # 1, new referral Post- SNF discharge day # 1   05/15/2020  [name] 03/15/1931 [id number]  Successful telephone outreach to [name], 86 y/o female referred to [place] CM 05/14/20 by [facility] liaison after patient experienced hospitalization July 18- 24, 2021 for syncope with collapse; patient was diagnosed with SAH/ TBI.  She was discharged to [facility]/ inpatient rehabilitation unit and subsequently discharged home to self care on May 14, 2020 with home health services in place through [facility] for PT/ OT/ and ST.  Patient has history including, but not limited to, HTN/ HLD; CKD- III; glaucome; and breast CA.  Phone call completed with both patient and her spouse while phone on speaker mode; patient provides verbal consent to speak with her spouse [name] at any time; noted spouse is listed on [facility] as well.  HIPAA/ identity verified and patient/ spouse reports "doing really good" post- recent SNF rehabilitation discharge.  She denies pain and new/ recent falls and sounds to be in no distress throughout phone call today.  Reports has lost weight with recent hospitalization and "is moving slow, using walker for now" but denies clinical concerns.  -- 2 local adult daughters assisting with transportaton and managing post-discharge appointments; family supportive and involved -- post-hospital discharge PCP appointment pending; confirms she has spoken with PCP post- discharge, but is relying on daughter to make post- discharge appointment around her work schedule, as daughter to transport -- no medication concerns: patient continues to manage medications independently using pill box; reports has already filled pill box and confirms that she has all medications and went by hospital/ SNF discharge medication list when filling pill box; declines medication review today, but is agreeable  at time of next outreach -- reports hopeful to get back to her "normal life, normal weight, normal activities;" reports she used to walk 4-5 miles per day weather permitting -- SDOH updated- no concerns identified by patient or by assessment of same  Patient denies further issues, concerns, or problems today.  I provided/ confirmed that patient has my direct phone number, the main [facility] CM office phone number, and the [facility] CM 24-hour nurse advice phone number should issues arise prior to next scheduled [facility] CM outreach.  Encouraged patient to contact me directly if needs, questions, issues, or concerns arise prior to next scheduled outreach; patient agreed to do so.  Plan:  Patient will take medications as prescribed and will attend all scheduled provider appointments  Patient will promptly schedule post-hospital discharge office visit with PCP  Patient will promptly notify care providers for any new concerns/ issues/ problems that arise  Patient will actively participate in home health services as ordered post-hospital discharge  I will make patien't PCP aware of [place] RN CM involvement in patient's care-- will send barriers letter  [facility] CM outreach to continue with scheduled phone call next week for hopeful completion of medication review and [facility] CM initial assessment  I appreciate the opportunity to participate in [name]'s care,  [name], RN, BSN, [employer] Coordinator [facility] Care Management  [phone]

## 2020-05-15 NOTE — Telephone Encounter (Signed)
Called and spoke with [name] is back home She is doing ok, but is somewhat confused still  She is getting anxious and worried about people following her again- this was worse in the hospital, better since she is home   She is vaccinated against covid but her daughter worries about could she have covid - we discussed getting her tested at drugstore We agreed to have her try remeron at 7.5 to 15 mg in hopes of easing her anxiety and also helping to stimulate her appetite - they will let me know how this works for her   She is not using skelaxin,  I asked them to also avoid antivert

## 2020-05-15 NOTE — Telephone Encounter (Signed)
Transition Care Management Follow-up Telephone Call  Date of discharge and from where: [facility] on 05/14/20  How have you been since you were released from the hospital? Pt states she is doing well, just "unable to move around like I want to." Declines current pain and states the only time she has stiffness is when she is laying down at night. Pt feels she cannot get comfortable or turn over in the bed. Pt states her head feels fine and the area where the stitches were placed is better. Declines weakness, SOB, fever or n/v/d.  Any questions or concerns? No   Items Reviewed:  Did the pt receive and understand the discharge instructions provided? Yes   Medications obtained and verified? No, declined reviewing over the phone.  Any new allergies since your discharge? No   Dietary orders reviewed? Yes  Do you have support at home? Yes   Other (ie: DME, Home Health, etc): N/A  Functional Questionnaire: (I = Independent and D = Dependent)  Bathing/Dressing- I   Meal Prep- I  Eating- I  Maintaining continence- I  Transferring/Ambulation- D, using a walker currently.  Managing Meds- I   Follow up appointments reviewed:    PCP Hospital f/u appt confirmed? No  pt states provider is aware of her hospital stay and her daughter is in contact with PCP and she does not want to schedule apt over the phone.  [facility] f/u appt confirmed? Yes    Are transportation arrangements needed? No   If their condition worsens, is the pt aware to call  their PCP or go to the ED? Yes  Was the patient provided with contact information for the PCP's office or ED? Yes  Was the pt encouraged to call back with questions or concerns? Yes

## 2020-05-15 NOTE — Telephone Encounter (Signed)
Caller:Brenda (pt's daughter) Call Back # [phone]  Patient's daughter states patient is weak unable to eat  and a bit congested/ unable to taste after hospital stay. Patient hospitalized after a fall.   Pt daughter wonders if patient should be tested for covid and would like Dr.Copland to write a script to increase patient appetite.   Please Advise

## 2020-05-16 ENCOUNTER — Telehealth: Payer: Self-pay

## 2020-05-16 ENCOUNTER — Other Ambulatory Visit: Payer: Self-pay | Admitting: *Deleted

## 2020-05-16 ENCOUNTER — Ambulatory Visit: Payer: Medicare Other | Admitting: Family Medicine

## 2020-05-16 NOTE — Patient Outreach (Signed)
[facility]) Care Management [place]- documentation only  05/16/2020  [name] 1930-12-07 [id number]  [facility] CM Care Coordination documentation re:  [name], 86 y/o female referred to [facility] RN CM 05/14/20 by [facility] liaison after patient experienced hospitalization July 18- 24, 2021 for syncope with collapse; patient was diagnosed with SAH/ TBI.  She was discharged to [facility]/ inpatient rehabilitation unit and subsequently discharged home to self care on May 14, 2020 with home health services in place through [facility] for PT/ OT/ and ST.  Patient has history including, but not limited to, HTN/ HLD; CKD- III; glaucome; and breast CA.  Noted through review of EHR that patient's PCP office is completing transition of care follow up post- SNF/ inpatient discharge  Plan:  Will close [facility] CM transition of care program and will continue to follow patient as scheduled for care management/ coordination/ disease management needs  [name], RN, BSN, [place] Care Management  [phone]

## 2020-05-16 NOTE — Telephone Encounter (Signed)
8/3-SW received call from Farmer from [facility] stating she called the pt to schedule outpatient therapies and she declined.SW to follow-up with family.  8/4- SW called pt dtr [name] and conference called her sister [name] to discuss outpatient therapy. Pt states seh was informed she would have homehealth therapies. SW informed will have to discuss with attending and will follow-up. Preferred [facility] is [place].

## 2020-05-17 DIAGNOSIS — R430 Anosmia: Secondary | ICD-10-CM | POA: Diagnosis not present

## 2020-05-17 DIAGNOSIS — Z20822 Contact with and (suspected) exposure to covid-19: Secondary | ICD-10-CM | POA: Diagnosis not present

## 2020-05-17 DIAGNOSIS — Z20828 Contact with and (suspected) exposure to other viral communicable diseases: Secondary | ICD-10-CM | POA: Diagnosis not present

## 2020-05-18 ENCOUNTER — Encounter
Payer: Medicare Other | Attending: Physical Medicine and Rehabilitation | Admitting: Physical Medicine and Rehabilitation

## 2020-05-19 DIAGNOSIS — N3001 Acute cystitis with hematuria: Secondary | ICD-10-CM | POA: Diagnosis not present

## 2020-05-21 ENCOUNTER — Encounter: Payer: Self-pay | Admitting: *Deleted

## 2020-05-21 ENCOUNTER — Other Ambulatory Visit: Payer: Self-pay | Admitting: *Deleted

## 2020-05-21 NOTE — Patient Outreach (Signed)
[facility]) Care Management [place] Telephone Outreach PCP office completed Transition of Care follow up post- SNF discharge Post- SNF discharge day # 7 Unsuccessful (consecutive) outreach attempt # 1- previously engaged patient  05/21/2020  [name] 1931/04/29 [id number]  Unsuccessful telephone outreach attempt x 2 to [name], 86 y/o female referred to [facility] RN CM 05/14/20 by [facility] liaison after patient experienced hospitalization July 18- 24, 2021 for syncope with collapse; patient was diagnosed with SAH/ TBI.  She was discharged to [facility]/ inpatient rehabilitation unit and subsequently discharged home to self care on May 14, 2020 with home health services in place through [facility] for PT/ OT/ and ST.  Patient has history including, but not limited to, HTN/ HLD; CKD- III; glaucome; and breast CA.  HIPAA compliant voice mail message left for patient on cell number, requesting return call back.  Immediately attempted second outreach to home number, patient appeared to answer phone, but when I attempted to clarify, no one was on line; unable to leave voice mail message on home number.  Plan:  Will re-attempt [facility] CM telephone outreach within 4 business days if I do not hear back from patient first  [name], RN, BSN, [employer] Coordinator [facility] Care Management  [phone]

## 2020-05-22 ENCOUNTER — Telehealth: Payer: Self-pay

## 2020-05-22 NOTE — Progress Notes (Addendum)
[place] [facility] [address], [place], [place] [address] [phone]  Date:  05/24/2020   Name:  [name]   DOB:  1931-08-03   MRN:  [id number]  PCP:  Darreld Mclean, MD    Chief Complaint: Hospitalization Follow-up (follow up)   History of Present Illness:  [name] is a 86 y.o. very pleasant female patient who presents with the following:  Following up today from recent hospital admission Last seen by myself in February of this year -virtual visit She tested positive for COVID-19 January of this year but got back to baseline  She was admitted to the hospital from 7/18 with fall, associated orbital fracture, subarachnoid and intraparenchymal hemorrhage.  She was found down at church with this injury, did not remember falling. Follow-up head CTs did not change, she was not taken to the OR She was started on Keppra for seizure prophylaxis. She was discharged to CIR following hospital stay as follows Admit date: 05/05/2020 Discharge date: 05/14/2020  Discharge Diagnoses:  Principal Problem:   TBI (traumatic brain injury) [facility]) DVT prophylaxis Pain management Hypertension Hypothyroidism Hyperlipidemia Seizure prophylaxis CKD stage III Constipation History of Covid January 2021 History of left breast cancer Mood stabilization  Brief HPI:   [name] is a 86 y.o. right-handed female with history of left breast cancer status post chemotherapy and radiation 2010, Covid January 2021, hypertension, hypothyroidism, Barrett's esophagus, hyperlipidemia.  Patient lives with husband independent prior to admission.  1 level home 5 steps to entry.  Presented 04/29/2020 after a fall questionable syncopal event while at church with transient loss of consciousness.  Cranial CT scan showed acute intracranial hemorrhage with bilateral frontal subarachnoid hemorrhage and a small amount of right occipital subarachnoid  hemorrhage.  Possible focus of intraparenchymal hemorrhage in the left frontal and subcortical gray-white matter.  Left frontotemporal periorbital preseptal scalp hematoma as well as nondisplaced superolateral orbital fracture.  CT cervical spine negative.  Admission chemistries unremarkable except glucose 116 creatinine 1.06, hemoglobin 12.2, SARS coronavirus negative.  Urinalysis negative nitrite.  Neurosurgery Dr. Sherley Bounds conservative care with follow-up cranial CT scan stable.  Maintained on Keppra for seizure prophylaxis and tapered.  No surgical intervention needed for left lateral orbital fracture per Dr. Melida Quitter.  EEG negative for seizure.  Echocardiogram with ejection fraction of 65% no wall motion abnormalities.  Carotid Dopplers no ICA stenosis.  Follow-up chemistries improved creatinine 0.91 hemoglobin 10.9.  Patient was cleared to begin subcutaneous heparin for DVT prophylaxis.  Tolerating a regular diet.  Patient was admitted for a comprehensive rehab program  Hospital Course: [name] was admitted to rehab 05/05/2020 for inpatient therapies to consist of PT, ST and OT at least three hours five days a week. Past admission physiatrist, therapy team and rehab RN have worked together to provide customized collaborative inpatient rehab.  Pertaining to patient's traumatic bilateral frontal SAH small amount of right occipital subarachnoid hemorrhage nondisplaced superolateral orbital fracture remained stable conservative care with follow-up per neurosurgery Dr. Sherley Bounds in ENT Dr. Melida Quitter.  Ophthalmology follow-up 05/05/2020 noted exam to be unremarkable.  No retinal breaks or tears seen.  Patient had been on subcutaneous heparin for DVT prophylaxis discontinued his mobility improved close monitoring for any bleeding episodes. .  Pain managed with use of Robaxin oxycodone as needed.  Blood pressure remains soft on low-dose Norvasc and she would follow-up with her primary MD.  There was  some question of possible syncope related to her fall.  Carotid Dopplers negative echocardiogram unremarkable no orthostasis noted.  Therapy did follow-up with vestibular testing positive vertebrobasilar insufficiency recommendations were made for outpatient referral with neurology services.  Hypothyroidism TSH 4.079 she remained on hormone supplement.  She did have some mild hyponatremia 130 and monitored.  Hyperlipidemia Crestor as directed.  CKD stage III although latest creatinine 0.96.  Acute blood loss anemia with latest hemoglobin 10.9 her subcutaneous heparin had been discontinued.  No overt signs of bleeding noted. Blood pressures were monitored on TID basis and controlled  Rehab course: During patient's stay in rehab weekly team conferences were held to monitor patient's progress, set goals and discuss barriers to discharge. At admission, patient required minimal assist 150 feet rolling walker minimal assist stand pivot transfers minimal assist supine to sit.  Set up upper body bathing minimal assist lower body bathing set up upper body dressing minimal assist lower body dressing  She  has had improvement in activity tolerance, balance, postural control as well as ability to compensate for deficits. [name] has had improvement in functional use RUE/LUE  and RLE/LLE as well as improvement in awareness.  Working with energy conservation techniques ambulates 255 feet hand-held assist.  Provided verbal cues for erect posture as tolerated.  Perform sit to supine minimal assist.  Sit to stand contact-guard assist.  She transferred to the walk-in shower contact-guard assist completed all bathing with set up transferred out of shower to recliner with supervision.  Put her shirt on supervision pants with close supervision.  Speech therapy follow-up patient recalled familiar and current medications with supervision.  She demonstrated ability to organize daily pills.  She did require some assistance for mental  flexibility and rationale behind participation with therapy activities.  It was discussed with family need for assistance and supervision for safety at home.  She scored a 35/56 on Berg balance scale.Discharge was extended a few days to establish her mobilty needs and complete family teaching as well arrangements being made for assistance on discharge.  Family teaching was completed with anticipation of discharge to home. Disposition: Discharged to home  Pt is seen here today with her daughter They note "good days and bad days," she sometimes has a lot of back/ head pain and confusion Her neck is still sore and stiff, as is her back  She notes a particular tender spinous process in her lower thoracic spine Her husband [name] is doing ok- they have their own home. 2 daughters live fairly close to her Her husband does drive still She is able to shower with a chair, and can dress herself ok She has a walker, but no WC  Her appetite is getting better She is using Remeron for anxiety and appetite stimulation-she notes this may cause her to feel nauseated, but she is trying to take it  She is getting PT, speech and OT at home She is supposed to see cards, ENT and neurosurgeon, and also optho for follow-up.  They are working on arranging these appointments  Wt Readings from Last 3 Encounters:  05/24/20 106 lb (48.1 kg)  05/12/20 111 lb 15.9 oz (50.8 kg)  04/29/20 112 lb 14 oz (51.2 kg)     Patient Active Problem List   Diagnosis Date Noted  . Intracranial hemorrhage ([place])   . Acute blood loss anemia   . TBI (traumatic brain injury) ([place]) 05/04/2020  . Back pain 05/04/2020  . SAH (subarachnoid hemorrhage) ([place])  04/29/2020  . Fall at home, initial encounter 04/29/2020  . Orbital fracture, closed, initial encounter ([place]) 04/29/2020  . Basal cell carcinoma (BCC) in situ of skin 04/04/2020  . Essential hypertension 03/21/2019  . Stage 3a chronic kidney disease   . Systolic murmur 14/78/2956   . Hypercalcemia   . AKI (acute kidney injury) ([place])   . Dizziness 02/22/2019  . Squamous cell carcinoma in situ of skin 11/30/2018  . Diarrhea 09/08/2017  . Nausea vomiting and diarrhea 09/08/2017  . Elevated serum creatinine 09/08/2017  . PVC (premature ventricular contraction) 09/08/2017  . Glaucoma 09/08/2017  . Hx of meningioma of the brain 08/28/2017  . Hoarseness of voice 02/28/2016  . Raynaud's phenomenon 02/28/2016  . Esophageal spasm 09/20/2013  . Osteoporosis 03/18/2012  . Fracture, shoulder 03/06/2012  . Hyperlipidemia   . Breast cancer [facility])     Past Medical History:  Diagnosis Date  . Autoimmune disease ([place])   . Barrett's esophagus   . Bone lesion    sacrum  . Breast cancer ([place]) 2010   cT1 N0 M0; ER+/ PR+/ HER2 neg; s/p lumpectomy 07/2010; started adjuvant hormonal therapy 10/2009  . Cervical cancer ([place])   . Dysphagia   . Fx distal radius NEC-closed   . Glaucoma   . Humerus fracture 03/2012   impacted left humueral neck fracture, treated by Dr. Amedeo Plenty  . Hyperlipidemia   . Hypertension   . Osteoporosis 03/18/2012  . Pneumonia   . Thyroid disease     Past Surgical History:  Procedure Laterality Date  . ABDOMINAL HYSTERECTOMY  1972   cancer          1 tube 1 ovary  . APPENDECTOMY    . BREAST LUMPECTOMY Left 2010  . colon polyp removal    . EYE SURGERY    . HAND SURGERY    . ortho procedures     multiple  . OVARIAN CYST REMOVAL  1978  . SHOULDER SURGERY  1954   left    Social History   Tobacco Use  . Smoking status: Former Smoker    Quit date: 12/16/1986    Years since quitting: 33.4  . Smokeless tobacco: Never Used  . Tobacco comment: FORMER SMOKER 30 YEARS AGO  Vaping Use  . Vaping Use: Never used  Substance Use Topics  . Alcohol use: Yes    Alcohol/week: 0.0 standard drinks    Comment: OCCASIONALLY  . Drug use: No    Family History  Problem Relation Age of Onset  . Heart disease Father   . Cancer Sister        lung, kidney  .  Cancer Brother        brain  . Cancer Paternal Aunt        breast  . Breast cancer Paternal Aunt   . Cancer Brother        liver, lung  . Osteoporosis Neg Hx     Allergies  Allergen Reactions  . Daypro [Oxaprozin] Swelling    Face and tongue  . Prolia [Denosumab]     unknown    Medication list has been reviewed and updated.  Current Outpatient Medications on File Prior to Visit  Medication Sig Dispense Refill  . acetaminophen (TYLENOL) 325 MG tablet Take 2 tablets (650 mg total) by mouth every 6 (six) hours as needed for mild pain.    Marland Kitchen amLODipine (NORVASC) 2.5 MG tablet Take 1 tablet (2.5 mg total) by mouth daily. 30 tablet 0  .  Calcium Carbonate-Vitamin D (CALCIUM + D PO) Take 1 capsule by mouth daily.     Marland Kitchen latanoprost (XALATAN) 0.005 % ophthalmic solution Place 1 drop into both eyes at bedtime.     Marland Kitchen levothyroxine (SYNTHROID) 50 MCG tablet TAKE 1 TABLET BY MOUTH DAILY BEFORE BREAKFAST 90 tablet 3  . mirtazapine (REMERON) 15 MG tablet Take 1 tablet (15 mg total) by mouth at bedtime. (Patient taking differently: Take 15 mg by mouth daily. Patient takes in morning) 30 tablet 5  . Multiple Vitamin (MULTIVITAMIN WITH MINERALS) TABS tablet Take 1 tablet by mouth daily.    . rosuvastatin (CRESTOR) 10 MG tablet Take 1 tablet (10 mg total) by mouth daily. TAKE 1 BY MOUTH DAILY 90 tablet 1   No current facility-administered medications on file prior to visit.    Review of Systems:  As per HPI- otherwise negative.   Physical Examination: Vitals:   05/24/20 1333  BP: 118/72  Pulse: 80  Resp: 15  SpO2: 98%   Vitals:   05/24/20 1333  Weight: 106 lb (48.1 kg)  Height: '5\' 2"'$  (1.575 m)   Body mass index is 19.39 kg/m. Ideal Body Weight: Weight in (lb) to have BMI = 25: 136.4  GEN: no acute distress.  Looks well and nearly back to her baseline.  She has a well-healing incision at the left temple HEENT: Atraumatic, Normocephalic.  Ears and Nose: No external deformity. CV:  RRR, No M/G/R. No JVD. No thrill. No extra heart sounds. PULM: CTA B, no wheezes, crackles, rhonchi. No retractions. No resp. distress. No accessory muscle use. ABD: S, NT, ND. No rebound. No HSM. EXTR: No c/c/e PSYCH: Normally interactive. Conversant.  Using a walker to ambulate She is tender over the mid to lower thoracic spine at the spinous processes  Assessment and Plan: Hospital discharge follow-up  Traumatic brain injury with loss of consciousness, subsequent encounter  Hyponatremia - Plan: Comprehensive metabolic panel  Thoracic spine pain - Plan: DG Thoracic Spine 2 View   Patient here today to follow-up from recent hospitalization Anaeli fell, sustained a traumatic brain injury and bleed Thankfully she is significantly better, though not quite back to her baseline.  Her daughter is here with her and is helping to support I encouraged her to continue taking Remeron which is helping with her anxiety We will check her CMP today to follow-up on hyponatremia, will also obtain thoracic spine films  Asked her to see me in 2 months to check on her progress This visit occurred during the SARS-CoV-2 public health emergency.  Safety protocols were in place, including screening questions prior to the visit, additional usage of staff PPE, and extensive cleaning of exam room while observing appropriate contact time as indicated for disinfecting solutions.     Signed Lamar Blinks, MD  DG Thoracic Spine 2 View  Result Date: 05/24/2020 CLINICAL DATA:  Lower thoracic pain after syncopal episode EXAM: THORACIC SPINE 2 VIEWS COMPARISON:  Lumbar spine radiograph 05/04/2020 FINDINGS: There is chronic compression deformity of L1. New compression deformity of T12 with nearly 50% loss of height at the superior endplate. There is focal kyphosis at the thoracolumbar junction as result. Multilevel degenerative changes are present with disc space narrowing and endplate irregularity. IMPRESSION: New,  probably acute compression deformity of T12 with nearly 50% loss of height. Electronically Signed   By: Macy Mis M.D.   On: 05/24/2020 15:20   Received her x-ray report, message to patient  Received her labs 8/13- message to  pt Results for orders placed or performed in visit on 05/24/20  Comprehensive metabolic panel  Result Value Ref Range   Sodium 137 135 - 145 mEq/L   Potassium 4.8 3.5 - 5.1 mEq/L   Chloride 98 96 - 112 mEq/L   CO2 28 19 - 32 mEq/L   Glucose, Bld 102 (H) 70 - 99 mg/dL   BUN 17 6 - 23 mg/dL   Creatinine, Ser 1.03 0.40 - 1.20 mg/dL   Total Bilirubin 0.3 0.2 - 1.2 mg/dL   Alkaline Phosphatase 230 (H) 39 - 117 U/L   AST 31 0 - 37 U/L   ALT 35 0 - 35 U/L   Total Protein 6.7 6.0 - 8.3 g/dL   Albumin 3.5 3.5 - 5.2 g/dL   GFR 50.67 (L) >60.00 mL/min   Calcium 10.2 8.4 - 10.5 mg/dL

## 2020-05-22 NOTE — Telephone Encounter (Signed)
SW returned phone call to pt dtr [name] to inform updates from attending would be ok with pt having home health services, and pt will be seen in their rehab clinic on 8/12. She requested that she be the primary contact when the appointment is scheduled. SW informed will follow-up once there is more information on HHA.   SW spoke with [facility] [phone]) to discuss HHPT/OT/SLP referral; referral was accepted for [facility] [phone]. SW informed to contact pt dtr [name] to schedule appointments.   *SW left message for pt dtr [name] on above, and provided contact information for [place]; SW also indicated to allot 2-3 business days for someone to follow-up to schedule the home visit. SW encouraged follow-up if needed.

## 2020-05-23 DIAGNOSIS — M810 Age-related osteoporosis without current pathological fracture: Secondary | ICD-10-CM | POA: Diagnosis not present

## 2020-05-23 DIAGNOSIS — G4700 Insomnia, unspecified: Secondary | ICD-10-CM | POA: Diagnosis not present

## 2020-05-23 DIAGNOSIS — R41841 Cognitive communication deficit: Secondary | ICD-10-CM | POA: Diagnosis not present

## 2020-05-23 DIAGNOSIS — S0211HD Other fracture of occiput, left side, subsequent encounter for fracture with routine healing: Secondary | ICD-10-CM | POA: Diagnosis not present

## 2020-05-23 DIAGNOSIS — S0003XD Contusion of scalp, subsequent encounter: Secondary | ICD-10-CM | POA: Diagnosis not present

## 2020-05-23 DIAGNOSIS — E039 Hypothyroidism, unspecified: Secondary | ICD-10-CM | POA: Diagnosis not present

## 2020-05-23 DIAGNOSIS — I081 Rheumatic disorders of both mitral and tricuspid valves: Secondary | ICD-10-CM | POA: Diagnosis not present

## 2020-05-23 DIAGNOSIS — M1990 Unspecified osteoarthritis, unspecified site: Secondary | ICD-10-CM | POA: Diagnosis not present

## 2020-05-23 DIAGNOSIS — S066X9D Traumatic subarachnoid hemorrhage with loss of consciousness of unspecified duration, subsequent encounter: Secondary | ICD-10-CM | POA: Diagnosis not present

## 2020-05-23 DIAGNOSIS — D62 Acute posthemorrhagic anemia: Secondary | ICD-10-CM | POA: Diagnosis not present

## 2020-05-23 DIAGNOSIS — K5900 Constipation, unspecified: Secondary | ICD-10-CM | POA: Diagnosis not present

## 2020-05-23 DIAGNOSIS — E785 Hyperlipidemia, unspecified: Secondary | ICD-10-CM | POA: Diagnosis not present

## 2020-05-23 DIAGNOSIS — I129 Hypertensive chronic kidney disease with stage 1 through stage 4 chronic kidney disease, or unspecified chronic kidney disease: Secondary | ICD-10-CM | POA: Diagnosis not present

## 2020-05-23 DIAGNOSIS — K2270 Barrett's esophagus without dysplasia: Secondary | ICD-10-CM | POA: Diagnosis not present

## 2020-05-23 DIAGNOSIS — I7300 Raynaud's syndrome without gangrene: Secondary | ICD-10-CM | POA: Diagnosis not present

## 2020-05-23 DIAGNOSIS — N1831 Chronic kidney disease, stage 3a: Secondary | ICD-10-CM | POA: Diagnosis not present

## 2020-05-24 ENCOUNTER — Ambulatory Visit: Payer: Medicare Other | Admitting: Family Medicine

## 2020-05-24 ENCOUNTER — Encounter: Payer: Self-pay | Admitting: Family Medicine

## 2020-05-24 ENCOUNTER — Ambulatory Visit (HOSPITAL_BASED_OUTPATIENT_CLINIC_OR_DEPARTMENT_OTHER)
Admission: RE | Admit: 2020-05-24 | Discharge: 2020-05-24 | Disposition: A | Discharge status: Home / Self Care | Payer: Medicare Other | Source: Ambulatory Visit | Attending: Family Medicine | Admitting: Family Medicine

## 2020-05-24 ENCOUNTER — Other Ambulatory Visit: Payer: Self-pay

## 2020-05-24 VITALS — BP 118/72 | HR 80 | Resp 15 | Ht 62.0 in | Wt 106.0 lb

## 2020-05-24 DIAGNOSIS — M546 Pain in thoracic spine: Secondary | ICD-10-CM

## 2020-05-24 DIAGNOSIS — E871 Hypo-osmolality and hyponatremia: Secondary | ICD-10-CM

## 2020-05-24 DIAGNOSIS — S069X9D Unspecified intracranial injury with loss of consciousness of unspecified duration, subsequent encounter: Secondary | ICD-10-CM | POA: Diagnosis not present

## 2020-05-24 DIAGNOSIS — Z09 Encounter for follow-up examination after completed treatment for conditions other than malignant neoplasm: Secondary | ICD-10-CM

## 2020-05-24 NOTE — Patient Instructions (Signed)
It was good to see you again today- we will check your labs today to evaluate your sodium Please go to x-ray after your labs today Please see me in 2 months to check on how you are doing

## 2020-05-25 ENCOUNTER — Other Ambulatory Visit: Payer: Self-pay | Admitting: *Deleted

## 2020-05-25 ENCOUNTER — Encounter: Payer: Self-pay | Admitting: *Deleted

## 2020-05-25 ENCOUNTER — Telehealth: Payer: Self-pay | Admitting: Family Medicine

## 2020-05-25 LAB — COMPREHENSIVE METABOLIC PANEL
ALT: 35 U/L (ref 0–35)
AST: 31 U/L (ref 0–37)
Albumin: 3.5 g/dL (ref 3.5–5.2)
Alkaline Phosphatase: 230 U/L — ABNORMAL HIGH (ref 39–117)
BUN: 17 mg/dL (ref 6–23)
CO2: 28 mEq/L (ref 19–32)
Calcium: 10.2 mg/dL (ref 8.4–10.5)
Chloride: 98 mEq/L (ref 96–112)
Creatinine, Ser: 1.03 mg/dL (ref 0.40–1.20)
GFR: 50.67 mL/min — ABNORMAL LOW (ref >60.00)
Glucose, Bld: 102 mg/dL — ABNORMAL HIGH (ref 70–99)
Potassium: 4.8 mEq/L (ref 3.5–5.1)
Sodium: 137 mEq/L (ref 135–145)
Total Bilirubin: 0.3 mg/dL (ref 0.2–1.2)
Total Protein: 6.7 g/dL (ref 6.0–8.3)

## 2020-05-25 NOTE — Telephone Encounter (Signed)
[name]  [phone]  Requesting verbal order for speech therapy    Once a week for 2 weeks Twice a week for 4 weeks

## 2020-05-25 NOTE — Patient Outreach (Addendum)
[facility]) Care Management [place] Telephone Outreach PCP office completed Transition of Care follow up post- SNF discharge Post- SNF discharge day # 11 Unsuccessful (consecutive) outreach attempt # 2- previously engaged patient  05/25/2020  [name] 06/03/1931 [id number]  Unsuccessful (consecutive) second telephone outreach attempt to [name], 86 y/o female referred to [place] 05/14/20 by [facility] liaison after patient experienced hospitalization July 18- 24, 2021 for syncope with collapse; patient was diagnosed with SAH/ TBI. She was discharged to [facility]/ inpatient rehabilitation unit and subsequently discharged home to self care on May 14, 2020 with home health services in place through [facility] for PT/ OT/ and ST. Patient has history including, but not limited to, HTN/ HLD; CKD- III; glaucome; and breast CA.  Someone appeared to answer phone, but when I attempted to clarify, no one was on line; unable to leave voice mail message requesting call back  Plan:  Will re-attempt [facility] CM telephone outreach within 4 business days if I do not hear back from patient first  Will place unsuccessful outreach letter in mail to patient as this is the second consecutive unsuccessful outreach, in patient who was recently engaged in [place] program  [name], [place], BSN, [employer] Coordinator [facility] Care Management  [phone]

## 2020-05-28 NOTE — Telephone Encounter (Signed)
Left detailed message with verbal orders.

## 2020-05-29 ENCOUNTER — Encounter: Payer: Self-pay | Admitting: Family Medicine

## 2020-05-29 ENCOUNTER — Telehealth: Payer: Self-pay | Admitting: Family Medicine

## 2020-05-29 NOTE — Telephone Encounter (Signed)
Verbal Orders for  Occupational Therapy  2x a week for 3 weeks  1x a week for 1 week  Manuela Schwartz said she does have confidential voicemail so you can leave detail message on this.

## 2020-05-30 ENCOUNTER — Telehealth: Payer: Self-pay | Admitting: Family Medicine

## 2020-05-30 ENCOUNTER — Encounter: Payer: Self-pay | Admitting: *Deleted

## 2020-05-30 ENCOUNTER — Other Ambulatory Visit: Payer: Self-pay | Admitting: *Deleted

## 2020-05-30 MED ORDER — AMLODIPINE BESYLATE 2.5 MG PO TABS: 2.5000 mg | ORAL_TABLET | Freq: Every day | ORAL | 3 refills | Status: DC

## 2020-05-30 NOTE — Patient Outreach (Signed)
[facility]) Care Management [facility] CM Telephone Outreach PCP completed Transition of Care follow up post- SNF discharge Post- SNF discharge day # 16 Unsuccessful (consecutive) outreach attempt # 3- previously engaged patient  05/30/2020  [name] 07-Jan-1931 [id number]  Unsuccessful (consecutive) telephone outreach attempt to [name], 86 y/o female referred to [place] 05/14/20 by [facility] liaison after patient experienced hospitalization July 18- 24, 2021 for syncope with collapse; patient was diagnosed with SAH/ TBI.  She was discharged to [facility]/ inpatient rehabilitation unit and subsequently discharged home to self care on May 14, 2020 with home health services in place through [facility] for PT/ OT/ and ST.  Patient has history including, but not limited to, HTN/ HLD; CKD- III; glaucoma; and breast CA.  Attempted calls to both numbers listed for patient in EHR; unable to leave message on phone listed as home number;   HIPAA compliant voice mail message left for patient on number listed as cell, requesting return call back.  Plan:  Verified [facility] CM unsuccessful patient outreach letter in mail requesting call back in writing on May 25, 2020  Will re-attempt [facility] CM telephone outreach in 3 weeks if I do not hear back from patient first   [name], RN, BSN, [employer] Coordinator [facility] Care Management  [phone]

## 2020-05-30 NOTE — Telephone Encounter (Signed)
Caller: Shelda Pal   Call Back # [phone]

## 2020-05-30 NOTE — Telephone Encounter (Signed)
Caller : [name]  Call Back # [phone]   Per [name], she would like to make an update to her  verbal order request.   She is requesting order for Speech therapy   Once a week for 2 weeks  Twice a week for 4 weeks  Once a week for 4 weeks

## 2020-05-30 NOTE — Patient Outreach (Signed)
[facility]) Care Management [place] Coordination, Case Closure  05/30/2020  KAYTLAN BEHRMAN Jun 27, 1931 [id number]  [place] CM Telephone Outreach Care Coordination re:   Klaira Pesci, 86 y/o female referred to [facility] RN CM 05/14/20 by [facility] liaison after patient experienced hospitalization July 18- 24, 2021 for syncope with collapse; patient was diagnosed with SAH/ TBI. She was discharged to [facility]/ inpatient rehabilitation unit and subsequently discharged home to self care on May 14, 2020 with home health services in place through [facility] for PT/ OT/ and ST. Patient has history including, but not limited to, HTN/ HLD; CKD- III; glaucoma; and breast CA.  After placing unsuccessful outreach to patient this morning, received voice mail message in return from patient's daughter, Park Liter, on [place], requesting call back to clarify purpose of call to patient earlier this morning.  Called daughter back within 1.5 hours of her call to me; HIPAA compliant voice mail message left for patient's daughter, requesting return call back.  3:15 pm:  Daughter/ caregiver [name] on [place] returned my call from earlier today; HIPAA/ identity verified.  Clarified with daughter [facility] CM services/ program as a benefit of patient's insurance; daughter reports that patient had forgotten about our previous phone call 05/15/20 and reports that she is a Glass blower/designer at [employer]; reports that she believes patient will be overwhelmed in participating in [place] program and she states that she is currently coordinating care for patient.  She re-confirms that patient has no care coordination/ community resource/ pharmacy needs and also adds that home health team remains active in patient's ongoing care post- recent hospitalization.  Caregiver declines ongoing participation in program herself, stating "too busy" caring  for patient and also working as a Glass blower/designer.  Caregiver/ daughter pleasantly requests [facility] CM case closure so patient does not become overwhelmed.  Caregiver denies further issues, concerns, or problems today.  I provided/ confirmed my direct phone number, the main [facility] CM office phone number, and the [facility] CM 24-hour nurse advice phone number should issues arise in the future and patient and/or caregiver wish to participate in [place] program.  Plan:  Will make patient inactive with [facility] CM program as requested by caregiver/ daughter and will make patient's PCP aware of case closure  [name], RN, BSN, [place] Care Management  [phone]

## 2020-05-31 NOTE — Telephone Encounter (Signed)
Verbal orders given  

## 2020-06-01 NOTE — Telephone Encounter (Signed)
Caller : Manuela Schwartz  Call Back # [phone]  Requesting Verbal Orders for OT  Frequency : twice a week for three weeks  Once a week for 1 week

## 2020-06-01 NOTE — Telephone Encounter (Signed)
Verbal orders given to [place].

## 2020-06-07 DIAGNOSIS — D23111 Other benign neoplasm of skin of right upper eyelid, including canthus: Secondary | ICD-10-CM | POA: Diagnosis not present

## 2020-06-07 DIAGNOSIS — H00021 Hordeolum internum right upper eyelid: Secondary | ICD-10-CM | POA: Diagnosis not present

## 2020-06-07 DIAGNOSIS — H401132 Primary open-angle glaucoma, bilateral, moderate stage: Secondary | ICD-10-CM | POA: Diagnosis not present

## 2020-06-07 DIAGNOSIS — H43392 Other vitreous opacities, left eye: Secondary | ICD-10-CM | POA: Diagnosis not present

## 2020-06-15 ENCOUNTER — Other Ambulatory Visit: Payer: Self-pay

## 2020-06-15 ENCOUNTER — Ambulatory Visit: Payer: Medicare Other | Admitting: Neurology

## 2020-06-15 ENCOUNTER — Encounter: Payer: Self-pay | Admitting: Neurology

## 2020-06-15 VITALS — BP 177/82 | HR 73 | Ht 63.5 in | Wt 108.0 lb

## 2020-06-15 DIAGNOSIS — S069X9D Unspecified intracranial injury with loss of consciousness of unspecified duration, subsequent encounter: Secondary | ICD-10-CM

## 2020-06-15 DIAGNOSIS — I609 Nontraumatic subarachnoid hemorrhage, unspecified: Secondary | ICD-10-CM

## 2020-06-15 DIAGNOSIS — R42 Dizziness and giddiness: Secondary | ICD-10-CM | POA: Diagnosis not present

## 2020-06-15 NOTE — Patient Instructions (Signed)
MR angiogram head  48-hour holter monitor  Follow-up with cardiology  Return to clinic in 6 months
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
## 2020-06-19 NOTE — Addendum Note (Signed)
Addended by: Armen Pickup A on: 06/19/2020 01:12 PM   Modules accepted: Orders

## 2020-06-20 ENCOUNTER — Telehealth: Payer: Self-pay

## 2020-06-20 ENCOUNTER — Ambulatory Visit: Payer: Self-pay | Admitting: *Deleted

## 2020-06-20 ENCOUNTER — Telehealth: Payer: Self-pay | Admitting: Cardiology

## 2020-06-20 DIAGNOSIS — R42 Dizziness and giddiness: Secondary | ICD-10-CM

## 2020-06-20 NOTE — Telephone Encounter (Signed)
Patient returned call and was advised of new referral sent to her Cardiologist. Patient was provided information for [facility] to f/u with her referral. Patient is voiced understanding.

## 2020-06-20 NOTE — Telephone Encounter (Signed)
New Referral needed. Patient has a Cardiologist [facility]

## 2020-06-20 NOTE — Telephone Encounter (Signed)
Patient is calling in regards to the heart monitor order that was put in by Dr. Posey Pronto. Please advise.

## 2020-06-20 NOTE — Telephone Encounter (Signed)
Called patient and left a message for a call back. Need to inform patient that 48 hour Holter order was canceled and another referral order was sent to patients Cardiologist on file [facility].

## 2020-06-21 ENCOUNTER — Telehealth: Payer: Self-pay | Admitting: Radiology

## 2020-06-21 NOTE — Telephone Encounter (Signed)
Enrolled patient for a 3 day Zio XT monitor to be mailed to patients home  

## 2020-06-22 DIAGNOSIS — N1831 Chronic kidney disease, stage 3a: Secondary | ICD-10-CM | POA: Diagnosis not present

## 2020-06-22 DIAGNOSIS — K5900 Constipation, unspecified: Secondary | ICD-10-CM | POA: Diagnosis not present

## 2020-06-22 DIAGNOSIS — R41841 Cognitive communication deficit: Secondary | ICD-10-CM | POA: Diagnosis not present

## 2020-06-22 DIAGNOSIS — E039 Hypothyroidism, unspecified: Secondary | ICD-10-CM | POA: Diagnosis not present

## 2020-06-22 DIAGNOSIS — M810 Age-related osteoporosis without current pathological fracture: Secondary | ICD-10-CM | POA: Diagnosis not present

## 2020-06-22 DIAGNOSIS — E785 Hyperlipidemia, unspecified: Secondary | ICD-10-CM | POA: Diagnosis not present

## 2020-06-22 DIAGNOSIS — I081 Rheumatic disorders of both mitral and tricuspid valves: Secondary | ICD-10-CM | POA: Diagnosis not present

## 2020-06-22 DIAGNOSIS — M1990 Unspecified osteoarthritis, unspecified site: Secondary | ICD-10-CM | POA: Diagnosis not present

## 2020-06-22 DIAGNOSIS — S066X9D Traumatic subarachnoid hemorrhage with loss of consciousness of unspecified duration, subsequent encounter: Secondary | ICD-10-CM | POA: Diagnosis not present

## 2020-06-22 DIAGNOSIS — G4700 Insomnia, unspecified: Secondary | ICD-10-CM | POA: Diagnosis not present

## 2020-06-22 DIAGNOSIS — S0003XD Contusion of scalp, subsequent encounter: Secondary | ICD-10-CM | POA: Diagnosis not present

## 2020-06-22 DIAGNOSIS — I129 Hypertensive chronic kidney disease with stage 1 through stage 4 chronic kidney disease, or unspecified chronic kidney disease: Secondary | ICD-10-CM | POA: Diagnosis not present

## 2020-06-22 DIAGNOSIS — S0211HD Other fracture of occiput, left side, subsequent encounter for fracture with routine healing: Secondary | ICD-10-CM | POA: Diagnosis not present

## 2020-06-22 DIAGNOSIS — I7300 Raynaud's syndrome without gangrene: Secondary | ICD-10-CM | POA: Diagnosis not present

## 2020-06-22 DIAGNOSIS — K2270 Barrett's esophagus without dysplasia: Secondary | ICD-10-CM | POA: Diagnosis not present

## 2020-06-22 DIAGNOSIS — D62 Acute posthemorrhagic anemia: Secondary | ICD-10-CM | POA: Diagnosis not present

## 2020-06-25 ENCOUNTER — Other Ambulatory Visit (INDEPENDENT_AMBULATORY_CARE_PROVIDER_SITE_OTHER): Payer: Medicare Other

## 2020-06-25 DIAGNOSIS — R42 Dizziness and giddiness: Secondary | ICD-10-CM | POA: Diagnosis not present

## 2020-06-28 ENCOUNTER — Telehealth: Payer: Self-pay | Admitting: Family Medicine

## 2020-06-28 NOTE — Telephone Encounter (Signed)
Elisa is requesting to omit a visit with Speech this week per patient request.   Bettina Gavia is requesting a verbal order , call back @ [phone]

## 2020-06-29 ENCOUNTER — Ambulatory Visit
Admission: RE | Admit: 2020-06-29 | Discharge: 2020-06-29 | Disposition: A | Discharge status: Home / Self Care | Payer: Medicare Other | Source: Ambulatory Visit | Attending: Neurology | Admitting: Neurology

## 2020-06-29 ENCOUNTER — Other Ambulatory Visit: Payer: Self-pay

## 2020-06-29 DIAGNOSIS — R42 Dizziness and giddiness: Secondary | ICD-10-CM | POA: Diagnosis not present

## 2020-06-29 DIAGNOSIS — I609 Nontraumatic subarachnoid hemorrhage, unspecified: Secondary | ICD-10-CM

## 2020-06-29 DIAGNOSIS — S069X9D Unspecified intracranial injury with loss of consciousness of unspecified duration, subsequent encounter: Secondary | ICD-10-CM

## 2020-06-29 NOTE — Telephone Encounter (Signed)
Verbal orders given  

## 2020-07-05 DIAGNOSIS — R42 Dizziness and giddiness: Secondary | ICD-10-CM | POA: Diagnosis not present

## 2020-07-17 ENCOUNTER — Telehealth: Payer: Self-pay | Admitting: Cardiology

## 2020-07-17 NOTE — Telephone Encounter (Signed)
Spoke with the patient's daughter and advised her to reach out to Dr. Posey Pronto who ordered the monitor for the results. I offered to schedule the patient for an appointment with Dr. Harrell Gave however she states that they would like to wait until the monitor results are back to see if a follow up is needed.

## 2020-07-17 NOTE — Telephone Encounter (Signed)
   Pt's daughter [name] calling, she would like to follow up heart monitor if result is available. She also wanted to ask if Dr. Harrell Gave will see her for f/u

## 2020-07-20 ENCOUNTER — Telehealth: Payer: Self-pay | Admitting: Neurology

## 2020-07-20 NOTE — Telephone Encounter (Signed)
Left message results not available at this time.

## 2020-07-20 NOTE — Telephone Encounter (Signed)
Pt wants to know what the results were from the heart monitor please call

## 2020-07-24 NOTE — Patient Instructions (Addendum)
It was wonderful to see you again today!  We will set you up for a bone density scan this winter Flu shot today We will check labs for you today We will also set you up for a pelvic ultrasound to check on your ovaries as well!   Please see me in about 6 months

## 2020-07-24 NOTE — Progress Notes (Addendum)
[name] at [facility] [address], [place], [place] [address] [phone] [id number]  Date:  07/26/2020   Name:  [name]   DOB:  31-Dec-1930   MRN:  [id number]  PCP:  Darreld Mclean, MD    Chief Complaint: Fall (follow up need spots in head checked from fall) and Flu Vaccine   History of Present Illness:  [name] is a 86 y.o. very pleasant female patient who presents with the following:  Short term follow-up visit today Last seen by myself in August of this year after she was admitted (after a fall) with orbital fracture, subarachnoid and intraparenchymal hemorrhage.  She was also found to have a new thoracic compression fracture at that time Her husband [name] is doing ok- they have their own home. 2 daughters live fairly close to her Her husband does drive still In August Thurma was getting better but not yet back to baseline after her fall- asked her to see me in 2 months for follow-up  Her thoracic fracture can still be painful ,but is getting better.  If she will set and rest it goes away generally  She is getting back into her walking routine- she is back up to about  She will still get an occasional headache from her head injury  She did also have covid 19 in January of this year She got her covid series as well - she got J and J  Flu vaccine- will give her today   She does feel like she has some bloating and pelvic discomfort- she wonders about ovarian cancer She has noticed this for a few months She has gained a few lbs intentionally  She did have a partial hyst with one ovary remaining. History of ovarian cyst?   Lab Results  Component Value Date   TSH 4.079 04/30/2020      Patient Active Problem List   Diagnosis Date Noted  . Intracranial hemorrhage ([place])   . Acute blood loss anemia   . TBI (traumatic brain injury) ([place]) 05/04/2020  . Back pain 05/04/2020  . SAH (subarachnoid hemorrhage) ([place])  04/29/2020  . Fall at home, initial encounter 04/29/2020  . Orbital fracture, closed, initial encounter ([place]) 04/29/2020  . Basal cell carcinoma (BCC) in situ of skin 04/04/2020  . Essential hypertension 03/21/2019  . Stage 3a chronic kidney disease   . Systolic murmur 18/84/1660  . Hypercalcemia   . AKI (acute kidney injury) ([place])   . Dizziness 02/22/2019  . Squamous cell carcinoma in situ of skin 11/30/2018  . Diarrhea 09/08/2017  . Nausea vomiting and diarrhea 09/08/2017  . Elevated serum creatinine 09/08/2017  . PVC (premature ventricular contraction) 09/08/2017  . Glaucoma 09/08/2017  . Hx of meningioma of the brain 08/28/2017  . Hoarseness of voice 02/28/2016  . Raynaud's phenomenon 02/28/2016  . Esophageal spasm 09/20/2013  . Osteoporosis 03/18/2012  . Fracture, shoulder 03/06/2012  . Hyperlipidemia   . Breast cancer [facility])     Past Medical History:  Diagnosis Date  . Autoimmune disease ([place])   . Barrett's esophagus   . Bone lesion    sacrum  . Breast cancer ([place]) 2010   cT1 N0 M0; ER+/ PR+/ HER2 neg; s/p lumpectomy 07/2010; started adjuvant hormonal therapy 10/2009  . Cervical cancer ([place])   . Dysphagia   . Fx distal radius NEC-closed   . Glaucoma   . Humerus fracture 03/2012   impacted left humueral  neck fracture, treated by Dr. Amedeo Plenty  . Hyperlipidemia   . Hypertension   . Osteoporosis 03/18/2012  . Pneumonia   . Thyroid disease     Past Surgical History:  Procedure Laterality Date  . ABDOMINAL HYSTERECTOMY  1972   cancer          1 tube 1 ovary  . APPENDECTOMY    . BREAST LUMPECTOMY Left 2010  . colon polyp removal    . EYE SURGERY    . HAND SURGERY    . ortho procedures     multiple  . OVARIAN CYST REMOVAL  1978  . SHOULDER SURGERY  1954   left    Social History   Tobacco Use  . Smoking status: Former Smoker    Quit date: 12/16/1986    Years since quitting: 33.6  . Smokeless tobacco: Never Used  . Tobacco comment: FORMER SMOKER 30 YEARS AGO   Vaping Use  . Vaping Use: Never used  Substance Use Topics  . Alcohol use: Yes    Alcohol/week: 0.0 standard drinks    Comment: OCCASIONALLY  . Drug use: No    Family History  Problem Relation Age of Onset  . Heart disease Father   . Cancer Sister        lung, kidney  . Cancer Brother        brain  . Cancer Paternal Aunt        breast  . Breast cancer Paternal Aunt   . Cancer Brother        liver, lung  . Osteoporosis Neg Hx     Allergies  Allergen Reactions  . Daypro [Oxaprozin] Swelling    Face and tongue  . Prolia [Denosumab]     unknown    Medication list has been reviewed and updated.  Current Outpatient Medications on File Prior to Visit  Medication Sig Dispense Refill  . Calcium Carbonate-Vitamin D (CALCIUM + D PO) Take 1 capsule by mouth daily.     Marland Kitchen latanoprost (XALATAN) 0.005 % ophthalmic solution Place 1 drop into both eyes at bedtime.     Marland Kitchen levothyroxine (SYNTHROID) 50 MCG tablet TAKE 1 TABLET BY MOUTH DAILY BEFORE BREAKFAST 90 tablet 3  . Multiple Vitamin (MULTIVITAMIN WITH MINERALS) TABS tablet Take 1 tablet by mouth daily.    . naproxen sodium (ALEVE) 220 MG tablet Take 220 mg by mouth daily as needed.    . rosuvastatin (CRESTOR) 10 MG tablet Take 1 tablet (10 mg total) by mouth daily. TAKE 1 BY MOUTH DAILY 90 tablet 1   No current facility-administered medications on file prior to visit.    Review of Systems:  As per HPI- otherwise negative.   Physical Examination: Vitals:   07/26/20 1128 07/26/20 1137  BP: (!) 142/82 132/82  Pulse: 82   SpO2: 99%    Vitals:   07/26/20 1128  Weight: 107 lb 6.4 oz (48.7 kg)   Body mass index is 18.73 kg/m. Ideal Body Weight:    GEN: no acute distress.  Petite build, looks well  HEENT: Atraumatic, Normocephalic.  Ears and Nose: No external deformity. CV: RRR, No M/G/R. No JVD. No thrill. No extra heart sounds. PULM: CTA B, no wheezes, crackles, rhonchi. No retractions. No resp. distress. No  accessory muscle use. ABD: S, NT, ND. No rebound. No HSM. EXTR: No c/c/e PSYCH: Normally interactive. Conversant.  Patient points out a couple of likely seborrheic keratoses in her left hairline.  I advised her that I do  not think these are skin cancer, but I would encourage her to see her dermatologist as she does periodically   Assessment and Plan: Essential hypertension - Plan: CBC, Comprehensive metabolic panel  Estrogen deficiency - Plan: DG Bone Density  History of healed fragility fracture - Plan: DG Bone Density  Pelvic pressure in female - Plan: US Pelvic Complete With Transvaginal  Flu vaccine today BP is under control Update bone density and labs Order pelvic US- pt notes concern of possible bloating and had read about ovarian cancer, wants to make sure all is well  Will plan further follow- up pending labs. Plan to visit in 6 months  This visit occurred during the SARS-CoV-2 public health emergency.  Safety protocols were in place, including screening questions prior to the visit, additional usage of staff PPE, and extensive cleaning of exam room while observing appropriate contact time as indicated for disinfecting solutions.    Signed Lamar Blinks, MD   Addendum 10/15, received her labs as below, message to patient  Results for orders placed or performed in visit on 07/26/20  CBC  Result Value Ref Range   WBC 7.1 3.8 - 10.8 Thousand/uL   RBC 4.38 3.80 - 5.10 Million/uL   Hemoglobin 12.8 11.7 - 15.5 g/dL   HCT 39.4 35 - 45 %   MCV 90.0 80.0 - 100.0 fL   MCH 29.2 27.0 - 33.0 pg   MCHC 32.5 32.0 - 36.0 g/dL   RDW 14.5 11.0 - 15.0 %   Platelets 282 140 - 400 Thousand/uL   MPV 11.4 7.5 - 12.5 fL  Comprehensive metabolic panel  Result Value Ref Range   Glucose, Bld 105 (H) 65 - 99 mg/dL   BUN 15 7 - 25 mg/dL   Creat 1.03 (H) 0.60 - 0.88 mg/dL   BUN/Creatinine Ratio 15 6 - 22 (calc)   Sodium 139 135 - 146 mmol/L   Potassium 4.9 3.5 - 5.3 mmol/L   Chloride  102 98 - 110 mmol/L   CO2 30 20 - 32 mmol/L   Calcium 10.7 (H) 8.6 - 10.4 mg/dL   Total Protein 6.9 6.1 - 8.1 g/dL   Albumin 4.1 3.6 - 5.1 g/dL   Globulin 2.8 1.9 - 3.7 g/dL (calc)   AG Ratio 1.5 1.0 - 2.5 (calc)   Total Bilirubin 0.4 0.2 - 1.2 mg/dL   Alkaline phosphatase (APISO) 98 37 - 153 U/L   AST 24 10 - 35 U/L   ALT 15 6 - 29 U/L

## 2020-07-26 ENCOUNTER — Telehealth: Payer: Self-pay | Admitting: Family Medicine

## 2020-07-26 ENCOUNTER — Encounter: Payer: Self-pay | Admitting: Family Medicine

## 2020-07-26 ENCOUNTER — Ambulatory Visit: Payer: Medicare Other | Admitting: Family Medicine

## 2020-07-26 ENCOUNTER — Other Ambulatory Visit: Payer: Self-pay

## 2020-07-26 VITALS — BP 132/82 | HR 82 | Wt 107.4 lb

## 2020-07-26 DIAGNOSIS — R102 Pelvic and perineal pain: Secondary | ICD-10-CM | POA: Diagnosis not present

## 2020-07-26 DIAGNOSIS — I10 Essential (primary) hypertension: Secondary | ICD-10-CM | POA: Diagnosis not present

## 2020-07-26 DIAGNOSIS — Z87310 Personal history of (healed) osteoporosis fracture: Secondary | ICD-10-CM

## 2020-07-26 DIAGNOSIS — Z23 Encounter for immunization: Secondary | ICD-10-CM

## 2020-07-26 DIAGNOSIS — E2839 Other primary ovarian failure: Secondary | ICD-10-CM

## 2020-07-26 LAB — COMPREHENSIVE METABOLIC PANEL
AG Ratio: 1.5 (calc) (ref 1.0–2.5)
ALT: 15 U/L (ref 6–29)
AST: 24 U/L (ref 10–35)
Albumin: 4.1 g/dL (ref 3.6–5.1)
Alkaline phosphatase (APISO): 98 U/L (ref 37–153)
BUN/Creatinine Ratio: 15 (calc) (ref 6–22)
BUN: 15 mg/dL (ref 7–25)
CO2: 30 mmol/L (ref 20–32)
Calcium: 10.7 mg/dL — ABNORMAL HIGH (ref 8.6–10.4)
Chloride: 102 mmol/L (ref 98–110)
Creat: 1.03 mg/dL — ABNORMAL HIGH (ref 0.60–0.88)
Globulin: 2.8 g/dL (calc) (ref 1.9–3.7)
Glucose, Bld: 105 mg/dL — ABNORMAL HIGH (ref 65–99)
Potassium: 4.9 mmol/L (ref 3.5–5.3)
Sodium: 139 mmol/L (ref 135–146)
Total Bilirubin: 0.4 mg/dL (ref 0.2–1.2)
Total Protein: 6.9 g/dL (ref 6.1–8.1)

## 2020-07-26 LAB — CBC
HCT: 39.4 % (ref 35.0–45.0)
Hemoglobin: 12.8 g/dL (ref 11.7–15.5)
MCH: 29.2 pg (ref 27.0–33.0)
MCHC: 32.5 g/dL (ref 32.0–36.0)
MCV: 90 fL (ref 80.0–100.0)
MPV: 11.4 fL (ref 7.5–12.5)
Platelets: 282 10*3/uL (ref 140–400)
RBC: 4.38 10*6/uL (ref 3.80–5.10)
RDW: 14.5 % (ref 11.0–15.0)
WBC: 7.1 10*3/uL (ref 3.8–10.8)

## 2020-07-26 NOTE — Addendum Note (Signed)
Addended by: Manuela Schwartz on: 07/26/2020 11:52 AM   Modules accepted: Orders

## 2020-07-26 NOTE — Telephone Encounter (Signed)
Medication has been added to list.

## 2020-07-26 NOTE — Telephone Encounter (Signed)
Patient called to check on the status of the heart monitor results.  Additionally, she'd like to speak with the nurse about when she should next be seen.   She has an appointment for 12/13/20 at 8:50 AM with Dr. Posey Pronto for 6 month follow up but wants to be sure she's on track.

## 2020-07-26 NOTE — Telephone Encounter (Signed)
Called patient and informed her that her heart monitor results have not been released. Informed patient that I will send a message to Dr. Posey Pronto to check on results on her end. Also informed patient that she is on track with her appointments as her last appointment was on 06/15/20 and was to f/u in 6 months which has been scheduled. Patient verbalized understanding.

## 2020-07-26 NOTE — Telephone Encounter (Signed)
Caller Grant Henkes  Call Back # [phone]  Pt states seen today and noticed on her medication list, that we did not have her blood pressure medication on the list. Please add  amlodipine besylate 2.5 MG . She takes one tablet by mouth once daily

## 2020-07-27 ENCOUNTER — Other Ambulatory Visit: Payer: Self-pay | Admitting: Family Medicine

## 2020-07-27 ENCOUNTER — Encounter: Payer: Self-pay | Admitting: Family Medicine

## 2020-07-27 DIAGNOSIS — Z1231 Encounter for screening mammogram for malignant neoplasm of breast: Secondary | ICD-10-CM

## 2020-07-27 NOTE — Telephone Encounter (Signed)
Results are not finalized in epic.  Can you follow-up with cardiology to see the status? Thanks.

## 2020-07-27 NOTE — Telephone Encounter (Signed)
Called [facility] and was on hold for 10 minutes before I was hung up on.

## 2020-07-30 ENCOUNTER — Telehealth: Payer: Self-pay | Admitting: Cardiology

## 2020-07-30 NOTE — Telephone Encounter (Signed)
Mahina from [facility] states Dr. Posey Pronto is unable to see the monitor results, because they have not been released yet.

## 2020-07-30 NOTE — Telephone Encounter (Signed)
Called [facility] and spoke to selena? Informed her that Dr. Posey Pronto is unable to provide patient with results as it has not been released. A message has been sent to the Nurses to release results.

## 2020-07-31 ENCOUNTER — Telehealth: Payer: Self-pay | Admitting: Neurology

## 2020-07-31 NOTE — Telephone Encounter (Signed)
Patient is calling for the second time for her heart monitor results. See closed telephone encounter from 07/30/20 for more details.

## 2020-07-31 NOTE — Telephone Encounter (Signed)
Called patient and there was no voicemail set up, and phone just rang. Need to inform patient that we are waiting for a call back from Cardiology. Spoke to Cardiology on the phone yesterday and they have forwarded a message to have results released so that Dr. Posey Pronto may give results to patient.

## 2020-08-02 ENCOUNTER — Ambulatory Visit (HOSPITAL_BASED_OUTPATIENT_CLINIC_OR_DEPARTMENT_OTHER): Payer: Medicare Other

## 2020-08-02 NOTE — Telephone Encounter (Signed)
Called and spoke to patient. Informed her of results and reccommendations per Dr. Tomi Likens. Informed patient that she should f/u with her Cardiologist. Patient verbalized understanding and will definitely do so.

## 2020-08-02 NOTE — Telephone Encounter (Signed)
Results given to Dr. Tomi Likens to review.

## 2020-08-02 NOTE — Telephone Encounter (Signed)
There is no finalized report with this.  However, it shows brief runs of elevated heart rate.  It may be of no significance but I don't know if it correlates with any symptoms that she may have had.  I would refer her to cardiology just to make sure that it doesn't require any further evaluation or management.

## 2020-08-07 ENCOUNTER — Encounter: Payer: Self-pay | Admitting: Family Medicine

## 2020-08-07 ENCOUNTER — Ambulatory Visit (HOSPITAL_BASED_OUTPATIENT_CLINIC_OR_DEPARTMENT_OTHER)
Admission: RE | Admit: 2020-08-07 | Discharge: 2020-08-07 | Disposition: A | Discharge status: Home / Self Care | Payer: Medicare Other | Source: Ambulatory Visit | Attending: Family Medicine | Admitting: Family Medicine

## 2020-08-07 ENCOUNTER — Other Ambulatory Visit: Payer: Self-pay

## 2020-08-07 DIAGNOSIS — Z90721 Acquired absence of ovaries, unilateral: Secondary | ICD-10-CM | POA: Diagnosis not present

## 2020-08-07 DIAGNOSIS — R102 Pelvic and perineal pain: Secondary | ICD-10-CM | POA: Insufficient documentation

## 2020-08-07 DIAGNOSIS — Z90710 Acquired absence of both cervix and uterus: Secondary | ICD-10-CM | POA: Diagnosis not present

## 2020-08-10 ENCOUNTER — Ambulatory Visit: Payer: Medicare Other | Admitting: Neurology

## 2020-08-14 ENCOUNTER — Telehealth: Payer: Self-pay | Admitting: Family Medicine

## 2020-08-14 NOTE — Telephone Encounter (Signed)
Labs have been mailed to patient  

## 2020-08-14 NOTE — Telephone Encounter (Signed)
Patient is calling to see if she can get a copy of her labs mailed to her (address verified during call). Please advise.

## 2020-08-31 ENCOUNTER — Ambulatory Visit
Admission: RE | Admit: 2020-08-31 | Discharge: 2020-08-31 | Disposition: A | Discharge status: Home / Self Care | Payer: Medicare Other | Source: Ambulatory Visit | Attending: Family Medicine | Admitting: Family Medicine

## 2020-08-31 ENCOUNTER — Other Ambulatory Visit: Payer: Self-pay

## 2020-08-31 DIAGNOSIS — Z1231 Encounter for screening mammogram for malignant neoplasm of breast: Secondary | ICD-10-CM | POA: Diagnosis not present

## 2020-09-03 ENCOUNTER — Encounter: Payer: Self-pay | Admitting: Cardiology

## 2020-09-03 ENCOUNTER — Other Ambulatory Visit: Payer: Self-pay

## 2020-09-03 ENCOUNTER — Ambulatory Visit: Payer: Medicare Other | Admitting: Cardiology

## 2020-09-03 VITALS — BP 150/80 | HR 78 | Ht 63.5 in | Wt 110.0 lb

## 2020-09-03 DIAGNOSIS — R011 Cardiac murmur, unspecified: Secondary | ICD-10-CM

## 2020-09-03 DIAGNOSIS — E7800 Pure hypercholesterolemia, unspecified: Secondary | ICD-10-CM

## 2020-09-03 DIAGNOSIS — Z7189 Other specified counseling: Secondary | ICD-10-CM

## 2020-09-03 DIAGNOSIS — I471 Supraventricular tachycardia: Secondary | ICD-10-CM

## 2020-09-03 DIAGNOSIS — I10 Essential (primary) hypertension: Secondary | ICD-10-CM | POA: Diagnosis not present

## 2020-09-03 NOTE — Progress Notes (Signed)
Cardiology Office Note:    Date:  09/03/2020   ID:  [name], DOB 08/31/1931, MRN [id number]  PCP:  Lori Mclean, MD  Cardiologist:  Lori Dresser, MD  Referring MD: Lori Mclean, MD   Chief Complaint  Patient presents with  . Follow-up  . Headache   History of Present Illness:    [name] is a 86 y.o. female with recent hospitalization for hypertensive emergency. No prior cardiac PMH but history of breast cancer, skin cancer, cervical cancer, hypothyroidism, hyperlipidemia, osteoporosis, meningioma. I met her in the hospital in 02/2019 during an admission for hypertensive emergency.  Today: Here with her daughter today.   Main issue is back pain. In significant pain today. Takes home BP only occasionally, has been normal range.   We reviewed monitor results today. Noted 57 SVT episodes in 3 days. Frequent atrial ectopy. Reviewed prior echo as well.   After her syncopal episode 04/2020, verapamil was held due to bradycardia. She was started on amlodipine for blood pressure. She wonders if she was a bit dehydrated at the time (usually can walk up to 7 miles/day).   Past Medical History:  Diagnosis Date  . Autoimmune disease ([place])   . Barrett's esophagus   . Bone lesion    sacrum  . Breast cancer ([place]) 2010   cT1 N0 M0; ER+/ PR+/ HER2 neg; s/p lumpectomy 07/2010; started adjuvant hormonal therapy 10/2009  . Cervical cancer ([place])   . Dysphagia   . Fx distal radius NEC-closed   . Glaucoma   . Humerus fracture 03/2012   impacted left humueral neck fracture, treated by Dr. Amedeo Mccoy  . Hyperlipidemia   . Hypertension   . Osteoporosis 03/18/2012  . Pneumonia   . Thyroid disease    Past Surgical History:  Procedure Laterality Date  . ABDOMINAL HYSTERECTOMY  1972   cancer          1 tube 1 ovary  . APPENDECTOMY    . BREAST LUMPECTOMY Left 2010  . colon polyp removal    . EYE SURGERY    . HAND SURGERY    . ortho procedures     multiple  . OVARIAN  CYST REMOVAL  1978  . SHOULDER SURGERY  1954   left     Current Meds  Medication Sig  . amLODipine (NORVASC) 2.5 MG tablet Take 2.5 mg by mouth daily.  . Calcium Carbonate-Vitamin D (CALCIUM + D PO) Take 1 capsule by mouth daily.   Marland Kitchen latanoprost (XALATAN) 0.005 % ophthalmic solution Place 1 drop into both eyes at bedtime.   Marland Kitchen levothyroxine (SYNTHROID) 50 MCG tablet TAKE 1 TABLET BY MOUTH DAILY BEFORE BREAKFAST  . Multiple Vitamin (MULTIVITAMIN WITH MINERALS) TABS tablet Take 1 tablet by mouth daily.  . naproxen sodium (ALEVE) 220 MG tablet Take 220 mg by mouth daily as needed.  . rosuvastatin (CRESTOR) 10 MG tablet Take 1 tablet (10 mg total) by mouth daily. TAKE 1 BY MOUTH DAILY     Allergies:   Daypro [oxaprozin] and Prolia [denosumab]   Social History   Tobacco Use  . Smoking status: Former Smoker    Quit date: 12/16/1986    Years since quitting: 33.7  . Smokeless tobacco: Never Used  . Tobacco comment: FORMER SMOKER 30 YEARS AGO  Vaping Use  . Vaping Use: Never used  Substance Use Topics  . Alcohol use: Yes    Alcohol/week: 0.0 standard drinks    Comment: OCCASIONALLY  . Drug  use: No     Family Hx: The patient's family history includes Breast cancer in her paternal aunt; Cancer in her brother, brother, paternal aunt, and sister; Heart disease in her father. There is no history of Osteoporosis.  ROS:   Please see the history of present illness. Remainder of ROS otherwise unremarkable.  Prior CV studies:   The following studies were reviewed today: Monitor 08/19/20 3 days of data recorded on Zio monitor. Patient had a min HR of 49 bpm, max HR of 197 bpm, and avg HR of 73 bpm. Predominant underlying rhythm was Sinus Rhythm. No VT, atrial fibrillation, high degree block, or pauses noted. Isolated atrial ectopy was frequent (11.2%), and ventricular ectopy was rare (<1%). 57 SVT runs occurred, longest event lasting 10.4 seconds.  There was one triggered event, which was sinus  rhythm with ectopy.  Echo 04/30/20 1. Left ventricular ejection fraction, by estimation, is 60 to 65%. The  left ventricle has normal function. The left ventricle has no regional  wall motion abnormalities. Left ventricular diastolic parameters are  indeterminate.  2. Right ventricular systolic function is normal. The right ventricular  size is normal. There is mildly elevated pulmonary artery systolic  pressure.  3. Right atrial size was moderately dilated.  4. The pericardial effusion is circumferential.  5. The mitral valve is normal in structure. Mild mitral valve  regurgitation. No evidence of mitral stenosis.  6. The aortic valve is tricuspid. Aortic valve regurgitation is not  visualized. Mild aortic valve sclerosis is present, with no evidence of  aortic valve stenosis.  7. The inferior vena cava is normal in size with <50% respiratory  variability, suggesting right atrial pressure of 8 mmHg.   Labs/Other Tests and Data Reviewed:    EKG:  An ECG dated 04/29/20 was personally reviewed today and demonstrated:  NSR  Recent Labs: 04/30/2020: TSH 4.079 05/05/2020: Magnesium 1.7 07/26/2020: ALT 15; BUN 15; Creat 1.03; Hemoglobin 12.8; Platelets 282; Potassium 4.9; Sodium 139   Recent Lipid Panel Lab Results  Component Value Date/Time   CHOL 169 05/23/2019 09:19 AM   TRIG 50.0 05/23/2019 09:19 AM   HDL 78.70 05/23/2019 09:19 AM   CHOLHDL 2 05/23/2019 09:19 AM   LDLCALC 80 05/23/2019 09:19 AM    Wt Readings from Last 3 Encounters:  09/03/20 110 lb (49.9 kg)  07/26/20 107 lb 6.4 oz (48.7 kg)  06/15/20 108 lb (49 kg)     Objective:    Vital Signs:  BP (!) 150/80 (BP Location: Left Arm, Patient Position: Sitting, Cuff Size: Normal)   Pulse 78   Ht 5' 3.5" (1.613 m)   Wt 110 lb (49.9 kg)   BMI 19.18 kg/m   GEN: Well nourished, well developed in no acute distress HEENT: Normal, moist mucous membranes NECK: No JVD CARDIAC: regular rhythm, normal S1 and S2, no rubs  or gallops. 1/6 systolic murmur. VASCULAR: Radial and DP pulses 2+ bilaterally. No carotid bruits RESPIRATORY:  Clear to auscultation without rales, wheezing or rhonchi  ABDOMEN: Soft, non-tender, non-distended MUSCULOSKELETAL:  Ambulates independently SKIN: Warm and dry, no edema NEUROLOGIC:  Alert and oriented x 3. No focal neuro deficits noted. PSYCHIATRIC:  Normal affect   ASSESSMENT & PLAN:    1. Essential hypertension   2. Pure hypercholesterolemia   3. Paroxysmal SVT (supraventricular tachycardia) ([place])   4. Murmur, cardiac   5. Cardiac risk counseling   6. Counseling on health promotion and disease prevention    Hypertension, with prior admission for  hypertensive emergency:   -labile/random episodes -verapamil was stopped after syncopal episode 04/2020 -was placed on amlodipine with generally fair control -will monitor at home and call me if BP remains elevated on home cuff  Paroxysmal SVT: -we discussed results of her monitor -reviewed echo as well -discussed options. As above, verapamil held for syncope. Discussed beta blocker. As these are not very bothersome to her, she would like to hold off on treatment at this time. Will call if she develops symptoms -instructed to go to ER if syncope recurs  Hypercholesterolemia:  -LDL was 8 mg/gL 05/23/2019 per [facility] -Given age, no indication for aspirin -on rosuvastatin 10 mg daily and tolerating.  History of systolic murmur: echo shows aortic sclerosis without stenosis  Disposition:  Follow up 6 mos  Medication Adjustments/Labs and Tests Ordered: Current medicines are reviewed at length with the patient today.  Concerns regarding medicines are outlined above.   Tests Ordered: No orders of the defined types were placed in this encounter.  Medication Changes: No orders of the defined types were placed in this encounter.   Patient Instructions  Medication Instructions:  Your Physician recommend you continue on your  current medication as directed.    *If you need a refill on your cardiac medications before your next appointment, please call your pharmacy*   Lab Work: None   Testing/Procedures: None   Follow-Up: At [facility], you and your health needs are our priority.  As part of our continuing mission to provide you with exceptional heart care, we have created designated Provider Care Teams.  These Care Teams include your primary Cardiologist (physician) and Advanced Practice Providers (APPs -  Physician Assistants and Nurse Practitioners) who all work together to provide you with the care you need, when you need it.  We recommend signing up for the patient portal called "MyChart".  Sign up information is provided on this After Visit Summary.  MyChart is used to connect with patients for Virtual Visits (Telemedicine).  Patients are able to view lab/test results, encounter notes, upcoming appointments, etc.  Non-urgent messages can be sent to your provider as well.   To learn more about what you can do with MyChart, go to [email or web address].    Your next appointment:   6 month(s)  The format for your next appointment:   In Person  Provider:   Buford Dresser, MD     Signed, Lori Dresser, MD  09/03/2020  [place]

## 2020-09-03 NOTE — Patient Instructions (Signed)

## 2020-09-11 ENCOUNTER — Telehealth: Payer: Self-pay | Admitting: Family Medicine

## 2020-09-11 DIAGNOSIS — E782 Mixed hyperlipidemia: Secondary | ICD-10-CM

## 2020-09-11 MED ORDER — ROSUVASTATIN CALCIUM 10 MG PO TABS: 10.0000 mg | ORAL_TABLET | Freq: Every day | ORAL | 1 refills | Status: DC

## 2020-09-11 NOTE — Telephone Encounter (Signed)
Medication sent to pharmacy  

## 2020-09-11 NOTE — Telephone Encounter (Signed)
Medication:  rosuvastatin (CRESTOR) 10 MG tablet [[id number]]      Has the patient contacted their pharmacy?  (If no, request that the patient contact the pharmacy for the refill.) (If yes, when and what did the pharmacy advise?)     Preferred Pharmacy (with phone number or street name):  [facility]) [place], [place] Phone:  [phone]  Fax:  [phone]          Agent: Please be advised that RX refills may take up to 3 business days. We ask that you follow-up with your pharmacy.

## 2020-09-14 ENCOUNTER — Other Ambulatory Visit (HOSPITAL_BASED_OUTPATIENT_CLINIC_OR_DEPARTMENT_OTHER): Payer: Medicare Other

## 2020-09-17 ENCOUNTER — Telehealth: Payer: Self-pay | Admitting: Family Medicine

## 2020-09-17 DIAGNOSIS — E039 Hypothyroidism, unspecified: Secondary | ICD-10-CM

## 2020-09-17 MED ORDER — LEVOTHYROXINE SODIUM 50 MCG PO TABS: ORAL_TABLET | ORAL | 3 refills | Status: DC

## 2020-09-17 NOTE — Telephone Encounter (Signed)
Medication refilled to pharmacy.  

## 2020-09-17 NOTE — Telephone Encounter (Signed)
Patient is requesting a 90 day suopply of medication sent to  [facility] :   Medication:levothyroxine (SYNTHROID) 50 MCG tablet    Has the patient contacted their pharmacy? No. (If no, request that the patient contact the pharmacy for the refill.) (If yes, when and what did the pharmacy advise?)  Preferred Pharmacy (with phone number or street name):  [facility]) [place], [place] Phone:  [phone]  Fax:  [phone]       Agent: Please be advised that RX refills may take up to 3 business days. We ask that you follow-up with your pharmacy.

## 2020-09-24 DIAGNOSIS — L820 Inflamed seborrheic keratosis: Secondary | ICD-10-CM | POA: Diagnosis not present

## 2020-09-24 DIAGNOSIS — Z85828 Personal history of other malignant neoplasm of skin: Secondary | ICD-10-CM | POA: Diagnosis not present

## 2020-09-27 DIAGNOSIS — H401132 Primary open-angle glaucoma, bilateral, moderate stage: Secondary | ICD-10-CM | POA: Diagnosis not present

## 2020-09-27 DIAGNOSIS — D23111 Other benign neoplasm of skin of right upper eyelid, including canthus: Secondary | ICD-10-CM | POA: Diagnosis not present

## 2020-09-27 DIAGNOSIS — H00021 Hordeolum internum right upper eyelid: Secondary | ICD-10-CM | POA: Diagnosis not present

## 2020-10-15 DIAGNOSIS — M4854XA Collapsed vertebra, not elsewhere classified, thoracic region, initial encounter for fracture: Secondary | ICD-10-CM | POA: Diagnosis not present

## 2020-10-15 DIAGNOSIS — M5459 Other low back pain: Secondary | ICD-10-CM | POA: Diagnosis not present

## 2020-10-15 DIAGNOSIS — M4856XA Collapsed vertebra, not elsewhere classified, lumbar region, initial encounter for fracture: Secondary | ICD-10-CM | POA: Diagnosis not present

## 2020-11-07 DIAGNOSIS — M545 Low back pain, unspecified: Secondary | ICD-10-CM | POA: Diagnosis not present

## 2020-11-13 ENCOUNTER — Telehealth: Payer: Self-pay | Admitting: Family Medicine

## 2020-11-13 DIAGNOSIS — M4856XG Collapsed vertebra, not elsewhere classified, lumbar region, subsequent encounter for fracture with delayed healing: Secondary | ICD-10-CM | POA: Diagnosis not present

## 2020-11-13 DIAGNOSIS — M4854XG Collapsed vertebra, not elsewhere classified, thoracic region, subsequent encounter for fracture with delayed healing: Secondary | ICD-10-CM | POA: Diagnosis not present

## 2020-11-13 NOTE — Telephone Encounter (Signed)
Received form. Placed in provider folder for review and signature.

## 2020-11-13 NOTE — Telephone Encounter (Signed)
Pt dropped off document to be filled out by provider (Surgical Clearance for [facility]- 1 page) Pt stated provider is aware of issue and  Pt would like document to be faxed when ready to [phone]. Document put at front office tray under providers name.

## 2020-11-14 ENCOUNTER — Ambulatory Visit: Payer: Self-pay | Admitting: *Deleted

## 2020-11-15 ENCOUNTER — Ambulatory Visit: Payer: Self-pay | Admitting: Orthopedic Surgery

## 2020-11-15 NOTE — Telephone Encounter (Signed)
Patient is calling checking the status of paperwork

## 2020-11-19 ENCOUNTER — Encounter: Payer: Self-pay | Admitting: Family Medicine

## 2020-11-19 ENCOUNTER — Telehealth: Payer: Medicare Other | Admitting: Family Medicine

## 2020-11-19 NOTE — Telephone Encounter (Signed)
Per cardiology no further w/u needed prior to surgery.  Called pt- she notes no changes in her health, no CP Will clear for her procedure per Dr Rolena Infante

## 2020-11-20 ENCOUNTER — Ambulatory Visit: Payer: Self-pay | Admitting: Orthopedic Surgery

## 2020-11-20 NOTE — Telephone Encounter (Signed)
Caller: Seth Bake ([facility]) Call back # [phone]  Checking the status of surgical clearance form

## 2020-11-20 NOTE — H&P (Signed)
Subjective:   T12-L1 kypho [facility] 11/28/20  Patient Active Problem List   Diagnosis Date Noted  . Intracranial hemorrhage (HCC)   . Acute blood loss anemia   . TBI (traumatic brain injury) (HCC) 05/04/2020  . Back pain 05/04/2020  . SAH (subarachnoid hemorrhage) (HCC) 04/29/2020  . Fall at home, initial encounter 04/29/2020  . Orbital fracture, closed, initial encounter (HCC) 04/29/2020  . Basal cell carcinoma (BCC) in situ of skin 04/04/2020  . Essential hypertension 03/21/2019  . Stage 3a chronic kidney disease   . Systolic murmur 02/23/2019  . Hypercalcemia   . AKI (acute kidney injury) (HCC)   . Dizziness 02/22/2019  . Squamous cell carcinoma in situ of skin 11/30/2018  . Diarrhea 09/08/2017  . Nausea vomiting and diarrhea 09/08/2017  . Elevated serum creatinine 09/08/2017  . PVC (premature ventricular contraction) 09/08/2017  . Glaucoma 09/08/2017  . Hx of meningioma of the brain 08/28/2017  . Hoarseness of voice 02/28/2016  . Raynaud's phenomenon 02/28/2016  . Esophageal spasm 09/20/2013  . Osteoporosis 03/18/2012  . Fracture, shoulder 03/06/2012  . Hyperlipidemia   . Breast cancer (HCC)    Past Medical History:  Diagnosis Date  . Autoimmune disease (HCC)   . Barrett's esophagus   . Bone lesion    sacrum  . Breast cancer ([facility]) 2010   cT1 N0 M0; ER+/ PR+/ HER2 neg; s/p lumpectomy 07/2010; started adjuvant hormonal therapy 10/2009  . Cervical cancer (HCC)   . Dysphagia   . Fx distal radius NEC-closed   . Glaucoma   . Humerus fracture 03/2012   impacted left humueral neck fracture, treated by Dr. Gramig  . Hyperlipidemia   . Hypertension   . Osteoporosis 03/18/2012  . Pneumonia   . Thyroid disease     Past Surgical History:  Procedure Laterality Date  . ABDOMINAL HYSTERECTOMY  1972   cancer          1 tube 1 ovary  . APPENDECTOMY    . BREAST LUMPECTOMY Left 2010  . colon polyp removal    . EYE SURGERY    . HAND SURGERY    . ortho procedures      multiple  . OVARIAN CYST REMOVAL  1978  . SHOULDER SURGERY  1954   left    Current Outpatient Medications  Medication Sig Dispense Refill Last Dose  . amLODipine (NORVASC) 2.5 MG tablet Take 2.5 mg by mouth daily.     . Calcium Carbonate-Vitamin D (CALCIUM + D PO) Take 1 capsule by mouth daily.      . latanoprost (XALATAN) 0.005 % ophthalmic solution Place 1 drop into both eyes at bedtime.      . levothyroxine (SYNTHROID) 50 MCG tablet TAKE 1 TABLET BY MOUTH DAILY BEFORE BREAKFAST 90 tablet 3   . Multiple Vitamin (MULTIVITAMIN WITH MINERALS) TABS tablet Take 1 tablet by mouth daily.     . naproxen sodium (ALEVE) 220 MG tablet Take 220 mg by mouth daily as needed.     . rosuvastatin (CRESTOR) 10 MG tablet Take 1 tablet (10 mg total) by mouth daily. TAKE 1 BY MOUTH DAILY 90 tablet 1    No current facility-administered medications for this visit.   Allergies  Allergen Reactions  . Daypro [Oxaprozin] Swelling    Face and tongue  . Prolia [Denosumab]     unknown    Social History   Tobacco Use  . Smoking status: Former Smoker    Quit date: 12/16/1986    Years since   quitting: 33.9  . Smokeless tobacco: Never Used  . Tobacco comment: FORMER SMOKER 30 YEARS AGO  Substance Use Topics  . Alcohol use: Yes    Alcohol/week: 0.0 standard drinks    Comment: OCCASIONALLY    Family History  Problem Relation Age of Onset  . Heart disease Father   . Cancer Sister        lung, kidney  . Cancer Brother        brain  . Cancer Paternal Aunt        breast  . Breast cancer Paternal Aunt   . Cancer Brother        liver, lung  . Osteoporosis Neg Hx     Review of Systems Pertinent items are noted in HPI.  Objective:   Vitals: Ht: 4 ft 11 in 11/20/2020 08:02 am BP: 139/91 11/20/2020 08:06 am Pulse: 77 bpm 11/20/2020 08:07 am  Patient is an 87 year old female.  General: AAOx3, NAD  Heart: RRR, no rubs, murmers, or gallops  Lungs: CTAB  Abdomen: BSx4, non-tneder, non-distended, no  rebound tenderness.  [name] continues to have significant thoracolumbar pain with palpation and when she stands to ambulate 5/5 motor strength in lower extremity. Negative nerve root tension sign, no clonus. Significant pain with direct palpation of the thoracolumbar spine that radiates into the paraspinal region.  Lumbar MRI: completed on 11/07/20 was reviewed with the patient. It was completed at [facility]; I have independently reviewed the images as well as the radiology report. Acute T12 compression fracture with 60% loss of anterior height. Slightly more chronic appearing L1 compression fracture with 50% loss of anterior height. Mild to moderate degenerative changes throughout the remainder of the lumbar spine. No retropulsion of the bone fragments that the fractures. Significant kyphosis secondary to the fractures is noted. There is moderate to severe foraminal stenosis at L3-4 right side seems slightly worse than the left. Lumbar x-rays taken in the office (AP/lateral/spot lateral) were reviewed: localized kyphosis from the fracture deformities T12 and L1. There is collapse of the anterior height there is not a vertebral plana. No other additional fractures are seen.  Assessment:    At this point time Duane states her quality-of-life is very poor. The patient fell approximately 6 months ago and has had significant back pain since. She is subsequently been recovering from the intracranial bleed that occurred as a result of the fall. She states that the back pain is now debilitating and would like to move forward with surgery. I have gone over the kyphoplasty with her in great detail and provided her with a pamphlet. We have gone over the risks, benefits, and alternatives to surgery and all of her questions were addressed  Plan:   T12 and L1 kyphoplasty Risks of surgery include: Infection, bleeding, death, stroke, paralysis, nerve damage, leak of cement, need for additional surgery including open  decompression. Ongoing or worse pain. Goals of surgery: Reduction in pain, and improvement in quality of life  Patient states she has spoken with her primary care provider who has signed off and fax over the clearance form, it is not yet in our system, we will check on this.  I have reviewed the patient's medication list. She is not on any blood thinners. Not using any aspirin. Not using any anti-inflammatories. She is artery started to hold her vitamins.  We have also discussed the post-operative recovery period to include: bathing/showering restrictions, wound healing, activity (and driving) restrictions, medications/pain mangement.  We have also discussed post-operative  redflags to include: signs and symptoms of postoperative infection, DVT/PE.  All patients questions were invited and answered  Follow-up: 2 weeks postop

## 2020-11-20 NOTE — Telephone Encounter (Signed)
Form was faxed this morning. Will refax again.

## 2020-11-20 NOTE — H&P (Deleted)
  The note originally documented on this encounter has been moved the the encounter in which it belongs.  

## 2020-11-23 NOTE — Pre-Procedure Instructions (Signed)
FAVOR KREH  11/23/2020    Your procedure is scheduled on Wed. Feb. 16, 2022 from 8:30AM-10:00AM  Report to [facility] Entrance "A" at 6:30AM  Call this number if you have problems the morning of surgery:  [phone]   Remember:  Do not eat and drink after midnight on Feb. 15th   Take these medicines the morning of surgery with A SIP OF WATER: AmLODipine (NORVASC)  Levothyroxine (SYNTHROID)  Rosuvastatin (CRESTOR)  As of today, STOP taking all Aspirin (unless instructed by your doctor) and Other Aspirin containing products, Vitamins, Fish oils, and Herbal medications. Also stop all NSAIDS i.e. Advil, Ibuprofen, Motrin, Aleve, Anaprox, Naproxen, BC, Goody Powders, and all Supplements.   No Smoking of any kind, Tobacco/Vaping, or Alcohol products 24 hours prior to your procedure. If you use a Cpap at night, you may bring all equipment for your overnight stay.   Day of Surgery:  Do not wear jewelry, make-up or nail polish.  Do not wear lotions, powders, or perfumes, or deodorant.  Do not shave 48 hours prior to surgery.    Do not bring valuables to the hospital.  [facility] is not responsible for any belongings or valuables.  Contacts, dentures or bridgework may not be worn into surgery.    For patients admitted to the hospital, discharge time will be determined by your treatment team.  Patients discharged the day of surgery will not be allowed to drive home, and someone age 85 and over needs to stay with them for 24 hours.   Special instructions: [facility]- Preparing For Surgery  Before surgery, you can play an important role. Because skin is not sterile, your skin needs to be as free of germs as possible. You can reduce the number of germs on your skin by washing with CHG (chlorahexidine gluconate) Soap before surgery.  CHG is an antiseptic cleaner which kills germs and bonds with the skin to continue killing germs even after washing.    Oral Hygiene is  also important to reduce your risk of infection.  Remember - BRUSH YOUR TEETH THE MORNING OF SURGERY WITH YOUR REGULAR TOOTHPASTE  Please do not use if you have an allergy to CHG or antibacterial soaps. If your skin becomes reddened/irritated stop using the CHG.  Do not shave (including legs and underarms) for at least 48 hours prior to first CHG shower. It is OK to shave your face.  Please follow these instructions carefully.   1. Shower the NIGHT BEFORE SURGERY and the MORNING OF SURGERY with CHG.   2. If you chose to wash your hair, wash your hair first as usual with your normal shampoo.  3. After you shampoo, rinse your hair and body thoroughly to remove the shampoo.  4. Use CHG as you would any other liquid soap. You can apply CHG directly to the skin and wash gently with a scrungie or a clean washcloth.   5. Apply the CHG Soap to your body ONLY FROM THE NECK DOWN.  Do not use on open wounds or open sores. Avoid contact with your eyes, ears, mouth and genitals (private parts). Wash Face and genitals (private parts)  with your normal soap.  6. Wash thoroughly, paying special attention to the area where your surgery will be performed.  7. Thoroughly rinse your body with warm water from the neck down.  8. DO NOT shower/wash with your normal soap after using and rinsing off the CHG Soap.  9. Pat yourself  dry with a CLEAN TOWEL.  10. Wear CLEAN PAJAMAS to bed the night before surgery, wear comfortable clothes the morning of surgery  11. Place CLEAN SHEETS on your bed the night of your first shower and DO NOT SLEEP WITH PETS.  Reminders: Do not apply any deodorants/lotions.  Please wear clean clothes to the hospital/surgery center.   Remember to brush your teeth WITH YOUR REGULAR TOOTHPASTE.  Please read over the following fact sheets that you were given.

## 2020-11-26 ENCOUNTER — Ambulatory Visit (HOSPITAL_COMMUNITY)
Admission: RE | Admit: 2020-11-26 | Discharge: 2020-11-26 | Disposition: A | Discharge status: Home / Self Care | Payer: Medicare Other | Source: Ambulatory Visit | Attending: Orthopedic Surgery | Admitting: Orthopedic Surgery

## 2020-11-26 ENCOUNTER — Other Ambulatory Visit (HOSPITAL_COMMUNITY)
Admission: RE | Admit: 2020-11-26 | Discharge: 2020-11-26 | Disposition: A | Discharge status: Home / Self Care | Payer: Medicare Other | Source: Ambulatory Visit | Attending: Orthopedic Surgery | Admitting: Orthopedic Surgery

## 2020-11-26 ENCOUNTER — Encounter: Payer: Self-pay | Admitting: Family Medicine

## 2020-11-26 ENCOUNTER — Encounter (HOSPITAL_COMMUNITY): Payer: Self-pay

## 2020-11-26 ENCOUNTER — Other Ambulatory Visit: Payer: Self-pay

## 2020-11-26 ENCOUNTER — Encounter (HOSPITAL_COMMUNITY)
Admission: RE | Admit: 2020-11-26 | Discharge: 2020-11-26 | Disposition: A | Discharge status: Home / Self Care | Payer: Medicare Other | Source: Ambulatory Visit | Attending: Orthopedic Surgery | Admitting: Orthopedic Surgery

## 2020-11-26 DIAGNOSIS — Z8616 Personal history of COVID-19: Secondary | ICD-10-CM | POA: Insufficient documentation

## 2020-11-26 DIAGNOSIS — I10 Essential (primary) hypertension: Secondary | ICD-10-CM | POA: Insufficient documentation

## 2020-11-26 DIAGNOSIS — J9811 Atelectasis: Secondary | ICD-10-CM | POA: Diagnosis not present

## 2020-11-26 DIAGNOSIS — M8088XA Other osteoporosis with current pathological fracture, vertebra(e), initial encounter for fracture: Secondary | ICD-10-CM | POA: Insufficient documentation

## 2020-11-26 DIAGNOSIS — Z79899 Other long term (current) drug therapy: Secondary | ICD-10-CM | POA: Diagnosis not present

## 2020-11-26 DIAGNOSIS — Z7901 Long term (current) use of anticoagulants: Secondary | ICD-10-CM | POA: Diagnosis not present

## 2020-11-26 DIAGNOSIS — Z87891 Personal history of nicotine dependence: Secondary | ICD-10-CM | POA: Diagnosis not present

## 2020-11-26 DIAGNOSIS — Z01812 Encounter for preprocedural laboratory examination: Secondary | ICD-10-CM | POA: Diagnosis not present

## 2020-11-26 DIAGNOSIS — Z20822 Contact with and (suspected) exposure to covid-19: Secondary | ICD-10-CM | POA: Diagnosis not present

## 2020-11-26 DIAGNOSIS — Z01818 Encounter for other preprocedural examination: Secondary | ICD-10-CM

## 2020-11-26 DIAGNOSIS — E039 Hypothyroidism, unspecified: Secondary | ICD-10-CM | POA: Insufficient documentation

## 2020-11-26 HISTORY — DX: Hypothyroidism, unspecified: E03.9

## 2020-11-26 LAB — CBC
HCT: 44.8 % (ref 36.0–46.0)
Hemoglobin: 14 g/dL (ref 12.0–15.0)
MCH: 29.7 pg (ref 26.0–34.0)
MCHC: 31.3 g/dL (ref 30.0–36.0)
MCV: 94.9 fL (ref 80.0–100.0)
Platelets: 284 10*3/uL (ref 150–400)
RBC: 4.72 MIL/uL (ref 3.87–5.11)
RDW: 15.6 % — ABNORMAL HIGH (ref 11.5–15.5)
WBC: 9.5 10*3/uL (ref 4.0–10.5)
nRBC: 0 % (ref 0.0–0.2)

## 2020-11-26 LAB — APTT: aPTT: 33 seconds (ref 24–36)

## 2020-11-26 LAB — PROTIME-INR
INR: 1 (ref 0.8–1.2)
Prothrombin Time: 12.9 seconds (ref 11.4–15.2)

## 2020-11-26 LAB — URINALYSIS, ROUTINE W REFLEX MICROSCOPIC
Bilirubin Urine: NEGATIVE
Glucose, UA: NEGATIVE mg/dL
Hgb urine dipstick: NEGATIVE
Ketones, ur: NEGATIVE mg/dL
Nitrite: NEGATIVE
Protein, ur: NEGATIVE mg/dL
Specific Gravity, Urine: 1.006 (ref 1.005–1.030)
pH: 6 (ref 5.0–8.0)

## 2020-11-26 LAB — BASIC METABOLIC PANEL
Anion gap: 11 (ref 5–15)
BUN: 17 mg/dL (ref 8–23)
CO2: 25 mmol/L (ref 22–32)
Calcium: 9.8 mg/dL (ref 8.9–10.3)
Chloride: 102 mmol/L (ref 98–111)
Creatinine, Ser: 1.11 mg/dL — ABNORMAL HIGH (ref 0.44–1.00)
GFR, Estimated: 48 mL/min — ABNORMAL LOW (ref >60)
Glucose, Bld: 106 mg/dL — ABNORMAL HIGH (ref 70–99)
Potassium: 4 mmol/L (ref 3.5–5.1)
Sodium: 138 mmol/L (ref 135–145)

## 2020-11-26 LAB — SURGICAL PCR SCREEN
MRSA, PCR: NEGATIVE
Staphylococcus aureus: NEGATIVE

## 2020-11-26 LAB — SARS CORONAVIRUS 2 (TAT 6-24 HRS): SARS Coronavirus 2: NEGATIVE

## 2020-11-26 NOTE — Progress Notes (Signed)
Anesthesia Chart Review:  Case: [id number] Date/Time: 11/28/20 0815   Procedure: KYPHOPLASTY T12 and L1 (N/A ) - 90 mins   Anesthesia type: General   Pre-op diagnosis: T12 and L1 osteoporotic compression fracture   Location: [facility] OR ROOM 04 / [place] [facility]   Surgeons: Melina Schools, MD      DISCUSSION: Patient is an 87 year old female scheduled for the above procedure.  History includes former smoker (quit 12/16/86), breast cancer (s/p left breast lumpectomy 08/27/09, s/p radiation), HTN (admission with hypertensive emergency 02/2019), glaucoma, hypothyroidism, cervical cancer (s/p hysterectomy 1972), Barrett's esophagus, dysphagia, osteoporosis, autoimmune disease (not specified but several notes list history of Raynaud's phenomenon), fall vs syncope (04/29/20 with left orbital fracture and ICH/bilateral frontal and right occipital SAH, treated non-surgically), COVID-19 10/22/19.   11/19/20 notation by her PCP Copland, Gay Filler, MD states, "Per cardiology no further w/u needed prior to surgery.  Called pt- she notes no changes in her health, no CP Will clear for her procedure per Dr Rolena Infante". Last cardiology evaluation with Dr. Harrell Gave 09/03/20.   Preoperative CXR showed mild bibasilar subsegmental atelectasis versus infiltrates, old fractures/severe compression deformities involving 2 lower thoracic vertebral bodies. UA with moderate leukocytes, negative nitrites. No fever, cough, SOB, chest pain per PAT RN interview. WBC 9.5K. Message left for [facility] at Dr. Rolena Infante' office regarding CXR and UA results. Defer additional preoperative recommendations, if any, to surgeon unless acute changes.  11/26/20 presurgical COVID-19 test is in process. Anesthesia team to evaluate on the day of surgery.    VS: BP (!) 182/91   Pulse 80   Temp 36.5 C (Oral)   Resp 17   Ht _0  (1.6 m)   Wt 49.9 kg   SpO2 100%   BMI 19.50 kg/m   BP Readings from Last 3 Encounters:  11/26/20 (!) 182/91  09/03/20 (!) 150/80   07/26/20 132/82    PROVIDERS: Copland, Gay Filler, MD is PCP  - Buford Dresser, MD is cardiologist. Last visit 09/03/20. BP overall controlled at that time. Lorenza Burton had been discontinued due to bradycardia after 04/2020 fall/syncope and started on amlodipine for HTN. Back to walking exercises. Aortic sclerosis without AS on 04/2020 echo. Six month follow-up planned.  Narda Amber, DO is neurologist   LABS: Preoperative labs noted. UA with moderate leukocytes, negative nitrites, WBC 11-20, rare bacteria. See DISCUSSION.  (all labs ordered are listed, but only abnormal results are displayed)  Labs Reviewed  CBC - Abnormal; Notable for the following components:      Result Value   RDW 15.6 (*)    All other components within normal limits  BASIC METABOLIC PANEL - Abnormal; Notable for the following components:   Glucose, Bld 106 (*)    Creatinine, Ser 1.11 (*)    GFR, Estimated 48 (*)    All other components within normal limits  URINALYSIS, ROUTINE W REFLEX MICROSCOPIC - Abnormal; Notable for the following components:   Color, Urine STRAW (*)    Leukocytes,Ua MODERATE (*)    Bacteria, UA RARE (*)    Non Squamous Epithelial 0-5 (*)    All other components within normal limits  SURGICAL PCR SCREEN  PROTIME-INR  APTT     IMAGES: CXR 11/26/20: FINDINGS: The heart size and mediastinal contours are within normal limits. No pneumothorax or pleural effusion is noted. Mild bibasilar subsegmental atelectasis or infiltrates are noted. Severe compression deformity is seen involving 2 lower thoracic vertebral bodies consistent with old fractures. IMPRESSION: Mild bibasilar subsegmental  atelectasis or infiltrates are noted.  MRA Head 06/29/20: IMPRESSION: Normal examination. No aneurysm or vascular malformation. No stenosis or occlusion.  Xray T-spine 05/24/20: IMPRESSION: New, probably acute compression deformity of T12 with nearly 50% loss of height  CT Head  05/12/20: IMPRESSION: - Resolution of visible hyperdense subarachnoid blood. No new bleeding. No subdural hematoma. No hydrocephalus or acute stroke. - Atrophy and chronic small-vessel ischemic changes of the white matter.   EKG: 04/29/20: SB at 58 bpm. Borderline right axis deviation.    CV: Long term cardiac monitor 06/25/20-06/28/20: Study Highlights 3 days of data recorded on Zio monitor. Patient had a min HR of 49 bpm, max HR of 197 bpm, and avg HR of 73 bpm. Predominant underlying rhythm was Sinus Rhythm. No VT, atrial fibrillation, high degree block, or pauses noted. Isolated atrial ectopy was frequent (11.2%), and ventricular ectopy was rare (<1%). 57 SVT runs occurred, longest event lasting 10.4 seconds.  There was one triggered event, which was sinus rhythm with ectopy.   Carotid US 05/02/20: Summary:  - Right Carotid: Velocities in the right ICA are consistent with a 1-39%  stenosis.  - Left Carotid: Velocities in the left ICA are consistent with a 1-39%  stenosis.  - Vertebrals: Bilateral vertebral arteries demonstrate antegrade flow.    Echo 04/30/20: IMPRESSIONS  1. Left ventricular ejection fraction, by estimation, is 60 to 65%. The  left ventricle has normal function. The left ventricle has no regional  wall motion abnormalities. Left ventricular diastolic parameters are  indeterminate.  2. Right ventricular systolic function is normal. The right ventricular  size is normal. There is mildly elevated pulmonary artery systolic  pressure.  3. Right atrial size was moderately dilated.  4. The pericardial effusion is circumferential.  5. The mitral valve is normal in structure. Mild mitral valve  regurgitation. No evidence of mitral stenosis.  6. The aortic valve is tricuspid. Aortic valve regurgitation is not  visualized. Mild aortic valve sclerosis is present, with no evidence of  aortic valve stenosis.  7. The inferior vena cava is normal in size with <50%  respiratory  variability, suggesting right atrial pressure of 8 mmHg.  - Comparison(s): No significant change from prior study.  - Conclusion(s)/Recommendation(s): Normal biventricular function without  evidence of hemodynamically significant valvular heart disease.    Past Medical History:  Diagnosis Date  . Autoimmune disease ([place])   . Barrett's esophagus   . Bone lesion    sacrum  . Breast cancer ([place]) 2010   cT1 N0 M0; ER+/ PR+/ HER2 neg; s/p lumpectomy 07/2010; started adjuvant hormonal therapy 10/2009  . Cervical cancer ([place])   . Dysphagia   . Fx distal radius NEC-closed   . Glaucoma   . Humerus fracture 03/2012   impacted left humueral neck fracture, treated by Dr. Amedeo Plenty  . Hyperlipidemia   . Hypertension   . Hypothyroidism   . Osteoporosis 03/18/2012  . Pneumonia   . Thyroid disease     Past Surgical History:  Procedure Laterality Date  . ABDOMINAL HYSTERECTOMY  1972   cancer          1 tube 1 ovary  . APPENDECTOMY    . BREAST LUMPECTOMY Left 2010  . colon polyp removal    . EYE SURGERY    . HAND SURGERY    . ortho procedures     multiple  . OVARIAN CYST REMOVAL  1978  . [place]   left    MEDICATIONS: .  amLODipine (NORVASC) 2.5 MG tablet  . Calcium Carbonate-Vitamin D (CALCIUM + D PO)  . ibuprofen (ADVIL) 200 MG tablet  . latanoprost (XALATAN) 0.005 % ophthalmic solution  . levothyroxine (SYNTHROID) 50 MCG tablet  . Multiple Vitamin (MULTIVITAMIN WITH MINERALS) TABS tablet  . rosuvastatin (CRESTOR) 10 MG tablet   No current facility-administered medications for this encounter.    Myra Gianotti, PA-C Surgical Short Stay/Anesthesiology [facility] Phone [phone] [facility] Phone [phone] 11/26/2020 2:18 PM

## 2020-11-26 NOTE — Progress Notes (Signed)
PCP - Janett Billow Copland Cardiologist - Dr. Buford Dresser Neurologist - Dr. Posey Pronto  Chest x-ray - 11/26/20 EKG - 04/29/20 ECHO - 04/30/20   COVID TEST- 11/26/20   Anesthesia review: yes, heart history, per order  Patient denies shortness of breath, fever, cough and chest pain at PAT appointment   All instructions explained to the patient, with a verbal understanding of the material. Patient agrees to go over the instructions while at home for a better understanding. Patient also instructed to self quarantine after being tested for COVID-19. The opportunity to ask questions was provided.

## 2020-11-26 NOTE — Anesthesia Preprocedure Evaluation (Addendum)
Anesthesia Evaluation  Patient identified by MRN, date of birth, ID band Patient awake    Reviewed: Allergy & Precautions, NPO status , Patient's Chart, lab work & pertinent test results  History of Anesthesia Complications Negative for: history of anesthetic complications  Airway Mallampati: II  TM Distance: >3 FB Neck ROM: Full    Dental  (+) Missing, Poor Dentition, Dental Advisory Given,    Pulmonary former smoker,    Pulmonary exam normal        Cardiovascular hypertension, Pt. on medications Normal cardiovascular exam  Echo 04/30/20: EF 60-65%, mildly elevated PASP, moderate RAE, circumferential pericardial effusion, mild MR     Neuro/Psych fall vs syncope 04/29/20 with left orbital fracture and ICH/bilateral frontal and right occipital SAH, treated non-surgically negative psych ROS   GI/Hepatic negative GI ROS, Neg liver ROS,   Endo/Other  Hypothyroidism   Renal/GU Renal InsufficiencyRenal disease (Cr 1.11)  negative genitourinary   Musculoskeletal negative musculoskeletal ROS (+)   Abdominal   Peds  Hematology negative hematology ROS (+)   Anesthesia Other Findings Day of surgery medications reviewed with patient.  Reproductive/Obstetrics negative OB ROS                          Anesthesia Physical Anesthesia Plan  ASA: II  Anesthesia Plan: MAC   Post-op Pain Management:    Induction:   PONV Risk Score and Plan: 2 and Treatment may vary due to age or medical condition, Ondansetron and Propofol infusion  Airway Management Planned: Natural Airway and Simple Face Mask  Additional Equipment: None  Intra-op Plan:   Post-operative Plan:   Informed Consent: I have reviewed the patients History and Physical, chart, labs and discussed the procedure including the risks, benefits and alternatives for the proposed anesthesia with the patient or authorized representative who has  indicated his/her understanding and acceptance.       Plan Discussed with: CRNA  Anesthesia Plan Comments: (PAT note written 11/26/2020 by Myra Gianotti, PA-C. )      Anesthesia Quick Evaluation

## 2020-11-28 ENCOUNTER — Ambulatory Visit (HOSPITAL_COMMUNITY): Payer: Medicare Other

## 2020-11-28 ENCOUNTER — Ambulatory Visit (HOSPITAL_COMMUNITY)
Admission: RE | Admit: 2020-11-28 | Discharge: 2020-11-28 | Disposition: A | Discharge status: Home / Self Care | Payer: Medicare Other | Attending: Orthopedic Surgery | Admitting: Orthopedic Surgery

## 2020-11-28 ENCOUNTER — Ambulatory Visit (HOSPITAL_COMMUNITY): Payer: Medicare Other | Admitting: Vascular Surgery

## 2020-11-28 ENCOUNTER — Encounter (HOSPITAL_COMMUNITY): Payer: Self-pay | Admitting: Orthopedic Surgery

## 2020-11-28 ENCOUNTER — Encounter (HOSPITAL_COMMUNITY)
Admission: RE | Disposition: A | Discharge status: Home / Self Care | Payer: Self-pay | Source: Home / Self Care | Attending: Orthopedic Surgery

## 2020-11-28 ENCOUNTER — Ambulatory Visit (HOSPITAL_COMMUNITY): Payer: Medicare Other | Admitting: Anesthesiology

## 2020-11-28 DIAGNOSIS — R011 Cardiac murmur, unspecified: Secondary | ICD-10-CM | POA: Diagnosis not present

## 2020-11-28 DIAGNOSIS — Z87891 Personal history of nicotine dependence: Secondary | ICD-10-CM | POA: Insufficient documentation

## 2020-11-28 DIAGNOSIS — N1831 Chronic kidney disease, stage 3a: Secondary | ICD-10-CM | POA: Diagnosis not present

## 2020-11-28 DIAGNOSIS — Z8051 Family history of malignant neoplasm of kidney: Secondary | ICD-10-CM | POA: Diagnosis not present

## 2020-11-28 DIAGNOSIS — Z808 Family history of malignant neoplasm of other organs or systems: Secondary | ICD-10-CM | POA: Insufficient documentation

## 2020-11-28 DIAGNOSIS — M359 Systemic involvement of connective tissue, unspecified: Secondary | ICD-10-CM | POA: Insufficient documentation

## 2020-11-28 DIAGNOSIS — Z803 Family history of malignant neoplasm of breast: Secondary | ICD-10-CM | POA: Diagnosis not present

## 2020-11-28 DIAGNOSIS — Z79899 Other long term (current) drug therapy: Secondary | ICD-10-CM | POA: Diagnosis not present

## 2020-11-28 DIAGNOSIS — S32010A Wedge compression fracture of first lumbar vertebra, initial encounter for closed fracture: Secondary | ICD-10-CM | POA: Diagnosis not present

## 2020-11-28 DIAGNOSIS — Z8249 Family history of ischemic heart disease and other diseases of the circulatory system: Secondary | ICD-10-CM | POA: Insufficient documentation

## 2020-11-28 DIAGNOSIS — E785 Hyperlipidemia, unspecified: Secondary | ICD-10-CM | POA: Diagnosis not present

## 2020-11-28 DIAGNOSIS — Z8541 Personal history of malignant neoplasm of cervix uteri: Secondary | ICD-10-CM | POA: Insufficient documentation

## 2020-11-28 DIAGNOSIS — Z888 Allergy status to other drugs, medicaments and biological substances status: Secondary | ICD-10-CM | POA: Insufficient documentation

## 2020-11-28 DIAGNOSIS — Z853 Personal history of malignant neoplasm of breast: Secondary | ICD-10-CM | POA: Insufficient documentation

## 2020-11-28 DIAGNOSIS — Z419 Encounter for procedure for purposes other than remedying health state, unspecified: Secondary | ICD-10-CM

## 2020-11-28 DIAGNOSIS — Z801 Family history of malignant neoplasm of trachea, bronchus and lung: Secondary | ICD-10-CM | POA: Diagnosis not present

## 2020-11-28 DIAGNOSIS — Z981 Arthrodesis status: Secondary | ICD-10-CM | POA: Diagnosis not present

## 2020-11-28 DIAGNOSIS — Z4889 Encounter for other specified surgical aftercare: Secondary | ICD-10-CM | POA: Diagnosis not present

## 2020-11-28 DIAGNOSIS — I129 Hypertensive chronic kidney disease with stage 1 through stage 4 chronic kidney disease, or unspecified chronic kidney disease: Secondary | ICD-10-CM | POA: Diagnosis not present

## 2020-11-28 DIAGNOSIS — D62 Acute posthemorrhagic anemia: Secondary | ICD-10-CM | POA: Diagnosis not present

## 2020-11-28 DIAGNOSIS — M8008XA Age-related osteoporosis with current pathological fracture, vertebra(e), initial encounter for fracture: Secondary | ICD-10-CM | POA: Diagnosis not present

## 2020-11-28 DIAGNOSIS — S22080A Wedge compression fracture of T11-T12 vertebra, initial encounter for closed fracture: Secondary | ICD-10-CM | POA: Diagnosis not present

## 2020-11-28 HISTORY — PX: BACK SURGERY: SHX140

## 2020-11-28 HISTORY — PX: KYPHOPLASTY: SHX5884

## 2020-11-28 SURGERY — KYPHOPLASTY
Anesthesia: General | Site: Back

## 2020-11-28 MED ORDER — FENTANYL CITRATE (PF) 100 MCG/2ML IJ SOLN
INTRAMUSCULAR | Status: DC | PRN
  Administered 2020-11-28: 25 ug via INTRAVENOUS

## 2020-11-28 MED ORDER — BUPIVACAINE-EPINEPHRINE 0.25% -1:200000 IJ SOLN
INTRAMUSCULAR | Status: DC | PRN
  Administered 2020-11-28: 10 mL
  Administered 2020-11-28: 20 mL

## 2020-11-28 MED ORDER — 0.9 % SODIUM CHLORIDE (POUR BTL) OPTIME
TOPICAL | Status: DC | PRN
  Administered 2020-11-28: 1000 mL

## 2020-11-28 MED ORDER — PHENYLEPHRINE 40 MCG/ML (10ML) SYRINGE FOR IV PUSH (FOR BLOOD PRESSURE SUPPORT)
PREFILLED_SYRINGE | INTRAVENOUS | Status: DC | PRN
  Administered 2020-11-28 (×4): 80 ug via INTRAVENOUS

## 2020-11-28 MED ORDER — CHLORHEXIDINE GLUCONATE 0.12 % MT SOLN
15.0000 mL | Freq: Once | OROMUCOSAL | Status: AC
  Administered 2020-11-28: 15 mL via OROMUCOSAL
  Filled 2020-11-28: qty 15

## 2020-11-28 MED ORDER — DEXAMETHASONE SODIUM PHOSPHATE 10 MG/ML IJ SOLN
INTRAMUSCULAR | Status: AC
  Filled 2020-11-28: qty 1

## 2020-11-28 MED ORDER — IOHEXOL 300 MG/ML  SOLN
INTRAMUSCULAR | Status: DC | PRN
Start: 2020-11-28 — End: 2020-11-28
  Administered 2020-11-28: 100 mL

## 2020-11-28 MED ORDER — PHENYLEPHRINE 40 MCG/ML (10ML) SYRINGE FOR IV PUSH (FOR BLOOD PRESSURE SUPPORT)
PREFILLED_SYRINGE | INTRAVENOUS | Status: AC
  Filled 2020-11-28: qty 10

## 2020-11-28 MED ORDER — ONDANSETRON HCL 4 MG/2ML IJ SOLN
INTRAMUSCULAR | Status: AC
  Filled 2020-11-28: qty 2

## 2020-11-28 MED ORDER — LIDOCAINE 2% (20 MG/ML) 5 ML SYRINGE
INTRAMUSCULAR | Status: AC
  Filled 2020-11-28: qty 5

## 2020-11-28 MED ORDER — PROPOFOL 10 MG/ML IV BOLUS
INTRAVENOUS | Status: AC
  Filled 2020-11-28: qty 20

## 2020-11-28 MED ORDER — LACTATED RINGERS IV SOLN: INTRAVENOUS | Status: DC

## 2020-11-28 MED ORDER — CEFAZOLIN SODIUM-DEXTROSE 2-4 GM/100ML-% IV SOLN
2.0000 g | INTRAVENOUS | Status: AC
  Administered 2020-11-28: 2 g via INTRAVENOUS
  Filled 2020-11-28: qty 100

## 2020-11-28 MED ORDER — FENTANYL CITRATE (PF) 250 MCG/5ML IJ SOLN
INTRAMUSCULAR | Status: AC
  Filled 2020-11-28: qty 5

## 2020-11-28 MED ORDER — EPINEPHRINE PF 1 MG/ML IJ SOLN
INTRAMUSCULAR | Status: AC
  Filled 2020-11-28: qty 1

## 2020-11-28 MED ORDER — ACETAMINOPHEN 500 MG PO TABS
1000.0000 mg | ORAL_TABLET | Freq: Once | ORAL | Status: AC
  Administered 2020-11-28: 1000 mg via ORAL
  Filled 2020-11-28: qty 2

## 2020-11-28 MED ORDER — BUPIVACAINE LIPOSOME 1.3 % IJ SUSP
20.0000 mL | INTRAMUSCULAR | Status: AC
  Administered 2020-11-28: 20 mL
  Filled 2020-11-28: qty 20

## 2020-11-28 MED ORDER — ORAL CARE MOUTH RINSE: 15.0000 mL | Freq: Once | OROMUCOSAL | Status: AC

## 2020-11-28 MED ORDER — ONDANSETRON HCL 4 MG/2ML IJ SOLN
INTRAMUSCULAR | Status: DC | PRN
  Administered 2020-11-28: 4 mg via INTRAVENOUS

## 2020-11-28 MED ORDER — BUPIVACAINE HCL (PF) 0.25 % IJ SOLN
INTRAMUSCULAR | Status: AC
  Filled 2020-11-28: qty 30

## 2020-11-28 MED ORDER — LACTATED RINGERS IV SOLN: INTRAVENOUS | Status: DC | PRN

## 2020-11-28 MED ORDER — TRAMADOL HCL 50 MG PO TABS: 50.0000 mg | ORAL_TABLET | Freq: Three times a day (TID) | ORAL | Status: AC | PRN

## 2020-11-28 MED ORDER — LIDOCAINE 2% (20 MG/ML) 5 ML SYRINGE
INTRAMUSCULAR | Status: DC | PRN
  Administered 2020-11-28: 20 mg via INTRAVENOUS

## 2020-11-28 MED ORDER — PROPOFOL 500 MG/50ML IV EMUL
INTRAVENOUS | Status: DC | PRN
  Administered 2020-11-28: 75 ug/kg/min via INTRAVENOUS

## 2020-11-28 MED ORDER — DEXAMETHASONE SODIUM PHOSPHATE 4 MG/ML IJ SOLN
INTRAMUSCULAR | Status: DC | PRN
  Administered 2020-11-28: 4 mg via INTRAVENOUS

## 2020-11-28 SURGICAL SUPPLY — 42 items
BLADE SURG 15 STRL LF DISP TIS (BLADE) ×1 IMPLANT
BLADE SURG 15 STRL SS (BLADE) ×2
BNDG ADH 1X3 SHEER STRL LF (GAUZE/BANDAGES/DRESSINGS) ×4 IMPLANT
CEMENT KYPHON CX01A KIT/MIXER (Cement) ×2 IMPLANT
COVER MAYO STAND STRL (DRAPES) ×2 IMPLANT
COVER SURGICAL LIGHT HANDLE (MISCELLANEOUS) ×2 IMPLANT
CURETTE EXPRESS SZ2 7MM (INSTRUMENTS) ×1 IMPLANT
CURETTE WEDGE 8.5MM KYPHX (MISCELLANEOUS) IMPLANT
CURRETTE EXPRESS SZ2 7MM (INSTRUMENTS) ×2
DERMABOND ADHESIVE PROPEN (GAUZE/BANDAGES/DRESSINGS) ×1
DERMABOND ADVANCED (GAUZE/BANDAGES/DRESSINGS) ×1
DERMABOND ADVANCED .7 DNX12 (GAUZE/BANDAGES/DRESSINGS) ×1 IMPLANT
DERMABOND ADVANCED .7 DNX6 (GAUZE/BANDAGES/DRESSINGS) ×1 IMPLANT
DRAPE C-ARM 42X72 X-RAY (DRAPES) ×4 IMPLANT
DRAPE INCISE IOBAN 66X45 STRL (DRAPES) ×2 IMPLANT
DRAPE LAPAROTOMY T 102X78X121 (DRAPES) ×2 IMPLANT
DRAPE WARM FLUID 44X44 (DRAPES) ×2 IMPLANT
DURAPREP 26ML APPLICATOR (WOUND CARE) ×2 IMPLANT
GLOVE BIO SURGEON STRL SZ 6.5 (GLOVE) ×2 IMPLANT
GLOVE BIOGEL PI IND STRL 8.5 (GLOVE) ×1 IMPLANT
GLOVE BIOGEL PI INDICATOR 8.5 (GLOVE) ×1
GLOVE SS BIOGEL STRL SZ 8.5 (GLOVE) ×1 IMPLANT
GLOVE SUPERSENSE BIOGEL SZ 8.5 (GLOVE) ×1
GLOVE SURG UNDER POLY LF SZ6.5 (GLOVE) ×2 IMPLANT
GOWN STRL REUS W/ TWL LRG LVL3 (GOWN DISPOSABLE) ×2 IMPLANT
GOWN STRL REUS W/TWL 2XL LVL3 (GOWN DISPOSABLE) ×2 IMPLANT
GOWN STRL REUS W/TWL LRG LVL3 (GOWN DISPOSABLE) ×4
KIT BASIN OR (CUSTOM PROCEDURE TRAY) ×2 IMPLANT
KIT TURNOVER KIT B (KITS) ×2 IMPLANT
NEEDLE HYPO 22GX1.5 SAFETY (NEEDLE) ×2 IMPLANT
NEEDLE SPNL 22GX3.5 QUINCKE BK (NEEDLE) ×2 IMPLANT
NS IRRIG 1000ML POUR BTL (IV SOLUTION) ×2 IMPLANT
PACK SURGICAL SETUP 50X90 (CUSTOM PROCEDURE TRAY) ×2 IMPLANT
PAD ARMBOARD 7.5X6 YLW CONV (MISCELLANEOUS) ×4 IMPLANT
SPONGE LAP 4X18 RFD (DISPOSABLE) ×2 IMPLANT
SUT MNCRL AB 3-0 PS2 18 (SUTURE) IMPLANT
SUT MON AB 3-0 SH 27 (SUTURE) ×2
SUT MON AB 3-0 SH27 (SUTURE) ×1 IMPLANT
SYR CONTROL 10ML LL (SYRINGE) ×2 IMPLANT
TOWEL GREEN STERILE (TOWEL DISPOSABLE) ×2 IMPLANT
TRAY KYPHOPAK 15/3 ONESTEP 1ST (MISCELLANEOUS) ×4 IMPLANT
WATER STERILE IRR 1000ML POUR (IV SOLUTION) ×2 IMPLANT

## 2020-11-28 NOTE — Transfer of Care (Signed)
Immediate Anesthesia Transfer of Care Note  Patient: [name]  Procedure(s) Performed: KYPHOPLASTY THORACIC TWELVE AND LUMBAR ONE (N/A Back)  Patient Location: PACU  Anesthesia Type:MAC  Level of Consciousness: awake, alert , oriented and patient cooperative  Airway & Oxygen Therapy: Patient Spontanous Breathing  Post-op Assessment: Report given to RN and Post -op Vital signs reviewed and stable  Post vital signs: Reviewed  Last Vitals:  Vitals Value Taken Time  BP 121/103 11/28/20 0948  Temp    Pulse 74 11/28/20 0948  Resp 13 11/28/20 0948  SpO2 97 % 11/28/20 0948  Vitals shown include unvalidated device data.  Last Pain:  Vitals:   11/28/20 0726  TempSrc:   PainSc: 0-No pain         Complications: No complications documented.

## 2020-11-28 NOTE — Anesthesia Procedure Notes (Signed)
Procedure Name: MAC Date/Time: 11/28/2020 8:40 AM Performed by: Jenne Campus, CRNA Pre-anesthesia Checklist: Patient identified, Emergency Drugs available, Suction available and Patient being monitored Oxygen Delivery Method: Simple face mask

## 2020-11-28 NOTE — Op Note (Signed)
Operative report  Preoperative diagnosis: T12 and L1 osteoporotic compression fracture  Postoperative diagnosis: Same  Operative procedure: T12 and L1 kyphoplasty  Complications: None  EBL: None  Condition: Stable  Indications this is a very pleasant 87 year old woman who has significant thoracolumbar pain.  Imaging studies confirmed osteoporotic compression fractures of T12 and L1.  As result of the failure to improve with conservative management we elected to move forward with surgery.  All appropriate risks, benefits, alternatives were discussed with the patient and consent was obtained.  Operative report: Patient was brought the operating room and turned prone onto the operating room table.  All bony prominences were well-padded and the thoracolumbar spine was prepped and draped in standard fashion.  timeout was taken to confirm patient, procedure, and all other important data.  After successful induction of IV sedation, fluoroscopic imaging was brought into the field in order to identify the T12 and L1 fractures.  Once identified appropriate level in the AP and lateral planes I marked out the lateral border of the pedicle and infiltrated the skin in this area with quarter percent Marcaine with epinephrine. I then used a final needle in order to place Exparel and Marcaine at the outer aspect of the facet joint and facet capsule.  Once I had adequate IV sedation and local anesthesia I proceeded with the kyphoplasty.  A small incision was made and the Jamshidi needle was advanced down to the lateral aspect of the L1 pedicle I then advanced into the pedicle taking a slight extrapedicular approach to the vertebral body.  As I neared the medial wall of the pedicle on the AP view I confirmed that I was beyond the posterior wall of the vertebral body in the lateral view.  Once I confirmed satisfactory trajectory and position I repeated the exact same procedure on the contralateral side I thought had  bilateral L1 vertebral body cannulation.  At T12 there was the appearance of advanced collapse and near vertebral plana on the fluoroscopy view.  Inspection of the MRI revealed that there was asymmetrical collapse of vertebral body with the right side of the T12 vertebral body more collapsed than the left.  There was a central collapse that was slightly more prominent going to the right.  As result I elected to do a unilateral cement insertion on the side.  I felt as though I have better overall results with placing on the left side only.  Also failed the less chance of breach in leak of cement.  I sensitized the area with quarter percent Marcaine with epinephrine and anesthetize the deep area with Exparel.  I then made a small incision and advanced the Jamshidi needle down to the lateral aspect of the pedicle.  I took a true extrapedicular approach so that I advanced into the pedicle just before advancing into the vertebral body.  This allowed me to take a trajectory that would position me slightly more medial so that I had a very opportunity to medialized the inflatable bone tamp.  Once both pedicles were properly cannulated I inserted the inflatable bone tamps until I had excellent fill in the vertebral bodies.  I then deflated the balloons and inserted the cement.  Approximately 2 cc of cement was placed into the T12 vertebral body, and 5 cc in the L1 vertebral body.  The cement was then allowed to cure and harden and the Jamshidi needles were removed.  At this point I took final x-rays which demonstrated satisfactory position of the cement  mantle within the vertebral body with no leak.  The skin was cleaned and all 3 stab incisions were closed with interrupted 3-0 Monocryl stitches.  Dermabond and Band-Aids were applied and the patient was extubated transfer the PACU without incident.  At the end of the case all needle sponge counts were correct.  There were no adverse intraoperative events.

## 2020-11-28 NOTE — Brief Op Note (Signed)
11/28/2020  9:53 AM  PATIENT:  [name]  87 y.o. female  PRE-OPERATIVE DIAGNOSIS:  T12 and L1 osteoporotic compression fracture  POST-OPERATIVE DIAGNOSIS:  T12 and L1 osteoporotic compression fracture  PROCEDURE:  Procedure(s) with comments: KYPHOPLASTY THORACIC TWELVE AND LUMBAR ONE (N/A) - 90 mins  SURGEON:  Surgeon(s) and Role:    Melina Schools, MD - Primary  PHYSICIAN ASSISTANT:   ASSISTANTS: none   ANESTHESIA:   local and IV sedation  EBL:  3 mL   BLOOD ADMINISTERED:none  DRAINS: none   LOCAL MEDICATIONS USED:  MARCAINE    and OTHER experal  SPECIMEN:  No Specimen  DISPOSITION OF SPECIMEN:  N/A  COUNTS:  YES  TOURNIQUET:  * No tourniquets in log *  DICTATION: .Dragon Dictation  PLAN OF CARE: Discharge to home after PACU  PATIENT DISPOSITION:  PACU - hemodynamically stable.

## 2020-11-28 NOTE — Anesthesia Postprocedure Evaluation (Signed)
Anesthesia Post Note  Patient: [name]  Procedure(s) Performed: KYPHOPLASTY THORACIC TWELVE AND LUMBAR ONE (N/A Back)     Patient location during evaluation: PACU Anesthesia Type: General Level of consciousness: awake and alert and oriented Pain management: pain level controlled Vital Signs Assessment: post-procedure vital signs reviewed and stable Respiratory status: spontaneous breathing, nonlabored ventilation and respiratory function stable Cardiovascular status: blood pressure returned to baseline Postop Assessment: no apparent nausea or vomiting Anesthetic complications: no   No complications documented.  Last Vitals:  Vitals:   11/28/20 0949 11/28/20 1002  BP: (!) 121/103 120/79  Pulse: 72 82  Resp: (!) 23 17  Temp: 36.7 C 36.7 C  SpO2: 93% 100%    Last Pain:  Vitals:   11/28/20 1002  TempSrc:   PainSc: 0-No pain                 Brennan Bailey

## 2020-11-28 NOTE — Discharge Instructions (Signed)
For mild pain ok to use tylenol. Tramadol is only for severe/significant pain.

## 2020-11-28 NOTE — H&P (Signed)
Addendum H&P.  There has been no change in the patient's clinical exam since her last office visit of 11/20/2020.  She continues to have pain at the thoracolumbar junction secondary to her T12 and L1 compression fracture.  We will plan on moving forward with a kyphoplasty of T12 and L1 as discussed I have again reviewed the risks, benefits, alternatives of surgery and all of her questions were addressed.  She has expressed a willingness and desire to move forward with surgery.

## 2020-11-29 ENCOUNTER — Encounter (HOSPITAL_COMMUNITY): Payer: Self-pay | Admitting: Orthopedic Surgery

## 2020-11-29 MED ORDER — FLUCONAZOLE 150 MG PO TABS: 150.0000 mg | ORAL_TABLET | Freq: Once | ORAL | 0 refills | Status: AC

## 2020-11-29 MED ORDER — SULFAMETHOXAZOLE-TRIMETHOPRIM 800-160 MG PO TABS: 1.0000 | ORAL_TABLET | Freq: Two times a day (BID) | ORAL | 0 refills | Status: DC

## 2020-11-29 NOTE — Addendum Note (Signed)
Addended by: Lamar Blinks C on: 11/29/2020 09:06 AM   Modules accepted: Orders

## 2020-11-29 NOTE — Addendum Note (Signed)
Addended by: Lamar Blinks C on: 11/29/2020 08:24 AM   Modules accepted: Orders

## 2020-12-03 ENCOUNTER — Other Ambulatory Visit: Payer: Self-pay

## 2020-12-03 ENCOUNTER — Encounter (HOSPITAL_BASED_OUTPATIENT_CLINIC_OR_DEPARTMENT_OTHER): Payer: Self-pay | Admitting: Emergency Medicine

## 2020-12-03 ENCOUNTER — Emergency Department (HOSPITAL_BASED_OUTPATIENT_CLINIC_OR_DEPARTMENT_OTHER)
Admission: EM | Admit: 2020-12-03 | Discharge: 2020-12-03 | Disposition: A | Discharge status: Home / Self Care | Payer: Medicare Other | Attending: Emergency Medicine | Admitting: Emergency Medicine

## 2020-12-03 DIAGNOSIS — S61402A Unspecified open wound of left hand, initial encounter: Secondary | ICD-10-CM | POA: Insufficient documentation

## 2020-12-03 DIAGNOSIS — E039 Hypothyroidism, unspecified: Secondary | ICD-10-CM | POA: Diagnosis not present

## 2020-12-03 DIAGNOSIS — Z79899 Other long term (current) drug therapy: Secondary | ICD-10-CM | POA: Diagnosis not present

## 2020-12-03 DIAGNOSIS — X58XXXA Exposure to other specified factors, initial encounter: Secondary | ICD-10-CM | POA: Diagnosis not present

## 2020-12-03 DIAGNOSIS — Z8544 Personal history of malignant neoplasm of other female genital organs: Secondary | ICD-10-CM | POA: Diagnosis not present

## 2020-12-03 DIAGNOSIS — Z87891 Personal history of nicotine dependence: Secondary | ICD-10-CM | POA: Insufficient documentation

## 2020-12-03 DIAGNOSIS — Z853 Personal history of malignant neoplasm of breast: Secondary | ICD-10-CM | POA: Insufficient documentation

## 2020-12-03 DIAGNOSIS — S6992XA Unspecified injury of left wrist, hand and finger(s), initial encounter: Secondary | ICD-10-CM | POA: Diagnosis present

## 2020-12-03 DIAGNOSIS — N183 Chronic kidney disease, stage 3 unspecified: Secondary | ICD-10-CM | POA: Insufficient documentation

## 2020-12-03 DIAGNOSIS — I129 Hypertensive chronic kidney disease with stage 1 through stage 4 chronic kidney disease, or unspecified chronic kidney disease: Secondary | ICD-10-CM | POA: Insufficient documentation

## 2020-12-03 MED ORDER — BACITRACIN ZINC 500 UNIT/GM EX OINT
TOPICAL_OINTMENT | CUTANEOUS | Status: AC
  Filled 2020-12-03: qty 28.35

## 2020-12-03 MED ORDER — BACITRACIN ZINC 500 UNIT/GM EX OINT: 1.0000 "application " | TOPICAL_OINTMENT | Freq: Every day | CUTANEOUS | 0 refills | Status: AC

## 2020-12-03 MED ORDER — "CURITY TELFA ADHESIVE 2""X3"" PADS": 1.0000 [IU] | MEDICATED_PAD | Freq: Every day | 1 refills | Status: AC

## 2020-12-03 MED ORDER — BACITRACIN ZINC 500 UNIT/GM EX OINT: TOPICAL_OINTMENT | Freq: Two times a day (BID) | CUTANEOUS | Status: DC

## 2020-12-03 NOTE — Discharge Instructions (Signed)
Please follow up with your primary care doctor within in the next week to recheck the skin on your hand.   Please leave the dressing on that was placed until tomorrow at noon.  He will do daily dressing changes at noon each day. Please return to the ER for any of the symptoms that we discussed including fevers, chills, redness swelling or pus draining from wound or any other new or concerning symptoms.  I prescribed you dressings to use as well as some bacitracin which is a topical antibiotic which she will placed on the wound and the thin layer when you are changing the wound dressing.

## 2020-12-03 NOTE — ED Provider Notes (Signed)
[facility] Provider Note   CSN: [id number] Arrival date & time: 12/03/20  3716     History Chief Complaint  Patient presents with  . Wound Check    [name] is a 87 y.o. female.  HPI Patient is an 87 year old female with past medical history significant for HTN, HLD, hypothyroidism, breast cancer blood,  Patient is presented today with skin avulsion to left hand.  She states that she had a kyphoplasty last week on Wednesday and when she had the IV removed she states that the dressing tore some of the skin of her hand.  She states that she has been putting some Vaseline on the area and keeping it dressed however she states when she takes the dressing off it tends to pull off more tissue.  She states that the area has been feeling very tight especially when she makes a fist.  She states because of the burning pain that she has been shortness of that she came to the ER for evaluation.  She denies any fevers, redness, purulent drainage, fatigue, malaise.  She states she is otherwise feeling well.  She states that she has been washing the area with hydrogen peroxide recently.  She has tried no other treatments.  No other associated symptoms.    Past Medical History:  Diagnosis Date  . Autoimmune disease ([place])   . Barrett's esophagus   . Bone lesion    sacrum  . Breast cancer ([place]) 2010   cT1 N0 M0; ER+/ PR+/ HER2 neg; s/p lumpectomy 07/2010; started adjuvant hormonal therapy 10/2009  . Cervical cancer ([place])   . Dysphagia   . Fx distal radius NEC-closed   . Glaucoma   . Humerus fracture 03/2012   impacted left humueral neck fracture, treated by Dr. Amedeo Plenty  . Hyperlipidemia   . Hypertension   . Hypothyroidism   . Osteoporosis 03/18/2012  . Pneumonia   . Thyroid disease     Patient Active Problem List   Diagnosis Date Noted  . Intracranial hemorrhage ([place])   . Acute blood loss anemia   . TBI (traumatic brain injury) ([place]) 05/04/2020  .  Back pain 05/04/2020  . SAH (subarachnoid hemorrhage) ([place]) 04/29/2020  . Fall at home, initial encounter 04/29/2020  . Orbital fracture, closed, initial encounter ([place]) 04/29/2020  . Basal cell carcinoma (BCC) in situ of skin 04/04/2020  . Essential hypertension 03/21/2019  . Stage 3a chronic kidney disease   . Systolic murmur 96/78/9381  . Hypercalcemia   . AKI (acute kidney injury) ([place])   . Dizziness 02/22/2019  . Squamous cell carcinoma in situ of skin 11/30/2018  . Diarrhea 09/08/2017  . Nausea vomiting and diarrhea 09/08/2017  . Elevated serum creatinine 09/08/2017  . PVC (premature ventricular contraction) 09/08/2017  . Glaucoma 09/08/2017  . Hx of meningioma of the brain 08/28/2017  . Hoarseness of voice 02/28/2016  . Raynaud's phenomenon 02/28/2016  . Esophageal spasm 09/20/2013  . Osteoporosis 03/18/2012  . Fracture, shoulder 03/06/2012  . Hyperlipidemia   . Breast cancer [facility])     Past Surgical History:  Procedure Laterality Date  . ABDOMINAL HYSTERECTOMY  1972   cancer          1 tube 1 ovary  . APPENDECTOMY    . BREAST LUMPECTOMY Left 2010  . colon polyp removal    . EYE SURGERY    . HAND SURGERY    . KYPHOPLASTY N/A 11/28/2020   Procedure: KYPHOPLASTY THORACIC TWELVE AND LUMBAR  ONE;  Surgeon: Melina Schools, MD;  Location: [facility];  Service: Orthopedics;  Laterality: N/A;  90 mins  . ortho procedures     multiple  . OVARIAN CYST REMOVAL  1978  . [place]   left     OB History   No obstetric history on file.     Family History  Problem Relation Age of Onset  . Heart disease Father   . Cancer Sister        lung, kidney  . Cancer Brother        brain  . Cancer Paternal Aunt        breast  . Breast cancer Paternal Aunt   . Cancer Brother        liver, lung  . Osteoporosis Neg Hx     Social History   Tobacco Use  . Smoking status: Former Smoker    Quit date: 12/16/1986    Years since quitting: 33.9  . Smokeless tobacco: Never  Used  . Tobacco comment: FORMER SMOKER 30 YEARS AGO  Vaping Use  . Vaping Use: Never used  Substance Use Topics  . Alcohol use: Yes    Alcohol/week: 0.0 standard drinks    Comment: OCCASIONALLY  . Drug use: No    Home Medications Prior to Admission medications   Medication Sig Start Date End Date Taking? Authorizing Provider  bacitracin ointment Apply 1 application topically daily for 7 days. 12/03/20 12/10/20 Yes Sakoya Win S, PA  Gauze Pads & Dressings (CURITY TELFA ADHESIVE) 2"X3" PADS 1 Units by Does not apply route daily for 7 days. 12/03/20 12/10/20 Yes Daric Koren S, PA  amLODipine (NORVASC) 2.5 MG tablet Take 2.5 mg by mouth daily.    [provider]  Calcium Carbonate-Vitamin D (CALCIUM + D PO) Take 1 capsule by mouth daily.     [provider]  latanoprost (XALATAN) 0.005 % ophthalmic solution Place 1 drop into both eyes at bedtime.     [provider]  levothyroxine (SYNTHROID) 50 MCG tablet TAKE 1 TABLET BY MOUTH DAILY BEFORE BREAKFAST Patient taking differently: Take 50 mcg by mouth daily before breakfast. 09/17/20   Copland, Gay Filler, MD  Multiple Vitamin (MULTIVITAMIN WITH MINERALS) TABS tablet Take 1 tablet by mouth daily. One a day    [provider]  rosuvastatin (CRESTOR) 10 MG tablet Take 1 tablet (10 mg total) by mouth daily. TAKE 1 BY MOUTH DAILY Patient taking differently: Take 10 mg by mouth daily. 09/11/20   Copland, Gay Filler, MD  sulfamethoxazole-trimethoprim (BACTRIM DS) 800-160 MG tablet Take 1 tablet by mouth 2 (two) times daily. 11/29/20   Copland, Gay Filler, MD    Allergies    Daypro [oxaprozin] and Prolia [denosumab]  Review of Systems   Review of Systems  Constitutional: Negative for chills and fever.  HENT: Negative for congestion.   Eyes: Negative for pain.  Respiratory: Negative for cough and shortness of breath.   Cardiovascular: Negative for chest pain and leg swelling.  Gastrointestinal: Negative for  abdominal pain and vomiting.  Genitourinary: Negative for dysuria.  Musculoskeletal: Negative for myalgias.  Skin: Positive for wound. Negative for rash.  Neurological: Negative for dizziness and headaches.    Physical Exam Updated Vital Signs BP 138/81   Pulse 72   Temp 98.2 F (36.8 C) (Oral)   Resp 16   Ht _0  (1.6 m)   Wt 49 kg   SpO2 100%   BMI 19.13 kg/m  Physical Exam Vitals and nursing note reviewed.  Constitutional:      General: She is not in acute distress.    Appearance: Normal appearance. She is not ill-appearing.  HENT:     Head: Normocephalic and atraumatic.     Mouth/Throat:     Mouth: Mucous membranes are moist.  Eyes:     General: No scleral icterus.       Right eye: No discharge.        Left eye: No discharge.     Conjunctiva/sclera: Conjunctivae normal.  Pulmonary:     Effort: Pulmonary effort is normal.     Breath sounds: No stridor.  Musculoskeletal:     Comments: No TTP of the hand or fingers  Skin:    General: Skin is warm and dry.     Capillary Refill: Capillary refill takes less than 2 seconds.     Comments: 3 cm x 5 cm area of skin avulsion on the left dorsum of the hand Wound edges are pink.  There is no warmth to touch.  There is no purulent drainage.  There is no evidence of drainage.  No tenderness to palpation. Wound base is clean.  No streaking / lymphangitis.  Neurological:     Mental Status: She is alert and oriented to person, place, and time. Mental status is at baseline.       ED Results / Procedures / Treatments   Labs (all labs ordered are listed, but only abnormal results are displayed) Labs Reviewed - No data to display  EKG None  Radiology No results found.  Procedures Procedures   Medications Ordered in ED Medications - No data to display  ED Course  I have reviewed the triage vital signs and the nursing notes.  Pertinent labs & imaging results that were available during my care of the patient were  reviewed by me and considered in my medical decision making (see chart for details).    MDM Rules/Calculators/A&P                          Patient is an 87 year old female with past medical history detailed in HPI presented today for left hand skin avulsion.  The wound is well-appearing.  There is no evidence of infection.  She is full range of motion of her hand.  She was counseled on discontinuing her hydroperoxide rinses.  She was given dressing by nurse of Telfa and bacitracin.  Discharged home with wound care instructions, follow-up with her PCP within this week and return precautions.  She was also given Telfa pads and bacitracin ointment.  She will do daily wound checks and dressing changes.  Patient is understanding of plan.  Her and her husband are agreeable to discharge at this time with close follow-up.  I discussed case with my attending physician prior to her discharge.  All questions answered to best of my ability.  Final Clinical Impression(s) / ED Diagnoses Final diagnoses:  Avulsion of skin of left hand, initial encounter    Rx / DC Orders ED Discharge Orders         Ordered    Gauze Pads & Dressings (CURITY TELFA ADHESIVE) 2"X3" PADS  Daily        12/03/20 1045    bacitracin ointment  Daily        12/03/20 1045           Pati Gallo [place], [place] 12/03/20 1105    [place],  [place], DO 12/03/20 1439

## 2020-12-03 NOTE — ED Notes (Signed)
ED Provider at bedside. 

## 2020-12-03 NOTE — ED Triage Notes (Signed)
Pt has wound to left hand, post op Wednesday, had IV to that hand.  Pt states skin was removed when they took off the IV dressing.

## 2020-12-04 NOTE — Progress Notes (Signed)
Nurse Tech removed IV and pt acquired a skin tear to hand when removing the tape.  Cleaned area and applied a clean, dry dressing.  Discussed with pt how to care for site.  Pt verbalized understanding.  Also called daughter and notified ,  discussed wound care on the phone.

## 2020-12-09 NOTE — Patient Instructions (Addendum)
It was good to see you again today, please keep me posted about how your hand is feeling  Please consider shingles vaccine at your pharmacy  continue to bandage your hand until it has healed over or developed a good scab  Let me know if you need a referral to PT here at the [facility] I will be in touch with your thyroid level   Schedule wellness check with Jana Half at your convenience Please see me in 4-6 months

## 2020-12-09 NOTE — Progress Notes (Addendum)
[facility] at [facility], [place], [place] [address] [phone]  Date:  12/12/2020   Name:  [name]   DOB:  1931-01-29   MRN:  [id number]  PCP:  Darreld Mclean, MD    Chief Complaint: Hand Injury (Abrasion from iv tape)   History of Present Illness:  [name] is a 87 y.o. very pleasant female patient who presents with the following:  Patient here today for a follow-up visit Seen by myself most recently in October of 2021- HTN, HLD, hypothyroidism, breast cancer  She typically has enjoyed an extensive walking program, which has been curtailed recently due to injuries Daleysa fell in August 2021, she sustained an orbital fracture and subarachnoid hemorrhage, as well as a thoracic compression fracture  She underwent surgery in February for kyphoplasty T12 and L1 per Dr. Rolena Infante She is to start PT for her back here at the [facility] She is still having some pain in her back, though she does feel that she is overall better.  She is eager to return to her walking program  She was seen in the ER on February 21-when the IV from surgery was removed from her left hand the skin tore, she went to the ER for continued problems with this area  She was instructed about proper dressing of the wound and asked to use bacitracin There is a photo of the wound in her ER chart for reference She is using antibiotic cream BID She is using a non- stick bandage  This area does seem to be healing, albeit slowly.  She is not having any particular pain as far as her hand Can update TSH today Patient Active Problem List   Diagnosis Date Noted  . Intracranial hemorrhage ([place])   . Acute blood loss anemia   . TBI (traumatic brain injury) ([place]) 05/04/2020  . Back pain 05/04/2020  . SAH (subarachnoid hemorrhage) ([place]) 04/29/2020  . Fall at home, initial encounter 04/29/2020  . Orbital fracture, closed, initial encounter ([place])  04/29/2020  . Basal cell carcinoma (BCC) in situ of skin 04/04/2020  . Essential hypertension 03/21/2019  . Stage 3a chronic kidney disease   . Systolic murmur 44/12/4740  . Hypercalcemia   . AKI (acute kidney injury) ([place])   . Dizziness 02/22/2019  . Squamous cell carcinoma in situ of skin 11/30/2018  . Diarrhea 09/08/2017  . Nausea vomiting and diarrhea 09/08/2017  . Elevated serum creatinine 09/08/2017  . PVC (premature ventricular contraction) 09/08/2017  . Glaucoma 09/08/2017  . Hx of meningioma of the brain 08/28/2017  . Hoarseness of voice 02/28/2016  . Raynaud's phenomenon 02/28/2016  . Esophageal spasm 09/20/2013  . Osteoporosis 03/18/2012  . Fracture, shoulder 03/06/2012  . Hyperlipidemia   . Breast cancer [facility])     Past Medical History:  Diagnosis Date  . Autoimmune disease ([place])   . Barrett's esophagus   . Bone lesion    sacrum  . Breast cancer ([place]) 2010   cT1 N0 M0; ER+/ PR+/ HER2 neg; s/p lumpectomy 07/2010; started adjuvant hormonal therapy 10/2009  . Cervical cancer ([place])   . Dysphagia   . Fx distal radius NEC-closed   . Glaucoma   . Humerus fracture 03/2012   impacted left humueral neck fracture, treated by Dr. Amedeo Plenty  . Hyperlipidemia   . Hypertension   . Hypothyroidism   . Osteoporosis 03/18/2012  . Pneumonia   . Thyroid disease  Past Surgical History:  Procedure Laterality Date  . ABDOMINAL HYSTERECTOMY  1972   cancer          1 tube 1 ovary  . APPENDECTOMY    . BREAST LUMPECTOMY Left 2010  . colon polyp removal    . EYE SURGERY    . HAND SURGERY    . KYPHOPLASTY N/A 11/28/2020   Procedure: KYPHOPLASTY THORACIC TWELVE AND LUMBAR ONE;  Surgeon: Melina Schools, MD;  Location: [facility];  Service: Orthopedics;  Laterality: N/A;  90 mins  . ortho procedures     multiple  . OVARIAN CYST REMOVAL  1978  . SHOULDER SURGERY  1954   left    Social History   Tobacco Use  . Smoking status: Former Smoker    Quit date: 12/16/1986    Years since  quitting: 34.0  . Smokeless tobacco: Never Used  . Tobacco comment: FORMER SMOKER 30 YEARS AGO  Vaping Use  . Vaping Use: Never used  Substance Use Topics  . Alcohol use: Yes    Alcohol/week: 0.0 standard drinks    Comment: OCCASIONALLY  . Drug use: No    Family History  Problem Relation Age of Onset  . Heart disease Father   . Cancer Sister        lung, kidney  . Cancer Brother        brain  . Cancer Paternal Aunt        breast  . Breast cancer Paternal Aunt   . Cancer Brother        liver, lung  . Osteoporosis Neg Hx     Allergies  Allergen Reactions  . Daypro [Oxaprozin] Swelling    Face and tongue  . Prolia [Denosumab]     unknown    Medication list has been reviewed and updated.  Current Outpatient Medications on File Prior to Visit  Medication Sig Dispense Refill  . amLODipine (NORVASC) 2.5 MG tablet Take 2.5 mg by mouth daily.    . Calcium Carbonate-Vitamin D (CALCIUM + D PO) Take 1 capsule by mouth daily.     Marland Kitchen latanoprost (XALATAN) 0.005 % ophthalmic solution Place 1 drop into both eyes at bedtime.     Marland Kitchen levothyroxine (SYNTHROID) 50 MCG tablet TAKE 1 TABLET BY MOUTH DAILY BEFORE BREAKFAST (Patient taking differently: Take 50 mcg by mouth daily before breakfast.) 90 tablet 3  . Multiple Vitamin (MULTIVITAMIN WITH MINERALS) TABS tablet Take 1 tablet by mouth daily. One a day    . rosuvastatin (CRESTOR) 10 MG tablet Take 1 tablet (10 mg total) by mouth daily. TAKE 1 BY MOUTH DAILY (Patient taking differently: Take 10 mg by mouth daily.) 90 tablet 1  . sulfamethoxazole-trimethoprim (BACTRIM DS) 800-160 MG tablet Take 1 tablet by mouth 2 (two) times daily. 6 tablet 0   No current facility-administered medications on file prior to visit.    Review of Systems:  As per HPI- otherwise negative.   Physical Examination: Vitals:   12/12/20 0842  BP: 140/88  Pulse: 79  Resp: 17  Temp: (!) 97.5 F (36.4 C)  SpO2: 98%   Vitals:   12/12/20 0842  Weight: 110  lb (49.9 kg)  Height: _0  (1.6 m)   Body mass index is 19.49 kg/m. Ideal Body Weight: Weight in (lb) to have BMI = 25: 140.8  GEN: no acute distress.  Slender build, looks well HEENT: Atraumatic, Normocephalic.  Ears and Nose: No external deformity. CV: RRR, No M/G/R. No JVD. No thrill.  No extra heart sounds. PULM: CTA B, no wheezes, crackles, rhonchi. No retractions. No resp. distress. No accessory muscle use. EXTR: No c/c/e PSYCH: Normally interactive. Conversant.  Well-healed surgical incisions on her back from recent kyphoplasty Full-thickness skin tear on the dorsum of the left hand is healing through secondary intention.  It does not appear infected   Assessment and Plan: Acquired hypothyroidism - Plan: TSH  Mixed hyperlipidemia  Essential hypertension  Skin tear of left hand without complication, subsequent encounter  Went up today on his skin tear which occurred recently from removal of IV tape.  This appears to be healing through secondary intention.  Advised patient to continue keeping the wound clean and protected with a nonstick dressing.  She will let us know if not continuing to heal  Blood pressure under good control Check TSH today- Will plan further follow- up pending labs. Asked her to see me in 4 to 6 months, sooner if any concerns This visit occurred during the SARS-CoV-2 public health emergency.  Safety protocols were in place, including screening questions prior to the visit, additional usage of staff PPE, and extensive cleaning of exam room while observing appropriate contact time as indicated for disinfecting solutions.    Signed Lamar Blinks, MD  Received TSH- message to pt Will increase levothyroxine from 50 to 75 mcg Results for orders placed or performed in visit on 12/12/20  TSH  Result Value Ref Range   TSH 6.39 (H) 0.35 - 4.50 uIU/mL

## 2020-12-12 ENCOUNTER — Encounter: Payer: Self-pay | Admitting: Family Medicine

## 2020-12-12 ENCOUNTER — Ambulatory Visit (INDEPENDENT_AMBULATORY_CARE_PROVIDER_SITE_OTHER): Payer: Medicare Other | Admitting: Family Medicine

## 2020-12-12 ENCOUNTER — Other Ambulatory Visit: Payer: Self-pay

## 2020-12-12 VITALS — BP 140/88 | HR 79 | Temp 97.5°F | Resp 17 | Ht 63.0 in | Wt 110.0 lb

## 2020-12-12 DIAGNOSIS — E039 Hypothyroidism, unspecified: Secondary | ICD-10-CM | POA: Diagnosis not present

## 2020-12-12 DIAGNOSIS — E782 Mixed hyperlipidemia: Secondary | ICD-10-CM | POA: Diagnosis not present

## 2020-12-12 DIAGNOSIS — S61412D Laceration without foreign body of left hand, subsequent encounter: Secondary | ICD-10-CM

## 2020-12-12 DIAGNOSIS — I10 Essential (primary) hypertension: Secondary | ICD-10-CM | POA: Diagnosis not present

## 2020-12-13 ENCOUNTER — Ambulatory Visit (INDEPENDENT_AMBULATORY_CARE_PROVIDER_SITE_OTHER): Payer: Medicare Other | Admitting: Neurology

## 2020-12-13 ENCOUNTER — Telehealth: Payer: Self-pay | Admitting: Family Medicine

## 2020-12-13 ENCOUNTER — Encounter: Payer: Self-pay | Admitting: Family Medicine

## 2020-12-13 ENCOUNTER — Encounter: Payer: Self-pay | Admitting: Neurology

## 2020-12-13 VITALS — BP 166/99 | HR 64 | Ht 63.0 in | Wt 112.2 lb

## 2020-12-13 DIAGNOSIS — R55 Syncope and collapse: Secondary | ICD-10-CM | POA: Diagnosis not present

## 2020-12-13 LAB — TSH: TSH: 6.39 u[IU]/mL — ABNORMAL HIGH (ref 0.35–4.50)

## 2020-12-13 MED ORDER — LEVOTHYROXINE SODIUM 75 MCG PO TABS
ORAL_TABLET | ORAL | 3 refills | Status: DC
Start: 2020-12-13 — End: 2021-03-12

## 2020-12-13 NOTE — Progress Notes (Signed)
Follow-up Visit   Date: 12/13/20   [name] MRN: [id number] DOB: 06-21-1931   Interim History: [name] is a 87 y.o. right-handed Caucasian female with hyperlipidemia, history of breast cancer s/p lumpectomy/radiation (2015), COVID infection (2021), fall c/b ICH and SAH returning to the clinic for follow-up of syncope.    She has not had any further falls of syncopal events.  She continues to feel tired and deconditioned as compared to prior to her hospitalization in the fall, because she was walking up yo 7 miles daily, now has become very sedentary.  Her neurological work-up and cardiac monitor has been normal, without explanation for her syncopal event.    Since her last visit, she underwent T12 and L1 kyphoplasty and reports significant back pain today.    Medications:  Current Outpatient Medications on File Prior to Visit  Medication Sig Dispense Refill  . amLODipine (NORVASC) 2.5 MG tablet Take 2.5 mg by mouth daily.    . Calcium Carbonate-Vitamin D (CALCIUM + D PO) Take 1 capsule by mouth daily.     Marland Kitchen latanoprost (XALATAN) 0.005 % ophthalmic solution Place 1 drop into both eyes at bedtime.     Marland Kitchen levothyroxine (SYNTHROID) 50 MCG tablet TAKE 1 TABLET BY MOUTH DAILY BEFORE BREAKFAST (Patient taking differently: Take 50 mcg by mouth daily before breakfast.) 90 tablet 3  . Multiple Vitamin (MULTIVITAMIN WITH MINERALS) TABS tablet Take 1 tablet by mouth daily. One a day    . rosuvastatin (CRESTOR) 10 MG tablet Take 1 tablet (10 mg total) by mouth daily. TAKE 1 BY MOUTH DAILY (Patient taking differently: Take 10 mg by mouth daily.) 90 tablet 1   No current facility-administered medications on file prior to visit.    Allergies:  Allergies  Allergen Reactions  . Daypro [Oxaprozin] Swelling    Face and tongue  . Prolia [Denosumab]     unknown    Vital Signs:  BP (!) 166/99   Pulse 64   Ht 5\' 3"  (1.6 m)   Wt 112 lb 3.2 oz (50.9 kg)   SpO2 100%   BMI 19.88 kg/m    Neurological Exam: MENTAL STATUS including orientation to time, place, person, recent and remote memory, attention span and concentration, language, and fund of knowledge is normal.  Speech is not dysarthric.  CRANIAL NERVES:  Normal conjugate, extra-ocular eye movements in all directions of gaze.  No ptosis.    MOTOR:  Motor strength is 5/5 in all extremities.  No atrophy, fasciculations or abnormal movements.  No pronator drift.  Tone is normal.    MSRs:  Reflexes are 2+/4 throughout.  SENSORY:  Intact to vibration throughout.  COORDINATION/GAIT:  Gait narrow based and stable.   Data: Lab Results  Component Value Date   [id number] 10/14/2017     IMPRESSION/PLAN: Syncopal event with traumatic fall complicated by [facility] and ICH (04/2020).  Work-up including MRI brain, US carotids, MRA head, EEG, cardiac monitoring, and echo has been normal.  Fortunately, she remains asymptomatic.   Low back pain s/p T12 and L1 kyphoplasty by orthopeadics.  She is complains of severe low back pain today.  Recommend that she take pain medication as prescribed by her surgeon and contact their office to start PT, since she has not heard back to get this scheduled.  She is very driven to be active again and physical therapy will help her recovery.   Return to clinic as needed   Thank you for allowing me  to participate in patient's care.  If I can answer any additional questions, I would be pleased to do so.    Sincerely,    Jaevin Medearis K. Posey Pronto, DO

## 2020-12-13 NOTE — Addendum Note (Signed)
Addended by: Lamar Blinks C on: 12/13/2020 07:49 PM   Modules accepted: Orders

## 2020-12-13 NOTE — Telephone Encounter (Signed)
CallerDorie Mccoy  Call Back @ [phone]  Patient states she reviewed her medication list, patient states that her MCG for synthroid is not correct . Patient states she taking 0.05 MCG of synthroid not 56mcg. Patient would like that corrected

## 2020-12-13 NOTE — Telephone Encounter (Signed)
Called and spoke with patient. She was a little confused on dose. 0.05 mg =50 mcg. This is the same dose just written different.

## 2020-12-21 ENCOUNTER — Encounter: Payer: Self-pay | Admitting: Physical Therapy

## 2020-12-21 ENCOUNTER — Ambulatory Visit: Payer: Medicare Other | Attending: Orthopedic Surgery | Admitting: Physical Therapy

## 2020-12-21 ENCOUNTER — Other Ambulatory Visit: Payer: Self-pay

## 2020-12-21 DIAGNOSIS — M6281 Muscle weakness (generalized): Secondary | ICD-10-CM | POA: Diagnosis not present

## 2020-12-21 DIAGNOSIS — R293 Abnormal posture: Secondary | ICD-10-CM

## 2020-12-21 DIAGNOSIS — G8929 Other chronic pain: Secondary | ICD-10-CM | POA: Diagnosis not present

## 2020-12-21 DIAGNOSIS — R262 Difficulty in walking, not elsewhere classified: Secondary | ICD-10-CM | POA: Diagnosis not present

## 2020-12-21 DIAGNOSIS — M545 Low back pain, unspecified: Secondary | ICD-10-CM | POA: Insufficient documentation

## 2020-12-21 NOTE — Therapy (Signed)
[facility] [address]  [place], [place], [address] Phone: [phone]   Fax:  [phone]  Physical Therapy Evaluation  Patient Details  Name: [name] MRN: [id number] Date of Birth: 07/26/1931 Referring Provider (PT): Melina Schools, MD   Encounter Date: 12/21/2020   PT End of Session - 12/21/20 0800    Visit Number 1    Number of Visits 3    Date for PT Re-Evaluation 01/11/21    Authorization Type BCBS Medicare    PT Start Time 0800    PT Stop Time 0901    PT Time Calculation (min) 61 min    Activity Tolerance Patient tolerated treatment well;Patient limited by pain    Behavior During Therapy [facility] for tasks assessed/performed           Past Medical History:  Diagnosis Date  . Autoimmune disease ([place])   . Barrett's esophagus   . Bone lesion    sacrum  . Breast cancer ([place]) 2010   cT1 N0 M0; ER+/ PR+/ HER2 neg; s/p lumpectomy 07/2010; started adjuvant hormonal therapy 10/2009  . Cervical cancer ([place])   . Dysphagia   . Fx distal radius NEC-closed   . Glaucoma   . Humerus fracture 03/2012   impacted left humueral neck fracture, treated by Dr. Amedeo Plenty  . Hyperlipidemia   . Hypertension   . Hypothyroidism   . Osteoporosis 03/18/2012  . Pneumonia   . Thyroid disease     Past Surgical History:  Procedure Laterality Date  . ABDOMINAL HYSTERECTOMY  1972   cancer          1 tube 1 ovary  . APPENDECTOMY    . BACK SURGERY  11/28/2020  . BREAST LUMPECTOMY Left 2010  . colon polyp removal    . EYE SURGERY    . HAND SURGERY    . KYPHOPLASTY N/A 11/28/2020   Procedure: KYPHOPLASTY THORACIC TWELVE AND LUMBAR ONE;  Surgeon: Melina Schools, MD;  Location: [facility];  Service: Orthopedics;  Laterality: N/A;  90 mins  . ortho procedures     multiple  . OVARIAN CYST REMOVAL  1978  . [place]   left    There were no vitals filed for this visit.    Subjective Assessment - 12/21/20 0804     Subjective Pt reports she passed out at church in late July 2021 sustaining T12 and L1 compressoin fractures. She notes she was hospitalized for several days following this with diagnoses of ICH/SAH. She underwent kyphoplasty of T12 and L1 ~3 weeks ago. Still having pain since kyphoplasties but has only had to take prescription pain meds only once and ususally manages her pain with Tylenol.    Limitations Sitting;Standing;Walking;House hold activities;Lifting    How long can you sit comfortably? 1 hr    How long can you stand comfortably? <10 min    How long can you walk comfortably? 10 min    Patient Stated Goals "to get back to where I can walk 30 min w/o my back bothering me and do stuff in the kitchen"    Currently in Pain? Yes    Pain Score 4    3-4/10   Pain Location Back    Pain Orientation Mid;Lower    Pain Descriptors / Indicators Sore;Tightness;Spasm;Stabbing    Pain Type Chronic pain    Pain Radiating Towards n/a    Pain Onset More than a month ago   July 2022  Aggravating Factors  twisting motions, coughing or sneezing    Pain Relieving Factors cold pack & Tylenol, rarely uses prescription pain meds    Effect of Pain on Daily Activities limited standing and walking tolerance              [facility] PT Assessment - 12/21/20 0800      Assessment   Medical Diagnosis LBP s/p T12 & L1 kyphoplasty    Referring Provider (PT) Melina Schools, MD    Onset Date/Surgical Date 11/28/20    Next MD Visit 01/11/21    Prior Therapy HH PT following hospitalization last year      Precautions   Precautions Back      Restrictions   Weight Bearing Restrictions No      Balance Screen   Has the patient fallen in the past 6 months No    Has the patient had a decrease in activity level because of a fear of falling?  No    Is the patient reluctant to leave their home because of a fear of falling?  No      Home Ecologist residence    Living Arrangements  Spouse/significant other    Available Help at Discharge Family    Type of [place] to enter    Entrance Stairs-Number of Steps 4-5    Entrance Stairs-Rails Right    [place] One level      Prior Function   Level of Independence Independent    Vocation Retired    Leisure walk 5-7 miles per day, crossword puzzles      Cognition   Overall Cognitive Status Within Functional Limits for tasks assessed      Observation/Other Assessments   Focus on Therapeutic Outcomes (FOTO)  Lumbar - FS=47, predicted FS=57      Sensation   Light Touch Appears Intact      Posture/Postural Control   Posture/Postural Control Postural limitations    Postural Limitations Forward head;Rounded Shoulders;Increased thoracic kyphosis;Decreased lumbar lordosis;Flexed trunk      ROM / Strength   AROM / PROM / Strength AROM;Strength      AROM   AROM Assessment Site Lumbar    Lumbar Flexion --    Lumbar Extension 50% limited    Lumbar - Right Side Bend --    Lumbar - Right Rotation --      Strength   Overall Strength Comments B LE grossly 4 to 4+/5 at hips and 4+ to 5/5 at knees and ankles    Strength Assessment Site Hip;Knee;Ankle      Flexibility   Soft Tissue Assessment /Muscle Length yes    Hamstrings mod tight B    Quadriceps mod tight B                      Objective measurements completed on examination: See above findings.       [facility] PT Treatment/Exercise - 12/21/20 0800      Self-Care   Self-Care Other Self-Care Comments    Other Self-Care Comments  Provided education on osteoporosis including decreasing fracture risk through posture and body mechanics awareness with everyday activities, movement patterns and exercise.      Modalities   Modalities Moist Heat      Moist Heat Therapy   Number Minutes Moist Heat 12 Minutes   during patient education   Moist Heat Location Lumbar Spine  PT Education - 12/21/20  0900    Education Details PT eval findings, anticipated POC, osteoporosis education    Person(s) Educated Patient    Methods Explanation;Demonstration;Handout    Comprehension Verbalized understanding;Need further instruction               PT Long Term Goals - 12/21/20 0901      PT LONG TERM GOAL #1   Title Patient will be independent with ongoing HEP for self-management at home    Status New    Target Date 01/11/21      PT LONG TERM GOAL #2   Title Patient to verbalize/demonstrate understanding of appropriate posture and body mechanics needed for daily activities to reduce risk for osteoporotic fractures    Status New    Target Date 01/11/21                  Plan - 12/21/20 0901    Clinical Impression Statement [name] is an 87 y/o female who presents to OP PT with ongoing LBP ~3 weeks s/p T12 & L1 kyphoplasties on 11/28/20. She reports compression fractures occurred in late July 2021 around the time she experienced a fall at church and was hospitalized for an ICH/SAH. Pain prevents her from walking for exercise (normally walks 5-7 miles a day) and interferes with daily tasks such as meal prep and washing dishes. Deficits include intermittent LBP w/o LE radiculopathy but pt did note occasional urinary incontinence, abnormal posture, decreased lumbar AROM in all planes, limited proximal LE flexibility and mild proximal LE weakness. Darren will benefit from skilled PT to establish a HEP to address above deficits to reduce pain and tightness and improve core strength to restore normal mobility and allow for increased participation in desired activities. Education provided today on osteoporosis including decreasing fracture risk through posture and body mechanics awareness with everyday activities, movement patterns and exercise. [name] able to demonstrate good bed mobility and supine <> sit via log rolling and sidelying <> sit with neutral spine.    Personal Factors and Comorbidities  Age;Comorbidity 3+;Past/Current Experience;Time since onset of injury/illness/exacerbation    Comorbidities Unwitnessed fall with resultant [facility] 04/29/20 - T12 compression fx occurring around this time with L1 compression fx apparently already chronic at the time, osteoporosis with h/o humerus fx 03/2012, HTN, HLD, prior breast cancer, Raynaud's and hypothyroidism    Examination-Activity Limitations [place];Caring for Others;Lift;Carry;Stand;Locomotion Level;Transfers;Bed Mobility    Examination-Participation Restrictions Church;Cleaning;Community Activity   walking for exercise   Stability/Clinical Decision Making Stable/Uncomplicated    Clinical Decision Making Low    Rehab Potential Good    PT Frequency 1x / week    PT Duration 2 weeks    PT Treatment/Interventions ADLs/Self Care Home Management;Cryotherapy;Electrical Stimulation;Moist Heat;Functional mobility training;Therapeutic activities;Therapeutic exercise;Neuromuscular re-education;Balance training;Patient/family education;Manual techniques;Passive range of motion    PT Next Visit Plan Initiate HEP - decompression exercises + gentle postural & lumbopelvic flexibilty and strengthening; address any questions re: osteoporosis and posture/body mechanics education    Consulted and Agree with Plan of Care Patient           Patient will benefit from skilled therapeutic intervention in order to improve the following deficits and impairments:  Decreased activity tolerance,Decreased balance,Decreased endurance,Decreased knowledge of precautions,Decreased mobility,Decreased range of motion,Decreased safety awareness,Decreased strength,Difficulty walking,Increased muscle spasms,Impaired perceived functional ability,Impaired flexibility,Improper body mechanics,Postural dysfunction,Pain  Visit Diagnosis: Chronic midline low back pain without sciatica  Abnormal posture  Difficulty in walking, not elsewhere classified  Muscle weakness  (generalized)  Problem List Patient Active Problem List   Diagnosis Date Noted  . Intracranial hemorrhage ([place])   . Acute blood loss anemia   . TBI (traumatic brain injury) ([place]) 05/04/2020  . Back pain 05/04/2020  . SAH (subarachnoid hemorrhage) ([place]) 04/29/2020  . Fall at home, initial encounter 04/29/2020  . Orbital fracture, closed, initial encounter ([place]) 04/29/2020  . Basal cell carcinoma (BCC) in situ of skin 04/04/2020  . Essential hypertension 03/21/2019  . Stage 3a chronic kidney disease   . Systolic murmur 32/99/2426  . Hypercalcemia   . AKI (acute kidney injury) ([place])   . Dizziness 02/22/2019  . Squamous cell carcinoma in situ of skin 11/30/2018  . Diarrhea 09/08/2017  . Nausea vomiting and diarrhea 09/08/2017  . Elevated serum creatinine 09/08/2017  . PVC (premature ventricular contraction) 09/08/2017  . Glaucoma 09/08/2017  . Hx of meningioma of the brain 08/28/2017  . Hoarseness of voice 02/28/2016  . Raynaud's phenomenon 02/28/2016  . Esophageal spasm 09/20/2013  . Osteoporosis 03/18/2012  . Fracture, shoulder 03/06/2012  . Hyperlipidemia   . Breast cancer [facility])     Percival Spanish, PT, MPT 12/21/2020, 1:34 PM  [facility] [address]  [place], [place], [address] Phone: [phone]   Fax:  [phone]  Name: [name] MRN: [id number] Date of Birth: 11/08/1930

## 2020-12-21 NOTE — Patient Instructions (Signed)
Do It Right and Prevent Fractures: [email or web address]

## 2020-12-23 ENCOUNTER — Encounter: Payer: Self-pay | Admitting: Cardiology

## 2020-12-23 DIAGNOSIS — I471 Supraventricular tachycardia, unspecified: Secondary | ICD-10-CM

## 2020-12-23 HISTORY — DX: Supraventricular tachycardia, unspecified: I47.10

## 2020-12-23 HISTORY — DX: Supraventricular tachycardia: I47.1

## 2020-12-24 ENCOUNTER — Telehealth: Payer: Self-pay | Admitting: Family Medicine

## 2020-12-24 NOTE — Telephone Encounter (Signed)
Patient called she would like the nurse to send a copy of her last lab result via mail to her home address

## 2020-12-24 NOTE — Telephone Encounter (Signed)
Labs printed and mailed to patient

## 2020-12-26 ENCOUNTER — Ambulatory Visit (INDEPENDENT_AMBULATORY_CARE_PROVIDER_SITE_OTHER): Payer: Medicare Other | Admitting: Family Medicine

## 2020-12-26 ENCOUNTER — Telehealth: Payer: Self-pay

## 2020-12-26 ENCOUNTER — Encounter: Payer: Self-pay | Admitting: Family Medicine

## 2020-12-26 ENCOUNTER — Other Ambulatory Visit: Payer: Self-pay

## 2020-12-26 VITALS — BP 124/78 | HR 70 | Temp 98.1°F | Resp 18 | Ht 63.0 in | Wt 112.0 lb

## 2020-12-26 DIAGNOSIS — L03312 Cellulitis of back [any part except buttock]: Secondary | ICD-10-CM | POA: Diagnosis not present

## 2020-12-26 MED ORDER — DOXYCYCLINE HYCLATE 100 MG PO TABS: 100.0000 mg | ORAL_TABLET | Freq: Two times a day (BID) | ORAL | 0 refills | Status: AC

## 2020-12-26 NOTE — Telephone Encounter (Signed)
Caller states she needs to know if Dr. Lorelei Pont will be in the office today. Caller states she has a red spot that is sore on her back.  Telephone: [phone]

## 2020-12-26 NOTE — Progress Notes (Signed)
Chief Complaint  Patient presents with  . Back Pain    Back surgery one month ago, using heating pad, redness in back-lower to mid back    [name] is a 87 y.o. female here for a skin complaint.  Duration: 1 day Location: lower back Pruritic? No Painful? Yes Drainage? No New soaps/lotions/topicals/detergents? No Sick contacts? No Other associated symptoms: +redness Had back surgery 1 mo ago, had been progressing well Therapies tried thus far: TAO, heating pad  Past Medical History:  Diagnosis Date  . Autoimmune disease ([place])   . Barrett's esophagus   . Bone lesion    sacrum  . Breast cancer ([place]) 2010   cT1 N0 M0; ER+/ PR+/ HER2 neg; s/p lumpectomy 07/2010; started adjuvant hormonal therapy 10/2009  . Cervical cancer ([place])   . Dysphagia   . Fx distal radius NEC-closed   . Glaucoma   . Humerus fracture 03/2012   impacted left humueral neck fracture, treated by Dr. Amedeo Plenty  . Hyperlipidemia   . Hypertension   . Hypothyroidism   . Osteoporosis 03/18/2012  . Pneumonia   . Thyroid disease     BP 124/78 (BP Location: Left Arm, Patient Position: Sitting, Cuff Size: Normal)   Pulse 70   Temp 98.1 F (36.7 C)   Resp 18   Ht $R'5\' 3"'fg$  (1.6 m)   Wt 112 lb (50.8 kg)   SpO2 99%   BMI 19.84 kg/m  Gen: awake, alert, appearing stated age Lungs: No accessory muscle use Skin: See below. +erythema, TTP; no fluctuance, excoriation Psych: Age appropriate judgment and insight   Lower thoracic spine region  Cellulitis of back except buttock - Plan: doxycycline (VIBRA-TABS) 100 MG tablet  7 days, twice daily. Warning signs and symptoms verbalized and written down in AVS.  F/u prn. The patient voiced understanding and agreement to the plan.  [place], DO 12/26/20 11:41 AM

## 2020-12-26 NOTE — Patient Instructions (Addendum)
Ice/cold pack over area for 10-15 min twice daily.  OK to take Tylenol 1000 mg (2 extra strength tabs) or 975 mg (3 regular strength tabs) every 6 hours as needed.  When you do wash it, use only soap and water. Do not vigorously scrub.  Keep the area clean and dry.   Things to look out for: increasing pain not relieved by acetaminophen, fevers, spreading redness, drainage of pus, or foul odor.  Let us know if you need anything.

## 2020-12-27 NOTE — Telephone Encounter (Signed)
Pt seen by Dr. Nani Ravens.

## 2020-12-31 ENCOUNTER — Other Ambulatory Visit: Payer: Self-pay

## 2020-12-31 ENCOUNTER — Encounter: Payer: Self-pay | Admitting: Family Medicine

## 2020-12-31 ENCOUNTER — Ambulatory Visit (INDEPENDENT_AMBULATORY_CARE_PROVIDER_SITE_OTHER): Payer: Medicare Other | Admitting: Family Medicine

## 2020-12-31 ENCOUNTER — Encounter: Payer: Self-pay | Admitting: Physical Therapy

## 2020-12-31 ENCOUNTER — Ambulatory Visit: Payer: Medicare Other | Admitting: Physical Therapy

## 2020-12-31 VITALS — BP 108/80 | HR 63 | Temp 97.8°F | Resp 20 | Ht 63.0 in | Wt 110.0 lb

## 2020-12-31 DIAGNOSIS — Z4889 Encounter for other specified surgical aftercare: Secondary | ICD-10-CM | POA: Diagnosis not present

## 2020-12-31 DIAGNOSIS — R293 Abnormal posture: Secondary | ICD-10-CM

## 2020-12-31 DIAGNOSIS — L0390 Cellulitis, unspecified: Secondary | ICD-10-CM | POA: Diagnosis not present

## 2020-12-31 DIAGNOSIS — M546 Pain in thoracic spine: Secondary | ICD-10-CM | POA: Diagnosis not present

## 2020-12-31 DIAGNOSIS — G8929 Other chronic pain: Secondary | ICD-10-CM

## 2020-12-31 DIAGNOSIS — M6281 Muscle weakness (generalized): Secondary | ICD-10-CM

## 2020-12-31 DIAGNOSIS — R262 Difficulty in walking, not elsewhere classified: Secondary | ICD-10-CM

## 2020-12-31 HISTORY — DX: Other chronic pain: G89.29

## 2020-12-31 HISTORY — DX: Cellulitis, unspecified: L03.90

## 2020-12-31 NOTE — Patient Instructions (Signed)
You will see Cleta Alberts PA at [facility] at 230 pm

## 2020-12-31 NOTE — Assessment & Plan Note (Signed)
S/p kyphplasty ---- pain is worsening  Pt will see PA at [facility] today

## 2020-12-31 NOTE — Assessment & Plan Note (Signed)
Improving  Finish abx  Pt to see ortho today

## 2020-12-31 NOTE — Progress Notes (Signed)
Patient ID: [name], female    DOB: May 24, 1931  Age: 87 y.o. MRN: [id number]    Subjective:  Subjective  HPI [name] presents for an office visit today. She complains of back pain that has progressively worsened since July 17th/18th after "blacking out" in church one Sunday. This resulted in her to have back(2 compression fractures) and brain injury. She underwent spinal procedure, and notes that her wound is healing after being given 100 mg of Doxycycline PO daily. However she still has pain in this area that worsens with movement. She denies any chest pain, SOB, fever, abdominal pain, cough, chills, sore throat, dysuria, urinary incontinence, back pain, HA, or N/VD at this time.    Review of Systems  Constitutional: Negative for chills, fatigue and fever.  HENT: Negative for congestion, ear pain, sinus pain and sore throat.   Eyes: Negative for pain.  Respiratory: Negative for shortness of breath.   Cardiovascular: Negative for chest pain, palpitations and leg swelling.  Gastrointestinal: Negative for abdominal pain, blood in stool, constipation, diarrhea, nausea and vomiting.  Genitourinary: Negative for dysuria, frequency, hematuria and urgency.  Musculoskeletal: Positive for back pain. Negative for myalgias.  Skin: Positive for wound. Negative for rash.  Neurological: Negative for headaches.    History Past Medical History:  Diagnosis Date  . Autoimmune disease ([place])   . Barrett's esophagus   . Bone lesion    sacrum  . Breast cancer ([place]) 2010   cT1 N0 M0; ER+/ PR+/ HER2 neg; s/p lumpectomy 07/2010; started adjuvant hormonal therapy 10/2009  . Cervical cancer ([place])   . Dysphagia   . Fx distal radius NEC-closed   . Glaucoma   . Humerus fracture 03/2012   impacted left humueral neck fracture, treated by [name]  . Hyperlipidemia   . Hypertension   . Hypothyroidism   . Osteoporosis 03/18/2012  . Pneumonia   . Thyroid disease     She has a past surgical history  that includes Hand surgery; Ovarian cyst removal (1978); Appendectomy; ortho procedures; Abdominal hysterectomy (1972); Shoulder surgery (1954); colon polyp removal; Eye surgery; Breast lumpectomy (Left, 2010); Kyphoplasty (N/A, 11/28/2020); and Back surgery (11/28/2020).   Her family history includes Breast cancer in her paternal aunt; Cancer in her brother, brother, paternal aunt, and sister; Heart disease in her father.She reports that she quit smoking about 34 years ago. She has never used smokeless tobacco. She reports current alcohol use. She reports that she does not use drugs.  Current Outpatient Medications on File Prior to Visit  Medication Sig Dispense Refill  . amLODipine (NORVASC) 2.5 MG tablet Take 2.5 mg by mouth daily.    . Calcium Carbonate-Vitamin D (CALCIUM + D PO) Take 1 capsule by mouth daily.     Marland Kitchen doxycycline (VIBRA-TABS) 100 MG tablet Take 1 tablet (100 mg total) by mouth 2 (two) times daily for 7 days. 14 tablet 0  . latanoprost (XALATAN) 0.005 % ophthalmic solution Place 1 drop into both eyes at bedtime.     Marland Kitchen levothyroxine (SYNTHROID) 75 MCG tablet TAKE 1 TABLET BY MOUTH DAILY BEFORE BREAKFAST 30 tablet 3  . Multiple Vitamin (MULTIVITAMIN WITH MINERALS) TABS tablet Take 1 tablet by mouth daily. One a day    . rosuvastatin (CRESTOR) 10 MG tablet Take 1 tablet (10 mg total) by mouth daily. TAKE 1 BY MOUTH DAILY (Patient taking differently: Take 10 mg by mouth daily.) 90 tablet 1   No current facility-administered medications on file prior to visit.  Objective:  Objective  Physical Exam Constitutional:      General: She is not in acute distress.    Appearance: Normal appearance. She is well-developed. She is not ill-appearing.  HENT:     Head: Normocephalic and atraumatic.     Right Ear: External ear normal.     Left Ear: External ear normal.     Nose: Nose normal.  Eyes:     Extraocular Movements: Extraocular movements intact.     Pupils: Pupils are equal,  round, and reactive to light.  Cardiovascular:     Rate and Rhythm: Normal rate and regular rhythm.     Pulses: Normal pulses.     Heart sounds: Normal heart sounds. No murmur heard.   Pulmonary:     Effort: Pulmonary effort is normal. No respiratory distress.     Breath sounds: Normal breath sounds. No wheezing or rales.  Chest:     Chest wall: No tenderness.  Abdominal:     General: Bowel sounds are normal.     Palpations: There is no mass.     Tenderness: There is no abdominal tenderness. There is no guarding or rebound.  Musculoskeletal:     Cervical back: Normal range of motion and neck supple.  Skin:    Comments: visible mild erythema on her wound site--- but much improved since last ov with [name]  Neurological:     Mental Status: She is alert and oriented to person, place, and time.  Psychiatric:        Behavior: Behavior normal.    BP 108/80 (BP Location: Right Arm, Patient Position: Sitting, Cuff Size: Normal)   Pulse 63   Temp 97.8 F (36.6 C) (Oral)   Resp 20   Ht $R'5\' 3"'RE$  (1.6 m)   Wt 110 lb (49.9 kg)   SpO2 100%   BMI 19.49 kg/m  Wt Readings from Last 3 Encounters:  12/31/20 110 lb (49.9 kg)  12/26/20 112 lb (50.8 kg)  12/13/20 112 lb 3.2 oz (50.9 kg)     Lab Results  Component Value Date   WBC 9.5 11/26/2020   HGB 14.0 11/26/2020   HCT 44.8 11/26/2020   PLT 284 11/26/2020   GLUCOSE 106 (H) 11/26/2020   CHOL 169 05/23/2019   TRIG 50.0 05/23/2019   HDL 78.70 05/23/2019   LDLCALC 80 05/23/2019   ALT 15 07/26/2020   AST 24 07/26/2020   NA 138 11/26/2020   K 4.0 11/26/2020   CL 102 11/26/2020   CREATININE 1.11 (H) 11/26/2020   BUN 17 11/26/2020   CO2 25 11/26/2020   TSH 6.39 (H) 12/12/2020   INR 1.0 11/26/2020    No results found.   Assessment & Plan:  Plan    No orders of the defined types were placed in this encounter.   Problem List Items Addressed This Visit      Unprioritized   Cellulitis of skin - Primary    Improving   Finish abx  Pt to see ortho today      Chronic midline thoracic back pain    S/p kyphplasty ---- pain is worsening  Pt will see PA at [facility] today          Follow-up: No follow-ups on file.   I,[name],acting as a Education administrator for [employer], DO.,have documented all relevant documentation on the behalf of Ann Held, DO,as directed by  Ann Held, DO while in the presence of Alferd Apa  Carollee Herter, DO.  I, Ann Held, DO, have reviewed all documentation for this visit. The documentation on 12/31/20 for the exam, diagnosis, procedures, and orders are all accurate and complete.

## 2020-12-31 NOTE — Therapy (Addendum)
[facility] [address]  [place], [place], [address] Phone: [phone]   Fax:  [phone]  Physical Therapy Note / Discharge Summary  Patient Details  Name: [name] MRN: [id number] Date of Birth: 1931-09-12 Referring Provider (PT): Melina Schools, MD   Encounter Date: 12/31/2020   PT End of Session - 12/31/20 0850    Visit Number 1    Number of Visits 3    Date for PT Re-Evaluation 01/11/21    Authorization Type BCBS Medicare    PT Start Time 0850    PT Stop Time 0922    PT Time Calculation (min) 32 min    Activity Tolerance Patient limited by pain;Treatment limited secondary to medical complications (Comment)    Behavior During Therapy [facility] for tasks assessed/performed           Past Medical History:  Diagnosis Date  . Autoimmune disease ([place])   . Barrett's esophagus   . Bone lesion    sacrum  . Breast cancer ([place]) 2010   cT1 N0 M0; ER+/ PR+/ HER2 neg; s/p lumpectomy 07/2010; started adjuvant hormonal therapy 10/2009  . Cervical cancer ([place])   . Dysphagia   . Fx distal radius NEC-closed   . Glaucoma   . Humerus fracture 03/2012   impacted left humueral neck fracture, treated by Dr. Amedeo Plenty  . Hyperlipidemia   . Hypertension   . Hypothyroidism   . Osteoporosis 03/18/2012  . Pneumonia   . Thyroid disease     Past Surgical History:  Procedure Laterality Date  . ABDOMINAL HYSTERECTOMY  1972   cancer          1 tube 1 ovary  . APPENDECTOMY    . BACK SURGERY  11/28/2020  . BREAST LUMPECTOMY Left 2010  . colon polyp removal    . EYE SURGERY    . HAND SURGERY    . KYPHOPLASTY N/A 11/28/2020   Procedure: KYPHOPLASTY THORACIC TWELVE AND LUMBAR ONE;  Surgeon: Melina Schools, MD;  Location: [facility];  Service: Orthopedics;  Laterality: N/A;  90 mins  . ortho procedures     multiple  . OVARIAN CYST REMOVAL  1978  . [place]   left    There were no vitals filed for this visit.    Subjective Assessment - 12/31/20 0903    Subjective Pt reports she went to see the MD last Wed 3/16 due to increased pain and redness on her back at site of kyphoplasty. MD prescribed antibiotics and told her to use ice and/or Tylenol due to pain. Pain remains significant limiting her mobility, daily tasks and appetiite. Pt leike her back is swollen making more difficult to breathe.    Limitations Sitting;Standing;Walking;House hold activities;Lifting    How long can you sit comfortably? --    How long can you stand comfortably? --    How long can you walk comfortably? --    Patient Stated Goals "to get back to where I can walk 30 min w/o my back bothering me and do stuff in the kitchen"    Currently in Pain? Yes    Pain Score 5    up to 11/10 with some movements   Pain Location Back    Pain Orientation Mid;Lower    Pain Descriptors / Indicators Dull;Tightness    Pain Type Acute pain;Chronic pain;Surgical pain    Pain Onset --   July 2022  PT Long Term Goals - 12/21/20 0901      PT LONG TERM GOAL #1   Title Patient will be independent with ongoing HEP for self-management at home    Status New    Target Date 01/11/21      PT LONG TERM GOAL #2   Title Patient to verbalize/demonstrate understanding of appropriate posture and body mechanics needed for daily activities to reduce risk for osteoporotic fractures    Status New    Target Date 01/11/21                 Plan - 12/31/20 0932    Clinical Impression Statement [name] arrived to PT visit reporting increased pain worsening since yesterday. She reports she had seen her PCP last week and was told she had an infection at her procedural site and had been started on antibiotics. Procedural site remains very red despite being on antibiotics since Wed. When PT attempted to help pt lie down via sidelying to attempt HEP instruction with decompression exercises, pt  reporting severe (11/10) pain. Given ongoing redness and worsening pain, PT efforts discontinued and pt accompanied across the hall to the PCP office and pt was able to get an appointment to f/u with Dr. Nani Ravens for 9:20.    Personal Factors and Comorbidities Age;Comorbidity 3+;Past/Current Experience;Time since onset of injury/illness/exacerbation    Comorbidities Unwitnessed fall with resultant [facility] 04/29/20 - T12 compression fx occurring around this time with L1 compression fx apparently already chronic at the time, osteoporosis with h/o humerus fx 03/2012, HTN, HLD, prior breast cancer, Raynaud's and hypothyroidism    Examination-Activity Limitations [place];Caring for Others;Lift;Carry;Stand;Locomotion Level;Transfers;Bed Mobility    Examination-Participation Restrictions Church;Cleaning;Community Activity   walking for exercise   Rehab Potential Good    PT Frequency 1x / week    PT Duration 2 weeks    PT Treatment/Interventions ADLs/Self Care Home Management;Cryotherapy;Electrical Stimulation;Moist Heat;Functional mobility training;Therapeutic activities;Therapeutic exercise;Neuromuscular re-education;Balance training;Patient/family education;Manual techniques;Passive range of motion    PT Next Visit Plan Initiate HEP - decompression exercises + gentle postural & lumbopelvic flexibilty and strengthening; address any questions re: osteoporosis and posture/body mechanics education    Consulted and Agree with Plan of Care Patient           Patient will benefit from skilled therapeutic intervention in order to improve the following deficits and impairments:  Decreased activity tolerance,Decreased balance,Decreased endurance,Decreased knowledge of precautions,Decreased mobility,Decreased range of motion,Decreased safety awareness,Decreased strength,Difficulty walking,Increased muscle spasms,Impaired perceived functional ability,Impaired flexibility,Improper body mechanics,Postural  dysfunction,Pain  Visit Diagnosis: Chronic midline low back pain without sciatica  Abnormal posture  Difficulty in walking, not elsewhere classified  Muscle weakness (generalized)     Problem List Patient Active Problem List   Diagnosis Date Noted  . Paroxysmal SVT (supraventricular tachycardia) ([place]) 12/23/2020  . Intracranial hemorrhage ([place])   . Acute blood loss anemia   . TBI (traumatic brain injury) ([place]) 05/04/2020  . Back pain 05/04/2020  . SAH (subarachnoid hemorrhage) ([place]) 04/29/2020  . Fall at home, initial encounter 04/29/2020  . Orbital fracture, closed, initial encounter ([place]) 04/29/2020  . Basal cell carcinoma (BCC) in situ of skin 04/04/2020  . Essential hypertension 03/21/2019  . Stage 3a chronic kidney disease   . Systolic murmur 43/32/9518  . Hypercalcemia   . AKI (acute kidney injury) ([place])   . Dizziness 02/22/2019  . Squamous cell carcinoma in situ of skin 11/30/2018  . Diarrhea 09/08/2017  . Nausea vomiting and diarrhea 09/08/2017  . Elevated serum creatinine 09/08/2017  .  PVC (premature ventricular contraction) 09/08/2017  . Glaucoma 09/08/2017  . Hx of meningioma of the brain 08/28/2017  . Hoarseness of voice 02/28/2016  . Raynaud's phenomenon 02/28/2016  . Esophageal spasm 09/20/2013  . Osteoporosis 03/18/2012  . Fracture, shoulder 03/06/2012  . Hyperlipidemia   . Breast cancer ([place])     Percival Spanish, PT, MPT 12/31/2020, 9:41 AM  [facility] [address]  [place], [place], [address] Phone: [phone]   Fax:  [phone]  Name: [name] MRN: [id number] Date of Birth: 24-Aug-1931    PHYSICAL THERAPY DISCHARGE SUMMARY  Visits from Start of Care: 3  Current functional level related to goals / functional outcomes:   Refer to above clinical impression for status as of last visit on 12/31/2020. Patient was unable to continue with PT due to extreme pain and was  referred back to her PCP. Patient cancelled all of her remaining scheduled visits and has not returned to PT in >30 days, therefore will proceed with discharge from PT for this episode.   Remaining deficits:   As above.    Education / Equipment:   HEP  Plan: Patient agrees to discharge.  Patient goals were not met. Patient is being discharged due to a change in medical status.  ?????     Percival Spanish, PT, MPT 02/28/21, 1:37 PM  [facility] [place], [place], [address] Phone: [phone]   Fax:  [phone]

## 2021-01-01 ENCOUNTER — Other Ambulatory Visit: Payer: Self-pay | Admitting: Orthopedic Surgery

## 2021-01-01 DIAGNOSIS — M546 Pain in thoracic spine: Secondary | ICD-10-CM

## 2021-01-02 ENCOUNTER — Ambulatory Visit
Admission: RE | Admit: 2021-01-02 | Discharge: 2021-01-02 | Disposition: A | Discharge status: Home / Self Care | Payer: Medicare Other | Source: Ambulatory Visit | Attending: Orthopedic Surgery | Admitting: Orthopedic Surgery

## 2021-01-02 DIAGNOSIS — Z853 Personal history of malignant neoplasm of breast: Secondary | ICD-10-CM | POA: Diagnosis not present

## 2021-01-02 DIAGNOSIS — S22080A Wedge compression fracture of T11-T12 vertebra, initial encounter for closed fracture: Secondary | ICD-10-CM | POA: Diagnosis not present

## 2021-01-02 DIAGNOSIS — Z8542 Personal history of malignant neoplasm of other parts of uterus: Secondary | ICD-10-CM | POA: Diagnosis not present

## 2021-01-02 DIAGNOSIS — M546 Pain in thoracic spine: Secondary | ICD-10-CM

## 2021-01-02 DIAGNOSIS — M40204 Unspecified kyphosis, thoracic region: Secondary | ICD-10-CM | POA: Diagnosis not present

## 2021-01-09 ENCOUNTER — Encounter: Payer: Self-pay | Admitting: Physical Therapy

## 2021-01-10 ENCOUNTER — Ambulatory Visit (INDEPENDENT_AMBULATORY_CARE_PROVIDER_SITE_OTHER): Payer: Medicare Other

## 2021-01-10 VITALS — Ht 63.0 in | Wt 110.0 lb

## 2021-01-10 DIAGNOSIS — Z0000 Encounter for general adult medical examination without abnormal findings: Secondary | ICD-10-CM | POA: Diagnosis not present

## 2021-01-10 NOTE — Progress Notes (Signed)
Subjective:   [name] is a 87 y.o. female who presents for Medicare Annual (Subsequent) preventive examination.  I connected with [name] today by telephone and verified that I am speaking with the correct person using two identifiers. Location patient: home Location provider: work Persons participating in the virtual visit: patient, Marine scientist.    I discussed the limitations, risks, security and privacy concerns of performing an evaluation and management service by telephone and the availability of in person appointments. I also discussed with the patient that there may be a patient responsible charge related to this service. The patient expressed understanding and verbally consented to this telephonic visit.    Interactive audio and video telecommunications were attempted between this provider and patient, however failed, due to patient having technical difficulties OR patient did not have access to video capability.  We continued and completed visit with audio only.  Some vital signs may be absent or patient reported.   Time Spent with patient on telephone encounter: 25 minutes   Review of Systems     Cardiac Risk Factors include: advanced age (>71mn, >>52women);hypertension;dyslipidemia     Objective:    Today's Vitals   01/10/21 1101  Weight: 110 lb (49.9 kg)  Height: _0  (1.6 m)   Body mass index is 19.49 kg/m.  Advanced Directives 01/10/2021 12/21/2020 12/13/2020 12/03/2020 11/26/2020 06/15/2020 05/15/2020  Does Patient Have a Medical Advance Directive? _1  No No  Would patient like information on creating a medical advance directive? No - Patient declined No - Patient declined - No - Patient declined No - Patient declined - No - Patient declined    Current Medications (verified) Outpatient Encounter Medications as of 01/10/2021  Medication Sig  . amLODipine (NORVASC) 2.5 MG tablet Take 2.5 mg by mouth daily.  . Calcium Carbonate-Vitamin D (CALCIUM + D PO) Take 1  capsule by mouth daily.   .Marland Kitchenlatanoprost (XALATAN) 0.005 % ophthalmic solution Place 1 drop into both eyes at bedtime.   .Marland Kitchenlevothyroxine (SYNTHROID) 75 MCG tablet TAKE 1 TABLET BY MOUTH DAILY BEFORE BREAKFAST  . Multiple Vitamin (MULTIVITAMIN WITH MINERALS) TABS tablet Take 1 tablet by mouth daily. One a day  . rosuvastatin (CRESTOR) 10 MG tablet Take 1 tablet (10 mg total) by mouth daily. TAKE 1 BY MOUTH DAILY (Patient taking differently: Take 10 mg by mouth daily.)   No facility-administered encounter medications on file as of 01/10/2021.    Allergies (verified) Daypro [oxaprozin] and Prolia [denosumab]   History: Past Medical History:  Diagnosis Date  . Autoimmune disease ([place]   . Barrett's esophagus   . Bone lesion    sacrum  . Breast cancer ([place] 2010   cT1 N0 M0; ER+/ PR+/ HER2 neg; s/p lumpectomy 07/2010; started adjuvant hormonal therapy 10/2009  . Cervical cancer ([place]   . Dysphagia   . Fx distal radius NEC-closed   . Glaucoma   . Humerus fracture 03/2012   impacted left humueral neck fracture, treated by Dr. GAmedeo Plenty . Hyperlipidemia   . Hypertension   . Hypothyroidism   . Osteoporosis 03/18/2012  . Pneumonia   . Thyroid disease    Past Surgical History:  Procedure Laterality Date  . ABDOMINAL HYSTERECTOMY  1972   cancer          1 tube 1 ovary  . APPENDECTOMY    . BACK SURGERY  11/28/2020  . BREAST LUMPECTOMY Left 2010  . colon polyp removal    .  EYE SURGERY    . HAND SURGERY    . KYPHOPLASTY N/A 11/28/2020   Procedure: KYPHOPLASTY THORACIC TWELVE AND LUMBAR ONE;  Surgeon: Melina Schools, MD;  Location: [facility];  Service: Orthopedics;  Laterality: N/A;  90 mins  . ortho procedures     multiple  . OVARIAN CYST REMOVAL  1978  . SHOULDER SURGERY  1954   left   Family History  Problem Relation Age of Onset  . Heart disease Father   . Cancer Sister        lung, kidney  . Cancer Brother        brain  . Cancer Paternal Aunt        breast  . Breast cancer  Paternal Aunt   . Cancer Brother        liver, lung  . Osteoporosis Neg Hx    Social History   Socioeconomic History  . Marital status: Married    Spouse name: Not on file  . Number of children: 2  . Years of education: 18  . Highest education level: Not on file  Occupational History  . Occupation: retired  Tobacco Use  . Smoking status: Former Smoker    Quit date: 12/16/1986    Years since quitting: 34.0  . Smokeless tobacco: Never Used  . Tobacco comment: FORMER SMOKER 30 YEARS AGO  Vaping Use  . Vaping Use: Never used  Substance and Sexual Activity  . Alcohol use: Yes    Alcohol/week: 0.0 standard drinks    Comment: OCCASIONALLY  . Drug use: No  . Sexual activity: Not on file  Other Topics Concern  . Not on file  Social History Narrative   Married   Exercise: Yes   Patient lives with husband in a one story home.  Has 2 daughters.  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  Education: high school.     Right handed    Social Determinants of Health   Financial Resource Strain: Low Risk   . Difficulty of Paying Living Expenses: Not hard at all  Food Insecurity: No Food Insecurity  . Worried About Charity fundraiser in the Last Year: Never true  . Ran Out of Food in the Last Year: Never true  Transportation Needs: No Transportation Needs  . Lack of Transportation (Medical): No  . Lack of Transportation (Non-Medical): No  Physical Activity: Inactive  . Days of Exercise per Week: 0 days  . Minutes of Exercise per Session: 0 min  Stress: No Stress Concern Present  . Feeling of Stress : Not at all  Social Connections: Moderately Integrated  . Frequency of Communication with Friends and Family: More than three times a week  . Frequency of Social Gatherings with Friends and Family: More than three times a week  . Attends Religious Services: More than 4 times per year  . Active Member of Clubs or Organizations: No  . Attends Archivist Meetings: Never  . Marital  Status: Married    Tobacco Counseling Counseling given: Not Answered Comment: FORMER SMOKER 30 YEARS AGO   Clinical Intake:  Pre-visit preparation completed: Yes  Pain : No/denies pain     Nutritional Status: BMI of 19-24  Normal Nutritional Risks: None Diabetes: No  How often do you need to have someone help you when you read instructions, pamphlets, or other written materials from your doctor or pharmacy?: 1 - Never  Diabetic?No  Interpreter Needed?: No  Information entered by :: Caroleen Hamman LPN  Activities of Daily Living In your present state of health, do you have any difficulty performing the following activities: 01/10/2021 11/26/2020  Hearing? N N  Vision? N N  Difficulty concentrating or making decisions? N N  Walking or climbing stairs? N N  Dressing or bathing? N N  Doing errands, shopping? N N  Preparing Food and eating ? N -  Using the Toilet? N -  In the past six months, have you accidently leaked urine? N -  Do you have problems with loss of bowel control? N -  Managing your Medications? N -  Managing your Finances? N -  Housekeeping or managing your Housekeeping? N -  Some recent data might be hidden    Patient Care Team: Copland, Gay Filler, MD as PCP - General (Family Medicine) Buford Dresser, MD as PCP - Cardiology (Cardiology) [place], Peter M, MD (Cardiology) Roseanne Kaufman, MD (Orthopedic Surgery) Particia Nearing, MD (Dermatology) Heath Lark, MD as Consulting Physician (Hematology and Oncology) Alda Berthold, DO as Consulting Physician (Neurology)  Indicate any recent Medical Services you may have received from other than [facility] providers in the past year (date may be approximate).     Assessment:   This is a routine wellness examination for [place].  Hearing/Vision screen  Hearing Screening   _0  _1  _2  _3  _4  _5  _6  _7  _8   Right ear:           Left ear:           Comments: No  issues  Vision Screening Comments: Reading glasses Last eye exam-Scheduled for 01/2021-Dr. Eulas Post  Dietary issues and exercise activities discussed: Current Exercise Habits: The patient does not participate in regular exercise at present, Exercise limited by: orthopedic condition(s)  Goals      Patient Stated   .  "I want to get well again so I can get back to normal routine of walking for exercise" (pt-stated)      CARE PLAN ENTRY (see longitudinal plan of care for additional care plan information)  Current Barriers:  . Chronic Disease Management support and education needs related to recent hospitalization for SAH/ TBI  Nurse Case Manager Clinical Goal(s):  Marland Kitchen Over the next 30 days, patient will verbalize understanding of plan for hospital follow up appointment with PCP . Over the next 60 days, patient will work with home health team to address needs related to ongoing rehabilitation post-hospital discharge . Over the next 31 days, patient will not experience hospital admission. Hospital Admissions in last 6 months = 1  Interventions:  . Inter-disciplinary care team collaboration (see longitudinal plan of care) . Evaluation of current treatment plan related to post-hospital discharge instructions and patient's adherence to plan as established by provider . Provided education to patient and/or caregiver about advanced directives; confirmed that she has information to begin completing same . Discussed plans with patient for ongoing care management follow up and provided patient with direct contact information for care management team . Discussed current clinical condition and reviewed upcoming provider appointments- encouraged patient to ensure that PCP appointment is promptly scheduled . SDOH assess ment completed- no concerns identified . Discussed importance of medication review at time of next outreach; confirmed patient self-managing medications using pill box and has reviewed  post-SNF discharge instructions around medications; encouraged medication review at time of next scheduled outreach  Patient Self Care Activities:  . Patient verbalizes understanding of plan to actively participate in home health services as ordered post- SNF discharge . Self administers  medications as prescribed . Attends all scheduled provider appointments . Calls pharmacy for medication refills . Performs ADL's independently . Performs IADL's independently . Calls provider office for new concerns or questions  . No self-care deficits noted  Initial goal documentation 05/15/20  [name], RN, BSN, [facility]  [phone]        Other   .  Maintain healthy active lifestyle.      Depression Screen PHQ 2/9 Scores 01/10/2021 05/15/2020 11/14/2019 11/11/2018 08/27/2016 02/28/2016 10/30/2015  PHQ - 2 Score 1 0 0 0 0 0 0    Fall Risk Fall Risk  01/10/2021 12/13/2020 06/15/2020 11/14/2019 11/11/2018  Falls in the past year? _0 0  Comment - - - - -  Number falls in past yr: 0 1 0 0 -  Injury with Fall? _1 -  Risk for fall due to : History of fall(s) - - - -  Follow up Falls prevention discussed - - Education provided;Falls prevention discussed -    FALL RISK PREVENTION PERTAINING TO THE HOME:  Any stairs in or around the home? Yes  If so, are there any without handrails? No  Home free of loose throw rugs in walkways, pet beds, electrical cords, etc? Yes  Adequate lighting in your home to reduce risk of falls? Yes   ASSISTIVE DEVICES UTILIZED TO PREVENT FALLS:  Life alert? No  Use of a cane, walker or w/c? No  Grab bars in the bathroom? Yes  Shower chair or bench in shower? No  Elevated toilet seat or a handicapped toilet? No   TIMED UP AND GO:  Was the test performed? Yes . phone visit   Cognitive Function:Normal cognitive status assessed by  this Nurse Health Advisor. No abnormalities found.           Immunizations Immunization History  Administered Date(s) Administered  . Fluad Quad(high Dose 65+) 07/04/2019, 07/26/2020  . Influenza Split 06/22/2012  . Influenza, High Dose Seasonal PF 07/15/2016, 08/17/2017, 07/14/2018  . Influenza,inj,Quad PF,6+ Mos 07/13/2013, 07/13/2014, 07/16/2015  . Janssen (J&J) SARS-COV-2 Vaccination 01/16/2020  . PFIZER(Purple Top)SARS-COV-2 Vaccination 09/09/2020  . Pneumococcal Conjugate-13 05/30/2014  . Pneumococcal-Unspecified 05/13/2004  . Td 03/02/2017  . Zoster Recombinat (Shingrix) 08/03/2018, 10/02/2018    TDAP status: Up to date  Flu Vaccine status: Up to date  Pneumococcal vaccine status: Up to date  Covid-19 vaccine status: Completed vaccines  Qualifies for Shingles Vaccine? Yes   Zostavax completed No   Shingrix Completed?: Yes  Screening Tests Health Maintenance  Topic Date Due  . TETANUS/TDAP  03/03/2027  . INFLUENZA VACCINE  Completed  . DEXA SCAN  Completed  . COVID-19 Vaccine  Completed  . PNA vac Low Risk Adult  Completed  . HPV VACCINES  Aged Out    Health Maintenance  There are no preventive care reminders to display for this patient.  Colorectal cancer screening: No longer required.   Mammogram status: Completed Bilateral 08/31/2020. Repeat every year  Bone Density status:Due-Declined today  Lung Cancer Screening: (Low Dose CT Chest recommended if Age 33-80 years, 30 pack-year currently smoking OR have quit w/in 15years.) does not qualify.     Additional Screening:  Hepatitis C Screening: does not qualify  Vision Screening: Recommended annual ophthalmology exams for early detection of glaucoma and other disorders of the eye. Is the patient up to date with their annual eye exam?  No  Who is the provider  or what is the name of the office in which the patient attends annual eye exams? Dr. Tammi Sou has an appt scheduled for April.   Dental Screening: Recommended annual dental exams for proper oral  hygiene  Community Resource Referral / Chronic Care Management: CRR required this visit?  No   CCM required this visit?  No      Plan:     I have personally reviewed and noted the following in the patient's chart:   . Medical and social history . Use of alcohol, tobacco or illicit drugs  . Current medications and supplements . Functional ability and status . Nutritional status . Physical activity . Advanced directives . List of other physicians . Hospitalizations, surgeries, and ER visits in previous 12 months . Vitals . Screenings to include cognitive, depression, and falls . Referrals and appointments  In addition, I have reviewed and discussed with patient certain preventive protocols, quality metrics, and best practice recommendations. A written personalized care plan for preventive services as well as general preventive health recommendations were provided to patient.   Due to this being a telephonic visit, the after visit summary with patients personalized plan was offered to patient via mail or my-chart.  Patient would like to access on my-chart.   Marta Antu, LPN   3/38/2505  Nurse Health Advisor  Nurse Notes: None

## 2021-01-10 NOTE — Patient Instructions (Signed)
[name] , Thank you for taking time to complete your Medicare Wellness Visit. I appreciate your ongoing commitment to your health goals. Please review the following plan we discussed and let me know if I can assist you in the future.   Screening recommendations/referrals: Colonoscopy: No longer required Mammogram: Completed 08/31/2020-Due 08/31/2022 Bone Density: Due-Declined today.  Recommended yearly ophthalmology/optometry visit for glaucoma screening and checkup Recommended yearly dental visit for hygiene and checkup  Vaccinations: Influenza vaccine: Up to date Pneumococcal vaccine: Completed vaccines Tdap vaccine: Up to date-Due-03/03/2027 Shingles vaccine: Completed vaccines   Covid-19:Completed vaccines  Advanced directives: Please bring a copy for your chart when completed.  Conditions/risks identified: See problem list  Next appointment: Follow up in one year for your annual wellness visit 01/16/2022 @ 10:20am   Preventive Care 65 Years and Older, Female Preventive care refers to lifestyle choices and visits with your health care provider that can promote health and wellness. What does preventive care include?  A yearly physical exam. This is also called an annual well check.  Dental exams once or twice a year.  Routine eye exams. Ask your health care provider how often you should have your eyes checked.  Personal lifestyle choices, including:  Daily care of your teeth and gums.  Regular physical activity.  Eating a healthy diet.  Avoiding tobacco and drug use.  Limiting alcohol use.  Practicing safe sex.  Taking low-dose aspirin every day.  Taking vitamin and mineral supplements as recommended by your health care provider. What happens during an annual well check? The services and screenings done by your health care provider during your annual well check will depend on your age, overall health, lifestyle risk factors, and family history of  disease. Counseling  Your health care provider may ask you questions about your:  Alcohol use.  Tobacco use.  Drug use.  Emotional well-being.  Home and relationship well-being.  Sexual activity.  Eating habits.  History of falls.  Memory and ability to understand (cognition).  Work and work Statistician.  Reproductive health. Screening  You may have the following tests or measurements:  Height, weight, and BMI.  Blood pressure.  Lipid and cholesterol levels. These may be checked every 5 years, or more frequently if you are over 54 years old.  Skin check.  Lung cancer screening. You may have this screening every year starting at age 85 if you have a 30-pack-year history of smoking and currently smoke or have quit within the past 15 years.  Fecal occult blood test (FOBT) of the stool. You may have this test every year starting at age 85.  Flexible sigmoidoscopy or colonoscopy. You may have a sigmoidoscopy every 5 years or a colonoscopy every 10 years starting at age 85.  Hepatitis C blood test.  Hepatitis B blood test.  Sexually transmitted disease (STD) testing.  Diabetes screening. This is done by checking your blood sugar (glucose) after you have not eaten for a while (fasting). You may have this done every 1-3 years.  Bone density scan. This is done to screen for osteoporosis. You may have this done starting at age 40.  Mammogram. This may be done every 1-2 years. Talk to your health care provider about how often you should have regular mammograms. Talk with your health care provider about your test results, treatment options, and if necessary, the need for more tests. Vaccines  Your health care provider may recommend certain vaccines, such as:  Influenza vaccine. This is recommended every year.  Tetanus, diphtheria, and acellular pertussis (Tdap, Td) vaccine. You may need a Td booster every 10 years.  Zoster vaccine. You may need this after age  85.  Pneumococcal 13-valent conjugate (PCV13) vaccine. One dose is recommended after age 85.  Pneumococcal polysaccharide (PPSV23) vaccine. One dose is recommended after age 85. Talk to your health care provider about which screenings and vaccines you need and how often you need them. This information is not intended to replace advice given to you by your health care provider. Make sure you discuss any questions you have with your health care provider. Document Released: 10/26/2015 Document Revised: 06/18/2016 Document Reviewed: 07/31/2015 Elsevier Interactive Patient Education  2017 [employer] Prevention in the Home Falls can cause injuries. They can happen to people of all ages. There are many things you can do to make your home safe and to help prevent falls. What can I do on the outside of my home?  Regularly fix the edges of walkways and driveways and fix any cracks.  Remove anything that might make you trip as you walk through a door, such as a raised step or threshold.  Trim any bushes or trees on the path to your home.  Use bright outdoor lighting.  Clear any walking paths of anything that might make someone trip, such as rocks or tools.  Regularly check to see if handrails are loose or broken. Make sure that both sides of any steps have handrails.  Any raised decks and porches should have guardrails on the edges.  Have any leaves, snow, or ice cleared regularly.  Use sand or salt on walking paths during winter.  Clean up any spills in your garage right away. This includes oil or grease spills. What can I do in the bathroom?  Use night lights.  Install grab bars by the toilet and in the tub and shower. Do not use towel bars as grab bars.  Use non-skid mats or decals in the tub or shower.  If you need to sit down in the shower, use a plastic, non-slip stool.  Keep the floor dry. Clean up any water that spills on the floor as soon as it happens.  Remove  soap buildup in the tub or shower regularly.  Attach bath mats securely with double-sided non-slip rug tape.  Do not have throw rugs and other things on the floor that can make you trip. What can I do in the bedroom?  Use night lights.  Make sure that you have a light by your bed that is easy to reach.  Do not use any sheets or blankets that are too big for your bed. They should not hang down onto the floor.  Have a firm chair that has side arms. You can use this for support while you get dressed.  Do not have throw rugs and other things on the floor that can make you trip. What can I do in the kitchen?  Clean up any spills right away.  Avoid walking on wet floors.  Keep items that you use a lot in easy-to-reach places.  If you need to reach something above you, use a strong step stool that has a grab bar.  Keep electrical cords out of the way.  Do not use floor polish or wax that makes floors slippery. If you must use wax, use non-skid floor wax.  Do not have throw rugs and other things on the floor that can make you trip. What can I do  with my stairs?  Do not leave any items on the stairs.  Make sure that there are handrails on both sides of the stairs and use them. Fix handrails that are broken or loose. Make sure that handrails are as long as the stairways.  Check any carpeting to make sure that it is firmly attached to the stairs. Fix any carpet that is loose or worn.  Avoid having throw rugs at the top or bottom of the stairs. If you do have throw rugs, attach them to the floor with carpet tape.  Make sure that you have a light switch at the top of the stairs and the bottom of the stairs. If you do not have them, ask someone to add them for you. What else can I do to help prevent falls?  Wear shoes that:  Do not have high heels.  Have rubber bottoms.  Are comfortable and fit you well.  Are closed at the toe. Do not wear sandals.  If you use a  stepladder:  Make sure that it is fully opened. Do not climb a closed stepladder.  Make sure that both sides of the stepladder are locked into place.  Ask someone to hold it for you, if possible.  Clearly mark and make sure that you can see:  Any grab bars or handrails.  First and last steps.  Where the edge of each step is.  Use tools that help you move around (mobility aids) if they are needed. These include:  Canes.  Walkers.  Scooters.  Crutches.  Turn on the lights when you go into a dark area. Replace any light bulbs as soon as they burn out.  Set up your furniture so you have a clear path. Avoid moving your furniture around.  If any of your floors are uneven, fix them.  If there are any pets around you, be aware of where they are.  Review your medicines with your doctor. Some medicines can make you feel dizzy. This can increase your chance of falling. Ask your doctor what other things that you can do to help prevent falls. This information is not intended to replace advice given to you by your health care provider. Make sure you discuss any questions you have with your health care provider. Document Released: 07/26/2009 Document Revised: 03/06/2016 Document Reviewed: 11/03/2014 Elsevier Interactive Patient Education  2017 [employer].

## 2021-01-12 ENCOUNTER — Other Ambulatory Visit: Payer: Medicare Other

## 2021-01-14 ENCOUNTER — Other Ambulatory Visit: Payer: Self-pay

## 2021-01-14 ENCOUNTER — Encounter: Payer: Self-pay | Admitting: Family Medicine

## 2021-01-14 ENCOUNTER — Other Ambulatory Visit (INDEPENDENT_AMBULATORY_CARE_PROVIDER_SITE_OTHER): Payer: Medicare Other

## 2021-01-14 DIAGNOSIS — E039 Hypothyroidism, unspecified: Secondary | ICD-10-CM

## 2021-01-14 LAB — TSH: TSH: 1.63 u[IU]/mL (ref 0.35–4.50)

## 2021-01-21 DIAGNOSIS — Z85828 Personal history of other malignant neoplasm of skin: Secondary | ICD-10-CM | POA: Diagnosis not present

## 2021-01-21 DIAGNOSIS — L570 Actinic keratosis: Secondary | ICD-10-CM | POA: Diagnosis not present

## 2021-01-21 DIAGNOSIS — L821 Other seborrheic keratosis: Secondary | ICD-10-CM | POA: Diagnosis not present

## 2021-01-21 DIAGNOSIS — C44519 Basal cell carcinoma of skin of other part of trunk: Secondary | ICD-10-CM | POA: Diagnosis not present

## 2021-01-21 DIAGNOSIS — C44719 Basal cell carcinoma of skin of left lower limb, including hip: Secondary | ICD-10-CM | POA: Diagnosis not present

## 2021-01-21 DIAGNOSIS — D485 Neoplasm of uncertain behavior of skin: Secondary | ICD-10-CM | POA: Diagnosis not present

## 2021-01-22 NOTE — Progress Notes (Addendum)
[facility] at [facility] [address] [place], [place] [address] [phone]  Date:  01/24/2021   Name:  [name]   DOB:  15-Sep-1931   MRN:  [id number]  PCP:  Darreld Mclean, MD    Chief Complaint: 6 month follow up and Bloated (Discomfort, bloating in upper abdomen )   History of Present Illness:  [name] is a 87 y.o. very pleasant female patient who presents with the following:  Here today for a 6 month follow-up visit and to check on a skin tear from recent IV placement.  This has healed fully Last seen by myself about 6 weeks ago - history of HTN, HLD, hypothyroidism, breast cancer   [name] fell in August 2021, she sustained an orbital fracture and subarachnoid hemorrhage, as well as a thoracic compression fracture  She underwent surgery in February for kyphoplasty T12 and L1 per Dr. Rolena Infante She is to start PT for her back here at the [facility] She is still having some pain in her back, though she does feel that she is overall better.  Lab Results  Component Value Date   TSH 1.63 01/14/2021   She is here today with concern of abd bloating-noted since  January She feels like her belly is larger than is normal and it seems to "come up" when she is seated-sometimes she feels like her belly is so large and makes it hard to breathe when she is sitting up  Wt Readings from Last 3 Encounters:  01/24/21 110 lb (49.9 kg)  01/10/21 110 lb (49.9 kg)  12/31/20 110 lb (49.9 kg)   She feels like her weight is about the same- perhaps up 2-3 lbs from her usual baseline  She is not back to her normal walking routine- she is still active for age but not doing her long walks like she did in the past   No abd pain She is not having constipation- she has normal daily AM bowel movements  Her appetite is normal, no vomiting or diarrhea  Most recent colon 2013 Her back pain is much better- she is continuing to see Dr Rolena Infante  on a prn basis now  Patient Active Problem List   Diagnosis Date Noted  . Chronic midline thoracic back pain 12/31/2020  . Cellulitis of skin 12/31/2020  . Paroxysmal SVT (supraventricular tachycardia) ([place]) 12/23/2020  . Intracranial hemorrhage ([place])   . Acute blood loss anemia   . TBI (traumatic brain injury) ([place]) 05/04/2020  . Back pain 05/04/2020  . SAH (subarachnoid hemorrhage) ([place]) 04/29/2020  . Fall at home, initial encounter 04/29/2020  . Orbital fracture, closed, initial encounter ([place]) 04/29/2020  . Basal cell carcinoma (BCC) in situ of skin 04/04/2020  . Essential hypertension 03/21/2019  . Stage 3a chronic kidney disease   . Systolic murmur 27/78/2423  . Hypercalcemia   . AKI (acute kidney injury) ([place])   . Dizziness 02/22/2019  . Squamous cell carcinoma in situ of skin 11/30/2018  . Diarrhea 09/08/2017  . Nausea vomiting and diarrhea 09/08/2017  . Elevated serum creatinine 09/08/2017  . PVC (premature ventricular contraction) 09/08/2017  . Glaucoma 09/08/2017  . Hx of meningioma of the brain 08/28/2017  . Hoarseness of voice 02/28/2016  . Raynaud's phenomenon 02/28/2016  . Esophageal spasm 09/20/2013  . Osteoporosis 03/18/2012  . Fracture, shoulder 03/06/2012  . Hyperlipidemia   . Breast cancer [facility])     Past Medical History:  Diagnosis Date  . Autoimmune disease ([place])   . Barrett's esophagus   . Bone lesion    sacrum  . Breast cancer ([place]) 2010   cT1 N0 M0; ER+/ PR+/ HER2 neg; s/p lumpectomy 07/2010; started adjuvant hormonal therapy 10/2009  . Cervical cancer ([place])   . Dysphagia   . Fx distal radius NEC-closed   . Glaucoma   . Humerus fracture 03/2012   impacted left humueral neck fracture, treated by Dr. Amedeo Plenty  . Hyperlipidemia   . Hypertension   . Hypothyroidism   . Osteoporosis 03/18/2012  . Pneumonia   . Thyroid disease     Past Surgical History:  Procedure Laterality Date  . ABDOMINAL HYSTERECTOMY  1972   cancer          1 tube 1 ovary   . APPENDECTOMY    . BACK SURGERY  11/28/2020  . BREAST LUMPECTOMY Left 2010  . colon polyp removal    . EYE SURGERY    . HAND SURGERY    . KYPHOPLASTY N/A 11/28/2020   Procedure: KYPHOPLASTY THORACIC TWELVE AND LUMBAR ONE;  Surgeon: Melina Schools, MD;  Location: [facility];  Service: Orthopedics;  Laterality: N/A;  90 mins  . ortho procedures     multiple  . OVARIAN CYST REMOVAL  1978  . SHOULDER SURGERY  1954   left    Social History   Tobacco Use  . Smoking status: Former Smoker    Quit date: 12/16/1986    Years since quitting: 34.1  . Smokeless tobacco: Never Used  . Tobacco comment: FORMER SMOKER 30 YEARS AGO  Vaping Use  . Vaping Use: Never used  Substance Use Topics  . Alcohol use: Yes    Alcohol/week: 0.0 standard drinks    Comment: OCCASIONALLY  . Drug use: No    Family History  Problem Relation Age of Onset  . Heart disease Father   . Cancer Sister        lung, kidney  . Cancer Brother        brain  . Cancer Paternal Aunt        breast  . Breast cancer Paternal Aunt   . Cancer Brother        liver, lung  . Osteoporosis Neg Hx     Allergies  Allergen Reactions  . Daypro [Oxaprozin] Swelling    Face and tongue  . Prolia [Denosumab]     unknown    Medication list has been reviewed and updated.  Current Outpatient Medications on File Prior to Visit  Medication Sig Dispense Refill  . amLODipine (NORVASC) 2.5 MG tablet Take 2.5 mg by mouth daily.    . Calcium Carbonate-Vitamin D (CALCIUM + D PO) Take 1 capsule by mouth daily.     Marland Kitchen latanoprost (XALATAN) 0.005 % ophthalmic solution Place 1 drop into both eyes at bedtime.     Marland Kitchen levothyroxine (SYNTHROID) 75 MCG tablet TAKE 1 TABLET BY MOUTH DAILY BEFORE BREAKFAST 30 tablet 3  . Multiple Vitamin (MULTIVITAMIN WITH MINERALS) TABS tablet Take 1 tablet by mouth daily. One a day    . rosuvastatin (CRESTOR) 10 MG tablet Take 1 tablet (10 mg total) by mouth daily. TAKE 1 BY MOUTH DAILY (Patient taking differently:  Take 10 mg by mouth daily.) 90 tablet 1   No current facility-administered medications on file prior to visit.    Review of Systems:  As per HPI- otherwise negative.   Physical Examination: Vitals:   01/24/21 0918  BP:  132/76  Pulse: 84  Resp: 18  Temp: 97.9 F (36.6 C)  SpO2: 94%   Vitals:   01/24/21 0918  Weight: 110 lb (49.9 kg)  Height: $Remove'5\' 3"'NRsArEF$  (1.6 m)   Body mass index is 19.49 kg/m. Ideal Body Weight: Weight in (lb) to have BMI = 25: 140.8  GEN: no acute distress. Petite build, looks well and spry for age  76: Atraumatic, Normocephalic.  Ears and Nose: No external deformity. CV: RRR, No M/G/R. No JVD. No thrill. No extra heart sounds. PULM: CTA B, no wheezes, crackles, rhonchi. No retractions. No resp. distress. No accessory muscle use. ABD: S, NT, ND, +BS. No rebound. No HSM.  No abdominal masses are appreciated EXTR: No c/c/e PSYCH: Normally interactive. Conversant.  Recent visit with derm - pt had some cryotherapy on her arms which is healing up ok   Assessment and Plan: Abdominal bloating - Plan: CBC, Comprehensive metabolic panel, CT Abdomen Pelvis W Contrast  Abdominal distension (gaseous) - Plan: CT Abdomen Pelvis W Contrast  Patient today with concern of abdominal distention which she has noted for 3 to 4 months.  This is unusual for her, she does like her belly is changed from her normal She does have history of cervical cancer and breast cancer Discussed with patient in detail.  While certainly this may be a benign entity, something like a mass is also possible especially given her history of cancer.  She would like to go ahead and proceed with CT scan Ordered labs as above, order CT Will plan further follow- up pending labs. She will contact me if any worsening in the meantime This visit occurred during the SARS-CoV-2 public health emergency.  Safety protocols were in place, including screening questions prior to the visit, additional usage of staff  PPE, and extensive cleaning of exam room while observing appropriate contact time as indicated for disinfecting solutions.    Signed Lamar Blinks, MD   Received her labs as below 4/14-  Results for orders placed or performed in visit on 01/24/21  CBC  Result Value Ref Range   WBC 8.8 4.0 - 10.5 K/uL   RBC 4.08 3.87 - 5.11 Mil/uL   Platelets 254.0 150.0 - 400.0 K/uL   Hemoglobin 12.3 12.0 - 15.0 g/dL   HCT 37.6 36.0 - 46.0 %   MCV 92.3 78.0 - 100.0 fl   MCHC 32.7 30.0 - 36.0 g/dL   RDW 16.5 (H) 11.5 - 15.5 %  Comprehensive metabolic panel  Result Value Ref Range   Sodium 138 135 - 145 mEq/L   Potassium 5.1 3.5 - 5.1 mEq/L   Chloride 102 96 - 112 mEq/L   CO2 30 19 - 32 mEq/L   Glucose, Bld 92 70 - 99 mg/dL   BUN 23 6 - 23 mg/dL   Creatinine, Ser 1.06 0.40 - 1.20 mg/dL   Total Bilirubin 0.4 0.2 - 1.2 mg/dL   Alkaline Phosphatase 92 39 - 117 U/L   AST 24 0 - 37 U/L   ALT 19 0 - 35 U/L   Total Protein 6.8 6.0 - 8.3 g/dL   Albumin 3.9 3.5 - 5.2 g/dL   GFR 47.14 (L) >60.00 mL/min   Calcium 10.0 8.4 - 10.5 mg/dL   Message to pt Mild decrease GFR

## 2021-01-24 ENCOUNTER — Encounter: Payer: Self-pay | Admitting: Family Medicine

## 2021-01-24 ENCOUNTER — Other Ambulatory Visit: Payer: Self-pay

## 2021-01-24 ENCOUNTER — Ambulatory Visit (INDEPENDENT_AMBULATORY_CARE_PROVIDER_SITE_OTHER): Payer: Medicare Other | Admitting: Family Medicine

## 2021-01-24 VITALS — BP 132/76 | HR 84 | Temp 97.9°F | Resp 18 | Ht 63.0 in | Wt 110.0 lb

## 2021-01-24 DIAGNOSIS — R140 Abdominal distension (gaseous): Secondary | ICD-10-CM | POA: Diagnosis not present

## 2021-01-24 LAB — COMPREHENSIVE METABOLIC PANEL
ALT: 19 U/L (ref 0–35)
AST: 24 U/L (ref 0–37)
Albumin: 3.9 g/dL (ref 3.5–5.2)
Alkaline Phosphatase: 92 U/L (ref 39–117)
BUN: 23 mg/dL (ref 6–23)
CO2: 30 mEq/L (ref 19–32)
Calcium: 10 mg/dL (ref 8.4–10.5)
Chloride: 102 mEq/L (ref 96–112)
Creatinine, Ser: 1.06 mg/dL (ref 0.40–1.20)
GFR: 47.14 mL/min — ABNORMAL LOW (ref >60.00)
Glucose, Bld: 92 mg/dL (ref 70–99)
Potassium: 5.1 mEq/L (ref 3.5–5.1)
Sodium: 138 mEq/L (ref 135–145)
Total Bilirubin: 0.4 mg/dL (ref 0.2–1.2)
Total Protein: 6.8 g/dL (ref 6.0–8.3)

## 2021-01-24 LAB — CBC
HCT: 37.6 % (ref 36.0–46.0)
Hemoglobin: 12.3 g/dL (ref 12.0–15.0)
MCHC: 32.7 g/dL (ref 30.0–36.0)
MCV: 92.3 fl (ref 78.0–100.0)
Platelets: 254 K/uL (ref 150.0–400.0)
RBC: 4.08 Mil/uL (ref 3.87–5.11)
RDW: 16.5 % — ABNORMAL HIGH (ref 11.5–15.5)
WBC: 8.8 K/uL (ref 4.0–10.5)

## 2021-01-24 NOTE — Patient Instructions (Signed)
Good to see you again today!  I will be in touch with your labs asap We will plan to get a CT scan of your belly to make sure all is well.  Please stop by the imaging dept on the ground floor today and schedule this study  Otherwise we can plan to visit in 6 months

## 2021-01-30 ENCOUNTER — Telehealth: Payer: Self-pay | Admitting: Family Medicine

## 2021-01-30 ENCOUNTER — Ambulatory Visit (HOSPITAL_BASED_OUTPATIENT_CLINIC_OR_DEPARTMENT_OTHER)
Admission: RE | Admit: 2021-01-30 | Discharge: 2021-01-30 | Disposition: A | Discharge status: Home / Self Care | Payer: Medicare Other | Source: Ambulatory Visit | Attending: Family Medicine | Admitting: Family Medicine

## 2021-01-30 ENCOUNTER — Other Ambulatory Visit: Payer: Self-pay

## 2021-01-30 ENCOUNTER — Encounter: Payer: Self-pay | Admitting: Family Medicine

## 2021-01-30 DIAGNOSIS — R140 Abdominal distension (gaseous): Secondary | ICD-10-CM | POA: Diagnosis not present

## 2021-01-30 DIAGNOSIS — I700 Atherosclerosis of aorta: Secondary | ICD-10-CM | POA: Diagnosis not present

## 2021-01-30 DIAGNOSIS — K5730 Diverticulosis of large intestine without perforation or abscess without bleeding: Secondary | ICD-10-CM | POA: Insufficient documentation

## 2021-01-30 DIAGNOSIS — K760 Fatty (change of) liver, not elsewhere classified: Secondary | ICD-10-CM | POA: Insufficient documentation

## 2021-01-30 MED ORDER — IOHEXOL 300 MG/ML  SOLN
100.0000 mL | Freq: Once | INTRAMUSCULAR | Status: AC | PRN
  Administered 2021-01-30: 100 mL via INTRAVENOUS

## 2021-01-30 NOTE — Telephone Encounter (Signed)
Called pt and LMOM with CT report- will also send mychart  CT Abdomen Pelvis W Contrast  Result Date: 01/30/2021 CLINICAL DATA:  Abdominal distension and bloating for 1 year EXAM: CT ABDOMEN AND PELVIS WITH CONTRAST TECHNIQUE: Multidetector CT imaging of the abdomen and pelvis was performed using the standard protocol following bolus administration of intravenous contrast. CONTRAST:  170mL OMNIPAQUE IOHEXOL 300 MG/ML  SOLN COMPARISON:  09/08/2017 FINDINGS: Lower chest: Peripheral consolidation within the right lower lobe along the right hemidiaphragm slightly more pronounced than prior study, most consistent with scarring or atelectasis. No effusion or pneumothorax. Hepatobiliary: Mild diffuse hepatic steatosis. Stable right lobe liver cyst. No intrahepatic duct dilation. The gallbladder is unremarkable. Pancreas: Unremarkable. No pancreatic ductal dilatation or surrounding inflammatory changes. Spleen: Normal in size without focal abnormality. Adrenals/Urinary Tract: There is bilateral renal cortical thinning without focal abnormality. No urinary tract calculi or obstruction. The adrenals and bladder are stable. Stomach/Bowel: No bowel obstruction or ileus. Scattered diverticulosis of the sigmoid colon without diverticulitis. Likely transient intussusception within the proximal jejunum left upper quadrant, extending approximately 7 cm in length. A small 8 mm fatty nodule within the proximal jejunal lumen could reflect a small polyp, reference image 36/2 and 20/6. No bowel wall thickening or inflammatory change. Vascular/Lymphatic: Aortic atherosclerosis. No enlarged abdominal or pelvic lymph nodes. Reproductive: Status post hysterectomy. No adnexal masses. Other: No free fluid or free gas.  No abdominal wall hernia. Musculoskeletal: Chronic compression deformities at T11, T12, and L1, with vertebral augmentation at the T12 and L1 levels unchanged. IMPRESSION: 1. Likely transient intussusception involving the  proximal jejunum left upper quadrant. 8 mm fatty nodule within the proximal jejunal loop could reflect a polyp. No bowel obstruction. 2. Hepatic steatosis. 3. Progressive scarring or atelectasis within the right lower lobe. 4. Colonic diverticulosis without diverticulitis. 5.  Aortic Atherosclerosis (ICD10-I70.0). Electronically Signed   By: Randa Ngo M.D.   On: 01/30/2021 16:50    Transient intussusception may be cause of her pain.  Given her age, I suspect surgeon will not want to operate but will refer for a consultation.  If anything is changing or getting worse please contact me

## 2021-02-08 DIAGNOSIS — H401132 Primary open-angle glaucoma, bilateral, moderate stage: Secondary | ICD-10-CM | POA: Diagnosis not present

## 2021-02-08 DIAGNOSIS — H02052 Trichiasis without entropian right lower eyelid: Secondary | ICD-10-CM | POA: Diagnosis not present

## 2021-02-21 DIAGNOSIS — C44719 Basal cell carcinoma of skin of left lower limb, including hip: Secondary | ICD-10-CM | POA: Diagnosis not present

## 2021-02-21 DIAGNOSIS — C44519 Basal cell carcinoma of skin of other part of trunk: Secondary | ICD-10-CM | POA: Diagnosis not present

## 2021-02-21 DIAGNOSIS — Z85828 Personal history of other malignant neoplasm of skin: Secondary | ICD-10-CM | POA: Diagnosis not present

## 2021-03-04 ENCOUNTER — Other Ambulatory Visit: Payer: Self-pay

## 2021-03-04 DIAGNOSIS — E782 Mixed hyperlipidemia: Secondary | ICD-10-CM

## 2021-03-04 MED ORDER — ROSUVASTATIN CALCIUM 10 MG PO TABS: 10.0000 mg | ORAL_TABLET | Freq: Every day | ORAL | 0 refills | Status: DC

## 2021-03-12 ENCOUNTER — Telehealth: Payer: Self-pay | Admitting: Family Medicine

## 2021-03-12 DIAGNOSIS — E039 Hypothyroidism, unspecified: Secondary | ICD-10-CM

## 2021-03-12 MED ORDER — LEVOTHYROXINE SODIUM 75 MCG PO TABS: ORAL_TABLET | ORAL | 3 refills | Status: DC

## 2021-03-12 MED ORDER — LEVOTHYROXINE SODIUM 75 MCG PO TABS: ORAL_TABLET | ORAL | 1 refills | Status: DC

## 2021-03-12 NOTE — Telephone Encounter (Signed)
Pt states that her rx levothyroxine was sent to the wrong location it was sent to [facility] and patient states she gets her medication through [facility] and Dr. Lorelei Pont change the dosage on it. Please send to correct location. Pt states [facility] will also need the rx for it.

## 2021-03-12 NOTE — Telephone Encounter (Signed)
Medication sent to pharmacy for patient.

## 2021-03-12 NOTE — Telephone Encounter (Signed)
Re-sent to pharmacy.

## 2021-03-12 NOTE — Telephone Encounter (Signed)
[name] [facility] SERVICE) [place], [place] Phone:  [phone]  Fax:  [phone]     levothyroxine (SYNTHROID) 75 MCG tab 0.05 50 MCG's Is that the pharm send refill on. However she should be on  75 mcg. She was told by Pharmacy to call us for a new script.  Patient is requsting for a 90 day refill

## 2021-04-16 ENCOUNTER — Ambulatory Visit (INDEPENDENT_AMBULATORY_CARE_PROVIDER_SITE_OTHER): Payer: Medicare Other | Admitting: Internal Medicine

## 2021-04-16 ENCOUNTER — Encounter: Payer: Self-pay | Admitting: Internal Medicine

## 2021-04-16 ENCOUNTER — Other Ambulatory Visit: Payer: Self-pay

## 2021-04-16 VITALS — BP 132/80 | HR 71 | Temp 98.1°F | Resp 18 | Ht 63.0 in | Wt 109.4 lb

## 2021-04-16 DIAGNOSIS — R140 Abdominal distension (gaseous): Secondary | ICD-10-CM | POA: Diagnosis not present

## 2021-04-16 DIAGNOSIS — R1013 Epigastric pain: Secondary | ICD-10-CM

## 2021-04-16 NOTE — Progress Notes (Addendum)
Subjective:    Patient ID: [name], female    DOB: 1931-10-02, 87 y.o.   MRN: [id number]  DOS:  04/16/2021 Type of visit - description: Acute  The patient main complaint today is feeling bloated and swelling of the upper abdomen. She reports this is going on for a while. She thinks sxs could be related to: - starting levothyroxine in 2019 or increasing the dose of thyroid medication 12/2020 ?Marland Kitchen - She also thinks could be related to her back, she had a major fall last year.  She was already evaluated by PCP with the same symptoms on April, work-up including a CT of the abdomen labs.  CBC and LFTs were normal.  See CT report below.  They mention possible transcends intussusception of the proximal jejunum, however her symptoms are not episodic there basically constant with worsening at night.  01-30-21 CT IMPRESSION: 1. Likely transient intussusception involving the proximal jejunum left upper quadrant. 8 mm fatty nodule within the proximal jejunal loop could reflect a polyp. No bowel obstruction. 2. Hepatic steatosis. 3. Progressive scarring or atelectasis within the right lower lobe. 4. Colonic diverticulosis without diverticulitis. 5.  Aortic Atherosclerosis (ICD10-I70.0).   Wt Readings from Last 3 Encounters:  04/16/21 109 lb 6 oz (49.6 kg)  01/24/21 110 lb (49.9 kg)  01/10/21 110 lb (49.9 kg)     Review of Systems No fever chills no weight loss. No nausea, vomiting, diarrhea.  No change in the color of the stools or blood in the stools. No GERD Occasional dysphagia, she needs to eat slow to prevent choking.  This has been a chronic issue. No constipation, BMs are daily. She takes ibuprofen 400 mg sporadically.   Past Medical History:  Diagnosis Date   Autoimmune disease ([place])    Barrett's esophagus    Bone lesion    sacrum   Breast cancer ([place]) 2010   cT1 N0 M0; ER+/ PR+/ HER2 neg; s/p lumpectomy 07/2010; started adjuvant hormonal therapy 10/2009   Cervical cancer  ([place])    Dysphagia    Fx distal radius NEC-closed    Glaucoma    Humerus fracture 03/2012   impacted left humueral neck fracture, treated by Dr. Amedeo Plenty   Hyperlipidemia    Hypertension    Hypothyroidism    Osteoporosis 03/18/2012   Pneumonia    Thyroid disease     Past Surgical History:  Procedure Laterality Date   ABDOMINAL HYSTERECTOMY  1972   cancer          1 tube 1 ovary   APPENDECTOMY     BACK SURGERY  11/28/2020   BREAST LUMPECTOMY Left 2010   colon polyp removal     EYE SURGERY     HAND SURGERY     KYPHOPLASTY N/A 11/28/2020   Procedure: KYPHOPLASTY THORACIC TWELVE AND LUMBAR ONE;  Surgeon: Melina Schools, MD;  Location: [facility];  Service: Orthopedics;  Laterality: N/A;  90 mins   ortho procedures     multiple   OVARIAN CYST REMOVAL  [place]   left    Allergies as of 04/16/2021       Reactions   Daypro [oxaprozin] Swelling   Face and tongue   Prolia [denosumab]    unknown        Medication List        Accurate as of April 16, 2021 11:59 PM. If you have any questions, ask your nurse or doctor.  amLODipine 2.5 MG tablet Commonly known as: NORVASC Take 2.5 mg by mouth daily.   CALCIUM + D PO Take 1 capsule by mouth daily.   latanoprost 0.005 % ophthalmic solution Commonly known as: XALATAN Place 1 drop into both eyes at bedtime.   levothyroxine 75 MCG tablet Commonly known as: SYNTHROID TAKE 1 TABLET BY MOUTH DAILY BEFORE BREAKFAST   multivitamin with minerals Tabs tablet Take 1 tablet by mouth daily. One a day   rosuvastatin 10 MG tablet Commonly known as: Crestor Take 1 tablet (10 mg total) by mouth daily. TAKE 1 BY MOUTH DAILY           Objective:   Physical Exam BP 132/80 (BP Location: Left Arm, Patient Position: Sitting, Cuff Size: Small)   Pulse 71   Temp 98.1 F (36.7 C) (Oral)   Resp 18   Ht _0  (1.6 m)   Wt 109 lb 6 oz (49.6 kg)   SpO2 96%   BMI 19.37 kg/m  General:   Well developed, NAD,  BMI noted.  HEENT:  Normocephalic . Face symmetric, atraumatic Lungs:  CTA B Normal respiratory effort, no intercostal retractions, no accessory muscle use. Heart: RRR,  no murmur.  Abdomen:  Not distended, soft, non-tender. No rebound or rigidity.  + Palpable nontender aorta Skin: Not pale. Not jaundice Lower extremities: no pretibial edema bilaterally  Neurologic:  alert & oriented X3.  Speech normal, gait appropriate for age and unassisted Psych--  Cognition and judgment appear intact.  Cooperative with normal attention span and concentration.  Behavior appropriate. No anxious or depressed appearing.     Assessment     87 year old female, PMH includes HTN, paroxysmal SVT, PVCs, esophageal spasm, SAH, BCC, osteoporosis, CKD, breast cancer, presents with:  Dyspepsia: Chronic upper abdominal bloating as described above. Chart is reviewed: Had a colonoscopy/EGD  2013, few polyps, short Barrett's segment, some angiodysplastic areas as well.  No follow-up EGD due to age according to report. Seen by PCP April 2022 with same symptoms, CT abd and labs were okay, see HPI. (CT report question of jejunal intussusception but pain is more steady rather than episodic/severe.  No associated nausea). Etiology unclear, could be GI , plan: Ultrasound of the abdomen, further advised with results.   Consider to ask Ortho to see again, she has chronic back pain, occasionally a thoracic radiculopathy may mimic GI symptoms.  Time spent 30 minutes, listening to symptoms description, listening to patient's concerns, reviewing the chart.  This visit occurred during the SARS-CoV-2 public health emergency.  Safety protocols were in place, including screening questions prior to the visit, additional usage of staff PPE, and extensive cleaning of exam room while observing appropriate contact time as indicated for disinfecting solutions.

## 2021-04-16 NOTE — Patient Instructions (Signed)
Please go to the first floor and schedule a ultrasound of your abdomen

## 2021-04-18 ENCOUNTER — Ambulatory Visit (HOSPITAL_BASED_OUTPATIENT_CLINIC_OR_DEPARTMENT_OTHER)
Admission: RE | Admit: 2021-04-18 | Discharge: 2021-04-18 | Disposition: A | Discharge status: Home / Self Care | Payer: Medicare Other | Source: Ambulatory Visit | Attending: Internal Medicine | Admitting: Internal Medicine

## 2021-04-18 ENCOUNTER — Other Ambulatory Visit: Payer: Self-pay

## 2021-04-18 DIAGNOSIS — R140 Abdominal distension (gaseous): Secondary | ICD-10-CM | POA: Insufficient documentation

## 2021-04-18 DIAGNOSIS — R1013 Epigastric pain: Secondary | ICD-10-CM | POA: Diagnosis not present

## 2021-04-18 DIAGNOSIS — N261 Atrophy of kidney (terminal): Secondary | ICD-10-CM | POA: Diagnosis not present

## 2021-04-19 NOTE — Addendum Note (Signed)
Addended byDamita Dunnings D on: 04/19/2021 09:43 AM   Modules accepted: Orders

## 2021-05-27 ENCOUNTER — Telehealth: Payer: Self-pay

## 2021-05-27 DIAGNOSIS — E782 Mixed hyperlipidemia: Secondary | ICD-10-CM

## 2021-05-27 MED ORDER — AMLODIPINE BESYLATE 2.5 MG PO TABS: 2.5000 mg | ORAL_TABLET | Freq: Every day | ORAL | 3 refills | Status: DC

## 2021-05-27 MED ORDER — ROSUVASTATIN CALCIUM 10 MG PO TABS: 10.0000 mg | ORAL_TABLET | Freq: Every day | ORAL | 3 refills | Status: DC

## 2021-05-27 NOTE — Telephone Encounter (Signed)
Pt called requesting refills on amlodipine and rosuvastatin.  I advised pt Dr. Lorelei Pont had not been the prescriber (it states historical provider) for the amlodipine and she stated that could have been changed up when she was in the hospital last year with a brain and back injury.  Pt requesting refills be sent to [facility]-mail order pharmacy.

## 2021-06-07 DIAGNOSIS — D23122 Other benign neoplasm of skin of left lower eyelid, including canthus: Secondary | ICD-10-CM | POA: Diagnosis not present

## 2021-06-07 DIAGNOSIS — H401132 Primary open-angle glaucoma, bilateral, moderate stage: Secondary | ICD-10-CM | POA: Diagnosis not present

## 2021-06-07 DIAGNOSIS — D23111 Other benign neoplasm of skin of right upper eyelid, including canthus: Secondary | ICD-10-CM | POA: Diagnosis not present

## 2021-06-07 DIAGNOSIS — H0102A Squamous blepharitis right eye, upper and lower eyelids: Secondary | ICD-10-CM | POA: Diagnosis not present

## 2021-06-28 NOTE — Progress Notes (Signed)
[facility] at [facility] [address], [place], [place] [address] [phone]  Date:  07/01/2021   Name:  [name]   DOB:  Feb 04, 1931   MRN:  [id number]  PCP:  Darreld Mclean, MD    Chief Complaint: Bloated (Seen 01/24/21 for similar issues by Dr Larose Kells, had an US,was referred to GI- has not been yet./Flu shot: yes today/)   History of Present Illness:  [name] is a 87 y.o. very pleasant female patient who presents with the following:  Patient seen today with a couple of concerns.  Reason for visit listed as hallucinations and abdominal swelling I last saw [name] in April-history of hypertension, hyperlipidemia, hypothyroidism, breast cancer, more recently some difficulty with paranoia and anxiety Also, in August 2021 she had a fall and sustained an orbital fracture, subarachnoid hemorrhage, and thoracic compression fracture.  She had a kyphoplasty in February of this year  In April she also had concern of abdominal bloating.  We obtained a CT scan-this showed evidence of possible intussusception.  I referred her to general surgery for evaluation; however, I did not receive a note from them and I am not certain if she followed up Pt reports she did not go to general surgery, she ?thought she was not supposed to do so.  It sounds like she canceled the appointment as she did not think she would be up for surgery in any case  She saw Dr Larose Kells in July for the same issue- he did an [place] and referred her to GI.  Dr Kathline Magic office called her and they made an appointment but [name] then canceled it Here today with her daughter - she has noted more episodes of "choking" on her food recently - food getting stuck in her esophagus, she has to chew really carefully to avoid getting food stuck  She is having normal regular BM, no vomiting  CMP, CBC in April looked okay, chronic renal insufficiency  COVID-19 bivalent-encouraged her to get  this Flu vaccine- give today   She is getting back to her walking program and can currently walk about 45 minutes   I asked about hallucinations.  Patient notes that she sometimes will be out at various parks in our area going for a walk.  She may see people who she notes as being unusual or in an unexpected place.  When she looks again they will not be there.  We wonder if she may be hallucinating these people.  She has had this issue in the past when she was concerned that people were threatening her or following her.  She does not currently feel worried about these people she is seeing  Noted irregular pulse on exam today She sometimes feels a little short of breath when she is walking and notes she cannot walk and talk on the phone at the same time She does not notice palpitations or CP  She has seen cardiology in the past and reports doing a Holter monitor.  I do not see this report in her chart.  In any case, she would like to change her cardiology care to the med center as it is more convenient for her Patient Active Problem List   Diagnosis Date Noted   Chronic midline thoracic back pain 12/31/2020   Cellulitis of skin 12/31/2020   Paroxysmal SVT (supraventricular tachycardia) ([facility]) 12/23/2020   Intracranial hemorrhage ([facility])    Acute blood loss anemia  TBI (traumatic brain injury) ([place]) 05/04/2020   Back pain 05/04/2020   SAH (subarachnoid hemorrhage) ([place]) 04/29/2020   Fall at home, initial encounter 04/29/2020   Orbital fracture, closed, initial encounter ([place]) 04/29/2020   Basal cell carcinoma (BCC) in situ of skin 04/04/2020   Essential hypertension 03/21/2019   Stage 3a chronic kidney disease    Systolic murmur 76/73/4193   Hypercalcemia    AKI (acute kidney injury) ([place])    Dizziness 02/22/2019   Squamous cell carcinoma in situ of skin 11/30/2018   Diarrhea 09/08/2017   Nausea vomiting and diarrhea 09/08/2017   Elevated serum creatinine 09/08/2017   PVC (premature  ventricular contraction) 09/08/2017   Glaucoma 09/08/2017   Hx of meningioma of the brain 08/28/2017   Hoarseness of voice 02/28/2016   Raynaud's phenomenon 02/28/2016   Esophageal spasm 09/20/2013   Osteoporosis 03/18/2012   Fracture, shoulder 03/06/2012   Hyperlipidemia    Breast cancer ([place])     Past Medical History:  Diagnosis Date   Autoimmune disease ([place])    Barrett's esophagus    Bone lesion    sacrum   Breast cancer ([place]) 2010   cT1 N0 M0; ER+/ PR+/ HER2 neg; s/p lumpectomy 07/2010; started adjuvant hormonal therapy 10/2009   Cervical cancer ([facility])    Dysphagia    Fx distal radius NEC-closed    Glaucoma    Humerus fracture 03/2012   impacted left humueral neck fracture, treated by Dr. Amedeo Plenty   Hyperlipidemia    Hypertension    Hypothyroidism    Osteoporosis 03/18/2012   Pneumonia    Thyroid disease     Past Surgical History:  Procedure Laterality Date   ABDOMINAL HYSTERECTOMY  1972   cancer          1 tube 1 ovary   APPENDECTOMY     BACK SURGERY  11/28/2020   BREAST LUMPECTOMY Left 2010   colon polyp removal     EYE SURGERY     HAND SURGERY     KYPHOPLASTY N/A 11/28/2020   Procedure: KYPHOPLASTY THORACIC TWELVE AND LUMBAR ONE;  Surgeon: Melina Schools, MD;  Location: [facility];  Service: Orthopedics;  Laterality: N/A;  90 mins   ortho procedures     multiple   OVARIAN CYST REMOVAL  1978   SHOULDER SURGERY  1954   left    Social History   Tobacco Use   Smoking status: Former    Types: Cigarettes    Quit date: 12/16/1986    Years since quitting: 34.5   Smokeless tobacco: Never   Tobacco comments:    FORMER SMOKER 30 YEARS AGO  Vaping Use   Vaping Use: Never used  Substance Use Topics   Alcohol use: Yes    Alcohol/week: 0.0 standard drinks    Comment: OCCASIONALLY   Drug use: No    Family History  Problem Relation Age of Onset   Heart disease Father    Cancer Sister        lung, kidney   Cancer Brother        brain   Cancer Paternal Aunt         breast   Breast cancer Paternal Aunt    Cancer Brother        liver, lung   Osteoporosis Neg Hx     Allergies  Allergen Reactions   Daypro [Oxaprozin] Swelling    Face and tongue   Prolia [Denosumab]     unknown    Medication list has  been reviewed and updated.  Current Outpatient Medications on File Prior to Visit  Medication Sig Dispense Refill   amLODipine (NORVASC) 2.5 MG tablet Take 1 tablet (2.5 mg total) by mouth daily. 90 tablet 3   Calcium Carbonate-Vitamin D (CALCIUM + D PO) Take 1 capsule by mouth daily.      latanoprost (XALATAN) 0.005 % ophthalmic solution Place 1 drop into both eyes at bedtime.      levothyroxine (SYNTHROID) 75 MCG tablet TAKE 1 TABLET BY MOUTH DAILY BEFORE BREAKFAST 90 tablet 1   Multiple Vitamin (MULTIVITAMIN WITH MINERALS) TABS tablet Take 1 tablet by mouth daily. One a day     rosuvastatin (CRESTOR) 10 MG tablet Take 1 tablet (10 mg total) by mouth daily. TAKE 1 BY MOUTH DAILY 90 tablet 3   No current facility-administered medications on file prior to visit.    Review of Systems:  As per HPI- otherwise negative.   Physical Examination: Vitals:   07/01/21 0904  BP: 138/80  Pulse: 72  Resp: 18  Temp: 97.6 F (36.4 C)  SpO2: 99%   Vitals:   07/01/21 0904  Weight: 109 lb 3.2 oz (49.5 kg)   Body mass index is 19.34 kg/m. Ideal Body Weight:    GEN: no acute distress.  Petite build, looks well  On about 3 occasions during our visit she will have what seems to be an exaggerated startle reflex and will cry out in alarm.  The patient notes this is a "tic" which she has had for a long time.  It has waxed and waned over the years HEENT: Atraumatic, Normocephalic.  Ears and Nose: No external deformity. CV: Irregular heart rate, no M/G/R. No JVD. No thrill. No extra heart sounds. PULM: CTA B, no wheezes, crackles, rhonchi. No retractions. No resp. distress. No accessory muscle use. ABD: S, NT, ND, +BS. No rebound. No HSM.  Abdomen is  perhaps slightly distended, but not tense or tender.  Abdominal appearance may also be secondary to changes in her spine EXTR: No c/c/e PSYCH: Normally interactive. Conversant.  Severe scoliosis and kyphosis is present  EKG: Sinus rhythm.  She does have some changes including poor R wave progression and nonspecific T wave changes Assessment and Plan: Abdominal bloating - Plan: CBC, Comprehensive metabolic panel  Pulse irregularity - Plan: EKG 12-Lead  Mixed hyperlipidemia  Acquired hypothyroidism - Plan: TSH  Anxiety  Tics of organic origin - Plan: Ambulatory referral to Cardiology, cloNIDine (CATAPRES) 0.1 MG tablet  Need for influenza vaccination - Plan: Flu Vaccine QUAD High Dose(Fluad)  Patient seen today with a couple of concerns.  She has noted abdominal bloating which is not a new problem.  I advised her that she should see gastroenterology for this issue.  Her daughter will help her and they plan to call Dr. Michail Sermon  EKG is nonacute, she does not have atrial fibrillation.  I do think cardiology input could be helpful, I have placed a referral for her to be seen at the [facility] Her daughter plans to take more of a supervising role in her mom's health care as [name] has been sometimes canceling appointments which she needs  [place] was treated with clonidine-low-dose-in years past to suppress her tics.  This may also help with some of her anxiety.  She was taking just 0.1 mg daily in the past which is unlikely to harm her.  We will have her try this medication again  Flu shot given  This visit occurred  during the SARS-CoV-2 public health emergency.  Safety protocols were in place, including screening questions prior to the visit, additional usage of staff PPE, and extensive cleaning of exam room while observing appropriate contact time as indicated for disinfecting solutions.   Signed Lamar Blinks, MD  Received labs as below, message to patient Results for  orders placed or performed in visit on 07/01/21  CBC  Result Value Ref Range   WBC 10.9 (H) 4.0 - 10.5 K/uL   RBC 4.06 3.87 - 5.11 Mil/uL   Platelets 238.0 150.0 - 400.0 K/uL   Hemoglobin 12.2 12.0 - 15.0 g/dL   HCT 37.0 36.0 - 46.0 %   MCV 91.2 78.0 - 100.0 fl   MCHC 32.9 30.0 - 36.0 g/dL   RDW 15.6 (H) 11.5 - 15.5 %  Comprehensive metabolic panel  Result Value Ref Range   Sodium 140 135 - 145 mEq/L   Potassium 4.6 3.5 - 5.1 mEq/L   Chloride 103 96 - 112 mEq/L   CO2 30 19 - 32 mEq/L   Glucose, Bld 106 (H) 70 - 99 mg/dL   BUN 19 6 - 23 mg/dL   Creatinine, Ser 1.01 0.40 - 1.20 mg/dL   Total Bilirubin 0.4 0.2 - 1.2 mg/dL   Alkaline Phosphatase 102 39 - 117 U/L   AST 21 0 - 37 U/L   ALT 17 0 - 35 U/L   Total Protein 6.9 6.0 - 8.3 g/dL   Albumin 4.0 3.5 - 5.2 g/dL   GFR 49.80 (L) >60.00 mL/min   Calcium 10.4 8.4 - 10.5 mg/dL  TSH  Result Value Ref Range   TSH 0.36 0.35 - 5.50 uIU/mL

## 2021-07-01 ENCOUNTER — Encounter: Payer: Self-pay | Admitting: Family Medicine

## 2021-07-01 ENCOUNTER — Ambulatory Visit (INDEPENDENT_AMBULATORY_CARE_PROVIDER_SITE_OTHER): Payer: Medicare Other | Admitting: Family Medicine

## 2021-07-01 ENCOUNTER — Other Ambulatory Visit: Payer: Self-pay

## 2021-07-01 VITALS — BP 138/80 | HR 72 | Temp 97.6°F | Resp 18 | Wt 109.2 lb

## 2021-07-01 DIAGNOSIS — G2569 Other tics of organic origin: Secondary | ICD-10-CM

## 2021-07-01 DIAGNOSIS — E039 Hypothyroidism, unspecified: Secondary | ICD-10-CM | POA: Diagnosis not present

## 2021-07-01 DIAGNOSIS — E782 Mixed hyperlipidemia: Secondary | ICD-10-CM

## 2021-07-01 DIAGNOSIS — R140 Abdominal distension (gaseous): Secondary | ICD-10-CM | POA: Diagnosis not present

## 2021-07-01 DIAGNOSIS — Z23 Encounter for immunization: Secondary | ICD-10-CM

## 2021-07-01 DIAGNOSIS — R0989 Other specified symptoms and signs involving the circulatory and respiratory systems: Secondary | ICD-10-CM | POA: Diagnosis not present

## 2021-07-01 DIAGNOSIS — F419 Anxiety disorder, unspecified: Secondary | ICD-10-CM

## 2021-07-01 LAB — COMPREHENSIVE METABOLIC PANEL
ALT: 17 U/L (ref 0–35)
AST: 21 U/L (ref 0–37)
Albumin: 4 g/dL (ref 3.5–5.2)
Alkaline Phosphatase: 102 U/L (ref 39–117)
BUN: 19 mg/dL (ref 6–23)
CO2: 30 mEq/L (ref 19–32)
Calcium: 10.4 mg/dL (ref 8.4–10.5)
Chloride: 103 mEq/L (ref 96–112)
Creatinine, Ser: 1.01 mg/dL (ref 0.40–1.20)
GFR: 49.8 mL/min — ABNORMAL LOW (ref >60.00)
Glucose, Bld: 106 mg/dL — ABNORMAL HIGH (ref 70–99)
Potassium: 4.6 mEq/L (ref 3.5–5.1)
Sodium: 140 mEq/L (ref 135–145)
Total Bilirubin: 0.4 mg/dL (ref 0.2–1.2)
Total Protein: 6.9 g/dL (ref 6.0–8.3)

## 2021-07-01 LAB — CBC
HCT: 37 % (ref 36.0–46.0)
Hemoglobin: 12.2 g/dL (ref 12.0–15.0)
MCHC: 32.9 g/dL (ref 30.0–36.0)
MCV: 91.2 fl (ref 78.0–100.0)
Platelets: 238 10*3/uL (ref 150.0–400.0)
RBC: 4.06 Mil/uL (ref 3.87–5.11)
RDW: 15.6 % — ABNORMAL HIGH (ref 11.5–15.5)
WBC: 10.9 10*3/uL — ABNORMAL HIGH (ref 4.0–10.5)

## 2021-07-01 LAB — TSH: TSH: 0.36 u[IU]/mL (ref 0.35–5.50)

## 2021-07-01 MED ORDER — CLONIDINE HCL 0.1 MG PO TABS: 0.1000 mg | ORAL_TABLET | Freq: Every day | ORAL | 3 refills | Status: DC

## 2021-07-01 NOTE — Patient Instructions (Addendum)
Please call [facility] and schedule an appt to be seen by Dr Pamalee [facility] [address] [place], [place] [address] Call us Call: [phone]  Flu shot today   I will set you up to be seen by Cardiology here at the [facility] We can re-start clonidine once a day for tics and to hopefully reduce early heart beats

## 2021-07-29 ENCOUNTER — Other Ambulatory Visit (HOSPITAL_BASED_OUTPATIENT_CLINIC_OR_DEPARTMENT_OTHER): Payer: Self-pay | Admitting: Family Medicine

## 2021-07-29 DIAGNOSIS — Z1231 Encounter for screening mammogram for malignant neoplasm of breast: Secondary | ICD-10-CM

## 2021-07-31 DIAGNOSIS — K2270 Barrett's esophagus without dysplasia: Secondary | ICD-10-CM | POA: Insufficient documentation

## 2021-07-31 DIAGNOSIS — M359 Systemic involvement of connective tissue, unspecified: Secondary | ICD-10-CM | POA: Insufficient documentation

## 2021-07-31 DIAGNOSIS — M899 Disorder of bone, unspecified: Secondary | ICD-10-CM | POA: Insufficient documentation

## 2021-07-31 DIAGNOSIS — I10 Essential (primary) hypertension: Secondary | ICD-10-CM | POA: Insufficient documentation

## 2021-07-31 DIAGNOSIS — R1310 Dysphagia, unspecified: Secondary | ICD-10-CM | POA: Insufficient documentation

## 2021-07-31 DIAGNOSIS — J189 Pneumonia, unspecified organism: Secondary | ICD-10-CM | POA: Insufficient documentation

## 2021-07-31 DIAGNOSIS — E039 Hypothyroidism, unspecified: Secondary | ICD-10-CM | POA: Insufficient documentation

## 2021-07-31 DIAGNOSIS — C539 Malignant neoplasm of cervix uteri, unspecified: Secondary | ICD-10-CM | POA: Insufficient documentation

## 2021-08-16 ENCOUNTER — Other Ambulatory Visit: Payer: Self-pay

## 2021-08-16 ENCOUNTER — Ambulatory Visit: Payer: Medicare Other | Admitting: Cardiology

## 2021-08-16 ENCOUNTER — Encounter: Payer: Self-pay | Admitting: Cardiology

## 2021-08-16 VITALS — BP 150/70 | HR 84 | Ht 64.0 in | Wt 112.1 lb

## 2021-08-16 DIAGNOSIS — E782 Mixed hyperlipidemia: Secondary | ICD-10-CM

## 2021-08-16 DIAGNOSIS — I10 Essential (primary) hypertension: Secondary | ICD-10-CM

## 2021-08-16 DIAGNOSIS — I471 Supraventricular tachycardia: Secondary | ICD-10-CM | POA: Diagnosis not present

## 2021-08-16 NOTE — Patient Instructions (Signed)

## 2021-08-16 NOTE — Progress Notes (Signed)
Cardiology Office Note:    Date:  08/16/2021   ID:  [name], DOB 1931/03/24, MRN [id number]  PCP:  Darreld Mclean, MD  Cardiologist:  Jenean Lindau, MD   Referring MD: Darreld Mclean, MD    ASSESSMENT:    1. Essential hypertension   2. Paroxysmal SVT (supraventricular tachycardia) ([place])   3. Mixed hyperlipidemia    PLAN:    In order of problems listed above:  Primary prevention stressed with the patient.  Importance of compliance with diet medication stressed and she vocalized understanding. Essential hypertension: Blood pressure stable and diet was emphasized.  Lifestyle modification was urged.  She has excellent effort tolerance and I congratulated her about it. Paroxysmal supraventricular tachycardia: Asymptomatic at this time.  I reviewed records about echocardiogram and event monitor done in the past and discussed with her. Mixed dyslipidemia: On statin therapy and managed by primary care diet was emphasized. I told her that she is doing very well in terms of regular exercise and exercise program is excellent.  I do not think in view of the fact that she is doing well and hardly has any symptoms that any evaluation is mandated.  Previous records were reviewed and discussed with her and questions were answered to her satisfaction. Patient will be seen in follow-up appointment in 6 months or earlier if the patient has any concerns    Medication Adjustments/Labs and Tests Ordered: Current medicines are reviewed at length with the patient today.  Concerns regarding medicines are outlined above.  Orders Placed This Encounter  Procedures   EKG 12-Lead   No orders of the defined types were placed in this encounter.    History of Present Illness:    [name] is a 87 y.o. female who is being seen today for the evaluation of paroxysmal supraventricular tachycardia at the request of Copland, Gay Filler, MD. patient is a pleasant 87 year old female.  She has  past medical history of essential hypertension and paroxysmal supraventricular tachycardia and dyslipidemia.  She denies any problems at this time and takes care of activities of daily living.  She is here to be established.  She has no chest pain orthopnea PND palpitations or any syncope.  She tells me that she walks 2 to 3 miles on a regular basis without any symptoms.  At the time of my evaluation, the patient is alert awake oriented and in no distress.  Past Medical History:  Diagnosis Date   Acute blood loss anemia    AKI (acute kidney injury) ([place])    Autoimmune disease ([place])    Back pain 05/04/2020   Barrett's esophagus    Basal cell carcinoma (BCC) in situ of skin 04/04/2020   Path 03/2020, [facility]    Bone lesion    sacrum   Breast cancer ([place]) 2010   cT1 N0 M0; ER+/ PR+/ HER2 neg; s/p lumpectomy 07/2010; started adjuvant hormonal therapy 10/2009   Cellulitis of skin 12/31/2020   Cervical cancer ([place])    Chronic midline thoracic back pain 12/31/2020   Diarrhea 09/08/2017   Dizziness 02/22/2019   Dysphagia    Elevated serum creatinine 09/08/2017   Esophageal spasm 09/20/2013   Essential hypertension 03/21/2019   Fall at home, initial encounter 04/29/2020   Fracture, shoulder 03/06/2012   Pt seen at [facility] by Dr. Iran Planas on 03/09/2012 and 03/11/2012.   X-rays taken of left wrist, left elbow, and left shoulder.   Shoulder Fx: nondisplaced proximal humerus fracture  involving the greater tuberosity. Wrist: consistent with an old distal radius fracture with shortening present.       Glaucoma    Hoarseness of voice 02/28/2016   Humerus fracture 03/2012   impacted left humueral neck fracture, treated by Dr. Amedeo Plenty   Hx of meningioma of the brain 08/28/2017   Stable since 2010- 2018 on MRI   Hypercalcemia    Hyperlipidemia    Hypertension    Hypothyroidism    Intracranial hemorrhage (HCC)    Nausea vomiting and diarrhea 09/08/2017   Orbital fracture, closed,  initial encounter ([place]) 04/29/2020   Osteoporosis 03/18/2012   Paroxysmal SVT (supraventricular tachycardia) ([place]) 12/23/2020   Pneumonia    PVC (premature ventricular contraction) 09/08/2017   Raynaud's phenomenon 02/28/2016   SAH (subarachnoid hemorrhage) ([place]) 04/29/2020   Squamous cell carcinoma in situ of skin 11/30/2018   Path 11/1008, Dermatology managing    Stage 3a chronic kidney disease    Systolic murmur 04/15/8888   TBI (traumatic brain injury) 05/04/2020    Past Surgical History:  Procedure Laterality Date   ABDOMINAL HYSTERECTOMY  1972   cancer          1 tube 1 ovary   APPENDECTOMY     BACK SURGERY  11/28/2020   BREAST LUMPECTOMY Left 2010   colon polyp removal     EYE SURGERY     HAND SURGERY     KYPHOPLASTY N/A 11/28/2020   Procedure: KYPHOPLASTY THORACIC TWELVE AND LUMBAR ONE;  Surgeon: Melina Schools, MD;  Location: [facility];  Service: Orthopedics;  Laterality: N/A;  90 mins   ortho procedures     multiple   OVARIAN CYST REMOVAL  1978   SHOULDER SURGERY  1954   left    Current Medications: Current Meds  Medication Sig   amLODipine (NORVASC) 2.5 MG tablet Take 1 tablet (2.5 mg total) by mouth daily.   Calcium Carbonate-Vitamin D (CALCIUM + D PO) Take 1 capsule by mouth daily.    latanoprost (XALATAN) 0.005 % ophthalmic solution Place 1 drop into both eyes at bedtime.    levothyroxine (SYNTHROID) 75 MCG tablet TAKE 1 TABLET BY MOUTH DAILY BEFORE BREAKFAST   Multiple Vitamin (MULTIVITAMIN WITH MINERALS) TABS tablet Take 1 tablet by mouth daily. One a day   rosuvastatin (CRESTOR) 10 MG tablet Take 1 tablet (10 mg total) by mouth daily. TAKE 1 BY MOUTH DAILY     Allergies:   Daypro [oxaprozin] and Prolia [denosumab]   Social History   Socioeconomic History   Marital status: Married    Spouse name: Not on file   Number of children: 2   Years of education: 12   Highest education level: Not on file  Occupational History   Occupation: retired  Tobacco Use    Smoking status: Former    Types: Cigarettes    Quit date: 12/16/1986    Years since quitting: 34.6   Smokeless tobacco: Never   Tobacco comments:    FORMER SMOKER 30 YEARS AGO  Vaping Use   Vaping Use: Never used  Substance and Sexual Activity   Alcohol use: Yes    Alcohol/week: 0.0 standard drinks    Comment: OCCASIONALLY   Drug use: No   Sexual activity: Not on file  Other Topics Concern   Not on file  Social History Narrative   Married   Exercise: Yes   Patient lives with husband in a one story home.  Has 2 daughters.  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  Education: high school.     Right handed    Social Determinants of Health   Financial Resource Strain: Low Risk    Difficulty of Paying Living Expenses: Not hard at all  Food Insecurity: Not on file  Transportation Needs: Not on file  Physical Activity: Inactive   Days of Exercise per Week: 0 days   Minutes of Exercise per Session: 0 min  Stress: No Stress Concern Present   Feeling of Stress : Not at all  Social Connections: Moderately Integrated   Frequency of Communication with Friends and Family: More than three times a week   Frequency of Social Gatherings with Friends and Family: More than three times a week   Attends Religious Services: More than 4 times per year   Active Member of [employer] or Organizations: No   Attends Archivist Meetings: Never   Marital Status: Married     Family History: The patient's family history includes Breast cancer in her paternal aunt; Cancer in her brother, brother, paternal aunt, and sister; Heart disease in her father. There is no history of Osteoporosis.  ROS:   Please see the history of present illness.    All other systems reviewed and are negative.  EKGs/Labs/Other Studies Reviewed:    The following studies were reviewed today: EKG reveals sinus rhythm and nonspecific ST-T changes   Recent Labs: 07/01/2021: ALT 17; BUN 19; Creatinine, Ser 1.01; Hemoglobin 12.2;  Platelets 238.0; Potassium 4.6; Sodium 140; TSH 0.36  Recent Lipid Panel    Component Value Date/Time   CHOL 169 05/23/2019 0919   TRIG 50.0 05/23/2019 0919   HDL 78.70 05/23/2019 0919   CHOLHDL 2 05/23/2019 0919   VLDL 10.0 05/23/2019 0919   LDLCALC 80 05/23/2019 0919    Physical Exam:    VS:  BP (!) 150/70   Pulse 84   Ht _0  (1.626 m)   Wt 112 lb 1.9 oz (50.9 kg)   SpO2 98%   BMI 19.25 kg/m     Wt Readings from Last 3 Encounters:  08/16/21 112 lb 1.9 oz (50.9 kg)  07/01/21 109 lb 3.2 oz (49.5 kg)  04/16/21 109 lb 6 oz (49.6 kg)     GEN: Patient is in no acute distress HEENT: Normal NECK: No JVD; No carotid bruits LYMPHATICS: No lymphadenopathy CARDIAC: S1 S2 regular, 2/6 systolic murmur at the apex. RESPIRATORY:  Clear to auscultation without rales, wheezing or rhonchi  ABDOMEN: Soft, non-tender, non-distended MUSCULOSKELETAL:  No edema; No deformity  SKIN: Warm and dry NEUROLOGIC:  Alert and oriented x 3 PSYCHIATRIC:  Normal affect    Signed, Jenean Lindau, MD  08/16/2021 11:20 AM    [place]

## 2021-08-21 DIAGNOSIS — R140 Abdominal distension (gaseous): Secondary | ICD-10-CM | POA: Diagnosis not present

## 2021-08-21 DIAGNOSIS — R1084 Generalized abdominal pain: Secondary | ICD-10-CM | POA: Diagnosis not present

## 2021-09-04 ENCOUNTER — Ambulatory Visit: Payer: Medicare Other | Admitting: Family Medicine

## 2021-09-06 NOTE — Progress Notes (Deleted)
[facility] [address] [place], [place] [address] [phone]  Date:  09/11/2021   Name:  [name]   DOB:  Apr 04, 1931   MRN:  [id number]  PCP:  Darreld Mclean, MD    Chief Complaint: No chief complaint on file.   History of Present Illness:  [name] is a 87 y.o. very pleasant female patient who presents with the following:  Pt seen today with a skin concern Last visit with myself 9/22- history of hypertension, hyperlipidemia, hypothyroidism, breast cancer, more recently some difficulty with paranoia and anxiety Also, in Mccoy 2021 she had a fall and sustained an orbital fracture, subarachnoid hemorrhage, and thoracic compression fracture.  She had a kyphoplasty in February of this year  Seen by cardiology earlier this month  Essential hypertension: Blood pressure stable and diet was emphasized.  Lifestyle modification was urged.  She has excellent effort tolerance and I congratulated her about it. Paroxysmal supraventricular tachycardia: Asymptomatic at this time.  I reviewed records about echocardiogram and event monitor done in the past and discussed with her. Mixed dyslipidemia: On statin therapy and managed by primary care diet was emphasized. I told her that she is doing very well in terms of regular exercise and exercise program is excellent.   Covid bivalent Can give a dose of pneumovax  Flu is UTD  Patient Active Problem List   Diagnosis Date Noted   Autoimmune disease ([place]) 07/31/2021   Barrett's esophagus 07/31/2021   Bone lesion 07/31/2021   Cervical cancer ([place]) 07/31/2021   Dysphagia 07/31/2021   Hypertension 07/31/2021   Hypothyroidism 07/31/2021   Pneumonia 07/31/2021   Chronic midline thoracic back pain 12/31/2020   Cellulitis of skin 12/31/2020   Paroxysmal SVT (supraventricular tachycardia) ([place]) 12/23/2020   Intracranial hemorrhage (HCC)    Acute blood loss anemia    TBI  (traumatic brain injury) 05/04/2020   Back pain 05/04/2020   SAH (subarachnoid hemorrhage) ([place]) 04/29/2020   Fall at home, initial encounter 04/29/2020   Orbital fracture, closed, initial encounter ([place]) 04/29/2020   Basal cell carcinoma (BCC) in situ of skin 04/04/2020   Essential hypertension 03/21/2019   Stage 3a chronic kidney disease    Systolic murmur 07/19/1218   Hypercalcemia    AKI (acute kidney injury) ([place])    Dizziness 02/22/2019   Squamous cell carcinoma in situ of skin 11/30/2018   Diarrhea 09/08/2017   Nausea vomiting and diarrhea 09/08/2017   Elevated serum creatinine 09/08/2017   PVC (premature ventricular contraction) 09/08/2017   Glaucoma 09/08/2017   Hx of meningioma of the brain 08/28/2017   Hoarseness of voice 02/28/2016   Raynaud's phenomenon 02/28/2016   Esophageal spasm 09/20/2013   Osteoporosis 03/18/2012   Humerus fracture 03/2012   Fracture, shoulder 03/06/2012   Hyperlipidemia    Breast cancer ([place])     Past Medical History:  Diagnosis Date   Acute blood loss anemia    AKI (acute kidney injury) ([place])    Autoimmune disease ([place])    Back pain 05/04/2020   Barrett's esophagus    Basal cell carcinoma (BCC) in situ of skin 04/04/2020   Path 03/2020, [facility]    Bone lesion    sacrum   Breast cancer ([place]) 2010   cT1 N0 M0; ER+/ PR+/ HER2 neg; s/p lumpectomy 07/2010; started adjuvant hormonal therapy 10/2009   Cellulitis of skin 12/31/2020   Cervical cancer (HCC)    Chronic  midline thoracic back pain 12/31/2020   Diarrhea 09/08/2017   Dizziness 02/22/2019   Dysphagia    Elevated serum creatinine 09/08/2017   Esophageal spasm 09/20/2013   Essential hypertension 03/21/2019   Fall at home, initial encounter 04/29/2020   Fracture, shoulder 03/06/2012   Pt seen at [facility] by Dr. Iran Planas on 03/09/2012 and 03/11/2012.   X-rays taken of left wrist, left elbow, and left shoulder.   Shoulder Fx: nondisplaced proximal humerus fracture  involving the greater tuberosity. Wrist: consistent with an old distal radius fracture with shortening present.       Glaucoma    Hoarseness of voice 02/28/2016   Humerus fracture 03/2012   impacted left humueral neck fracture, treated by Dr. Amedeo Plenty   Hx of meningioma of the brain 08/28/2017   Stable since 2010- 2018 on MRI   Hypercalcemia    Hyperlipidemia    Hypertension    Hypothyroidism    Intracranial hemorrhage (HCC)    Nausea vomiting and diarrhea 09/08/2017   Orbital fracture, closed, initial encounter ([place]) 04/29/2020   Osteoporosis 03/18/2012   Paroxysmal SVT (supraventricular tachycardia) ([place]) 12/23/2020   Pneumonia    PVC (premature ventricular contraction) 09/08/2017   Raynaud's phenomenon 02/28/2016   SAH (subarachnoid hemorrhage) ([place]) 04/29/2020   Squamous cell carcinoma in situ of skin 11/30/2018   Path 11/1008, Dermatology managing    Stage 3a chronic kidney disease    Systolic murmur 0/30/0923   TBI (traumatic brain injury) 05/04/2020    Past Surgical History:  Procedure Laterality Date   ABDOMINAL HYSTERECTOMY  1972   cancer          1 tube 1 ovary   APPENDECTOMY     BACK SURGERY  11/28/2020   BREAST LUMPECTOMY Left 2010   colon polyp removal     EYE SURGERY     HAND SURGERY     KYPHOPLASTY N/A 11/28/2020   Procedure: KYPHOPLASTY THORACIC TWELVE AND LUMBAR ONE;  Surgeon: Melina Schools, MD;  Location: [facility];  Service: Orthopedics;  Laterality: N/A;  90 mins   ortho procedures     multiple   OVARIAN CYST REMOVAL  1978   SHOULDER SURGERY  1954   left    Social History   Tobacco Use   Smoking status: Former    Types: Cigarettes    Quit date: 12/16/1986    Years since quitting: 34.7   Smokeless tobacco: Never   Tobacco comments:    FORMER SMOKER 30 YEARS AGO  Vaping Use   Vaping Use: Never used  Substance Use Topics   Alcohol use: Yes    Alcohol/week: 0.0 standard drinks    Comment: OCCASIONALLY   Drug use: No    Family History  Problem Relation  Age of Onset   Heart disease Father    Cancer Sister        lung, kidney   Cancer Brother        brain   Cancer Paternal Aunt        breast   Breast cancer Paternal Aunt    Cancer Brother        liver, lung   Osteoporosis Neg Hx     Allergies  Allergen Reactions   Daypro [Oxaprozin] Swelling    Face and tongue   Prolia [Denosumab]     unknown    Medication list has been reviewed and updated.  Current Outpatient Medications on File Prior to Visit  Medication Sig Dispense Refill   amLODipine (NORVASC)  2.5 MG tablet Take 1 tablet (2.5 mg total) by mouth daily. 90 tablet 3   Calcium Carbonate-Vitamin D (CALCIUM + D PO) Take 1 capsule by mouth daily.      latanoprost (XALATAN) 0.005 % ophthalmic solution Place 1 drop into both eyes at bedtime.      levothyroxine (SYNTHROID) 75 MCG tablet TAKE 1 TABLET BY MOUTH DAILY BEFORE BREAKFAST 90 tablet 1   Multiple Vitamin (MULTIVITAMIN WITH MINERALS) TABS tablet Take 1 tablet by mouth daily. One a day     rosuvastatin (CRESTOR) 10 MG tablet Take 1 tablet (10 mg total) by mouth daily. TAKE 1 BY MOUTH DAILY 90 tablet 3   No current facility-administered medications on file prior to visit.    Review of Systems:  As per HPI- otherwise negative.   Physical Examination: There were no vitals filed for this visit. There were no vitals filed for this visit. There is no height or weight on file to calculate BMI. Ideal Body Weight:    GEN: no acute distress. HEENT: Atraumatic, Normocephalic.  Ears and Nose: No external deformity. CV: RRR, No M/G/R. No JVD. No thrill. No extra heart sounds. PULM: CTA B, no wheezes, crackles, rhonchi. No retractions. No resp. distress. No accessory muscle use. ABD: S, NT, ND, +BS. No rebound. No HSM. EXTR: No c/c/e PSYCH: Normally interactive. Conversant.    Assessment and Plan: ***  Signed Lamar Blinks, MD

## 2021-09-10 ENCOUNTER — Ambulatory Visit (HOSPITAL_BASED_OUTPATIENT_CLINIC_OR_DEPARTMENT_OTHER)
Admission: RE | Admit: 2021-09-10 | Discharge: 2021-09-10 | Disposition: A | Discharge status: Home / Self Care | Payer: Medicare Other | Source: Ambulatory Visit | Attending: Family Medicine | Admitting: Family Medicine

## 2021-09-10 ENCOUNTER — Other Ambulatory Visit: Payer: Self-pay

## 2021-09-10 DIAGNOSIS — Z1231 Encounter for screening mammogram for malignant neoplasm of breast: Secondary | ICD-10-CM | POA: Diagnosis not present

## 2021-09-11 ENCOUNTER — Ambulatory Visit: Payer: Medicare Other | Admitting: Family Medicine

## 2021-09-27 NOTE — Progress Notes (Signed)
[facility] [address] [place], [place] [address] [phone]  Date:  09/30/2021   Name:  [name]   DOB:  [birth year]   MRN:  [id number]  PCP:  Pearline Cables, MD    Chief Complaint: Medical Management of Chronic Issues (F/u /Tsh med refill)   History of Present Illness:  [name] is a 87 y.o. very pleasant female patient who presents with the following:  Pt seen today for a follow-up visit Last seen by myself in September - history of hypertension, hyperlipidemia, hypothyroidism, breast cancer, more recently some difficulty with paranoia and anxiety Also, in August 2021 she had a fall and sustained an orbital fracture, subarachnoid hemorrhage, and thoracic compression fracture.  She had a kyphoplasty in February of this year  Visit with cardiology in November: Primary prevention stressed with the patient.  Importance of compliance with diet medication stressed and she vocalized understanding. Essential hypertension: Blood pressure stable and diet was emphasized.  Lifestyle modification was urged.  She has excellent effort tolerance and I congratulated her about it. Paroxysmal supraventricular tachycardia: Asymptomatic at this time.  I reviewed records about echocardiogram and event monitor done in the past and discussed with her. Mixed dyslipidemia: On statin therapy and managed by primary care diet was emphasized.  At our last visit Harriett Sine had concern of food getting stuck upon swallowing some of the time, as well as abd bloating.  They planned to follow-up with her GI, Dr Bosie Clos- they did go and see him, he felt all was ok per her report  I will request a copy of recent OV notes  She is still doing her walking- sometimes she does stay indoors when it is very cold.  When it is warmer she will get out to a park   Covid bivalent- encouraged her to do this and she plans to do so Pneumonia - can offer a  dose of pneumovax.  Give Pneumovax today   Lab Results  Component Value Date   TSH 0.36 07/01/2021    Patient Active Problem List   Diagnosis Date Noted   Autoimmune disease (HCC) 07/31/2021   Barrett's esophagus 07/31/2021   Bone lesion 07/31/2021   Cervical cancer (HCC) 07/31/2021   Dysphagia 07/31/2021   Hypertension 07/31/2021   Hypothyroidism 07/31/2021   Pneumonia 07/31/2021   Chronic midline thoracic back pain 12/31/2020   Cellulitis of skin 12/31/2020   Paroxysmal SVT (supraventricular tachycardia) (HCC) 12/23/2020   Intracranial hemorrhage (HCC)    Acute blood loss anemia    TBI (traumatic brain injury) 05/04/2020   Back pain 05/04/2020   SAH (subarachnoid hemorrhage) (HCC) 04/29/2020   Fall at home, initial encounter 04/29/2020   Orbital fracture, closed, initial encounter (HCC) 04/29/2020   Basal cell carcinoma (BCC) in situ of skin 04/04/2020   Essential hypertension 03/21/2019   Stage 3a chronic kidney disease    Systolic murmur 02/23/2019   Hypercalcemia    AKI (acute kidney injury) (HCC)    Dizziness 02/22/2019   Squamous cell carcinoma in situ of skin 11/30/2018   Diarrhea 09/08/2017   Nausea vomiting and diarrhea 09/08/2017   Elevated serum creatinine 09/08/2017   PVC (premature ventricular contraction) 09/08/2017   Glaucoma 09/08/2017   Hx of meningioma of the brain 08/28/2017   Hoarseness of voice 02/28/2016   Raynaud's phenomenon 02/28/2016   Esophageal spasm 09/20/2013   Osteoporosis 03/18/2012   Humerus fracture 03/2012  Fracture, shoulder 03/06/2012   Hyperlipidemia    Breast cancer [facility])     Past Medical History:  Diagnosis Date   Acute blood loss anemia    AKI (acute kidney injury) ([place])    Autoimmune disease ([place])    Back pain 05/04/2020   Barrett's esophagus    Basal cell carcinoma (BCC) in situ of skin 04/04/2020   Path 03/2020, [facility]    Bone lesion    sacrum   Breast cancer ([place]) 2010   cT1 N0 M0; ER+/ PR+/ HER2 neg;  s/p lumpectomy 07/2010; started adjuvant hormonal therapy 10/2009   Cellulitis of skin 12/31/2020   Cervical cancer ([place])    Chronic midline thoracic back pain 12/31/2020   Diarrhea 09/08/2017   Dizziness 02/22/2019   Dysphagia    Elevated serum creatinine 09/08/2017   Esophageal spasm 09/20/2013   Essential hypertension 03/21/2019   Fall at home, initial encounter 04/29/2020   Fracture, shoulder 03/06/2012   Pt seen at [facility] by Dr. Iran Planas on 03/09/2012 and 03/11/2012.   X-rays taken of left wrist, left elbow, and left shoulder.   Shoulder Fx: nondisplaced proximal humerus fracture involving the greater tuberosity. Wrist: consistent with an old distal radius fracture with shortening present.       Glaucoma    Hoarseness of voice 02/28/2016   Humerus fracture 03/2012   impacted left humueral neck fracture, treated by Dr. Amedeo Plenty   Hx of meningioma of the brain 08/28/2017   Stable since 2010- 2018 on MRI   Hypercalcemia    Hyperlipidemia    Hypertension    Hypothyroidism    Intracranial hemorrhage (HCC)    Nausea vomiting and diarrhea 09/08/2017   Orbital fracture, closed, initial encounter ([place]) 04/29/2020   Osteoporosis 03/18/2012   Paroxysmal SVT (supraventricular tachycardia) ([place]) 12/23/2020   Pneumonia    PVC (premature ventricular contraction) 09/08/2017   Raynaud's phenomenon 02/28/2016   SAH (subarachnoid hemorrhage) ([place]) 04/29/2020   Squamous cell carcinoma in situ of skin 11/30/2018   Path 11/1008, Dermatology managing    Stage 3a chronic kidney disease    Systolic murmur 0/88/1103   TBI (traumatic brain injury) 05/04/2020    Past Surgical History:  Procedure Laterality Date   ABDOMINAL HYSTERECTOMY  1972   cancer          1 tube 1 ovary   APPENDECTOMY     BACK SURGERY  11/28/2020   BREAST LUMPECTOMY Left 2010   colon polyp removal     EYE SURGERY     HAND SURGERY     KYPHOPLASTY N/A 11/28/2020   Procedure: KYPHOPLASTY THORACIC TWELVE AND LUMBAR ONE;   Surgeon: Melina Schools, MD;  Location: [facility];  Service: Orthopedics;  Laterality: N/A;  90 mins   ortho procedures     multiple   OVARIAN CYST REMOVAL  1978   SHOULDER SURGERY  1954   left    Social History   Tobacco Use   Smoking status: Former    Types: Cigarettes    Quit date: 12/16/1986    Years since quitting: 34.8   Smokeless tobacco: Never   Tobacco comments:    FORMER SMOKER 30 YEARS AGO  Vaping Use   Vaping Use: Never used  Substance Use Topics   Alcohol use: Yes    Alcohol/week: 0.0 standard drinks    Comment: OCCASIONALLY   Drug use: No    Family History  Problem Relation Age of Onset   Heart disease Father  Cancer Sister        lung, kidney   Cancer Brother        brain   Cancer Paternal Aunt        breast   Breast cancer Paternal Aunt    Cancer Brother        liver, lung   Osteoporosis Neg Hx     Allergies  Allergen Reactions   Daypro [Oxaprozin] Swelling    Face and tongue   Prolia [Denosumab]     unknown    Medication list has been reviewed and updated.  Current Outpatient Medications on File Prior to Visit  Medication Sig Dispense Refill   amLODipine (NORVASC) 2.5 MG tablet Take 1 tablet (2.5 mg total) by mouth daily. 90 tablet 3   Calcium Carbonate-Vitamin D (CALCIUM + D PO) Take 1 capsule by mouth daily.      latanoprost (XALATAN) 0.005 % ophthalmic solution Place 1 drop into both eyes at bedtime.      Multiple Vitamin (MULTIVITAMIN WITH MINERALS) TABS tablet Take 1 tablet by mouth daily. One a day     rosuvastatin (CRESTOR) 10 MG tablet Take 1 tablet (10 mg total) by mouth daily. TAKE 1 BY MOUTH DAILY 90 tablet 3   No current facility-administered medications on file prior to visit.    Review of Systems:  As per HPI- otherwise negative.   Physical Examination: Vitals:   09/30/21 0845  BP: 138/70  Pulse: 74  Temp: (!) 95.7 F (35.4 C)  SpO2: 99%   Vitals:   09/30/21 0845  Weight: 114 lb 9.6 oz (52 kg)  Height: $Remove'5\' 3"'qgEsCpr$   (1.6 m)   Body mass index is 20.3 kg/m. Ideal Body Weight: Weight in (lb) to have BMI = 25: 140.8  GEN: no acute distress.  Looks well and her normal self HEENT: Atraumatic, Normocephalic.  Ears and Nose: No external deformity. CV: RRR, No M/G/R. No JVD. No thrill. No extra heart sounds. PULM: CTA B, no wheezes, crackles, rhonchi. No retractions. No resp. distress. No accessory muscle use. EXTR: No c/c/e PSYCH: Normally interactive. Conversant.    Assessment and Plan: Immunization due - Plan: Pneumococcal polysaccharide vaccine 23-valent greater than or equal to 2yo subcutaneous/IM  Acquired hypothyroidism - Plan: levothyroxine (SYNTHROID) 75 MCG tablet  Mixed hyperlipidemia  Following up today for routine visit.  Anabelen is in a great mood, she notes she is feeling very well.  She continues to stay physically active  Refill thyroid today  Give Pneumovax, recommended COVID-19 by [facility] booster  Assuming all is well follow-up in 4 to 6 months  Signed Lamar Blinks, MD

## 2021-09-27 NOTE — Patient Instructions (Addendum)
Great to see you again today- take care, please see me in 4-6 months assuming all is well   You got your pneumonia vaccine today Please get the latest covid booster asap as well- available at your pharmacy

## 2021-09-30 ENCOUNTER — Other Ambulatory Visit (HOSPITAL_BASED_OUTPATIENT_CLINIC_OR_DEPARTMENT_OTHER): Payer: Self-pay

## 2021-09-30 ENCOUNTER — Ambulatory Visit (INDEPENDENT_AMBULATORY_CARE_PROVIDER_SITE_OTHER): Payer: Medicare Other | Admitting: Family Medicine

## 2021-09-30 ENCOUNTER — Encounter: Payer: Self-pay | Admitting: Family Medicine

## 2021-09-30 ENCOUNTER — Ambulatory Visit: Payer: Medicare Other | Attending: Internal Medicine

## 2021-09-30 ENCOUNTER — Emergency Department (HOSPITAL_BASED_OUTPATIENT_CLINIC_OR_DEPARTMENT_OTHER): Payer: Medicare Other

## 2021-09-30 ENCOUNTER — Other Ambulatory Visit: Payer: Self-pay

## 2021-09-30 ENCOUNTER — Encounter (HOSPITAL_BASED_OUTPATIENT_CLINIC_OR_DEPARTMENT_OTHER): Payer: Self-pay | Admitting: Emergency Medicine

## 2021-09-30 ENCOUNTER — Emergency Department (HOSPITAL_BASED_OUTPATIENT_CLINIC_OR_DEPARTMENT_OTHER)
Admission: EM | Admit: 2021-09-30 | Discharge: 2021-09-30 | Disposition: A | Discharge status: Home / Self Care | Payer: Medicare Other | Attending: Emergency Medicine | Admitting: Emergency Medicine

## 2021-09-30 VITALS — BP 138/70 | HR 74 | Temp 95.7°F | Ht 63.0 in | Wt 114.6 lb

## 2021-09-30 DIAGNOSIS — R000 Tachycardia, unspecified: Secondary | ICD-10-CM | POA: Diagnosis not present

## 2021-09-30 DIAGNOSIS — Z87891 Personal history of nicotine dependence: Secondary | ICD-10-CM | POA: Insufficient documentation

## 2021-09-30 DIAGNOSIS — Z20822 Contact with and (suspected) exposure to covid-19: Secondary | ICD-10-CM | POA: Diagnosis not present

## 2021-09-30 DIAGNOSIS — I129 Hypertensive chronic kidney disease with stage 1 through stage 4 chronic kidney disease, or unspecified chronic kidney disease: Secondary | ICD-10-CM | POA: Insufficient documentation

## 2021-09-30 DIAGNOSIS — Z8541 Personal history of malignant neoplasm of cervix uteri: Secondary | ICD-10-CM | POA: Diagnosis not present

## 2021-09-30 DIAGNOSIS — Z23 Encounter for immunization: Secondary | ICD-10-CM | POA: Diagnosis not present

## 2021-09-30 DIAGNOSIS — R509 Fever, unspecified: Secondary | ICD-10-CM | POA: Diagnosis not present

## 2021-09-30 DIAGNOSIS — Z79899 Other long term (current) drug therapy: Secondary | ICD-10-CM | POA: Diagnosis not present

## 2021-09-30 DIAGNOSIS — N1831 Chronic kidney disease, stage 3a: Secondary | ICD-10-CM | POA: Diagnosis not present

## 2021-09-30 DIAGNOSIS — E782 Mixed hyperlipidemia: Secondary | ICD-10-CM | POA: Diagnosis not present

## 2021-09-30 DIAGNOSIS — T50Z95A Adverse effect of other vaccines and biological substances, initial encounter: Secondary | ICD-10-CM

## 2021-09-30 DIAGNOSIS — J189 Pneumonia, unspecified organism: Secondary | ICD-10-CM | POA: Diagnosis not present

## 2021-09-30 DIAGNOSIS — E039 Hypothyroidism, unspecified: Secondary | ICD-10-CM

## 2021-09-30 DIAGNOSIS — Z85820 Personal history of malignant melanoma of skin: Secondary | ICD-10-CM | POA: Insufficient documentation

## 2021-09-30 DIAGNOSIS — R42 Dizziness and giddiness: Secondary | ICD-10-CM | POA: Diagnosis not present

## 2021-09-30 DIAGNOSIS — Z853 Personal history of malignant neoplasm of breast: Secondary | ICD-10-CM | POA: Diagnosis not present

## 2021-09-30 DIAGNOSIS — N179 Acute kidney failure, unspecified: Secondary | ICD-10-CM | POA: Diagnosis not present

## 2021-09-30 LAB — COMPREHENSIVE METABOLIC PANEL
ALT: 28 U/L (ref 0–44)
AST: 42 U/L — ABNORMAL HIGH (ref 15–41)
Albumin: 3.9 g/dL (ref 3.5–5.0)
Alkaline Phosphatase: 110 U/L (ref 38–126)
Anion gap: 10 (ref 5–15)
BUN: 18 mg/dL (ref 8–23)
CO2: 26 mmol/L (ref 22–32)
Calcium: 9.6 mg/dL (ref 8.9–10.3)
Chloride: 98 mmol/L (ref 98–111)
Creatinine, Ser: 0.99 mg/dL (ref 0.44–1.00)
GFR, Estimated: 55 mL/min — ABNORMAL LOW (ref >60)
Glucose, Bld: 116 mg/dL — ABNORMAL HIGH (ref 70–99)
Potassium: 4.3 mmol/L (ref 3.5–5.1)
Sodium: 134 mmol/L — ABNORMAL LOW (ref 135–145)
Total Bilirubin: 0.7 mg/dL (ref 0.3–1.2)
Total Protein: 7.6 g/dL (ref 6.5–8.1)

## 2021-09-30 LAB — CBC WITH DIFFERENTIAL/PLATELET
Abs Immature Granulocytes: 0.06 10*3/uL (ref 0.00–0.07)
Basophils Absolute: 0 10*3/uL (ref 0.0–0.1)
Basophils Relative: 0 %
Eosinophils Absolute: 0.1 10*3/uL (ref 0.0–0.5)
Eosinophils Relative: 1 %
HCT: 39.9 % (ref 36.0–46.0)
Hemoglobin: 12.9 g/dL (ref 12.0–15.0)
Immature Granulocytes: 1 %
Lymphocytes Relative: 6 %
Lymphs Abs: 0.7 10*3/uL (ref 0.7–4.0)
MCH: 29.9 pg (ref 26.0–34.0)
MCHC: 32.3 g/dL (ref 30.0–36.0)
MCV: 92.6 fL (ref 80.0–100.0)
Monocytes Absolute: 0.6 10*3/uL (ref 0.1–1.0)
Monocytes Relative: 5 %
Neutro Abs: 10.8 10*3/uL — ABNORMAL HIGH (ref 1.7–7.7)
Neutrophils Relative %: 87 %
Platelets: 254 10*3/uL (ref 150–400)
RBC: 4.31 MIL/uL (ref 3.87–5.11)
RDW: 14.8 % (ref 11.5–15.5)
WBC: 12.3 10*3/uL — ABNORMAL HIGH (ref 4.0–10.5)
nRBC: 0 % (ref 0.0–0.2)

## 2021-09-30 LAB — URINALYSIS, ROUTINE W REFLEX MICROSCOPIC
Bilirubin Urine: NEGATIVE
Glucose, UA: NEGATIVE mg/dL
Hgb urine dipstick: NEGATIVE
Ketones, ur: NEGATIVE mg/dL
Nitrite: NEGATIVE
Protein, ur: 30 mg/dL — AB
Specific Gravity, Urine: 1.02 (ref 1.005–1.030)
pH: 8.5 — ABNORMAL HIGH (ref 5.0–8.0)

## 2021-09-30 LAB — LACTIC ACID, PLASMA: Lactic Acid, Venous: 1.8 mmol/L (ref 0.5–1.9)

## 2021-09-30 LAB — URINALYSIS, MICROSCOPIC (REFLEX): RBC / HPF: NONE SEEN RBC/hpf (ref 0–5)

## 2021-09-30 LAB — RESP PANEL BY RT-PCR (FLU A&B, COVID) ARPGX2
Influenza A by PCR: NEGATIVE
Influenza B by PCR: NEGATIVE
SARS Coronavirus 2 by RT PCR: NEGATIVE

## 2021-09-30 MED ORDER — SODIUM CHLORIDE 0.9 % IV BOLUS
500.0000 mL | Freq: Once | INTRAVENOUS | Status: AC
  Administered 2021-09-30: 19:00:00 500 mL via INTRAVENOUS

## 2021-09-30 MED ORDER — SODIUM CHLORIDE 0.9 % IV SOLN
1.0000 g | Freq: Once | INTRAVENOUS | Status: AC
  Administered 2021-09-30: 20:00:00 1 g via INTRAVENOUS
  Filled 2021-09-30: qty 10

## 2021-09-30 MED ORDER — LEVOTHYROXINE SODIUM 75 MCG PO TABS: ORAL_TABLET | ORAL | 3 refills | Status: DC

## 2021-09-30 MED ORDER — PFIZER COVID-19 VAC BIVALENT 30 MCG/0.3ML IM SUSP
INTRAMUSCULAR | 0 refills | Status: DC
  Filled 2021-09-30: qty 0.3, 1d supply, fill #0

## 2021-09-30 MED ORDER — CEPHALEXIN 500 MG PO CAPS: 500.0000 mg | ORAL_CAPSULE | Freq: Three times a day (TID) | ORAL | 0 refills | Status: DC

## 2021-09-30 MED ORDER — AZITHROMYCIN 250 MG PO TABS: ORAL_TABLET | ORAL | 0 refills | Status: DC

## 2021-09-30 MED ORDER — ACETAMINOPHEN 325 MG PO TABS
650.0000 mg | ORAL_TABLET | Freq: Once | ORAL | Status: AC
  Administered 2021-09-30: 19:00:00 650 mg via ORAL
  Filled 2021-09-30: qty 2

## 2021-09-30 NOTE — ED Provider Notes (Signed)
[facility] EMERGENCY DEPARTMENT Provider Note   CSN: [id number] Arrival date & time: 09/30/21  1814     History Chief Complaint  Patient presents with   Chills    [name] is a 87 y.o. female hx of anemia, barrett's esophagus, hypertension, here presenting with fever and chills.  Patient received her [place] booster shot today.  Patient states that around 3 PM she had sudden onset of chills.  She states that she is just shaking all over.  She feels very weak.  She did not take her temperature at home.  Denies any urinary symptoms.  No meds prior to arrival.  The history is provided by the patient.      Past Medical History:  Diagnosis Date   Acute blood loss anemia    AKI (acute kidney injury) ([place])    Autoimmune disease ([place])    Back pain 05/04/2020   Barrett's esophagus    Basal cell carcinoma (BCC) in situ of skin 04/04/2020   Path 03/2020, [facility]    Bone lesion    sacrum   Breast cancer ([place]) 2010   cT1 N0 M0; ER+/ PR+/ HER2 neg; s/p lumpectomy 07/2010; started adjuvant hormonal therapy 10/2009   Cellulitis of skin 12/31/2020   Cervical cancer ([place])    Chronic midline thoracic back pain 12/31/2020   Diarrhea 09/08/2017   Dizziness 02/22/2019   Dysphagia    Elevated serum creatinine 09/08/2017   Esophageal spasm 09/20/2013   Essential hypertension 03/21/2019   Fall at home, initial encounter 04/29/2020   Fracture, shoulder 03/06/2012   Pt seen at [facility] by Dr. Iran Planas on 03/09/2012 and 03/11/2012.   X-rays taken of left wrist, left elbow, and left shoulder.   Shoulder Fx: nondisplaced proximal humerus fracture involving the greater tuberosity. Wrist: consistent with an old distal radius fracture with shortening present.       Glaucoma    Hoarseness of voice 02/28/2016   Humerus fracture 03/2012   impacted left humueral neck fracture, treated by Dr. Amedeo Plenty   Hx of meningioma of the brain 08/28/2017   Stable since 2010- 2018 on MRI    Hypercalcemia    Hyperlipidemia    Hypertension    Hypothyroidism    Intracranial hemorrhage (HCC)    Nausea vomiting and diarrhea 09/08/2017   Orbital fracture, closed, initial encounter ([place]) 04/29/2020   Osteoporosis 03/18/2012   Paroxysmal SVT (supraventricular tachycardia) ([place]) 12/23/2020   Pneumonia    PVC (premature ventricular contraction) 09/08/2017   Raynaud's phenomenon 02/28/2016   SAH (subarachnoid hemorrhage) ([place]) 04/29/2020   Squamous cell carcinoma in situ of skin 11/30/2018   Path 11/1008, Dermatology managing    Stage 3a chronic kidney disease    Systolic murmur 3/84/6659   TBI (traumatic brain injury) 05/04/2020    Patient Active Problem List   Diagnosis Date Noted   Autoimmune disease ([place]) 07/31/2021   Barrett's esophagus 07/31/2021   Bone lesion 07/31/2021   Cervical cancer ([place]) 07/31/2021   Dysphagia 07/31/2021   Hypertension 07/31/2021   Hypothyroidism 07/31/2021   Pneumonia 07/31/2021   Chronic midline thoracic back pain 12/31/2020   Cellulitis of skin 12/31/2020   Paroxysmal SVT (supraventricular tachycardia) ([place]) 12/23/2020   Intracranial hemorrhage (HCC)    Acute blood loss anemia    TBI (traumatic brain injury) 05/04/2020   Back pain 05/04/2020   SAH (subarachnoid hemorrhage) ([place]) 04/29/2020   Fall at home, initial encounter 04/29/2020   Orbital fracture, closed, initial encounter ([place]) 04/29/2020  Basal cell carcinoma (BCC) in situ of skin 04/04/2020   Essential hypertension 03/21/2019   Stage 3a chronic kidney disease    Systolic murmur 93/23/5573   Hypercalcemia    AKI (acute kidney injury) ([place])    Dizziness 02/22/2019   Squamous cell carcinoma in situ of skin 11/30/2018   Diarrhea 09/08/2017   Nausea vomiting and diarrhea 09/08/2017   Elevated serum creatinine 09/08/2017   PVC (premature ventricular contraction) 09/08/2017   Glaucoma 09/08/2017   Hx of meningioma of the brain 08/28/2017   Hoarseness of voice 02/28/2016    Raynaud's phenomenon 02/28/2016   Esophageal spasm 09/20/2013   Osteoporosis 03/18/2012   Humerus fracture 03/2012   Fracture, shoulder 03/06/2012   Hyperlipidemia    Breast cancer [facility])     Past Surgical History:  Procedure Laterality Date   ABDOMINAL HYSTERECTOMY  1972   cancer          1 tube 1 ovary   APPENDECTOMY     BACK SURGERY  11/28/2020   BREAST LUMPECTOMY Left 2010   colon polyp removal     EYE SURGERY     HAND SURGERY     KYPHOPLASTY N/A 11/28/2020   Procedure: KYPHOPLASTY THORACIC TWELVE AND LUMBAR ONE;  Surgeon: Melina Schools, MD;  Location: [facility];  Service: Orthopedics;  Laterality: N/A;  90 mins   ortho procedures     multiple   OVARIAN CYST [place]   left     OB History   No obstetric history on file.     Family History  Problem Relation Age of Onset   Heart disease Father    Cancer Sister        lung, kidney   Cancer Brother        brain   Cancer Paternal Aunt        breast   Breast cancer Paternal Aunt    Cancer Brother        liver, lung   Osteoporosis Neg Hx     Social History   Tobacco Use   Smoking status: Former    Types: Cigarettes    Quit date: 12/16/1986    Years since quitting: 34.8   Smokeless tobacco: Never   Tobacco comments:    FORMER SMOKER 30 YEARS AGO  Vaping Use   Vaping Use: Never used  Substance Use Topics   Alcohol use: Yes    Alcohol/week: 0.0 standard drinks    Comment: OCCASIONALLY   Drug use: No    Home Medications Prior to Admission medications   Medication Sig Start Date End Date Taking? Authorizing Provider  amLODipine (NORVASC) 2.5 MG tablet Take 1 tablet (2.5 mg total) by mouth daily. 05/27/21   Copland, Gay Filler, MD  Calcium Carbonate-Vitamin D (CALCIUM + D PO) Take 1 capsule by mouth daily.     [provider]  COVID-19 mRNA bivalent vaccine, Pfizer, (PFIZER COVID-19 VAC BIVALENT) injection Inject into the muscle. 09/30/21   Carlyle Basques, MD  latanoprost  (XALATAN) 0.005 % ophthalmic solution Place 1 drop into both eyes at bedtime.     [provider]  levothyroxine (SYNTHROID) 75 MCG tablet TAKE 1 TABLET BY MOUTH DAILY BEFORE BREAKFAST 09/30/21   Copland, Gay Filler, MD  Multiple Vitamin (MULTIVITAMIN WITH MINERALS) TABS tablet Take 1 tablet by mouth daily. One a day    [provider]  rosuvastatin (CRESTOR) 10 MG tablet Take 1 tablet (10 mg total) by mouth  daily. TAKE 1 BY MOUTH DAILY 05/27/21   Copland, Gay Filler, MD    Allergies    Daypro [oxaprozin] and Prolia [denosumab]  Review of Systems   Review of Systems  Constitutional:  Positive for chills and fever.  All other systems reviewed and are negative.  Physical Exam Updated Vital Signs BP (!) 110/59    Pulse 91    Temp 99.1 F (37.3 C) (Oral)    Resp 14    Ht $R'5\' 3"'hd$  (1.6 m)    Wt 52 kg    SpO2 94%    BMI 20.30 kg/m   Physical Exam Vitals and nursing note reviewed.  Constitutional:      Comments: Some chills and dehydrated   HENT:     Head: Normocephalic.     Nose: Nose normal.     Mouth/Throat:     Mouth: Mucous membranes are dry.  Eyes:     Extraocular Movements: Extraocular movements intact.     Pupils: Pupils are equal, round, and reactive to light.  Cardiovascular:     Rate and Rhythm: Regular rhythm. Tachycardia present.     Pulses: Normal pulses.     Heart sounds: Normal heart sounds.  Pulmonary:     Effort: Pulmonary effort is normal.     Breath sounds: Normal breath sounds.  Abdominal:     General: Abdomen is flat.     Palpations: Abdomen is soft.  Musculoskeletal:        General: Normal range of motion.     Cervical back: Normal range of motion and neck supple.  Skin:    General: Skin is warm.     Capillary Refill: Capillary refill takes less than 2 seconds.  Neurological:     General: No focal deficit present.     Mental Status: She is alert and oriented to person, place, and time.  Psychiatric:        Mood and Affect: Mood normal.         Behavior: Behavior normal.    ED Results / Procedures / Treatments   Labs (all labs ordered are listed, but only abnormal results are displayed) Labs Reviewed  CBC WITH DIFFERENTIAL/PLATELET - Abnormal; Notable for the following components:      Result Value   WBC 12.3 (*)    Neutro Abs 10.8 (*)    All other components within normal limits  COMPREHENSIVE METABOLIC PANEL - Abnormal; Notable for the following components:   Sodium 134 (*)    Glucose, Bld 116 (*)    AST 42 (*)    GFR, Estimated 55 (*)    All other components within normal limits  URINALYSIS, ROUTINE W REFLEX MICROSCOPIC - Abnormal; Notable for the following components:   pH 8.5 (*)    Protein, ur 30 (*)    Leukocytes,Ua TRACE (*)    All other components within normal limits  URINALYSIS, MICROSCOPIC (REFLEX) - Abnormal; Notable for the following components:   Bacteria, UA RARE (*)    All other components within normal limits  RESP PANEL BY RT-PCR (FLU A&B, COVID) ARPGX2  CULTURE, BLOOD (ROUTINE X 2)  CULTURE, BLOOD (ROUTINE X 2)  URINE CULTURE  LACTIC ACID, PLASMA  LACTIC ACID, PLASMA    EKG EKG Interpretation  Date/Time:  Monday September 30 2021 19:26:29 EST Ventricular Rate:  96 PR Interval:  133 QRS Duration: 85 QT Interval:  339 QTC Calculation: 429 R Axis:   94 Text Interpretation: Sinus rhythm Atrial premature complexes Probable left  atrial enlargement Right axis deviation Low voltage, precordial leads Borderline T abnormalities, inferior leads No significant change since last tracing Confirmed by Wandra Arthurs ([id number]) on 09/30/2021 7:45:53 PM  Radiology DG Chest Port 1 View  Result Date: 09/30/2021 CLINICAL DATA:  Fever. EXAM: PORTABLE CHEST 1 VIEW COMPARISON:  Chest x-ray 11/26/2020. CT abdomen and pelvis 01/30/2021. FINDINGS: There is focal right lower lobe airspace disease which has mildly increased. Surgical clips overlie the left lower chest. There is no pleural effusion or pneumothorax.  The cardiomediastinal silhouette is within normal limits. No acute fractures are seen. IMPRESSION: 1. Mild increase in right lower lobe airspace disease. Given chronicity, a follow-up nonemergent chest CT should be considered. Electronically Signed   By: Ronney Asters M.D.   On: 09/30/2021 19:38    Procedures Procedures   Medications Ordered in ED Medications  cefTRIAXone (ROCEPHIN) 1 g in sodium chloride 0.9 % 100 mL IVPB (1 g Intravenous New Bag/Given 09/30/21 2023)  acetaminophen (TYLENOL) tablet 650 mg (650 mg Oral Given 09/30/21 1927)  sodium chloride 0.9 % bolus 500 mL (0 mLs Intravenous Stopped 09/30/21 2021)    ED Course  I have reviewed the triage vital signs and the nursing notes.  Pertinent labs & imaging results that were available during my care of the patient were reviewed by me and considered in my medical decision making (see chart for details).    MDM Rules/Calculators/A&P                         DAVINIA RICCARDI is a 87 y.o. female here presenting with chills and subjective fever.  She received COVID-vaccine earlier today. Patient is febrile 101 and tachycardic.  Patient likely has side effect of Pfizer vaccine.  However, will need to rule out sepsis from UTI versus pneumonia versus flu.  We will get CBC and CMP and lactate and cultures and chest x-ray and urinalysis and COVID and flu test.  Will hydrate and give tylenol and reassess.   8:50 PM WBC is 12.  UA normal.  Possible pneumonia on chest x-ray.  Patient is tachycardia and fever has resolved.  Given Rocephin in the ED.  Will discharge patient home with Keflex and azithromycin.  I still think likely she has reaction to the vaccine but given that she may have a pneumonia, I want to give her a course of antibiotics     Final Clinical Impression(s) / ED Diagnoses Final diagnoses:  None    Rx / DC Orders ED Discharge Orders     None        Drenda Freeze, MD 09/30/21 2051

## 2021-09-30 NOTE — ED Notes (Signed)
First contact with patient. Patient arrived via triage from home with complaints of "shaking" after PNA and covid vaccine x today. Patient has no other complaints of pain or discomfort.  Pt is A&OX 4. Respirations even/unlabored. Intermittent shaking and chills noted/reported. Patient changed into gown and placed on cardiac monitor and call light within reach. Patient updated on plan of care. Will continue to monitor patient.

## 2021-09-30 NOTE — Progress Notes (Signed)
° °  Covid-19 Vaccination Clinic  Name:  ALLEAN MONTFORT    MRN: [id number] DOB: [birth year]  09/30/2021  Ms. Gage was observed post Covid-19 immunization for 15 minutes without incident. She was provided with Vaccine Information Sheet and instruction to access the V-Safe system.   Ms. Dura was instructed to call 911 with any severe reactions post vaccine: Difficulty breathing  Swelling of face and throat  A fast heartbeat  A bad rash all over body  Dizziness and weakness   Immunizations Administered     Name Date Dose VIS Date Route   Pfizer Covid-19 Vaccine Bivalent Booster 09/30/2021  9:28 AM 0.3 mL 06/12/2021 Intramuscular   Manufacturer: [place]   Lot: [id number]   [place]: [phone]

## 2021-09-30 NOTE — ED Notes (Signed)
No acute distress noted upon this RN's departure of patient. Verified discharge paperwork with name and DOB. Vital signs stable. Patient taken to checkout window. Discharge paperwork discussed with patient and husband. No further questions voiced upon discharge.

## 2021-09-30 NOTE — ED Triage Notes (Signed)
Pt states got a covid booster this afternoon and since  she has had the chills and feels very weak , has the bad shakes

## 2021-09-30 NOTE — Discharge Instructions (Signed)
You likely have a reaction to the vaccine  However there is possible pneumonia on your chest x-ray.  I recommend taking Keflex and azithromycin as prescribed  See your doctor for follow-up.  Take Tylenol or Motrin for fever  Return to ER if you have trouble breathing, chills, vomiting

## 2021-10-01 ENCOUNTER — Telehealth: Payer: Self-pay | Admitting: Family Medicine

## 2021-10-01 LAB — URINE CULTURE: Culture: <10000 — AB

## 2021-10-01 NOTE — Telephone Encounter (Signed)
Called pt to check on her- she is feeling much better

## 2021-10-05 LAB — CULTURE, BLOOD (ROUTINE X 2)
Culture: NO GROWTH
Culture: NO GROWTH
Special Requests: ADEQUATE
Special Requests: ADEQUATE

## 2021-10-16 ENCOUNTER — Ambulatory Visit: Payer: Medicare Other | Admitting: Family Medicine

## 2021-10-17 DIAGNOSIS — H0102A Squamous blepharitis right eye, upper and lower eyelids: Secondary | ICD-10-CM | POA: Diagnosis not present

## 2021-10-17 DIAGNOSIS — H4322 Crystalline deposits in vitreous body, left eye: Secondary | ICD-10-CM | POA: Diagnosis not present

## 2021-10-17 DIAGNOSIS — H02052 Trichiasis without entropian right lower eyelid: Secondary | ICD-10-CM | POA: Diagnosis not present

## 2021-10-17 DIAGNOSIS — H401132 Primary open-angle glaucoma, bilateral, moderate stage: Secondary | ICD-10-CM | POA: Diagnosis not present

## 2021-11-19 DIAGNOSIS — L97221 Non-pressure chronic ulcer of left calf limited to breakdown of skin: Secondary | ICD-10-CM | POA: Diagnosis not present

## 2021-11-19 DIAGNOSIS — Z85828 Personal history of other malignant neoplasm of skin: Secondary | ICD-10-CM | POA: Diagnosis not present

## 2021-12-21 IMAGING — RF DG C-ARM 1-60 MIN
2 series · 2 of 2 positions shown · non-contrast
Comparison: Thoracic spine radiographs 05/24/2020. Chest CT
09/22/2010.

CLINICAL DATA: Surgery, elective. Additional history provided:
T12-L1 kyphoplasty. Provided fluoroscopy time 3 minutes, Samsunnahar Hachan
seconds (32.5 mGy).

EXAM:
THORACOLUMBAR SPINE 1V

[Series 1: run · 1 of 1 slices shown (1 of 2)]
[im 1/1]
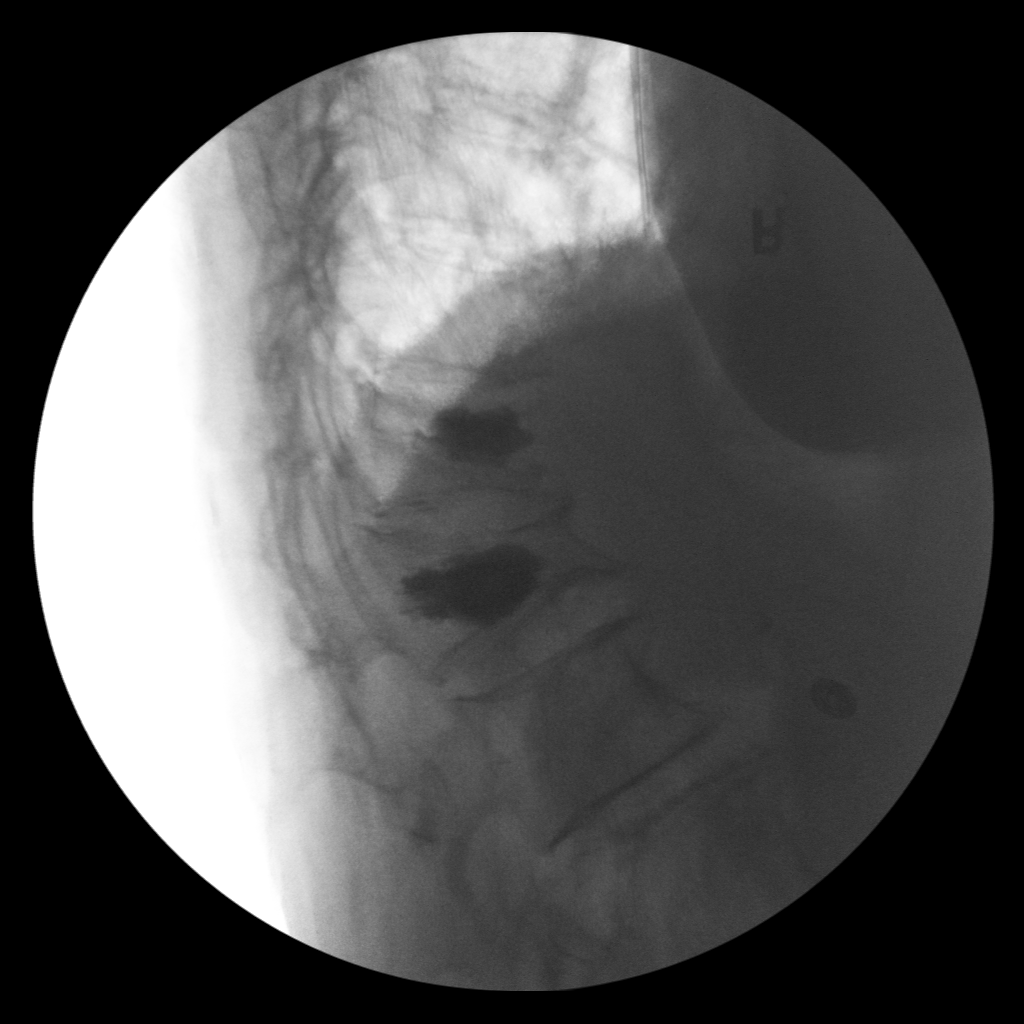

[Series 1: run · 1 of 1 slices shown (2 of 2)]
[im 1/1]
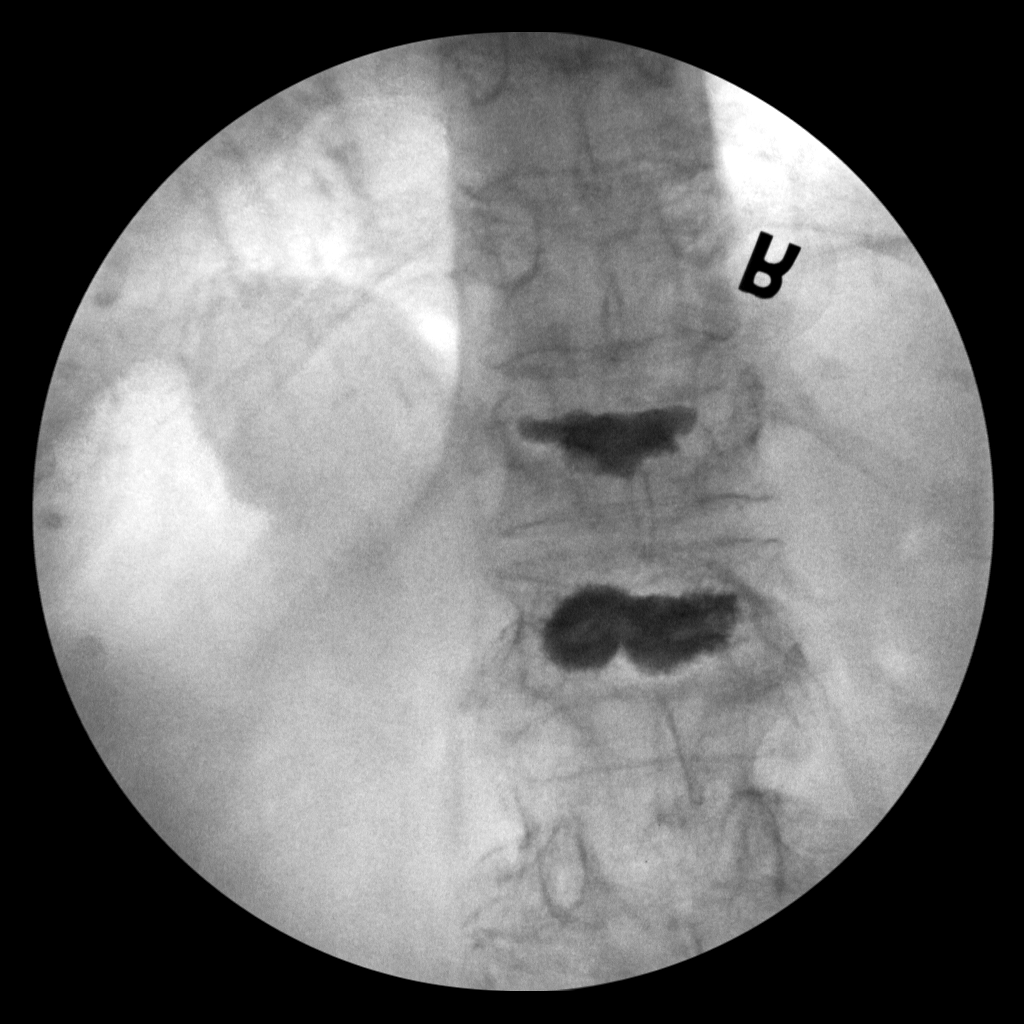

[2 of 2 positions shown; findings below may reference images not displayed]

FINDINGS: PA and lateral view intraprocedural fluoroscopic images of the
thoracolumbar spine are submitted, 2 images total. The images
demonstrate kyphoplasty material within the T12 and L1 vertebrae, at
site of known compression fractures.
IMPRESSION: Two intraprocedural fluoroscopic images of the thoracolumbar spine
from T12 and L1 kyphoplasty, as described.

## 2021-12-21 IMAGING — RF DG THORACOLUMBAR SPINE 2V
1 series · 1 of 1 positions shown · non-contrast
Comparison: Thoracic spine radiographs 05/24/2020. Chest CT
09/22/2010.

CLINICAL DATA: Surgery, elective. Additional history provided:
T12-L1 kyphoplasty. Provided fluoroscopy time 3 minutes, Samsunnahar Hachan
seconds (32.5 mGy).

EXAM:
THORACOLUMBAR SPINE 1V

[Series 1: run · 1 of 1 slices shown]
[im 1/1]
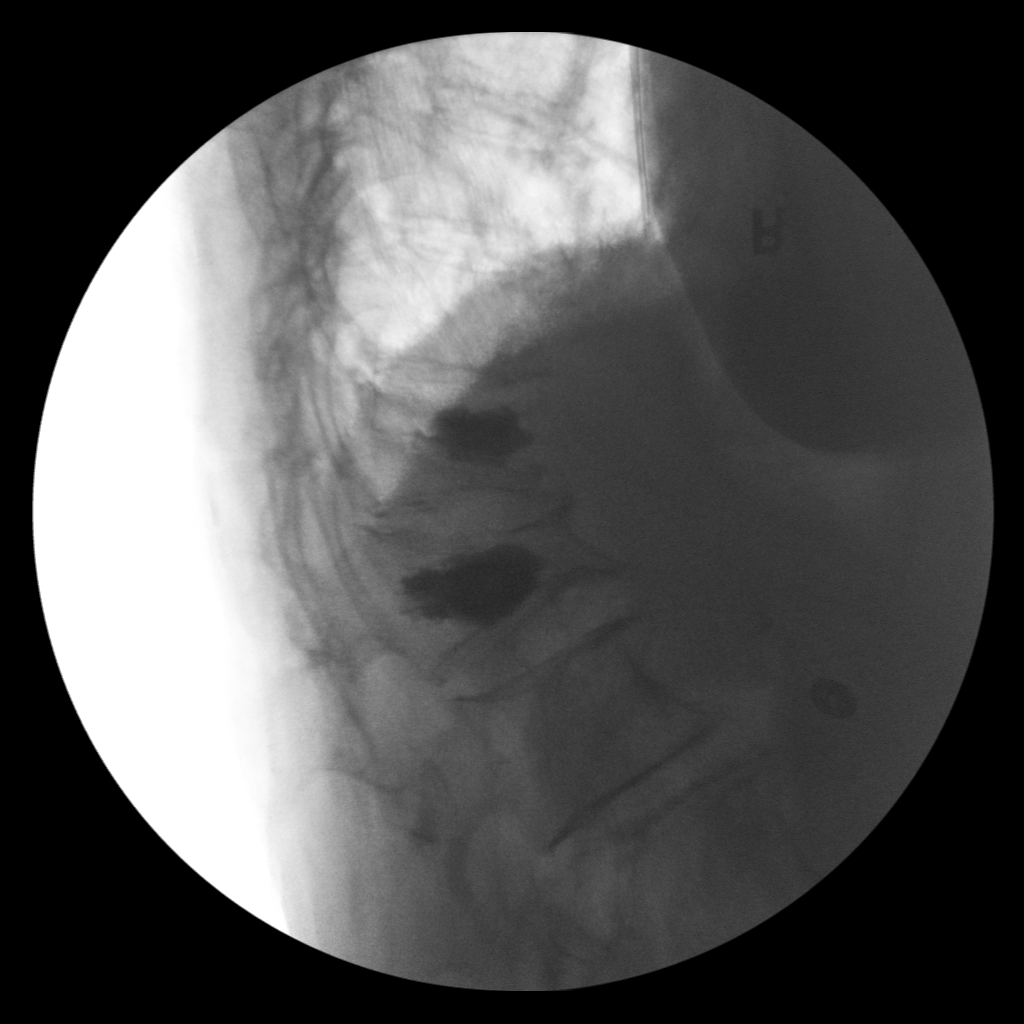

[1 of 1 positions shown; findings below may reference images not displayed]

FINDINGS: PA and lateral view intraprocedural fluoroscopic images of the
thoracolumbar spine are submitted, 2 images total. The images
demonstrate kyphoplasty material within the T12 and L1 vertebrae, at
site of known compression fractures.
IMPRESSION: Two intraprocedural fluoroscopic images of the thoracolumbar spine
from T12 and L1 kyphoplasty, as described.

## 2022-01-06 DIAGNOSIS — Z85828 Personal history of other malignant neoplasm of skin: Secondary | ICD-10-CM | POA: Diagnosis not present

## 2022-01-06 DIAGNOSIS — C44719 Basal cell carcinoma of skin of left lower limb, including hip: Secondary | ICD-10-CM | POA: Diagnosis not present

## 2022-01-16 ENCOUNTER — Ambulatory Visit (INDEPENDENT_AMBULATORY_CARE_PROVIDER_SITE_OTHER): Payer: Medicare Other

## 2022-01-16 DIAGNOSIS — Z0000 Encounter for general adult medical examination without abnormal findings: Secondary | ICD-10-CM

## 2022-01-16 DIAGNOSIS — E2839 Other primary ovarian failure: Secondary | ICD-10-CM | POA: Diagnosis not present

## 2022-01-16 DIAGNOSIS — Z780 Asymptomatic menopausal state: Secondary | ICD-10-CM | POA: Diagnosis not present

## 2022-01-16 NOTE — Patient Instructions (Signed)
[name] , ?Thank you for taking time to come for your Medicare Wellness Visit. I appreciate your ongoing commitment to your health goals. Please review the following plan we discussed and let me know if I can assist you in the future.  ? ?Screening recommendations/referrals: ?Colonoscopy: no longer needed ?Mammogram: 10/22 due 10/23 ?Bone Density: ordered 01/16/22 ?Recommended yearly ophthalmology/optometry visit for glaucoma screening and checkup ?Recommended yearly dental visit for hygiene and checkup ? ?Vaccinations: ?Influenza vaccine: up to date ?Pneumococcal vaccine: up to date ?Tdap vaccine: up to date ?Shingles vaccine: up to date   ?Covid-19:completed ? ?Advanced directives: yes, on file ? ?Conditions/risks identified: see problem list  ? ?Next appointment: Follow up in one year for your annual wellness visit 01/20/23 ? ? ?Preventive Care 37 Years and Older, Female ?Preventive care refers to lifestyle choices and visits with your health care provider that can promote health and wellness. ?What does preventive care include? ?A yearly physical exam. This is also called an annual well check. ?Dental exams once or twice a year. ?Routine eye exams. Ask your health care provider how often you should have your eyes checked. ?Personal lifestyle choices, including: ?Daily care of your teeth and gums. ?Regular physical activity. ?Eating a healthy diet. ?Avoiding tobacco and drug use. ?Limiting alcohol use. ?Practicing safe sex. ?Taking low-dose aspirin every day. ?Taking vitamin and mineral supplements as recommended by your health care provider. ?What happens during an annual well check? ?The services and screenings done by your health care provider during your annual well check will depend on your age, overall health, lifestyle risk factors, and family history of disease. ?Counseling  ?Your health care provider may ask you questions about your: ?Alcohol use. ?Tobacco use. ?Drug use. ?Emotional well-being. ?Home and  relationship well-being. ?Sexual activity. ?Eating habits. ?History of falls. ?Memory and ability to understand (cognition). ?Work and work Statistician. ?Reproductive health. ?Screening  ?You may have the following tests or measurements: ?Height, weight, and BMI. ?Blood pressure. ?Lipid and cholesterol levels. These may be checked every 5 years, or more frequently if you are over 4 years old. ?Skin check. ?Lung cancer screening. You may have this screening every year starting at age 28 if you have a 30-pack-year history of smoking and currently smoke or have quit within the past 15 years. ?Fecal occult blood test (FOBT) of the stool. You may have this test every year starting at age 47. ?Flexible sigmoidoscopy or colonoscopy. You may have a sigmoidoscopy every 5 years or a colonoscopy every 10 years starting at age 55. ?Hepatitis C blood test. ?Hepatitis B blood test. ?Sexually transmitted disease (STD) testing. ?Diabetes screening. This is done by checking your blood sugar (glucose) after you have not eaten for a while (fasting). You may have this done every 1-3 years. ?Bone density scan. This is done to screen for osteoporosis. You may have this done starting at age 59. ?Mammogram. This may be done every 1-2 years. Talk to your health care provider about how often you should have regular mammograms. ?Talk with your health care provider about your test results, treatment options, and if necessary, the need for more tests. ?Vaccines  ?Your health care provider may recommend certain vaccines, such as: ?Influenza vaccine. This is recommended every year. ?Tetanus, diphtheria, and acellular pertussis (Tdap, Td) vaccine. You may need a Td booster every 10 years. ?Zoster vaccine. You may need this after age 85. ?Pneumococcal 13-valent conjugate (PCV13) vaccine. One dose is recommended after age 58. ?Pneumococcal polysaccharide (PPSV23) vaccine. One  dose is recommended after age 62. ?Talk to your health care provider  about which screenings and vaccines you need and how often you need them. ?This information is not intended to replace advice given to you by your health care provider. Make sure you discuss any questions you have with your health care provider. ?Document Released: 10/26/2015 Document Revised: 06/18/2016 Document Reviewed: 07/31/2015 ?Elsevier Interactive Patient Education ? 2017 [place]. ? ?Fall Prevention in the Home ?Falls can cause injuries. They can happen to people of all ages. There are many things you can do to make your home safe and to help prevent falls. ?What can I do on the outside of my home? ?Regularly fix the edges of walkways and driveways and fix any cracks. ?Remove anything that might make you trip as you walk through a door, such as a raised step or threshold. ?Trim any bushes or trees on the path to your home. ?Use bright outdoor lighting. ?Clear any walking paths of anything that might make someone trip, such as rocks or tools. ?Regularly check to see if handrails are loose or broken. Make sure that both sides of any steps have handrails. ?Any raised decks and porches should have guardrails on the edges. ?Have any leaves, snow, or ice cleared regularly. ?Use sand or salt on walking paths during winter. ?Clean up any spills in your garage right away. This includes oil or grease spills. ?What can I do in the bathroom? ?Use night lights. ?Install grab bars by the toilet and in the tub and shower. Do not use towel bars as grab bars. ?Use non-skid mats or decals in the tub or shower. ?If you need to sit down in the shower, use a plastic, non-slip stool. ?Keep the floor dry. Clean up any water that spills on the floor as soon as it happens. ?Remove soap buildup in the tub or shower regularly. ?Attach bath mats securely with double-sided non-slip rug tape. ?Do not have throw rugs and other things on the floor that can make you trip. ?What can I do in the bedroom? ?Use night lights. ?Make sure  that you have a light by your bed that is easy to reach. ?Do not use any sheets or blankets that are too big for your bed. They should not hang down onto the floor. ?Have a firm chair that has side arms. You can use this for support while you get dressed. ?Do not have throw rugs and other things on the floor that can make you trip. ?What can I do in the kitchen? ?Clean up any spills right away. ?Avoid walking on wet floors. ?Keep items that you use a lot in easy-to-reach places. ?If you need to reach something above you, use a strong step stool that has a grab bar. ?Keep electrical cords out of the way. ?Do not use floor polish or wax that makes floors slippery. If you must use wax, use non-skid floor wax. ?Do not have throw rugs and other things on the floor that can make you trip. ?What can I do with my stairs? ?Do not leave any items on the stairs. ?Make sure that there are handrails on both sides of the stairs and use them. Fix handrails that are broken or loose. Make sure that handrails are as long as the stairways. ?Check any carpeting to make sure that it is firmly attached to the stairs. Fix any carpet that is loose or worn. ?Avoid having throw rugs at the top or bottom of the  stairs. If you do have throw rugs, attach them to the floor with carpet tape. ?Make sure that you have a light switch at the top of the stairs and the bottom of the stairs. If you do not have them, ask someone to add them for you. ?What else can I do to help prevent falls? ?Wear shoes that: ?Do not have high heels. ?Have rubber bottoms. ?Are comfortable and fit you well. ?Are closed at the toe. Do not wear sandals. ?If you use a stepladder: ?Make sure that it is fully opened. Do not climb a closed stepladder. ?Make sure that both sides of the stepladder are locked into place. ?Ask someone to hold it for you, if possible. ?Clearly mark and make sure that you can see: ?Any grab bars or handrails. ?First and last steps. ?Where the edge of  each step is. ?Use tools that help you move around (mobility aids) if they are needed. These include: ?Canes. ?Walkers. ?Scooters. ?Crutches. ?Turn on the lights when you go into a dark area. Replace any lig

## 2022-01-16 NOTE — Progress Notes (Signed)
? ?Subjective:  ? [name] is a 88 y.o. female who presents for Medicare Annual (Subsequent) preventive examination. ? ?I connected with  [name] on 01/16/22 by a audio enabled telemedicine application and verified that I am speaking with the correct person using two identifiers. ? ?Patient Location: Home ? ?Provider Location: Office/Clinic ? ?I discussed the limitations of evaluation and management by telemedicine. The patient expressed understanding and agreed to proceed.  ? ?Review of Systems    ? ?Cardiac Risk Factors include: advanced age (>76mn, >>41women);hypertension;dyslipidemia ? ?   ?Objective:  ?  ?There were no vitals filed for this visit. ?There is no height or weight on file to calculate BMI. ? ? ?  01/16/2022  ? 10:23 AM 09/30/2021  ?  6:41 PM 01/10/2021  ? 11:04 AM 12/21/2020  ?  8:03 AM 12/13/2020  ?  8:37 AM 12/03/2020  ? 10:44 AM 11/26/2020  ?  8:49 AM  ?Advanced Directives  ?Does Patient Have a Medical Advance Directive? Yes Yes _0   ?Type of AParamedicof ALoomisOut of facility DNR (pink MOST or yellow form);Living will Healthcare Power of Attorney       ?Does patient want to make changes to medical advance directive?  Yes (ED - Information included in AVS)       ?Copy of [place] Chart? Yes - validated most recent copy scanned in chart (See row information) No - copy requested       ?Would patient like information on creating a medical advance directive?   No - Patient declined No - Patient declined  No - Patient declined No - Patient declined  ? ? ?Current Medications (verified) ?Outpatient Encounter Medications as of 01/16/2022  ?Medication Sig  ? amLODipine (NORVASC) 2.5 MG tablet Take 1 tablet (2.5 mg total) by mouth daily.  ? azithromycin (ZITHROMAX Z-PAK) 250 MG tablet 2 po day one, then 1 daily x 4 days  ? Calcium Carbonate-Vitamin D (CALCIUM + D PO) Take 1 capsule by mouth daily.   ? cephALEXin (KEFLEX) 500 MG capsule Take  1 capsule (500 mg total) by mouth 3 (three) times daily.  ? COVID-19 mRNA bivalent vaccine, Pfizer, (PFIZER COVID-19 VAC BIVALENT) injection Inject into the muscle.  ? latanoprost (XALATAN) 0.005 % ophthalmic solution Place 1 drop into both eyes at bedtime.   ? levothyroxine (SYNTHROID) 75 MCG tablet TAKE 1 TABLET BY MOUTH DAILY BEFORE BREAKFAST  ? Multiple Vitamin (MULTIVITAMIN WITH MINERALS) TABS tablet Take 1 tablet by mouth daily. One a day  ? rosuvastatin (CRESTOR) 10 MG tablet Take 1 tablet (10 mg total) by mouth daily. TAKE 1 BY MOUTH DAILY  ? ?No facility-administered encounter medications on file as of 01/16/2022.  ? ? ?Allergies (verified) ?Daypro [oxaprozin] and Prolia [denosumab]  ? ?History: ?Past Medical History:  ?Diagnosis Date  ? Acute blood loss anemia   ? AKI (acute kidney injury) ([place]   ? Autoimmune disease ([place]   ? Back pain 05/04/2020  ? Barrett's esophagus   ? Basal cell carcinoma (BCC) in situ of skin 04/04/2020  ? Path 03/2020, [facility]  ? Bone lesion   ? sacrum  ? Breast cancer ([place] 2010  ? cT1 N0 M0; ER+/ PR+/ HER2 neg; s/p lumpectomy 07/2010; started adjuvant hormonal therapy 10/2009  ? Cellulitis of skin 12/31/2020  ? Cervical cancer ([place]   ? Chronic midline thoracic back pain 12/31/2020  ? Diarrhea 09/08/2017  ?  Dizziness 02/22/2019  ? Dysphagia   ? Elevated serum creatinine 09/08/2017  ? Esophageal spasm 09/20/2013  ? Essential hypertension 03/21/2019  ? Fall at home, initial encounter 04/29/2020  ? Fracture, shoulder 03/06/2012  ? [facility] by Dr. Iran Planas on 03/09/2012 and 03/11/2012.   X-rays taken of left wrist, left elbow, and left shoulder.   Shoulder Fx: nondisplaced proximal humerus fracture involving the greater tuberosity. Wrist: consistent with an old distal radius fracture with shortening present.      ? Glaucoma   ? Hoarseness of voice 02/28/2016  ? Humerus fracture 03/2012  ? impacted left humueral neck fracture, treated by Dr. Amedeo Plenty  ? Hx of  meningioma of the brain 08/28/2017  ? Stable since 2010- 2018 on MRI  ? Hypercalcemia   ? Hyperlipidemia   ? Hypertension   ? Hypothyroidism   ? Intracranial hemorrhage ([place])   ? Nausea vomiting and diarrhea 09/08/2017  ? Orbital fracture, closed, initial encounter ([place]) 04/29/2020  ? Osteoporosis 03/18/2012  ? Paroxysmal SVT (supraventricular tachycardia) ([place]) 12/23/2020  ? Pneumonia   ? PVC (premature ventricular contraction) 09/08/2017  ? Raynaud's phenomenon 02/28/2016  ? SAH (subarachnoid hemorrhage) ([place]) 04/29/2020  ? Squamous cell carcinoma in situ of skin 11/30/2018  ? Path 11/1008, Dermatology managing   ? Stage 3a chronic kidney disease   ? Systolic murmur 8/59/2924  ? TBI (traumatic brain injury) ([place]) 05/04/2020  ? ?Past Surgical History:  ?Procedure Laterality Date  ? ABDOMINAL HYSTERECTOMY  1972  ? cancer          1 tube 1 ovary  ? APPENDECTOMY    ? BACK SURGERY  11/28/2020  ? BREAST LUMPECTOMY Left 2010  ? colon polyp removal    ? EYE SURGERY    ? HAND SURGERY    ? KYPHOPLASTY N/A 11/28/2020  ? Procedure: KYPHOPLASTY THORACIC TWELVE AND LUMBAR ONE;  Surgeon: Melina Schools, MD;  Location: [facility];  Service: Orthopedics;  Laterality: N/A;  90 mins  ? ortho procedures    ? multiple  ? OVARIAN CYST REMOVAL  1978  ? [place]  ? left  ? ?Family History  ?Problem Relation Age of Onset  ? Heart disease Father   ? Cancer Sister   ?     lung, kidney  ? Cancer Brother   ?     brain  ? Cancer Paternal Aunt   ?     breast  ? Breast cancer Paternal Aunt   ? Cancer Brother   ?     liver, lung  ? Osteoporosis Neg Hx   ? ?Social History  ? ?Socioeconomic History  ? Marital status: Married  ?  Spouse name: Not on file  ? Number of children: 2  ? Years of education: 74  ? Highest education level: Not on file  ?Occupational History  ? Occupation: retired  ?Tobacco Use  ? Smoking status: Former  ?  Types: Cigarettes  ?  Quit date: 12/16/1986  ?  Years since quitting: 35.1  ? Smokeless tobacco: Never  ? Tobacco  comments:  ?  FORMER SMOKER 30 YEARS AGO  ?Vaping Use  ? Vaping Use: Never used  ?Substance and Sexual Activity  ? Alcohol use: Yes  ?  Alcohol/week: 0.0 standard drinks  ?  Comment: OCCASIONALLY  ? Drug use: No  ? Sexual activity: Not on file  ?Other Topics Concern  ? Not on file  ?Social History Narrative  ? Married  ?  Exercise: Yes  ? Patient lives with husband in a one story home.  Has 2 daughters.  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  Education: high school.    ? Right handed   ? ?Social Determinants of Health  ? ?Financial Resource Strain: Not on file  ?Food Insecurity: Not on file  ?Transportation Needs: Not on file  ?Physical Activity: Not on file  ?Stress: Not on file  ?Social Connections: Not on file  ? ? ?Tobacco Counseling ?Counseling given: Not Answered ?Tobacco comments: FORMER SMOKER 30 YEARS AGO ? ? ?Clinical Intake: ? ?Pre-visit preparation completed: Yes ? ?Pain : No/denies pain ? ?  ? ?Nutritional Risks: None ?Diabetes: No ? ?How often do you need to have someone help you when you read instructions, pamphlets, or other written materials from your doctor or pharmacy?: 1 - Never ? ?Diabetic?No ? ?Interpreter Needed?: No ? ?Information entered by ::   ? ? ?Activities of Daily Living ? ?  01/16/2022  ? 10:25 AM  ?In your present state of health, do you have any difficulty performing the following activities:  ?Hearing? 0  ?Vision? 0  ?Difficulty concentrating or making decisions? 0  ?Walking or climbing stairs? 1  ?Dressing or bathing? 0  ?Doing errands, shopping? 0  ?Preparing Food and eating ? N  ?Using the Toilet? N  ?In the past six months, have you accidently leaked urine? N  ?Do you have problems with loss of bowel control? N  ?Managing your Medications? N  ?Managing your Finances? N  ?Housekeeping or managing your Housekeeping? N  ? ? ?Patient Care Team: ?Copland, Gay Filler, MD as PCP - General (Family Medicine) ?Buford Dresser, MD as PCP - Cardiology (Cardiology) ?[place], Peter  M, MD (Cardiology) ?Roseanne Kaufman, MD (Orthopedic Surgery) ?Particia Nearing, MD (Dermatology) ?Heath Lark, MD as Consulting Physician (Hematology and Oncology) ?Alda Berthold, DO as Consulting Physician

## 2022-01-24 NOTE — Progress Notes (Addendum)
Therapist, music at [employer] ?[address], [address] ?[place], [place] [address] ?[phone] ?Fax [phone] ? ?Date:  01/29/2022  ? ?Name:  [name]   DOB:  [birth year]   MRN:  [id number] ? ?PCP:  Lori Mclean, MD  ? ? ?Chief Complaint: 4-6 month follow up (Concerns/ questions: More skin cancers on her left leg) ? ? ?History of Present Illness: ? ?[name] is a 88 y.o. very pleasant female patient who presents with the following: ? ?Pt seen today for periodic follow-up ?Last seen by myself in December  ?History of hypertension, hyperlipidemia, hypothyroidism, breast cancer, more recently some difficulty with paranoia and anxiety ?Also, in August 2021 she had a fall and sustained an orbital fracture, subarachnoid hemorrhage, and thoracic compression fracture.  She had a kyphoplasty in February of 2022 ? ?CMP in December  ?Mild leukocytosis in December on CBC ?Mammo UTD  ? ?IUTD  ?She reports no falls since 8/21 ?She is walking for exercise- she enjoys this even more when  the weather is nice  ?She likes to walk daily and will go about 3 miles  ?Married to [name]  ? ?She has some skin cancers on her left leg- being treated by dermatology at [place] per pt report  ? ?Wt Readings from Last 3 Encounters:  ?01/29/22 113 lb 3.2 oz (51.3 kg)  ?09/30/21 114 lb 9.5 oz (52 kg)  ?09/30/21 114 lb 9.6 oz (52 kg)  ? ?[name] notes she is feeling well, good appetite and sleeping well.  She is happy  ? ?Patient Active Problem List  ? Diagnosis Date Noted  ? Autoimmune disease ([place]) 07/31/2021  ? Barrett's esophagus 07/31/2021  ? Bone lesion 07/31/2021  ? Cervical cancer ([place]) 07/31/2021  ? Dysphagia 07/31/2021  ? Hypertension 07/31/2021  ? Hypothyroidism 07/31/2021  ? Pneumonia 07/31/2021  ? Chronic midline thoracic back pain 12/31/2020  ? Cellulitis of skin 12/31/2020  ? Paroxysmal SVT (supraventricular tachycardia) ([place]) 12/23/2020  ? Intracranial hemorrhage ([place])   ? Acute blood  loss anemia   ? TBI (traumatic brain injury) ([place]) 05/04/2020  ? Back pain 05/04/2020  ? SAH (subarachnoid hemorrhage) ([place]) 04/29/2020  ? Fall at home, initial encounter 04/29/2020  ? Orbital fracture, closed, initial encounter ([place]) 04/29/2020  ? Basal cell carcinoma (BCC) in situ of skin 04/04/2020  ? Essential hypertension 03/21/2019  ? Stage 3a chronic kidney disease   ? Systolic murmur 67/67/2094  ? Hypercalcemia   ? AKI (acute kidney injury) ([place])   ? Dizziness 02/22/2019  ? Squamous cell carcinoma in situ of skin 11/30/2018  ? Diarrhea 09/08/2017  ? Nausea vomiting and diarrhea 09/08/2017  ? Elevated serum creatinine 09/08/2017  ? PVC (premature ventricular contraction) 09/08/2017  ? Glaucoma 09/08/2017  ? Hx of meningioma of the brain 08/28/2017  ? Hoarseness of voice 02/28/2016  ? Raynaud's phenomenon 02/28/2016  ? Esophageal spasm 09/20/2013  ? Osteoporosis 03/18/2012  ? Humerus fracture 03/2012  ? Fracture, shoulder 03/06/2012  ? Hyperlipidemia   ? Breast cancer ([place])   ? ? ?Past Medical History:  ?Diagnosis Date  ? Acute blood loss anemia   ? AKI (acute kidney injury) ([place])   ? Autoimmune disease ([place])   ? Back pain 05/04/2020  ? Barrett's esophagus   ? Basal cell carcinoma (BCC) in situ of skin 04/04/2020  ? Path 03/2020, [facility]   ? Bone lesion   ? sacrum  ? Breast cancer (  [place]) 2010  ? cT1 N0 M0; ER+/ PR+/ HER2 neg; s/p lumpectomy 07/2010; started adjuvant hormonal therapy 10/2009  ? Cellulitis of skin 12/31/2020  ? Cervical cancer ([place])   ? Chronic midline thoracic back pain 12/31/2020  ? Diarrhea 09/08/2017  ? Dizziness 02/22/2019  ? Dysphagia   ? Elevated serum creatinine 09/08/2017  ? Esophageal spasm 09/20/2013  ? Essential hypertension 03/21/2019  ? Fall at home, initial encounter 04/29/2020  ? Fracture, shoulder 03/06/2012  ? Pt seen at [facility] by Dr. Iran Planas on 03/09/2012 and 03/11/2012.   X-rays taken of left wrist, left elbow, and left shoulder.   Shoulder Fx: nondisplaced  proximal humerus fracture involving the greater tuberosity. Wrist: consistent with an old distal radius fracture with shortening present.      ? Glaucoma   ? Hoarseness of voice 02/28/2016  ? Humerus fracture 03/2012  ? impacted left humueral neck fracture, treated by Dr. Amedeo Plenty  ? Hx of meningioma of the brain 08/28/2017  ? Stable since 2010- 2018 on MRI  ? Hypercalcemia   ? Hyperlipidemia   ? Hypertension   ? Hypothyroidism   ? Intracranial hemorrhage ([place])   ? Nausea vomiting and diarrhea 09/08/2017  ? Orbital fracture, closed, initial encounter ([place]) 04/29/2020  ? Osteoporosis 03/18/2012  ? Paroxysmal SVT (supraventricular tachycardia) ([place]) 12/23/2020  ? Pneumonia   ? PVC (premature ventricular contraction) 09/08/2017  ? Raynaud's phenomenon 02/28/2016  ? SAH (subarachnoid hemorrhage) ([place]) 04/29/2020  ? Squamous cell carcinoma in situ of skin 11/30/2018  ? Path 11/1008, Dermatology managing   ? Stage 3a chronic kidney disease   ? Systolic murmur 11/08/5168  ? TBI (traumatic brain injury) ([place]) 05/04/2020  ? ? ?Past Surgical History:  ?Procedure Laterality Date  ? ABDOMINAL HYSTERECTOMY  1972  ? cancer          1 tube 1 ovary  ? APPENDECTOMY    ? BACK SURGERY  11/28/2020  ? BREAST LUMPECTOMY Left 2010  ? colon polyp removal    ? EYE SURGERY    ? HAND SURGERY    ? KYPHOPLASTY N/A 11/28/2020  ? Procedure: KYPHOPLASTY THORACIC TWELVE AND LUMBAR ONE;  Surgeon: Melina Schools, MD;  Location: [facility];  Service: Orthopedics;  Laterality: N/A;  90 mins  ? ortho procedures    ? multiple  ? OVARIAN CYST REMOVAL  1978  ? [place]  ? left  ? ? ?Social History  ? ?Tobacco Use  ? Smoking status: Former  ?  Types: Cigarettes  ?  Quit date: 12/16/1986  ?  Years since quitting: 35.1  ? Smokeless tobacco: Never  ? Tobacco comments:  ?  FORMER SMOKER 30 YEARS AGO  ?Vaping Use  ? Vaping Use: Never used  ?Substance Use Topics  ? Alcohol use: Yes  ?  Alcohol/week: 0.0 standard drinks  ?  Comment: OCCASIONALLY  ? Drug use: No   ? ? ?Family History  ?Problem Relation Age of Onset  ? Heart disease Father   ? Cancer Sister   ?     lung, kidney  ? Cancer Brother   ?     brain  ? Cancer Paternal Aunt   ?     breast  ? Breast cancer Paternal Aunt   ? Cancer Brother   ?     liver, lung  ? Osteoporosis Neg Hx   ? ? ?Allergies  ?Allergen Reactions  ? Daypro [Oxaprozin] Swelling  ?  Face and tongue  ?  Prolia [Denosumab]   ?  unknown  ? ? ?Medication list has been reviewed and updated. ? ?Current Outpatient Medications on File Prior to Visit  ?Medication Sig Dispense Refill  ? amLODipine (NORVASC) 2.5 MG tablet Take 1 tablet (2.5 mg total) by mouth daily. 90 tablet 3  ? Calcium Carbonate-Vitamin D (CALCIUM + D PO) Take 1 capsule by mouth daily.     ? latanoprost (XALATAN) 0.005 % ophthalmic solution Place 1 drop into both eyes at bedtime.     ? levothyroxine (SYNTHROID) 75 MCG tablet TAKE 1 TABLET BY MOUTH DAILY BEFORE BREAKFAST 90 tablet 3  ? Multiple Vitamin (MULTIVITAMIN WITH MINERALS) TABS tablet Take 1 tablet by mouth daily. One a day    ? rosuvastatin (CRESTOR) 10 MG tablet Take 1 tablet (10 mg total) by mouth daily. TAKE 1 BY MOUTH DAILY 90 tablet 3  ? ?No current facility-administered medications on file prior to visit.  ? ? ?Review of Systems: ? ?As per HPI- otherwise negative. ? ? ?Physical Examination: ?Vitals:  ? 01/29/22 0829  ?BP: 122/80  ?Pulse: 72  ?Resp: 18  ?Temp: 97.6 ?F (36.4 ?C)  ?SpO2: 99%  ? ?Vitals:  ? 01/29/22 0829  ?Weight: 113 lb 3.2 oz (51.3 kg)  ?Height: _0  (1.6 m)  ? ?Body mass index is 20.05 kg/m?. ?Ideal Body Weight: Weight in (lb) to have BMI = 25: 140.8 ? ?GEN: no acute distress.  Slight build, looks well  ?HEENT: Atraumatic, Normocephalic.  ?Ears and Nose: No external deformity. ?CV: noted some irregularity today, No M/G/R. No JVD. No thrill. No extra heart sounds. ?PULM: CTA B, no wheezes, crackles, rhonchi. No retractions. No resp. distress. No accessory muscle use. ?EXTR: No c/c/e ?PSYCH: Normally  interactive. Conversant.  ?Skin cancer bx sites on left leg are healing normally  ? ?EKG: compared with tracing 12/22 no significant change is noted  ?Sinus rhythm with PAC and PVC.   ?Assessment and Plan: ?Acquired hypo

## 2022-01-24 NOTE — Patient Instructions (Addendum)
Good to see you today- I will be in touch with your labs ?Assuming all is well please see me in 6 months  ?We refilled your BP med and cholesterol med today  ?

## 2022-01-29 ENCOUNTER — Encounter: Payer: Self-pay | Admitting: Family Medicine

## 2022-01-29 ENCOUNTER — Ambulatory Visit (INDEPENDENT_AMBULATORY_CARE_PROVIDER_SITE_OTHER): Payer: Medicare Other | Admitting: Family Medicine

## 2022-01-29 VITALS — BP 122/80 | HR 72 | Temp 97.6°F | Resp 18 | Ht 63.0 in | Wt 113.2 lb

## 2022-01-29 DIAGNOSIS — R0989 Other specified symptoms and signs involving the circulatory and respiratory systems: Secondary | ICD-10-CM | POA: Diagnosis not present

## 2022-01-29 DIAGNOSIS — E782 Mixed hyperlipidemia: Secondary | ICD-10-CM | POA: Diagnosis not present

## 2022-01-29 DIAGNOSIS — E039 Hypothyroidism, unspecified: Secondary | ICD-10-CM | POA: Diagnosis not present

## 2022-01-29 DIAGNOSIS — I10 Essential (primary) hypertension: Secondary | ICD-10-CM

## 2022-01-29 LAB — CBC
HCT: 38.7 % (ref 36.0–46.0)
Hemoglobin: 12.6 g/dL (ref 12.0–15.0)
MCHC: 32.6 g/dL (ref 30.0–36.0)
MCV: 92.8 fl (ref 78.0–100.0)
Platelets: 219 10*3/uL (ref 150.0–400.0)
RBC: 4.17 Mil/uL (ref 3.87–5.11)
RDW: 15.7 % — ABNORMAL HIGH (ref 11.5–15.5)
WBC: 7.4 10*3/uL (ref 4.0–10.5)

## 2022-01-29 LAB — BASIC METABOLIC PANEL
BUN: 22 mg/dL (ref 6–23)
CO2: 30 mEq/L (ref 19–32)
Calcium: 9.7 mg/dL (ref 8.4–10.5)
Chloride: 102 mEq/L (ref 96–112)
Creatinine, Ser: 1.04 mg/dL (ref 0.40–1.20)
GFR: 47.89 mL/min — ABNORMAL LOW (ref >60.00)
Glucose, Bld: 98 mg/dL (ref 70–99)
Potassium: 4.8 mEq/L (ref 3.5–5.1)
Sodium: 139 mEq/L (ref 135–145)

## 2022-01-29 LAB — TSH: TSH: 3.16 u[IU]/mL (ref 0.35–5.50)

## 2022-01-29 MED ORDER — AMLODIPINE BESYLATE 2.5 MG PO TABS: 2.5000 mg | ORAL_TABLET | Freq: Every day | ORAL | 3 refills | Status: DC

## 2022-01-29 MED ORDER — ROSUVASTATIN CALCIUM 10 MG PO TABS: 10.0000 mg | ORAL_TABLET | Freq: Every day | ORAL | 3 refills | Status: DC

## 2022-02-14 DIAGNOSIS — H02052 Trichiasis without entropian right lower eyelid: Secondary | ICD-10-CM | POA: Diagnosis not present

## 2022-02-14 DIAGNOSIS — H401132 Primary open-angle glaucoma, bilateral, moderate stage: Secondary | ICD-10-CM | POA: Diagnosis not present

## 2022-02-14 DIAGNOSIS — H0102A Squamous blepharitis right eye, upper and lower eyelids: Secondary | ICD-10-CM | POA: Diagnosis not present

## 2022-02-18 DIAGNOSIS — L570 Actinic keratosis: Secondary | ICD-10-CM | POA: Diagnosis not present

## 2022-02-18 DIAGNOSIS — Z85828 Personal history of other malignant neoplasm of skin: Secondary | ICD-10-CM | POA: Diagnosis not present

## 2022-02-18 DIAGNOSIS — L820 Inflamed seborrheic keratosis: Secondary | ICD-10-CM | POA: Diagnosis not present

## 2022-02-18 DIAGNOSIS — L821 Other seborrheic keratosis: Secondary | ICD-10-CM | POA: Diagnosis not present

## 2022-02-25 ENCOUNTER — Encounter: Payer: Self-pay | Admitting: Cardiology

## 2022-02-25 ENCOUNTER — Ambulatory Visit: Payer: Medicare Other | Admitting: Cardiology

## 2022-02-25 VITALS — BP 148/78 | HR 86 | Ht 64.6 in | Wt 112.0 lb

## 2022-02-25 DIAGNOSIS — I10 Essential (primary) hypertension: Secondary | ICD-10-CM

## 2022-02-25 DIAGNOSIS — E782 Mixed hyperlipidemia: Secondary | ICD-10-CM

## 2022-02-25 DIAGNOSIS — I471 Supraventricular tachycardia: Secondary | ICD-10-CM

## 2022-02-25 NOTE — Progress Notes (Signed)
?Cardiology Office Note:   ? ?Date:  02/25/2022  ? ?ID:  [name], DOB 1931-10-05, MRN [id number] ? ?PCP:  Darreld Mclean, MD  ?Cardiologist:  Jenean Lindau, MD  ? ?Referring MD: Darreld Mclean, MD  ? ? ?ASSESSMENT:   ? ?1. Essential hypertension   ?2. Paroxysmal SVT (supraventricular tachycardia) (HCC)   ?3. Mixed hyperlipidemia   ? ?PLAN:   ? ?In order of problems listed above: ? ?Primary prevention stressed to the patient.  Importance of compliance with diet and medication stressed and she vocalized understanding.  She walks EKG appropriately on a regular basis and congratulated her about this.   ?Essential hypertension: Blood pressure stable and diet was emphasized.  She has an element of whitecoat hypertension and blood pressures are stable at home. ?Mixed dyslipidemia: Diet was emphasized.  Lifestyle modification urged.  Lipids followed by primary care and she is going to get blood work done in the next few weeks. ?Paroxysmal SVT: Stable at this time.  No palpitations or any such symptoms and she is happy about it. ?Patient will be seen in follow-up appointment in 12 months or earlier if the patient has any concerns ? ? ? ?Medication Adjustments/Labs and Tests Ordered: ?Current medicines are reviewed at length with the patient today.  Concerns regarding medicines are outlined above.  ?No orders of the defined types were placed in this encounter. ? ?No orders of the defined types were placed in this encounter. ? ? ? ?No chief complaint on file. ?  ? ?History of Present Illness:   ? ?[name] is a 88 y.o. female.  Patient has past medical history of supraventricular tachycardia paroxysmal in nature, essential hypertension and mixed dyslipidemia.  She denies any problems at this time and takes care of activities of daily living.  No chest pain orthopnea or PND.  She exercises on a regular basis.  At the time of my evaluation, the patient is alert awake oriented and in no distress. ? ?Past  Medical History:  ?Diagnosis Date  ? Acute blood loss anemia   ? AKI (acute kidney injury) ([place])   ? Autoimmune disease ([place])   ? Back pain 05/04/2020  ? Barrett's esophagus   ? Basal cell carcinoma (BCC) in situ of skin 04/04/2020  ? Path 03/2020, [facility]   ? Bone lesion   ? sacrum  ? Breast cancer ([place]) 2010  ? cT1 N0 M0; ER+/ PR+/ HER2 neg; s/p lumpectomy 07/2010; started adjuvant hormonal therapy 10/2009  ? Cellulitis of skin 12/31/2020  ? Cervical cancer ([place])   ? Chronic midline thoracic back pain 12/31/2020  ? Diarrhea 09/08/2017  ? Dizziness 02/22/2019  ? Dysphagia   ? Elevated serum creatinine 09/08/2017  ? Esophageal spasm 09/20/2013  ? Essential hypertension 03/21/2019  ? Fall at home, initial encounter 04/29/2020  ? Fracture, shoulder 03/06/2012  ? Pt seen at [facility] by Dr. Iran Planas on 03/09/2012 and 03/11/2012.   X-rays taken of left wrist, left elbow, and left shoulder.   Shoulder Fx: nondisplaced proximal humerus fracture involving the greater tuberosity. Wrist: consistent with an old distal radius fracture with shortening present.      ? Glaucoma   ? Hoarseness of voice 02/28/2016  ? Humerus fracture 03/2012  ? impacted left humueral neck fracture, treated by Dr. Amedeo Plenty  ? Hx of meningioma of the brain 08/28/2017  ? Stable since 2010- 2018 on MRI  ? Hypercalcemia   ? Hyperlipidemia   ? Hypertension   ?  Hypothyroidism   ? Intracranial hemorrhage ([place])   ? Nausea vomiting and diarrhea 09/08/2017  ? Orbital fracture, closed, initial encounter ([place]) 04/29/2020  ? Osteoporosis 03/18/2012  ? Paroxysmal SVT (supraventricular tachycardia) ([place]) 12/23/2020  ? Pneumonia   ? PVC (premature ventricular contraction) 09/08/2017  ? Raynaud's phenomenon 02/28/2016  ? SAH (subarachnoid hemorrhage) ([place]) 04/29/2020  ? Squamous cell carcinoma in situ of skin 11/30/2018  ? Path 11/1008, Dermatology managing   ? Stage 3a chronic kidney disease   ? Systolic murmur 0/99/8338  ? TBI (traumatic brain injury) ([place])  05/04/2020  ? ? ?Past Surgical History:  ?Procedure Laterality Date  ? ABDOMINAL HYSTERECTOMY  1972  ? cancer          1 tube 1 ovary  ? APPENDECTOMY    ? BACK SURGERY  11/28/2020  ? BREAST LUMPECTOMY Left 2010  ? colon polyp removal    ? EYE SURGERY    ? HAND SURGERY    ? KYPHOPLASTY N/A 11/28/2020  ? Procedure: KYPHOPLASTY THORACIC TWELVE AND LUMBAR ONE;  Surgeon: Melina Schools, MD;  Location: [facility];  Service: Orthopedics;  Laterality: N/A;  90 mins  ? ortho procedures    ? multiple  ? OVARIAN CYST REMOVAL  1978  ? [place]  ? left  ? ? ?Current Medications: ?Current Meds  ?Medication Sig  ? amLODipine (NORVASC) 2.5 MG tablet Take 1 tablet (2.5 mg total) by mouth daily.  ? Calcium Carbonate-Vitamin D (CALCIUM + D PO) Take 1 capsule by mouth daily.   ? latanoprost (XALATAN) 0.005 % ophthalmic solution Place 1 drop into both eyes at bedtime.   ? levothyroxine (SYNTHROID) 75 MCG tablet TAKE 1 TABLET BY MOUTH DAILY BEFORE BREAKFAST  ? Multiple Vitamin (MULTIVITAMIN WITH MINERALS) TABS tablet Take 1 tablet by mouth daily. One a day  ? rosuvastatin (CRESTOR) 10 MG tablet Take 1 tablet (10 mg total) by mouth daily. TAKE 1 BY MOUTH DAILY  ?  ? ?Allergies:   Daypro [oxaprozin] and Prolia [denosumab]  ? ?Social History  ? ?Socioeconomic History  ? Marital status: Married  ?  Spouse name: Not on file  ? Number of children: 2  ? Years of education: 76  ? Highest education level: Not on file  ?Occupational History  ? Occupation: retired  ?Tobacco Use  ? Smoking status: Former  ?  Types: Cigarettes  ?  Quit date: 12/16/1986  ?  Years since quitting: 35.2  ? Smokeless tobacco: Never  ? Tobacco comments:  ?  FORMER SMOKER 30 YEARS AGO  ?Vaping Use  ? Vaping Use: Never used  ?Substance and Sexual Activity  ? Alcohol use: Yes  ?  Alcohol/week: 0.0 standard drinks  ?  Comment: OCCASIONALLY  ? Drug use: No  ? Sexual activity: Not on file  ?Other Topics Concern  ? Not on file  ?Social History Narrative  ? Married  ?  Exercise: Yes  ? Patient lives with husband in a one story home.  Has 2 daughters.  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  Education: high school.    ? Right handed   ? ?Social Determinants of Health  ? ?Financial Resource Strain: Low Risk   ? Difficulty of Paying Living Expenses: Not hard at all  ?Food Insecurity: No Food Insecurity  ? Worried About Charity fundraiser in the Last Year: Never true  ? Ran Out of Food in the Last Year: Never true  ?Transportation Needs: No Transportation Needs  ?  Lack of Transportation (Medical): No  ? Lack of Transportation (Non-Medical): No  ?Physical Activity: Not on file  ?Stress: No Stress Concern Present  ? Feeling of Stress : Not at all  ?Social Connections: Socially Integrated  ? Frequency of Communication with Friends and Family: More than three times a week  ? Frequency of Social Gatherings with Friends and Family: Never  ? Attends Religious Services: More than 4 times per year  ? Active Member of Clubs or Organizations: Yes  ? Attends Archivist Meetings: Never  ? Marital Status: Married  ?  ? ?Family History: ?The patient's family history includes Breast cancer in her paternal aunt; Cancer in her brother, brother, paternal aunt, and sister; Heart disease in her father. There is no history of Osteoporosis. ? ?ROS:   ?Please see the history of present illness.    ?All other systems reviewed and are negative. ? ?EKGs/Labs/Other Studies Reviewed:   ? ?The following studies were reviewed today: ?EKG reveals sinus rhythm and nonspecific ST-T changes ? ? ?Recent Labs: ?09/30/2021: ALT 28 ?01/29/2022: BUN 22; Creatinine, Ser 1.04; Hemoglobin 12.6; Platelets 219.0; Potassium 4.8; Sodium 139; TSH 3.16  ?Recent Lipid Panel ?   ?Component Value Date/Time  ? CHOL 169 05/23/2019 0919  ? TRIG 50.0 05/23/2019 0919  ? HDL 78.70 05/23/2019 0919  ? CHOLHDL 2 05/23/2019 0919  ? VLDL 10.0 05/23/2019 0919  ? [place] 80 05/23/2019 0919  ? ? ?Physical Exam:   ? ?VS:  BP (!) 148/78   Pulse  86   Ht 5' 4.6" (1.641 m)   Wt 112 lb 0.6 oz (50.8 kg)   SpO2 97%   BMI 18.88 kg/m?    ? ?Wt Readings from Last 3 Encounters:  ?02/25/22 112 lb 0.6 oz (50.8 kg)  ?01/29/22 113 lb 3.2 oz (51.3 kg)  ?09/30/21 114 lb 9

## 2022-02-25 NOTE — Patient Instructions (Signed)

## 2022-02-27 ENCOUNTER — Ambulatory Visit: Payer: Medicare Other | Admitting: Cardiology

## 2022-04-17 ENCOUNTER — Ambulatory Visit: Payer: Medicare Other | Admitting: Cardiology

## 2022-07-03 ENCOUNTER — Ambulatory Visit: Payer: Medicare Other | Admitting: Family Medicine

## 2022-07-11 DIAGNOSIS — H401132 Primary open-angle glaucoma, bilateral, moderate stage: Secondary | ICD-10-CM | POA: Diagnosis not present

## 2022-07-11 DIAGNOSIS — D23121 Other benign neoplasm of skin of left upper eyelid, including canthus: Secondary | ICD-10-CM | POA: Diagnosis not present

## 2022-07-11 DIAGNOSIS — H5712 Ocular pain, left eye: Secondary | ICD-10-CM | POA: Diagnosis not present

## 2022-07-11 DIAGNOSIS — H0102A Squamous blepharitis right eye, upper and lower eyelids: Secondary | ICD-10-CM | POA: Diagnosis not present

## 2022-07-27 NOTE — Progress Notes (Unsigned)
[place] [facility] [address], [place], [place] [address] [phone]  Date:  07/31/2022   Name:  [name]   DOB:  11/30/1930   MRN:  [id number]  PCP:  Darreld Mclean, MD    Chief Complaint: No chief complaint on file.   History of Present Illness:  [name] is a 88 y.o. very pleasant female patient who presents with the following:  Pt seen today for a periodic recheck Last seen by myself in April  History of hypertension, hyperlipidemia, hypothyroidism, breast cancer, more recently some difficulty with paranoia and anxiety Also, in August 2021 she had a fall and sustained an orbital fracture, subarachnoid hemorrhage, and thoracic compression fracture.  She had a kyphoplasty in February of 2022  She enjoys walking for exercise Lives with her husband [name]   Flu Covid vaccine Labs: can be updated Mammo- due next month Dexa- can be updated   Crestor Levothyroxine 75 Amlodipine 2.5   Patient Active Problem List   Diagnosis Date Noted   Autoimmune disease ([place]) 07/31/2021   Barrett's esophagus 07/31/2021   Bone lesion 07/31/2021   Cervical cancer ([place]) 07/31/2021   Dysphagia 07/31/2021   Hypertension 07/31/2021   Hypothyroidism 07/31/2021   Pneumonia 07/31/2021   Chronic midline thoracic back pain 12/31/2020   Cellulitis of skin 12/31/2020   Paroxysmal SVT (supraventricular tachycardia) 12/23/2020   Intracranial hemorrhage (HCC)    Acute blood loss anemia    TBI (traumatic brain injury) ([place]) 05/04/2020   Back pain 05/04/2020   SAH (subarachnoid hemorrhage) ([place]) 04/29/2020   Fall at home, initial encounter 04/29/2020   Orbital fracture, closed, initial encounter ([place]) 04/29/2020   Basal cell carcinoma (BCC) in situ of skin 04/04/2020   Essential hypertension 03/21/2019   Stage 3a chronic kidney disease    Systolic murmur 07/37/1062   Hypercalcemia    AKI (acute kidney injury) ([place])     Dizziness 02/22/2019   Squamous cell carcinoma in situ of skin 11/30/2018   Diarrhea 09/08/2017   Nausea vomiting and diarrhea 09/08/2017   Elevated serum creatinine 09/08/2017   PVC (premature ventricular contraction) 09/08/2017   Glaucoma 09/08/2017   Hx of meningioma of the brain 08/28/2017   Hoarseness of voice 02/28/2016   Raynaud's phenomenon 02/28/2016   Esophageal spasm 09/20/2013   Osteoporosis 03/18/2012   Humerus fracture 03/2012   Fracture, shoulder 03/06/2012   Hyperlipidemia    Breast cancer ([place])     Past Medical History:  Diagnosis Date   Acute blood loss anemia    AKI (acute kidney injury) ([place])    Autoimmune disease ([place])    Back pain 05/04/2020   Barrett's esophagus    Basal cell carcinoma (BCC) in situ of skin 04/04/2020   Path 03/2020, [facility]    Bone lesion    sacrum   Breast cancer ([place]) 2010   cT1 N0 M0; ER+/ PR+/ HER2 neg; s/p lumpectomy 07/2010; started adjuvant hormonal therapy 10/2009   Cellulitis of skin 12/31/2020   Cervical cancer (HCC)    Chronic midline thoracic back pain 12/31/2020   Diarrhea 09/08/2017   Dizziness 02/22/2019   Dysphagia    Elevated serum creatinine 09/08/2017   Esophageal spasm 09/20/2013   Essential hypertension 03/21/2019   Fall at home, initial encounter 04/29/2020   Fracture, shoulder 03/06/2012   Pt [facility] at [facility] by Dr. Iran Planas on 03/09/2012 and 03/11/2012.   X-rays taken of left  wrist, left elbow, and left shoulder.   Shoulder Fx: nondisplaced proximal humerus fracture involving the greater tuberosity. Wrist: consistent with an old distal radius fracture with shortening present.       Glaucoma    Hoarseness of voice 02/28/2016   Humerus fracture 03/2012   impacted left humueral neck fracture, treated by Dr. Amedeo Plenty   Hx of meningioma of the brain 08/28/2017   Stable since 2010- 2018 on MRI   Hypercalcemia    Hyperlipidemia    Hypertension    Hypothyroidism    Intracranial hemorrhage (HCC)     Nausea vomiting and diarrhea 09/08/2017   Orbital fracture, closed, initial encounter ([place]) 04/29/2020   Osteoporosis 03/18/2012   Paroxysmal SVT (supraventricular tachycardia) ([place]) 12/23/2020   Pneumonia    PVC (premature ventricular contraction) 09/08/2017   Raynaud's phenomenon 02/28/2016   SAH (subarachnoid hemorrhage) ([place]) 04/29/2020   Squamous cell carcinoma in situ of skin 11/30/2018   Path 11/1008, Dermatology managing    Stage 3a chronic kidney disease    Systolic murmur 0/04/6225   TBI (traumatic brain injury) ([place]) 05/04/2020    Past Surgical History:  Procedure Laterality Date   ABDOMINAL HYSTERECTOMY  1972   cancer          1 tube 1 ovary   APPENDECTOMY     BACK SURGERY  11/28/2020   BREAST LUMPECTOMY Left 2010   colon polyp removal     EYE SURGERY     HAND SURGERY     KYPHOPLASTY N/A 11/28/2020   Procedure: KYPHOPLASTY THORACIC TWELVE AND LUMBAR ONE;  Surgeon: Melina Schools, MD;  Location: [facility];  Service: Orthopedics;  Laterality: N/A;  90 mins   ortho procedures     multiple   OVARIAN CYST REMOVAL  1978   SHOULDER SURGERY  1954   left    Social History   Tobacco Use   Smoking status: Former    Types: Cigarettes    Quit date: 12/16/1986    Years since quitting: 35.6   Smokeless tobacco: Never   Tobacco comments:    FORMER SMOKER 30 YEARS AGO  Vaping Use   Vaping Use: Never used  Substance Use Topics   Alcohol use: Yes    Alcohol/week: 0.0 standard drinks of alcohol    Comment: OCCASIONALLY   Drug use: No    Family History  Problem Relation Age of Onset   Heart disease Father    Cancer Sister        lung, kidney   Cancer Brother        brain   Cancer Paternal Aunt        breast   Breast cancer Paternal Aunt    Cancer Brother        liver, lung   Osteoporosis Neg Hx     Allergies  Allergen Reactions   Daypro [Oxaprozin] Swelling    Face and tongue   Prolia [Denosumab]     unknown    Medication list has been reviewed and  updated.  Current Outpatient Medications on File Prior to Visit  Medication Sig Dispense Refill   amLODipine (NORVASC) 2.5 MG tablet Take 1 tablet (2.5 mg total) by mouth daily. 90 tablet 3   Calcium Carbonate-Vitamin D (CALCIUM + D PO) Take 1 capsule by mouth daily.      latanoprost (XALATAN) 0.005 % ophthalmic solution Place 1 drop into both eyes at bedtime.      levothyroxine (SYNTHROID) 75 MCG tablet TAKE 1 TABLET BY MOUTH  DAILY BEFORE BREAKFAST 90 tablet 3   Multiple Vitamin (MULTIVITAMIN WITH MINERALS) TABS tablet Take 1 tablet by mouth daily. One a day     rosuvastatin (CRESTOR) 10 MG tablet Take 1 tablet (10 mg total) by mouth daily. TAKE 1 BY MOUTH DAILY 90 tablet 3   No current facility-administered medications on file prior to visit.    Review of Systems:  As per HPI- otherwise negative.   Physical Examination: There were no vitals filed for this visit. There were no vitals filed for this visit. There is no height or weight on file to calculate BMI. Ideal Body Weight:    GEN: no acute distress. HEENT: Atraumatic, Normocephalic.  Ears and Nose: No external deformity. CV: RRR, No M/G/R. No JVD. No thrill. No extra heart sounds. PULM: CTA B, no wheezes, crackles, rhonchi. No retractions. No resp. distress. No accessory muscle use. ABD: S, NT, ND, +BS. No rebound. No HSM. EXTR: No c/c/e PSYCH: Normally interactive. Conversant.    Assessment and Plan: ***  Signed Lamar Blinks, MD

## 2022-07-27 NOTE — Patient Instructions (Incomplete)
It was great to see you again today, I will be in touch with your labs.  Assuming all is well please see me in about 6 months I recommend getting a dose of RSV vaccine, the new COVID 19 shot and flu shot this fall  I will call [facility] and see if they can get you in sooner for the spot on your leg We will treat with keflex antibiotic twice daily for one week for now  Please let me know if this area seems to be getting worse or if any significant bleeding.  Keep covered with a band- aid today

## 2022-07-31 ENCOUNTER — Encounter: Payer: Self-pay | Admitting: Family Medicine

## 2022-07-31 ENCOUNTER — Ambulatory Visit (INDEPENDENT_AMBULATORY_CARE_PROVIDER_SITE_OTHER): Payer: Medicare Other | Admitting: Family Medicine

## 2022-07-31 ENCOUNTER — Other Ambulatory Visit (HOSPITAL_BASED_OUTPATIENT_CLINIC_OR_DEPARTMENT_OTHER): Payer: Self-pay

## 2022-07-31 VITALS — BP 120/74 | HR 60 | Temp 97.8°F | Resp 18 | Ht 63.0 in | Wt 110.2 lb

## 2022-07-31 DIAGNOSIS — I10 Essential (primary) hypertension: Secondary | ICD-10-CM

## 2022-07-31 DIAGNOSIS — Z23 Encounter for immunization: Secondary | ICD-10-CM | POA: Diagnosis not present

## 2022-07-31 DIAGNOSIS — Z1231 Encounter for screening mammogram for malignant neoplasm of breast: Secondary | ICD-10-CM

## 2022-07-31 DIAGNOSIS — L989 Disorder of the skin and subcutaneous tissue, unspecified: Secondary | ICD-10-CM

## 2022-07-31 DIAGNOSIS — Z131 Encounter for screening for diabetes mellitus: Secondary | ICD-10-CM

## 2022-07-31 DIAGNOSIS — E782 Mixed hyperlipidemia: Secondary | ICD-10-CM

## 2022-07-31 DIAGNOSIS — E039 Hypothyroidism, unspecified: Secondary | ICD-10-CM | POA: Diagnosis not present

## 2022-07-31 DIAGNOSIS — E2839 Other primary ovarian failure: Secondary | ICD-10-CM

## 2022-07-31 LAB — TSH: TSH: 4.44 u[IU]/mL (ref 0.35–5.50)

## 2022-07-31 LAB — CBC
HCT: 41.3 % (ref 36.0–46.0)
Hemoglobin: 13.4 g/dL (ref 12.0–15.0)
MCHC: 32.6 g/dL (ref 30.0–36.0)
MCV: 93.2 fl (ref 78.0–100.0)
Platelets: 269 10*3/uL (ref 150.0–400.0)
RBC: 4.43 Mil/uL (ref 3.87–5.11)
RDW: 15.4 % (ref 11.5–15.5)
WBC: 8 10*3/uL (ref 4.0–10.5)

## 2022-07-31 LAB — COMPREHENSIVE METABOLIC PANEL
ALT: 19 U/L (ref 0–35)
AST: 26 U/L (ref 0–37)
Albumin: 4.3 g/dL (ref 3.5–5.2)
Alkaline Phosphatase: 108 U/L (ref 39–117)
BUN: 19 mg/dL (ref 6–23)
CO2: 30 mEq/L (ref 19–32)
Calcium: 10.1 mg/dL (ref 8.4–10.5)
Chloride: 101 mEq/L (ref 96–112)
Creatinine, Ser: 1.08 mg/dL (ref 0.40–1.20)
GFR: 45.61 mL/min — ABNORMAL LOW (ref >60.00)
Glucose, Bld: 92 mg/dL (ref 70–99)
Potassium: 4.4 mEq/L (ref 3.5–5.1)
Sodium: 138 mEq/L (ref 135–145)
Total Bilirubin: 0.5 mg/dL (ref 0.2–1.2)
Total Protein: 7 g/dL (ref 6.0–8.3)

## 2022-07-31 LAB — LIPID PANEL
Cholesterol: 178 mg/dL (ref 0–200)
HDL: 82.9 mg/dL (ref >39.00)
LDL Cholesterol: 78 mg/dL (ref 0–99)
NonHDL: 95.07
Total CHOL/HDL Ratio: 2
Triglycerides: 85 mg/dL (ref 0.0–149.0)
VLDL: 17 mg/dL (ref 0.0–40.0)

## 2022-07-31 LAB — HEMOGLOBIN A1C: Hgb A1c MFr Bld: 6.1 % (ref 4.6–6.5)

## 2022-07-31 MED ORDER — CEPHALEXIN 500 MG PO CAPS: 500.0000 mg | ORAL_CAPSULE | Freq: Two times a day (BID) | ORAL | 0 refills | Status: DC

## 2022-07-31 MED ORDER — COMIRNATY 30 MCG/0.3ML IM SUSY
PREFILLED_SYRINGE | INTRAMUSCULAR | 0 refills | Status: DC
  Filled 2022-07-31: qty 0.3, 1d supply, fill #0

## 2022-08-07 DIAGNOSIS — C44722 Squamous cell carcinoma of skin of right lower limb, including hip: Secondary | ICD-10-CM | POA: Diagnosis not present

## 2022-08-07 DIAGNOSIS — L821 Other seborrheic keratosis: Secondary | ICD-10-CM | POA: Diagnosis not present

## 2022-08-07 DIAGNOSIS — Z85828 Personal history of other malignant neoplasm of skin: Secondary | ICD-10-CM | POA: Diagnosis not present

## 2022-08-07 DIAGNOSIS — D485 Neoplasm of uncertain behavior of skin: Secondary | ICD-10-CM | POA: Diagnosis not present

## 2022-08-21 DIAGNOSIS — D485 Neoplasm of uncertain behavior of skin: Secondary | ICD-10-CM | POA: Diagnosis not present

## 2022-08-21 DIAGNOSIS — L821 Other seborrheic keratosis: Secondary | ICD-10-CM | POA: Diagnosis not present

## 2022-08-21 DIAGNOSIS — L570 Actinic keratosis: Secondary | ICD-10-CM | POA: Diagnosis not present

## 2022-08-21 DIAGNOSIS — C44319 Basal cell carcinoma of skin of other parts of face: Secondary | ICD-10-CM | POA: Diagnosis not present

## 2022-08-21 DIAGNOSIS — Z85828 Personal history of other malignant neoplasm of skin: Secondary | ICD-10-CM | POA: Diagnosis not present

## 2022-09-15 ENCOUNTER — Encounter (HOSPITAL_BASED_OUTPATIENT_CLINIC_OR_DEPARTMENT_OTHER): Payer: Self-pay

## 2022-09-15 ENCOUNTER — Ambulatory Visit (HOSPITAL_BASED_OUTPATIENT_CLINIC_OR_DEPARTMENT_OTHER)
Admission: RE | Admit: 2022-09-15 | Discharge: 2022-09-15 | Disposition: A | Discharge status: Home / Self Care | Payer: Medicare Other | Source: Ambulatory Visit | Attending: Family Medicine | Admitting: Family Medicine

## 2022-09-15 DIAGNOSIS — E2839 Other primary ovarian failure: Secondary | ICD-10-CM | POA: Diagnosis not present

## 2022-09-15 DIAGNOSIS — Z1231 Encounter for screening mammogram for malignant neoplasm of breast: Secondary | ICD-10-CM | POA: Diagnosis not present

## 2022-09-15 DIAGNOSIS — M810 Age-related osteoporosis without current pathological fracture: Secondary | ICD-10-CM | POA: Diagnosis not present

## 2022-09-16 ENCOUNTER — Encounter: Payer: Self-pay | Admitting: Family Medicine

## 2022-09-17 DIAGNOSIS — L905 Scar conditions and fibrosis of skin: Secondary | ICD-10-CM | POA: Diagnosis not present

## 2022-09-17 DIAGNOSIS — Z85828 Personal history of other malignant neoplasm of skin: Secondary | ICD-10-CM | POA: Diagnosis not present

## 2022-09-17 DIAGNOSIS — C44319 Basal cell carcinoma of skin of other parts of face: Secondary | ICD-10-CM | POA: Diagnosis not present

## 2022-09-17 DIAGNOSIS — L570 Actinic keratosis: Secondary | ICD-10-CM | POA: Diagnosis not present

## 2022-11-03 ENCOUNTER — Telehealth: Payer: Self-pay

## 2022-11-03 DIAGNOSIS — E039 Hypothyroidism, unspecified: Secondary | ICD-10-CM

## 2022-11-03 MED ORDER — LEVOTHYROXINE SODIUM 75 MCG PO TABS: ORAL_TABLET | ORAL | 3 refills | Status: DC

## 2022-11-03 NOTE — Telephone Encounter (Signed)
Spoke with pt and advised that rx has been sent into mail order pharmacy [facility].

## 2022-11-03 NOTE — Telephone Encounter (Signed)
Caller Name [name] Caller Phone Number [phone] Patient Name [name] Patient DOB 09/29/1931 Call Type Message Only Information Provided Reason for Call Medication Question / Request Initial Comment Caller states she needs a new Rx for her thyroid medication. [facility] with [facility]. Additional Comment Office hour provided. Triage declined. This is a 90 day supply with 3 refills. Disp. Time Disposition Final User 11/03/2022 7:56:01 AM General Information Provided Yes Izetta Dakin Call Closed By: Izetta Dakin Transaction Date/Time: 11/03/2022 7:53:05 AM (ET)

## 2022-11-06 DIAGNOSIS — K080 Exfoliation of teeth due to systemic causes: Secondary | ICD-10-CM | POA: Diagnosis not present

## 2022-11-13 DIAGNOSIS — D23121 Other benign neoplasm of skin of left upper eyelid, including canthus: Secondary | ICD-10-CM | POA: Diagnosis not present

## 2022-11-13 DIAGNOSIS — H1131 Conjunctival hemorrhage, right eye: Secondary | ICD-10-CM | POA: Diagnosis not present

## 2022-11-13 DIAGNOSIS — H401132 Primary open-angle glaucoma, bilateral, moderate stage: Secondary | ICD-10-CM | POA: Diagnosis not present

## 2022-11-13 DIAGNOSIS — D23122 Other benign neoplasm of skin of left lower eyelid, including canthus: Secondary | ICD-10-CM | POA: Diagnosis not present

## 2022-11-21 ENCOUNTER — Emergency Department (HOSPITAL_BASED_OUTPATIENT_CLINIC_OR_DEPARTMENT_OTHER): Payer: Medicare Other

## 2022-11-21 ENCOUNTER — Other Ambulatory Visit: Payer: Self-pay

## 2022-11-21 ENCOUNTER — Observation Stay (HOSPITAL_BASED_OUTPATIENT_CLINIC_OR_DEPARTMENT_OTHER)
Admission: EM | Admit: 2022-11-21 | Discharge: 2022-11-24 | Disposition: A | Discharge status: Home / Self Care | Payer: Medicare Other | Attending: Internal Medicine | Admitting: Internal Medicine

## 2022-11-21 ENCOUNTER — Encounter (HOSPITAL_BASED_OUTPATIENT_CLINIC_OR_DEPARTMENT_OTHER): Payer: Self-pay | Admitting: Emergency Medicine

## 2022-11-21 DIAGNOSIS — R262 Difficulty in walking, not elsewhere classified: Secondary | ICD-10-CM | POA: Insufficient documentation

## 2022-11-21 DIAGNOSIS — I129 Hypertensive chronic kidney disease with stage 1 through stage 4 chronic kidney disease, or unspecified chronic kidney disease: Secondary | ICD-10-CM | POA: Diagnosis not present

## 2022-11-21 DIAGNOSIS — Z79899 Other long term (current) drug therapy: Secondary | ICD-10-CM | POA: Diagnosis not present

## 2022-11-21 DIAGNOSIS — I10 Essential (primary) hypertension: Secondary | ICD-10-CM | POA: Diagnosis not present

## 2022-11-21 DIAGNOSIS — Z8541 Personal history of malignant neoplasm of cervix uteri: Secondary | ICD-10-CM | POA: Insufficient documentation

## 2022-11-21 DIAGNOSIS — C50919 Malignant neoplasm of unspecified site of unspecified female breast: Secondary | ICD-10-CM | POA: Diagnosis present

## 2022-11-21 DIAGNOSIS — W010XXA Fall on same level from slipping, tripping and stumbling without subsequent striking against object, initial encounter: Secondary | ICD-10-CM | POA: Insufficient documentation

## 2022-11-21 DIAGNOSIS — K449 Diaphragmatic hernia without obstruction or gangrene: Secondary | ICD-10-CM | POA: Diagnosis not present

## 2022-11-21 DIAGNOSIS — Y92002 Bathroom of unspecified non-institutional (private) residence single-family (private) house as the place of occurrence of the external cause: Secondary | ICD-10-CM | POA: Insufficient documentation

## 2022-11-21 DIAGNOSIS — Z1152 Encounter for screening for COVID-19: Secondary | ICD-10-CM | POA: Insufficient documentation

## 2022-11-21 DIAGNOSIS — Z853 Personal history of malignant neoplasm of breast: Secondary | ICD-10-CM | POA: Diagnosis not present

## 2022-11-21 DIAGNOSIS — N1831 Chronic kidney disease, stage 3a: Secondary | ICD-10-CM | POA: Diagnosis not present

## 2022-11-21 DIAGNOSIS — Z85828 Personal history of other malignant neoplasm of skin: Secondary | ICD-10-CM | POA: Insufficient documentation

## 2022-11-21 DIAGNOSIS — R0789 Other chest pain: Secondary | ICD-10-CM | POA: Diagnosis not present

## 2022-11-21 DIAGNOSIS — Z87891 Personal history of nicotine dependence: Secondary | ICD-10-CM | POA: Diagnosis not present

## 2022-11-21 DIAGNOSIS — Z681 Body mass index (BMI) 19 or less, adult: Secondary | ICD-10-CM | POA: Diagnosis not present

## 2022-11-21 DIAGNOSIS — E039 Hypothyroidism, unspecified: Secondary | ICD-10-CM | POA: Insufficient documentation

## 2022-11-21 DIAGNOSIS — R2681 Unsteadiness on feet: Secondary | ICD-10-CM | POA: Diagnosis not present

## 2022-11-21 DIAGNOSIS — J90 Pleural effusion, not elsewhere classified: Secondary | ICD-10-CM | POA: Diagnosis not present

## 2022-11-21 DIAGNOSIS — N289 Disorder of kidney and ureter, unspecified: Secondary | ICD-10-CM | POA: Diagnosis not present

## 2022-11-21 DIAGNOSIS — S2242XA Multiple fractures of ribs, left side, initial encounter for closed fracture: Principal | ICD-10-CM | POA: Insufficient documentation

## 2022-11-21 DIAGNOSIS — I517 Cardiomegaly: Secondary | ICD-10-CM | POA: Diagnosis not present

## 2022-11-21 DIAGNOSIS — S2249XA Multiple fractures of ribs, unspecified side, initial encounter for closed fracture: Secondary | ICD-10-CM

## 2022-11-21 DIAGNOSIS — R079 Chest pain, unspecified: Secondary | ICD-10-CM | POA: Diagnosis not present

## 2022-11-21 DIAGNOSIS — J9811 Atelectasis: Secondary | ICD-10-CM | POA: Diagnosis not present

## 2022-11-21 DIAGNOSIS — J479 Bronchiectasis, uncomplicated: Secondary | ICD-10-CM | POA: Diagnosis not present

## 2022-11-21 DIAGNOSIS — S3991XA Unspecified injury of abdomen, initial encounter: Secondary | ICD-10-CM | POA: Diagnosis not present

## 2022-11-21 DIAGNOSIS — R918 Other nonspecific abnormal finding of lung field: Secondary | ICD-10-CM | POA: Diagnosis present

## 2022-11-21 DIAGNOSIS — J849 Interstitial pulmonary disease, unspecified: Secondary | ICD-10-CM | POA: Diagnosis not present

## 2022-11-21 DIAGNOSIS — W19XXXA Unspecified fall, initial encounter: Secondary | ICD-10-CM

## 2022-11-21 DIAGNOSIS — K5790 Diverticulosis of intestine, part unspecified, without perforation or abscess without bleeding: Secondary | ICD-10-CM | POA: Diagnosis not present

## 2022-11-21 DIAGNOSIS — R911 Solitary pulmonary nodule: Secondary | ICD-10-CM | POA: Insufficient documentation

## 2022-11-21 DIAGNOSIS — E785 Hyperlipidemia, unspecified: Secondary | ICD-10-CM | POA: Diagnosis present

## 2022-11-21 DIAGNOSIS — S299XXA Unspecified injury of thorax, initial encounter: Secondary | ICD-10-CM | POA: Diagnosis not present

## 2022-11-21 HISTORY — DX: Multiple fractures of ribs, unspecified side, initial encounter for closed fracture: S22.49XA

## 2022-11-21 HISTORY — DX: Other nonspecific abnormal finding of lung field: R91.8

## 2022-11-21 LAB — CBC
HCT: 40.5 % (ref 36.0–46.0)
Hemoglobin: 13.2 g/dL (ref 12.0–15.0)
MCH: 30.5 pg (ref 26.0–34.0)
MCHC: 32.6 g/dL (ref 30.0–36.0)
MCV: 93.5 fL (ref 80.0–100.0)
Platelets: 246 10*3/uL (ref 150–400)
RBC: 4.33 MIL/uL (ref 3.87–5.11)
RDW: 15.3 % (ref 11.5–15.5)
WBC: 9.1 10*3/uL (ref 4.0–10.5)
nRBC: 0 % (ref 0.0–0.2)

## 2022-11-21 LAB — COMPREHENSIVE METABOLIC PANEL
ALT: 24 U/L (ref 0–44)
AST: 35 U/L (ref 15–41)
Albumin: 3.7 g/dL (ref 3.5–5.0)
Alkaline Phosphatase: 107 U/L (ref 38–126)
Anion gap: 7 (ref 5–15)
BUN: 16 mg/dL (ref 8–23)
CO2: 25 mmol/L (ref 22–32)
Calcium: 9.6 mg/dL (ref 8.9–10.3)
Chloride: 105 mmol/L (ref 98–111)
Creatinine, Ser: 0.94 mg/dL (ref 0.44–1.00)
GFR, Estimated: 58 mL/min — ABNORMAL LOW (ref >60)
Glucose, Bld: 83 mg/dL (ref 70–99)
Potassium: 3.9 mmol/L (ref 3.5–5.1)
Sodium: 137 mmol/L (ref 135–145)
Total Bilirubin: 0.7 mg/dL (ref 0.3–1.2)
Total Protein: 7.2 g/dL (ref 6.5–8.1)

## 2022-11-21 MED ORDER — SODIUM CHLORIDE 0.9% FLUSH: 3.0000 mL | INTRAVENOUS | Status: DC | PRN

## 2022-11-21 MED ORDER — HYDROCODONE-ACETAMINOPHEN 5-325 MG PO TABS
1.0000 | ORAL_TABLET | ORAL | Status: AC
  Administered 2022-11-21: 1 via ORAL
  Filled 2022-11-21: qty 1

## 2022-11-21 MED ORDER — SODIUM CHLORIDE 0.9 % IV SOLN: 250.0000 mL | INTRAVENOUS | Status: DC | PRN

## 2022-11-21 MED ORDER — AMLODIPINE BESYLATE 5 MG PO TABS
2.5000 mg | ORAL_TABLET | Freq: Every day | ORAL | Status: DC
  Administered 2022-11-22 – 2022-11-23 (×2): 2.5 mg via ORAL
  Filled 2022-11-21 (×3): qty 1

## 2022-11-21 MED ORDER — SODIUM CHLORIDE 0.9% FLUSH
3.0000 mL | Freq: Two times a day (BID) | INTRAVENOUS | Status: DC
  Administered 2022-11-21 – 2022-11-23 (×5): 3 mL via INTRAVENOUS

## 2022-11-21 MED ORDER — LATANOPROST 0.005 % OP SOLN
1.0000 [drp] | Freq: Every day | OPHTHALMIC | Status: DC
  Administered 2022-11-21 – 2022-11-23 (×3): 1 [drp] via OPHTHALMIC
  Filled 2022-11-21: qty 2.5

## 2022-11-21 MED ORDER — MORPHINE SULFATE (PF) 2 MG/ML IV SOLN: 1.0000 mg | INTRAVENOUS | Status: DC | PRN

## 2022-11-21 MED ORDER — LIDOCAINE 5 % EX PTCH
2.0000 | MEDICATED_PATCH | CUTANEOUS | Status: DC
  Administered 2022-11-21 – 2022-11-23 (×3): 2 via TRANSDERMAL
  Filled 2022-11-21 (×4): qty 2

## 2022-11-21 MED ORDER — KETOROLAC TROMETHAMINE 15 MG/ML IJ SOLN
15.0000 mg | Freq: Once | INTRAMUSCULAR | Status: AC
  Administered 2022-11-21: 15 mg via INTRAVENOUS
  Filled 2022-11-21: qty 1

## 2022-11-21 MED ORDER — ENOXAPARIN SODIUM 40 MG/0.4ML IJ SOSY
40.0000 mg | PREFILLED_SYRINGE | Freq: Every day | INTRAMUSCULAR | Status: DC
  Administered 2022-11-21 – 2022-11-23 (×3): 40 mg via SUBCUTANEOUS
  Filled 2022-11-21 (×3): qty 0.4

## 2022-11-21 MED ORDER — LEVOTHYROXINE SODIUM 75 MCG PO TABS
75.0000 ug | ORAL_TABLET | Freq: Every day | ORAL | Status: DC
  Administered 2022-11-22 – 2022-11-24 (×3): 75 ug via ORAL
  Filled 2022-11-21 (×3): qty 1

## 2022-11-21 MED ORDER — HYDROCODONE-ACETAMINOPHEN 5-325 MG PO TABS
1.0000 | ORAL_TABLET | ORAL | Status: DC | PRN
  Administered 2022-11-21 – 2022-11-22 (×2): 2 via ORAL
  Filled 2022-11-21 (×2): qty 2

## 2022-11-21 MED ORDER — ROSUVASTATIN CALCIUM 10 MG PO TABS
10.0000 mg | ORAL_TABLET | Freq: Every day | ORAL | Status: DC
  Administered 2022-11-22 – 2022-11-23 (×2): 10 mg via ORAL
  Filled 2022-11-21 (×3): qty 1

## 2022-11-21 MED ORDER — IOHEXOL 300 MG/ML  SOLN
100.0000 mL | Freq: Once | INTRAMUSCULAR | Status: AC | PRN
  Administered 2022-11-21: 100 mL via INTRAVENOUS

## 2022-11-21 MED ORDER — METHOCARBAMOL 1000 MG/10ML IJ SOLN
500.0000 mg | Freq: Four times a day (QID) | INTRAVENOUS | Status: DC | PRN
  Filled 2022-11-21: qty 5

## 2022-11-21 NOTE — Assessment & Plan Note (Addendum)
S/p lumpectomy in 2010 with radiation anastrozole x 5 years In remission

## 2022-11-21 NOTE — Assessment & Plan Note (Signed)
Stable, continue to monitor  ?

## 2022-11-21 NOTE — ED Provider Notes (Signed)
[facility] Provider Note   CSN: [id number] Arrival date & time: 11/21/22  1152     History  Chief Complaint  Patient presents with   [name] is a 89 y.o. female.   Fall  Patient presents after fall.  Yesterday afternoon slipped on a rug in the bathroom and landed on her left chest onto the bathtub.  Left lower chest pain since.  Pain with breathing.  No abdominal pain.  Did not hit head.    Past Medical History:  Diagnosis Date   Acute blood loss anemia    AKI (acute kidney injury) ([place])    Autoimmune disease ([place])    Back pain 05/04/2020   Barrett's esophagus    Basal cell carcinoma (BCC) in situ of skin 04/04/2020   Path 03/2020, [facility]    Bone lesion    sacrum   Breast cancer ([place]) 2010   cT1 N0 M0; ER+/ PR+/ HER2 neg; s/p lumpectomy 07/2010; started adjuvant hormonal therapy 10/2009   Cellulitis of skin 12/31/2020   Cervical cancer ([place])    Chronic midline thoracic back pain 12/31/2020   Diarrhea 09/08/2017   Dizziness 02/22/2019   Dysphagia    Elevated serum creatinine 09/08/2017   Esophageal spasm 09/20/2013   Essential hypertension 03/21/2019   Fall at home, initial encounter 04/29/2020   Fracture, shoulder 03/06/2012   Pt seen at [facility] by Dr. Iran Planas on 03/09/2012 and 03/11/2012.   X-rays taken of left wrist, left elbow, and left shoulder.   Shoulder Fx: nondisplaced proximal humerus fracture involving the greater tuberosity. Wrist: consistent with an old distal radius fracture with shortening present.       Glaucoma    Hoarseness of voice 02/28/2016   Humerus fracture 03/2012   impacted left humueral neck fracture, treated by Dr. Amedeo Plenty   Hx of meningioma of the brain 08/28/2017   Stable since 2010- 2018 on MRI   Hypercalcemia    Hyperlipidemia    Hypertension    Hypothyroidism    Intracranial hemorrhage (HCC)    Nausea vomiting and diarrhea 09/08/2017   Orbital  fracture, closed, initial encounter ([place]) 04/29/2020   Osteoporosis 03/18/2012   Paroxysmal SVT (supraventricular tachycardia) 12/23/2020   Pneumonia    PVC (premature ventricular contraction) 09/08/2017   Raynaud's phenomenon 02/28/2016   SAH (subarachnoid hemorrhage) ([place]) 04/29/2020   Squamous cell carcinoma in situ of skin 11/30/2018   Path 11/1008, Dermatology managing    Stage 3a chronic kidney disease    Systolic murmur 7/32/2025   TBI (traumatic brain injury) ([place]) 05/04/2020   Past Surgical History:  Procedure Laterality Date   ABDOMINAL HYSTERECTOMY  1972   cancer          1 tube 1 ovary   APPENDECTOMY     BACK SURGERY  11/28/2020   BREAST LUMPECTOMY Left 2010   colon polyp removal     EYE SURGERY     HAND SURGERY     KYPHOPLASTY N/A 11/28/2020   Procedure: KYPHOPLASTY THORACIC TWELVE AND LUMBAR ONE;  Surgeon: Melina Schools, MD;  Location: [facility];  Service: Orthopedics;  Laterality: N/A;  90 mins   ortho procedures     multiple   OVARIAN CYST REMOVAL  1978   SHOULDER SURGERY  1954   left    Home Medications Prior to Admission medications   Medication Sig Start Date End Date Taking? Authorizing Provider  amLODipine (NORVASC) 2.5 MG  tablet Take 1 tablet (2.5 mg total) by mouth daily. 01/29/22   Copland, Gay Filler, MD  Calcium Carbonate-Vitamin D (CALCIUM + D PO) Take 1 capsule by mouth daily.     [provider]  cephALEXin (KEFLEX) 500 MG capsule Take 1 capsule (500 mg total) by mouth 2 (two) times daily. 07/31/22   Copland, Gay Filler, MD  COVID-19 mRNA vaccine [phone] (COMIRNATY) syringe Inject into the muscle. 07/31/22   Carlyle Basques, MD  latanoprost (XALATAN) 0.005 % ophthalmic solution Place 1 drop into both eyes at bedtime.     [provider]  levothyroxine (SYNTHROID) 75 MCG tablet TAKE 1 TABLET BY MOUTH DAILY BEFORE BREAKFAST 11/03/22   Copland, Gay Filler, MD  Multiple Vitamin (MULTIVITAMIN WITH MINERALS) TABS tablet Take 1 tablet by mouth daily.  One a day    [provider]  rosuvastatin (CRESTOR) 10 MG tablet Take 1 tablet (10 mg total) by mouth daily. TAKE 1 BY MOUTH DAILY 01/29/22   Copland, Gay Filler, MD      Allergies    Daypro [oxaprozin] and Prolia [denosumab]    Review of Systems   Review of Systems  Physical Exam Updated Vital Signs BP (!) 163/107   Pulse 63   Temp 97.9 F (36.6 C) (Oral)   Resp 16   Ht 5\' 4"  (1.626 m)   Wt 49 kg   SpO2 97%   BMI 18.54 kg/m  Physical Exam Vitals and nursing note reviewed.  HENT:     Head: Atraumatic.  Cardiovascular:     Rate and Rhythm: Regular rhythm.  Pulmonary:     Breath sounds: No wheezing or rhonchi.     Comments: Tenderness to left lateral lower chest wall.  No crepitance.  No deformity.  No subcu eczema. Chest:     Chest wall: Tenderness present.  Abdominal:     Tenderness: There is abdominal tenderness.     Comments: No left upper quadrant tenderness.  Musculoskeletal:        General: No tenderness.     Cervical back: Neck supple.  Skin:    General: Skin is warm.  Neurological:     Mental Status: She is alert and oriented to person, place, and time.     ED Results / Procedures / Treatments   Labs (all labs ordered are listed, but only abnormal results are displayed) Labs Reviewed  COMPREHENSIVE METABOLIC PANEL - Abnormal; Notable for the following components:      Result Value   GFR, Estimated 58 (*)    All other components within normal limits  CBC    EKG None  Radiology DG Ribs Unilateral W/Chest Left  Result Date: 11/21/2022 CLINICAL DATA:  Mechanical fall yesterday afternoon, slipped on rug, struck LEFT ribs, woke up today with increased pain with movement and deep breathing EXAM: LEFT RIBS AND CHEST - 3+ VIEW COMPARISON:  09/20/2021 FINDINGS: Enlargement of cardiac silhouette. Mediastinal contours and pulmonary vascularity normal. Minimal persistent LEFT basilar atelectasis. Increased opacity at lateral RIGHT lung base since 2022,  concerning for mass. Remaining lungs clear. No pleural effusion or pneumothorax. Bones demineralized. BB placed at site of symptoms lower LEFT ribs. Minimally displaced fracture of the anterolateral LEFT ninth rib. Questionable nondisplaced fracture LEFT seventh rib. IMPRESSION: Fractures of LEFT ninth and questionably LEFT seventh ribs. Increased opacity at lateral RIGHT lung base since 2022, concerning for tumor; CT chest recommended for further evaluation, with contrast if patient's renal function permits. Findings called to Dr.  Alvino Chapel on  11/21/2022 at 1246 hours. Electronically Signed   By: Lavonia Dana M.D.   On: 11/21/2022 12:47    Procedures Procedures    Medications Ordered in ED Medications  ketorolac (TORADOL) 15 MG/ML injection 15 mg (15 mg Intravenous Given 11/21/22 1324)  iohexol (OMNIPAQUE) 300 MG/ML solution 100 mL (100 mLs Intravenous Contrast Given 11/21/22 1504)    ED Course/ Medical Decision Making/ A&P                             Medical Decision Making Amount and/or Complexity of Data Reviewed Labs: ordered. Radiology: ordered.  Risk Prescription drug management.   Patient with fall.  Landed on left chest.  Felt crunching that she thinks could have been the ribs.  Still feels some crunching in the chest.  No subcu emphysema.  Rib fractures considered.  Will check for other complications such as pneumothorax and hemothorax.  Doubt intra-abdominal injury.  Does have density on right lower lung field on my independent interpretation of the x-ray.  Increased from prior.  However that was increased from the prior before.  Not having respiratory symptoms.  Blood work reassuring.  CT scan done and does show density on the right lower lung.  Still waiting on read.  So will evaluate for fractures on the left.   Care turned over to Dr. Sharlett Iles.        Final Clinical Impression(s) / ED Diagnoses Final diagnoses:  Fall, initial encounter  Closed fracture of multiple  ribs of left side, initial encounter    Rx / DC Orders ED Discharge Orders     None         Davonna Belling, MD 11/21/22 1534

## 2022-11-21 NOTE — Progress Notes (Signed)
Received call from admitting with [facility] stating that they are changing attending doctor to Dr. Rogers Blocker.

## 2022-11-21 NOTE — ED Triage Notes (Addendum)
Pt had mechanical fall yesterday afternoon.  Pt slipped on a rug and hit her left ribs.  Pt woke up today with increased pain with movement and deep breath.  No obvious deformities noted.  VSS.  No head injury, no LOC,  no blood thinners.

## 2022-11-21 NOTE — Progress Notes (Signed)
Plan of Care Note for accepted transfer   Patient: SHIVALI QUACKENBUSH MRN: [id number]   DOA: 11/21/2022  Facility requesting transfer: [facility] Requesting Provider: Dr. Philip Aspen Reason for transfer: fall and rib fractures Facility course: 89 year old with history of breast cancer, CKD stage 3a, HLD, HTN, hypothyroidism who fell at home and hit her bathtub and suffered rib fractures left 7-9th. No pneumothorax.  Also incidental finding on CT of large, irregular mass of the RLL increased in size when compared with prior CT and concerning for primary lung malignancy. Also small solid pulmonary nodules seen in chest. Subpleural reticular opacities and traction bronchiectasis, findings can be seen in setting of interstitial lung disease.  Vitals stable, on RA Labs: stable  In ED: given toradol, norco and lidocaine patch. IS q hour. Pain continues to be uncontrolled and she needs inpatient pain control. [facility] asked to admit.   Plan of care: The patient is accepted for admission to [facility]  unit, at [facility]..  Pain control, ? Home vs. Rehab and pulm/onc consult for large lung mass    Author: Orma Flaming, MD 11/21/2022  Check [email or web address] for on-call coverage.  Nursing staff, Please call [place] number on Amion as soon as patient's arrival, so appropriate admitting provider can evaluate the pt.

## 2022-11-21 NOTE — Assessment & Plan Note (Signed)
Continue crestor daily

## 2022-11-21 NOTE — Assessment & Plan Note (Addendum)
Incidental finding off chest CT today, but thought most consistent with scarring vs. atelectasis on CT from 2022 Large, irregular mass of the RLL with pleural thickening measuring 4.9 x 4.1cm increased in size when compared with prior CT and concerning for primary lung malignancy.  Has part solid and small solid pulmonary nodules in chest  Remote smoking history in her 30's, no environmental exposure. No weight loss, still walking 3-5 miles a day.  Recommend additional eval imaging and pulm consult per day team

## 2022-11-21 NOTE — Progress Notes (Signed)
Paged admitting with [facility] at [phone], in regards to the pt being here in room 1309. Patty, returned call and stated that she will be notifying Dr. Nevada Crane of the pt's arrival to the unit.

## 2022-11-21 NOTE — ED Notes (Signed)
Report given to [facility] at [facility].

## 2022-11-21 NOTE — ED Notes (Signed)
Report given to [employer], EMTP.

## 2022-11-21 NOTE — ED Provider Notes (Signed)
  Physical Exam  BP 116/65 (BP Location: Right Arm)   Pulse 94   Temp (!) 97.2 F (36.2 C) (Oral)   Resp 14   Ht 5\' 4"  (1.626 m)   Wt 49 kg   SpO2 96%   BMI 18.54 kg/m   Physical Exam  Procedures  Procedures  ED Course / MDM   Clinical Course as of 11/22/22 1226  Fri Nov 21, 2022  1548 Assumed care from Dr Alvino Chapel. 89 yo F with hx of breast cancer in remission who had mechanical fall and hit bathtub. CXR with L 7-9th rib fracture that showed lung mass. CT today shows RLL mass concerning for cancer. Was seen on prior imaging but has not had dedicated chest CT. Daughter would like to be informed of findings first. Will require treatment for rib fractures.  [RP]  9528 Updated the patient's daughter regarding the lung mass.  Patient's daughter will disclose to the patient.  Patient with persistent pain and splinting despite her pain medication.  Daughter is concerned about the patient going home since she has very little help there and her daughter lives over 45 minutes away.  Feel that is reasonable to admit the patient for pain control given her advanced age and multiple rib fractures.  Dr Rogers Blocker from hospitalist has accepted the patient. [RP]    Clinical Course User Index [RP] Fransico Meadow, MD   Medical Decision Making Amount and/or Complexity of Data Reviewed Labs: ordered. Radiology: ordered.  Risk Prescription drug management. Decision regarding hospitalization.     Fransico Meadow, MD 11/22/22 1226

## 2022-11-21 NOTE — H&P (Signed)
History and Physical    Patient: [name] JKD:[id number] DOB: [birth year] DOA: 11/21/2022 DOS: the patient was seen and examined on 11/21/2022 PCP: Copland, Gay Filler, MD  Patient coming from:  [facility]  - lives with her husband    Chief Complaint: fall   HPI: [name] is a 89 y.o. female with medical history significant of  breast cancer, CKD stage 3a, HLD, HTN, hypothyroidism who fell at home and hit her bathtub. She fell yesterday afternoon around 3pm.  She opened the door to the bathroom and isn't sure if her rug got caught and she tripped or what but it threw her off balance and she went down and her left side hit the side of her tub.  She did not hit her head.  She is not sure how she got up on her own and then her husband got home.  She knew her ribs were hurt. Sh was fine and able to cook dinner. It started to hurt her last night and was still hurting her this morning so they called her daughter who recommended they call 911.  History of fall with orbital fracture, SAH and thoracic compression fracture in August of 2021. Underwent kyphoplasy in February of 2022.    She has been feeling good. Denies any fever/chills, vision changes/headaches, chest pain or palpitations, shortness of breath or cough, abdominal pain, N/V/D, dysuria or leg swelling. She denies any weight loss. Remote history of smoking.  She smoked for about 10 years in her early thirties and smoked various amounts. Denies any environmental exposure   She does not smoke or drink alcohol.   ER Course:  vitals: afebrile, bp: 166/86, HR: 68, RR: 18, oxygen: 100%RA Pertinent labs: none Rib xray: Fractures of LEFT ninth and questionably LEFT seventh ribs.  Increased opacity at lateral RIGHT lung base since 2022, concerning for tumor; CT chest recommended for further evaluation, with contrast if patient's renal function permits. CT chest/abdomen/pelvis: . Nondisplaced fractures of the anterolateral left 7th-9th ribs.  No evidence of pneumothorax. No acute traumatic findings in the abdomen or pelvis. Trace free fluid is seen in the right upper quadrant of the abdomen which is nonspecific given no evidence of solid organ injury. Large irregular mass of the right lower lobe, increased in size when compared with prior CT and concerning for primary lung malignancy. Additional imaging evaluation or consultation with Pulmonology or Thoracic Surgery recommended. Additional part solid and small solid pulmonary nodules are seen in the chest. Recommend attention on follow-up. Small pericardial effusion and trace bilateral pleural effusions. 6. Subpleural reticular opacities and traction bronchiectasis, findings can be seen in setting of interstitial lung disease. In ED: given toradol, norco and lidocaine patch. IS q hour. Pain continues to be uncontrolled and she needs inpatient pain control. TRH asked to admit.     Review of Systems: As mentioned in the history of present illness. All other systems reviewed and are negative. Past Medical History:  Diagnosis Date   Acute blood loss anemia    AKI (acute kidney injury) ([place])    Autoimmune disease ([place])    Back pain 05/04/2020   Barrett's esophagus    Basal cell carcinoma (BCC) in situ of skin 04/04/2020   Path 03/2020, [facility]    Bone lesion    sacrum   Breast cancer ([place]) 2010   cT1 N0 M0; ER+/ PR+/ HER2 neg; s/p lumpectomy 07/2010; started adjuvant hormonal therapy 10/2009   Cellulitis of skin 12/31/2020   Cervical  cancer [facility])    Chronic midline thoracic back pain 12/31/2020   Diarrhea 09/08/2017   Dizziness 02/22/2019   Dysphagia    Elevated serum creatinine 09/08/2017   Esophageal spasm 09/20/2013   Essential hypertension 03/21/2019   Fall at home, initial encounter 04/29/2020   Fracture, shoulder 03/06/2012   Pt seen at [facility] by Dr. Iran Planas on 03/09/2012 and 03/11/2012.   X-rays taken of left wrist, left elbow, and left shoulder.    Shoulder Fx: nondisplaced proximal humerus fracture involving the greater tuberosity. Wrist: consistent with an old distal radius fracture with shortening present.       Glaucoma    Hoarseness of voice 02/28/2016   Humerus fracture 03/2012   impacted left humueral neck fracture, treated by Dr. Amedeo Plenty   Hx of meningioma of the brain 08/28/2017   Stable since 2010- 2018 on MRI   Hypercalcemia    Hyperlipidemia    Hypertension    Hypothyroidism    Intracranial hemorrhage (HCC)    Nausea vomiting and diarrhea 09/08/2017   Orbital fracture, closed, initial encounter ([place]) 04/29/2020   Osteoporosis 03/18/2012   Paroxysmal SVT (supraventricular tachycardia) 12/23/2020   Pneumonia    PVC (premature ventricular contraction) 09/08/2017   Raynaud's phenomenon 02/28/2016   SAH (subarachnoid hemorrhage) ([place]) 04/29/2020   Squamous cell carcinoma in situ of skin 11/30/2018   Path 11/1008, Dermatology managing    Stage 3a chronic kidney disease    Systolic murmur 3/53/2992   TBI (traumatic brain injury) ([place]) 05/04/2020   Past Surgical History:  Procedure Laterality Date   ABDOMINAL HYSTERECTOMY  1972   cancer          1 tube 1 ovary   APPENDECTOMY     BACK SURGERY  11/28/2020   BREAST LUMPECTOMY Left 2010   colon polyp removal     EYE SURGERY     HAND SURGERY     KYPHOPLASTY N/A 11/28/2020   Procedure: KYPHOPLASTY THORACIC TWELVE AND LUMBAR ONE;  Surgeon: Melina Schools, MD;  Location: [place];  Service: Orthopedics;  Laterality: N/A;  90 mins   ortho procedures     multiple   OVARIAN CYST REMOVAL  1978   SHOULDER SURGERY  1954   left   Social History:  reports that she quit smoking about 35 years ago. Her smoking use included cigarettes. She has never used smokeless tobacco. She reports current alcohol use. She reports that she does not use drugs.  Allergies  Allergen Reactions   Daypro [Oxaprozin] Swelling    Face and tongue   Prolia [Denosumab]     unknown    Family History  Problem  Relation Age of Onset   Heart disease Father    Cancer Sister        lung, kidney   Cancer Brother        brain   Cancer Paternal Aunt        breast   Breast cancer Paternal Aunt    Cancer Brother        liver, lung   Osteoporosis Neg Hx     Prior to Admission medications   Medication Sig Start Date End Date Taking? Authorizing Provider  amLODipine (NORVASC) 2.5 MG tablet Take 1 tablet (2.5 mg total) by mouth daily. 01/29/22   Copland, Gay Filler, MD  Calcium Carbonate-Vitamin D (CALCIUM + D PO) Take 1 capsule by mouth daily.     [provider]  cephALEXin (KEFLEX) 500 MG capsule Take 1 capsule (500  mg total) by mouth 2 (two) times daily. 07/31/22   Copland, Gay Filler, MD  COVID-19 mRNA vaccine [phone] (COMIRNATY) syringe Inject into the muscle. 07/31/22   Carlyle Basques, MD  latanoprost (XALATAN) 0.005 % ophthalmic solution Place 1 drop into both eyes at bedtime.     [provider]  levothyroxine (SYNTHROID) 75 MCG tablet TAKE 1 TABLET BY MOUTH DAILY BEFORE BREAKFAST 11/03/22   Copland, Gay Filler, MD  Multiple Vitamin (MULTIVITAMIN WITH MINERALS) TABS tablet Take 1 tablet by mouth daily. One a day    [provider]  rosuvastatin (CRESTOR) 10 MG tablet Take 1 tablet (10 mg total) by mouth daily. TAKE 1 BY MOUTH DAILY 01/29/22   Copland, Gay Filler, MD    Physical Exam: Vitals:   11/21/22 1215 11/21/22 1515 11/21/22 1615 11/21/22 2019  BP:  (!) 163/107 (!) 149/91 (!) 147/81  Pulse: 68 63 70 70  Resp:  16 16 16   Temp:   97.9 F (36.6 C) 98 F (36.7 C)  TempSrc:    Oral  SpO2: 99% 97% 98% 98%  Weight:      Height:       General:  Appears calm and comfortable and is in NAD Eyes:  PERRL, EOMI, normal lids, iris ENT:  HOH, lips & tongue, mmm; appropriate dentition Neck:  no LAD, masses or thyromegaly; no carotid bruits Cardiovascular:  RRR, no m/r/g. No LE edema.  Respiratory:   CTA bilaterally with no wheezes/rales/rhonchi.  Normal respiratory  effort, but shallow due to pain  Abdomen:  soft, NT, ND, NABS Back:   normal alignment, no CVAT Skin:  no rash or induration seen on limited exam Musculoskeletal:  grossly normal tone BUE/BLE, good ROM TTP over left lateral thoracic ribs T6-9 area, especially under left breast Lower extremity:  No LE edema.  Limited foot exam with no ulcerations.  2+ distal pulses. Psychiatric:  grossly normal mood and affect, speech fluent and appropriate, AOx3 Neurologic:  CN 2-12 grossly intact, moves all extremities in coordinated fashion, sensation intact   Radiological Exams on Admission: Independently reviewed - see discussion in A/P where applicable  CT CHEST ABDOMEN PELVIS W CONTRAST  Result Date: 11/21/2022 CLINICAL DATA:  Fall yesterday afternoon; * Tracking Code: BO * EXAM: CT CHEST, ABDOMEN, AND PELVIS WITH CONTRAST TECHNIQUE: Multidetector CT imaging of the chest, abdomen and pelvis was performed following the standard protocol during bolus administration of intravenous contrast. RADIATION DOSE REDUCTION: This exam was performed according to the departmental dose-optimization program which includes automated exposure control, adjustment of the mA and/or kV according to patient size and/or use of iterative reconstruction technique. CONTRAST:  128mL OMNIPAQUE IOHEXOL 300 MG/ML  SOLN COMPARISON:  CT abdomen and pelvis dated February 07, 2021 FINDINGS: CT CHEST FINDINGS Cardiovascular: Normal heart size. Small pericardial effusion. Normal caliber thoracic aorta with moderate atherosclerotic disease. Mediastinum/Nodes: Small hiatal hernia and patulous esophagus. Thyroid is unremarkable. No pathologically enlarged lymph nodes seen in the chest. Lungs/Pleura: Central airways are patent. Mild subpleural reticular opacities with associated traction bronchiolectasis. Large irregular mass of the right lower lobe with air bronchograms and adjacent pleural thickening measuring 4.9 x 4.1 cm on series 4 image 95, finding  was partially visualized on prior CT and is increased in size, visualized portion measured 3.9 x 3.4 cm. Small subpleural solid nodular opacity of the anterior right upper lobe measuring 1.2 x 0.7 cm on series 4, image 49. Small solid nodule of the lingula measuring 5 mm on series 4, image  71. Part solid nodule of the left lung apex measuring 2.1 x 1.6 cm in overall diameter with approximate 1.1 x 0.9 cm solid component. Trace bilateral pleural effusions. No evidence of pneumothorax. Musculoskeletal: Nondisplaced fractures of the anterolateral left 7th-9th ribs. Severe compression deformities of T11, T12 and L1 with vertebroplasty changes at T12 and L1, unchanged when compared with prior exam. CT ABDOMEN PELVIS FINDINGS Hepatobiliary: No hepatic injury or perihepatic hematoma. Unchanged low-attenuation lesions of the right lobe of the liver which are likely simple cysts. Gallbladder is unremarkable. Pancreas: Unremarkable. No pancreatic ductal dilatation or surrounding inflammatory changes. Spleen: Normal in size without focal abnormality. Adrenals/Urinary Tract: Bilateral adrenal glands are unremarkable. No hydronephrosis or nephrolithiasis. Bilateral low-attenuation renal lesions which are too small to accurately characterize but likely simple cysts, no specific follow-up imaging is recommended. Bladder is unremarkable. Stomach/Bowel: Stomach is within normal limits. Severe diverticulosis. No evidence of bowel wall thickening, distention, or inflammatory changes. Vascular/Lymphatic: Aortic atherosclerosis. No enlarged abdominal or pelvic lymph nodes. Reproductive: Status post hysterectomy. No adnexal masses. Other: No abdominal wall hernia or abnormality. Trace free fluid seen in the right upper quadrant on series 2, image 65. Musculoskeletal: No acute or significant osseous findings. IMPRESSION: 1. Nondisplaced fractures of the anterolateral left 7th-9th ribs. No evidence of pneumothorax. 2. No acute traumatic  findings in the abdomen or pelvis. Trace free fluid is seen in the right upper quadrant of the abdomen which is nonspecific given no evidence of solid organ injury. 3. Large irregular mass of the right lower lobe, increased in size when compared with prior CT and concerning for primary lung malignancy. Additional imaging evaluation or consultation with Pulmonology or Thoracic Surgery recommended. 4. Additional part solid and small solid pulmonary nodules are seen in the chest. Recommend attention on follow-up. 5. Small pericardial effusion and trace bilateral pleural effusions. 6. Subpleural reticular opacities and traction bronchiectasis, findings can be seen in setting of interstitial lung disease. 7. Aortic Atherosclerosis (ICD10-I70.0). Electronically Signed   By: Yetta Glassman M.D.   On: 11/21/2022 15:47   DG Ribs Unilateral W/Chest Left  Result Date: 11/21/2022 CLINICAL DATA:  Mechanical fall yesterday afternoon, slipped on rug, struck LEFT ribs, woke up today with increased pain with movement and deep breathing EXAM: LEFT RIBS AND CHEST - 3+ VIEW COMPARISON:  09/20/2021 FINDINGS: Enlargement of cardiac silhouette. Mediastinal contours and pulmonary vascularity normal. Minimal persistent LEFT basilar atelectasis. Increased opacity at lateral RIGHT lung base since 2022, concerning for mass. Remaining lungs clear. No pleural effusion or pneumothorax. Bones demineralized. BB placed at site of symptoms lower LEFT ribs. Minimally displaced fracture of the anterolateral LEFT ninth rib. Questionable nondisplaced fracture LEFT seventh rib. IMPRESSION: Fractures of LEFT ninth and questionably LEFT seventh ribs. Increased opacity at lateral RIGHT lung base since 2022, concerning for tumor; CT chest recommended for further evaluation, with contrast if patient's renal function permits. Findings called to Dr.  Alvino Chapel on 11/21/2022 at 1246 hours. Electronically Signed   By: Lavonia Dana M.D.   On: 11/21/2022 12:47      Labs on Admission: I have personally reviewed the available labs and imaging studies at the time of the admission.  Pertinent labs:   none  Assessment and Plan: Principal Problem:   Rib fractures Active Problems:   Right lower lobe lung mass   Essential hypertension   Hypothyroidism   Hyperlipidemia   Stage 3a chronic kidney disease   Breast cancer (HCC)    Assessment and Plan: * Rib fractures 89  year old who presented with rib pain s/p mechanical fall found to have left nondisplaced thoracic rib fractures 7-9 with poor pain control -obs to med-surg -pain control with lidocaine patches, norco and morphine, but she thinks the pill will be fine -no pneumothorax  -IS to bedside -PT to eval  Right lower lobe lung mass Incidental finding off chest CT today, but thought most consistent with scarring vs. atelectasis on CT from 2022 Large, irregular mass of the RLL with pleural thickening measuring 4.9 x 4.1cm increased in size when compared with prior CT and concerning for primary lung malignancy.  Has part solid and small solid pulmonary nodules in chest  Remote smoking history in her 30's, no environmental exposure. No weight loss, still walking 3-5 miles a day.  Recommend additional eval imaging and pulm consult per day team   Essential hypertension Mildly elevated, but in range for near 89 year old with pain Pain control Continue home norvasc 2.5mg  daily   Hypothyroidism Tsh wnl in 07/2022 Continue synthroid 54mcg daily   Hyperlipidemia Continue crestor daily   Stage 3a chronic kidney disease Stable, continue to monitor   Breast cancer [facility]) S/p lumpectomy in 2010 with radiation anastrozole x 5 years In remission    Advance Care Planning:   Code Status: Full Code   Consults: PT  DVT Prophylaxis: lovenox   Family Communication: none   Severity of Illness: The appropriate patient status for this patient is OBSERVATION. Observation status is judged to be  reasonable and necessary in order to provide the required intensity of service to ensure the patient's safety. The patient's presenting symptoms, physical exam findings, and initial radiographic and laboratory data in the context of their medical condition is felt to place them at decreased risk for further clinical deterioration. Furthermore, it is anticipated that the patient will be medically stable for discharge from the hospital within 2 midnights of admission.   Author: Orma Flaming, MD 11/21/2022 9:31 PM  For on call review [email or web address].

## 2022-11-21 NOTE — Progress Notes (Signed)
Received report from [place]. RN at [employer] [facility].

## 2022-11-21 NOTE — Progress Notes (Signed)
Pt has arrived to room, via [facility].

## 2022-11-21 NOTE — Assessment & Plan Note (Signed)
Tsh wnl in 07/2022 Continue synthroid 7mcg daily

## 2022-11-21 NOTE — ED Notes (Signed)
Report called to Sandre Kitty, Ronny Bacon RN. Unable to answer due to "still in report, doesn't have a phone". Given number for callback.

## 2022-11-21 NOTE — Assessment & Plan Note (Addendum)
89 year old who presented with rib pain s/p mechanical fall found to have left nondisplaced thoracic rib fractures 7-9 with poor pain control -obs to med-surg -pain control with lidocaine patches, norco and morphine, but she thinks the pill will be fine -no pneumothorax  -IS to bedside -PT to eval

## 2022-11-21 NOTE — Assessment & Plan Note (Signed)
Mildly elevated, but in range for near 89 year old with pain Pain control Continue home norvasc 2.5mg  daily

## 2022-11-22 DIAGNOSIS — R918 Other nonspecific abnormal finding of lung field: Secondary | ICD-10-CM

## 2022-11-22 DIAGNOSIS — S2242XD Multiple fractures of ribs, left side, subsequent encounter for fracture with routine healing: Secondary | ICD-10-CM | POA: Diagnosis not present

## 2022-11-22 LAB — SARS CORONAVIRUS 2 BY RT PCR: SARS Coronavirus 2 by RT PCR: NEGATIVE

## 2022-11-22 MED ORDER — MORPHINE SULFATE (PF) 2 MG/ML IV SOLN: 1.0000 mg | INTRAVENOUS | Status: DC | PRN

## 2022-11-22 MED ORDER — TRAMADOL HCL 50 MG PO TABS
50.0000 mg | ORAL_TABLET | Freq: Four times a day (QID) | ORAL | Status: DC | PRN
  Administered 2022-11-23 – 2022-11-24 (×2): 50 mg via ORAL
  Filled 2022-11-22 (×2): qty 1

## 2022-11-22 MED ORDER — ACETAMINOPHEN 500 MG PO TABS
1000.0000 mg | ORAL_TABLET | Freq: Three times a day (TID) | ORAL | Status: DC
  Administered 2022-11-22 – 2022-11-23 (×6): 1000 mg via ORAL
  Filled 2022-11-22 (×7): qty 2

## 2022-11-22 MED ORDER — POLYETHYLENE GLYCOL 3350 17 G PO PACK
17.0000 g | PACK | Freq: Every day | ORAL | Status: DC
  Administered 2022-11-23: 17 g via ORAL
  Filled 2022-11-22 (×3): qty 1

## 2022-11-22 MED ORDER — SIMETHICONE 80 MG PO CHEW
80.0000 mg | CHEWABLE_TABLET | Freq: Four times a day (QID) | ORAL | Status: DC | PRN
  Administered 2022-11-22 – 2022-11-23 (×2): 80 mg via ORAL
  Filled 2022-11-22 (×2): qty 1

## 2022-11-22 MED ORDER — ONDANSETRON HCL 4 MG/2ML IJ SOLN
4.0000 mg | Freq: Four times a day (QID) | INTRAMUSCULAR | Status: DC | PRN
  Administered 2022-11-22: 4 mg via INTRAVENOUS
  Filled 2022-11-22: qty 2

## 2022-11-22 MED ORDER — METHOCARBAMOL 500 MG PO TABS
500.0000 mg | ORAL_TABLET | Freq: Three times a day (TID) | ORAL | Status: DC
  Administered 2022-11-22 – 2022-11-23 (×6): 500 mg via ORAL
  Filled 2022-11-22 (×7): qty 1

## 2022-11-22 NOTE — Consult Note (Signed)
NAME:  [name], MRN:  [id number], DOB:  25-Mar-1931, LOS: 0 ADMISSION DATE:  11/21/2022, CONSULTATION DATE:  11/22/22 REFERRING MD:  Vernell Leep, MD, CHIEF COMPLAINT:  Right lower lobe lung mass   History of Present Illness:  89 year old female former smoker admitted for mechanical fall with resultant fractures in left 7th thru 9th ribs.  In the ED imaging demonstrated incidental findings of right lower lobe lung mass, increased in size compared to CT A/P in 01/30/21. Denies systemic symptoms. No fever, chills, unintentional weight loss, night sweats. History of breast cancer >10 years ago s/p lumpectomy, chemo and radiation. Discharged from Oncology after surveillance monitoring was negative.    She smoked 1/2 -3/4 ppd x 10 years in her 86s. Secondhand smoke exposure with her husband. No known family hx lung cancer.  Pertinent  Medical History  breast cancer, CKD stage 3a, HLD, HTN, hypothyroidism, history of fall with orbital fracture, SAH and thoracic compression fracture in August of 2021   Significant Hospital Events: Including procedures, antibiotic start and stop dates in addition to other pertinent events   CT CAP with RLL lung mass 4.9 x 4.1 cm compared to 3.9 x 3.4 in 01/2021. Subpleural solid nodular opacity in RUL 1.2 x 0.7 cm. Small solid nodule in lingular 5 mm. Partly solid nodule in left apex 1.1 x1 cm. Nondisplaced fractures in left 7-9th rib. Unchanged compression fractures in T11-L1  Interim History / Subjective:  As above  Objective   Blood pressure (!) 165/91, pulse 75, temperature (!) 97.2 F (36.2 C), temperature source Oral, resp. rate 18, height 5\' 4"  (1.626 m), weight 49 kg, SpO2 98 %.        Intake/Output Summary (Last 24 hours) at 11/22/2022 1409 Last data filed at 11/22/2022 1000 Gross per 24 hour  Intake 600 ml  Output 0 ml  Net 600 ml   Filed Weights   11/21/22 1204 11/21/22 1211  Weight: 49 kg 49 kg   Physical Exam: General: Elderly  frail-appearing, no acute distress HENT: , AT Eyes: EOMI, no scleral icterus Respiratory: Clear to auscultation bilaterally.  No crackles, wheezing or rales Cardiovascular: RRR, -M/R/G, no JVD Extremities:-Edema,-tenderness Neuro: AAO x4, CNII-XII grossly intact Psych: Normal mood, normal affect   Labs reviewed personally by me. Unremakarble CBC and CMET except for CKD stage IIIa  CT imaging from 05/20/13 (no mass seen), 09/08/17, 01/30/21 and 11/21/22 from left to right.   Resolved Hospital Problem list   N/A  Assessment & Plan:   Right lower lobe lung mass with probable metastatic lesions Available imaging reviewed since 2014 with evidence of lung mass since 2018 slowly enlarging. Lung mass is highly concerning for malignancy. Low suspicion for infection/inflammatory etiology. We discussed diagnostic testing including bronchoscopy vs PET scan to evaluate for alternative biopsy site. CT guided biopsy considered but would have increased risk of pneumothorax compared to bronchoscopy. We discussed bronchoscopy risks and benefits of procedure including infection, bleeding and lung collapse. We also discussed utility in procedure and whether patient wished to pursue treatment if this was confirmed to be cancer. After addressing patient/family's questions including concerns regarding procedures and anesthesia, patient wishes to continue discussion with family.  Pulmonary will continue to follow  Best Practice (right click and "Reselect all SmartList Selections" daily)   Diet/type: Regular consistency (see orders) DVT prophylaxis: LMWH GI prophylaxis: N/A Lines: N/A Foley:  N/A Code Status:  full code Last date of multidisciplinary goals of care discussion []  Per primary  Labs   CBC: Recent Labs  Lab 11/21/22 1333  WBC 9.1  HGB 13.2  HCT 40.5  MCV 93.5  PLT 355    Basic Metabolic Panel: Recent Labs  Lab 11/21/22 1333  NA 137  K 3.9  CL 105  CO2 25  GLUCOSE 83  BUN 16   CREATININE 0.94  CALCIUM 9.6   GFR: Estimated Creatinine Clearance: 31.4 mL/min (by C-G formula based on SCr of 0.94 mg/dL). Recent Labs  Lab 11/21/22 1333  WBC 9.1    Liver Function Tests: Recent Labs  Lab 11/21/22 1333  AST 35  ALT 24  ALKPHOS 107  BILITOT 0.7  PROT 7.2  ALBUMIN 3.7   No results for input(s): "LIPASE", "AMYLASE" in the last 168 hours. No results for input(s): "AMMONIA" in the last 168 hours.  ABG    Component Value Date/Time   TCO2 20 (L) 04/29/2020 1121     Coagulation Profile: No results for input(s): "INR", "PROTIME" in the last 168 hours.  Cardiac Enzymes: No results for input(s): "CKTOTAL", "CKMB", "CKMBINDEX", "TROPONINI" in the last 168 hours.  HbA1C: Hgb A1c MFr Bld  Date/Time Value Ref Range Status  07/31/2022 09:11 AM 6.1 4.6 - 6.5 % Final    Comment:    Glycemic Control Guidelines for People with Diabetes:Non Diabetic:  <6%Goal of Therapy: <7%Additional Action Suggested:  >8%     CBG: No results for input(s): "GLUCAP" in the last 168 hours.  Review of Systems:   As above  Past Medical History:  She,  has a past medical history of Acute blood loss anemia, AKI (acute kidney injury) ([place]), Autoimmune disease ([place]), Back pain (05/04/2020), Barrett's esophagus, Basal cell carcinoma (BCC) in situ of skin (04/04/2020), Bone lesion, Breast cancer ([place]) (2010), Cellulitis of skin (12/31/2020), Cervical cancer ([place]), Chronic midline thoracic back pain (12/31/2020), Diarrhea (09/08/2017), Dizziness (02/22/2019), Dysphagia, Elevated serum creatinine (09/08/2017), Esophageal spasm (09/20/2013), Essential hypertension (03/21/2019), Fall at home, initial encounter (04/29/2020), Fracture, shoulder (03/06/2012), Glaucoma, Hoarseness of voice (02/28/2016), Humerus fracture (03/2012), meningioma of the brain (08/28/2017), Hypercalcemia, Hyperlipidemia, Hypertension, Hypothyroidism, Intracranial hemorrhage ([place]), Nausea vomiting and diarrhea (09/08/2017), Orbital  fracture, closed, initial encounter ([place]) (04/29/2020), Osteoporosis (03/18/2012), Paroxysmal SVT (supraventricular tachycardia) (12/23/2020), Pneumonia, PVC (premature ventricular contraction) (09/08/2017), Raynaud's phenomenon (02/28/2016), SAH (subarachnoid hemorrhage) ([place]) (04/29/2020), Squamous cell carcinoma in situ of skin (11/30/2018), Stage 3a chronic kidney disease, Systolic murmur (7/32/2025), and TBI (traumatic brain injury) ([place]) (05/04/2020).   Surgical History:   Past Surgical History:  Procedure Laterality Date   ABDOMINAL HYSTERECTOMY  1972   cancer          1 tube 1 ovary   APPENDECTOMY     BACK SURGERY  11/28/2020   BREAST LUMPECTOMY Left 2010   colon polyp removal     EYE SURGERY     HAND SURGERY     KYPHOPLASTY N/A 11/28/2020   Procedure: KYPHOPLASTY THORACIC TWELVE AND LUMBAR ONE;  Surgeon: Melina Schools, MD;  Location: [facility];  Service: Orthopedics;  Laterality: N/A;  90 mins   ortho procedures     multiple   OVARIAN CYST REMOVAL  1978   SHOULDER SURGERY  1954   left     Social History:   reports that she quit smoking about 35 years ago. Her smoking use included cigarettes. She has never used smokeless tobacco. She reports current alcohol use. She reports that she does not use drugs.   Family History:  Her family history includes Breast cancer in her paternal  aunt; Cancer in her brother, brother, paternal aunt, and sister; Heart disease in her father. There is no history of Osteoporosis.   Allergies Allergies  Allergen Reactions   Daypro [Oxaprozin] Swelling    Face and tongue   Prolia [Denosumab]     unknown     Home Medications  Prior to Admission medications   Medication Sig Start Date End Date Taking? Authorizing Provider  amLODipine (NORVASC) 2.5 MG tablet Take 1 tablet (2.5 mg total) by mouth daily. 01/29/22  Yes Copland, Gay Filler, MD  Calcium Carbonate-Vitamin D (CALCIUM + D PO) Take 1 capsule by mouth daily.    Yes [provider]   latanoprost (XALATAN) 0.005 % ophthalmic solution Place 1 drop into both eyes at bedtime.    Yes [provider]  levothyroxine (SYNTHROID) 75 MCG tablet TAKE 1 TABLET BY MOUTH DAILY BEFORE BREAKFAST 11/03/22  Yes Copland, Gay Filler, MD  Multiple Vitamin (MULTIVITAMIN WITH MINERALS) TABS tablet Take 1 tablet by mouth daily. One a day   Yes [provider]  rosuvastatin (CRESTOR) 10 MG tablet Take 1 tablet (10 mg total) by mouth daily. TAKE 1 BY MOUTH DAILY 01/29/22  Yes Copland, Gay Filler, MD     Critical care time:  N/A    Care Time: 38 min  Rodman Pickle, M.D. [facility] Pulmonary/Critical Care Medicine 11/22/2022 2:09 PM   See Amion for personal pager For hours between 7 PM to 7 AM, please call Elink for urgent questions

## 2022-11-22 NOTE — Progress Notes (Addendum)
PROGRESS NOTE   Lori CINA  Mccoy:[id number]    DOB: Nov 18, 1930    DOA: 11/21/2022  PCP: Darreld Mclean, MD   I have briefly reviewed patients previous [facility] in [facility].  Chief Complaint  Patient presents with   Fall    Brief Narrative:  89 year old married female, independent, medical history significant for HTN, HLD, hypothyroid, CKD 3A, breast cancer, sustained a mechanical fall at home on day prior to admission, fell down hitting the left side of her chest to the side of the tub, no head injury or LOC, presented to ED 11/21/2022 with complaints of left-sided chest pain.  Admitted for left multiple nondisplaced rib fractures with severe chest wall pain for pain control and mobilization.  Incidental right lower lobe mass on CT chest noted.   Assessment & Plan:  Principal Problem:   Rib fractures Active Problems:   Right lower lobe lung mass   Essential hypertension   Hypothyroidism   Hyperlipidemia   Stage 3a chronic kidney disease   Breast cancer (HCC)   Left-sided chest wall pain due to multiple left (7-9) nondisplaced rib fractures from mechanical fall: CT C/A/P with contrast 2/9: Nondisplaced fractures of the anterolateral left seventh-ninth ribs, without pneumothorax.  No other acute traumatic findings in the abdomen or pelvis.  Multimodality pain control: Scheduled Tylenol, Robaxin, lidocaine patch, as needed tramadol.  Aggressive incentive spirometry as tolerated.  Mobilize as able.  Since this morning, pain appears to be controlled only on Tylenol and Robaxin.  Has not received any opioids since last night.  Ambulated 400 feet with PT who recommends no follow-up at discharge.  Requested OT evaluation.  Large irregular mass of the right lower lobe/part solid and small solid pulmonary nodules: Incidentally seen on CT C/A/P on 2/9.  Increased in size when compared to prior CT and concerning for primary lung malignancy.  Discussed findings with patient (was  unaware of this until this hospital admission).  Likely for close outpatient follow-up with PCP/pulmonology to determine need for further workup if she is a candidate for treatment if it turns out to be malignant.?  Initial PET scan as outpatient.  CT chest also shows subpleural reticular opacities and traction bronchiectasis,?  ILD.  Pulmonology consulted, input appreciated.  They were concerned that the lesion is probably a metastatic lesion.  They discussed with patient/family regarding bronchoscopy versus PET scan.  Patient and family have decided on proceeding with bronchoscopy and biopsy.  As per Dr. Loanne Drilling, PCCM, procedure is arranged for 11/24/2022 at 1:30 PM and recommends patient be transferred to [facility] and to remain at [facility] postprocedure.  Updated patient and family.  Essential hypertension: Continue home dose of amlodipine 2.5 Mg daily.  Mildly uncontrolled at times, likely related to pain.  Could increase amlodipine to 5 Mg daily if persists.  Hypothyroidism: TSH 4.4 in 07/2022.  Continue Synthroid 75 mcg daily.  Hyperlipidemia: Continue rosuvastatin  Stage IIIa chronic kidney disease: Creatinine normal and at baseline.  Breast cancer: S/p lumpectomy in 2010 with radiation.  Anastrozole x 5 years.  Reported to be in remission.  Body mass index is 18.54 kg/m.    DVT prophylaxis: enoxaparin (LOVENOX) injection 40 mg Start: 11/21/22 2200     Code Status: Full Code:  Family Communication: Discussed multiple times and extensively with daughter who is a Tree surgeon at [employer].  Updated care and answered all questions.. Disposition:  Status is: Observation The patient remains OBS appropriate and will d/c before 2 midnights.  Consultants:     Procedures:     Antimicrobials:      Subjective:  Seen this morning along with patient's female nurse and RN as chaperone.  Reports that she had severe left-sided chest pain early this morning.  This is somewhat improved  after pain meds which have made her woozy and nauseous.  Pain worse with movement in bed or deep inspiration.  Objective:   Vitals:   11/21/22 1615 11/21/22 2019 11/22/22 0145 11/22/22 0530  BP: (!) 149/91 (!) 147/81 119/75 (!) 174/79  Pulse: 70 70 (!) 57 (!) 55  Resp: 16 16 18 18   Temp: 97.9 F (36.6 C) 98 F (36.7 C) (!) 97.5 F (36.4 C) (!) 97.3 F (36.3 C)  TempSrc:  Oral Oral Oral  SpO2: 98% 98% 99% 100%  Weight:      Height:        General exam: Elderly female, small built, frail, sitting up in bed, appears somewhat uncomfortable but in no obvious respiratory distress or painful distress unless she moves or takes a deep breath. Respiratory system: Clear to auscultation. Respiratory effort normal.  Left lower lateral chest wall tenderness with no external bruising.  On my arrival, patient actually had a band of clot that was tied around her upper chest wall to control pain. Cardiovascular system: S1 & S2 heard, RRR. No JVD, murmurs, rubs, gallops or clicks. No pedal edema. Gastrointestinal system: Abdomen is nondistended, soft and nontender. No organomegaly or masses felt. Normal bowel sounds heard. Central nervous system: Alert and oriented. No focal neurological deficits. Extremities: Symmetric 5 x 5 power. Skin: No rashes, lesions or ulcers Psychiatry: Judgement and insight appear normal. Mood & affect appropriate.     Data Reviewed:   I have personally reviewed following labs and imaging studies   CBC: Recent Labs  Lab 11/21/22 1333  WBC 9.1  HGB 13.2  HCT 40.5  MCV 93.5  PLT 947    Basic Metabolic Panel: Recent Labs  Lab 11/21/22 1333  NA 137  K 3.9  CL 105  CO2 25  GLUCOSE 83  BUN 16  CREATININE 0.94  CALCIUM 9.6    Liver Function Tests: Recent Labs  Lab 11/21/22 1333  AST 35  ALT 24  ALKPHOS 107  BILITOT 0.7  PROT 7.2  ALBUMIN 3.7    CBG: No results for input(s): "GLUCAP" in the last 168 hours.  Microbiology Studies:  No results  found for this or any previous visit (from the past 240 hour(s)).  Radiology Studies:  CT CHEST ABDOMEN PELVIS W CONTRAST  Result Date: 11/21/2022 CLINICAL DATA:  Fall yesterday afternoon; * Tracking Code: BO * EXAM: CT CHEST, ABDOMEN, AND PELVIS WITH CONTRAST TECHNIQUE: Multidetector CT imaging of the chest, abdomen and pelvis was performed following the standard protocol during bolus administration of intravenous contrast. RADIATION DOSE REDUCTION: This exam was performed according to the departmental dose-optimization program which includes automated exposure control, adjustment of the mA and/or kV according to patient size and/or use of iterative reconstruction technique. CONTRAST:  159mL OMNIPAQUE IOHEXOL 300 MG/ML  SOLN COMPARISON:  CT abdomen and pelvis dated February 07, 2021 FINDINGS: CT CHEST FINDINGS Cardiovascular: Normal heart size. Small pericardial effusion. Normal caliber thoracic aorta with moderate atherosclerotic disease. Mediastinum/Nodes: Small hiatal hernia and patulous esophagus. Thyroid is unremarkable. No pathologically enlarged lymph nodes seen in the chest. Lungs/Pleura: Central airways are patent. Mild subpleural reticular opacities with associated traction bronchiolectasis. Large irregular mass of the right lower lobe with air  bronchograms and adjacent pleural thickening measuring 4.9 x 4.1 cm on series 4 image 95, finding was partially visualized on prior CT and is increased in size, visualized portion measured 3.9 x 3.4 cm. Small subpleural solid nodular opacity of the anterior right upper lobe measuring 1.2 x 0.7 cm on series 4, image 49. Small solid nodule of the lingula measuring 5 mm on series 4, image 71. Part solid nodule of the left lung apex measuring 2.1 x 1.6 cm in overall diameter with approximate 1.1 x 0.9 cm solid component. Trace bilateral pleural effusions. No evidence of pneumothorax. Musculoskeletal: Nondisplaced fractures of the anterolateral left 7th-9th ribs.  Severe compression deformities of T11, T12 and L1 with vertebroplasty changes at T12 and L1, unchanged when compared with prior exam. CT ABDOMEN PELVIS FINDINGS Hepatobiliary: No hepatic injury or perihepatic hematoma. Unchanged low-attenuation lesions of the right lobe of the liver which are likely simple cysts. Gallbladder is unremarkable. Pancreas: Unremarkable. No pancreatic ductal dilatation or surrounding inflammatory changes. Spleen: Normal in size without focal abnormality. Adrenals/Urinary Tract: Bilateral adrenal glands are unremarkable. No hydronephrosis or nephrolithiasis. Bilateral low-attenuation renal lesions which are too small to accurately characterize but likely simple cysts, no specific follow-up imaging is recommended. Bladder is unremarkable. Stomach/Bowel: Stomach is within normal limits. Severe diverticulosis. No evidence of bowel wall thickening, distention, or inflammatory changes. Vascular/Lymphatic: Aortic atherosclerosis. No enlarged abdominal or pelvic lymph nodes. Reproductive: Status post hysterectomy. No adnexal masses. Other: No abdominal wall hernia or abnormality. Trace free fluid seen in the right upper quadrant on series 2, image 65. Musculoskeletal: No acute or significant osseous findings. IMPRESSION: 1. Nondisplaced fractures of the anterolateral left 7th-9th ribs. No evidence of pneumothorax. 2. No acute traumatic findings in the abdomen or pelvis. Trace free fluid is seen in the right upper quadrant of the abdomen which is nonspecific given no evidence of solid organ injury. 3. Large irregular mass of the right lower lobe, increased in size when compared with prior CT and concerning for primary lung malignancy. Additional imaging evaluation or consultation with Pulmonology or Thoracic Surgery recommended. 4. Additional part solid and small solid pulmonary nodules are seen in the chest. Recommend attention on follow-up. 5. Small pericardial effusion and trace bilateral  pleural effusions. 6. Subpleural reticular opacities and traction bronchiectasis, findings can be seen in setting of interstitial lung disease. 7. Aortic Atherosclerosis (ICD10-I70.0). Electronically Signed   By: Yetta Glassman M.D.   On: 11/21/2022 15:47   DG Ribs Unilateral W/Chest Left  Result Date: 11/21/2022 CLINICAL DATA:  Mechanical fall yesterday afternoon, slipped on rug, struck LEFT ribs, woke up today with increased pain with movement and deep breathing EXAM: LEFT RIBS AND CHEST - 3+ VIEW COMPARISON:  09/20/2021 FINDINGS: Enlargement of cardiac silhouette. Mediastinal contours and pulmonary vascularity normal. Minimal persistent LEFT basilar atelectasis. Increased opacity at lateral RIGHT lung base since 2022, concerning for mass. Remaining lungs clear. No pleural effusion or pneumothorax. Bones demineralized. BB placed at site of symptoms lower LEFT ribs. Minimally displaced fracture of the anterolateral LEFT ninth rib. Questionable nondisplaced fracture LEFT seventh rib. IMPRESSION: Fractures of LEFT ninth and questionably LEFT seventh ribs. Increased opacity at lateral RIGHT lung base since 2022, concerning for tumor; CT chest recommended for further evaluation, with contrast if patient's renal function permits. Findings called to Dr.  Alvino Chapel on 11/21/2022 at 1246 hours. Electronically Signed   By: Lavonia Dana M.D.   On: 11/21/2022 12:47    Scheduled Meds:    amLODipine  2.5 mg  Oral Daily   enoxaparin (LOVENOX) injection  40 mg Subcutaneous QHS   latanoprost  1 drop Both Eyes QHS   levothyroxine  75 mcg Oral Q0600   lidocaine  2 patch Transdermal Q24H   rosuvastatin  10 mg Oral Daily   sodium chloride flush  3 mL Intravenous Q12H    Continuous Infusions:    sodium chloride     methocarbamol (ROBAXIN) IV       LOS: 0 days     Vernell Leep, MD,  FACP, FHM, SFHM, [facility], [place]     To contact the attending  provider between 7A-7P or the covering provider during after hours 7P-7A, please log into the web site [email or web address] and access using universal [place] password for that web site. If you do not have the password, please call the hospital operator.  11/22/2022, 8:46 AM

## 2022-11-22 NOTE — Progress Notes (Signed)
Pt had nausea No current orders were in place Sent secure message to provider on call. Order received.

## 2022-11-22 NOTE — Evaluation (Signed)
Physical Therapy Evaluation Patient Details Name: [name] MRN: [id number] DOB: [birth year] Today's Date: 11/22/2022  History of Present Illness  Pt admitted from home s/p fall with nondisplaced L rib 7-9 fxs.  Pt with hx of CKD, Breast CA, and T-12, L-1 compression fx with fall 8/21  Clinical Impression  Pt admitted as above and presenting with functional mobility limitations 2* ongoing rib pain with mobility and ambulatory balance deficits.  This date, pt up to ambulate 400' with HHA for balance.  Pt states she has a RW at home that she used after prior fall in 8/21 and realizes it will be safer to use AD initially on return home.       Recommendations for follow up therapy are one component of a multi-disciplinary discharge planning process, led by the attending physician.  Recommendations may be updated based on patient status, additional functional criteria and insurance authorization.  Follow Up Recommendations No PT follow up      Assistance Recommended at Discharge Intermittent Supervision/Assistance  Patient can return home with the following  A little help with walking and/or transfers;A little help with bathing/dressing/bathroom;Assistance with cooking/housework;Help with stairs or ramp for entrance;Assist for transportation    Equipment Recommendations None recommended by PT  Recommendations for Other Services       Functional Status Assessment Patient has had a recent decline in their functional status and demonstrates the ability to make significant improvements in function in a reasonable and predictable amount of time.     Precautions / Restrictions Precautions Precautions: Fall Restrictions Weight Bearing Restrictions: No      Mobility  Bed Mobility Overal bed mobility: Needs Assistance Bed Mobility: Supine to Sit           General bed mobility comments: min assist to bring trunk to upright    Transfers Overall transfer level: Needs  assistance Equipment used: 1 person hand held assist Transfers: Sit to/from Stand Sit to Stand: Min guard           General transfer comment: steady assist    Ambulation/Gait Ambulation/Gait assistance: Min assist Gait Distance (Feet): 400 Feet Assistive device: 1 person hand held assist Gait Pattern/deviations: Step-through pattern, Decreased step length - right, Decreased step length - left, Staggering left, Staggering right, Shuffle       General Gait Details: General instability with occasional mild balance loss requring assist to steady.  Pt requesting to ambulate sans AD but states use of RW at home may be beneficial initially  Science writer    Modified Rankin (Stroke Patients Only)       Balance Overall balance assessment: Needs assistance Sitting-balance support: No upper extremity supported, Feet supported Sitting balance-Leahy Scale: Fair     Standing balance support: No upper extremity supported Standing balance-Leahy Scale: Fair                               Pertinent Vitals/Pain Pain Assessment Pain Assessment: Faces Faces Pain Scale: Hurts little more Pain Location: L thorax with movement Pain Descriptors / Indicators: Grimacing Pain Intervention(s): Limited activity within patient's tolerance, Monitored during session    Home Living Family/patient expects to be discharged to:: Private residence Living Arrangements: Spouse/significant other Available Help at Discharge: Family;Available 24 hours/day Type of Home: House Home Access: Stairs to enter Entrance Stairs-Rails: Right;Left;Can reach both Entrance Stairs-Number of Steps: 4  Home Layout: One level Home Equipment: Conservation officer, nature (2 wheels);Shower seat;Grab bars - tub/shower      Prior Function Prior Level of Function : Independent/Modified Independent             Mobility Comments: Pt states walks several miles daily sans AD        Hand Dominance   Dominant Hand: Right    Extremity/Trunk Assessment   Upper Extremity Assessment Upper Extremity Assessment: Overall WFL for tasks assessed    Lower Extremity Assessment Lower Extremity Assessment: Overall WFL for tasks assessed    Cervical / Trunk Assessment Cervical / Trunk Assessment: Kyphotic  Communication   Communication: No difficulties  Cognition Arousal/Alertness: Awake/alert Behavior During Therapy: WFL for tasks assessed/performed Overall Cognitive Status: Within Functional Limits for tasks assessed                                          General Comments      Exercises     Assessment/Plan    PT Assessment Patient needs continued PT services  PT Problem List Decreased activity tolerance;Decreased balance;Decreased mobility;Decreased knowledge of use of DME;Pain       PT Treatment Interventions DME instruction;Gait training;Stair training;Functional mobility training;Therapeutic activities;Therapeutic exercise;Patient/family education    PT Goals (Current goals can be found in the Care Plan section)  Acute Rehab PT Goals Patient Stated Goal: Regain IND and get back to walking PT Goal Formulation: With patient Time For Goal Achievement: 12/06/22 Potential to Achieve Goals: Good    Frequency Min 3X/week     Co-evaluation               AM-PAC PT "6 Clicks" Mobility  Outcome Measure Help needed turning from your back to your side while in a flat bed without using bedrails?: None Help needed moving from lying on your back to sitting on the side of a flat bed without using bedrails?: A Little Help needed moving to and from a bed to a chair (including a wheelchair)?: A Little Help needed standing up from a chair using your arms (e.g., wheelchair or bedside chair)?: A Little Help needed to walk in hospital room?: A Little Help needed climbing 3-5 steps with a railing? : A Little 6 Click Score: 19    End of  Session Equipment Utilized During Treatment: Gait belt Activity Tolerance: Patient tolerated treatment well Patient left: in chair;with call bell/phone within reach;with family/visitor present;Other (comment) (Physician in room) Nurse Communication: Mobility status PT Visit Diagnosis: Unsteadiness on feet (R26.81);Difficulty in walking, not elsewhere classified (R26.2)    Time: 1417-1430 PT Time Calculation (min) (ACUTE ONLY): 13 min   Charges:   PT Evaluation $PT Eval Low Complexity: 1 Low          [place] Pager [phone] Office [phone]   Undra Harriman 11/22/2022, 2:45 PM

## 2022-11-22 NOTE — Progress Notes (Signed)
  Transition of Care [facility]) Screening Note   Patient Details  Name: [name] Date of Birth: 1931/02/13   Transition of Care [facility]) CM/SW Contact:    Henrietta Dine, RN Phone Number: 11/22/2022, 5:12 PM    Transition of Care Department [facility]) has reviewed patient and no TOC needs have been identified at this time. We will continue to monitor patient advancement through interdisciplinary progression rounds. If new patient transition needs arise, please place a TOC consult.

## 2022-11-23 ENCOUNTER — Telehealth: Payer: Self-pay | Admitting: Pulmonary Disease

## 2022-11-23 DIAGNOSIS — S2242XD Multiple fractures of ribs, left side, subsequent encounter for fracture with routine healing: Secondary | ICD-10-CM | POA: Diagnosis not present

## 2022-11-23 DIAGNOSIS — R918 Other nonspecific abnormal finding of lung field: Secondary | ICD-10-CM | POA: Diagnosis not present

## 2022-11-23 NOTE — Telephone Encounter (Signed)
Please schedule patient for follow-up with me before the end of February. Ok to book blocked spot. Prefer 30 min but ok with 15 min.

## 2022-11-23 NOTE — Evaluation (Signed)
Occupational Therapy Evaluation Patient Details Name: [name] MRN: [id number] DOB: 09-08-1931 Today's Date: 11/23/2022   History of Present Illness Pt admitted from home s/p fall with nondisplaced L rib 7-9 fxs.  Pt with hx of CKD, Breast CA, and T-12, L-1 compression fx with fall 8/21   Clinical Impression   Mrs. [name] is an 89 year old woman who presents with pain. On evaluation she demonstrates ability to get in and out of bed, ambulate in room and perform ADLs. She was in pain but still able to perform functional activities. She has 24/7 assist of her husband, a walker at home and a handicap accessible bathroom. From an OT standpoint she has no needs. She can return home with family assist.      Recommendations for follow up therapy are one component of a multi-disciplinary discharge planning process, led by the attending physician.  Recommendations may be updated based on patient status, additional functional criteria and insurance authorization.   Follow Up Recommendations  No OT follow up     Assistance Recommended at Discharge PRN  Patient can return home with the following Assistance with cooking/housework;Help with stairs or ramp for entrance    Functional Status Assessment  Patient has had a recent decline in their functional status and demonstrates the ability to make significant improvements in function in a reasonable and predictable amount of time.  Equipment Recommendations  None recommended by OT    Recommendations for Other Services       Precautions / Restrictions Precautions Precautions: Fall Restrictions Weight Bearing Restrictions: No      Mobility Bed Mobility Overal bed mobility: Modified Independent             General bed mobility comments: use of bed rail and increased time. Reports she has been sleeping in recliner at home    Transfers Overall transfer level: Modified independent Equipment used: Rolling walker (2 wheels),  None               General transfer comment: Ambulated to bathroom with walker and then used hand holds to return. No overt loss of balance.      Balance Overall balance assessment: Mild deficits observed, not formally tested                                         ADL either performed or assessed with clinical judgement   ADL Overall ADL's : Modified independent                                       General ADL Comments: Has pain but able to perform     Vision Patient Visual Report: No change from baseline       Perception     Praxis      Pertinent Vitals/Pain Pain Assessment Pain Assessment: 0-10 Pain Score: 5  Pain Location: L thorax with movement Pain Descriptors / Indicators: Grimacing Pain Intervention(s): Limited activity within patient's tolerance     Hand Dominance Right   Extremity/Trunk Assessment Upper Extremity Assessment Upper Extremity Assessment: Overall WFL for tasks assessed   Lower Extremity Assessment Lower Extremity Assessment: Defer to PT evaluation   Cervical / Trunk Assessment Cervical / Trunk Assessment: Kyphotic   Communication Communication Communication: No difficulties   Cognition Arousal/Alertness: Awake/alert  Behavior During Therapy: WFL for tasks assessed/performed Overall Cognitive Status: Within Functional Limits for tasks assessed                                       General Comments       Exercises     Shoulder Instructions      Home Living Family/patient expects to be discharged to:: Private residence Living Arrangements: Spouse/significant other Available Help at Discharge: Family;Available 24 hours/day Type of Home: House Home Access: Stairs to enter [employer] of Steps: 4 Entrance Stairs-Rails: Right;Left;Can reach both Home Layout: One level     Bathroom Shower/Tub: Tub/shower unit;Walk-in shower   Bathroom Toilet: Handicapped  height Bathroom Accessibility: Yes   Home Equipment: Conservation officer, nature (2 wheels);Shower seat;Grab bars - tub/shower          Prior Functioning/Environment Prior Level of Function : Independent/Modified Independent             Mobility Comments: Pt states walks several miles daily sans AD ADLs Comments: indepednent        OT Problem List: Pain      OT Treatment/Interventions:      OT Goals(Current goals can be found in the care plan section) Acute Rehab OT Goals OT Goal Formulation: All assessment and education complete, DC therapy  OT Frequency:      Co-evaluation              AM-PAC OT "6 Clicks" Daily Activity     Outcome Measure Help from another person eating meals?: None Help from another person taking care of personal grooming?: None Help from another person toileting, which includes using toliet, bedpan, or urinal?: None Help from another person bathing (including washing, rinsing, drying)?: None Help from another person to put on and taking off regular upper body clothing?: None Help from another person to put on and taking off regular lower body clothing?: None 6 Click Score: 24   End of Session Equipment Utilized During Treatment: Rolling walker (2 wheels) Nurse Communication: Mobility status  Activity Tolerance: Patient tolerated treatment well Patient left: in bed;with bed alarm set  OT Visit Diagnosis: Pain                Time: 1219-7588 OT Time Calculation (min): 18 min Charges:  OT General Charges $OT Visit: 1 Visit OT Evaluation $OT Eval Low Complexity: 1 Low  Gustavo Lah, OTR/L Acute Care Rehab Services  Office [phone]   Lenward Chancellor 11/23/2022, 10:34 AM

## 2022-11-23 NOTE — Progress Notes (Signed)
Pt called nurse in the room stating that she and daughter wanted to speak with nurse. Nurse entered room. Pt had daughter on the phone- speaker. Daughter stated that she and her mother had though about things and they want to cancel the transfer and that they wanted to hold off on having the biopsy done, for now. Told them both that the nurse will update the on call provider, with this information. They both stated okay.

## 2022-11-23 NOTE — Progress Notes (Signed)
PROGRESS NOTE   [name]  TIR:[id number]    DOB: [birth year]    DOA: 11/21/2022  PCP: Lori Mclean, MD   I have briefly reviewed patients previous medical records in [facility].  Chief Complaint  Patient presents with   Fall    Brief Narrative:  89 year old married female, independent, medical history significant for HTN, HLD, hypothyroid, CKD 3A, breast cancer, sustained a mechanical fall at home on day prior to admission, fell down hitting the left side of her chest to the side of the tub, no head injury or LOC, presented to ED 11/21/2022 with complaints of left-sided chest pain.  Admitted for left multiple nondisplaced rib fractures with severe chest wall pain for pain control and mobilization.  Incidental right lower lobe mass on CT chest noted.  Pulmonology consulted.  Patient was initially planning to undergo bronchoscopy and biopsy while in the hospital but has finally changed her mind and now plans to follow-up regarding this as outpatient.  PT and OT have seen and have no outpatient recommendations.  Patient however states that she is not ready due to ongoing pain and family considering SNF for STR (may have to private pay).  TOC consulted.   Assessment & Plan:  Principal Problem:   Rib fractures Active Problems:   Right lower lobe lung mass   Essential hypertension   Hypothyroidism   Hyperlipidemia   Stage 3a chronic kidney disease   Breast cancer (HCC)   Left-sided chest wall pain due to multiple left (7-9) nondisplaced rib fractures from mechanical fall: CT C/A/P with contrast 2/9: Nondisplaced fractures of the anterolateral left seventh-ninth ribs, without pneumothorax.  No other acute traumatic findings in the abdomen or pelvis.  Multimodality pain control: Scheduled Tylenol, Robaxin, lidocaine patch, as needed tramadol.  Aggressive incentive spirometry as tolerated.  Mobilize as able.  Ambulated 400 feet with PT yesterday.  PT and OT recommend no outpatient  follow-up.  Apart from above known opioids, has only received a single dose of tramadol at around noon today.  Feel that patient can manage at home with oral pain meds.  However patient states that she is not ready for discharge due to ongoing pain.  Also daughter indicates that family is pursuing possible SNF for STR and aware that this may be a private pay.  Continue multimodality pain management, reassess in a.m. for possible DC.  Large irregular mass of the right lower lobe/part solid and small solid pulmonary nodules: Incidentally seen on CT C/A/P on 2/9.  Increased in size when compared to prior CT and concerning for primary lung malignancy.  Discussed findings with patient (was unaware of this until this hospital admission).  Likely for close outpatient follow-up with PCP/pulmonology to determine need for further workup if she is a candidate for treatment if it turns out to be malignant.?  Initial PET scan as outpatient.  CT chest also shows subpleural reticular opacities and traction bronchiectasis,?  ILD.  Pulmonology consulted, input appreciated.  They were concerned that the lesion is probably a metastatic lesion.  They discussed with patient/family regarding bronchoscopy versus PET scan.  Patient and family had initially decided on proceeding with inpatient bronchoscopy and biopsy but overnight they have changed their mind and plan to follow-up regarding this as outpatient.  They were given options of either following up at [facility] (where daughter works) or with [facility] pulmonology.  Essential hypertension: Continue home dose of amlodipine 2.5 Mg daily.  Better controlled today.  Hypothyroidism: TSH 4.4  in 07/2022.  Continue Synthroid 75 mcg daily.  Hyperlipidemia: Continue rosuvastatin  Stage IIIa chronic kidney disease: Creatinine normal and at baseline.  Breast cancer: S/p lumpectomy in 2010 with radiation.  Anastrozole x 5 years.  Reported to be in remission.  Body mass index is  18.54 kg/m.    DVT prophylaxis: enoxaparin (LOVENOX) injection 40 mg Start: 11/21/22 2200     Code Status: Full Code:  Family Communication: Discussed in detail with patient's daughter via phone, updated care and answered all questions and advised her that from a medical standpoint, patient is optimized for DC. Disposition:  Patient is medically optimized for DC but patient reluctant to leave due to pain and family considering STR SNF.     Consultants:   Pulmonology  Procedures:     Antimicrobials:      Subjective:  Seen this morning along with OT in the room.  Reports ongoing left-sided chest wall pain, improved significantly with pain medications.  Nausea and vomiting at around 4 PM yesterday but none since.  Was able to sit up at edge of bed by herself with minimal assistance without much difficulty [name] presents.  Objective:   Vitals:   11/22/22 1805 11/22/22 1943 11/23/22 0559 11/23/22 1151  BP: 137/75 (!) 179/69 (!) 149/81 130/68  Pulse: 70 70 71 75  Resp: 14 18 18 16   Temp: 98 F (36.7 C) 98 F (36.7 C) 98.2 F (36.8 C) (!) 97.3 F (36.3 C)  TempSrc: Oral Oral Oral Oral  SpO2: 97% 98% 100% 100%  Weight:      Height:        General exam: Elderly female, small built, frail, lying comfortably propped up in bed without distress.  Did not appear to be in pain or respiratory distress.  Was able to get up from propped up position to edge of bed with minimal assistance by OT. Respiratory system: Clear to auscultation. Respiratory effort normal.  Left lower lateral chest wall tenderness with no external bruising (as examined on 2/10).   Cardiovascular system: S1 & S2 heard, RRR. No JVD, murmurs, rubs, gallops or clicks. No pedal edema. Gastrointestinal system: Abdomen is nondistended, soft and nontender. No organomegaly or masses felt. Normal bowel sounds heard. Central nervous system: Alert and oriented. No focal neurological deficits. Extremities: Symmetric 5 x 5  power. Skin: No rashes, lesions or ulcers Psychiatry: Judgement and insight appear normal. Mood & affect appropriate.     Data Reviewed:   I have personally reviewed following labs and imaging studies   CBC: Recent Labs  Lab 11/21/22 1333  WBC 9.1  HGB 13.2  HCT 40.5  MCV 93.5  PLT 202    Basic Metabolic Panel: Recent Labs  Lab 11/21/22 1333  NA 137  K 3.9  CL 105  CO2 25  GLUCOSE 83  BUN 16  CREATININE 0.94  CALCIUM 9.6    Liver Function Tests: Recent Labs  Lab 11/21/22 1333  AST 35  ALT 24  ALKPHOS 107  BILITOT 0.7  PROT 7.2  ALBUMIN 3.7    CBG: No results for input(s): "GLUCAP" in the last 168 hours.  Microbiology Studies:   Recent Results (from the past 240 hour(s))  SARS Coronavirus 2 by RT PCR (hospital order, performed in [facility] hospital lab) *cepheid single result test* Anterior Nasal Swab     Status: None   Collection Time: 11/22/22  6:53 PM   Specimen: Anterior Nasal Swab  Result Value Ref Range Status   SARS  Coronavirus 2 by RT PCR NEGATIVE NEGATIVE Final    Comment: (NOTE) SARS-CoV-2 target nucleic acids are NOT DETECTED.  The SARS-CoV-2 RNA is generally detectable in upper and lower respiratory specimens during the acute phase of infection. The lowest concentration of SARS-CoV-2 viral copies this assay can detect is 250 copies / mL. A negative result does not preclude SARS-CoV-2 infection and should not be used as the sole basis for treatment or other patient management decisions.  A negative result may occur with improper specimen collection / handling, submission of specimen other than nasopharyngeal swab, presence of viral mutation(s) within the areas targeted by this assay, and inadequate number of viral copies (<250 copies / mL). A negative result must be combined with clinical observations, patient history, and epidemiological information.  Fact Sheet for Patients:   https://www.patel.info/  Fact  Sheet for Healthcare Providers: https://hall.com/  This test is not yet approved or  cleared by the [place] FDA and has been authorized for detection and/or diagnosis of SARS-CoV-2 by FDA under an Emergency Use Authorization (EUA).  This EUA will remain in effect (meaning this test can be used) for the duration of the COVID-19 declaration under Section 564(b)(1) of the Act, 21 U.S.C. section 360bbb-3(b)(1), unless the authorization is terminated or revoked sooner.  Performed at [facility], [place] [address], [place], [place] [address]     Radiology Studies:  CT CHEST ABDOMEN PELVIS W CONTRAST  Result Date: 11/21/2022 CLINICAL DATA:  Fall yesterday afternoon; * Tracking Code: BO * EXAM: CT CHEST, ABDOMEN, AND PELVIS WITH CONTRAST TECHNIQUE: Multidetector CT imaging of the chest, abdomen and pelvis was performed following the standard protocol during bolus administration of intravenous contrast. RADIATION DOSE REDUCTION: This exam was performed according to the departmental dose-optimization program which includes automated exposure control, adjustment of the mA and/or kV according to patient size and/or use of iterative reconstruction technique. CONTRAST:  128mL OMNIPAQUE IOHEXOL 300 MG/ML  SOLN COMPARISON:  CT abdomen and pelvis dated February 07, 2021 FINDINGS: CT CHEST FINDINGS Cardiovascular: Normal heart size. Small pericardial effusion. Normal caliber thoracic aorta with moderate atherosclerotic disease. Mediastinum/Nodes: Small hiatal hernia and patulous esophagus. Thyroid is unremarkable. No pathologically enlarged lymph nodes seen in the chest. Lungs/Pleura: Central airways are patent. Mild subpleural reticular opacities with associated traction bronchiolectasis. Large irregular mass of the right lower lobe with air bronchograms and adjacent pleural thickening measuring 4.9 x 4.1 cm on series 4 image 95, finding was partially visualized on prior  CT and is increased in size, visualized portion measured 3.9 x 3.4 cm. Small subpleural solid nodular opacity of the anterior right upper lobe measuring 1.2 x 0.7 cm on series 4, image 49. Small solid nodule of the lingula measuring 5 mm on series 4, image 71. Part solid nodule of the left lung apex measuring 2.1 x 1.6 cm in overall diameter with approximate 1.1 x 0.9 cm solid component. Trace bilateral pleural effusions. No evidence of pneumothorax. Musculoskeletal: Nondisplaced fractures of the anterolateral left 7th-9th ribs. Severe compression deformities of T11, T12 and L1 with vertebroplasty changes at T12 and L1, unchanged when compared with prior exam. CT ABDOMEN PELVIS FINDINGS Hepatobiliary: No hepatic injury or perihepatic hematoma. Unchanged low-attenuation lesions of the right lobe of the liver which are likely simple cysts. Gallbladder is unremarkable. Pancreas: Unremarkable. No pancreatic ductal dilatation or surrounding inflammatory changes. Spleen: Normal in size without focal abnormality. Adrenals/Urinary Tract: Bilateral adrenal glands are unremarkable. No hydronephrosis or nephrolithiasis. Bilateral low-attenuation renal lesions which  are too small to accurately characterize but likely simple cysts, no specific follow-up imaging is recommended. Bladder is unremarkable. Stomach/Bowel: Stomach is within normal limits. Severe diverticulosis. No evidence of bowel wall thickening, distention, or inflammatory changes. Vascular/Lymphatic: Aortic atherosclerosis. No enlarged abdominal or pelvic lymph nodes. Reproductive: Status post hysterectomy. No adnexal masses. Other: No abdominal wall hernia or abnormality. Trace free fluid seen in the right upper quadrant on series 2, image 65. Musculoskeletal: No acute or significant osseous findings. IMPRESSION: 1. Nondisplaced fractures of the anterolateral left 7th-9th ribs. No evidence of pneumothorax. 2. No acute traumatic findings in the abdomen or pelvis.  Trace free fluid is seen in the right upper quadrant of the abdomen which is nonspecific given no evidence of solid organ injury. 3. Large irregular mass of the right lower lobe, increased in size when compared with prior CT and concerning for primary lung malignancy. Additional imaging evaluation or consultation with Pulmonology or Thoracic Surgery recommended. 4. Additional part solid and small solid pulmonary nodules are seen in the chest. Recommend attention on follow-up. 5. Small pericardial effusion and trace bilateral pleural effusions. 6. Subpleural reticular opacities and traction bronchiectasis, findings can be seen in setting of interstitial lung disease. 7. Aortic Atherosclerosis (ICD10-I70.0). Electronically Signed   By: Yetta Glassman M.D.   On: 11/21/2022 15:47   DG Ribs Unilateral W/Chest Left  Result Date: 11/21/2022 CLINICAL DATA:  Mechanical fall yesterday afternoon, slipped on rug, struck LEFT ribs, woke up today with increased pain with movement and deep breathing EXAM: LEFT RIBS AND CHEST - 3+ VIEW COMPARISON:  09/20/2021 FINDINGS: Enlargement of cardiac silhouette. Mediastinal contours and pulmonary vascularity normal. Minimal persistent LEFT basilar atelectasis. Increased opacity at lateral RIGHT lung base since 2022, concerning for mass. Remaining lungs clear. No pleural effusion or pneumothorax. Bones demineralized. BB placed at site of symptoms lower LEFT ribs. Minimally displaced fracture of the anterolateral LEFT ninth rib. Questionable nondisplaced fracture LEFT seventh rib. IMPRESSION: Fractures of LEFT ninth and questionably LEFT seventh ribs. Increased opacity at lateral RIGHT lung base since 2022, concerning for tumor; CT chest recommended for further evaluation, with contrast if patient's renal function permits. Findings called to Dr.  Alvino Chapel on 11/21/2022 at 1246 hours. Electronically Signed   By: Lavonia Dana M.D.   On: 11/21/2022 12:47    Scheduled Meds:     acetaminophen  1,000 mg Oral TID   amLODipine  2.5 mg Oral Daily   enoxaparin (LOVENOX) injection  40 mg Subcutaneous QHS   latanoprost  1 drop Both Eyes QHS   levothyroxine  75 mcg Oral Q0600   lidocaine  2 patch Transdermal Q24H   methocarbamol  500 mg Oral TID   polyethylene glycol  17 g Oral Daily   rosuvastatin  10 mg Oral Daily   sodium chloride flush  3 mL Intravenous Q12H    Continuous Infusions:    sodium chloride       LOS: 0 days     Vernell Leep, MD,  FACP, FHM, SFHM, [facility], [place]     To contact the attending provider between 7A-7P or the covering provider during after hours 7P-7A, please log into the web site [email or web address] and access using universal [place] password for that web site. If you do not have the password, please call the hospital operator.  11/23/2022, 12:32 PM

## 2022-11-23 NOTE — Progress Notes (Signed)
Dr. Sidney Ace- on call, was updated about pt's request. He stated the following: Please pass to am nurse for am doc at [facility].

## 2022-11-23 NOTE — Progress Notes (Signed)
Mobility Specialist - Progress Note   11/23/22 1411  Mobility  Activity Ambulated with assistance in hallway  Level of Assistance Modified independent, requires aide device or extra time  Assistive Device Front wheel walker  Distance Ambulated (ft) 1500 ft  Activity Response Tolerated well  Mobility Referral Yes  $Mobility charge 1 Mobility   Pt received in recliner and agreeable to mobility. C/o ribs feeling sore throughout ambulation. Pt to recliner after session with all needs met.    [facility]

## 2022-11-23 NOTE — Consult Note (Signed)
NAME:  [name], MRN:  [id number], DOB:  Feb 01, 1931, LOS: 0 ADMISSION DATE:  11/21/2022, CONSULTATION DATE:  11/22/22 REFERRING MD:  Vernell Leep, MD, CHIEF COMPLAINT:  Right lower lobe lung mass   History of Present Illness:  89 year old female former smoker admitted for mechanical fall with resultant fractures in left 7th thru 9th ribs.  In the ED imaging demonstrated incidental findings of right lower lobe lung mass, increased in size compared to CT A/P in 01/30/21. Denies systemic symptoms. No fever, chills, unintentional weight loss, night sweats. History of breast cancer >10 years ago s/p lumpectomy, chemo and radiation. Discharged from Oncology after surveillance monitoring was negative.    She smoked 1/2 -3/4 ppd x 10 years in her 60s. Secondhand smoke exposure with her husband. No known family hx lung cancer.  Pertinent  Medical History  breast cancer, CKD stage 3a, HLD, HTN, hypothyroidism, history of fall with orbital fracture, SAH and thoracic compression fracture in August of 2021   Significant Hospital Events: Including procedures, antibiotic start and stop dates in addition to other pertinent events   CT CAP with RLL lung mass 4.9 x 4.1 cm compared to 3.9 x 3.4 in 01/2021. Subpleural solid nodular opacity in RUL 1.2 x 0.7 cm. Small solid nodule in lingular 5 mm. Partly solid nodule in left apex 1.1 x1 cm. Nondisplaced fractures in left 7-9th rib. Unchanged compression fractures in T11-L1  Interim History / Subjective:  Last patient states she wanted procedure. This morning patient wants to cancel procedure Daughters are working on coordinating care with [facility]  Objective   Blood pressure (!) 149/81, pulse 71, temperature 98.2 F (36.8 C), temperature source Oral, resp. rate 18, height 5\' 4"  (1.626 m), weight 49 kg, SpO2 100 %.        Intake/Output Summary (Last 24 hours) at 11/23/2022 0717 Last data filed at 11/23/2022 0651 Gross per 24 hour  Intake 900 ml  Output 0 ml   Net 900 ml   Filed Weights   11/21/22 1204 11/21/22 1211  Weight: 49 kg 49 kg   Physical Exam: General: Elderly, frail-appearing, no acute distress HENT: [place], AT, OP clear, MMM Eyes: EOMI, no scleral icterus Respiratory: Clear to auscultation bilaterally.  No crackles, wheezing or rales Cardiovascular: RRR, -M/R/G, no JVD Extremities:-Edema,-tenderness Neuro: AAO x4, CNII-XII grossly intact Psych: Normal mood, normal affect   2/9 Unremakarble CBC and CMET except for CKD stage IIIa COVID negative  CT imaging from 05/20/13 (no mass seen), 09/08/17, 01/30/21 and 11/21/22 from left to right.   Resolved Hospital Problem list   N/A  Assessment & Plan:   Right lower lobe lung mass with probable metastatic lesions Available imaging reviewed since 2014 with evidence of lung mass since 2018 slowly enlarging. Lung mass is highly concerning for malignancy. Low suspicion for infection/inflammatory etiology. We discussed diagnostic testing including bronchoscopy vs PET scan to evaluate for alternative biopsy site. CT guided biopsy considered but would have increased risk of pneumothorax compared to bronchoscopy. We discussed bronchoscopy risks and benefits of procedure including infection, bleeding and lung collapse. We also discussed utility in procedure and whether patient wished to pursue treatment if this was confirmed to be cancer. After addressing patient/family's questions including concerns regarding procedures and anesthesia, patient wishes to continue discussion with family.  11/23/22 - Patient wishes to hold off on procedure. Will cancel navigational bronchoscopy  Will arrange follow-up with me before the end of Feb to continue discussion regarding lung mass.  If  patient is able to arrange follow-up with [facility], ok to cancel my appointment  Pulmonary will sign off  Best Practice (right click and "Reselect all SmartList Selections" daily)   Diet/type: Regular consistency (see  orders) DVT prophylaxis: LMWH GI prophylaxis: N/A Lines: N/A Foley:  N/A Code Status:  full code Last date of multidisciplinary goals of care discussion []  Per primary  Critical care time:  N/A    Care Time: 35 min  Rodman Pickle, M.D. [facility] Pulmonary/Critical Care Medicine 11/23/2022 7:17 AM   See Amion for personal pager For hours between 7 PM to 7 AM, please call Elink for urgent questions

## 2022-11-24 DIAGNOSIS — S2242XD Multiple fractures of ribs, left side, subsequent encounter for fracture with routine healing: Secondary | ICD-10-CM | POA: Diagnosis not present

## 2022-11-24 LAB — GLUCOSE, CAPILLARY: Glucose-Capillary: 66 mg/dL — ABNORMAL LOW (ref 70–99)

## 2022-11-24 SURGERY — BRONCHOSCOPY, WITH BIOPSY USING ELECTROMAGNETIC NAVIGATION
Anesthesia: General | Laterality: Right

## 2022-11-24 MED ORDER — METHOCARBAMOL 500 MG PO TABS: 500.0000 mg | ORAL_TABLET | Freq: Three times a day (TID) | ORAL | 0 refills | Status: DC | PRN

## 2022-11-24 MED ORDER — ACETAMINOPHEN 500 MG PO TABS: 1000.0000 mg | ORAL_TABLET | Freq: Three times a day (TID) | ORAL | Status: AC | PRN

## 2022-11-24 MED ORDER — TRAMADOL HCL 50 MG PO TABS: 50.0000 mg | ORAL_TABLET | Freq: Three times a day (TID) | ORAL | 0 refills | Status: DC | PRN

## 2022-11-24 MED ORDER — POLYETHYLENE GLYCOL 3350 17 G PO PACK: 17.0000 g | PACK | Freq: Every day | ORAL | 0 refills | Status: DC | PRN

## 2022-11-24 MED ORDER — LIDOCAINE 5 % EX PTCH: 1.0000 | MEDICATED_PATCH | CUTANEOUS | 0 refills | Status: DC

## 2022-11-24 NOTE — Progress Notes (Signed)
Received call from pt's daughter [name] to discuss concerns about her mother being discharged. Daughter insists that the pt's PT and OT evaluations have "been falsified" and that we must "redo the evaluations in my presence so that I can verify that they are correct." Allowed daughter to vent frustrations, many questions answered and support offered. Suggested to daughter that she consider a [place] aide to assist pt post discharge but daughter states that she wants the insurance company to pay for this. Also suggested that neighbors, family, friends or folks from their place of worship assist might be able to assist w care post discharge. Daughter very angry, lashing out at times, screaming at times over the phone. MD, DD and [facility] notified of above. Pt currently up in recliner ordering her lunch tray

## 2022-11-24 NOTE — Patient Instructions (Incomplete)
It was good to see you today- I am sorry you fell and got hurt!

## 2022-11-24 NOTE — TOC Transition Note (Addendum)
Transition of Care [facility]) - CM/SW Discharge Note  Patient Details  Name: [name] MRN: [id number] Date of Birth: Nov 29, 1930  Transition of Care [facility]) CM/SW Contact:  Sherie Don, LCSW Phone Number: 11/24/2022, 10:06 AM  Clinical Narrative: [facility] consulted as patient and daughter, [name], want SNF even though patient does not meet criteria. CSW spoke with patient, who requested that [place] speak with daughter. CSW called daughter, Park Liter, and attempted to explain several times why the patient does not meet criteria for SNF, such as walking 1500 feet with the mobility specialist and is modified independent per OT's note. Daughter stated the patient "didn't get a proper occupational therapy evaluation" because that's what the patient reported to her. CSW explained that if family wants SNF, they can pursue private pay and facilities generally require 30 days up front, which is approximately $10,000 depending on the facility. CSW recommended that family consider an OPPT referral from patient's PCP. Daughter became increasingly argumentative and insisted the patient needed SNF as she cannot care for herself even though daughter acknowledged patient can ambulate without any issues. CSW updated treatment team. TOC signing off.  Addendum: 11:19am-Patient and daughter wanted to appeal the discharge, but patient is under observation and cannot appeal the discharge. CSW updated patient and daughter, [name], that patient cannot appeal the discharge. CSW recommended family follow up with [facility] for assistance with setting up services at home. Daughter reported patient needs help with tasks such as cooking dinner. CSW explained that private duty care would assist with those types of needs and are not covered by Medicare.  Final next level of care: Home/Self Care Barriers to Discharge: Barriers Resolved  Patient Goals and CMS Choice Choice offered to / list presented to : NA  Discharge Plan and  Services Additional resources added to the After Visit Summary for         DME Arranged: N/A DME Agency: NA  Social Determinants of Health (SDOH) Interventions SDOH Screenings   Food Insecurity: No Food Insecurity (01/16/2022)  Housing: Low Risk  (01/16/2022)  Transportation Needs: No Transportation Needs (01/16/2022)  Alcohol Screen: Low Risk  (01/16/2022)  Depression (PHQ2-9): Low Risk  (01/29/2022)  Financial Resource Strain: Low Risk  (01/16/2022)  Physical Activity: Inactive (01/10/2021)  Social Connections: Socially Integrated (01/16/2022)  Stress: No Stress Concern Present (01/16/2022)  Tobacco Use: Medium Risk (11/21/2022)   Readmission Risk Interventions     No data to display

## 2022-11-24 NOTE — Progress Notes (Signed)
Reviewed written d/c instructions w pt and her daughter [name]) and all questions answered. They both verbalized understanding. Gave daughter paper Rxes, d/c via w/c w all belongings in stable condition.

## 2022-11-24 NOTE — Discharge Summary (Signed)
Physician Discharge Summary  [name] EUM:[id number] DOB: [birth year]  PCP: Darreld Mclean, MD  Admitted from: Home Discharged to: Home versus SNF, family to determine pending TOC input.  Admit date: 11/21/2022 Discharge date: 11/24/2022  Recommendations for Outpatient Follow-up:    Follow-up Information     Heath Lark, MD. Schedule an appointment as soon as possible for a visit .   Specialty: Hematology and Oncology Contact information: [place] [address] [phone]         Darreld Mclean, MD. Schedule an appointment as soon as possible for a visit in 1 week(s).   Specialty: Family Medicine Why: To be seen with repeat labs (CBC & BMP). Contact information: [address] [place] [address] [phone]         [facility]. Schedule an appointment as soon as possible for a visit.   Specialty: Pulmonology Why: MDs office will call you with follow-up appoint meant.  Please call them back if do not hear from them in 3-5 business days. Contact information: [address]  [place] [address] [phone]                 Home Health: None recommended by PT and OT.    Equipment/Devices: None    Discharge Condition: Improved and stable.   Code Status: Full Code Diet recommendation:  Discharge Diet Orders (From admission, onward)     Start     Ordered   11/24/22 0000  Diet - low sodium heart healthy        11/24/22 0925             Discharge Diagnoses:  Principal Problem:   Rib fractures Active Problems:   Right lower lobe lung mass   Essential hypertension   Hypothyroidism   Hyperlipidemia   Stage 3a chronic kidney disease   Breast cancer ([place])   Brief Summary: 89 year old married female, independent, medical history significant for HTN, HLD, hypothyroid, CKD 3A, breast cancer, sustained a mechanical fall at home  on day prior to admission, fell down hitting the left side of her chest to the side of the tub, no head injury or LOC, presented to ED 11/21/2022 with complaints of left-sided chest pain.  Admitted for left multiple nondisplaced rib fractures with severe chest wall pain for pain control and mobilization.  Incidental right lower lobe mass on CT chest noted.  Pulmonology consulted.  Patient was initially planning to undergo bronchoscopy and biopsy while in the hospital but has finally changed her mind and now plans to follow-up regarding this as outpatient.  PT and OT have seen and have no outpatient recommendations.  Patient is medically stable for DC.  TOC has been consulted to meet with patient and family to determine if they would like to return home versus SNF for STR.     Assessment & Plan:    Left-sided chest wall pain due to multiple left (7-9) nondisplaced rib fractures from mechanical fall: CT C/A/P with contrast 2/9: Nondisplaced fractures of the anterolateral left seventh-ninth ribs, without pneumothorax.  No other acute traumatic findings in the abdomen or pelvis.  Multimodality pain control: Scheduled Tylenol, Robaxin, lidocaine patch, as needed tramadol.  Aggressive incentive spirometry as tolerated.  Mobilize as able.  Ambulated 400 feet with PT 2/10.  PT and OT recommend no outpatient follow-up.  Ambulated 1500 feet with mobility specialist on 2/11.  As per discussion  with nursing, patient seen ambulating in the halls without significant pain or discomfort.  Apart from scheduled Tylenol, Robaxin and lidocaine patch, has required infrequent doses of tramadol. Patient is medically stable for DC.  TOC has been consulted to meet with patient and family to determine if they would like to return home versus SNF for STR.   Large irregular mass of the right lower lobe/part solid and small solid pulmonary nodules: Incidentally seen on CT C/A/P on 2/9.  Increased in size when compared to prior CT and  concerning for primary lung malignancy.  Discussed findings with patient (was unaware of this until this hospital admission).  Likely for close outpatient follow-up with PCP/pulmonology to determine need for further workup if she is a candidate for treatment if it turns out to be malignant.?  Initial PET scan as outpatient.  CT chest also shows pulmonary nodules, subpleural reticular opacities and traction bronchiectasis,?  ILD, all of this can also be followed up by outpatient pulmonology.   Pulmonology consulted, input appreciated.  They were concerned that the lesion is probably a metastatic lesion.  They discussed with patient/family regarding bronchoscopy versus PET scan.  Patient and family had initially decided on proceeding with inpatient bronchoscopy and biopsy but subsequently they changed their mind and plan to follow-up regarding this as outpatient.  They were given options of either following up at [facility] (where daughter works) or with [facility] pulmonology.   Essential hypertension: Continue home dose of amlodipine 2.5 Mg daily.     Hypothyroidism: TSH 4.4 in 07/2022.  Continue Synthroid 75 mcg daily.   Hyperlipidemia: Continue rosuvastatin   Stage IIIa chronic kidney disease: Creatinine normal and at baseline.   Breast cancer: S/p lumpectomy in 2010 with radiation.  Anastrozole x 5 years.  Reported to be in remission.   Body mass index is 18.54 kg/m.     Consultants:   Pulmonology   Procedures:   None   Discharge Instructions  Discharge Instructions     Call MD for:  difficulty breathing, headache or visual disturbances   Complete by: As directed    Call MD for:  extreme fatigue   Complete by: As directed    Call MD for:  persistant dizziness or light-headedness   Complete by: As directed    Call MD for:  persistant nausea and vomiting   Complete by: As directed    Call MD for:  severe uncontrolled pain   Complete by: As directed    Diet - low sodium heart  healthy   Complete by: As directed    Discharge instructions   Complete by: As directed    For the initial 3 to 5 days, may take the Tylenol and Robaxin 3 times a day scheduled and then can change to 3 times a day as needed for pain and muscle spasm respectively.   Increase activity slowly   Complete by: As directed         Medication List     TAKE these medications    acetaminophen 500 MG tablet Commonly known as: TYLENOL Take 2 tablets (1,000 mg total) by mouth 3 (three) times daily as needed for mild pain.   amLODipine 2.5 MG tablet Commonly known as: NORVASC Take 1 tablet (2.5 mg total) by mouth daily.   CALCIUM + D PO Take 1 capsule by mouth daily.   latanoprost 0.005 % ophthalmic solution Commonly known as: XALATAN Place 1 drop into both eyes at bedtime.   levothyroxine 75 MCG tablet  Commonly known as: SYNTHROID TAKE 1 TABLET BY MOUTH DAILY BEFORE BREAKFAST   lidocaine 5 % Commonly known as: LIDODERM Place 1 patch onto the skin daily. Remove & Discard patch within 12 hours or as directed by MD. Apply to left side chest wall at site of pain   methocarbamol 500 MG tablet Commonly known as: ROBAXIN Take 1 tablet (500 mg total) by mouth 3 (three) times daily as needed for muscle spasms.   multivitamin with minerals Tabs tablet Take 1 tablet by mouth daily. One a day   polyethylene glycol 17 g packet Commonly known as: MIRALAX / GLYCOLAX Take 17 g by mouth daily as needed for mild constipation or moderate constipation.   rosuvastatin 10 MG tablet Commonly known as: Crestor Take 1 tablet (10 mg total) by mouth daily. TAKE 1 BY MOUTH DAILY   traMADol 50 MG tablet Commonly known as: ULTRAM Take 1 tablet (50 mg total) by mouth every 8 (eight) hours as needed for moderate pain or severe pain.       Allergies  Allergen Reactions   Daypro [Oxaprozin] Swelling    Face and tongue   Prolia [Denosumab]     unknown      Procedures/Studies: CT CHEST ABDOMEN  PELVIS W CONTRAST  Result Date: 11/21/2022 CLINICAL DATA:  Fall yesterday afternoon; * Tracking Code: BO * EXAM: CT CHEST, ABDOMEN, AND PELVIS WITH CONTRAST TECHNIQUE: Multidetector CT imaging of the chest, abdomen and pelvis was performed following the standard protocol during bolus administration of intravenous contrast. RADIATION DOSE REDUCTION: This exam was performed according to the departmental dose-optimization program which includes automated exposure control, adjustment of the mA and/or kV according to patient size and/or use of iterative reconstruction technique. CONTRAST:  18mL OMNIPAQUE IOHEXOL 300 MG/ML  SOLN COMPARISON:  CT abdomen and pelvis dated February 07, 2021 FINDINGS: CT CHEST FINDINGS Cardiovascular: Normal heart size. Small pericardial effusion. Normal caliber thoracic aorta with moderate atherosclerotic disease. Mediastinum/Nodes: Small hiatal hernia and patulous esophagus. Thyroid is unremarkable. No pathologically enlarged lymph nodes seen in the chest. Lungs/Pleura: Central airways are patent. Mild subpleural reticular opacities with associated traction bronchiolectasis. Large irregular mass of the right lower lobe with air bronchograms and adjacent pleural thickening measuring 4.9 x 4.1 cm on series 4 image 95, finding was partially visualized on prior CT and is increased in size, visualized portion measured 3.9 x 3.4 cm. Small subpleural solid nodular opacity of the anterior right upper lobe measuring 1.2 x 0.7 cm on series 4, image 49. Small solid nodule of the lingula measuring 5 mm on series 4, image 71. Part solid nodule of the left lung apex measuring 2.1 x 1.6 cm in overall diameter with approximate 1.1 x 0.9 cm solid component. Trace bilateral pleural effusions. No evidence of pneumothorax. Musculoskeletal: Nondisplaced fractures of the anterolateral left 7th-9th ribs. Severe compression deformities of T11, T12 and L1 with vertebroplasty changes at T12 and L1, unchanged when  compared with prior exam. CT ABDOMEN PELVIS FINDINGS Hepatobiliary: No hepatic injury or perihepatic hematoma. Unchanged low-attenuation lesions of the right lobe of the liver which are likely simple cysts. Gallbladder is unremarkable. Pancreas: Unremarkable. No pancreatic ductal dilatation or surrounding inflammatory changes. Spleen: Normal in size without focal abnormality. Adrenals/Urinary Tract: Bilateral adrenal glands are unremarkable. No hydronephrosis or nephrolithiasis. Bilateral low-attenuation renal lesions which are too small to accurately characterize but likely simple cysts, no specific follow-up imaging is recommended. Bladder is unremarkable. Stomach/Bowel: Stomach is within normal limits. Severe diverticulosis. No evidence of  bowel wall thickening, distention, or inflammatory changes. Vascular/Lymphatic: Aortic atherosclerosis. No enlarged abdominal or pelvic lymph nodes. Reproductive: Status post hysterectomy. No adnexal masses. Other: No abdominal wall hernia or abnormality. Trace free fluid seen in the right upper quadrant on series 2, image 65. Musculoskeletal: No acute or significant osseous findings. IMPRESSION: 1. Nondisplaced fractures of the anterolateral left 7th-9th ribs. No evidence of pneumothorax. 2. No acute traumatic findings in the abdomen or pelvis. Trace free fluid is seen in the right upper quadrant of the abdomen which is nonspecific given no evidence of solid organ injury. 3. Large irregular mass of the right lower lobe, increased in size when compared with prior CT and concerning for primary lung malignancy. Additional imaging evaluation or consultation with Pulmonology or Thoracic Surgery recommended. 4. Additional part solid and small solid pulmonary nodules are seen in the chest. Recommend attention on follow-up. 5. Small pericardial effusion and trace bilateral pleural effusions. 6. Subpleural reticular opacities and traction bronchiectasis, findings can be seen in setting  of interstitial lung disease. 7. Aortic Atherosclerosis (ICD10-I70.0). Electronically Signed   By: Yetta Glassman M.D.   On: 11/21/2022 15:47   DG Ribs Unilateral W/Chest Left  Result Date: 11/21/2022 CLINICAL DATA:  Mechanical fall yesterday afternoon, slipped on rug, struck LEFT ribs, woke up today with increased pain with movement and deep breathing EXAM: LEFT RIBS AND CHEST - 3+ VIEW COMPARISON:  09/20/2021 FINDINGS: Enlargement of cardiac silhouette. Mediastinal contours and pulmonary vascularity normal. Minimal persistent LEFT basilar atelectasis. Increased opacity at lateral RIGHT lung base since 2022, concerning for mass. Remaining lungs clear. No pleural effusion or pneumothorax. Bones demineralized. BB placed at site of symptoms lower LEFT ribs. Minimally displaced fracture of the anterolateral LEFT ninth rib. Questionable nondisplaced fracture LEFT seventh rib. IMPRESSION: Fractures of LEFT ninth and questionably LEFT seventh ribs. Increased opacity at lateral RIGHT lung base since 2022, concerning for tumor; CT chest recommended for further evaluation, with contrast if patient's renal function permits. Findings called to Dr.  Alvino Chapel on 11/21/2022 at 1246 hours. Electronically Signed   By: Lavonia Dana M.D.   On: 11/21/2022 12:47      Subjective: Patient states that she had a hot shower and washed her hair with assistance from nursing at around 4 AM and feels much better, "feels great".  Ongoing left-sided rib cage/chest wall pain, 7/10 prior to meds and much improved control after meds.  Discharge Exam:  Vitals:   11/23/22 1151 11/23/22 1349 11/23/22 2119 11/24/22 0512  BP: 130/68 129/72 131/72 (!) 150/77  Pulse: 75 79 67 61  Resp: 16 16 16 16   Temp: (!) 97.3 F (36.3 C) 97.9 F (36.6 C) (!) 97.5 F (36.4 C) (!) 97.4 F (36.3 C)  TempSrc: Oral Oral Oral Oral  SpO2: 100% 98% 98% 100%  Weight:      Height:        General exam: Elderly female, small built, frail, sitting up  comfortably upright in reclining chair, smiling, appears to be in good spirits, has her home nightgown on. Respiratory system: Clear to auscultation.  No increased work of breathing.  Left lower lateral chest wall tenderness with no external bruising (as examined on 2/10).   Cardiovascular system: S1 and S2 heard, RRR.  No JVD, murmurs or pedal edema. Gastrointestinal system: Abdomen is nondistended, soft and nontender. No organomegaly or masses felt. Normal bowel sounds heard. Central nervous system: Alert and oriented. No focal neurological deficits. Extremities: Symmetric 5 x 5 power. Skin: No rashes,  lesions or ulcers Psychiatry: Judgement and insight appear normal. Mood & affect appropriate.     The results of significant diagnostics from this hospitalization (including imaging, microbiology, ancillary and laboratory) are listed below for reference.     Microbiology: Recent Results (from the past 240 hour(s))  SARS Coronavirus 2 by RT PCR (hospital order, performed in [facility] hospital lab) *cepheid single result test* Anterior Nasal Swab     Status: None   Collection Time: 11/22/22  6:53 PM   Specimen: Anterior Nasal Swab  Result Value Ref Range Status   SARS Coronavirus 2 by RT PCR NEGATIVE NEGATIVE Final    Comment: (NOTE) SARS-CoV-2 target nucleic acids are NOT DETECTED.  The SARS-CoV-2 RNA is generally detectable in upper and lower respiratory specimens during the acute phase of infection. The lowest concentration of SARS-CoV-2 viral copies this assay can detect is 250 copies / mL. A negative result does not preclude SARS-CoV-2 infection and should not be used as the sole basis for treatment or other patient management decisions.  A negative result may occur with improper specimen collection / handling, submission of specimen other than nasopharyngeal swab, presence of viral mutation(s) within the areas targeted by this assay, and inadequate number of viral copies (<250  copies / mL). A negative result must be combined with clinical observations, patient history, and epidemiological information.  Fact Sheet for Patients:   https://www.patel.info/  Fact Sheet for Healthcare Providers: https://hall.com/  This test is not yet approved or  cleared by the [place] FDA and has been authorized for detection and/or diagnosis of SARS-CoV-2 by FDA under an Emergency Use Authorization (EUA).  This EUA will remain in effect (meaning this test can be used) for the duration of the COVID-19 declaration under Section 564(b)(1) of the Act, 21 U.S.C. section 360bbb-3(b)(1), unless the authorization is terminated or revoked sooner.  Performed at [facility], [place] [address], [place], [place] [address]      Labs: CBC: Recent Labs  Lab 11/21/22 1333  WBC 9.1  HGB 13.2  HCT 40.5  MCV 93.5  PLT 248    Basic Metabolic Panel: Recent Labs  Lab 11/21/22 1333  NA 137  K 3.9  CL 105  CO2 25  GLUCOSE 83  BUN 16  CREATININE 0.94  CALCIUM 9.6    Liver Function Tests: Recent Labs  Lab 11/21/22 1333  AST 35  ALT 24  ALKPHOS 107  BILITOT 0.7  PROT 7.2  ALBUMIN 3.7    CBG: Recent Labs  Lab 11/24/22 0519  GLUCAP 66*   Patient's care has been discussed in great detail with her daughter on a daily basis until yesterday when she was advised that patient is medically stable for DC to next level of care.  Time coordinating discharge: 25 minutes  SIGNED:  Vernell Leep, MD,  FACP, [facility], [facility], [facility], [facility]   [facility] [place]     To contact the attending provider between 7A-7P or the covering provider during after hours 7P-7A, please log into the web site [email or web address] and access using universal [place] password for that web site. If you do not have the password, please call the hospital operator.

## 2022-11-24 NOTE — Discharge Instructions (Signed)

## 2022-11-24 NOTE — Progress Notes (Deleted)
[place] [facility] [address], [place], [place] [address] [phone]  Date:  11/26/2022   Name:  [name]   DOB:  1931/07/13   MRN:  [id number]  PCP:  Darreld Mclean, MD    Chief Complaint: No chief complaint on file.   History of Present Illness:  [name] is a 89 y.o. very pleasant female patient who presents with the following:  Pt seen today for follow-up Last seen by myself in October   History of hypertension, hyperlipidemia, hypothyroidism, breast cancer, more recently some difficulty with paranoia and anxiety Also, in August 2021 she had a fall and sustained an orbital fracture, subarachnoid hemorrhage, and thoracic compression fracture.  She had a kyphoplasty in February of 2022.  She notes good results from her back surgery- she does wear a back brace which helps her    She enjoys walking for exercise- she is walking about an hour a day Lives with her husband [name] -they are in a 1 level home  Se was recently admitted 2/9- 2/12: she fell and suffered rib fractures, possible lung cancer noted on CT abd pelvis IMPRESSION: 1. Nondisplaced fractures of the anterolateral left 7th-9th ribs. No evidence of pneumothorax. 2. No acute traumatic findings in the abdomen or pelvis. Trace free fluid is seen in the right upper quadrant of the abdomen which is nonspecific given no evidence of solid organ injury. 3. Large irregular mass of the right lower lobe, increased in size when compared with prior CT and concerning for primary lung malignancy. Additional imaging evaluation or consultation with Pulmonology or Thoracic Surgery recommended. 4. Additional part solid and small solid pulmonary nodules are seen in the chest. Recommend attention on follow-up. 5. Small pericardial effusion and trace bilateral pleural effusions. 6. Subpleural reticular opacities and traction bronchiectasis, findings can be seen in  setting of interstitial lung disease. 7. Aortic Atherosclerosis (ICD10-I70.0). Discharge Diagnoses:  Principal Problem:   Rib fractures Active Problems:   Right lower lobe lung mass   Essential hypertension   Hypothyroidism   Hyperlipidemia   Stage 3a chronic kidney disease   Breast cancer (HCC) Left-sided chest wall pain due to multiple left (7-9) nondisplaced rib fractures from mechanical fall: CT C/A/P with contrast 2/9: Nondisplaced fractures of the anterolateral left seventh-ninth ribs, without pneumothorax.  No other acute traumatic findings in the abdomen or pelvis.  Multimodality pain control: Scheduled Tylenol, Robaxin, lidocaine patch, as needed tramadol.  Aggressive incentive spirometry as tolerated.  Mobilize as able.  Ambulated 400 feet with PT 2/10.  PT and OT recommend no outpatient follow-up.  Ambulated 1500 feet with mobility specialist on 2/11.  As per discussion with nursing, patient seen ambulating in the halls without significant pain or discomfort.  Apart from scheduled Tylenol, Robaxin and lidocaine patch, has required infrequent doses of tramadol. Patient is medically stable for DC.  TOC has been consulted to meet with patient and family to determine if they would like to return home versus SNF for STR. Large irregular mass of the right lower lobe/part solid and small solid pulmonary nodules: Incidentally seen on CT C/A/P on 2/9.  Increased in size when compared to prior CT and concerning for primary lung malignancy.  Discussed findings with patient (was unaware of this until this hospital admission).  Likely for close outpatient follow-up with PCP/pulmonology to determine need for further workup if she is a candidate for treatment if it turns out  to be malignant.?  Initial PET scan as outpatient.  CT chest also shows pulmonary nodules, subpleural reticular opacities and traction bronchiectasis,?  ILD, all of this can also be followed up by outpatient pulmonology. Pulmonology  consulted, input appreciated.  They were concerned that the lesion is probably a metastatic lesion.  They discussed with patient/family regarding bronchoscopy versus PET scan.  Patient and family had initially decided on proceeding with inpatient bronchoscopy and biopsy but subsequently they changed their mind and plan to follow-up regarding this as outpatient.  They were given options of either following up at [facility] (where daughter works) or with [facility] pulmonology. Essential hypertension: Continue home dose of amlodipine 2.5 Mg daily.   Hypothyroidism: TSH 4.4 in 07/2022.  Continue Synthroid 75 mcg daily. Hyperlipidemia: Continue rosuvastatin Stage IIIa chronic kidney disease: Creatinine normal and at baseline. Breast cancer: S/p lumpectomy in 2010 with radiation.  Anastrozole x 5 years.  Reported to be in remission.   Pulmonology saw pt in the hospital, Dr Loanne Drilling Right lower lobe lung mass with probable metastatic lesions Available imaging reviewed since 2014 with evidence of lung mass since 2018 slowly enlarging. Lung mass is highly concerning for malignancy. Low suspicion for infection/inflammatory etiology. We discussed diagnostic testing including bronchoscopy vs PET scan to evaluate for alternative biopsy site. CT guided biopsy considered but would have increased risk of pneumothorax compared to bronchoscopy. We discussed bronchoscopy risks and benefits of procedure including infection, bleeding and lung collapse. We also discussed utility in procedure and whether patient wished to pursue treatment if this was confirmed to be cancer. After addressing patient/family's questions including concerns regarding procedures and anesthesia, patient wishes to continue discussion with family. 11/23/22 - Patient wishes to hold off on procedure. Will cancel navigational bronchoscopy Will arrange follow-up with me before the end of Feb to continue discussion regarding lung mass.  If patient is able to  arrange follow-up with [facility], ok to cancel my appointment Pulmonary will sign off  Patient Active Problem List   Diagnosis Date Noted   Rib fractures 11/21/2022   Right lower lobe lung mass 11/21/2022   Autoimmune disease ([place]) 07/31/2021   Barrett's esophagus 07/31/2021   Bone lesion 07/31/2021   Cervical cancer ([place]) 07/31/2021   Dysphagia 07/31/2021   Hypothyroidism 07/31/2021   Chronic midline thoracic back pain 12/31/2020   Paroxysmal SVT (supraventricular tachycardia) 12/23/2020   Intracranial hemorrhage (HCC)    TBI (traumatic brain injury) ([place]) 05/04/2020   Back pain 05/04/2020   SAH (subarachnoid hemorrhage) ([place]) 04/29/2020   Orbital fracture, closed, initial encounter ([place]) 04/29/2020   Basal cell carcinoma (BCC) in situ of skin 04/04/2020   Essential hypertension 03/21/2019   Stage 3a chronic kidney disease    Systolic murmur 08/67/6195   Hypercalcemia    Dizziness 02/22/2019   Squamous cell carcinoma in situ of skin 11/30/2018   PVC (premature ventricular contraction) 09/08/2017   Glaucoma 09/08/2017   Hx of meningioma of the brain 08/28/2017   Hoarseness of voice 02/28/2016   Raynaud's phenomenon 02/28/2016   Esophageal spasm 09/20/2013   Osteoporosis 03/18/2012   Humerus fracture 03/2012   Fracture, shoulder 03/06/2012   Hyperlipidemia    Breast cancer [facility])     Past Medical History:  Diagnosis Date   Acute blood loss anemia    AKI (acute kidney injury) ([place])    Autoimmune disease ([place])    Back pain 05/04/2020   Barrett's esophagus    Basal cell carcinoma (BCC) in situ of skin 04/04/2020  Path 03/2020, [facility]    Bone lesion    sacrum   Breast cancer ([place]) 2010   cT1 N0 M0; ER+/ PR+/ HER2 neg; s/p lumpectomy 07/2010; started adjuvant hormonal therapy 10/2009   Cellulitis of skin 12/31/2020   Cervical cancer ([place])    Chronic midline thoracic back pain 12/31/2020   Diarrhea 09/08/2017   Dizziness 02/22/2019   Dysphagia    Elevated serum  creatinine 09/08/2017   Esophageal spasm 09/20/2013   Essential hypertension 03/21/2019   Fall at home, initial encounter 04/29/2020   Fracture, shoulder 03/06/2012   Pt seen at [facility] by Dr. Iran Planas on 03/09/2012 and 03/11/2012.   X-rays taken of left wrist, left elbow, and left shoulder.   Shoulder Fx: nondisplaced proximal humerus fracture involving the greater tuberosity. Wrist: consistent with an old distal radius fracture with shortening present.       Glaucoma    Hoarseness of voice 02/28/2016   Humerus fracture 03/2012   impacted left humueral neck fracture, treated by Dr. Amedeo Plenty   Hx of meningioma of the brain 08/28/2017   Stable since 2010- 2018 on MRI   Hypercalcemia    Hyperlipidemia    Hypertension    Hypothyroidism    Intracranial hemorrhage (HCC)    Nausea vomiting and diarrhea 09/08/2017   Orbital fracture, closed, initial encounter ([place]) 04/29/2020   Osteoporosis 03/18/2012   Paroxysmal SVT (supraventricular tachycardia) 12/23/2020   Pneumonia    PVC (premature ventricular contraction) 09/08/2017   Raynaud's phenomenon 02/28/2016   SAH (subarachnoid hemorrhage) ([place]) 04/29/2020   Squamous cell carcinoma in situ of skin 11/30/2018   Path 11/1008, Dermatology managing    Stage 3a chronic kidney disease    Systolic murmur 05/30/2992   TBI (traumatic brain injury) ([place]) 05/04/2020    Past Surgical History:  Procedure Laterality Date   ABDOMINAL HYSTERECTOMY  1972   cancer          1 tube 1 ovary   APPENDECTOMY     BACK SURGERY  11/28/2020   BREAST LUMPECTOMY Left 2010   colon polyp removal     EYE SURGERY     HAND SURGERY     KYPHOPLASTY N/A 11/28/2020   Procedure: KYPHOPLASTY THORACIC TWELVE AND LUMBAR ONE;  Surgeon: Melina Schools, MD;  Location: [facility];  Service: Orthopedics;  Laterality: N/A;  90 mins   ortho procedures     multiple   OVARIAN CYST REMOVAL  1978   SHOULDER SURGERY  1954   left    Social History   Tobacco Use   Smoking  status: Former    Types: Cigarettes    Quit date: 12/16/1986    Years since quitting: 35.9   Smokeless tobacco: Never   Tobacco comments:    FORMER SMOKER 30 YEARS AGO  Vaping Use   Vaping Use: Never used  Substance Use Topics   Alcohol use: Yes    Alcohol/week: 0.0 standard drinks of alcohol    Comment: OCCASIONALLY   Drug use: No    Family History  Problem Relation Age of Onset   Heart disease Father    Cancer Sister        lung, kidney   Cancer Brother        brain   Cancer Paternal Aunt        breast   Breast cancer Paternal Aunt    Cancer Brother        liver, lung   Osteoporosis Neg Hx  Allergies  Allergen Reactions   Daypro [Oxaprozin] Swelling    Face and tongue   Prolia [Denosumab]     unknown    Medication list has been reviewed and updated.  Current Outpatient Medications on File Prior to Visit  Medication Sig Dispense Refill   acetaminophen (TYLENOL) 500 MG tablet Take 2 tablets (1,000 mg total) by mouth 3 (three) times daily as needed for mild pain.     amLODipine (NORVASC) 2.5 MG tablet Take 1 tablet (2.5 mg total) by mouth daily. 90 tablet 3   Calcium Carbonate-Vitamin D (CALCIUM + D PO) Take 1 capsule by mouth daily.      latanoprost (XALATAN) 0.005 % ophthalmic solution Place 1 drop into both eyes at bedtime.      levothyroxine (SYNTHROID) 75 MCG tablet TAKE 1 TABLET BY MOUTH DAILY BEFORE BREAKFAST 90 tablet 3   lidocaine (LIDODERM) 5 % Place 1 patch onto the skin daily. Remove & Discard patch within 12 hours or as directed by MD. Apply to left side chest wall at site of pain 10 patch 0   methocarbamol (ROBAXIN) 500 MG tablet Take 1 tablet (500 mg total) by mouth 3 (three) times daily as needed for muscle spasms. 15 tablet 0   Multiple Vitamin (MULTIVITAMIN WITH MINERALS) TABS tablet Take 1 tablet by mouth daily. One a day     polyethylene glycol (MIRALAX / GLYCOLAX) 17 g packet Take 17 g by mouth daily as needed for mild constipation or moderate  constipation. 14 each 0   rosuvastatin (CRESTOR) 10 MG tablet Take 1 tablet (10 mg total) by mouth daily. TAKE 1 BY MOUTH DAILY 90 tablet 3   traMADol (ULTRAM) 50 MG tablet Take 1 tablet (50 mg total) by mouth every 8 (eight) hours as needed for moderate pain or severe pain. 10 tablet 0   No current facility-administered medications on file prior to visit.    Review of Systems:  As per HPI- otherwise negative.   Physical Examination: There were no vitals filed for this visit. There were no vitals filed for this visit. There is no height or weight on file to calculate BMI. Ideal Body Weight:    GEN: no acute distress. HEENT: Atraumatic, Normocephalic.  Ears and Nose: No external deformity. CV: RRR, No M/G/R. No JVD. No thrill. No extra heart sounds. PULM: CTA B, no wheezes, crackles, rhonchi. No retractions. No resp. distress. No accessory muscle use. ABD: S, NT, ND, +BS. No rebound. No HSM. EXTR: No c/c/e PSYCH: Normally interactive. Conversant.    Assessment and Plan: ***  Signed Lamar Blinks, MD

## 2022-11-25 NOTE — Progress Notes (Unsigned)
[place] [facility] [address] [place], [place] [address] [phone]  Date:  12/03/2022   Name:  [name]   DOB:  1931-03-22   MRN:  [id number]  PCP:  Darreld Mclean, MD    Chief Complaint: No chief complaint on file.   History of Present Illness:  [name] is a 89 y.o. very pleasant female patient who presents with the following:  Patient is seen today for hospital follow-up.  She was recently admitted after a fall with rib fractures, unfortunately she was also found to have lung cancer on associated imaging studies I last saw her in October  History of hypertension, hyperlipidemia, hypothyroidism, breast cancer, more recently some difficulty with paranoia and anxiety   She was seen by pulmonology, Dr. Loanne Drilling in the hospital.  However, she may wish to follow-up with [facility] instead: Dr. Lorelei Pont,  Patient is scheduled for hospital follow-up with you. Pulmonary was consulted for right lower lobe mass concerning for malignancy. I have offered a clinic follow-up with me however patient was going to try to obtain an oncology follow-up at [facility] (daughter works there). Please let me know if patient is unable to get an appointment before the end of the month or if patient decides she wishes to pursue bronchoscopy and would like to arrange this sooner rather than later. Dr. Loanne Drilling  Admit date: 11/21/2022 Discharge date: 11/24/2022 Discharge Diagnoses:  Principal Problem:   Rib fractures Active Problems:   Right lower lobe lung mass   Essential hypertension   Hypothyroidism   Hyperlipidemia   Stage 3a chronic kidney disease   Breast cancer [facility])   Brief Summary: 38 year old married female, independent, medical history significant for HTN, HLD, hypothyroid, CKD 3A, breast cancer, sustained a mechanical fall at home on day prior to admission, fell down hitting the left side of her chest to the side of the tub, no head  injury or LOC, presented to ED 11/21/2022 with complaints of left-sided chest pain.  Admitted for left multiple nondisplaced rib fractures with severe chest wall pain for pain control and mobilization.  Incidental right lower lobe mass on CT chest noted.  Pulmonology consulted.  Patient was initially planning to undergo bronchoscopy and biopsy while in the hospital but has finally changed her mind and now plans to follow-up regarding this as outpatient.  PT and OT have seen and have no outpatient recommendations.  Patient is medically stable for DC.  TOC has been consulted to meet with patient and family to determine if they would like to return home versus SNF for STR.    Patient Active Problem List   Diagnosis Date Noted   Rib fractures 11/21/2022   Right lower lobe lung mass 11/21/2022   Autoimmune disease ([place]) 07/31/2021   Barrett's esophagus 07/31/2021   Bone lesion 07/31/2021   Cervical cancer ([place]) 07/31/2021   Dysphagia 07/31/2021   Hypothyroidism 07/31/2021   Chronic midline thoracic back pain 12/31/2020   Paroxysmal SVT (supraventricular tachycardia) 12/23/2020   Intracranial hemorrhage (HCC)    TBI (traumatic brain injury) ([place]) 05/04/2020   Back pain 05/04/2020   SAH (subarachnoid hemorrhage) ([place]) 04/29/2020   Orbital fracture, closed, initial encounter ([place]) 04/29/2020   Basal cell carcinoma (BCC) in situ of skin 04/04/2020   Essential hypertension 03/21/2019   Stage 3a chronic kidney disease    Systolic murmur 82/50/5397   Hypercalcemia    Dizziness 02/22/2019   Squamous cell  carcinoma in situ of skin 11/30/2018   PVC (premature ventricular contraction) 09/08/2017   Glaucoma 09/08/2017   Hx of meningioma of the brain 08/28/2017   Hoarseness of voice 02/28/2016   Raynaud's phenomenon 02/28/2016   Esophageal spasm 09/20/2013   Osteoporosis 03/18/2012   Humerus fracture 03/2012   Fracture, shoulder 03/06/2012   Hyperlipidemia    Breast cancer [facility])     Past Medical  History:  Diagnosis Date   Acute blood loss anemia    AKI (acute kidney injury) ([place])    Autoimmune disease ([place])    Back pain 05/04/2020   Barrett's esophagus    Basal cell carcinoma (BCC) in situ of skin 04/04/2020   Path 03/2020, [facility]    Bone lesion    sacrum   Breast cancer ([place]) 2010   cT1 N0 M0; ER+/ PR+/ HER2 neg; s/p lumpectomy 07/2010; started adjuvant hormonal therapy 10/2009   Cellulitis of skin 12/31/2020   Cervical cancer ([place])    Chronic midline thoracic back pain 12/31/2020   Diarrhea 09/08/2017   Dizziness 02/22/2019   Dysphagia    Elevated serum creatinine 09/08/2017   Esophageal spasm 09/20/2013   Essential hypertension 03/21/2019   Fall at home, initial encounter 04/29/2020   Fracture, shoulder 03/06/2012   Pt seen at [facility] by Dr. Iran Planas on 03/09/2012 and 03/11/2012.   X-rays taken of left wrist, left elbow, and left shoulder.   Shoulder Fx: nondisplaced proximal humerus fracture involving the greater tuberosity. Wrist: consistent with an old distal radius fracture with shortening present.       Glaucoma    Hoarseness of voice 02/28/2016   Humerus fracture 03/2012   impacted left humueral neck fracture, treated by Dr. Amedeo Plenty   Hx of meningioma of the brain 08/28/2017   Stable since 2010- 2018 on MRI   Hypercalcemia    Hyperlipidemia    Hypertension    Hypothyroidism    Intracranial hemorrhage (HCC)    Nausea vomiting and diarrhea 09/08/2017   Orbital fracture, closed, initial encounter ([place]) 04/29/2020   Osteoporosis 03/18/2012   Paroxysmal SVT (supraventricular tachycardia) 12/23/2020   Pneumonia    PVC (premature ventricular contraction) 09/08/2017   Raynaud's phenomenon 02/28/2016   SAH (subarachnoid hemorrhage) ([place]) 04/29/2020   Squamous cell carcinoma in situ of skin 11/30/2018   Path 11/1008, Dermatology managing    Stage 3a chronic kidney disease    Systolic murmur 1/60/1093   TBI (traumatic brain injury) ([place]) 05/04/2020     Past Surgical History:  Procedure Laterality Date   ABDOMINAL HYSTERECTOMY  1972   cancer          1 tube 1 ovary   APPENDECTOMY     BACK SURGERY  11/28/2020   BREAST LUMPECTOMY Left 2010   colon polyp removal     EYE SURGERY     HAND SURGERY     KYPHOPLASTY N/A 11/28/2020   Procedure: KYPHOPLASTY THORACIC TWELVE AND LUMBAR ONE;  Surgeon: Melina Schools, MD;  Location: [facility];  Service: Orthopedics;  Laterality: N/A;  90 mins   ortho procedures     multiple   OVARIAN CYST REMOVAL  1978   SHOULDER SURGERY  1954   left    Social History   Tobacco Use   Smoking status: Former    Types: Cigarettes    Quit date: 12/16/1986    Years since quitting: 35.9   Smokeless tobacco: Never   Tobacco comments:    FORMER SMOKER 30 YEARS AGO  Vaping Use   Vaping Use: Never used  Substance Use Topics   Alcohol use: Yes    Alcohol/week: 0.0 standard drinks of alcohol    Comment: OCCASIONALLY   Drug use: No    Family History  Problem Relation Age of Onset   Heart disease Father    Cancer Sister        lung, kidney   Cancer Brother        brain   Cancer Paternal Aunt        breast   Breast cancer Paternal Aunt    Cancer Brother        liver, lung   Osteoporosis Neg Hx     Allergies  Allergen Reactions   Daypro [Oxaprozin] Swelling    Face and tongue   Prolia [Denosumab]     unknown    Medication list has been reviewed and updated.  Current Outpatient Medications on File Prior to Visit  Medication Sig Dispense Refill   acetaminophen (TYLENOL) 500 MG tablet Take 2 tablets (1,000 mg total) by mouth 3 (three) times daily as needed for mild pain.     amLODipine (NORVASC) 2.5 MG tablet Take 1 tablet (2.5 mg total) by mouth daily. 90 tablet 3   Calcium Carbonate-Vitamin D (CALCIUM + D PO) Take 1 capsule by mouth daily.      latanoprost (XALATAN) 0.005 % ophthalmic solution Place 1 drop into both eyes at bedtime.      levothyroxine (SYNTHROID) 75 MCG tablet TAKE 1 TABLET BY  MOUTH DAILY BEFORE BREAKFAST 90 tablet 3   lidocaine (LIDODERM) 5 % Place 1 patch onto the skin daily. Remove & Discard patch within 12 hours or as directed by MD. Apply to left side chest wall at site of pain 10 patch 0   methocarbamol (ROBAXIN) 500 MG tablet Take 1 tablet (500 mg total) by mouth 3 (three) times daily as needed for muscle spasms. 15 tablet 0   Multiple Vitamin (MULTIVITAMIN WITH MINERALS) TABS tablet Take 1 tablet by mouth daily. One a day     polyethylene glycol (MIRALAX / GLYCOLAX) 17 g packet Take 17 g by mouth daily as needed for mild constipation or moderate constipation. 14 each 0   rosuvastatin (CRESTOR) 10 MG tablet Take 1 tablet (10 mg total) by mouth daily. TAKE 1 BY MOUTH DAILY 90 tablet 3   traMADol (ULTRAM) 50 MG tablet Take 1 tablet (50 mg total) by mouth every 8 (eight) hours as needed for moderate pain or severe pain. 10 tablet 0   No current facility-administered medications on file prior to visit.    Review of Systems:  As per HPI- otherwise negative.   Physical Examination: There were no vitals filed for this visit. There were no vitals filed for this visit. There is no height or weight on file to calculate BMI. Ideal Body Weight:    GEN: no acute distress. HEENT: Atraumatic, Normocephalic.  Ears and Nose: No external deformity. CV: RRR, No M/G/R. No JVD. No thrill. No extra heart sounds. PULM: CTA B, no wheezes, crackles, rhonchi. No retractions. No resp. distress. No accessory muscle use. ABD: S, NT, ND, +BS. No rebound. No HSM. EXTR: No c/c/e PSYCH: Normally interactive. Conversant.    Assessment and Plan: ***  Signed Lamar Blinks, MD

## 2022-11-25 NOTE — Telephone Encounter (Signed)
Dr. Lorelei Pont,   Patient is scheduled for hospital follow-up with you. Pulmonary was consulted for right lower lobe mass concerning for malignancy. I have offered a clinic follow-up with me however patient was going to try to obtain an oncology follow-up at [place] (daughter works there). Please let me know if patient is unable to get an appointment before the end of the month or if patient decides she wishes to pursue bronchoscopy and would like to arrange this sooner rather than later.  Dr. Loanne Drilling

## 2022-11-26 ENCOUNTER — Inpatient Hospital Stay: Payer: Medicare Other | Admitting: Family Medicine

## 2022-11-26 DIAGNOSIS — R918 Other nonspecific abnormal finding of lung field: Secondary | ICD-10-CM | POA: Diagnosis not present

## 2022-11-28 DIAGNOSIS — R918 Other nonspecific abnormal finding of lung field: Secondary | ICD-10-CM | POA: Diagnosis not present

## 2022-11-28 DIAGNOSIS — C3490 Malignant neoplasm of unspecified part of unspecified bronchus or lung: Secondary | ICD-10-CM | POA: Diagnosis not present

## 2022-12-03 ENCOUNTER — Ambulatory Visit (INDEPENDENT_AMBULATORY_CARE_PROVIDER_SITE_OTHER): Payer: Medicare Other | Admitting: Family Medicine

## 2022-12-03 ENCOUNTER — Encounter: Payer: Self-pay | Admitting: Family Medicine

## 2022-12-03 VITALS — BP 162/85 | HR 59 | Temp 97.6°F | Resp 16 | Ht 64.0 in | Wt 108.4 lb

## 2022-12-03 DIAGNOSIS — I10 Essential (primary) hypertension: Secondary | ICD-10-CM

## 2022-12-03 DIAGNOSIS — G2569 Other tics of organic origin: Secondary | ICD-10-CM

## 2022-12-03 DIAGNOSIS — C3492 Malignant neoplasm of unspecified part of left bronchus or lung: Secondary | ICD-10-CM | POA: Diagnosis not present

## 2022-12-03 MED ORDER — CLONIDINE HCL 0.1 MG PO TABS: 0.1000 mg | ORAL_TABLET | Freq: Every day | ORAL | 3 refills | Status: DC

## 2022-12-03 NOTE — Patient Instructions (Addendum)
It was good to see you again today!   Your BP is a bit high- let's have you try taking 2 of the 2.5 mg amlodipine (5 mg total) for a couple of weeks and let me know how your BP looks We may want to go up to 5 mg total  However, don't increase your amlodipine until you see how the daily clonidine is working for you- this may be enough to bring your BP down to goal (about 140/80 or so)

## 2022-12-10 DIAGNOSIS — R931 Abnormal findings on diagnostic imaging of heart and coronary circulation: Secondary | ICD-10-CM | POA: Diagnosis not present

## 2022-12-10 DIAGNOSIS — Z01812 Encounter for preprocedural laboratory examination: Secondary | ICD-10-CM | POA: Diagnosis not present

## 2022-12-10 DIAGNOSIS — K449 Diaphragmatic hernia without obstruction or gangrene: Secondary | ICD-10-CM | POA: Diagnosis not present

## 2022-12-10 DIAGNOSIS — I517 Cardiomegaly: Secondary | ICD-10-CM | POA: Diagnosis not present

## 2022-12-10 DIAGNOSIS — J95811 Postprocedural pneumothorax: Secondary | ICD-10-CM | POA: Diagnosis not present

## 2022-12-10 DIAGNOSIS — R918 Other nonspecific abnormal finding of lung field: Secondary | ICD-10-CM | POA: Diagnosis not present

## 2022-12-10 DIAGNOSIS — C3431 Malignant neoplasm of lower lobe, right bronchus or lung: Secondary | ICD-10-CM | POA: Diagnosis not present

## 2022-12-26 DIAGNOSIS — R918 Other nonspecific abnormal finding of lung field: Secondary | ICD-10-CM | POA: Diagnosis not present

## 2022-12-29 DIAGNOSIS — C3431 Malignant neoplasm of lower lobe, right bronchus or lung: Secondary | ICD-10-CM | POA: Diagnosis not present

## 2022-12-29 DIAGNOSIS — C3491 Malignant neoplasm of unspecified part of right bronchus or lung: Secondary | ICD-10-CM | POA: Diagnosis not present

## 2023-01-05 DIAGNOSIS — R918 Other nonspecific abnormal finding of lung field: Secondary | ICD-10-CM | POA: Diagnosis not present

## 2023-01-19 DIAGNOSIS — Z886 Allergy status to analgesic agent status: Secondary | ICD-10-CM | POA: Diagnosis not present

## 2023-01-19 DIAGNOSIS — Z01818 Encounter for other preprocedural examination: Secondary | ICD-10-CM | POA: Diagnosis not present

## 2023-01-19 DIAGNOSIS — Z8616 Personal history of COVID-19: Secondary | ICD-10-CM | POA: Diagnosis not present

## 2023-01-19 DIAGNOSIS — C3491 Malignant neoplasm of unspecified part of right bronchus or lung: Secondary | ICD-10-CM | POA: Diagnosis not present

## 2023-01-19 DIAGNOSIS — C771 Secondary and unspecified malignant neoplasm of intrathoracic lymph nodes: Secondary | ICD-10-CM | POA: Diagnosis not present

## 2023-01-19 DIAGNOSIS — Z85828 Personal history of other malignant neoplasm of skin: Secondary | ICD-10-CM | POA: Diagnosis not present

## 2023-01-19 DIAGNOSIS — Z923 Personal history of irradiation: Secondary | ICD-10-CM | POA: Diagnosis not present

## 2023-01-19 DIAGNOSIS — D72829 Elevated white blood cell count, unspecified: Secondary | ICD-10-CM | POA: Diagnosis not present

## 2023-01-19 DIAGNOSIS — I2510 Atherosclerotic heart disease of native coronary artery without angina pectoris: Secondary | ICD-10-CM | POA: Diagnosis not present

## 2023-01-19 DIAGNOSIS — E785 Hyperlipidemia, unspecified: Secondary | ICD-10-CM | POA: Diagnosis not present

## 2023-01-19 DIAGNOSIS — Z87891 Personal history of nicotine dependence: Secondary | ICD-10-CM | POA: Diagnosis not present

## 2023-01-19 DIAGNOSIS — J90 Pleural effusion, not elsewhere classified: Secondary | ICD-10-CM | POA: Diagnosis not present

## 2023-01-19 DIAGNOSIS — J939 Pneumothorax, unspecified: Secondary | ICD-10-CM | POA: Diagnosis not present

## 2023-01-19 DIAGNOSIS — Z853 Personal history of malignant neoplasm of breast: Secondary | ICD-10-CM | POA: Diagnosis not present

## 2023-01-19 DIAGNOSIS — Z4682 Encounter for fitting and adjustment of non-vascular catheter: Secondary | ICD-10-CM | POA: Diagnosis not present

## 2023-01-19 DIAGNOSIS — J982 Interstitial emphysema: Secondary | ICD-10-CM | POA: Diagnosis not present

## 2023-01-19 DIAGNOSIS — I110 Hypertensive heart disease with heart failure: Secondary | ICD-10-CM | POA: Diagnosis not present

## 2023-01-19 DIAGNOSIS — T797XXA Traumatic subcutaneous emphysema, initial encounter: Secondary | ICD-10-CM | POA: Diagnosis not present

## 2023-01-19 DIAGNOSIS — J9811 Atelectasis: Secondary | ICD-10-CM | POA: Diagnosis not present

## 2023-01-19 DIAGNOSIS — Z9842 Cataract extraction status, left eye: Secondary | ICD-10-CM | POA: Diagnosis not present

## 2023-01-19 DIAGNOSIS — M810 Age-related osteoporosis without current pathological fracture: Secondary | ICD-10-CM | POA: Diagnosis not present

## 2023-01-19 DIAGNOSIS — I480 Paroxysmal atrial fibrillation: Secondary | ICD-10-CM | POA: Diagnosis not present

## 2023-01-19 DIAGNOSIS — I10 Essential (primary) hypertension: Secondary | ICD-10-CM | POA: Diagnosis not present

## 2023-01-19 DIAGNOSIS — E039 Hypothyroidism, unspecified: Secondary | ICD-10-CM | POA: Diagnosis not present

## 2023-01-19 DIAGNOSIS — I517 Cardiomegaly: Secondary | ICD-10-CM | POA: Diagnosis not present

## 2023-01-19 DIAGNOSIS — I4892 Unspecified atrial flutter: Secondary | ICD-10-CM | POA: Diagnosis not present

## 2023-01-19 DIAGNOSIS — Z90710 Acquired absence of both cervix and uterus: Secondary | ICD-10-CM | POA: Diagnosis not present

## 2023-01-19 DIAGNOSIS — I4891 Unspecified atrial fibrillation: Secondary | ICD-10-CM | POA: Diagnosis not present

## 2023-01-19 DIAGNOSIS — I509 Heart failure, unspecified: Secondary | ICD-10-CM | POA: Diagnosis not present

## 2023-01-19 DIAGNOSIS — Z79899 Other long term (current) drug therapy: Secondary | ICD-10-CM | POA: Diagnosis not present

## 2023-01-19 DIAGNOSIS — Z9841 Cataract extraction status, right eye: Secondary | ICD-10-CM | POA: Diagnosis not present

## 2023-01-19 DIAGNOSIS — C3431 Malignant neoplasm of lower lobe, right bronchus or lung: Secondary | ICD-10-CM | POA: Diagnosis not present

## 2023-01-20 ENCOUNTER — Ambulatory Visit: Payer: Medicare Other

## 2023-01-20 DIAGNOSIS — Z902 Acquired absence of lung [part of]: Secondary | ICD-10-CM

## 2023-01-20 DIAGNOSIS — C771 Secondary and unspecified malignant neoplasm of intrathoracic lymph nodes: Secondary | ICD-10-CM | POA: Diagnosis not present

## 2023-01-20 DIAGNOSIS — C3431 Malignant neoplasm of lower lobe, right bronchus or lung: Secondary | ICD-10-CM | POA: Insufficient documentation

## 2023-01-20 HISTORY — DX: Acquired absence of lung (part of): Z90.2

## 2023-01-20 HISTORY — DX: Malignant neoplasm of lower lobe, right bronchus or lung: C34.31

## 2023-01-20 HISTORY — PX: LOBECTOMY: SHX5089

## 2023-01-21 DIAGNOSIS — G8918 Other acute postprocedural pain: Secondary | ICD-10-CM

## 2023-01-21 HISTORY — DX: Other acute postprocedural pain: G89.18

## 2023-01-26 NOTE — Progress Notes (Deleted)
[facility] [address] [place], [place] [address] [phone]  Date:  01/29/2023   Name:  [name]   DOB:  04/07/1931   MRN:  [id number]  PCP:  Pearline Cables, MD    Chief Complaint: No chief complaint on file.   History of Present Illness:  [name] is a 89 y.o. very pleasant female patient who presents with the following:  Patient seen today for periodic follow-up. History of hypertension, hyperlipidemia, hypothyroidism, breast cancer, more recently some difficulty with paranoia and anxiety Also, in August 2021 she had a fall and sustained an orbital fracture, subarachnoid hemorrhage, and thoracic compression fracture.  She had a kyphoplasty in February of 2022.  She notes good results from her back surgery- she does wear a back brace which helps her   Most recent visit with myself was in February  At that time she had recently been admitted to the hospital after a fall, during her injury assessment she was noted to have lung cancer-she chose to pursue treatment at [facility] She just underwent a fluoroscopy with lobectomy on April 9  ####### she may still be admitted  4/14: Will CT to water seal today, questionable air leak seen. With regards to afib, will obtain labs today to assess electrolytes and start metoprolol to control. Will avoid anticoagulation at this point given age with regards to risk-benefit consideration.  RLL adenocarcinoma s/p VATS Right Lower Lobectomy 01/20/23 - Pain: Oxycodone prn, fentanyl prn, Tylenol scheduled, Ibuprofen scheduled  - DVT ppx: SCDs, SQH - Bowel reg: Senokot-S - Ambulation TID, IS use - CT to water seal, removed 4/13 and replaced with pigtail given concern for interval retraction #Hypothyroidism -Levothyroxine 75 mcg daily    Patient Active Problem List   Diagnosis Date Noted   Rib fractures 11/21/2022   Right lower lobe lung mass 11/21/2022   Autoimmune disease  07/31/2021   Barrett's esophagus 07/31/2021   Bone lesion 07/31/2021   Cervical cancer 07/31/2021   Dysphagia 07/31/2021   Hypothyroidism 07/31/2021   Chronic midline thoracic back pain 12/31/2020   Paroxysmal SVT (supraventricular tachycardia) 12/23/2020   Intracranial hemorrhage    TBI (traumatic brain injury) 05/04/2020   Back pain 05/04/2020   SAH (subarachnoid hemorrhage) 04/29/2020   Orbital fracture, closed, initial encounter 04/29/2020   Basal cell carcinoma (BCC) in situ of skin 04/04/2020   Essential hypertension 03/21/2019   Stage 3a chronic kidney disease    Systolic murmur 02/23/2019   Hypercalcemia    Dizziness 02/22/2019   Squamous cell carcinoma in situ of skin 11/30/2018   PVC (premature ventricular contraction) 09/08/2017   Glaucoma 09/08/2017   Hx of meningioma of the brain 08/28/2017   Hoarseness of voice 02/28/2016   Raynaud's phenomenon 02/28/2016   Esophageal spasm 09/20/2013   Osteoporosis 03/18/2012   Humerus fracture 03/2012   Fracture, shoulder 03/06/2012   Hyperlipidemia    Breast cancer     Past Medical History:  Diagnosis Date   Acute blood loss anemia    AKI (acute kidney injury) (HCC)    Autoimmune disease (HCC)    Back pain 05/04/2020   Barrett's esophagus    Basal cell carcinoma (BCC) in situ of skin 04/04/2020   Path 03/2020, [facility]    Bone lesion    sacrum   Breast cancer (HCC) 2010   cT1 N0 M0; ER+/ PR+/ HER2 neg; s/p lumpectomy 07/2010; started adjuvant hormonal therapy  10/2009   Cellulitis of skin 12/31/2020   Cervical cancer [facility])    Chronic midline thoracic back pain 12/31/2020   Diarrhea 09/08/2017   Dizziness 02/22/2019   Dysphagia    Elevated serum creatinine 09/08/2017   Esophageal spasm 09/20/2013   Essential hypertension 03/21/2019   Fall at home, initial encounter 04/29/2020   Fracture, shoulder 03/06/2012   Pt seen at [facility] by Dr. Bradly Bienenstock on 03/09/2012 and 03/11/2012.   X-rays taken of left  wrist, left elbow, and left shoulder.   Shoulder Fx: nondisplaced proximal humerus fracture involving the greater tuberosity. Wrist: consistent with an old distal radius fracture with shortening present.       Glaucoma    Hoarseness of voice 02/28/2016   Humerus fracture 03/2012   impacted left humueral neck fracture, treated by Dr. Amanda Pea   Hx of meningioma of the brain 08/28/2017   Stable since 2010- 2018 on MRI   Hypercalcemia    Hyperlipidemia    Hypertension    Hypothyroidism    Intracranial hemorrhage (HCC)    Nausea vomiting and diarrhea 09/08/2017   Orbital fracture, closed, initial encounter (HCC) 04/29/2020   Osteoporosis 03/18/2012   Paroxysmal SVT (supraventricular tachycardia) 12/23/2020   Pneumonia    PVC (premature ventricular contraction) 09/08/2017   Raynaud's phenomenon 02/28/2016   SAH (subarachnoid hemorrhage) (HCC) 04/29/2020   Squamous cell carcinoma in situ of skin 11/30/2018   Path 11/1008, Dermatology managing    Stage 3a chronic kidney disease    Systolic murmur 02/23/2019   TBI (traumatic brain injury) (HCC) 05/04/2020    Past Surgical History:  Procedure Laterality Date   ABDOMINAL HYSTERECTOMY  1972   cancer          1 tube 1 ovary   APPENDECTOMY     BACK SURGERY  11/28/2020   BREAST LUMPECTOMY Left 2010   colon polyp removal     EYE SURGERY     HAND SURGERY     KYPHOPLASTY N/A 11/28/2020   Procedure: KYPHOPLASTY THORACIC TWELVE AND LUMBAR ONE;  Surgeon: Venita Lick, MD;  Location: [facility] OR;  Service: Orthopedics;  Laterality: N/A;  90 mins   ortho procedures     multiple   OVARIAN CYST REMOVAL  1978   SHOULDER SURGERY  1954   left    Social History   Tobacco Use   Smoking status: Former    Types: Cigarettes    Quit date: 12/16/1986    Years since quitting: 36.1   Smokeless tobacco: Never   Tobacco comments:    FORMER SMOKER 30 YEARS AGO  Vaping Use   Vaping Use: Never used  Substance Use Topics   Alcohol use: Yes    Alcohol/week: 0.0  standard drinks of alcohol    Comment: OCCASIONALLY   Drug use: No    Family History  Problem Relation Age of Onset   Heart disease Father    Cancer Sister        lung, kidney   Cancer Brother        brain   Cancer Paternal Aunt        breast   Breast cancer Paternal Aunt    Cancer Brother        liver, lung   Osteoporosis Neg Hx     Allergies  Allergen Reactions   Daypro [Oxaprozin] Swelling    Face and tongue   Prolia [Denosumab]     unknown    Medication list has been reviewed and updated.  Current Outpatient Medications on File Prior to Visit  Medication Sig Dispense Refill   acetaminophen (TYLENOL) 500 MG tablet Take 2 tablets (1,000 mg total) by mouth 3 (three) times daily as needed for mild pain.     amLODipine (NORVASC) 2.5 MG tablet Take 1 tablet (2.5 mg total) by mouth daily. 90 tablet 3   Calcium Carbonate-Vitamin D (CALCIUM + D PO) Take 1 capsule by mouth daily.      cloNIDine (CATAPRES) 0.1 MG tablet Take 1 tablet (0.1 mg total) by mouth daily. Use if needed for vocal tic 30 tablet 3   latanoprost (XALATAN) 0.005 % ophthalmic solution Place 1 drop into both eyes at bedtime.      levothyroxine (SYNTHROID) 75 MCG tablet TAKE 1 TABLET BY MOUTH DAILY BEFORE BREAKFAST 90 tablet 3   lidocaine (LIDODERM) 5 % Place 1 patch onto the skin daily. Remove & Discard patch within 12 hours or as directed by MD. Apply to left side chest wall at site of pain 10 patch 0   methocarbamol (ROBAXIN) 500 MG tablet Take 1 tablet (500 mg total) by mouth 3 (three) times daily as needed for muscle spasms. 15 tablet 0   Multiple Vitamin (MULTIVITAMIN WITH MINERALS) TABS tablet Take 1 tablet by mouth daily. One a day     polyethylene glycol (MIRALAX / GLYCOLAX) 17 g packet Take 17 g by mouth daily as needed for mild constipation or moderate constipation. 14 each 0   rosuvastatin (CRESTOR) 10 MG tablet Take 1 tablet (10 mg total) by mouth daily. TAKE 1 BY MOUTH DAILY 90 tablet 3   traMADol  (ULTRAM) 50 MG tablet Take 1 tablet (50 mg total) by mouth every 8 (eight) hours as needed for moderate pain or severe pain. 10 tablet 0   No current facility-administered medications on file prior to visit.    Review of Systems:  As per HPI- otherwise negative.   Physical Examination: There were no vitals filed for this visit. There were no vitals filed for this visit. There is no height or weight on file to calculate BMI. Ideal Body Weight:    ***  Assessment and Plan: ***  Signed Abbe Amsterdam, MD

## 2023-01-27 ENCOUNTER — Ambulatory Visit: Payer: Medicare Other

## 2023-01-29 ENCOUNTER — Telehealth (HOSPITAL_COMMUNITY): Payer: Self-pay

## 2023-01-29 ENCOUNTER — Ambulatory Visit: Payer: Medicare Other | Admitting: Family Medicine

## 2023-01-29 NOTE — Telephone Encounter (Signed)
Pt's daughter called and stated that pt has a pulmonary rehab referral from [facility]. I advised pt that [facility] will have to fax over the referral.

## 2023-01-30 ENCOUNTER — Ambulatory Visit: Payer: Self-pay

## 2023-01-30 ENCOUNTER — Other Ambulatory Visit: Payer: Self-pay

## 2023-01-30 ENCOUNTER — Emergency Department (HOSPITAL_BASED_OUTPATIENT_CLINIC_OR_DEPARTMENT_OTHER): Payer: Medicare Other

## 2023-01-30 ENCOUNTER — Encounter: Payer: Self-pay | Admitting: *Deleted

## 2023-01-30 ENCOUNTER — Encounter (HOSPITAL_BASED_OUTPATIENT_CLINIC_OR_DEPARTMENT_OTHER): Payer: Self-pay | Admitting: Emergency Medicine

## 2023-01-30 ENCOUNTER — Observation Stay (HOSPITAL_BASED_OUTPATIENT_CLINIC_OR_DEPARTMENT_OTHER)
Admission: EM | Admit: 2023-01-30 | Discharge: 2023-01-31 | Disposition: A | Discharge status: Home / Self Care | Payer: Medicare Other | Attending: Internal Medicine | Admitting: Internal Medicine

## 2023-01-30 ENCOUNTER — Telehealth: Payer: Self-pay | Admitting: *Deleted

## 2023-01-30 DIAGNOSIS — C3431 Malignant neoplasm of lower lobe, right bronchus or lung: Secondary | ICD-10-CM

## 2023-01-30 DIAGNOSIS — J948 Other specified pleural conditions: Secondary | ICD-10-CM | POA: Diagnosis not present

## 2023-01-30 DIAGNOSIS — Z79899 Other long term (current) drug therapy: Secondary | ICD-10-CM | POA: Insufficient documentation

## 2023-01-30 DIAGNOSIS — Z85828 Personal history of other malignant neoplasm of skin: Secondary | ICD-10-CM | POA: Diagnosis not present

## 2023-01-30 DIAGNOSIS — R197 Diarrhea, unspecified: Secondary | ICD-10-CM | POA: Diagnosis not present

## 2023-01-30 DIAGNOSIS — Z87891 Personal history of nicotine dependence: Secondary | ICD-10-CM | POA: Diagnosis not present

## 2023-01-30 DIAGNOSIS — E785 Hyperlipidemia, unspecified: Secondary | ICD-10-CM | POA: Diagnosis present

## 2023-01-30 DIAGNOSIS — E039 Hypothyroidism, unspecified: Secondary | ICD-10-CM

## 2023-01-30 DIAGNOSIS — K5100 Ulcerative (chronic) pancolitis without complications: Secondary | ICD-10-CM | POA: Diagnosis not present

## 2023-01-30 DIAGNOSIS — K3189 Other diseases of stomach and duodenum: Secondary | ICD-10-CM | POA: Diagnosis not present

## 2023-01-30 DIAGNOSIS — Z8541 Personal history of malignant neoplasm of cervix uteri: Secondary | ICD-10-CM | POA: Insufficient documentation

## 2023-01-30 DIAGNOSIS — E860 Dehydration: Secondary | ICD-10-CM

## 2023-01-30 DIAGNOSIS — J90 Pleural effusion, not elsewhere classified: Secondary | ICD-10-CM | POA: Diagnosis not present

## 2023-01-30 DIAGNOSIS — J439 Emphysema, unspecified: Secondary | ICD-10-CM | POA: Diagnosis not present

## 2023-01-30 DIAGNOSIS — I10 Essential (primary) hypertension: Secondary | ICD-10-CM | POA: Diagnosis not present

## 2023-01-30 DIAGNOSIS — E782 Mixed hyperlipidemia: Secondary | ICD-10-CM | POA: Diagnosis not present

## 2023-01-30 DIAGNOSIS — I480 Paroxysmal atrial fibrillation: Secondary | ICD-10-CM | POA: Insufficient documentation

## 2023-01-30 DIAGNOSIS — I4891 Unspecified atrial fibrillation: Secondary | ICD-10-CM

## 2023-01-30 DIAGNOSIS — Z853 Personal history of malignant neoplasm of breast: Secondary | ICD-10-CM | POA: Insufficient documentation

## 2023-01-30 DIAGNOSIS — A419 Sepsis, unspecified organism: Secondary | ICD-10-CM | POA: Diagnosis not present

## 2023-01-30 DIAGNOSIS — N1831 Chronic kidney disease, stage 3a: Secondary | ICD-10-CM

## 2023-01-30 DIAGNOSIS — J939 Pneumothorax, unspecified: Secondary | ICD-10-CM | POA: Diagnosis not present

## 2023-01-30 DIAGNOSIS — I129 Hypertensive chronic kidney disease with stage 1 through stage 4 chronic kidney disease, or unspecified chronic kidney disease: Secondary | ICD-10-CM | POA: Diagnosis not present

## 2023-01-30 DIAGNOSIS — K6389 Other specified diseases of intestine: Secondary | ICD-10-CM | POA: Diagnosis not present

## 2023-01-30 HISTORY — DX: Ulcerative (chronic) pancolitis without complications: K51.00

## 2023-01-30 HISTORY — DX: Malignant neoplasm of lower lobe, right bronchus or lung: C34.31

## 2023-01-30 HISTORY — DX: Other specified pleural conditions: J94.8

## 2023-01-30 HISTORY — DX: Unspecified atrial fibrillation: I48.91

## 2023-01-30 LAB — URINALYSIS, ROUTINE W REFLEX MICROSCOPIC
Bilirubin Urine: NEGATIVE
Glucose, UA: NEGATIVE mg/dL
Hgb urine dipstick: NEGATIVE
Ketones, ur: NEGATIVE mg/dL
Nitrite: NEGATIVE
Protein, ur: NEGATIVE mg/dL
Specific Gravity, Urine: <=1.005 (ref 1.005–1.030)
pH: 6 (ref 5.0–8.0)

## 2023-01-30 LAB — CBC WITH DIFFERENTIAL/PLATELET
Abs Immature Granulocytes: 1.15 10*3/uL — ABNORMAL HIGH (ref 0.00–0.07)
Basophils Absolute: 0.1 10*3/uL (ref 0.0–0.1)
Basophils Relative: 0 %
Eosinophils Absolute: 0.2 10*3/uL (ref 0.0–0.5)
Eosinophils Relative: 1 %
HCT: 36.4 % (ref 36.0–46.0)
Hemoglobin: 11.9 g/dL — ABNORMAL LOW (ref 12.0–15.0)
Immature Granulocytes: 3 %
Lymphocytes Relative: 5 %
Lymphs Abs: 1.7 10*3/uL (ref 0.7–4.0)
MCH: 30.7 pg (ref 26.0–34.0)
MCHC: 32.7 g/dL (ref 30.0–36.0)
MCV: 93.8 fL (ref 80.0–100.0)
Monocytes Absolute: 1.7 10*3/uL — ABNORMAL HIGH (ref 0.1–1.0)
Monocytes Relative: 5 %
Neutro Abs: 31.7 10*3/uL — ABNORMAL HIGH (ref 1.7–7.7)
Neutrophils Relative %: 86 %
Platelets: 410 10*3/uL — ABNORMAL HIGH (ref 150–400)
RBC: 3.88 MIL/uL (ref 3.87–5.11)
RDW: 15.8 % — ABNORMAL HIGH (ref 11.5–15.5)
Smear Review: NORMAL
WBC: 36.7 10*3/uL — ABNORMAL HIGH (ref 4.0–10.5)
nRBC: 0 % (ref 0.0–0.2)

## 2023-01-30 LAB — COMPREHENSIVE METABOLIC PANEL
ALT: 68 U/L — ABNORMAL HIGH (ref 0–44)
AST: 55 U/L — ABNORMAL HIGH (ref 15–41)
Albumin: 2.7 g/dL — ABNORMAL LOW (ref 3.5–5.0)
Alkaline Phosphatase: 341 U/L — ABNORMAL HIGH (ref 38–126)
Anion gap: 11 (ref 5–15)
BUN: 15 mg/dL (ref 8–23)
CO2: 23 mmol/L (ref 22–32)
Calcium: 8.5 mg/dL — ABNORMAL LOW (ref 8.9–10.3)
Chloride: 99 mmol/L (ref 98–111)
Creatinine, Ser: 0.91 mg/dL (ref 0.44–1.00)
GFR, Estimated: >60 mL/min (ref >60)
Glucose, Bld: 101 mg/dL — ABNORMAL HIGH (ref 70–99)
Potassium: 4 mmol/L (ref 3.5–5.1)
Sodium: 133 mmol/L — ABNORMAL LOW (ref 135–145)
Total Bilirubin: 0.4 mg/dL (ref 0.3–1.2)
Total Protein: 7 g/dL (ref 6.5–8.1)

## 2023-01-30 LAB — URINALYSIS, MICROSCOPIC (REFLEX): RBC / HPF: NONE SEEN RBC/hpf (ref 0–5)

## 2023-01-30 LAB — LACTIC ACID, PLASMA: Lactic Acid, Venous: 1 mmol/L (ref 0.5–1.9)

## 2023-01-30 LAB — LIPASE, BLOOD: Lipase: 27 U/L (ref 11–51)

## 2023-01-30 MED ORDER — LEVOTHYROXINE SODIUM 50 MCG PO TABS
75.0000 ug | ORAL_TABLET | Freq: Every day | ORAL | Status: DC
  Administered 2023-01-31: 75 ug via ORAL
  Filled 2023-01-30: qty 1

## 2023-01-30 MED ORDER — METOPROLOL TARTRATE 25 MG PO TABS
25.0000 mg | ORAL_TABLET | Freq: Once | ORAL | Status: AC
  Administered 2023-01-30: 25 mg via ORAL
  Filled 2023-01-30: qty 1

## 2023-01-30 MED ORDER — IOHEXOL 300 MG/ML  SOLN
75.0000 mL | Freq: Once | INTRAMUSCULAR | Status: AC | PRN
  Administered 2023-01-30: 75 mL via INTRAVENOUS

## 2023-01-30 MED ORDER — SODIUM CHLORIDE 0.9% FLUSH
3.0000 mL | Freq: Two times a day (BID) | INTRAVENOUS | Status: DC
  Administered 2023-01-31: 3 mL via INTRAVENOUS

## 2023-01-30 MED ORDER — AMLODIPINE BESYLATE 5 MG PO TABS
2.5000 mg | ORAL_TABLET | Freq: Every day | ORAL | Status: DC
  Administered 2023-01-31: 2.5 mg via ORAL
  Filled 2023-01-30: qty 1

## 2023-01-30 MED ORDER — OXYCODONE HCL 5 MG PO TABS
5.0000 mg | ORAL_TABLET | ORAL | Status: DC | PRN
  Filled 2023-01-30: qty 1

## 2023-01-30 MED ORDER — FENTANYL CITRATE PF 50 MCG/ML IJ SOSY: 25.0000 ug | PREFILLED_SYRINGE | INTRAMUSCULAR | Status: DC | PRN

## 2023-01-30 MED ORDER — ONDANSETRON HCL 4 MG/2ML IJ SOLN: 4.0000 mg | Freq: Four times a day (QID) | INTRAMUSCULAR | Status: DC | PRN

## 2023-01-30 MED ORDER — SODIUM CHLORIDE 0.9 % IV SOLN: INTRAVENOUS | Status: AC

## 2023-01-30 MED ORDER — ACETAMINOPHEN 650 MG RE SUPP: 650.0000 mg | Freq: Four times a day (QID) | RECTAL | Status: DC | PRN

## 2023-01-30 MED ORDER — HEPARIN SODIUM (PORCINE) 5000 UNIT/ML IJ SOLN
5000.0000 [IU] | Freq: Three times a day (TID) | INTRAMUSCULAR | Status: DC
  Administered 2023-01-31: 5000 [IU] via SUBCUTANEOUS
  Filled 2023-01-30 (×2): qty 1

## 2023-01-30 MED ORDER — SODIUM CHLORIDE 0.9 % IV BOLUS
500.0000 mL | Freq: Once | INTRAVENOUS | Status: AC
  Administered 2023-01-30: 500 mL via INTRAVENOUS

## 2023-01-30 MED ORDER — ONDANSETRON HCL 4 MG PO TABS: 4.0000 mg | ORAL_TABLET | Freq: Four times a day (QID) | ORAL | Status: DC | PRN

## 2023-01-30 MED ORDER — ACETAMINOPHEN 325 MG PO TABS
650.0000 mg | ORAL_TABLET | Freq: Four times a day (QID) | ORAL | Status: DC | PRN
  Administered 2023-01-31: 650 mg via ORAL
  Filled 2023-01-30: qty 2

## 2023-01-30 MED ORDER — METOPROLOL TARTRATE 5 MG/5ML IV SOLN: 5.0000 mg | Freq: Once | INTRAVENOUS | Status: DC

## 2023-01-30 MED ORDER — METHOCARBAMOL 500 MG PO TABS: 500.0000 mg | ORAL_TABLET | Freq: Three times a day (TID) | ORAL | Status: DC | PRN

## 2023-01-30 MED ORDER — ROSUVASTATIN CALCIUM 10 MG PO TABS
10.0000 mg | ORAL_TABLET | Freq: Every day | ORAL | Status: DC
  Administered 2023-01-31: 10 mg via ORAL
  Filled 2023-01-30: qty 1

## 2023-01-30 NOTE — ED Notes (Signed)
[facility] called for transport. 

## 2023-01-30 NOTE — Transitions of Care (Post Inpatient/ED Visit) (Signed)
01/30/2023  Name: [name] MRN: [id number] DOB: [birth year]  Today's TOC FU Call Status: Today's TOC FU Call Status:: Successful TOC FU Call Competed TOC FU Call Complete Date: 01/30/23  Transition Care Management Follow-up Telephone Call Date of Discharge: 01/28/23 Discharge Facility: Other ([facility] Facility) Name of Other ([facility]) [facility]charge Facility: [facility] Type of Discharge: Inpatient Admission Primary Inpatient Discharge Diagnosis:: surgical (R) lower lobe lobectomy secondary to cancer; A-fib/ flutter How have you been since you were released from the hospital?: Better ("I am getting better overall; trying to eat better and am eating breakfast now.  I am able to do just about everything I need to without any help, after this surgery and so far, not having any problems") Any questions or concerns?: No  Items Reviewed: Did you receive and understand the discharge instructions provided?: Yes Medications obtained and verified?: Yes (Medications Reviewed) (Partial medication review completed- patient declines full review; confirmed patient obtained/ is taking all newly Rx'd medications as instructed; self-manages medications and denies questions/ concerns around medications today) Any new allergies since your discharge?: No Dietary orders reviewed?: Yes Type of Diet Ordered:: "trying to eat as healthy as I can" Do you have support at home?: Yes People in Home: spouse Name of Support/Comfort Primary Source: Reports independent in self-care activities; supportive spouse and local adult daughters assists as/ if needed/ indicated  Home Care and Equipment/Supplies: Were Home Health Services Ordered?: No Any new equipment or medical supplies ordered?: No  Functional Questionnaire: Do you need assistance with bathing/showering or dressing?: Yes (family assists as/ if needed) Do you need assistance with meal preparation?: Yes (family assists as/ if needed) Do you need  assistance with eating?: No Do you have difficulty maintaining continence: No Do you need assistance with getting out of bed/getting out of a chair/moving?: No Do you have difficulty managing or taking your medications?: No (family assists as/ if needed)  Follow up appointments reviewed: PCP Follow-up appointment confirmed?: Yes (care coordination outreach in real-time with scheduling care guide to successfully schedule hospital follow up PCP appointment 02/09/23) Date of PCP follow-up appointment?: 02/09/23 (verified with care guide this is earliest appointment available) Follow-up Provider: PCP Specialist Hospital Follow-up appointment confirmed?: Yes Date of Specialist follow-up appointment?:  ("Yes, I have the appointment scheduled with the surgeon at [facility], but I don't have the exact date in front of me- my daughter is handling all of the [facility] appointments, and she is not here right now") Follow-Up Specialty Provider:: Surgeon at [facility] Do you need transportation to your follow-up appointment?: No Do you understand care options if your condition(s) worsen?: Yes-patient verbalized understanding  SDOH Interventions Today    Flowsheet Row Most Recent Value  SDOH Interventions   Food Insecurity Interventions Intervention Not Indicated  Transportation Interventions Intervention Not Indicated  [husband/ family provides transportation]      TOC Interventions Today    Flowsheet Row Most Recent Value  TOC Interventions   TOC Interventions Discussed/Reviewed TOC Interventions Discussed, Arranged PCP follow up less than 12 days/Care Guide scheduled      Interventions Today    Flowsheet Row Most Recent Value  Chronic Disease   Chronic disease during today's visit Other  [lobectomy secondary to CA]  General Interventions   General Interventions Discussed/Reviewed General Interventions Discussed, Doctor Visits  Doctor Visits Discussed/Reviewed PCP, Specialist, Doctor Visits Discussed   PCP/Specialist Visits Compliance with follow-up visit  Nutrition Interventions   Nutrition Discussed/Reviewed Nutrition Discussed  Pharmacy Interventions   Pharmacy Dicussed/Reviewed Pharmacy  Topics Discussed  Safety Interventions   Safety Discussed/Reviewed Safety Discussed  [confirmed not requiring assistive devices for ambulation- occasionally uses walker when she goes outside of her home]      Caryl Pina, RN, BSN, CCRN Alumnus RN CM Care Coordination/ Transition of Care- [facility] Care Management [phone]: direct office

## 2023-01-30 NOTE — H&P (Signed)
History and Physical    [name] ZOX:[id number] DOB: 24-Aug-1931 DOA: 01/30/2023  PCP: Pearline Cables, MD   Patient coming from: Home  Chief Complaint: Diarrhea   HPI: [name] is a 89 y.o. female with medical history significant for hypertension, hyperlipidemia, hypothyroidism, CKD 3A, breast cancer, adenocarcinoma status post right lower lobectomy on 01/20/2023, and paroxysmal atrial fibrillation not anticoagulated who presents to the emergency department with diarrhea.  Patient reports that she had intermittent diarrhea while hospitalized at [facility] earlier this month but had been doing well since her discharge a few days ago until this morning when she had large-volume watery diarrhea.  She went on to have another episode shortly after, and then additional episodes where she was unable to make it to the bathroom.  She denies fevers, abdominal pain, nausea, or vomiting associated with this.  She denies history of C. difficile colitis.  She also denies chest pain, shortness of breath, or cough.  Patient took Imodium at home and has not had any diarrhea since then.    [facility] ED Course: Upon arrival to the ED, patient is found to be afebrile and saturating well on room air with stable blood pressure.  EKG demonstrates atrial fibrillation with rate 124.  CT of the chest, abdomen, and pelvis is most notable for diffuse wall thickening of the colon with mucosal splint, dilated loops of small bowel without definite transition point, small right apical and small right basilar hydropneumothorax, extensive pneumomediastinum, unchanged small to moderate pericardial effusion, and patchy groundglass opacities in the posterior right lower lung.  Labs most notable for WBC 36,700, platelets 410,000, alkaline phosphatase 341, albumin 2.7, AST 55, and ALT 68.  Patient was given 500 mL of normal saline and 25 mg oral Lopressor in the ED.  She was transferred to [facility] for admission.  Review  of Systems:  All other systems reviewed and apart from HPI, are negative.  Past Medical History:  Diagnosis Date   Acute blood loss anemia    AKI (acute kidney injury)    Autoimmune disease    Back pain 05/04/2020   Barrett's esophagus    Basal cell carcinoma (BCC) in situ of skin 04/04/2020   Path 03/2020, [facility]    Bone lesion    sacrum   Breast cancer 2010   cT1 N0 M0; ER+/ PR+/ HER2 neg; s/p lumpectomy 07/2010; started adjuvant hormonal therapy 10/2009   Cellulitis of skin 12/31/2020   Cervical cancer    Chronic midline thoracic back pain 12/31/2020   Diarrhea 09/08/2017   Dizziness 02/22/2019   Dysphagia    Elevated serum creatinine 09/08/2017   Esophageal spasm 09/20/2013   Essential hypertension 03/21/2019   Fall at home, initial encounter 04/29/2020   Fracture, shoulder 03/06/2012   Pt seen at [facility] by Dr. Bradly Bienenstock on 03/09/2012 and 03/11/2012.   X-rays taken of left wrist, left elbow, and left shoulder.   Shoulder Fx: nondisplaced proximal humerus fracture involving the greater tuberosity. Wrist: consistent with an old distal radius fracture with shortening present.       Glaucoma    Hoarseness of voice 02/28/2016   Humerus fracture 03/2012   impacted left humueral neck fracture, treated by Dr. Amanda Pea   Hx of meningioma of the brain 08/28/2017   Stable since 2010- 2018 on MRI   Hypercalcemia    Hyperlipidemia    Hypertension    Hypothyroidism    Intracranial hemorrhage    Nausea vomiting and  diarrhea 09/08/2017   Orbital fracture, closed, initial encounter 04/29/2020   Osteoporosis 03/18/2012   Paroxysmal SVT (supraventricular tachycardia) 12/23/2020   Pneumonia    PVC (premature ventricular contraction) 09/08/2017   Raynaud's phenomenon 02/28/2016   SAH (subarachnoid hemorrhage) 04/29/2020   Squamous cell carcinoma in situ of skin 11/30/2018   Path 11/1008, D[facility]atology managing    Stage 3a chronic kidney disease    Systolic murmur 02/23/2019    TBI (traumatic brain injury) 05/04/2020    Past Surgical History:  Procedure Laterality Date   ABDOMINAL HYSTERECTOMY  1972   cancer          1 tube 1 ovary   APPENDECTOMY     BACK SURGERY  11/28/2020   BREAST LUMPECTOMY Left 2010   colon polyp removal     EYE SURGERY     HAND SURGERY     KYPHOPLASTY N/A 11/28/2020   Procedure: KYPHOPLASTY THORACIC TWELVE AND LUMBAR ONE;  Surgeon: Venita Lick, MD;  Location: [facility] OR;  Service: Orthopedics;  Laterality: N/A;  90 mins   ortho procedures     multiple   OVARIAN CYST REMOVAL  1978   SHOULDER SURGERY  1954   left    Social History:   reports that she quit smoking about 36 years ago. Her smoking use included cigarettes. She has never used smokeless tobacco. She reports current alcohol use. She reports that she does not use drugs.  Allergies  Allergen Reactions   Daypro [Oxaprozin] Swelling    Face and tongue   Prolia [Denosumab]     unknown    Family History  Problem Relation Age of Onset   Heart disease Father    Cancer Sister        lung, kidney   Cancer Brother        brain   Cancer Paternal Aunt        breast   Breast cancer Paternal Aunt    Cancer Brother        liver, lung   Osteoporosis Neg Hx      Prior to Admission medications   Medication Sig Start Date End Date Taking? Authorizing Provider  acetaminophen (TYLENOL) 500 MG tablet Take 2 tablets (1,000 mg total) by mouth 3 (three) times daily as needed for mild pain. 11/24/22   Hongalgi, Maximino Greenland, MD  amLODipine (NORVASC) 2.5 MG tablet Take 1 tablet (2.5 mg total) by mouth daily. 01/29/22   Copland, [name], MD  Calcium Carbonate-Vitamin D (CALCIUM + D PO) Take 1 capsule by mouth daily.     [provider]  cloNIDine (CATAPRES) 0.1 MG tablet Take 1 tablet (0.1 mg total) by mouth daily. Use if needed for vocal tic 12/03/22   Copland, [name], MD  latanoprost (XALATAN) 0.005 % ophthalmic solution Place 1 drop into both eyes at bedtime.     [provider]  levothyroxine (SYNTHROID) 75 MCG tablet TAKE 1 TABLET BY MOUTH DAILY BEFORE BREAKFAST 11/03/22   Copland, [name], MD  lidocaine (LIDODERM) 5 % Place 1 patch onto the skin daily. Remove & Discard patch within 12 hours or as directed by MD. Apply to left side chest wall at site of pain 11/24/22   Hongalgi, Maximino Greenland, MD  methocarbamol (ROBAXIN) 500 MG tablet Take 1 tablet (500 mg total) by mouth 3 (three) times daily as needed for muscle spasms. 11/24/22   Hongalgi, Maximino Greenland, MD  Multiple Vitamin (MULTIVITAMIN WITH MINERALS) TABS tablet Take 1 tablet by mouth  daily. One a day    [provider]  polyethylene glycol (MIRALAX / GLYCOLAX) 17 g packet Take 17 g by mouth daily as needed for mild constipation or moderate constipation. 11/24/22   Hongalgi, Maximino Greenland, MD  rosuvastatin (CRESTOR) 10 MG tablet Take 1 tablet (10 mg total) by mouth daily. TAKE 1 BY MOUTH DAILY 01/29/22   Copland, [name], MD  traMADol (ULTRAM) 50 MG tablet Take 1 tablet (50 mg total) by mouth every 8 (eight) hours as needed for moderate pain or severe pain. 11/24/22   [name], MD    Physical Exam: Vitals:   01/30/23 2000 01/30/23 2030 01/30/23 2100 01/30/23 2119  BP: (!) 116/92 117/69 107/60 102/73  Pulse: (!) 55 81 70 (!) 58  Resp: 16 16 (!) 21 (!) 25  Temp:      TempSrc:      SpO2: 90% 94% 97% 97%  Weight:         Constitutional: NAD, calm  Eyes: PERTLA, lids and conjunctivae normal ENMT: Mucous membranes are moist. Posterior pharynx clear of any exudate or lesions.   Neck: supple, no masses  Respiratory: no wheezing, no crackles. No accessory muscle use.  Cardiovascular: Rate ~80 and irregularly irregular. No extremity edema.   Abdomen: Soft, non-distended, no tenderness. Bowel sounds active.  Musculoskeletal: no clubbing / cyanosis. No joint deformity upper and lower extremities.   Skin: no significant rashes, lesions, ulcers. Warm, dry, well-perfused. Neurologic: CN 2-12 grossly  intact. Moving all extremities. Alert and oriented.  Psychiatric: Pleasant. Cooperative.    Labs and Imaging on Admission: I have personally reviewed following labs and imaging studies  CBC: Recent Labs  Lab 01/30/23 1725  WBC 36.7*  NEUTROABS 31.7*  HGB 11.9*  HCT 36.4  MCV 93.8  PLT 410*   Basic Metabolic Panel: Recent Labs  Lab 01/30/23 1725  NA 133*  K 4.0  CL 99  CO2 23  GLUCOSE 101*  BUN 15  CREATININE 0.91  CALCIUM 8.5*   GFR: Estimated Creatinine Clearance: 30 mL/min (by C-G formula based on SCr of 0.91 mg/dL). Liver Function Tests: Recent Labs  Lab 01/30/23 1725  AST 55*  ALT 68*  ALKPHOS 341*  BILITOT 0.4  PROT 7.0  ALBUMIN 2.7*   Recent Labs  Lab 01/30/23 1725  LIPASE 27   No results for input(s): "AMMONIA" in the last 168 hours. Coagulation Profile: No results for input(s): "INR", "PROTIME" in the last 168 hours. Cardiac Enzymes: No results for input(s): "CKTOTAL", "CKMB", "CKMBINDEX", "TROPONINI" in the last 168 hours. BNP (last 3 results) No results for input(s): "PROBNP" in the last 8760 hours. HbA1C: No results for input(s): "HGBA1C" in the last 72 hours. CBG: No results for input(s): "GLUCAP" in the last 168 hours. Lipid Profile: No results for input(s): "CHOL", "HDL", "LDLCALC", "TRIG", "CHOLHDL", "LDLDIRECT" in the last 72 hours. Thyroid Function Tests: No results for input(s): "TSH", "T4TOTAL", "FREET4", "T3FREE", "THYROIDAB" in the last 72 hours. Anemia Panel: No results for input(s): "VITAMINB12", "FOLATE", "FERRITIN", "TIBC", "IRON", "RETICCTPCT" in the last 72 hours. Urine analysis:    Component Value Date/Time   COLORURINE YELLOW 01/30/2023 1839   APPEARANCEUR CLEAR 01/30/2023 1839   LABSPEC <=1.005 01/30/2023 1839   LABSPEC 1.010 06/05/2010 1019   PHURINE 6.0 01/30/2023 1839   GLUCOSEU NEGATIVE 01/30/2023 1839   HGBUR NEGATIVE 01/30/2023 1839   BILIRUBINUR NEGATIVE 01/30/2023 1839   BILIRUBINUR negative 02/28/2016  0945   BILIRUBINUR Negative 06/05/2010 1019   KETONESUR NEGATIVE 01/30/2023  1839   PROTEINUR NEGATIVE 01/30/2023 1839   UROBILINOGEN 0.2 02/28/2016 0945   UROBILINOGEN 0.2 08/24/2014 1425   NITRITE NEGATIVE 01/30/2023 1839   LEUKOCYTESUR TRACE (A) 01/30/2023 1839   LEUKOCYTESUR Negative 06/05/2010 1019   Sepsis Labs: (procalcitonin:4,lacticidven:4) )No results found for this or any previous visit (from the past 240 hour(s)).   Radiological Exams on Admission: CT CHEST ABDOMEN PELVIS W CONTRAST  Result Date: 01/30/2023 CLINICAL DATA:  Sepsis. Diarrhea for 1 day. Recent hospitalization discharge 5 days ago. Bilateral feet edema. History of lung cancer EXAM: CT CHEST, ABDOMEN, AND PELVIS WITH CONTRAST TECHNIQUE: Multidetector CT imaging of the chest, abdomen and pelvis was performed following the standard protocol during bolus administration of intravenous contrast. RADIATION DOSE REDUCTION: This exam was performed according to the departmental dose-optimization program which includes automated exposure control, adjustment of the mA and/or kV according to patient size and/or use of iterative reconstruction technique. CONTRAST:  75mL OMNIPAQUE IOHEXOL 300 MG/ML  SOLN COMPARISON:  Chest radiographs earlier today and CT chest abdomen and pelvis 11/21/2022 FINDINGS: CT CHEST FINDINGS Cardiovascular: Normal heart size. Unchanged small-moderate pericardial effusion. No aortic aneurysm or dissection. No central pulmonary embolism. Coronary artery and aortic atherosclerotic calcification. Mediastinum/Nodes: Extensive pneumomediastinum. Unremarkable esophagus. No thoracic adenopathy. Lungs/Pleura: Right apical hydropneumothorax with air-fluid level as seen on radiographs earlier today. Additional small right basilar hydropneumothorax. Postsurgical changes from right lower lobectomy. New patchy ground-glass opacities in the posterior lower right lung. No left pleural effusion or pneumothorax.  Redemonstrated part solid nodule in the left lung apex measuring up to 2.1 cm in overall diameter with 12 mm solid component (4/19). Additional pulmonary nodules in the right lung are unchanged. For example 11 mm nodule in the lateral right upper lobe (4/53). Bilateral subpleural reticular and ground-glass opacities with architectural distortion and bronchiolectasis compatible with interstitial lung disease. Musculoskeletal: Extensive subcutaneous emphysema throughout the chest wall extending into the visualized portions of the bilateral neck and upper abdomen. The right anterolateral third rib is fractured in 2 places (4/48). Additional mildly displaced fracture of the lateral right fourth rib subacute or chronic left 6th-10th rib fractures. Severe compression deformity of T11, T12, and L1 with vertebroplasty changes in T12 and L1 are unchanged. CT ABDOMEN PELVIS FINDINGS Hepatobiliary: Hepatic cyst in the right hepatic dome. No acute abnormality. Pancreas: Unremarkable. Spleen: Unremarkable. Adrenals/Urinary Tract: Stable adrenal glands. Bilateral cortical renal scarring. No urinary calculi or hydronephrosis. Distended bladder. Stomach/Bowel: Diffuse wall thickening of the colon with mucosal hyperenhancement greatest about the ascending colon. Dilated loops of small bowel in the left hemiabdomen without definite transition point. There is mild wall thickening about the terminal ileum. Stomach is within normal limits. Vascular/Lymphatic: Aortic atherosclerotic calcification. No lymphadenopathy. Reproductive: Unremarkable. Other: No free intraperitoneal air. Trace free fluid in the right pericolic gutter. Musculoskeletal: No acute osseous abnormality. IMPRESSION: CHEST: 1. Postsurgical changes from right lower lobectomy. 2. Small right apical hydropneumothorax with air-fluid level as seen on radiographs earlier today. Additional small right basilar hydropneumothorax. 3. New patchy ground-glass opacities in the  posterior lower right lung, which could be infectious or inflammatory or due to contusion in the setting of trauma. 4. Extensive pneumomediastinum. Unchanged small-moderate pericardial effusion. 5. Acute fractures of the right anterolateral third and fourth ribs. 6. Extensive subcutaneous emphysema throughout the chest wall extending into the visualized portions of the bilateral neck and upper abdomen. 7. Redemonstrated part solid nodule in the left lung apex measuring up to 2.1 cm in overall diameter with 12 mm solid component.  Additional pulmonary nodules in the right lung are unchanged. Attention on follow-up. ABDOMEN/PELVIS: 1. Diffuse wall thickening of the colon with mucosal hyperenhancement greatest about the ascending colon, consistent with nonspecific pancolitis. 2. Dilated loops of small bowel in the left hemiabdomen without definite transition point. This may represent ileus however obstruction is difficult to exclude. Electronically Signed   By: Minerva Fester M.D.   On: 01/30/2023 19:57   DG Chest 2 View  Result Date: 01/30/2023 CLINICAL DATA:  Subcutaneous emphysema. Recent right lower lobe lobectomy EXAM: CHEST - 2 VIEW COMPARISON:  11/21/2022 FINDINGS: Mild cardiomegaly. Postsurgical changes from right lower lobectomy. Small right hydropneumothorax with small apical air component and small layering effusion. Patchy opacities within the right lung base. No left-sided pleural effusion or pneumothorax is evident. Extensive subcutaneous emphysema of the bilateral chest wall and bilateral neck as well as within the proximal left upper extremity. Multiple chronic compression deformity centered at the thoracolumbar junction. IMPRESSION: 1. Postsurgical changes to the chest with small right hydropneumothorax. No evidence of a tension component. 2. Patchy opacities within the right lung base, which may represent atelectasis versus pneumonia. 3. Extensive subcutaneous emphysema of the chest, neck, and  left upper extremity. These results were called by telephone at the time of interpretation on 01/30/2023 at 7:15 pm to provider JOSHUA LONG , who verbally acknowledged these results. Electronically Signed   By: Duanne Guess D.O.   On: 01/30/2023 19:15    EKG: Independently reviewed. Atrial fibrillation, PVCs, LAD.   Assessment/Plan   1. Pancolitis   - Check C diff and GI pathogen panel, continue IVF hydration, monitor volume status and electrolytes    2. Lung cancer  - S/p right lower lobectomy 01/20/23  - Continue outpatient follow-up as planned    3. Hydropneumothorax  - Small right apical and basilar hydropneumothoraces noted on CT in ED  - She is asymptomatic  - Repeat CXR tomorrow    4. Atrial fibrillation  - Developed during recent hospitalization - CHADS-VASc is 73 (age x2, gender, HTN, HLD) - She is not anticoagulated and prefers that we discuss anticoagulation with her daughters when they come tomorrow  - Continue metoprolol    5. Hypertension  - Continue Norvasc and metoprolol   6. CKD IIIa  - Appears to be at baseline  - Renally-dose medications and monitor   7. HLD - Continue Crestor   8. Hypothyroidism  - Continue Synthroid   9. Pericardial effusion   - Appears unchanged on CT in ED, no clinical suggestion of tamponade    DVT prophylaxis: sq heparin  Code Status: Full  Level of Care: Level of care: Progressive Family Communication: None present   Disposition Plan:  Patient is from: home  Anticipated d/c is to: Home  Anticipated d/c date is: Possibly as early as 01/31/23  Patient currently: Pending stool studies, tolerance of adequate oral intake  Consults called: None  Admission status: Observation     Briscoe Deutscher, MD [facility]  01/30/2023, 11:41 PM

## 2023-01-30 NOTE — Progress Notes (Signed)
Plan of Care Note for accepted transfer   Patient: [name] MRN: [id number]   DOA: 01/30/2023  Facility requesting transfer: [facility] ED Requesting Provider: Dr. Jacqulyn Bath, EDP Reason for transfer: Diarrhea, severe leukocytosis 36K with concern for possible C-diff pancolitis.  Facility course: The patient is a 89 year old female recently admitted at [facility] where she was newly diagnosed with lung cancer, she is status post lobectomy 01/20/23.  Initially had a chest tube placed which was removed.  Discharged 5 days ago from [place].  During that admission she had received Zosyn for 24 hrs due to initial concern for infection.  Endorses sudden onset multiple large-volume watery stools today.  Associated with mild epigastric tenderness.  She took Imodium x 2.  She presented to [facility] ED for further evaluation.  While in the ED, she was noted to have severe leukocytosis on lab work, 36.7 K.  CT abdomen pelvis with contrast revealed findings of pancolitis.  Chest x-ray showed postsurgical changes to the chest with small right hydropneumothorax.  No evidence of a tension component.  Patchy opacities within the right lung base which may represent atelectasis versus pneumonia.  Extensive subcutaneous emphysema of the chest, neck and left upper extremity.  No recurrent stools while in the ED.  Stool studies have been ordered by EDP and are pending.  Per EDP not in acute distress.  O2 saturation 97% on room air.  No chest pain or dyspnea.  The patient has a strong preference to not go back to [place].  EDP requested admission due to high suspicion for C. difficile pancolitis.  The patient was admitted at [facility] long hospital progressive care unit as observation status.  Plan of care: The patient is accepted for admission to Progressive unit, at [facility].   Author: Darlin Drop, DO 01/30/2023  Check [email or web address] for on-call coverage.  Nursing staff, Please call [facility] Admits & Consults System-Wide number on  Amion as soon as patient's arrival, so appropriate admitting provider can evaluate the pt.

## 2023-01-30 NOTE — ED Provider Notes (Signed)
Emergency Department Provider Note   I have reviewed the triage vital signs and the nursing notes.   HISTORY  Chief Complaint Diarrhea   HPI [name] is a 89 y.o. female with past history reviewed below including recent admission at [facility] for right lower lobe lung mass status post lobectomy on 01/20/23 presents emergency department with multiple episodes of nonbloody diarrhea at home today.  She states she is been recovering well since discharge from the hospital.  She did receive a brief course of Zosyn while in the hospital but this was stopped after 24 hours after infection/pneumonia was ruled out.  The pigtail catheter, placed during surgery, was removed and she has been home recovering since that time.  She had 2 episodes of large-volume, watery diarrhea today without fevers or abdominal pain.  She denies any chest pain or shortness of breath.  She took Imodium twice today after coordinating with her team at [facility] and diarrhea has stopped.  She does have some fatigue which remains. She is not on chemotherapy currently.    Past Medical History:  Diagnosis Date   Acute blood loss anemia    AKI (acute kidney injury)    Autoimmune disease    Back pain 05/04/2020   Barrett's esophagus    Basal cell carcinoma (BCC) in situ of skin 04/04/2020   Path 03/2020, [facility]    Bone lesion    sacrum   Breast cancer 2010   cT1 N0 M0; ER+/ PR+/ HER2 neg; s/p lumpectomy 07/2010; started adjuvant hormonal therapy 10/2009   Cellulitis of skin 12/31/2020   Cervical cancer    Chronic midline thoracic back pain 12/31/2020   Diarrhea 09/08/2017   Dizziness 02/22/2019   Dysphagia    Elevated serum creatinine 09/08/2017   Esophageal spasm 09/20/2013   Essential hypertension 03/21/2019   Fall at home, initial encounter 04/29/2020   Fracture, shoulder 03/06/2012   Pt seen at [facility] by Dr. Bradly Bienenstock on 03/09/2012 and 03/11/2012.   X-rays taken of left wrist, left elbow, and left  shoulder.   Shoulder Fx: nondisplaced proximal humerus fracture involving the greater tuberosity. Wrist: consistent with an old distal radius fracture with shortening present.       Glaucoma    Hoarseness of voice 02/28/2016   Humerus fracture 03/2012   impacted left humueral neck fracture, treated by Dr. Amanda Pea   Hx of meningioma of the brain 08/28/2017   Stable since 2010- 2018 on MRI   Hypercalcemia    Hyperlipidemia    Hypertension    Hypothyroidism    Intracranial hemorrhage    Nausea vomiting and diarrhea 09/08/2017   Orbital fracture, closed, initial encounter 04/29/2020   Osteoporosis 03/18/2012   Paroxysmal SVT (supraventricular tachycardia) 12/23/2020   Pneumonia    PVC (premature ventricular contraction) 09/08/2017   Raynaud's phenomenon 02/28/2016   SAH (subarachnoid hemorrhage) 04/29/2020   Squamous cell carcinoma in situ of skin 11/30/2018   Path 11/1008, [facility] managing    Stage 3a chronic kidney disease    Systolic murmur 02/23/2019   TBI (traumatic brain injury) 05/04/2020    Review of Systems  Constitutional: No fever/chills. Positive fatigue.  Cardiovascular: Denies chest pain. Respiratory: Denies shortness of breath. Gastrointestinal: No abdominal pain.  No nausea, no vomiting. Positive diarrhea.  No constipation. Genitourinary: Negative for dysuria. Musculoskeletal: Negative for back pain. Skin: Negative for rash. Neurological: Negative for headaches, focal weakness or numbness.   ____________________________________________   PHYSICAL EXAM:  VITAL SIGNS: ED Triage  Vitals  Enc Vitals Group     BP 01/30/23 1524 138/81     Pulse Rate 01/30/23 1524 79     Resp 01/30/23 1524 (!) 28     Temp 01/30/23 1524 97.7 F (36.5 C)     Temp Source 01/30/23 1524 Oral     SpO2 01/30/23 1524 100 %     Weight 01/30/23 1524 100 lb (45.4 kg)   Constitutional: Alert and oriented. Well appearing and in no acute distress. Eyes: Conjunctivae are normal.  Head:  Atraumatic. Nose: No congestion/rhinnorhea. Mouth/Throat: Mucous membranes are slightly dry.   Neck: No stridor.   Cardiovascular: Normal rate, regular rhythm. Good peripheral circulation. Grossly normal heart sounds.   Respiratory: Normal respiratory effort.  No retractions. Lungs CTAB. Gastrointestinal: Soft with mild epigastric tenderness. No peritonitis. No distention.  Musculoskeletal: No lower extremity tenderness nor edema. No gross deformities of extremities. Neurologic:  Normal speech and language. No gross focal neurologic deficits are appreciated.  Skin:  Skin is warm, dry and intact. No rash noted.  ____________________________________________   LABS (all labs ordered are listed, but only abnormal results are displayed)  Labs Reviewed  COMPREHENSIVE METABOLIC PANEL - Abnormal; Notable for the following components:      Result Value   Sodium 133 (*)    Glucose, Bld 101 (*)    Calcium 8.5 (*)    Albumin 2.7 (*)    AST 55 (*)    ALT 68 (*)    Alkaline Phosphatase 341 (*)    All other components within normal limits  URINALYSIS, ROUTINE W REFLEX MICROSCOPIC - Abnormal; Notable for the following components:   Leukocytes,Ua TRACE (*)    All other components within normal limits  CBC WITH DIFFERENTIAL/PLATELET - Abnormal; Notable for the following components:   WBC 36.7 (*)    Hemoglobin 11.9 (*)    RDW 15.8 (*)    Platelets 410 (*)    Neutro Abs 31.7 (*)    Monocytes Absolute 1.7 (*)    Abs Immature Granulocytes 1.15 (*)    All other components within normal limits  URINALYSIS, MICROSCOPIC (REFLEX) - Abnormal; Notable for the following components:   Bacteria, UA RARE (*)    All other components within normal limits  C DIFFICILE QUICK SCREEN W PCR REFLEX    GASTROINTESTINAL PANEL BY PCR, STOOL (REPLACES STOOL CULTURE)  LIPASE, BLOOD  LACTIC ACID, PLASMA  COMPREHENSIVE METABOLIC PANEL  MAGNESIUM  CBC   ____________________________________________  EKG   EKG  Interpretation  Date/Time:  Friday January 30 2023 21:01:38 EDT Ventricular Rate:  124 PR Interval:    QRS Duration: 82 QT Interval:  316 QTC Calculation: 400 R Axis:   -70 Text Interpretation: Atrial fibrillation Paired ventricular premature complexes LAD, consider left anterior fascicular block Borderline low voltage, extremity leads Confirmed by Alona Bene [phone]) on 01/30/2023 11:45:10 PM      Patient had been in NSR on the monitor. Known history of PAF. EKG taken at time of rhythm change. No Symptoms.   ____________________________________________  RADIOLOGY  CT CHEST ABDOMEN PELVIS W CONTRAST  Result Date: 01/30/2023 CLINICAL DATA:  Sepsis. Diarrhea for 1 day. Recent hospitalization discharge 5 days ago. Bilateral feet edema. History of lung cancer EXAM: CT CHEST, ABDOMEN, AND PELVIS WITH CONTRAST TECHNIQUE: Multidetector CT imaging of the chest, abdomen and pelvis was performed following the standard protocol during bolus administration of intravenous contrast. RADIATION DOSE REDUCTION: This exam was performed according to the departmental dose-optimization program which includes automated  exposure control, adjustment of the mA and/or kV according to patient size and/or use of iterative reconstruction technique. CONTRAST:  75mL OMNIPAQUE IOHEXOL 300 MG/ML  SOLN COMPARISON:  Chest radiographs earlier today and CT chest abdomen and pelvis 11/21/2022 FINDINGS: CT CHEST FINDINGS Cardiovascular: Normal heart size. Unchanged small-moderate pericardial effusion. No aortic aneurysm or dissection. No central pulmonary embolism. Coronary artery and aortic atherosclerotic calcification. Mediastinum/Nodes: Extensive pneumomediastinum. Unremarkable esophagus. No thoracic adenopathy. Lungs/Pleura: Right apical hydropneumothorax with air-fluid level as seen on radiographs earlier today. Additional small right basilar hydropneumothorax. Postsurgical changes from right lower lobectomy. New patchy  ground-glass opacities in the posterior lower right lung. No left pleural effusion or pneumothorax. Redemonstrated part solid nodule in the left lung apex measuring up to 2.1 cm in overall diameter with 12 mm solid component (4/19). Additional pulmonary nodules in the right lung are unchanged. For example 11 mm nodule in the lateral right upper lobe (4/53). Bilateral subpleural reticular and ground-glass opacities with architectural distortion and bronchiolectasis compatible with interstitial lung disease. Musculoskeletal: Extensive subcutaneous emphysema throughout the chest wall extending into the visualized portions of the bilateral neck and upper abdomen. The right anterolateral third rib is fractured in 2 places (4/48). Additional mildly displaced fracture of the lateral right fourth rib subacute or chronic left 6th-10th rib fractures. Severe compression deformity of T11, T12, and L1 with vertebroplasty changes in T12 and L1 are unchanged. CT ABDOMEN PELVIS FINDINGS Hepatobiliary: Hepatic cyst in the right hepatic dome. No acute abnormality. Pancreas: Unremarkable. Spleen: Unremarkable. Adrenals/Urinary Tract: Stable adrenal glands. Bilateral cortical renal scarring. No urinary calculi or hydronephrosis. Distended bladder. Stomach/Bowel: Diffuse wall thickening of the colon with mucosal hyperenhancement greatest about the ascending colon. Dilated loops of small bowel in the left hemiabdomen without definite transition point. There is mild wall thickening about the terminal ileum. Stomach is within normal limits. Vascular/Lymphatic: Aortic atherosclerotic calcification. No lymphadenopathy. Reproductive: Unremarkable. Other: No free intraperitoneal air. Trace free fluid in the right pericolic gutter. Musculoskeletal: No acute osseous abnormality. IMPRESSION: CHEST: 1. Postsurgical changes from right lower lobectomy. 2. Small right apical hydropneumothorax with air-fluid level as seen on radiographs earlier today.  Additional small right basilar hydropneumothorax. 3. New patchy ground-glass opacities in the posterior lower right lung, which could be infectious or inflammatory or due to contusion in the setting of trauma. 4. Extensive pneumomediastinum. Unchanged small-moderate pericardial effusion. 5. Acute fractures of the right anterolateral third and fourth ribs. 6. Extensive subcutaneous emphysema throughout the chest wall extending into the visualized portions of the bilateral neck and upper abdomen. 7. Redemonstrated part solid nodule in the left lung apex measuring up to 2.1 cm in overall diameter with 12 mm solid component. Additional pulmonary nodules in the right lung are unchanged. Attention on follow-up. ABDOMEN/PELVIS: 1. Diffuse wall thickening of the colon with mucosal hyperenhancement greatest about the ascending colon, consistent with nonspecific pancolitis. 2. Dilated loops of small bowel in the left hemiabdomen without definite transition point. This may represent ileus however obstruction is difficult to exclude. Electronically Signed   By: Minerva Fester M.D.   On: 01/30/2023 19:57   DG Chest 2 View  Result Date: 01/30/2023 CLINICAL DATA:  Subcutaneous emphysema. Recent right lower lobe lobectomy EXAM: CHEST - 2 VIEW COMPARISON:  11/21/2022 FINDINGS: Mild cardiomegaly. Postsurgical changes from right lower lobectomy. Small right hydropneumothorax with small apical air component and small layering effusion. Patchy opacities within the right lung base. No left-sided pleural effusion or pneumothorax is evident. Extensive subcutaneous emphysema of the bilateral  chest wall and bilateral neck as well as within the proximal left upper extremity. Multiple chronic compression deformity centered at the thoracolumbar junction. IMPRESSION: 1. Postsurgical changes to the chest with small right hydropneumothorax. No evidence of a tension component. 2. Patchy opacities within the right lung base, which may represent  atelectasis versus pneumonia. 3. Extensive subcutaneous emphysema of the chest, neck, and left upper extremity. These results were called by telephone at the time of interpretation on 01/30/2023 at 7:15 pm to provider Lattie Riege , who verbally acknowledged these results. Electronically Signed   By: Duanne Guess D.O.   On: 01/30/2023 19:15    ____________________________________________   PROCEDURES  Procedure(s) performed:   Procedures  CRITICAL CARE Performed by: Maia Plan Total critical care time: 35 minutes Critical care time was exclusive of separately billable procedures and treating other patients. Critical care was necessary to treat or prevent imminent or life-threatening deterioration. Critical care was time spent personally by me on the following activities: development of treatment plan with patient and/or surrogate as well as nursing, discussions with consultants, evaluation of patient's response to treatment, examination of patient, obtaining history from patient or surrogate, ordering and performing treatments and interventions, ordering and review of laboratory studies, ordering and review of radiographic studies, pulse oximetry and re-evaluation of patient's condition.  Alona Bene, MD Emergency Medicine  ____________________________________________   INITIAL IMPRESSION / ASSESSMENT AND PLAN / ED COURSE  Pertinent labs & imaging results that were available during my care of the patient were reviewed by me and considered in my medical decision making (see chart for details).   This patient is Presenting for Evaluation of abdominal pain, which does require a range of treatment options, and is a complaint that involves a high risk of morbidity and mortality.  The Differential Diagnoses  includes but is not exclusive to acute cholecystitis, intrathoracic causes for epigastric abdominal pain, gastritis, duodenitis, pancreatitis, small bowel or large bowel obstruction,  abdominal aortic aneurysm, hernia, gastritis, etc.   Critical Interventions-    Medications  amLODipine (NORVASC) tablet 2.5 mg (has no administration in time range)  rosuvastatin (CRESTOR) tablet 10 mg (has no administration in time range)  levothyroxine (SYNTHROID) tablet 75 mcg (has no administration in time range)  methocarbamol (ROBAXIN) tablet 500 mg (has no administration in time range)  heparin injection 5,000 Units (has no administration in time range)  sodium chloride flush (NS) 0.9 % injection 3 mL (has no administration in time range)  acetaminophen (TYLENOL) tablet 650 mg (has no administration in time range)    Or  acetaminophen (TYLENOL) suppository 650 mg (has no administration in time range)  0.9 %  sodium chloride infusion (has no administration in time range)  oxyCODONE (Oxy IR/ROXICODONE) immediate release tablet 5 mg (has no administration in time range)  fentaNYL (SUBLIMAZE) injection 25 mcg (has no administration in time range)  ondansetron (ZOFRAN) tablet 4 mg (has no administration in time range)    Or  ondansetron (ZOFRAN) injection 4 mg (has no administration in time range)  sodium chloride 0.9 % bolus 500 mL (0 mLs Intravenous Stopped 01/30/23 1955)  iohexol (OMNIPAQUE) 300 MG/ML solution 75 mL (75 mLs Intravenous Contrast Given 01/30/23 1856)  metoprolol tartrate (LOPRESSOR) tablet 25 mg (25 mg Oral Given 01/30/23 2122)    Reassessment after intervention:  symptoms improved. No diarrhea in the ED.    I did obtain Additional Historical Information from family at bedside.   I decided to review pertinent External Data, and  in summary discharge summary from [facility] reviewed.   Clinical Laboratory Tests Ordered, included patient with leukocytosis to 36.7.  Mild anemia at 11.9.  LFTs also mildly elevated with normal bilirubin.  Lipase normal.  Lactic acid normal.  Radiologic Tests Ordered, included CXR and CT chest, abd, pelvis. I independently interpreted the  images and agree with radiology interpretation.   Cardiac Monitor Tracing which shows NSR.    Social Determinants of Health Risk lives at home with husband and family help.   Consult complete with [facility]. Dr. Margo Aye. Plan for admit for pancolitis. Leukocytosis but will try to get stool sample for C diff testing prior to abx.   Medical Decision Making: Summary:  Patient presents emergency department with diarrhea and fatigue primarily.  No fever but found to have significant leukocytosis here to 36.7.  She is not on chemotherapy currently and recently had surgery of her right lung.  The chest tube site is well-appearing without surrounding infection or drainage.  Lungs are equal and no respiratory symptoms or chest pain.  Plan for C. difficile as she was briefly on Zosyn although antibiotic was discontinued within 24 hours.   Reevaluation with update and discussion with patient and family. They prefer to stay at [facility] if possible. No clear complication for surgery.   Patient's presentation is most consistent with acute presentation with potential threat to life or bodily function.   Disposition: admit ____________________________________________  FINAL CLINICAL IMPRESSION(S) / ED DIAGNOSES  Final diagnoses:  Pancolitis  Dehydration    Note:  This document was prepared using Dragon voice recognition software and may include unintentional dictation errors.  Alona Bene, MD, [facility] Emergency Medicine    Jaquavious Mercer, Arlyss Repress, MD 01/30/23 [phone]

## 2023-01-30 NOTE — ED Triage Notes (Signed)
Diarrhea x 1 day , recent hospitalization , Dc 5 days ago .  Adds bilateral feet edema  Denies shortness of breath or chest pain .  Hx lung cancer

## 2023-01-30 NOTE — ED Notes (Signed)
Pt had steady gait while walking to the bathroom with a stand by assist.

## 2023-01-31 ENCOUNTER — Observation Stay (HOSPITAL_COMMUNITY): Payer: Medicare Other

## 2023-01-31 DIAGNOSIS — J439 Emphysema, unspecified: Secondary | ICD-10-CM | POA: Diagnosis not present

## 2023-01-31 DIAGNOSIS — K5100 Ulcerative (chronic) pancolitis without complications: Secondary | ICD-10-CM | POA: Diagnosis not present

## 2023-01-31 DIAGNOSIS — Z87891 Personal history of nicotine dependence: Secondary | ICD-10-CM | POA: Diagnosis not present

## 2023-01-31 DIAGNOSIS — Z853 Personal history of malignant neoplasm of breast: Secondary | ICD-10-CM | POA: Diagnosis not present

## 2023-01-31 DIAGNOSIS — J90 Pleural effusion, not elsewhere classified: Secondary | ICD-10-CM | POA: Diagnosis not present

## 2023-01-31 DIAGNOSIS — R197 Diarrhea, unspecified: Secondary | ICD-10-CM | POA: Diagnosis not present

## 2023-01-31 DIAGNOSIS — J948 Other specified pleural conditions: Secondary | ICD-10-CM | POA: Diagnosis not present

## 2023-01-31 DIAGNOSIS — Z8541 Personal history of malignant neoplasm of cervix uteri: Secondary | ICD-10-CM | POA: Diagnosis not present

## 2023-01-31 DIAGNOSIS — J939 Pneumothorax, unspecified: Secondary | ICD-10-CM | POA: Diagnosis not present

## 2023-01-31 DIAGNOSIS — I10 Essential (primary) hypertension: Secondary | ICD-10-CM | POA: Diagnosis not present

## 2023-01-31 DIAGNOSIS — I517 Cardiomegaly: Secondary | ICD-10-CM | POA: Diagnosis not present

## 2023-01-31 DIAGNOSIS — I129 Hypertensive chronic kidney disease with stage 1 through stage 4 chronic kidney disease, or unspecified chronic kidney disease: Secondary | ICD-10-CM | POA: Diagnosis not present

## 2023-01-31 DIAGNOSIS — F419 Anxiety disorder, unspecified: Secondary | ICD-10-CM | POA: Diagnosis not present

## 2023-01-31 DIAGNOSIS — A0472 Enterocolitis due to Clostridium difficile, not specified as recurrent: Secondary | ICD-10-CM

## 2023-01-31 DIAGNOSIS — N1831 Chronic kidney disease, stage 3a: Secondary | ICD-10-CM | POA: Diagnosis not present

## 2023-01-31 DIAGNOSIS — Z79899 Other long term (current) drug therapy: Secondary | ICD-10-CM | POA: Diagnosis not present

## 2023-01-31 DIAGNOSIS — I2510 Atherosclerotic heart disease of native coronary artery without angina pectoris: Secondary | ICD-10-CM | POA: Diagnosis not present

## 2023-01-31 DIAGNOSIS — R140 Abdominal distension (gaseous): Secondary | ICD-10-CM | POA: Diagnosis not present

## 2023-01-31 DIAGNOSIS — Z902 Acquired absence of lung [part of]: Secondary | ICD-10-CM | POA: Diagnosis not present

## 2023-01-31 DIAGNOSIS — E039 Hypothyroidism, unspecified: Secondary | ICD-10-CM | POA: Diagnosis not present

## 2023-01-31 DIAGNOSIS — Z85828 Personal history of other malignant neoplasm of skin: Secondary | ICD-10-CM | POA: Diagnosis not present

## 2023-01-31 DIAGNOSIS — E785 Hyperlipidemia, unspecified: Secondary | ICD-10-CM | POA: Diagnosis not present

## 2023-01-31 DIAGNOSIS — C3431 Malignant neoplasm of lower lobe, right bronchus or lung: Secondary | ICD-10-CM | POA: Diagnosis not present

## 2023-01-31 DIAGNOSIS — G8918 Other acute postprocedural pain: Secondary | ICD-10-CM | POA: Diagnosis not present

## 2023-01-31 DIAGNOSIS — T797XXA Traumatic subcutaneous emphysema, initial encounter: Secondary | ICD-10-CM

## 2023-01-31 DIAGNOSIS — I480 Paroxysmal atrial fibrillation: Secondary | ICD-10-CM | POA: Diagnosis not present

## 2023-01-31 DIAGNOSIS — Z90710 Acquired absence of both cervix and uterus: Secondary | ICD-10-CM | POA: Diagnosis not present

## 2023-01-31 DIAGNOSIS — J9811 Atelectasis: Secondary | ICD-10-CM | POA: Diagnosis not present

## 2023-01-31 LAB — COMPREHENSIVE METABOLIC PANEL
ALT: 44 U/L (ref 0–44)
AST: 28 U/L (ref 15–41)
Albumin: 2.3 g/dL — ABNORMAL LOW (ref 3.5–5.0)
Alkaline Phosphatase: 249 U/L — ABNORMAL HIGH (ref 38–126)
Anion gap: 10 (ref 5–15)
BUN: 12 mg/dL (ref 8–23)
CO2: 22 mmol/L (ref 22–32)
Calcium: 8.2 mg/dL — ABNORMAL LOW (ref 8.9–10.3)
Chloride: 103 mmol/L (ref 98–111)
Creatinine, Ser: 0.95 mg/dL (ref 0.44–1.00)
GFR, Estimated: 57 mL/min — ABNORMAL LOW (ref >60)
Glucose, Bld: 88 mg/dL (ref 70–99)
Potassium: 4 mmol/L (ref 3.5–5.1)
Sodium: 135 mmol/L (ref 135–145)
Total Bilirubin: 0.7 mg/dL (ref 0.3–1.2)
Total Protein: 5.6 g/dL — ABNORMAL LOW (ref 6.5–8.1)

## 2023-01-31 LAB — CBC
HCT: 34.7 % — ABNORMAL LOW (ref 36.0–46.0)
Hemoglobin: 11.2 g/dL — ABNORMAL LOW (ref 12.0–15.0)
MCH: 30.9 pg (ref 26.0–34.0)
MCHC: 32.3 g/dL (ref 30.0–36.0)
MCV: 95.6 fL (ref 80.0–100.0)
Platelets: 333 10*3/uL (ref 150–400)
RBC: 3.63 MIL/uL — ABNORMAL LOW (ref 3.87–5.11)
RDW: 15.7 % — ABNORMAL HIGH (ref 11.5–15.5)
WBC: 34.9 10*3/uL — ABNORMAL HIGH (ref 4.0–10.5)
nRBC: 0 % (ref 0.0–0.2)

## 2023-01-31 LAB — C DIFFICILE QUICK SCREEN W PCR REFLEX
C Diff antigen: POSITIVE — AB
C Diff interpretation: DETECTED
C Diff toxin: POSITIVE — AB

## 2023-01-31 LAB — MAGNESIUM: Magnesium: 1.9 mg/dL (ref 1.7–2.4)

## 2023-01-31 MED ORDER — ONDANSETRON HCL 4 MG PO TABS: 4.0000 mg | ORAL_TABLET | Freq: Four times a day (QID) | ORAL | 0 refills | Status: DC | PRN

## 2023-01-31 MED ORDER — OXYCODONE HCL 5 MG PO TABS: 5.0000 mg | ORAL_TABLET | ORAL | 0 refills | Status: DC | PRN

## 2023-01-31 MED ORDER — METRONIDAZOLE 500 MG/100ML IV SOLN: 500.0000 mg | Freq: Three times a day (TID) | INTRAVENOUS | Status: DC

## 2023-01-31 MED ORDER — METRONIDAZOLE 500 MG/100ML IV SOLN
500.0000 mg | Freq: Three times a day (TID) | INTRAVENOUS | Status: DC
  Administered 2023-01-31: 500 mg via INTRAVENOUS
  Filled 2023-01-31: qty 100

## 2023-01-31 MED ORDER — METOPROLOL TARTRATE 25 MG PO TABS: 25.0000 mg | ORAL_TABLET | Freq: Two times a day (BID) | ORAL | Status: DC

## 2023-01-31 MED ORDER — VANCOMYCIN HCL 250 MG PO CAPS: 500.0000 mg | ORAL_CAPSULE | Freq: Four times a day (QID) | ORAL | Status: DC

## 2023-01-31 MED ORDER — VANCOMYCIN HCL 250 MG PO CAPS
500.0000 mg | ORAL_CAPSULE | Freq: Four times a day (QID) | ORAL | Status: DC
  Administered 2023-01-31: 500 mg via ORAL
  Filled 2023-01-31 (×2): qty 2

## 2023-01-31 NOTE — Progress Notes (Signed)
Patient became angry when RN asked financial admission questions regarding,if housing and food needs were met. Pt. Asked NT  if "she looked homeless?" She expressed she did not like ice in her water to take her meds. RN asked if it was okay to pour out ice to take meds, and patient agreed. However later she commented that she was not happy about that. Pt expressed that staff was not able to come in her room when she called and had an urgent stool. NT went to patient shortly after she called and assisted with cleaning her up.  Writer was in another room but did receive the call and checked on pt. Promptly. On a separate call to staff, the writer went to assist and the patient had fallen asleep and said she was clean, dry and cannot remember why she called. Patient says why do we continue to ask her why she is here and who she is. Writer explained this is to monitor her mentation status. Will continue to monitor patient.

## 2023-01-31 NOTE — Discharge Summary (Signed)
Physician Discharge Summary   Patient: [name] MRN: [id number] DOB: [birth year]  Admit date:     01/30/2023  Discharge date: 01/31/23  Discharge Physician: Marguerita Merles, DO   PCP: Pearline Cables, MD   Recommendations at discharge:   Further care per [facility] Dr. Glenna Durand  Discharge Diagnoses: Principal Problem:   Pancolitis Active Problems:   Essential hypertension   Hypothyroidism   Hyperlipidemia   Stage 3a chronic kidney disease   Atrial fibrillation   Hydropneumothorax   Adenocarcinoma of lower lobe of right lung  Resolved Problems:   * No resolved hospital problems. [facility] Course: HPI per Dr. Marcial Pacas Opyd on 01/30/23: [name] is a 89 y.o. female with medical history significant for hypertension, hyperlipidemia, hypothyroidism, CKD 3A, breast cancer, adenocarcinoma status post right lower lobectomy on 01/20/2023, and paroxysmal atrial fibrillation not anticoagulated who presents to the emergency department with diarrhea.   Patient reports that she had intermittent diarrhea while hospitalized at [facility] earlier this month but had been doing well since her discharge a few days ago until this morning when she had large-volume watery diarrhea.  She went on to have another episode shortly after, and then additional episodes where she was unable to make it to the bathroom.  She denies fevers, abdominal pain, nausea, or vomiting associated with this.  She denies history of C. difficile colitis.  She also denies chest pain, shortness of breath, or cough.   Patient took Imodium at home and has not had any diarrhea since then.     [facility] ED Course: Upon arrival to the ED, patient is found to be afebrile and saturating well on room air with stable blood pressure.  EKG demonstrates atrial fibrillation with rate 124.  CT of the chest, abdomen, and pelvis is most notable for diffuse wall thickening of the colon with mucosal splint, dilated loops of small bowel without definite  transition point, small right apical and small right basilar hydropneumothorax, extensive pneumomediastinum, unchanged small to moderate pericardial effusion, and patchy groundglass opacities in the posterior right lower lung.  Labs most notable for WBC 36,700, platelets 410,000, alkaline phosphatase 341, albumin 2.7, AST 55, and ALT 68.   Patient was given 500 mL of normal saline and 25 mg oral Lopressor in the ED.  She was transferred to [facility] for admission.  **Interim History Further workup revealed that she had a pancolitis and the pain questionably C. difficile so she is initiated on vancomycin oral and IV Flagyl.  Patient's heart rate was still elevated and a fibrillation with RVR.  And she had worsening dyspnea on exertion.  Family requested transfer to [facility] and received a call by her primary surgical oncologist Dr. Glenna Durand.  Dr. Glenna Durand has accepted the patient given that the patient has postsurgical changes and findings on her chest CT scan as well as new onset C. difficile after being given antibiotics with IV Zosyn.  She is stable for transfer to [facility] to the care of Dr. Glenna Durand and will have further care and treatment at [facility] as she has a bed and is to be transferred later today.  Assessment and Plan:  C Difficile Pancolitis   -Check C diff  and was Positive for Ag and Toxin -CT Scan of the Abd/Pelvis done and showed " Diffuse wall thickening of the colon with mucosal hyperenhancement greatest about the ascending colon, consistent with nonspecific pancolitis. Dilated loops of small bowel in the left hemiabdomen without definite  transition point. This may represent ileus however obstruction is difficult to exclude." -GI pathogen panel pending,  -WBC Trend: Recent Labs  Lab 01/30/23 1725 01/31/23 0522  WBC 36.7* 34.9*  -Continue IVF hydration with NS at 100 mL/hr -Continue to monitor volume status and electrolytes   -Start C Diff Treatment with po Vancomycin  and IV Flagyl   Lung Cancer s/p RLL Lobectomy  Pneumomediastinum Right Anterolateral Rib Fx's Subcutaneous Emphysema Left Apical and Pulmonary Nodules -S/p right lower lobectomy 01/20/23  -CT Scan done and showed "Postsurgical changes from right lower lobectomy. Small right apical hydropneumothorax with air-fluid level as seen on radiographs earlier today. Additional small right basilar hydropneumothorax. New patchy ground-glass opacities in the posterior lower right lung, which could be infectious or inflammatory or due to contusion in the setting of trauma. Extensive pneumomediastinum. Unchanged small-moderate pericardial effusion. Acute fractures of the right anterolateral third and fourth ribs. Extensive subcutaneous emphysema throughout the chest wall extending into the visualized portions of the bilateral neck and upper abdomen. Redemonstrated part solid nodule in the left lung apex measuring up to 2.1 cm in overall diameter with 12 mm solid component. Additional pulmonary nodules in the right lung are unchanged. Attention on follow-up." -Patient being transferred to [facility] for further evaluation and management by Dr. Glenna Durand    Hydropneumothorax  -Small right apical and basilar hydropneumothoraces noted on CT in ED  -She is asymptomatic at rest but dyspenic on exertion -Repeat CXR today showed "Stable mild bibasilar atelectasis and small bilateral pleural effusions. Right pneumothorax seen on recent chest CT is not well visualized on this exam."   Atrial Fibrillation with RVR -Developed during recent hospitalization -CHADS-VASc is 73 (age x2, gender, HTN, HLD) -She is not anticoagulated and prefers that we discuss anticoagulation with her daughters but will defer to Cardiothoracic Surgery given recent Surgical intervention  -Continue Metoprolol Tartrate 25 mg po BID -Continue to Monitor on Telemetry  -Consider Cardizem gtt but will need repeat ECHO and can be done at [facility]     Hypertension  -Continue Amlodipine 2.5 mg po Daily and Metoprolol 25 mg po BID    CKD IIIa  -Appears to be at baseline  -BUN/Cr Trend: Recent Labs  Lab 01/30/23 1725 01/31/23 0522  BUN 15 12  CREATININE 0.91 0.95  -Avoid Nephrotoxic Medications, Contrast Dyes, Hypotension and Dehydration to Ensure Adequate Renal Perfusion and will need to Renally Adjust Meds -Continue to Monitor and Trend Renal Function carefully and repeat CMP in the AM    HLD -Continue Rosuvastatin 10 mg po Daily     Hypothyroidism  -Continue Levothyroxine 75 mcg po Daily -Check TSH in the AM    Normocytic Anemia/Anemia of Chronic Disease -Hgb/Hct Trend: Recent Labs  Lab 01/30/23 1725 01/31/23 0522  HGB 11.9* 11.2*  HCT 36.4 34.7*  MCV 93.8 95.6  -Check Anemia Panel in the AM -Continue to Monitor for S/Sx of Bleeding; No overt bleeding noted  -Repeat CBC in the AM    Pericardial Effusion   -Appears unchanged on CT in ED, no clinical suggestion of tamponade  -May benefit from ECHOcardiogram   Hypoalbuminemia -Patient's Albumin Trend: Recent Labs  Lab 01/30/23 1725 01/31/23 0522  ALBUMIN 2.7* 2.3*  -Continue to Monitor and Trend and repeat CMP in the AM   Consultants: None Procedures performed: As delineated as above  Disposition:  Transfer to [facility] to the Care of Dr. Arbie Cookey  Diet recommendation:  Regular Diet  DISCHARGE MEDICATION: Allergies as of 01/31/2023  Reactions   Daypro [oxaprozin] Swelling   Face and tongue   Prolia [denosumab]    unknown        Medication List     STOP taking these medications    lidocaine 5 % Commonly known as: LIDODERM   methocarbamol 500 MG tablet Commonly known as: ROBAXIN   polyethylene glycol 17 g packet Commonly known as: MIRALAX / GLYCOLAX   traMADol 50 MG tablet Commonly known as: ULTRAM       TAKE these medications    acetaminophen 500 MG tablet Commonly known as: TYLENOL Take 2 tablets (1,000 mg total)  by mouth 3 (three) times daily as needed for mild pain.   amLODipine 2.5 MG tablet Commonly known as: NORVASC Take 1 tablet (2.5 mg total) by mouth daily.   CALCIUM + D PO Take 1 capsule by mouth daily.   cloNIDine 0.1 MG tablet Commonly known as: CATAPRES Take 1 tablet (0.1 mg total) by mouth daily. Use if needed for vocal tic   latanoprost 0.005 % ophthalmic solution Commonly known as: XALATAN Place 1 drop into both eyes at bedtime.   levothyroxine 75 MCG tablet Commonly known as: SYNTHROID TAKE 1 TABLET BY MOUTH DAILY BEFORE BREAKFAST What changed:  how much to take how to take this when to take this   metoprolol tartrate 25 MG tablet Commonly known as: LOPRESSOR Take 25 mg by mouth 2 (two) times daily.   metroNIDAZOLE 500 MG/100ML Commonly known as: FLAGYL Inject 100 mLs (500 mg total) into the vein every 8 (eight) hours.   multivitamin with minerals Tabs tablet Take 1 tablet by mouth daily. One a day   ondansetron 4 MG tablet Commonly known as: ZOFRAN Take 1 tablet (4 mg total) by mouth every 6 (six) hours as needed for nausea.   oxyCODONE 5 MG immediate release tablet Commonly known as: Oxy IR/ROXICODONE Take 1 tablet (5 mg total) by mouth every 4 (four) hours as needed for moderate pain.   rosuvastatin 10 MG tablet Commonly known as: Crestor Take 1 tablet (10 mg total) by mouth daily. TAKE 1 BY MOUTH DAILY   vancomycin 250 MG capsule Commonly known as: VANCOCIN Take 2 capsules (500 mg total) by mouth every 6 (six) hours.       Discharge Exam: Filed Weights   01/30/23 1524  Weight: 45.4 kg   Vitals:   01/31/23 0440 01/31/23 1316  BP: 117/75 (!) 144/65  Pulse: 91 68  Resp: 16 18  Temp: 98.1 F (36.7 C) 98 F (36.7 C)  SpO2: 95% 98%   Examination: Physical Exam:  Constitutional: Thin chronically ill-appearing Caucasian female who appears a little uncomfortable Respiratory: Diminished to auscultation bilaterally, no wheezing, rales, rhonchi  or crackles. Normal respiratory effort and patient is not tachypenic. No accessory muscle use.  Unlabored breathing Cardiovascular: Irregularly irregular and slightly tachycardic, no murmurs / rubs / gallops. S1 and S2 auscultated.  No appreciable extremity edema Abdomen: Soft, slightly tender. mildly distended. Bowel sounds positive.  GU: Deferred. Musculoskeletal: No clubbing / cyanosis of digits/nails. No joint deformity upper and lower extremities.  Skin: No rashes, lesions, ulcers. No induration; Warm and dry.  Neurologic: CN 2-12 grossly intact with no focal deficits. Romberg sign and cerebellar reflexes not assessed.  Psychiatric: Normal judgment and insight. Alert and oriented x 3. Normal mood and appropriate affect.   Condition at discharge: stable  The results of significant diagnostics from this hospitalization (including imaging, microbiology, ancillary and laboratory) are listed below for reference.  Imaging Studies: DG CHEST PORT 1 VIEW  Result Date: 01/31/2023 CLINICAL DATA:  Follow-up hydropneumothorax. EXAM: PORTABLE CHEST 1 VIEW COMPARISON:  01/30/2023 FINDINGS: Stable cardiomegaly. Mild bibasilar atelectasis shows no significant change. Small bilateral pleural effusions appears stable. Small right pneumothorax seen on recent chest CT is not well visualized on this exam. Extensive subcutaneous emphysema is seen throughout the chest and neck soft tissues. IMPRESSION: Stable mild bibasilar atelectasis and small bilateral pleural effusions. Right pneumothorax seen on recent chest CT is not well visualized on this exam. Extensive subcutaneous emphysema. Electronically Signed   By: Danae Orleans M.D.   On: 01/31/2023 09:01   CT CHEST ABDOMEN PELVIS W CONTRAST  Result Date: 01/30/2023 CLINICAL DATA:  Sepsis. Diarrhea for 1 day. Recent hospitalization discharge 5 days ago. Bilateral feet edema. History of lung cancer EXAM: CT CHEST, ABDOMEN, AND PELVIS WITH CONTRAST TECHNIQUE:  Multidetector CT imaging of the chest, abdomen and pelvis was performed following the standard protocol during bolus administration of intravenous contrast. RADIATION DOSE REDUCTION: This exam was performed according to the departmental dose-optimization program which includes automated exposure control, adjustment of the mA and/or kV according to patient size and/or use of iterative reconstruction technique. CONTRAST:  75mL OMNIPAQUE IOHEXOL 300 MG/ML  SOLN COMPARISON:  Chest radiographs earlier today and CT chest abdomen and pelvis 11/21/2022 FINDINGS: CT CHEST FINDINGS Cardiovascular: Normal heart size. Unchanged small-moderate pericardial effusion. No aortic aneurysm or dissection. No central pulmonary embolism. Coronary artery and aortic atherosclerotic calcification. Mediastinum/Nodes: Extensive pneumomediastinum. Unremarkable esophagus. No thoracic adenopathy. Lungs/Pleura: Right apical hydropneumothorax with air-fluid level as seen on radiographs earlier today. Additional small right basilar hydropneumothorax. Postsurgical changes from right lower lobectomy. New patchy ground-glass opacities in the posterior lower right lung. No left pleural effusion or pneumothorax. Redemonstrated part solid nodule in the left lung apex measuring up to 2.1 cm in overall diameter with 12 mm solid component (4/19). Additional pulmonary nodules in the right lung are unchanged. For example 11 mm nodule in the lateral right upper lobe (4/53). Bilateral subpleural reticular and ground-glass opacities with architectural distortion and bronchiolectasis compatible with interstitial lung disease. Musculoskeletal: Extensive subcutaneous emphysema throughout the chest wall extending into the visualized portions of the bilateral neck and upper abdomen. The right anterolateral third rib is fractured in 2 places (4/48). Additional mildly displaced fracture of the lateral right fourth rib subacute or chronic left 6th-10th rib fractures.  Severe compression deformity of T11, T12, and L1 with vertebroplasty changes in T12 and L1 are unchanged. CT ABDOMEN PELVIS FINDINGS Hepatobiliary: Hepatic cyst in the right hepatic dome. No acute abnormality. Pancreas: Unremarkable. Spleen: Unremarkable. Adrenals/Urinary Tract: Stable adrenal glands. Bilateral cortical renal scarring. No urinary calculi or hydronephrosis. Distended bladder. Stomach/Bowel: Diffuse wall thickening of the colon with mucosal hyperenhancement greatest about the ascending colon. Dilated loops of small bowel in the left hemiabdomen without definite transition point. There is mild wall thickening about the terminal ileum. Stomach is within normal limits. Vascular/Lymphatic: Aortic atherosclerotic calcification. No lymphadenopathy. Reproductive: Unremarkable. Other: No free intraperitoneal air. Trace free fluid in the right pericolic gutter. Musculoskeletal: No acute osseous abnormality. IMPRESSION: CHEST: 1. Postsurgical changes from right lower lobectomy. 2. Small right apical hydropneumothorax with air-fluid level as seen on radiographs earlier today. Additional small right basilar hydropneumothorax. 3. New patchy ground-glass opacities in the posterior lower right lung, which could be infectious or inflammatory or due to contusion in the setting of trauma. 4. Extensive pneumomediastinum. Unchanged small-moderate pericardial effusion. 5. Acute fractures of the right anterolateral  third and fourth ribs. 6. Extensive subcutaneous emphysema throughout the chest wall extending into the visualized portions of the bilateral neck and upper abdomen. 7. Redemonstrated part solid nodule in the left lung apex measuring up to 2.1 cm in overall diameter with 12 mm solid component. Additional pulmonary nodules in the right lung are unchanged. Attention on follow-up. ABDOMEN/PELVIS: 1. Diffuse wall thickening of the colon with mucosal hyperenhancement greatest about the ascending colon, consistent with  nonspecific pancolitis. 2. Dilated loops of small bowel in the left hemiabdomen without definite transition point. This may represent ileus however obstruction is difficult to exclude. Electronically Signed   By: Minerva Fester M.D.   On: 01/30/2023 19:57   DG Chest 2 View  Result Date: 01/30/2023 CLINICAL DATA:  Subcutaneous emphysema. Recent right lower lobe lobectomy EXAM: CHEST - 2 VIEW COMPARISON:  11/21/2022 FINDINGS: Mild cardiomegaly. Postsurgical changes from right lower lobectomy. Small right hydropneumothorax with small apical air component and small layering effusion. Patchy opacities within the right lung base. No left-sided pleural effusion or pneumothorax is evident. Extensive subcutaneous emphysema of the bilateral chest wall and bilateral neck as well as within the proximal left upper extremity. Multiple chronic compression deformity centered at the thoracolumbar junction. IMPRESSION: 1. Postsurgical changes to the chest with small right hydropneumothorax. No evidence of a tension component. 2. Patchy opacities within the right lung base, which may represent atelectasis versus pneumonia. 3. Extensive subcutaneous emphysema of the chest, neck, and left upper extremity. These results were called by telephone at the time of interpretation on 01/30/2023 at 7:15 pm to provider JOSHUA LONG , who verbally acknowledged these results. Electronically Signed   By: Duanne Guess D.O.   On: 01/30/2023 19:15    Microbiology: Results for orders placed or performed during the hospital encounter of 01/30/23  C Difficile Quick Screen w PCR reflex     Status: Abnormal   Collection Time: 01/31/23  6:41 AM   Specimen: STOOL  Result Value Ref Range Status   C Diff antigen POSITIVE (A) NEGATIVE Final   C Diff toxin POSITIVE (A) NEGATIVE Final   C Diff interpretation Toxin producing C. difficile detected.  Final    Comment: CRITICAL RESULT CALLED TO, READ BACK BY AND VERIFIED WITH: EDWARDS,J. RN @0850  ON  4.20.2024 BY [facility] Performed at [facility], [address], [place], [place] [address]    Labs: CBC: Recent Labs  Lab 01/30/23 1725 01/31/23 0522  WBC 36.7* 34.9*  NEUTROABS 31.7*  --   HGB 11.9* 11.2*  HCT 36.4 34.7*  MCV 93.8 95.6  PLT 410* 333   Basic Metabolic Panel: Recent Labs  Lab 01/30/23 1725 01/31/23 0522  NA 133* 135  K 4.0 4.0  CL 99 103  CO2 23 22  GLUCOSE 101* 88  BUN 15 12  CREATININE 0.91 0.95  CALCIUM 8.5* 8.2*  MG  --  1.9   Liver Function Tests: Recent Labs  Lab 01/30/23 1725 01/31/23 0522  AST 55* 28  ALT 68* 44  ALKPHOS 341* 249*  BILITOT 0.4 0.7  PROT 7.0 5.6*  ALBUMIN 2.7* 2.3*   CBG: No results for input(s): "GLUCAP" in the last 168 hours.  Discharge time spent: greater than 30 minutes.  Signed: Marguerita Merles, DO [facility] 01/31/2023

## 2023-02-09 ENCOUNTER — Inpatient Hospital Stay: Payer: Medicare Other | Admitting: Family Medicine

## 2023-02-11 ENCOUNTER — Inpatient Hospital Stay: Payer: Medicare Other | Admitting: Family Medicine

## 2023-02-11 ENCOUNTER — Telehealth: Payer: Self-pay | Admitting: *Deleted

## 2023-02-11 ENCOUNTER — Encounter: Payer: Self-pay | Admitting: *Deleted

## 2023-02-11 NOTE — Transitions of Care (Post Inpatient/ED Visit) (Signed)
02/11/2023  Name: [name] MRN: [id number] DOB: [birth year]  Today's TOC FU Call Status: Today's TOC FU Call Status:: Successful TOC FU Call Competed TOC FU Call Complete Date: 02/11/23  Transition Care Management Follow-up Telephone Call Date of Discharge: 02/10/23 Discharge Facility: Other ([facility]) Name of Other ([facility]) Discharge Facility: [facility]-- transferred to [facility] from [facility]-- had recent (R) lower lobectomy at [facility] Type of Discharge: Inpatient Admission Primary Inpatient Discharge Diagnosis:: C-Difficile colitis post-recent lung surgery at [facility] How have you been since you were released from the hospital?: Better ("I am okay, so far doing well.  I have a private duty caregiver who is helping me with anything I need, she is here now.  Please call my daughter about my doctor appointment and my medicines") Any questions or concerns?: No  Items Reviewed: Did you receive and understand the discharge instructions provided?: Yes Medications obtained,verified, and reconciled?: Yes (Medications Reviewed) (Daughter and patient reports unable to complete full medication reconciliation- daughter manages medications/ is at work; reports obtained/ taking "one new" medication post-hospital discharge for "irregular heart beat;" denies concerns around medications) Any new allergies since your discharge?: No Dietary orders reviewed?: Yes Type of Diet Ordered:: "Healthy diet" Do you have support at home?: Yes People in Home: spouse Name of Support/Comfort Primary Source: Reports independent in self-care activities; supportive spouse/ extended family/ private duty caregiver assists as/ if needed/ indicated  Medications Reviewed Today: Medications Reviewed Today     Reviewed by Michaela Corner, RN (Registered Nurse) on 02/11/23 at 1146  Med List Status: <None>   Medication Order Taking? Sig Documenting Provider Last Dose Status Informant  acetaminophen (TYLENOL) 500 MG  tablet [id number] No Take 2 tablets (1,000 mg total) by mouth 3 (three) times daily as needed for mild pain. [name], MD Past Week Active Child, Self  amLODipine (NORVASC) 2.5 MG tablet [id number] No Take 1 tablet (2.5 mg total) by mouth daily. Copland, [name], MD 01/30/2023 Active Child, Self  Calcium Carbonate-Vitamin D (CALCIUM + D PO) [id number] No Take 1 capsule by mouth daily.  [provider] 01/30/2023 Active Child, Self  cloNIDine (CATAPRES) 0.1 MG tablet [id number] No Take 1 tablet (0.1 mg total) by mouth daily. Use if needed for vocal tic Copland, [name], MD 01/30/2023 Active Child, Self  latanoprost (XALATAN) 0.005 % ophthalmic solution [id number] No Place 1 drop into both eyes at bedtime.  [provider] Past Week Active Child, Self  levothyroxine (SYNTHROID) 75 MCG tablet [id number] No TAKE 1 TABLET BY MOUTH DAILY BEFORE BREAKFAST  Patient taking differently: Take 75 mcg by mouth daily before breakfast. TAKE 1 TABLET BY MOUTH DAILY BEFORE BREAKFAST   Copland, [name], MD 01/30/2023 Active Child, Self  metoprolol tartrate (LOPRESSOR) 25 MG tablet [id number] No Take 25 mg by mouth 2 (two) times daily. [provider] 01/30/2023 0800 Active Child, Self  metroNIDAZOLE (FLAGYL) 500 MG/100ML [id number]  Inject 100 mLs (500 mg total) into the vein every 8 (eight) hours. Sheikh, Omair Josephine, DO  Active   Multiple Vitamin (MULTIVITAMIN WITH MINERALS) TABS tablet [id number] No Take 1 tablet by mouth daily. One a day [provider] 01/30/2023 Active Child, Self  ondansetron (ZOFRAN) 4 MG tablet [id number]  Take 1 tablet (4 mg total) by mouth every 6 (six) hours as needed for nausea. Sheikh, Omair [place], [place]  Active   oxyCODONE (OXY IR/ROXICODONE) 5 MG immediate release tablet [id number]  Take 1 tablet (5 mg total) by mouth every  4 (four) hours as needed for moderate pain. Sheikh, Omair [place], DO  Active   rosuvastatin (CRESTOR) 10 MG tablet [id number] No Take 1  tablet (10 mg total) by mouth daily. TAKE 1 BY MOUTH DAILY Copland, [name], MD 01/30/2023 Active Child, Self  vancomycin (VANCOCIN) 250 MG capsule [id number]  Take 2 capsules (500 mg total) by mouth every 6 (six) hours. Marguerita Merles [place], [place]  Active             Home Care and Equipment/Supplies: Were Home Health Services Ordered?: No (daughter reports home health is pending until after patient has specialist follow up appointment at [facility] on 02/23/23- provided education around process to ensure home health services are ordered by provider as planned) Any new equipment or medical supplies ordered?: No  Functional Questionnaire: Do you need assistance with bathing/showering or dressing?: Yes (spouse/ extended family/ private duty caregiver assisting as needed) Do you need assistance with meal preparation?: Yes (spouse/ extended family/ private duty caregiver assisting as needed) Do you need assistance with eating?: No Do you have difficulty maintaining continence: No Do you need assistance with getting out of bed/getting out of a chair/moving?: No (reports currently using walker for ambulation- spouse/ extended family/ private duty caregiver assisting as needed) Do you have difficulty managing or taking your medications?: Yes (reports daughter is managing medications post- recent hospitalizations; daughter unable to fully review during TOC call due to being at work/ not near medications- denies medication concerns, as above)  Follow up appointments reviewed: PCP Follow-up appointment confirmed?: Yes (care coordination outreach in real-time with scheduling care guide to successfully schedule hospital follow up PCP appointment 02/16/23) Date of PCP follow-up appointment?: 02/16/23 Follow-up Provider: PCP Specialist Hospital Follow-up appointment confirmed?: Yes Date of Specialist follow-up appointment?: 02/23/23 Follow-Up Specialty Provider:: Surgical provider at [facility] Do you need transportation  to your follow-up appointment?: No Do you understand care options if your condition(s) worsen?: Yes-patient verbalized understanding  SDOH Interventions Today    Flowsheet Row Most Recent Value  SDOH Interventions   Food Insecurity Interventions Intervention Not Indicated  Transportation Interventions Intervention Not Indicated  [spouse/ extended family/ private duty caregiver provides transportation]      TOC Interventions Today    Flowsheet Row Most Recent Value  TOC Interventions   TOC Interventions Discussed/Reviewed TOC Interventions Discussed, Arranged PCP follow up within 7 days/Care Guide scheduled  [provided my direct contact information should questions/ concerns/ needs arise post-TOC call, prior to RN CM telephone visit  02/25/23]      Interventions Today    Flowsheet Row Most Recent Value  Chronic Disease   Chronic disease during today's visit Other  [C- Diff colitis post-recent surgery for (R) lower lobectomy: [facility]]  General Interventions   General Interventions Discussed/Reviewed General Interventions Discussed, Doctor Visits, Referral to Nurse, Communication with, Durable Medical Equipment (DME)  Doctor Visits Discussed/Reviewed Doctor Visits Discussed, Specialist, PCP  Durable Medical Equipment (DME) Dan Humphreys  PCP/Specialist Visits Compliance with follow-up visit  Communication with RN  [scheduled with RN CM Care Coordinator for follow up call 02/25/23]  Education Interventions   Education Provided Provided Education  Provided Verbal Education On Other, Medication  [need for full medication reconciliation at time of hospital follow up visit with PCP,  process to obtain home health services after patient has specialist visit on 02/23/23]  Nutrition Interventions   Nutrition Discussed/Reviewed Nutrition Discussed  Pharmacy Interventions   Pharmacy Dicussed/Reviewed Pharmacy Topics Discussed  Safety Interventions   Safety Discussed/Reviewed Safety Discussed  [name], RN, BSN, CCRN Alumnus RN CM Care Coordination/ Transition of [place] Management [phone]: direct office

## 2023-02-12 ENCOUNTER — Telehealth (HOSPITAL_COMMUNITY): Payer: Self-pay

## 2023-02-12 NOTE — Telephone Encounter (Signed)
Returned pt daughter [name] phone call in regards to PR. [name] pt will need to complete her f/u appt and be cleared before scheduling.

## 2023-02-13 NOTE — Progress Notes (Unsigned)
[facility] at [facility] [address], [address] [place], [place] [address] [phone]  Date:  02/16/2023   Name:  [name]   DOB:  1930/12/22   MRN:  [id number]  PCP:  Pearline Cables, MD    Chief Complaint: No chief complaint on file.   History of Present Illness:  [name] is a 89 y.o. very pleasant female patient who presents with the following:  Most recent visit with myself was in [place] that time she had recently been diagnosed with lung cancer found incidentally on imaging studies for rib injury History of hypertension, hyperlipidemia, hypothyroidism, breast cancer, more recently some difficulty with paranoia and anxiety   [name] is seen today for follow-up from recent hospital admission She was admitted from 4/20 through 4/30 at [facility] with C. Difficile HISTORY OF PRESENT ILLNESS: [name] is a 89 y.o. female with medical history significant for hypertension, hyperlipidemia, hypothyroidism, CKD 3A, breast cancer, adenocarcinoma status post right lower lobectomy on 01/20/2023, and paroxysmal atrial fibrillation not anticoagulated who presents as a transfer from OSH for C diff management. Patient reports that she had intermittent diarrhea while hospitalized at [facility] earlier this month but had been doing well since her discharge a few days ago until recently when she had large-volume watery diarrhea. She went on to have another episode, and then additional episodes where she was unable to make it to the bathroom. She denies fevers, abdominal pain, nausea, or vomiting associated with this. She denies history of C. difficile colitis. She also denies chest pain, shortness of breath, or cough.  Patient took Imodium at home and has not had any diarrhea since then. Admitted to [facility] for further management for C. Diff. and now transferred to [facility] for continuity of care. HOSPITAL COURSE: She presented to [facility] on 01/31/2023 to undergo treatment for C Difficile Colitis. Her WBC was initially in the 40s but down-trended appropriately with Fidaxomicin. At the beginning of her stay she received frequent boluses of crystalloid to help replete her GI losses, but as her stay progressed she was able to recoup her own losses with PO intake. At time of discharge her white count has come down to 11 and she has no abdominal pain. Her bowel movements are formed and reduced in number and she is able to take PO adequate to maintain her own nutrition and hydration. She is ambulating, voiding, eating, and in all other ways appropriate for discharge. She completed 10 day course of Fidaxomicin prior to discharge.  Rhythm at the time of discharge was normal sinus rhythm. She had restored GI function and was tolerating a regular diet. All surgical incisions from surgery earlier this month were healing well.  She was also admitted to [facility] earlier in April-April 9 through April 17 for treatment of her lung cancer HOSPITAL COURSE: She presented to [facility] on 01/20/2023 to undergo surgical intervention for lung cancer. She was taken to the operating room by Dr. Glenna Durand, who performed the above procedure(s). Overall, she tolerated the procedure well without significant difficulty or evident complication and was transferred to the recovery room in stable condition. In the immediate postoperative period, she continued to do well. As she was doing well, she was transitioned to the step-down floor for the remainder of her hospital course.   The hospital course was significant for the following: leukocytosis of unknown etiology with negative blood cultures,  negative U/A, negative pleural fluid culture and no localizing symptoms, afebrile. She was placed on a two day empiric course of IV zosyn and her WBC was downtrending at the time of discharge. She also had asymptomatic, stable atrial fibrillation with rapid  ventricular response for which she was placed on metoprolol 25mg  BID. She was discharged with this regimen for rate control. Rhythm at the time of discharge was normal sinus rhythm. She had restored GI function and was tolerating a regular diet. All surgical incisions were healing well. After discussion with the case manager, patient and family; the decision was made to have patient discharge to home. Once follow-up arrangements were confirmed, the patient was felt suitable for discharge to home on 01/28/2023.    Patient Active Problem List   Diagnosis Date Noted   Pancolitis (HCC) 01/30/2023   Atrial fibrillation (HCC) 01/30/2023   Hydropneumothorax 01/30/2023   Adenocarcinoma of lower lobe of right lung (HCC) 01/30/2023   Rib fractures 11/21/2022   Right lower lobe lung mass 11/21/2022   Autoimmune disease (HCC) 07/31/2021   Barrett's esophagus 07/31/2021   Bone lesion 07/31/2021   Cervical cancer (HCC) 07/31/2021   Dysphagia 07/31/2021   Hypothyroidism 07/31/2021   Chronic midline thoracic back pain 12/31/2020   Paroxysmal SVT (supraventricular tachycardia) 12/23/2020   Intracranial hemorrhage (HCC)    TBI (traumatic brain injury) (HCC) 05/04/2020   Back pain 05/04/2020   SAH (subarachnoid hemorrhage) (HCC) 04/29/2020   Orbital fracture, closed, initial encounter (HCC) 04/29/2020   Basal cell carcinoma (BCC) in situ of skin 04/04/2020   Essential hypertension 03/21/2019   Stage 3a chronic kidney disease (HCC)    Systolic murmur 02/23/2019   Hypercalcemia    Dizziness 02/22/2019   Squamous cell carcinoma in situ of skin 11/30/2018   PVC (premature ventricular contraction) 09/08/2017   Glaucoma 09/08/2017   Hx of meningioma of the brain 08/28/2017   Hoarseness of voice 02/28/2016   Raynaud's phenomenon 02/28/2016   Esophageal spasm 09/20/2013   Osteoporosis 03/18/2012   Humerus fracture 03/2012   Fracture, shoulder 03/06/2012   Hyperlipidemia    Breast cancer (HCC)      Past Medical History:  Diagnosis Date   Acute blood loss anemia    AKI (acute kidney injury) (HCC)    Autoimmune disease (HCC)    Back pain 05/04/2020   Barrett's esophagus    Basal cell carcinoma (BCC) in situ of skin 04/04/2020   Path 03/2020, [facility]    Bone lesion    sacrum   Breast cancer (HCC) 2010   cT1 N0 M0; ER+/ PR+/ HER2 neg; s/p lumpectomy 07/2010; started adjuvant hormonal therapy 10/2009   Cellulitis of skin 12/31/2020   Cervical cancer (HCC)    Chronic midline thoracic back pain 12/31/2020   Diarrhea 09/08/2017   Dizziness 02/22/2019   Dysphagia    Elevated serum creatinine 09/08/2017   Esophageal spasm 09/20/2013   Essential hypertension 03/21/2019   Fall at home, initial encounter 04/29/2020   Fracture, shoulder 03/06/2012   Pt seen at [facility] by Dr. Bradly Bienenstock on 03/09/2012 and 03/11/2012.   X-rays taken of left wrist, left elbow, and left shoulder.   Shoulder Fx: nondisplaced proximal humerus fracture involving the greater tuberosity. Wrist: consistent with an old distal radius fracture with shortening present.       Glaucoma    Hoarseness of voice 02/28/2016   Humerus fracture 03/2012   impacted left humueral neck fracture, treated by Dr. Amanda Pea   Hx of meningioma  of the brain 08/28/2017   Stable since 2010- 2018 on MRI   Hypercalcemia    Hyperlipidemia    Hypertension    Hypothyroidism    Intracranial hemorrhage (HCC)    Nausea vomiting and diarrhea 09/08/2017   Orbital fracture, closed, initial encounter (HCC) 04/29/2020   Osteoporosis 03/18/2012   Paroxysmal SVT (supraventricular tachycardia) 12/23/2020   Pneumonia    PVC (premature ventricular contraction) 09/08/2017   Raynaud's phenomenon 02/28/2016   SAH (subarachnoid hemorrhage) (HCC) 04/29/2020   Squamous cell carcinoma in situ of skin 11/30/2018   Path 11/1008, Dermatology managing    Stage 3a chronic kidney disease    Systolic murmur 02/23/2019   TBI (traumatic brain injury)  (HCC) 05/04/2020    Past Surgical History:  Procedure Laterality Date   ABDOMINAL HYSTERECTOMY  1972   cancer          1 tube 1 ovary   APPENDECTOMY     BACK SURGERY  11/28/2020   BREAST LUMPECTOMY Left 2010   colon polyp removal     EYE SURGERY     HAND SURGERY     KYPHOPLASTY N/A 11/28/2020   Procedure: KYPHOPLASTY THORACIC TWELVE AND LUMBAR ONE;  Surgeon: Venita Lick, MD;  Location: [facility] OR;  Service: Orthopedics;  Laterality: N/A;  90 mins   ortho procedures     multiple   OVARIAN CYST REMOVAL  1978   SHOULDER SURGERY  1954   left    Social History   Tobacco Use   Smoking status: Former    Types: Cigarettes    Quit date: 12/16/1986    Years since quitting: 36.1   Smokeless tobacco: Never   Tobacco comments:    FORMER SMOKER 30 YEARS AGO  Vaping Use   Vaping Use: Never used  Substance Use Topics   Alcohol use: Yes    Alcohol/week: 0.0 standard drinks of alcohol    Comment: OCCASIONALLY   Drug use: No    Family History  Problem Relation Age of Onset   Heart disease Father    Cancer Sister        lung, kidney   Cancer Brother        brain   Cancer Paternal Aunt        breast   Breast cancer Paternal Aunt    Cancer Brother        liver, lung   Osteoporosis Neg Hx     Allergies  Allergen Reactions   Daypro [Oxaprozin] Swelling    Face and tongue   Prolia [Denosumab]     unknown    Medication list has been reviewed and updated.  Current Outpatient Medications on File Prior to Visit  Medication Sig Dispense Refill   acetaminophen (TYLENOL) 500 MG tablet Take 2 tablets (1,000 mg total) by mouth 3 (three) times daily as needed for mild pain.     amLODipine (NORVASC) 2.5 MG tablet Take 1 tablet (2.5 mg total) by mouth daily. 90 tablet 3   Calcium Carbonate-Vitamin D (CALCIUM + D PO) Take 1 capsule by mouth daily.      cloNIDine (CATAPRES) 0.1 MG tablet Take 1 tablet (0.1 mg total) by mouth daily. Use if needed for vocal tic 30 tablet 3   latanoprost  (XALATAN) 0.005 % ophthalmic solution Place 1 drop into both eyes at bedtime.      levothyroxine (SYNTHROID) 75 MCG tablet TAKE 1 TABLET BY MOUTH DAILY BEFORE BREAKFAST (Patient taking differently: Take 75 mcg by mouth daily before breakfast.  TAKE 1 TABLET BY MOUTH DAILY BEFORE BREAKFAST) 90 tablet 3   metoprolol tartrate (LOPRESSOR) 25 MG tablet Take 25 mg by mouth 2 (two) times daily.     metroNIDAZOLE (FLAGYL) 500 MG/100ML Inject 100 mLs (500 mg total) into the vein every 8 (eight) hours.     Multiple Vitamin (MULTIVITAMIN WITH MINERALS) TABS tablet Take 1 tablet by mouth daily. One a day     ondansetron (ZOFRAN) 4 MG tablet Take 1 tablet (4 mg total) by mouth every 6 (six) hours as needed for nausea. 20 tablet 0   oxyCODONE (OXY IR/ROXICODONE) 5 MG immediate release tablet Take 1 tablet (5 mg total) by mouth every 4 (four) hours as needed for moderate pain. 30 tablet 0   rosuvastatin (CRESTOR) 10 MG tablet Take 1 tablet (10 mg total) by mouth daily. TAKE 1 BY MOUTH DAILY 90 tablet 3   vancomycin (VANCOCIN) 250 MG capsule Take 2 capsules (500 mg total) by mouth every 6 (six) hours.     No current facility-administered medications on file prior to visit.    Review of Systems:  As per HPI- otherwise negative.   Physical Examination: There were no vitals filed for this visit. There were no vitals filed for this visit. There is no height or weight on file to calculate BMI. Ideal Body Weight:    GEN: no acute distress. HEENT: Atraumatic, Normocephalic.  Ears and Nose: No external deformity. CV: RRR, No M/G/R. No JVD. No thrill. No extra heart sounds. PULM: CTA B, no wheezes, crackles, rhonchi. No retractions. No resp. distress. No accessory muscle use. ABD: S, NT, ND, +BS. No rebound. No HSM. EXTR: No c/c/e PSYCH: Normally interactive. Conversant.    Assessment and Plan: ***  Signed Abbe Amsterdam, MD

## 2023-02-16 ENCOUNTER — Ambulatory Visit (INDEPENDENT_AMBULATORY_CARE_PROVIDER_SITE_OTHER): Payer: Medicare Other | Admitting: Family Medicine

## 2023-02-16 ENCOUNTER — Encounter: Payer: Self-pay | Admitting: Family Medicine

## 2023-02-16 VITALS — BP 145/85 | HR 70 | Temp 97.8°F | Resp 18 | Ht 64.0 in | Wt 105.8 lb

## 2023-02-16 DIAGNOSIS — Z09 Encounter for follow-up examination after completed treatment for conditions other than malignant neoplasm: Secondary | ICD-10-CM

## 2023-02-16 DIAGNOSIS — R790 Abnormal level of blood mineral: Secondary | ICD-10-CM | POA: Diagnosis not present

## 2023-02-16 DIAGNOSIS — E876 Hypokalemia: Secondary | ICD-10-CM

## 2023-02-16 DIAGNOSIS — E039 Hypothyroidism, unspecified: Secondary | ICD-10-CM

## 2023-02-16 DIAGNOSIS — I10 Essential (primary) hypertension: Secondary | ICD-10-CM | POA: Diagnosis not present

## 2023-02-16 DIAGNOSIS — E782 Mixed hyperlipidemia: Secondary | ICD-10-CM

## 2023-02-16 DIAGNOSIS — Z8619 Personal history of other infectious and parasitic diseases: Secondary | ICD-10-CM

## 2023-02-16 DIAGNOSIS — C3492 Malignant neoplasm of unspecified part of left bronchus or lung: Secondary | ICD-10-CM

## 2023-02-16 DIAGNOSIS — E875 Hyperkalemia: Secondary | ICD-10-CM

## 2023-02-16 LAB — COMPREHENSIVE METABOLIC PANEL
ALT: 27 U/L (ref 0–35)
AST: 26 U/L (ref 0–37)
Albumin: 3.4 g/dL — ABNORMAL LOW (ref 3.5–5.2)
Alkaline Phosphatase: 177 U/L — ABNORMAL HIGH (ref 39–117)
BUN: 14 mg/dL (ref 6–23)
CO2: 29 mEq/L (ref 19–32)
Calcium: 10 mg/dL (ref 8.4–10.5)
Chloride: 102 mEq/L (ref 96–112)
Creatinine, Ser: 0.96 mg/dL (ref 0.40–1.20)
GFR: 52.33 mL/min — ABNORMAL LOW (ref >60.00)
Glucose, Bld: 94 mg/dL (ref 70–99)
Potassium: 5.9 mEq/L — ABNORMAL HIGH (ref 3.5–5.1)
Sodium: 141 mEq/L (ref 135–145)
Total Bilirubin: 0.4 mg/dL (ref 0.2–1.2)
Total Protein: 6.1 g/dL (ref 6.0–8.3)

## 2023-02-16 LAB — MAGNESIUM: Magnesium: 2 mg/dL (ref 1.5–2.5)

## 2023-02-16 LAB — CBC
HCT: 34.4 % — ABNORMAL LOW (ref 36.0–46.0)
Hemoglobin: 11.4 g/dL — ABNORMAL LOW (ref 12.0–15.0)
MCHC: 33.2 g/dL (ref 30.0–36.0)
MCV: 94.5 fl (ref 78.0–100.0)
Platelets: 556 10*3/uL — ABNORMAL HIGH (ref 150.0–400.0)
RBC: 3.64 Mil/uL — ABNORMAL LOW (ref 3.87–5.11)
RDW: 16.9 % — ABNORMAL HIGH (ref 11.5–15.5)
WBC: 10.4 10*3/uL (ref 4.0–10.5)

## 2023-02-16 NOTE — Patient Instructions (Signed)
Good to see you again today- I will be in touch with your labs asap  Let me know if you need anything

## 2023-02-16 NOTE — Addendum Note (Signed)
Addended by: Abbe Amsterdam C on: 02/16/2023 04:45 PM   Modules accepted: Orders

## 2023-02-19 ENCOUNTER — Other Ambulatory Visit (INDEPENDENT_AMBULATORY_CARE_PROVIDER_SITE_OTHER): Payer: Medicare Other

## 2023-02-19 DIAGNOSIS — C3491 Malignant neoplasm of unspecified part of right bronchus or lung: Secondary | ICD-10-CM | POA: Diagnosis not present

## 2023-02-19 DIAGNOSIS — E875 Hyperkalemia: Secondary | ICD-10-CM | POA: Diagnosis not present

## 2023-02-20 ENCOUNTER — Encounter: Payer: Self-pay | Admitting: Family Medicine

## 2023-02-20 LAB — POTASSIUM: Potassium: 4.7 mEq/L (ref 3.5–5.1)

## 2023-02-23 DIAGNOSIS — Z87891 Personal history of nicotine dependence: Secondary | ICD-10-CM | POA: Diagnosis not present

## 2023-02-23 DIAGNOSIS — C3431 Malignant neoplasm of lower lobe, right bronchus or lung: Secondary | ICD-10-CM | POA: Diagnosis not present

## 2023-02-23 DIAGNOSIS — J90 Pleural effusion, not elsewhere classified: Secondary | ICD-10-CM | POA: Diagnosis not present

## 2023-02-25 ENCOUNTER — Ambulatory Visit: Payer: Self-pay

## 2023-02-25 NOTE — Patient Instructions (Addendum)
Visit Information  Thank you for taking time to visit with me today. Please don't hesitate to contact me if I can be of assistance to you.   Following are the goals we discussed today:   Goals Addressed             This Visit's Progress    continue to improve post hospitalization       Interventions Today    Flowsheet Row Most Recent Value  Chronic Disease   Chronic disease during today's visit Other  [C- Diff colitis post-recent surgery for (R) lower lobectomy: [facility]]  General Interventions   General Interventions Discussed/Reviewed General Interventions Discussed, Doctor Visits  [reviewed are coordination program with patient. provided RNCM contact number]  Doctor Visits Discussed/Reviewed PCP, Doctor Visits Discussed  PCP/Specialist Visits Compliance with follow-up visit  [reviewed upcoming scheduled appointments]  Exercise Interventions   Exercise Discussed/Reviewed Exercise Discussed  [assess activity level-reports walks routinely. states walks are about a mile. States always has someone with her when she goes on her walks]  Education Interventions   Education Provided Provided Education  Provided Verbal Education On Exercise, Medication, Nutrition  Nutrition Interventions   Nutrition Discussed/Reviewed Nutrition Discussed  Pharmacy Interventions   Pharmacy Dicussed/Reviewed Pharmacy Topics Discussed  Safety Interventions   Safety Discussed/Reviewed Safety Discussed  [discussed fall prevention strategies. provided positive feedback with paitent regarding how she continues to work on muscle strength/tone with walking]              Our next appointment is by telephone on 03/23/23 at 9:30 am  Please call the care guide team at [phone] if you need to cancel or reschedule your appointment.   If you are experiencing a Mental Health or Behavioral Health Crisis or need someone to talk to, please call the Suicide and Crisis Lifeline: 32  Kathyrn Sheriff, RN, MSN, BSN,  CCM [facility] Care Coordinator [phone]

## 2023-02-25 NOTE — Patient Outreach (Addendum)
  Care Coordination   Initial Visit Note   02/25/2023 Name: [name] MRN: [id number] DOB: [birth year]  [name] is a 89 y.o. year old female who sees Copland, [name], MD for primary care. I spoke with  [name] by phone today.  What matters to the patients health and wellness today?  [name] reports she is doing much better. She denies any diarrhea and reports she is working on building back her energy and strength. She states she has started walking again(does not walk alone). And she reports she is eating better. She denies any questions or concerns at this time.  Goals Addressed             This Visit's Progress    continue to improve post hospitalization       Interventions Today    Flowsheet Row Most Recent Value  Chronic Disease   Chronic disease during today's visit Other  [C- Diff colitis post-recent surgery for (R) lower lobectomy: [facility]]  General Interventions   General Interventions Discussed/Reviewed General Interventions Discussed, Doctor Visits  [reviewed are coordination program with patient. provided RNCM contact number]  Doctor Visits Discussed/Reviewed PCP, Doctor Visits Discussed  PCP/Specialist Visits Compliance with follow-up visit  [reviewed upcoming scheduled appointments]  Exercise Interventions   Exercise Discussed/Reviewed Exercise Discussed  [assess activity level-reports walks routinely. states walks are about a mile. States always has someone with her when she goes on her walks]  Education Interventions   Education Provided Provided Education  Provided Verbal Education On Exercise, Medication, Nutrition  Nutrition Interventions   Nutrition Discussed/Reviewed Nutrition Discussed  Pharmacy Interventions   Pharmacy Dicussed/Reviewed Pharmacy Topics Discussed  Safety Interventions   Safety Discussed/Reviewed Safety Discussed  [discussed fall prevention strategies. provided positive feedback with paitent regarding how she continues to  work on muscle strength/tone with walking]            SDOH assessments and interventions completed:  No completed recently. No changes.   Care Coordination Interventions:  Yes, provided   Follow up plan: Follow up call scheduled for 03/23/23    Encounter Outcome:  Pt. Visit Completed   Kathyrn Sheriff, RN, MSN, BSN, CCM [facility] Care Coordinator [phone]

## 2023-03-02 DIAGNOSIS — C3491 Malignant neoplasm of unspecified part of right bronchus or lung: Secondary | ICD-10-CM | POA: Diagnosis not present

## 2023-03-10 ENCOUNTER — Encounter (HOSPITAL_COMMUNITY): Payer: Self-pay

## 2023-03-11 ENCOUNTER — Telehealth (HOSPITAL_COMMUNITY): Payer: Self-pay

## 2023-03-11 ENCOUNTER — Encounter (HOSPITAL_COMMUNITY): Payer: Self-pay

## 2023-03-11 DIAGNOSIS — C3431 Malignant neoplasm of lower lobe, right bronchus or lung: Secondary | ICD-10-CM | POA: Diagnosis not present

## 2023-03-11 NOTE — Telephone Encounter (Signed)
Called patient to see if she was interested in participating in the Pulmonary Rehab Program. Patient's daughter stated yes. Patient will come in for orientation on 03/18/23@9am  and will attend the 1015 exercise class.  Sent package.

## 2023-03-16 ENCOUNTER — Ambulatory Visit: Payer: Medicare Other | Admitting: Family Medicine

## 2023-03-16 NOTE — Progress Notes (Unsigned)
[facility] [address], [address] [place], [place] [address] [phone]   Date:  03/18/2023   Name:  [name]   DOB:  03-17-1931   MRN:  [id number]  PCP:  Pearline Cables, MD    Chief Complaint: No chief complaint on file.   History of Present Illness:  [name] is a 89 y.o. very pleasant female patient who presents with the following:  Patient seen today for periodic follow-up Earlier this year she was diagnosed with lung cancer found incidentally on imaging study for rib injury.  Also, history of hypertension, hyperlipidemia, hypothyroidism, breast cancer, more recently some difficulty with paranoia and anxiety  Most recent visit with myself was in May of this year for hospital follow-up, at that time she had been admitted at [facility] for 10 days.  She had a recent right lower lobectomy on 01/20/2023, then was admitted from 4/20 through 02/10/2023 with C. difficile  At her last visit her diarrhea had resolved, appetite was improving We noted hypokalemia on her last labs, resolved on recheck Mild anemia was stable to improving Alkaline phosphatase elevation also improving  Lab Results  Component Value Date   TSH 4.44 07/31/2022    Patient Active Problem List   Diagnosis Date Noted   Pancolitis (HCC) 01/30/2023   Atrial fibrillation (HCC) 01/30/2023   Hydropneumothorax 01/30/2023   Adenocarcinoma of lower lobe of right lung (HCC) 01/30/2023   Rib fractures 11/21/2022   Right lower lobe lung mass 11/21/2022   Autoimmune disease (HCC) 07/31/2021   Barrett's esophagus 07/31/2021   Bone lesion 07/31/2021   Cervical cancer (HCC) 07/31/2021   Dysphagia 07/31/2021   Hypothyroidism 07/31/2021   Chronic midline thoracic back pain 12/31/2020   Paroxysmal SVT (supraventricular tachycardia) 12/23/2020   Intracranial hemorrhage (HCC)    TBI (traumatic brain injury) (HCC) 05/04/2020   Back pain 05/04/2020   SAH  (subarachnoid hemorrhage) (HCC) 04/29/2020   Orbital fracture, closed, initial encounter (HCC) 04/29/2020   Basal cell carcinoma (BCC) in situ of skin 04/04/2020   Essential hypertension 03/21/2019   Stage 3a chronic kidney disease (HCC)    Systolic murmur 02/23/2019   Hypercalcemia    Dizziness 02/22/2019   Squamous cell carcinoma in situ of skin 11/30/2018   PVC (premature ventricular contraction) 09/08/2017   Glaucoma 09/08/2017   Hx of meningioma of the brain 08/28/2017   Hoarseness of voice 02/28/2016   Raynaud's phenomenon 02/28/2016   Esophageal spasm 09/20/2013   Osteoporosis 03/18/2012   Humerus fracture 03/2012   Fracture, shoulder 03/06/2012   Hyperlipidemia    Breast cancer (HCC)     Past Medical History:  Diagnosis Date   Acute blood loss anemia    AKI (acute kidney injury) (HCC)    Autoimmune disease (HCC)    Back pain 05/04/2020   Barrett's esophagus    Basal cell carcinoma (BCC) in situ of skin 04/04/2020   Path 03/2020,  Derm    Bone lesion    sacrum   Breast cancer (HCC) 2010   cT1 N0 M0; ER+/ PR+/ HER2 neg; s/p lumpectomy 07/2010; started adjuvant hormonal therapy 10/2009   Cellulitis of skin 12/31/2020   Cervical cancer (HCC)    Chronic midline thoracic back pain 12/31/2020   Diarrhea 09/08/2017   Dizziness 02/22/2019   Dysphagia    Elevated serum creatinine 09/08/2017   Esophageal spasm 09/20/2013   Essential hypertension 03/21/2019   Fall at home, initial  encounter 04/29/2020   Fracture, shoulder 03/06/2012   Pt seen at [facility] by Dr. Bradly Bienenstock on 03/09/2012 and 03/11/2012.   X-rays taken of left wrist, left elbow, and left shoulder.   Shoulder Fx: nondisplaced proximal humerus fracture involving the greater tuberosity. Wrist: consistent with an old distal radius fracture with shortening present.       Glaucoma    Hoarseness of voice 02/28/2016   Humerus fracture 03/2012   impacted left humueral neck fracture, treated by Dr.  Amanda Pea   Hx of meningioma of the brain 08/28/2017   Stable since 2010- 2018 on MRI   Hypercalcemia    Hyperlipidemia    Hypertension    Hypothyroidism    Intracranial hemorrhage (HCC)    Nausea vomiting and diarrhea 09/08/2017   Orbital fracture, closed, initial encounter (HCC) 04/29/2020   Osteoporosis 03/18/2012   Paroxysmal SVT (supraventricular tachycardia) 12/23/2020   Pneumonia    PVC (premature ventricular contraction) 09/08/2017   Raynaud's phenomenon 02/28/2016   SAH (subarachnoid hemorrhage) (HCC) 04/29/2020   Squamous cell carcinoma in situ of skin 11/30/2018   Path 11/1008, Dermatology managing    Stage 3a chronic kidney disease    Systolic murmur 02/23/2019   TBI (traumatic brain injury) (HCC) 05/04/2020    Past Surgical History:  Procedure Laterality Date   ABDOMINAL HYSTERECTOMY  1972   cancer          1 tube 1 ovary   APPENDECTOMY     BACK SURGERY  11/28/2020   BREAST LUMPECTOMY Left 2010   colon polyp removal     EYE SURGERY     HAND SURGERY     KYPHOPLASTY N/A 11/28/2020   Procedure: KYPHOPLASTY THORACIC TWELVE AND LUMBAR ONE;  Surgeon: Venita Lick, MD;  Location: [facility] OR;  Service: Orthopedics;  Laterality: N/A;  90 mins   ortho procedures     multiple   OVARIAN CYST REMOVAL  1978   SHOULDER SURGERY  1954   left    Social History   Tobacco Use   Smoking status: Former    Types: Cigarettes    Quit date: 12/16/1986    Years since quitting: 36.2   Smokeless tobacco: Never   Tobacco comments:    FORMER SMOKER 30 YEARS AGO  Vaping Use   Vaping Use: Never used  Substance Use Topics   Alcohol use: Yes    Alcohol/week: 0.0 standard drinks of alcohol    Comment: OCCASIONALLY   Drug use: No    Family History  Problem Relation Age of Onset   Heart disease Father    Cancer Sister        lung, kidney   Cancer Brother        brain   Cancer Paternal Aunt        breast   Breast cancer Paternal Aunt    Cancer Brother        liver, lung   Osteoporosis  Neg Hx     Allergies  Allergen Reactions   Daypro [Oxaprozin] Swelling    Face and tongue   Prolia [Denosumab]     unknown    Medication list has been reviewed and updated.  Current Outpatient Medications on File Prior to Visit  Medication Sig Dispense Refill   acetaminophen (TYLENOL) 500 MG tablet Take 2 tablets (1,000 mg total) by mouth 3 (three) times daily as needed for mild pain.     amLODipine (NORVASC) 2.5 MG tablet Take 1 tablet (2.5 mg total) by mouth  daily. 90 tablet 3   Calcium Carbonate-Vitamin D (CALCIUM + D PO) Take 1 capsule by mouth daily.      cloNIDine (CATAPRES) 0.1 MG tablet Take 1 tablet (0.1 mg total) by mouth daily. Use if needed for vocal tic (Patient not taking: Reported on 02/25/2023) 30 tablet 3   latanoprost (XALATAN) 0.005 % ophthalmic solution Place 1 drop into both eyes at bedtime.      levothyroxine (SYNTHROID) 75 MCG tablet TAKE 1 TABLET BY MOUTH DAILY BEFORE BREAKFAST (Patient taking differently: Take 75 mcg by mouth daily before breakfast. TAKE 1 TABLET BY MOUTH DAILY BEFORE BREAKFAST) 90 tablet 3   metoprolol tartrate (LOPRESSOR) 25 MG tablet Take 25 mg by mouth 2 (two) times daily.     metroNIDAZOLE (FLAGYL) 500 MG/100ML Inject 100 mLs (500 mg total) into the vein every 8 (eight) hours. (Patient not taking: Reported on 02/25/2023)     Multiple Vitamin (MULTIVITAMIN WITH MINERALS) TABS tablet Take 1 tablet by mouth daily. One a day (Patient not taking: Reported on 02/25/2023)     ondansetron (ZOFRAN) 4 MG tablet Take 1 tablet (4 mg total) by mouth every 6 (six) hours as needed for nausea. (Patient not taking: Reported on 02/25/2023) 20 tablet 0   oxyCODONE (OXY IR/ROXICODONE) 5 MG immediate release tablet Take 1 tablet (5 mg total) by mouth every 4 (four) hours as needed for moderate pain. (Patient not taking: Reported on 02/25/2023) 30 tablet 0   rosuvastatin (CRESTOR) 10 MG tablet Take 1 tablet (10 mg total) by mouth daily. TAKE 1 BY MOUTH DAILY 90 tablet 3    vancomycin (VANCOCIN) 250 MG capsule Take 2 capsules (500 mg total) by mouth every 6 (six) hours. (Patient not taking: Reported on 02/25/2023)     No current facility-administered medications on file prior to visit.    Review of Systems:  As per HPI- otherwise negative.   Physical Examination: There were no vitals filed for this visit. There were no vitals filed for this visit. There is no height or weight on file to calculate BMI. Ideal Body Weight:    GEN: no acute distress. HEENT: Atraumatic, Normocephalic.  Ears and Nose: No external deformity. CV: RRR, No M/G/R. No JVD. No thrill. No extra heart sounds. PULM: CTA B, no wheezes, crackles, rhonchi. No retractions. No resp. distress. No accessory muscle use. ABD: S, NT, ND, +BS. No rebound. No HSM. EXTR: No c/c/e PSYCH: Normally interactive. Conversant.    Assessment and Plan: ***  Signed Abbe Amsterdam, MD

## 2023-03-16 NOTE — Patient Instructions (Incomplete)
It was good to see you again today, I hope you continue to feel better!  I will be in touch with your labs and your x-rays asap  Assuming all is well please see me in 4-6 months

## 2023-03-17 ENCOUNTER — Telehealth (HOSPITAL_COMMUNITY): Payer: Self-pay

## 2023-03-17 NOTE — Telephone Encounter (Signed)
Called pt to confirm appt for pulmonary rehab orientation on 6/5. Pt did not answer. VM left.

## 2023-03-18 ENCOUNTER — Ambulatory Visit (HOSPITAL_BASED_OUTPATIENT_CLINIC_OR_DEPARTMENT_OTHER)
Admission: RE | Admit: 2023-03-18 | Discharge: 2023-03-18 | Disposition: A | Discharge status: Home / Self Care | Payer: Medicare Other | Source: Ambulatory Visit | Attending: Family Medicine | Admitting: Family Medicine

## 2023-03-18 ENCOUNTER — Encounter (HOSPITAL_COMMUNITY)
Admission: RE | Admit: 2023-03-18 | Discharge: 2023-03-18 | Disposition: A | Discharge status: Home / Self Care | Payer: Medicare Other | Source: Ambulatory Visit | Attending: Pulmonary Disease | Admitting: Pulmonary Disease

## 2023-03-18 ENCOUNTER — Ambulatory Visit: Payer: Medicare Other | Admitting: Family Medicine

## 2023-03-18 ENCOUNTER — Encounter: Payer: Self-pay | Admitting: Family Medicine

## 2023-03-18 ENCOUNTER — Ambulatory Visit (INDEPENDENT_AMBULATORY_CARE_PROVIDER_SITE_OTHER): Payer: Medicare Other | Admitting: Family Medicine

## 2023-03-18 VITALS — BP 108/60 | HR 76 | Temp 97.7°F | Resp 18 | Ht 64.0 in | Wt 103.2 lb

## 2023-03-18 VITALS — BP 130/80 | HR 70 | Ht 59.0 in | Wt 104.3 lb

## 2023-03-18 DIAGNOSIS — E782 Mixed hyperlipidemia: Secondary | ICD-10-CM | POA: Diagnosis not present

## 2023-03-18 DIAGNOSIS — C3431 Malignant neoplasm of lower lobe, right bronchus or lung: Secondary | ICD-10-CM | POA: Insufficient documentation

## 2023-03-18 DIAGNOSIS — I10 Essential (primary) hypertension: Secondary | ICD-10-CM

## 2023-03-18 DIAGNOSIS — M546 Pain in thoracic spine: Secondary | ICD-10-CM | POA: Diagnosis not present

## 2023-03-18 DIAGNOSIS — M545 Low back pain, unspecified: Secondary | ICD-10-CM

## 2023-03-18 DIAGNOSIS — E039 Hypothyroidism, unspecified: Secondary | ICD-10-CM | POA: Diagnosis not present

## 2023-03-18 DIAGNOSIS — C3492 Malignant neoplasm of unspecified part of left bronchus or lung: Secondary | ICD-10-CM

## 2023-03-18 DIAGNOSIS — M4316 Spondylolisthesis, lumbar region: Secondary | ICD-10-CM | POA: Diagnosis not present

## 2023-03-18 DIAGNOSIS — S32040A Wedge compression fracture of fourth lumbar vertebra, initial encounter for closed fracture: Secondary | ICD-10-CM | POA: Diagnosis not present

## 2023-03-18 NOTE — Progress Notes (Signed)
Brenlee arrived today in [facility]. Weight and VS taken. We discussed the program and Rainy opted not to do rehab. I told her I would cancel her appt and close her file. She agreed.

## 2023-03-19 ENCOUNTER — Encounter: Payer: Self-pay | Admitting: Family Medicine

## 2023-03-19 ENCOUNTER — Telehealth: Payer: Self-pay | Admitting: *Deleted

## 2023-03-19 LAB — COMPREHENSIVE METABOLIC PANEL
ALT: 9 U/L (ref 0–35)
AST: 18 U/L (ref 0–37)
Albumin: 3.9 g/dL (ref 3.5–5.2)
Alkaline Phosphatase: 99 U/L (ref 39–117)
BUN: 21 mg/dL (ref 6–23)
CO2: 27 mEq/L (ref 19–32)
Calcium: 9.7 mg/dL (ref 8.4–10.5)
Chloride: 103 mEq/L (ref 96–112)
Creatinine, Ser: 1.03 mg/dL (ref 0.40–1.20)
GFR: 48.06 mL/min — ABNORMAL LOW (ref >60.00)
Glucose, Bld: 84 mg/dL (ref 70–99)
Potassium: 4.8 mEq/L (ref 3.5–5.1)
Sodium: 141 mEq/L (ref 135–145)
Total Bilirubin: 0.3 mg/dL (ref 0.2–1.2)
Total Protein: 6.7 g/dL (ref 6.0–8.3)

## 2023-03-19 LAB — TSH: TSH: 2.35 u[IU]/mL (ref 0.35–5.50)

## 2023-03-19 LAB — CBC
HCT: 36.8 % (ref 36.0–46.0)
Hemoglobin: 11.6 g/dL — ABNORMAL LOW (ref 12.0–15.0)
MCHC: 31.4 g/dL (ref 30.0–36.0)
MCV: 94.9 fl (ref 78.0–100.0)
Platelets: 337 10*3/uL (ref 150.0–400.0)
RBC: 3.88 Mil/uL (ref 3.87–5.11)
RDW: 15.9 % — ABNORMAL HIGH (ref 11.5–15.5)
WBC: 9 10*3/uL (ref 4.0–10.5)

## 2023-03-19 NOTE — Progress Notes (Signed)
  Care Coordination Note  03/19/2023 Name: [name] MRN: [id number] DOB: 26-Jul-1931  [name] is a 89 y.o. year old female who is a primary care patient of Copland, [name], MD and is actively engaged with the care management team. [name] made an inbound call by phone today to assist with  canceling   a follow up visit with the RN Case Manager  Follow up plan: Patient declines further follow up and engagement by the care management team. Appropriate care team members and provider have been notified via electronic communication.   [facility]  Care Coordination Care Guide  Direct Dial: [phone]

## 2023-03-23 ENCOUNTER — Telehealth: Payer: Self-pay | Admitting: Family Medicine

## 2023-03-23 ENCOUNTER — Other Ambulatory Visit: Payer: Self-pay

## 2023-03-23 DIAGNOSIS — E782 Mixed hyperlipidemia: Secondary | ICD-10-CM

## 2023-03-23 DIAGNOSIS — I10 Essential (primary) hypertension: Secondary | ICD-10-CM

## 2023-03-23 MED ORDER — ROSUVASTATIN CALCIUM 10 MG PO TABS
10.0000 mg | ORAL_TABLET | Freq: Every day | ORAL | 3 refills | Status: DC
Start: 2023-03-23 — End: 2023-12-31

## 2023-03-23 MED ORDER — AMLODIPINE BESYLATE 2.5 MG PO TABS
2.5000 mg | ORAL_TABLET | Freq: Every day | ORAL | 3 refills | Status: DC
Start: 2023-03-23 — End: 2023-11-06

## 2023-03-23 NOTE — Telephone Encounter (Signed)
Prescription Request  03/23/2023  Is this a "Controlled Substance" medicine? No  LOV: 03/18/2023  What is the name of the medication or equipment?  amLODipine (NORVASC) 2.5 MG tablet   rosuvastatin (CRESTOR) 10 MG tablet   Have you contacted your pharmacy to request a refill? Yes   Which pharmacy would you like this sent to?   [facility] - [place], [place] - [address] [place] [address] Phone: [phone] Fax: [phone]    Patient notified that their request is being sent to the clinical staff for review and that they should receive a response within 2 business days.   Please advise at Mobile [phone] (mobile)

## 2023-03-23 NOTE — Telephone Encounter (Signed)
Refills have been sent in. 

## 2023-03-24 ENCOUNTER — Ambulatory Visit (HOSPITAL_COMMUNITY): Payer: Medicare Other

## 2023-03-26 ENCOUNTER — Encounter: Payer: Self-pay | Admitting: Family Medicine

## 2023-03-26 ENCOUNTER — Ambulatory Visit (HOSPITAL_COMMUNITY): Payer: Medicare Other

## 2023-03-27 ENCOUNTER — Ambulatory Visit (INDEPENDENT_AMBULATORY_CARE_PROVIDER_SITE_OTHER): Payer: Medicare Other | Admitting: *Deleted

## 2023-03-27 DIAGNOSIS — Z0000 Encounter for general adult medical examination without abnormal findings: Secondary | ICD-10-CM | POA: Diagnosis not present

## 2023-03-27 DIAGNOSIS — H401132 Primary open-angle glaucoma, bilateral, moderate stage: Secondary | ICD-10-CM | POA: Diagnosis not present

## 2023-03-27 DIAGNOSIS — H43811 Vitreous degeneration, right eye: Secondary | ICD-10-CM | POA: Diagnosis not present

## 2023-03-27 DIAGNOSIS — H4322 Crystalline deposits in vitreous body, left eye: Secondary | ICD-10-CM | POA: Diagnosis not present

## 2023-03-27 DIAGNOSIS — Z961 Presence of intraocular lens: Secondary | ICD-10-CM | POA: Diagnosis not present

## 2023-03-27 NOTE — Patient Instructions (Signed)
[name] , Thank you for taking time to come for your Medicare Wellness Visit. I appreciate your ongoing commitment to your health goals. Please review the following plan we discussed and let me know if I can assist you in the future.      This is a list of the screening recommended for you and due dates:  Health Maintenance  Topic Date Due   COVID-19 Vaccine (5 - 2023-24 season) 09/25/2022   Flu Shot  05/14/2023   Medicare Annual Wellness Visit  03/26/2024   DTaP/Tdap/Td vaccine (2 - Tdap) 03/03/2027   Pneumonia Vaccine  Completed   DEXA scan (bone density measurement)  Completed   Zoster (Shingles) Vaccine  Completed   HPV Vaccine  Aged Out    Next appointment: Follow up in one year for your annual wellness visit.   Preventive Care 87 Years and Older, Female Preventive care refers to lifestyle choices and visits with your health care provider that can promote health and wellness. What does preventive care include? A yearly physical exam. This is also called an annual well check. Dental exams once or twice a year. Routine eye exams. Ask your health care provider how often you should have your eyes checked. Personal lifestyle choices, including: Daily care of your teeth and gums. Regular physical activity. Eating a healthy diet. Avoiding tobacco and drug use. Limiting alcohol use. Practicing safe sex. Taking low-dose aspirin every day. Taking vitamin and mineral supplements as recommended by your health care provider. What happens during an annual well check? The services and screenings done by your health care provider during your annual well check will depend on your age, overall health, lifestyle risk factors, and family history of disease. Counseling  Your health care provider may ask you questions about your: Alcohol use. Tobacco use. Drug use. Emotional well-being. Home and relationship well-being. Sexual activity. Eating habits. History of falls. Memory and  ability to understand (cognition). Work and work Astronomer. Reproductive health. Screening  You may have the following tests or measurements: Height, weight, and BMI. Blood pressure. Lipid and cholesterol levels. These may be checked every 5 years, or more frequently if you are over 36 years old. Skin check. Lung cancer screening. You may have this screening every year starting at age 87 if you have a 30-pack-year history of smoking and currently smoke or have quit within the past 15 years. Fecal occult blood test (FOBT) of the stool. You may have this test every year starting at age 87. Flexible sigmoidoscopy or colonoscopy. You may have a sigmoidoscopy every 5 years or a colonoscopy every 10 years starting at age 87. Hepatitis C blood test. Hepatitis B blood test. Sexually transmitted disease (STD) testing. Diabetes screening. This is done by checking your blood sugar (glucose) after you have not eaten for a while (fasting). You may have this done every 1-3 years. Bone density scan. This is done to screen for osteoporosis. You may have this done starting at age 87. Mammogram. This may be done every 1-2 years. Talk to your health care provider about how often you should have regular mammograms. Talk with your health care provider about your test results, treatment options, and if necessary, the need for more tests. Vaccines  Your health care provider may recommend certain vaccines, such as: Influenza vaccine. This is recommended every year. Tetanus, diphtheria, and acellular pertussis (Tdap, Td) vaccine. You may need a Td booster every 10 years. Zoster vaccine. You may need this after age 87. Pneumococcal 13-valent  conjugate (PCV13) vaccine. One dose is recommended after age 87. Pneumococcal polysaccharide (PPSV23) vaccine. One dose is recommended after age 87. Talk to your health care provider about which screenings and vaccines you need and how often you need them. This information is  not intended to replace advice given to you by your health care provider. Make sure you discuss any questions you have with your health care provider. Document Released: 10/26/2015 Document Revised: 06/18/2016 Document Reviewed: 07/31/2015 Elsevier Interactive Patient Education  2017 [employer] Prevention in the Home Falls can cause injuries. They can happen to people of all ages. There are many things you can do to make your home safe and to help prevent falls. What can I do on the outside of my home? Regularly fix the edges of walkways and driveways and fix any cracks. Remove anything that might make you trip as you walk through a door, such as a raised step or threshold. Trim any bushes or trees on the path to your home. Use bright outdoor lighting. Clear any walking paths of anything that might make someone trip, such as rocks or tools. Regularly check to see if handrails are loose or broken. Make sure that both sides of any steps have handrails. Any raised decks and porches should have guardrails on the edges. Have any leaves, snow, or ice cleared regularly. Use sand or salt on walking paths during winter. Clean up any spills in your garage right away. This includes oil or grease spills. What can I do in the bathroom? Use night lights. Install grab bars by the toilet and in the tub and shower. Do not use towel bars as grab bars. Use non-skid mats or decals in the tub or shower. If you need to sit down in the shower, use a plastic, non-slip stool. Keep the floor dry. Clean up any water that spills on the floor as soon as it happens. Remove soap buildup in the tub or shower regularly. Attach bath mats securely with double-sided non-slip rug tape. Do not have throw rugs and other things on the floor that can make you trip. What can I do in the bedroom? Use night lights. Make sure that you have a light by your bed that is easy to reach. Do not use any sheets or blankets that  are too big for your bed. They should not hang down onto the floor. Have a firm chair that has side arms. You can use this for support while you get dressed. Do not have throw rugs and other things on the floor that can make you trip. What can I do in the kitchen? Clean up any spills right away. Avoid walking on wet floors. Keep items that you use a lot in easy-to-reach places. If you need to reach something above you, use a strong step stool that has a grab bar. Keep electrical cords out of the way. Do not use floor polish or wax that makes floors slippery. If you must use wax, use non-skid floor wax. Do not have throw rugs and other things on the floor that can make you trip. What can I do with my stairs? Do not leave any items on the stairs. Make sure that there are handrails on both sides of the stairs and use them. Fix handrails that are broken or loose. Make sure that handrails are as long as the stairways. Check any carpeting to make sure that it is firmly attached to the stairs. Fix any carpet that  is loose or worn. Avoid having throw rugs at the top or bottom of the stairs. If you do have throw rugs, attach them to the floor with carpet tape. Make sure that you have a light switch at the top of the stairs and the bottom of the stairs. If you do not have them, ask someone to add them for you. What else can I do to help prevent falls? Wear shoes that: Do not have high heels. Have rubber bottoms. Are comfortable and fit you well. Are closed at the toe. Do not wear sandals. If you use a stepladder: Make sure that it is fully opened. Do not climb a closed stepladder. Make sure that both sides of the stepladder are locked into place. Ask someone to hold it for you, if possible. Clearly mark and make sure that you can see: Any grab bars or handrails. First and last steps. Where the edge of each step is. Use tools that help you move around (mobility aids) if they are needed. These  include: Canes. Walkers. Scooters. Crutches. Turn on the lights when you go into a dark area. Replace any light bulbs as soon as they burn out. Set up your furniture so you have a clear path. Avoid moving your furniture around. If any of your floors are uneven, fix them. If there are any pets around you, be aware of where they are. Review your medicines with your doctor. Some medicines can make you feel dizzy. This can increase your chance of falling. Ask your doctor what other things that you can do to help prevent falls. This information is not intended to replace advice given to you by your health care provider. Make sure you discuss any questions you have with your health care provider. Document Released: 07/26/2009 Document Revised: 03/06/2016 Document Reviewed: 11/03/2014 Elsevier Interactive Patient Education  2017 [employer].

## 2023-03-27 NOTE — Progress Notes (Signed)
Subjective:   [name] is a 89 y.o. female who presents for Medicare Annual (Subsequent) preventive examination.  I connected with  [name] on 03/27/23 by a audio enabled telemedicine application and verified that I am speaking with the correct person using two identifiers.  Patient Location: Home  Provider Location: Office/Clinic  I discussed the limitations of evaluation and management by telemedicine. The patient expressed understanding and agreed to proceed.   Review of Systems     Cardiac Risk Factors include: advanced age (>34men, >23 women);hypertension;dyslipidemia     Objective:    Today's Vitals   There is no height or weight on file to calculate BMI.     03/27/2023    3:34 PM 11/21/2022   12:12 PM 01/16/2022   10:23 AM 09/30/2021    6:41 PM 01/10/2021   11:04 AM 12/21/2020    8:03 AM 12/13/2020    8:37 AM  Advanced Directives  Does Patient Have a Medical Advance Directive? Yes Yes Yes Yes No No No  Type of Estate agent of [place];Living will Healthcare Power of [place];Living will Healthcare Power of [place];Out of facility DNR (pink MOST or yellow form);Living will Healthcare Power of Attorney     Does patient want to make changes to medical advance directive?  No - Patient declined  Yes (ED - Information included in AVS)     Copy of Healthcare Power of Attorney in Chart? Yes - validated most recent copy scanned in chart (See row information) No - copy requested Yes - validated most recent copy scanned in chart (See row information) No - copy requested     Would patient like information on creating a medical advance directive?     No - Patient declined No - Patient declined     Current Medications (verified) Outpatient Encounter Medications as of 03/27/2023  Medication Sig   acetaminophen (TYLENOL) 500 MG tablet Take 2 tablets (1,000 mg total) by mouth 3 (three) times daily as needed for mild pain.   amLODipine (NORVASC) 2.5 MG  tablet Take 1 tablet (2.5 mg total) by mouth daily.   Calcium Carbonate-Vitamin D (CALCIUM + D PO) Take 1 capsule by mouth daily.    cloNIDine (CATAPRES) 0.1 MG tablet Take 1 tablet (0.1 mg total) by mouth daily. Use if needed for vocal tic   latanoprost (XALATAN) 0.005 % ophthalmic solution Place 1 drop into both eyes at bedtime.    levothyroxine (SYNTHROID) 75 MCG tablet TAKE 1 TABLET BY MOUTH DAILY BEFORE BREAKFAST (Patient taking differently: Take 75 mcg by mouth daily before breakfast. TAKE 1 TABLET BY MOUTH DAILY BEFORE BREAKFAST)   metoprolol tartrate (LOPRESSOR) 25 MG tablet Take 25 mg by mouth 2 (two) times daily.   rosuvastatin (CRESTOR) 10 MG tablet Take 1 tablet (10 mg total) by mouth daily. TAKE 1 BY MOUTH DAILY   No facility-administered encounter medications on file as of 03/27/2023.    Allergies (verified) Daypro [oxaprozin] and Prolia [denosumab]   History: Past Medical History:  Diagnosis Date   Acute blood loss anemia    AKI (acute kidney injury) (HCC)    Autoimmune disease (HCC)    Back pain 05/04/2020   Barrett's esophagus    Basal cell carcinoma (BCC) in situ of skin 04/04/2020   Path 03/2020, [facility]    Bone lesion    sacrum   Breast cancer (HCC) 2010   cT1 N0 M0; ER+/ PR+/ HER2 neg; s/p lumpectomy 07/2010; started adjuvant hormonal therapy  10/2009   Cellulitis of skin 12/31/2020   Cervical cancer [facility])    Chronic midline thoracic back pain 12/31/2020   Diarrhea 09/08/2017   Dizziness 02/22/2019   Dysphagia    Elevated serum creatinine 09/08/2017   Esophageal spasm 09/20/2013   Essential hypertension 03/21/2019   Fall at home, initial encounter 04/29/2020   Fracture, shoulder 03/06/2012   Pt seen at [facility] by Dr. Bradly Bienenstock on 03/09/2012 and 03/11/2012.   X-rays taken of left wrist, left elbow, and left shoulder.   Shoulder Fx: nondisplaced proximal humerus fracture involving the greater tuberosity. Wrist: consistent with an old  distal radius fracture with shortening present.       Glaucoma    Hoarseness of voice 02/28/2016   Humerus fracture 03/2012   impacted left humueral neck fracture, treated by Dr. Amanda Pea   Hx of meningioma of the brain 08/28/2017   Stable since 2010- 2018 on MRI   Hypercalcemia    Hyperlipidemia    Hypertension    Hypothyroidism    Intracranial hemorrhage (HCC)    Nausea vomiting and diarrhea 09/08/2017   Orbital fracture, closed, initial encounter (HCC) 04/29/2020   Osteoporosis 03/18/2012   Paroxysmal SVT (supraventricular tachycardia) 12/23/2020   Pneumonia    PVC (premature ventricular contraction) 09/08/2017   Raynaud's phenomenon 02/28/2016   SAH (subarachnoid hemorrhage) (HCC) 04/29/2020   Squamous cell carcinoma in situ of skin 11/30/2018   Path 11/1008, D[facility]atology managing    Stage 3a chronic kidney disease    Status post partial lobectomy of lung 01/20/2023   lower right lobe   Systolic murmur 02/23/2019   TBI (traumatic brain injury) (HCC) 05/04/2020   Past Surgical History:  Procedure Laterality Date   ABDOMINAL HYSTERECTOMY  1972   cancer          1 tube 1 ovary   APPENDECTOMY     BACK SURGERY  11/28/2020   BREAST LUMPECTOMY Left 2010   colon polyp removal     EYE SURGERY     HAND SURGERY     KYPHOPLASTY N/A 11/28/2020   Procedure: KYPHOPLASTY THORACIC TWELVE AND LUMBAR ONE;  Surgeon: Venita Lick, MD;  Location: [facility] OR;  Service: Orthopedics;  Laterality: N/A;  90 mins   LOBECTOMY Right 01/20/2023   right lower lobe   ortho procedures     multiple   OVARIAN CYST REMOVAL  1978   SHOULDER SURGERY  1954   left   Family History  Problem Relation Age of Onset   Heart disease Father    Cancer Sister        lung, kidney   Cancer Brother        brain   Cancer Paternal Aunt        breast   Breast cancer Paternal Aunt    Cancer Brother        liver, lung   Osteoporosis Neg Hx    Social History   Socioeconomic History   Marital status: Married     Spouse name: Not on file   Number of children: 2   Years of education: 12   Highest education level: Not on file  Occupational History   Occupation: retired  Tobacco Use   Smoking status: Former    Types: Cigarettes    Quit date: 12/16/1986    Years since quitting: 36.3   Smokeless tobacco: Never   Tobacco comments:    FORMER SMOKER 30 YEARS AGO  Vaping Use   Vaping Use: Never used  Substance and Sexual Activity   Alcohol use: Yes    Alcohol/week: 0.0 standard drinks of alcohol    Comment: OCCASIONALLY   Drug use: No   Sexual activity: Not on file  Other Topics Concern   Not on file  Social History Narrative   Married   Exercise: Yes   Patient lives with husband in a one story home.  Has 2 daughters.  Retired from working in Marine scientist.  Education: high school.     Right handed    Social Determinants of Health   Financial Resource Strain: Low Risk  (01/16/2022)   Overall Financial Resource Strain (CARDIA)    Difficulty of Paying Living Expenses: Not hard at all  Food Insecurity: No Food Insecurity (02/11/2023)   Hunger Vital Sign    Worried About Running Out of Food in the Last Year: Never true    Ran Out of Food in the Last Year: Never true  Transportation Needs: No Transportation Needs (03/27/2023)   PRAPARE - Administrator, Civil Service (Medical): No    Lack of Transportation (Non-Medical): No  Physical Activity: Inactive (01/10/2021)   Exercise Vital Sign    Days of Exercise per Week: 0 days    Minutes of Exercise per Session: 0 min  Stress: No Stress Concern Present (01/16/2022)   [employer] of Occupational Health - Occupational Stress Questionnaire    Feeling of Stress : Not at all  Social Connections: Socially Integrated (01/16/2022)   Social Connection and Isolation Panel [NHANES]    Frequency of Communication with Friends and Family: More than three times a week    Frequency of Social Gatherings with Friends and Family: Never    Attends Religious  Services: More than 4 times per year    Active Member of [employer] or Organizations: Yes    Attends Banker Meetings: Never    Marital Status: Married    Tobacco Counseling Counseling given: Not Answered Tobacco comments: FORMER SMOKER 30 YEARS AGO   Clinical Intake:  Pre-visit preparation completed: Yes  Pain : No/denies pain     Nutritional Risks: None Diabetes: No  How often do you need to have someone help you when you read instructions, pamphlets, or other written materials from your doctor or pharmacy?: 1 - Never  Activities of Daily Living    03/27/2023    3:44 PM  In your present state of health, do you have any difficulty performing the following activities:  Hearing? 0  Vision? 0  Difficulty concentrating or making decisions? 0  Walking or climbing stairs? 0  Dressing or bathing? 0  Doing errands, shopping? 1  Comment husband Insurance claims handler and eating ? N  Using the Toilet? N  In the past six months, have you accidently leaked urine? N  Do you have problems with loss of bowel control? N  Managing your Medications? N  Managing your Finances? N  Housekeeping or managing your Housekeeping? N    Patient Care Team: Copland, [name], MD as PCP - General (Family Medicine) Jodelle Red, MD as PCP - Cardiology (Cardiology) [place], Peter M, MD (Cardiology) Dominica Severin, MD (Orthopedic Surgery) Alanson Puls, MD (Dermatology) Glendale Chard, DO as Consulting Physician (Neurology)  Indicate any recent Medical Services you may have received from other than [facility] providers in the past year (date may be approximate).     Assessment:   This is a routine wellness examination for [place].  Hearing/Vision screen No results found.  Dietary issues and exercise activities discussed: Current Exercise Habits: Home exercise routine, Type of exercise: walking, Time (Minutes): 40, Frequency (Times/Week): 7, Weekly Exercise (Minutes/Week):  280, Intensity: Mild, Exercise limited by: None identified   Goals Addressed   None    Depression Screen    03/27/2023    3:42 PM 12/03/2022    1:14 PM 01/29/2022    8:37 AM 01/16/2022   10:23 AM 09/30/2021    8:46 AM 04/16/2021    3:00 PM 01/10/2021   11:07 AM  PHQ 2/9 Scores  PHQ - 2 Score 0 0 0 0 0 0 1    Fall Risk    03/27/2023    3:39 PM 03/18/2023    2:28 PM 12/03/2022    1:13 PM 01/29/2022    8:36 AM 01/16/2022   10:23 AM  Fall Risk   Falls in the past year? 1 1 1  0 0  Comment fell in bathroom and fractured 3 ribs      Number falls in past yr: 0 0 0 0 0  Injury with Fall? 1 1 1  0 0  Comment  broke ribs in february     Risk for fall due to : History of fall(s) History of fall(s) History of fall(s)  No Fall Risks  Follow up Falls evaluation completed  Falls evaluation completed  Falls evaluation completed    FALL RISK PREVENTION PERTAINING TO THE HOME:  Any stairs in or around the home? Yes  If so, are there any without handrails? No  Home free of loose throw rugs in walkways, pet beds, electrical cords, etc? Yes  Adequate lighting in your home to reduce risk of falls? Yes   ASSISTIVE DEVICES UTILIZED TO PREVENT FALLS:  Life alert? No  Use of a cane, walker or w/c? No  Grab bars in the bathroom? Yes  Shower chair or bench in shower? Yes  Elevated toilet seat or a handicapped toilet?  Comfort hight  TIMED UP AND GO:  Was the test performed?  No, audio visit .    Cognitive Function:        03/27/2023    3:49 PM 01/16/2022   10:27 AM  6CIT Screen  What Year? 0 points 0 points  What month? 0 points 0 points  What time? 0 points 0 points  Count back from 20 0 points 0 points  Months in reverse 0 points 0 points  Repeat phrase 0 points 4 points  Total Score 0 points 4 points    Immunizations Immunization History  Administered Date(s) Administered   COVID-19, mRNA, vaccine(Comirnaty)12 years and older 07/31/2022   Fluad Quad(high Dose 65+) 07/04/2019,  07/26/2020, 07/01/2021, 07/31/2022   Influenza Split 06/22/2012   Influenza, High Dose Seasonal PF 07/15/2016, 08/17/2017, 07/14/2018   Influenza,inj,Quad PF,6+ Mos 07/13/2013, 07/13/2014, 07/16/2015   Janssen (J&J) SARS-COV-2 Vaccination 01/16/2020   PFIZER(Purple Top)SARS-COV-2 Vaccination 09/09/2020   Pfizer Covid-19 Vaccine Bivalent Booster 37yrs & up 09/30/2021   Pneumococcal Conjugate-13 05/30/2014   Pneumococcal Polysaccharide-23 09/30/2021   Pneumococcal-Unspecified 05/13/2004   Td 03/02/2017   Zoster Recombinat (Shingrix) 08/03/2018, 10/02/2018    TDAP status: Up to date  Flu Vaccine status: Up to date  Pneumococcal vaccine status: Up to date  Covid-19 vaccine status: Information provided on how to obtain vaccines.   Qualifies for Shingles Vaccine? Yes   Zostavax completed No   Shingrix Completed?: Yes  Screening Tests Health Maintenance  Topic Date Due   COVID-19 Vaccine (5 - 2023-24 season) 09/25/2022  INFLUENZA VACCINE  05/14/2023   Medicare Annual Wellness (AWV)  03/26/2024   DTaP/Tdap/Td (2 - Tdap) 03/03/2027   Pneumonia Vaccine 45+ Years old  Completed   DEXA SCAN  Completed   Zoster Vaccines- Shingrix  Completed   HPV VACCINES  Aged Out    Health Maintenance  Health Maintenance Due  Topic Date Due   COVID-19 Vaccine (5 - 2023-24 season) 09/25/2022    Colorectal cancer screening: No longer required.   Mammogram status: Completed 09/15/22. Repeat every year  Bone Density status: Completed 09/15/22. Results reflect: Bone density results: OSTEOPOROSIS. Repeat every 2 years.  Lung Cancer Screening: (Low Dose CT Chest recommended if Age 43-80 years, 30 pack-year currently smoking OR have quit w/in 15years.) does not qualify.   Additional Screening:  Hepatitis C Screening: does not qualify  Vision Screening: Recommended annual ophthalmology exams for early detection of glaucoma and other disorders of the eye. Is the patient up to date with their  annual eye exam?  Yes  Who is the provider or what is the name of the office in which the patient attends annual eye exams? Dr. Sherrine Maples [facility]. If pt is not established with a provider, would they like to be referred to a provider to establish care? No .   Dental Screening: Recommended annual dental exams for proper oral hygiene  Community Resource Referral / Chronic Care Management: CRR required this visit?  No   CCM required this visit?  No      Plan:     I have personally reviewed and noted the following in the patient's chart:   Medical and social history Use of alcohol, tobacco or illicit drugs  Current medications and supplements including opioid prescriptions. Patient is not currently taking opioid prescriptions. Functional ability and status Nutritional status Physical activity Advanced directives List of other physicians Hospitalizations, surgeries, and ER visits in previous 12 months Vitals Screenings to include cognitive, depression, and falls Referrals and appointments  In addition, I have reviewed and discussed with patient certain preventive protocols, quality metrics, and best practice recommendations. A written personalized care plan for preventive services as well as general preventive health recommendations were provided to patient.   Due to this being a telephonic visit, the after visit summary with patients personalized plan was offered to patient via mail or my-chart.  Patient would like to access on my-chart.  Donne Anon, [place]   03/27/2023   Nurse Notes: None

## 2023-03-31 ENCOUNTER — Ambulatory Visit (HOSPITAL_COMMUNITY): Payer: Medicare Other

## 2023-04-02 ENCOUNTER — Ambulatory Visit (HOSPITAL_COMMUNITY): Payer: Medicare Other

## 2023-04-07 ENCOUNTER — Ambulatory Visit (HOSPITAL_COMMUNITY): Payer: Medicare Other

## 2023-04-09 ENCOUNTER — Ambulatory Visit (HOSPITAL_COMMUNITY): Payer: Medicare Other

## 2023-04-14 ENCOUNTER — Ambulatory Visit (HOSPITAL_COMMUNITY): Payer: Medicare Other

## 2023-04-15 DIAGNOSIS — C3491 Malignant neoplasm of unspecified part of right bronchus or lung: Secondary | ICD-10-CM | POA: Diagnosis not present

## 2023-04-15 DIAGNOSIS — Z79899 Other long term (current) drug therapy: Secondary | ICD-10-CM | POA: Diagnosis not present

## 2023-04-15 DIAGNOSIS — L270 Generalized skin eruption due to drugs and medicaments taken internally: Secondary | ICD-10-CM | POA: Diagnosis not present

## 2023-04-21 ENCOUNTER — Ambulatory Visit (HOSPITAL_COMMUNITY): Payer: Medicare Other

## 2023-04-23 ENCOUNTER — Ambulatory Visit (HOSPITAL_COMMUNITY): Payer: Medicare Other

## 2023-04-28 ENCOUNTER — Ambulatory Visit (HOSPITAL_COMMUNITY): Payer: Medicare Other

## 2023-04-30 ENCOUNTER — Ambulatory Visit (HOSPITAL_COMMUNITY): Payer: Medicare Other

## 2023-05-05 ENCOUNTER — Ambulatory Visit (HOSPITAL_COMMUNITY): Payer: Medicare Other

## 2023-05-07 ENCOUNTER — Ambulatory Visit (HOSPITAL_COMMUNITY): Payer: Medicare Other

## 2023-05-12 ENCOUNTER — Ambulatory Visit (HOSPITAL_COMMUNITY): Payer: Medicare Other

## 2023-05-14 ENCOUNTER — Ambulatory Visit (HOSPITAL_COMMUNITY): Payer: Medicare Other

## 2023-05-15 DIAGNOSIS — K080 Exfoliation of teeth due to systemic causes: Secondary | ICD-10-CM | POA: Diagnosis not present

## 2023-05-18 ENCOUNTER — Ambulatory Visit: Payer: Self-pay

## 2023-05-18 NOTE — Patient Outreach (Signed)
  Care Coordination   Closure  Visit Note   05/18/2023 Name: [name] MRN: [id number] DOB: May 21, 1931  [name] is a 89 y.o. year old female who sees Copland, [name], MD for primary care.  No patient contact was made during this encounter  What matters to the patients health and wellness today?  RNCM received message that Patient declines further follow up with Care Management Team. Goals completed.    Goals Addressed             This Visit's Progress    COMPLETED: continue to improve post hospitalization       Interventions Today    Flowsheet Row Most Recent Value  General Interventions   General Interventions Discussed/Reviewed --  [Declines additional engagement. Goals closed]            SDOH assessments and interventions completed:  No  Care Coordination Interventions:  No, not indicated   Follow up plan: No further intervention required.   Encounter Outcome:  Pt. Visit Completed   Kathyrn Sheriff, RN, MSN, BSN, CCM [facility] Care Coordinator [phone]

## 2023-05-19 ENCOUNTER — Ambulatory Visit (HOSPITAL_COMMUNITY): Payer: Medicare Other

## 2023-05-21 ENCOUNTER — Ambulatory Visit (HOSPITAL_COMMUNITY): Payer: Medicare Other

## 2023-05-25 DIAGNOSIS — C3431 Malignant neoplasm of lower lobe, right bronchus or lung: Secondary | ICD-10-CM | POA: Diagnosis not present

## 2023-05-25 DIAGNOSIS — J479 Bronchiectasis, uncomplicated: Secondary | ICD-10-CM | POA: Diagnosis not present

## 2023-05-25 DIAGNOSIS — J8410 Pulmonary fibrosis, unspecified: Secondary | ICD-10-CM | POA: Diagnosis not present

## 2023-05-25 DIAGNOSIS — J90 Pleural effusion, not elsewhere classified: Secondary | ICD-10-CM | POA: Diagnosis not present

## 2023-05-25 DIAGNOSIS — Z87891 Personal history of nicotine dependence: Secondary | ICD-10-CM | POA: Diagnosis not present

## 2023-05-25 DIAGNOSIS — K1230 Oral mucositis (ulcerative), unspecified: Secondary | ICD-10-CM | POA: Diagnosis not present

## 2023-05-25 DIAGNOSIS — I3139 Other pericardial effusion (noninflammatory): Secondary | ICD-10-CM | POA: Diagnosis not present

## 2023-05-25 DIAGNOSIS — C3491 Malignant neoplasm of unspecified part of right bronchus or lung: Secondary | ICD-10-CM | POA: Diagnosis not present

## 2023-05-25 DIAGNOSIS — J939 Pneumothorax, unspecified: Secondary | ICD-10-CM | POA: Diagnosis not present

## 2023-05-26 ENCOUNTER — Ambulatory Visit (HOSPITAL_COMMUNITY): Payer: Medicare Other

## 2023-05-28 ENCOUNTER — Ambulatory Visit (HOSPITAL_COMMUNITY): Payer: Medicare Other

## 2023-06-02 ENCOUNTER — Ambulatory Visit (HOSPITAL_COMMUNITY): Payer: Medicare Other

## 2023-06-04 ENCOUNTER — Ambulatory Visit (HOSPITAL_COMMUNITY): Payer: Medicare Other

## 2023-06-09 ENCOUNTER — Ambulatory Visit (HOSPITAL_COMMUNITY): Payer: Medicare Other

## 2023-06-11 ENCOUNTER — Encounter: Payer: Self-pay | Admitting: Cardiology

## 2023-06-11 ENCOUNTER — Ambulatory Visit: Payer: Medicare Other | Attending: Cardiology | Admitting: Cardiology

## 2023-06-11 ENCOUNTER — Ambulatory Visit (HOSPITAL_COMMUNITY): Payer: Medicare Other

## 2023-06-11 VITALS — BP 140/74 | HR 64 | Ht 63.6 in | Wt 101.0 lb

## 2023-06-11 DIAGNOSIS — I2510 Atherosclerotic heart disease of native coronary artery without angina pectoris: Secondary | ICD-10-CM | POA: Diagnosis not present

## 2023-06-11 DIAGNOSIS — E782 Mixed hyperlipidemia: Secondary | ICD-10-CM

## 2023-06-11 DIAGNOSIS — I10 Essential (primary) hypertension: Secondary | ICD-10-CM | POA: Diagnosis not present

## 2023-06-11 DIAGNOSIS — I2584 Coronary atherosclerosis due to calcified coronary lesion: Secondary | ICD-10-CM

## 2023-06-11 DIAGNOSIS — I471 Supraventricular tachycardia, unspecified: Secondary | ICD-10-CM

## 2023-06-11 DIAGNOSIS — I3131 Malignant pericardial effusion in diseases classified elsewhere: Secondary | ICD-10-CM | POA: Diagnosis not present

## 2023-06-11 DIAGNOSIS — C3431 Malignant neoplasm of lower lobe, right bronchus or lung: Secondary | ICD-10-CM

## 2023-06-11 NOTE — Progress Notes (Signed)
Cardiology Office Note:    Date:  06/11/2023   ID:  [name], DOB Sep 04, 1931, MRN [id number]  PCP:  Pearline Cables, MD  Cardiologist:  Garwin Brothers, MD   Referring MD: Pearline Cables, MD    ASSESSMENT:    1. Coronary artery calcification   2. Malignant pericardial effusion   3. Essential hypertension   4. Paroxysmal SVT (supraventricular tachycardia)   5. Adenocarcinoma of lower lobe of right lung (HCC)   6. Mixed hyperlipidemia    PLAN:    In order of problems listed above:  Coronary artery disease: Secondary prevention stressed with the patient.  Importance of compliance with diet medication stressed and she vocalized understanding.  She was advised to walk to the best of her ability and she is does very well at this with her exercise program. Essential hypertension: Blood pressure stable and diet was emphasized.  Lifestyle modification urged. Mixed dyslipidemia: On lipid-lowering medications followed by primary care.  Diet emphasized. History of paroxysmal supraventricular tachycardia: Stable.  No palpitations noted. History of pericardial effusion: We will repeat echocardiogram to assess this and get a follow-up on it.  CT scan report and pericardial effusion description reviewed and discussed with the patient. Patient will be seen in follow-up appointment in 6 months or earlier if the patient has any concerns.    Medication Adjustments/Labs and Tests Ordered: Current medicines are reviewed at length with the patient today.  Concerns regarding medicines are outlined above.  No orders of the defined types were placed in this encounter.  No orders of the defined types were placed in this encounter.    No chief complaint on file.    History of Present Illness:    [name] is a 89 y.o. female.  Patient has past medical history of essential hypertension, pericardial effusion, mixed dyslipidemia.  She has calcification of the coronary artery findings  on CT scan.  She denies any chest pain orthopnea or PND.  At the time of my evaluation, the patient is alert awake oriented and in no distress.  Past Medical History:  Diagnosis Date   Acute postoperative pain 01/21/2023   Adenocarcinoma of lower lobe of right lung (HCC) 01/30/2023   Atrial fibrillation (HCC) 01/30/2023   Autoimmune disease (HCC)    Back pain 05/04/2020   Barrett's esophagus    Basal cell carcinoma (BCC) in situ of skin 04/04/2020   Path 03/2020, [facility]    Bone lesion    sacrum   Breast cancer (HCC) 2010   cT1 N0 M0; ER+/ PR+/ HER2 neg; s/p lumpectomy 07/2010; started adjuvant hormonal therapy 10/2009   Cervical cancer (HCC)    Chronic midline thoracic back pain 12/31/2020   Dizziness 02/22/2019   Dysphagia    Esophageal spasm 09/20/2013   Essential hypertension 03/21/2019   Fracture, shoulder 03/06/2012   Pt seen at [facility] by Dr. Bradly Bienenstock on 03/09/2012 and 03/11/2012.   X-rays taken of left wrist, left elbow, and left shoulder.   Shoulder Fx: nondisplaced proximal humerus fracture involving the greater tuberosity. Wrist: consistent with an old distal radius fracture with shortening present.       Glaucoma    Hoarseness of voice 02/28/2016   Humerus fracture 03/2012   impacted left humueral neck fracture, treated by Dr. Amanda Pea   Hx of meningioma of the brain 08/28/2017   Stable since 2010- 2018 on MRI   Hydropneumothorax 01/30/2023   Hypercalcemia    Hyperlipidemia  Hypothyroidism    Intracranial hemorrhage (HCC)    Laryngopharyngeal reflux 03/19/2016   Orbital fracture, closed, initial encounter (HCC) 04/29/2020   Osteoporosis 03/18/2012   Pancolitis (HCC) 01/30/2023   Paroxysmal SVT (supraventricular tachycardia) 12/23/2020   Primary cancer of right lower lobe of lung (HCC) 01/20/2023   PVC (premature ventricular contraction) 09/08/2017   Raynaud's phenomenon 02/28/2016   Rib fractures 11/21/2022   Right lower lobe lung mass  11/21/2022   SAH (subarachnoid hemorrhage) (HCC) 04/29/2020   Squamous cell carcinoma in situ of skin 11/30/2018   Path 11/1008, [facility] managing    Stage 3a chronic kidney disease    Status post partial lobectomy of lung 01/20/2023   lower right lobe   Systolic murmur 02/23/2019   TBI (traumatic brain injury) (HCC) 05/04/2020    Past Surgical History:  Procedure Laterality Date   ABDOMINAL HYSTERECTOMY  1972   cancer          1 tube 1 ovary   APPENDECTOMY     BACK SURGERY  11/28/2020   BREAST LUMPECTOMY Left 2010   colon polyp removal     EYE SURGERY     HAND SURGERY     KYPHOPLASTY N/A 11/28/2020   Procedure: KYPHOPLASTY THORACIC TWELVE AND LUMBAR ONE;  Surgeon: Venita Lick, MD;  Location: [facility] OR;  Service: Orthopedics;  Laterality: N/A;  90 mins   LOBECTOMY Right 01/20/2023   right lower lobe   ortho procedures     multiple   OVARIAN CYST REMOVAL  1978   SHOULDER SURGERY  1954   left    Current Medications: Current Meds  Medication Sig   acetaminophen (TYLENOL) 500 MG tablet Take 2 tablets (1,000 mg total) by mouth 3 (three) times daily as needed for mild pain.   amLODipine (NORVASC) 2.5 MG tablet Take 1 tablet (2.5 mg total) by mouth daily.   Calcium Carbonate-Vitamin D (CALCIUM + D PO) Take 1 capsule by mouth daily.    cloNIDine (CATAPRES) 0.1 MG tablet Take 1 tablet (0.1 mg total) by mouth daily. Use if needed for vocal tic   latanoprost (XALATAN) 0.005 % ophthalmic solution Place 1 drop into both eyes at bedtime.    levothyroxine (SYNTHROID) 75 MCG tablet TAKE 1 TABLET BY MOUTH DAILY BEFORE BREAKFAST   osimertinib mesylate (TAGRISSO) 40 MG tablet Take 40 mg by mouth every other day.   rosuvastatin (CRESTOR) 10 MG tablet Take 1 tablet (10 mg total) by mouth daily. TAKE 1 BY MOUTH DAILY     Allergies:   Daypro [oxaprozin] and Prolia [denosumab]   Social History   Socioeconomic History   Marital status: Married    Spouse name: Not on file   Number of  children: 2   Years of education: 12   Highest education level: Not on file  Occupational History   Occupation: retired  Tobacco Use   Smoking status: Former    Current packs/day: 0.00    Types: Cigarettes    Quit date: 12/16/1986    Years since quitting: 36.5   Smokeless tobacco: Never   Tobacco comments:    FORMER SMOKER 30 YEARS AGO  Vaping Use   Vaping status: Never Used  Substance and Sexual Activity   Alcohol use: Yes    Alcohol/week: 0.0 standard drinks of alcohol    Comment: OCCASIONALLY   Drug use: No   Sexual activity: Not on file  Other Topics Concern   Not on file  Social History Narrative   Married  Exercise: Yes   Patient lives with husband in a one story home.  Has 2 daughters.  Retired from working in Marine scientist.  Education: high school.     Right handed    Social Determinants of Health   Financial Resource Strain: Low Risk  (02/11/2023)   Received from [facility], [facility]   Overall Financial Resource Strain (CARDIA)    Difficulty of Paying Living Expenses: Not very hard  Food Insecurity: No Food Insecurity (02/11/2023)   Hunger Vital Sign    Worried About Running Out of Food in the Last Year: Never true    Ran Out of Food in the Last Year: Never true  Transportation Needs: No Transportation Needs (03/27/2023)   PRAPARE - Administrator, Civil Service (Medical): No    Lack of Transportation (Non-Medical): No  Physical Activity: Inactive (01/10/2021)   Exercise Vital Sign    Days of Exercise per Week: 0 days    Minutes of Exercise per Session: 0 min  Stress: No Stress Concern Present (01/16/2022)   [employer] of Occupational Health - Occupational Stress Questionnaire    Feeling of Stress : Not at all  Social Connections: Socially Integrated (01/16/2022)   Social Connection and Isolation Panel [NHANES]    Frequency of Communication with Friends and Family: More than three times a week    Frequency of  Social Gatherings with Friends and Family: Never    Attends Religious Services: More than 4 times per year    Active Member of [employer] or Organizations: Yes    Attends Banker Meetings: Never    Marital Status: Married     Family History: The patient's family history includes Breast cancer in her paternal aunt; Cancer in her brother, brother, paternal aunt, and sister; Heart disease in her father. There is no history of Osteoporosis.  ROS:   Please see the history of present illness.    All other systems reviewed and are negative.  EKGs/Labs/Other Studies Reviewed:    The following studies were reviewed today: I discussed my findings with the patient at length   Recent Labs: 02/16/2023: Magnesium 2.0 03/18/2023: ALT 9; BUN 21; Creatinine, Ser 1.03; Hemoglobin 11.6; Platelets 337.0; Potassium 4.8; Sodium 141; TSH 2.35  Recent Lipid Panel    Component Value Date/Time   CHOL 178 07/31/2022 0911   TRIG 85.0 07/31/2022 0911   HDL 82.90 07/31/2022 0911   CHOLHDL 2 07/31/2022 0911   VLDL 17.0 07/31/2022 0911   LDLCALC 78 07/31/2022 0911    Physical Exam:    VS:  BP (!) 140/74   Pulse 64   Ht 5' 3.6" (1.615 m)   Wt 101 lb (45.8 kg)   SpO2 99%   BMI 17.56 kg/m     Wt Readings from Last 3 Encounters:  06/11/23 101 lb (45.8 kg)  03/18/23 104 lb 4.4 oz (47.3 kg)  03/18/23 103 lb 3.2 oz (46.8 kg)     GEN: Patient is in no acute distress HEENT: Normal NECK: No JVD; No carotid bruits LYMPHATICS: No lymphadenopathy CARDIAC: Hear sounds regular, 2/6 systolic murmur at the apex. RESPIRATORY:  Clear to auscultation without rales, wheezing or rhonchi  ABDOMEN: Soft, non-tender, non-distended MUSCULOSKELETAL:  No edema; No deformity  SKIN: Warm and dry NEUROLOGIC:  Alert and oriented x 3 PSYCHIATRIC:  Normal affect   Signed, Garwin Brothers, MD  06/11/2023 10:54 AM    [facility]

## 2023-06-11 NOTE — Patient Instructions (Signed)
Medication Instructions:  Your physician recommends that you continue on your current medications as directed. Please refer to the Current Medication list given to you today.  *If you need a refill on your cardiac medications before your next appointment, please call your pharmacy*   Lab Work: None ordered If you have labs (blood work) drawn today and your tests are completely normal, you will receive your results only by: MyChart Message (if you have MyChart) OR A paper copy in the mail If you have any lab test that is abnormal or we need to change your treatment, we will call you to review the results.   Testing/Procedures: Your physician has requested that you have an echocardiogram. Echocardiography is a painless test that uses sound waves to create images of your heart. It provides your doctor with information about the size and shape of your heart and how well your heart's chambers and valves are working. This procedure takes approximately one hour. There are no restrictions for this procedure. Please do NOT wear cologne, perfume, aftershave, or lotions (deodorant is allowed). Please arrive 15 minutes prior to your appointment time.     Follow-Up: At [facility], you and your health needs are our priority.  As part of our continuing mission to provide you with exceptional heart care, we have created designated Provider Care Teams.  These Care Teams include your primary Cardiologist (physician) and Advanced Practice Providers (APPs -  Physician Assistants and Nurse Practitioners) who all work together to provide you with the care you need, when you need it.  We recommend signing up for the patient portal called "MyChart".  Sign up information is provided on this After Visit Summary.  MyChart is used to connect with patients for Virtual Visits (Telemedicine).  Patients are able to view lab/test results, encounter notes, upcoming appointments, etc.  Non-urgent messages can be sent to  your provider as well.   To learn more about what you can do with MyChart, go to [email or web address].    Your next appointment:   9 month(s)  The format for your next appointment:   In Person  Provider:   Rajan Revankar, MD   Other Instructions Echocardiogram An echocardiogram is a test that uses sound waves (ultrasound) to produce images of the heart. Images from an echocardiogram can provide important information about: Heart size and shape. The size and thickness and movement of your heart's walls. Heart muscle function and strength. Heart valve function or if you have stenosis. Stenosis is when the heart valves are too narrow. If blood is flowing backward through the heart valves (regurgitation). A tumor or infectious growth around the heart valves. Areas of heart muscle that are not working well because of poor blood flow or injury from a heart attack. Aneurysm detection. An aneurysm is a weak or damaged part of an artery wall. The wall bulges out from the normal force of blood pumping through the body. Tell a health care provider about: Any allergies you have. All medicines you are taking, including vitamins, herbs, eye drops, creams, and over-the-counter medicines. Any blood disorders you have. Any surgeries you have had. Any medical conditions you have. Whether you are pregnant or may be pregnant. What are the risks? Generally, this is a safe test. However, problems may occur, including an allergic reaction to dye (contrast) that may be used during the test. What happens before the test? No specific preparation is needed. You may eat and drink normally. What happens during the test? You will   take off your clothes from the waist up and put on a hospital gown. Electrodes or electrocardiogram (ECG)patches may be placed on your chest. The electrodes or patches are then connected to a device that monitors your heart rate and rhythm. You will lie down on a table for an  ultrasound exam. A gel will be applied to your chest to help sound waves pass through your skin. A handheld device, called a transducer, will be pressed against your chest and moved over your heart. The transducer produces sound waves that travel to your heart and bounce back (or "echo" back) to the transducer. These sound waves will be captured in real-time and changed into images of your heart that can be viewed on a video monitor. The images will be recorded on a computer and reviewed by your health care provider. You may be asked to change positions or hold your breath for a short time. This makes it easier to get different views or better views of your heart. In some cases, you may receive contrast through an IV in one of your veins. This can improve the quality of the pictures from your heart. The procedure may vary among health care providers and hospitals.   What can I expect after the test? You may return to your normal, everyday life, including diet, activities, and medicines, unless your health care provider tells you not to do that. Follow these instructions at home: It is up to you to get the results of your test. Ask your health care provider, or the department that is doing the test, when your results will be ready. Keep all follow-up visits. This is important. Summary An echocardiogram is a test that uses sound waves (ultrasound) to produce images of the heart. Images from an echocardiogram can provide important information about the size and shape of your heart, heart muscle function, heart valve function, and other possible heart problems. You do not need to do anything to prepare before this test. You may eat and drink normally. After the echocardiogram is completed, you may return to your normal, everyday life, unless your health care provider tells you not to do that. This information is not intended to replace advice given to you by your health care provider. Make sure you  discuss any questions you have with your health care provider. Document Revised: 05/22/2020 Document Reviewed: 05/22/2020 Elsevier Patient Education  2021 Elsevier Inc.   Important Information About Sugar        

## 2023-06-16 DIAGNOSIS — M4316 Spondylolisthesis, lumbar region: Secondary | ICD-10-CM | POA: Diagnosis not present

## 2023-06-16 DIAGNOSIS — M47816 Spondylosis without myelopathy or radiculopathy, lumbar region: Secondary | ICD-10-CM | POA: Diagnosis not present

## 2023-06-16 DIAGNOSIS — G8929 Other chronic pain: Secondary | ICD-10-CM | POA: Diagnosis not present

## 2023-06-16 DIAGNOSIS — M545 Low back pain, unspecified: Secondary | ICD-10-CM | POA: Diagnosis not present

## 2023-06-16 DIAGNOSIS — M48061 Spinal stenosis, lumbar region without neurogenic claudication: Secondary | ICD-10-CM | POA: Diagnosis not present

## 2023-06-16 DIAGNOSIS — I7090 Unspecified atherosclerosis: Secondary | ICD-10-CM | POA: Diagnosis not present

## 2023-06-19 ENCOUNTER — Other Ambulatory Visit: Payer: Self-pay | Admitting: Cardiology

## 2023-06-19 DIAGNOSIS — I2510 Atherosclerotic heart disease of native coronary artery without angina pectoris: Secondary | ICD-10-CM

## 2023-06-19 DIAGNOSIS — I471 Supraventricular tachycardia, unspecified: Secondary | ICD-10-CM

## 2023-06-19 DIAGNOSIS — I3131 Malignant pericardial effusion in diseases classified elsewhere: Secondary | ICD-10-CM

## 2023-06-19 DIAGNOSIS — E782 Mixed hyperlipidemia: Secondary | ICD-10-CM

## 2023-06-19 DIAGNOSIS — I10 Essential (primary) hypertension: Secondary | ICD-10-CM

## 2023-06-19 DIAGNOSIS — C3431 Malignant neoplasm of lower lobe, right bronchus or lung: Secondary | ICD-10-CM

## 2023-07-06 DIAGNOSIS — C3431 Malignant neoplasm of lower lobe, right bronchus or lung: Secondary | ICD-10-CM | POA: Diagnosis not present

## 2023-07-06 DIAGNOSIS — C3491 Malignant neoplasm of unspecified part of right bronchus or lung: Secondary | ICD-10-CM | POA: Diagnosis not present

## 2023-07-12 NOTE — Progress Notes (Signed)
[facility] [address] [place], [place] [address] [phone]  Date:  07/20/2023   Name:  [name]   DOB:  Apr 14, 1931   MRN:  [id number]  PCP:  Lori Cables, MD    Chief Complaint: 4 month follow up (Concerns/ questions: nothing new or different/Flu shot today: yes/)   History of Present Illness:  [name] is a 89 y.o. very pleasant female patient who presents with the following:  Patient seen today for periodic follow-up Most recent visit with myself was in June-notes from that visit Earlier this year she was diagnosed with lung cancer found incidentally on imaging study for rib injury.  Also, history of hypertension, hyperlipidemia, hypothyroidism, breast cancer, more recently some difficulty with paranoia and anxiety  Most recent visit with myself was in May of this year for hospital follow-up, at that time she had been admitted at [facility] for 10 days.  She had a recent right lower lobectomy on 01/20/2023, then was admitted from 4/20 through 02/10/2023 with C. difficile  She saw her cardiologist, Dr. Tomie China at the end of August: Coronary artery disease: Secondary prevention stressed with the patient.  Importance of compliance with diet medication stressed and she vocalized understanding.  She was advised to walk to the best of her ability and she is does very well at this with her exercise program. Essential hypertension: Blood pressure stable and diet was emphasized.  Lifestyle modification urged. Mixed dyslipidemia: On lipid-lowering medications followed by primary care.  Diet emphasized. History of paroxysmal supraventricular tachycardia: Stable.  No palpitations noted. History of pericardial effusion: We will repeat echocardiogram to assess this and get a follow-up on it.  CT scan report and pericardial effusion description reviewed and discussed with the patient.  She is getting her oncology care at  [facility] recent visit from mid August-they plan to see her back in 3 months She is taking osimertinab 40 mg daily  She was seen for a televisit just 2 weeks ago   She did an echo on 9/30: per Dr Josiah Lobo  Persisting pericardial effusion. We will continue to monitor   Give flu shot today-they plan to get their COVID shot at pharmacy ASAP  She is getting some food stuck when she swallows- she may have to regurgitate about once a week She did see Dr Bosie Clos in the past but her esophageal  stenosis was not amenable to intervention at that time.  [name] notes her symptoms of likely esophageal stenosis are getting worse-she is having to chew her food very thoroughly and take small bites.  She has dropped about 5 pounds.  We discussed strategies for increasing her caloric intake.  She is already using Ensure.  I suggested she try adding other high-calorie liquids such as whole milk with chocolate syrup.  She has difficulty with ice cream because the cold bothers her, but she has used melted ice cream before and this worked well- could do this daily    Lab Results  Component Value Date   TSH 2.35 03/18/2023    Patient Active Problem List   Diagnosis Date Noted   Coronary artery calcification 06/11/2023   Malignant pericardial effusion 06/11/2023   Pancolitis (HCC) 01/30/2023   Atrial fibrillation (HCC) 01/30/2023   Hydropneumothorax 01/30/2023   Adenocarcinoma of lower lobe of right lung (HCC) 01/30/2023   Acute postoperative pain 01/21/2023   Status post partial lobectomy of lung 01/20/2023  Primary cancer of right lower lobe of lung (HCC) 01/20/2023   Rib fractures 11/21/2022   Right lower lobe lung mass 11/21/2022   Autoimmune disease (HCC) 07/31/2021   Barrett's esophagus 07/31/2021   Bone lesion 07/31/2021   Cervical cancer (HCC) 07/31/2021   Dysphagia 07/31/2021   Hypothyroidism 07/31/2021   Chronic midline thoracic back pain 12/31/2020   Paroxysmal SVT (supraventricular  tachycardia) (HCC) 12/23/2020   Intracranial hemorrhage (HCC)    TBI (traumatic brain injury) (HCC) 05/04/2020   Back pain 05/04/2020   SAH (subarachnoid hemorrhage) (HCC) 04/29/2020   Orbital fracture, closed, initial encounter (HCC) 04/29/2020   Basal cell carcinoma (BCC) in situ of skin 04/04/2020   Essential hypertension 03/21/2019   Systolic murmur 02/23/2019   Hypercalcemia    Dizziness 02/22/2019   Squamous cell carcinoma in situ of skin 11/30/2018   PVC (premature ventricular contraction) 09/08/2017   Glaucoma 09/08/2017   Hx of meningioma of the brain 08/28/2017   Laryngopharyngeal reflux 03/19/2016   Hoarseness of voice 02/28/2016   Raynaud's phenomenon 02/28/2016   Esophageal spasm 09/20/2013   Osteoporosis 03/18/2012   Humerus fracture 03/2012   Fracture, shoulder 03/06/2012   Hyperlipidemia    Breast cancer (HCC)     Past Medical History:  Diagnosis Date   Acute postoperative pain 01/21/2023   Adenocarcinoma of lower lobe of right lung (HCC) 01/30/2023   Atrial fibrillation (HCC) 01/30/2023   Autoimmune disease (HCC)    Back pain 05/04/2020   Barrett's esophagus    Basal cell carcinoma (BCC) in situ of skin 04/04/2020   Path 03/2020, [facility]    Bone lesion    sacrum   Breast cancer (HCC) 2010   cT1 N0 M0; ER+/ PR+/ HER2 neg; s/p lumpectomy 07/2010; started adjuvant hormonal therapy 10/2009   Cervical cancer (HCC)    Chronic midline thoracic back pain 12/31/2020   Dizziness 02/22/2019   Dysphagia    Esophageal spasm 09/20/2013   Essential hypertension 03/21/2019   Fracture, shoulder 03/06/2012   Pt seen at [facility] by Dr. Bradly Bienenstock on 03/09/2012 and 03/11/2012.   X-rays taken of left wrist, left elbow, and left shoulder.   Shoulder Fx: nondisplaced proximal humerus fracture involving the greater tuberosity. Wrist: consistent with an old distal radius fracture with shortening present.       Glaucoma    Hoarseness of voice 02/28/2016    Humerus fracture 03/2012   impacted left humueral neck fracture, treated by Dr. Amanda Pea   Hx of meningioma of the brain 08/28/2017   Stable since 2010- 2018 on MRI   Hydropneumothorax 01/30/2023   Hypercalcemia    Hyperlipidemia    Hypothyroidism    Intracranial hemorrhage (HCC)    Laryngopharyngeal reflux 03/19/2016   Orbital fracture, closed, initial encounter (HCC) 04/29/2020   Osteoporosis 03/18/2012   Pancolitis (HCC) 01/30/2023   Paroxysmal SVT (supraventricular tachycardia) 12/23/2020   Primary cancer of right lower lobe of lung (HCC) 01/20/2023   PVC (premature ventricular contraction) 09/08/2017   Raynaud's phenomenon 02/28/2016   Rib fractures 11/21/2022   Right lower lobe lung mass 11/21/2022   SAH (subarachnoid hemorrhage) (HCC) 04/29/2020   Squamous cell carcinoma in situ of skin 11/30/2018   Path 11/1008, Dermatology managing    Stage 3a chronic kidney disease    Status post partial lobectomy of lung 01/20/2023   lower right lobe   Systolic murmur 02/23/2019   TBI (traumatic brain injury) (HCC) 05/04/2020    Past Surgical History:  Procedure Laterality Date  ABDOMINAL HYSTERECTOMY  1972   cancer          1 tube 1 ovary   APPENDECTOMY     BACK SURGERY  11/28/2020   BREAST LUMPECTOMY Left 2010   colon polyp removal     EYE SURGERY     HAND SURGERY     KYPHOPLASTY N/A 11/28/2020   Procedure: KYPHOPLASTY THORACIC TWELVE AND LUMBAR ONE;  Surgeon: Venita Lick, MD;  Location: [facility] OR;  Service: Orthopedics;  Laterality: N/A;  90 mins   LOBECTOMY Right 01/20/2023   right lower lobe   ortho procedures     multiple   OVARIAN CYST REMOVAL  1978   SHOULDER SURGERY  1954   left    Social History   Tobacco Use   Smoking status: Former    Current packs/day: 0.00    Types: Cigarettes    Quit date: 12/16/1986    Years since quitting: 36.6   Smokeless tobacco: Never   Tobacco comments:    FORMER SMOKER 30 YEARS AGO  Vaping Use   Vaping status: Never Used   Substance Use Topics   Alcohol use: Yes    Alcohol/week: 0.0 standard drinks of alcohol    Comment: OCCASIONALLY   Drug use: No    Family History  Problem Relation Age of Onset   Heart disease Father    Cancer Sister        lung, kidney   Cancer Brother        brain   Cancer Paternal Aunt        breast   Breast cancer Paternal Aunt    Cancer Brother        liver, lung   Osteoporosis Neg Hx     Allergies  Allergen Reactions   Daypro [Oxaprozin] Swelling    Face and tongue   Prolia [Denosumab]     unknown    Medication list has been reviewed and updated.  Current Outpatient Medications on File Prior to Visit  Medication Sig Dispense Refill   acetaminophen (TYLENOL) 500 MG tablet Take 2 tablets (1,000 mg total) by mouth 3 (three) times daily as needed for mild pain.     amLODipine (NORVASC) 2.5 MG tablet Take 1 tablet (2.5 mg total) by mouth daily. 90 tablet 3   Calcium Carbonate-Vitamin D (CALCIUM + D PO) Take 1 capsule by mouth daily.      cloNIDine (CATAPRES) 0.1 MG tablet Take 1 tablet (0.1 mg total) by mouth daily. Use if needed for vocal tic 30 tablet 3   latanoprost (XALATAN) 0.005 % ophthalmic solution Place 1 drop into both eyes at bedtime.      levothyroxine (SYNTHROID) 75 MCG tablet TAKE 1 TABLET BY MOUTH DAILY BEFORE BREAKFAST 90 tablet 3   osimertinib mesylate (TAGRISSO) 40 MG tablet Take 40 mg by mouth every other day.     rosuvastatin (CRESTOR) 10 MG tablet Take 1 tablet (10 mg total) by mouth daily. TAKE 1 BY MOUTH DAILY 90 tablet 3   No current facility-administered medications on file prior to visit.    Review of Systems:  As per HPI- otherwise negative.   Physical Examination: Vitals:   07/20/23 0914  BP: 130/72  Pulse: 85  Resp: 18  Temp: 98.4 F (36.9 C)  SpO2: 98%   Vitals:   07/20/23 0914  Weight: 100 lb 12.8 oz (45.7 kg)  Height: 5\' 4"  (1.626 m)   Body mass index is 17.3 kg/m. Ideal Body Weight: Weight in (lb)  to have BMI =  25: 145.3  GEN: no acute distress.  Elderly lady, looks well  Kyphosis/ scoliosis is present  HEENT: Atraumatic, Normocephalic.  Ears and Nose: No external deformity. CV: RRR, No M/G/R. No JVD. No thrill. No extra heart sounds. PULM: CTA B, no wheezes, crackles, rhonchi. No retractions. No resp. distress. No accessory muscle use. ABD: S, NT, ND EXTR: No c/c/e PSYCH: Normally interactive. Conversant.   Wt Readings from Last 3 Encounters:  07/20/23 100 lb 12.8 oz (45.7 kg)  06/11/23 101 lb (45.8 kg)  03/18/23 104 lb 4.4 oz (47.3 kg)    Assessment and Plan: Esophageal stricture - Plan: Ambulatory referral to Gastroenterology  Immunization due - Plan: Flu Vaccine Trivalent High Dose (Fluad)  Malignant neoplasm of left lung, unspecified part of lung (HCC)  Essential hypertension  Weight loss  Patient seen today for follow-up.  Referral to GI, suspect her esophageal stricture has gotten worse and is causing difficulty with feeding.  She has dropped about 5 pounds.  Discussed strategies to increase caloric intake as well Gave flu shot today, COVID booster this fall Blood pressure is well-controlled [facility]-  osimertinab 40 mg daily for lung cancer I asked her to come back and see me around Christmas time for follow-up.  We will plan to do blood work at that time [facility] did a CBC in August which showed normal hemoglobin Signed Abbe Amsterdam, MD

## 2023-07-12 NOTE — Patient Instructions (Addendum)
It was good to see again today, assuming all is well please see me in about 3 months- we will do labs at that time I sent a referral to [facility] (Dr Bosie Clos) asking them to see you regarding your swallowing issue.  It may be time to do some sort of intervention  Flu shot given today!   Please stop by imaging on the ground floor to get your covid vaccine- Bud can get his covid as well!

## 2023-07-13 ENCOUNTER — Ambulatory Visit (HOSPITAL_BASED_OUTPATIENT_CLINIC_OR_DEPARTMENT_OTHER)
Admission: RE | Admit: 2023-07-13 | Discharge: 2023-07-13 | Disposition: A | Discharge status: Home / Self Care | Payer: Medicare Other | Source: Ambulatory Visit | Attending: Cardiology | Admitting: Cardiology

## 2023-07-13 DIAGNOSIS — I2584 Coronary atherosclerosis due to calcified coronary lesion: Secondary | ICD-10-CM | POA: Insufficient documentation

## 2023-07-13 DIAGNOSIS — I3131 Malignant pericardial effusion in diseases classified elsewhere: Secondary | ICD-10-CM | POA: Diagnosis not present

## 2023-07-13 DIAGNOSIS — I471 Supraventricular tachycardia, unspecified: Secondary | ICD-10-CM | POA: Insufficient documentation

## 2023-07-13 DIAGNOSIS — I2510 Atherosclerotic heart disease of native coronary artery without angina pectoris: Secondary | ICD-10-CM | POA: Diagnosis not present

## 2023-07-13 LAB — ECHOCARDIOGRAM COMPLETE
AR max vel: 1.7 cm2
AV Area VTI: 1.71 cm2
AV Area mean vel: 1.75 cm2
AV Mean grad: 4 mm[Hg]
AV Peak grad: 6 mm[Hg]
Ao pk vel: 1.22 m/s
Area-P 1/2: 2.71 cm2
Calc EF: 70.9 %
S' Lateral: 2.3 cm
Single Plane A2C EF: 73.7 %
Single Plane A4C EF: 67 %

## 2023-07-20 ENCOUNTER — Other Ambulatory Visit (HOSPITAL_BASED_OUTPATIENT_CLINIC_OR_DEPARTMENT_OTHER): Payer: Self-pay

## 2023-07-20 ENCOUNTER — Ambulatory Visit (INDEPENDENT_AMBULATORY_CARE_PROVIDER_SITE_OTHER): Payer: Medicare Other | Admitting: Family Medicine

## 2023-07-20 VITALS — BP 130/72 | HR 85 | Temp 98.4°F | Resp 18 | Ht 64.0 in | Wt 100.8 lb

## 2023-07-20 DIAGNOSIS — K222 Esophageal obstruction: Secondary | ICD-10-CM

## 2023-07-20 DIAGNOSIS — Z23 Encounter for immunization: Secondary | ICD-10-CM

## 2023-07-20 DIAGNOSIS — I10 Essential (primary) hypertension: Secondary | ICD-10-CM

## 2023-07-20 DIAGNOSIS — C3492 Malignant neoplasm of unspecified part of left bronchus or lung: Secondary | ICD-10-CM

## 2023-07-20 DIAGNOSIS — R634 Abnormal weight loss: Secondary | ICD-10-CM

## 2023-07-20 MED ORDER — COMIRNATY 30 MCG/0.3ML IM SUSY
PREFILLED_SYRINGE | INTRAMUSCULAR | 0 refills | Status: DC
  Filled 2023-07-20: qty 0.3, 1d supply, fill #0

## 2023-07-31 DIAGNOSIS — H401132 Primary open-angle glaucoma, bilateral, moderate stage: Secondary | ICD-10-CM | POA: Diagnosis not present

## 2023-07-31 DIAGNOSIS — H5319 Other subjective visual disturbances: Secondary | ICD-10-CM | POA: Diagnosis not present

## 2023-08-12 DIAGNOSIS — R1310 Dysphagia, unspecified: Secondary | ICD-10-CM | POA: Diagnosis not present

## 2023-08-13 ENCOUNTER — Other Ambulatory Visit (HOSPITAL_COMMUNITY): Payer: Self-pay | Admitting: Gastroenterology

## 2023-08-13 DIAGNOSIS — R1310 Dysphagia, unspecified: Secondary | ICD-10-CM

## 2023-08-18 ENCOUNTER — Other Ambulatory Visit (HOSPITAL_COMMUNITY): Payer: Self-pay | Admitting: Gastroenterology

## 2023-08-18 ENCOUNTER — Ambulatory Visit (HOSPITAL_COMMUNITY)
Admission: RE | Admit: 2023-08-18 | Discharge: 2023-08-18 | Disposition: A | Discharge status: Home / Self Care | Payer: Medicare Other | Source: Ambulatory Visit | Attending: Gastroenterology | Admitting: Gastroenterology

## 2023-08-18 DIAGNOSIS — R1310 Dysphagia, unspecified: Secondary | ICD-10-CM | POA: Insufficient documentation

## 2023-08-18 DIAGNOSIS — K219 Gastro-esophageal reflux disease without esophagitis: Secondary | ICD-10-CM | POA: Diagnosis not present

## 2023-08-18 DIAGNOSIS — R638 Other symptoms and signs concerning food and fluid intake: Secondary | ICD-10-CM | POA: Diagnosis not present

## 2023-08-31 DIAGNOSIS — R7989 Other specified abnormal findings of blood chemistry: Secondary | ICD-10-CM | POA: Diagnosis not present

## 2023-08-31 DIAGNOSIS — J8410 Pulmonary fibrosis, unspecified: Secondary | ICD-10-CM | POA: Diagnosis not present

## 2023-08-31 DIAGNOSIS — C3431 Malignant neoplasm of lower lobe, right bronchus or lung: Secondary | ICD-10-CM | POA: Diagnosis not present

## 2023-08-31 DIAGNOSIS — R1310 Dysphagia, unspecified: Secondary | ICD-10-CM | POA: Diagnosis not present

## 2023-08-31 DIAGNOSIS — R918 Other nonspecific abnormal finding of lung field: Secondary | ICD-10-CM | POA: Diagnosis not present

## 2023-08-31 DIAGNOSIS — J90 Pleural effusion, not elsewhere classified: Secondary | ICD-10-CM | POA: Diagnosis not present

## 2023-08-31 DIAGNOSIS — I3139 Other pericardial effusion (noninflammatory): Secondary | ICD-10-CM | POA: Diagnosis not present

## 2023-08-31 DIAGNOSIS — Z902 Acquired absence of lung [part of]: Secondary | ICD-10-CM | POA: Diagnosis not present

## 2023-09-04 ENCOUNTER — Other Ambulatory Visit (HOSPITAL_BASED_OUTPATIENT_CLINIC_OR_DEPARTMENT_OTHER): Payer: Self-pay | Admitting: Family Medicine

## 2023-09-04 DIAGNOSIS — Z1231 Encounter for screening mammogram for malignant neoplasm of breast: Secondary | ICD-10-CM

## 2023-09-21 ENCOUNTER — Encounter (HOSPITAL_BASED_OUTPATIENT_CLINIC_OR_DEPARTMENT_OTHER): Payer: Self-pay

## 2023-09-21 ENCOUNTER — Ambulatory Visit (HOSPITAL_BASED_OUTPATIENT_CLINIC_OR_DEPARTMENT_OTHER)
Admission: RE | Admit: 2023-09-21 | Discharge: 2023-09-21 | Disposition: A | Discharge status: Home / Self Care | Payer: Medicare Other | Source: Ambulatory Visit | Attending: Family Medicine | Admitting: Family Medicine

## 2023-09-21 DIAGNOSIS — Z1231 Encounter for screening mammogram for malignant neoplasm of breast: Secondary | ICD-10-CM | POA: Insufficient documentation

## 2023-10-16 DIAGNOSIS — L570 Actinic keratosis: Secondary | ICD-10-CM | POA: Diagnosis not present

## 2023-10-16 DIAGNOSIS — D225 Melanocytic nevi of trunk: Secondary | ICD-10-CM | POA: Diagnosis not present

## 2023-10-16 DIAGNOSIS — L821 Other seborrheic keratosis: Secondary | ICD-10-CM | POA: Diagnosis not present

## 2023-10-16 DIAGNOSIS — Z85828 Personal history of other malignant neoplasm of skin: Secondary | ICD-10-CM | POA: Diagnosis not present

## 2023-10-16 DIAGNOSIS — L720 Epidermal cyst: Secondary | ICD-10-CM | POA: Diagnosis not present

## 2023-10-17 NOTE — Patient Instructions (Addendum)
 It was great to see you today, I will be in touch with your lab work.  We will check on your thyroid  level I would recommend a dose of RSV vaccine at your pharmacy if not done already.  Also recommend a COVID booster if none in the last 6 months or so  Your EKG looks ok- you are NOT in atrial fibrillation Please see cardiology at [facility] as planned.  However if you are not doing ok in the meantime please alert me Otherwise please see me in 4-6 months

## 2023-10-17 NOTE — Progress Notes (Addendum)
  Healthcare at [facility] [address], [address] [place], [place] [address] [phone]  Date:  10/21/2023   Name:  [name]   DOB:  Feb 04, 1931   MRN:  [id number]  PCP:  Watt Lori BROCKS, MD    Chief Complaint: 3 month follow up (Concerns/ questions: none)   History of Present Illness:  [name] is a 89 y.o. very pleasant female patient who presents with the following:  Patient seen today for periodic follow-up.  Most recent visit with myself was in October In 2024 she was diagnosed with lung cancer found incidentally on imaging study for rib injury-she underwent lobectomy April 2024, recovery complicated by C. difficile Also, history of hypertension, hyperlipidemia, hypothyroidism, breast cancer, more recently some difficulty with paranoia and anxiety   Oncology care is at [facility] Her cardiologist is Dr. Edwyna who has been following her for pericardial effusion  At our visit in October she noted concern of esophageal stricture symptoms causing difficulty swallowing.  GI did not think her esophagus was amenable to intervention at this time, she had a dilation back in 2007 She did see Dr. Dianna for follow-up at the end of October-it looks like they planned a barium study. Pt notes this was done.  At this time they are not planning intervention but they are watching carefully  She notes her swallowing is overall improved- she is being care  Seen by oncology at [facility] 08/31/2023: ASSESSMENT AND PLAN: [name] 89 y.o. with a diagnosis of lung mass 1) lung adenocarcinoma - I have discussed extensively with the patient and family regarding the presumed diagnosis of lung cancer, natural history, and anticipated treatment options. She is interested in additional diagnostic evaluation in order to determine treatment options. PETCT shows 4.8 cm hypermet lesion in the RLL stage II without clear nodal or distant disease . She has  undergone right lower lobectomy yielding a 4.7 cm tumor with hilar nodal involvement pathologic T2b pN1 stage IIb adenocarcinoma. Genotyping shows EGFR exon 19 positivity. We discussed the rationale relative risks and benefits of postoperative adjuvant osimertinib.  Started Osimertinib 40 mg daily on 04/02/23. Intolerant with sever mucositis, reduced to 40 mg Q3 days w/o sig s/e. Gradually increased to 40 mg daily starting 06/23/23. CT 08/31/23 stable overall, monitor small right subpleural nodules. TKI 40 mg daily. Support as below. RTC in 3 months with lab and CT.  2) mucositis - resolved. viscous lidocaine  prn.  3) dysphagia - f/w local GI, discussed dietary modifications with well cooked and soft foods, foods with gravy and sauce.  4) pericardial effusion - recent ECHO at [facility] showed mod effusion. This is increased from February scan. Refer to card asap.  5) elevated cr - increase oral liquids. Imodium  prn for mild diarrhea.   She will see [facility] cardiology regarding her pericardial effusion later on this month She also has pleural effusion.  Most recent chest CT 08/31/2023 IMPRESSION:  1. Status post right lower lobectomy.  2. Unchanged subcentimeter groundglass nodule in the left lower lobe. A few  subpleural solid nodules in the right lung remain similar to the previous  exam, though have mildly increased from more remote exam dated 11/21/2022.  Recommend continued CT surveillance. It is possible that these subpleural  nodules could be the result of confluent fibrosis.   3. No significant interval change in basilar predominant fibrotic lung  disease.  4. Unchanged small to moderate loculated right  pleural effusion. Unchanged  moderate-sized pericardial effusion.  5. Unchanged dilated proximal esophagus; query dysmotility.   Pt notes she is tolerating the osimertinib ok Flu shot, pneumonia, Shingrix complete Recommend RSV, COVID booster  She has some blood work done at [facility] in  [place], CBC Minimal anemia, hemoglobin 11.4   Lab Results  Component Value Date   TSH 2.71 10/21/2023   Amlodipine  2.5 Clonidine  as needed vocal tic Levothyroxine  75 Crestor  10 Tagrisso every other day  No CP She does feel like she is getting more SOB recently-for example, when she talks she will get winded.  This is not acutely changed Patient Active Problem List   Diagnosis Date Noted   Coronary artery calcification 06/11/2023   Malignant pericardial effusion 06/11/2023   Pancolitis (HCC) 01/30/2023   Atrial fibrillation (HCC) 01/30/2023   Hydropneumothorax 01/30/2023   Adenocarcinoma of lower lobe of right lung (HCC) 01/30/2023   Acute postoperative pain 01/21/2023   Status post partial lobectomy of lung 01/20/2023   Primary cancer of right lower lobe of lung (HCC) 01/20/2023   Rib fractures 11/21/2022   Right lower lobe lung mass 11/21/2022   Autoimmune disease (HCC) 07/31/2021   Barrett's esophagus 07/31/2021   Bone lesion 07/31/2021   Cervical cancer (HCC) 07/31/2021   Dysphagia 07/31/2021   Hypothyroidism 07/31/2021   Chronic midline thoracic back pain 12/31/2020   Paroxysmal SVT (supraventricular tachycardia) (HCC) 12/23/2020   Intracranial hemorrhage (HCC)    TBI (traumatic brain injury) (HCC) 05/04/2020   Back pain 05/04/2020   SAH (subarachnoid hemorrhage) (HCC) 04/29/2020   Orbital fracture, closed, initial encounter (HCC) 04/29/2020   Basal cell carcinoma (BCC) in situ of skin 04/04/2020   Essential hypertension 03/21/2019   Systolic murmur 02/23/2019   Hypercalcemia    Dizziness 02/22/2019   Squamous cell carcinoma in situ of skin 11/30/2018   PVC (premature ventricular contraction) 09/08/2017   Glaucoma 09/08/2017   Hx of meningioma of the brain 08/28/2017   Laryngopharyngeal reflux 03/19/2016   Hoarseness of voice 02/28/2016   Raynaud's phenomenon 02/28/2016   Esophageal spasm 09/20/2013   Osteoporosis 03/18/2012   Humerus fracture 03/2012    Fracture, shoulder 03/06/2012   Hyperlipidemia    Breast cancer (HCC)     Past Medical History:  Diagnosis Date   Acute postoperative pain 01/21/2023   Adenocarcinoma of lower lobe of right lung (HCC) 01/30/2023   Atrial fibrillation (HCC) 01/30/2023   Autoimmune disease (HCC)    Back pain 05/04/2020   Barrett's esophagus    Basal cell carcinoma (BCC) in situ of skin 04/04/2020   Path 03/2020, [facility]    Bone lesion    sacrum   Breast cancer (HCC) 2010   cT1 N0 M0; ER+/ PR+/ HER2 neg; s/p lumpectomy 07/2010; started adjuvant hormonal therapy 10/2009   Cervical cancer (HCC)    Chronic midline thoracic back pain 12/31/2020   Dizziness 02/22/2019   Dysphagia    Esophageal spasm 09/20/2013   Essential hypertension 03/21/2019   Fracture, shoulder 03/06/2012   Pt seen at [facility] by Dr. Prentice Pagan on 03/09/2012 and 03/11/2012.   X-rays taken of left wrist, left elbow, and left shoulder.   Shoulder Fx: nondisplaced proximal humerus fracture involving the greater tuberosity. Wrist: consistent with an old distal radius fracture with shortening present.       Glaucoma    Hoarseness of voice 02/28/2016   Humerus fracture 03/2012   impacted left humueral neck fracture, treated by Dr. Camella   Hx of meningioma  of the brain 08/28/2017   Stable since 2010- 2018 on MRI   Hydropneumothorax 01/30/2023   Hypercalcemia    Hyperlipidemia    Hypothyroidism    Intracranial hemorrhage (HCC)    Laryngopharyngeal reflux 03/19/2016   Orbital fracture, closed, initial encounter (HCC) 04/29/2020   Osteoporosis 03/18/2012   Pancolitis (HCC) 01/30/2023   Paroxysmal SVT (supraventricular tachycardia) (HCC) 12/23/2020   Primary cancer of right lower lobe of lung (HCC) 01/20/2023   PVC (premature ventricular contraction) 09/08/2017   Raynaud's phenomenon 02/28/2016   Rib fractures 11/21/2022   Right lower lobe lung mass 11/21/2022   SAH (subarachnoid hemorrhage) (HCC)  04/29/2020   Squamous cell carcinoma in situ of skin 11/30/2018   Path 11/1008, Dermatology managing    Stage 3a chronic kidney disease    Status post partial lobectomy of lung 01/20/2023   lower right lobe   Systolic murmur 02/23/2019   TBI (traumatic brain injury) (HCC) 05/04/2020    Past Surgical History:  Procedure Laterality Date   ABDOMINAL HYSTERECTOMY  1972   cancer          1 tube 1 ovary   APPENDECTOMY     BACK SURGERY  11/28/2020   BREAST LUMPECTOMY Left 2010   colon polyp removal     EYE SURGERY     HAND SURGERY     KYPHOPLASTY N/A 11/28/2020   Procedure: KYPHOPLASTY THORACIC TWELVE AND LUMBAR ONE;  Surgeon: Burnetta Aures, MD;  Location: [facility] OR;  Service: Orthopedics;  Laterality: N/A;  90 mins   LOBECTOMY Right 01/20/2023   right lower lobe   ortho procedures     multiple   OVARIAN CYST REMOVAL  1978   SHOULDER SURGERY  1954   left    Social History   Tobacco Use   Smoking status: Former    Current packs/day: 0.00    Types: Cigarettes    Quit date: 12/16/1986    Years since quitting: 36.8   Smokeless tobacco: Never   Tobacco comments:    FORMER SMOKER 30 YEARS AGO  Vaping Use   Vaping status: Never Used  Substance Use Topics   Alcohol use: Yes    Alcohol/week: 0.0 standard drinks of alcohol    Comment: OCCASIONALLY   Drug use: No    Family History  Problem Relation Age of Onset   Heart disease Father    Cancer Sister        lung, kidney   Cancer Brother        brain   Cancer Paternal Aunt        breast   Breast cancer Paternal Aunt    Cancer Brother        liver, lung   Osteoporosis Neg Hx     Allergies  Allergen Reactions   Daypro [Oxaprozin] Swelling    Face and tongue   Prolia  [Denosumab ]     unknown    Medication list has been reviewed and updated.  Current Outpatient Medications on File Prior to Visit  Medication Sig Dispense Refill   acetaminophen  (TYLENOL ) 500 MG tablet Take 2 tablets (1,000 mg total) by mouth 3 (three)  times daily as needed for mild pain.     amLODipine  (NORVASC ) 2.5 MG tablet Take 1 tablet (2.5 mg total) by mouth daily. 90 tablet 3   Calcium  Carbonate-Vitamin D  (CALCIUM  + D PO) Take 1 capsule by mouth daily.      cloNIDine  (CATAPRES ) 0.1 MG tablet Take 1 tablet (0.1 mg total) by mouth  daily. Use if needed for vocal tic 30 tablet 3   latanoprost  (XALATAN ) 0.005 % ophthalmic solution Place 1 drop into both eyes at bedtime.      levothyroxine  (SYNTHROID ) 75 MCG tablet TAKE 1 TABLET BY MOUTH DAILY BEFORE BREAKFAST 90 tablet 3   osimertinib mesylate (TAGRISSO) 40 MG tablet Take 40 mg by mouth every other day.     rosuvastatin  (CRESTOR ) 10 MG tablet Take 1 tablet (10 mg total) by mouth daily. TAKE 1 BY MOUTH DAILY 90 tablet 3   No current facility-administered medications on file prior to visit.    Review of Systems:  As per HPI- otherwise negative.   Physical Examination: Vitals:   10/21/23 0917 10/21/23 0940  BP: (!) 142/80 130/75  Pulse: 62   Resp: 18   Temp: 98.1 F (36.7 C)   SpO2: 99%    Vitals:   10/21/23 0917  Weight: 105 lb (47.6 kg)  Height: 5' 4 (1.626 m)   Body mass index is 18.02 kg/m. Ideal Body Weight: Weight in (lb) to have BMI = 25: 145.3  GEN: no acute distress. Looks her normal self HEENT: Atraumatic, Normocephalic.  Ears and Nose: No external deformity. CV: Some irregularity, will check EKG to rule out A-fib, No M/G/R. No JVD. No thrill. No extra heart sounds. PULM: CTA B, no wheezes, crackles, rhonchi. No retractions. No resp. distress. No accessory muscle use. ABD: S, NT, ND, +BS. No rebound. No HSM. EXTR: No c/c/e PSYCH: Normally interactive. Conversant.   EKG; sinus rhythm with occasional ectopic, low voltage.  Compared with previous EKG from April no significant changes noted.  No atrial fibrillation Assessment and Plan: Essential hypertension  Esophageal stricture  Weight loss  Malignant neoplasm of right lung, unspecified part of lung  (HCC)  Acquired hypothyroidism - Plan: TSH  Mixed hyperlipidemia  Irregularly irregular pulse rhythm - Plan: EKG 12-Lead  Pericardial effusion - Plan: DG Chest 2 View  Patient seen today for follow-up.  Blood pressure under reasonable control She has seen GI for esophageal stricture, for now she is treated conservatively with diet modification She has put on just a few pounds, doing better Wt Readings from Last 3 Encounters:  10/21/23 105 lb (47.6 kg)  07/20/23 100 lb 12.8 oz (45.7 kg)  06/11/23 101 lb (45.8 kg)    She continues to follow-up with [facility] for pericardial effusion as well as her lung cancer. EKG today confirms sinus rhythm-will obtain a chest x-ray to make sure no significant worsening of lung or pericardial effusions Follow-up on thyroid   Signed Lori Schroeder, MD  Received her labs and chest films- called her to go over her results.  Thyroid  remains in desired range-continue current dose of levothyroxine  Chest film was read as possible atelectasis or infiltrate.  Suspect this is the pleural effusion which was already picked up on her CT chest done at [facility] in November.  However, it may take a some time to get a comparison from our chest x-ray to the [facility] CT scan.  Patient elects to use a course of antibiotics and patient to be developing an infection which is reasonable Prescribed Augmentin  From CT [facility], November 2024: 4. Unchanged small to moderate loculated right pleural effusion. Unchanged  moderate-sized pericardial effusion.   Results for orders placed or performed in visit on 10/21/23  TSH   Collection Time: 10/21/23 10:06 AM  Result Value Ref Range   TSH 2.71 0.35 - 5.50 uIU/mL    DG Chest 2 View Result Date:  10/21/2023 CLINICAL DATA:  Pleural and pericardial disease. EXAM: CHEST - 2 VIEW COMPARISON:  January 31, 2023. FINDINGS: Stable cardiomegaly. Minimal left basilar subsegmental atelectasis or scarring is noted. Mildly increased right basilar opacity is  noted concerning for worsening atelectasis or possibly infiltrate. Probable scarring is noted laterally in right upper lobe. Thoracic kyphosis is noted. Status post kyphoplasty of 2 lower thoracic vertebral bodies. IMPRESSION: Mildly increased right basilar opacity is noted concerning for worsening atelectasis or possibly infiltrate with possible small pleural effusion or pleural thickening. Electronically Signed   By: Lynwood Landy Raddle M.D.   On: 10/21/2023 10:45

## 2023-10-21 ENCOUNTER — Ambulatory Visit (HOSPITAL_BASED_OUTPATIENT_CLINIC_OR_DEPARTMENT_OTHER)
Admission: RE | Admit: 2023-10-21 | Discharge: 2023-10-21 | Disposition: A | Discharge status: Home / Self Care | Payer: Medicare Other | Source: Ambulatory Visit | Attending: Family Medicine | Admitting: Family Medicine

## 2023-10-21 ENCOUNTER — Telehealth: Payer: Self-pay | Admitting: Family Medicine

## 2023-10-21 ENCOUNTER — Ambulatory Visit: Payer: Medicare Other | Admitting: Family Medicine

## 2023-10-21 ENCOUNTER — Encounter: Payer: Self-pay | Admitting: Family Medicine

## 2023-10-21 VITALS — BP 130/75 | HR 62 | Temp 98.1°F | Resp 18 | Ht 64.0 in | Wt 105.0 lb

## 2023-10-21 DIAGNOSIS — C3492 Malignant neoplasm of unspecified part of left bronchus or lung: Secondary | ICD-10-CM

## 2023-10-21 DIAGNOSIS — I3139 Other pericardial effusion (noninflammatory): Secondary | ICD-10-CM

## 2023-10-21 DIAGNOSIS — M40204 Unspecified kyphosis, thoracic region: Secondary | ICD-10-CM | POA: Diagnosis not present

## 2023-10-21 DIAGNOSIS — E782 Mixed hyperlipidemia: Secondary | ICD-10-CM

## 2023-10-21 DIAGNOSIS — K222 Esophageal obstruction: Secondary | ICD-10-CM

## 2023-10-21 DIAGNOSIS — E039 Hypothyroidism, unspecified: Secondary | ICD-10-CM

## 2023-10-21 DIAGNOSIS — R918 Other nonspecific abnormal finding of lung field: Secondary | ICD-10-CM | POA: Diagnosis not present

## 2023-10-21 DIAGNOSIS — J949 Pleural condition, unspecified: Secondary | ICD-10-CM | POA: Diagnosis not present

## 2023-10-21 DIAGNOSIS — I10 Essential (primary) hypertension: Secondary | ICD-10-CM | POA: Diagnosis not present

## 2023-10-21 DIAGNOSIS — I499 Cardiac arrhythmia, unspecified: Secondary | ICD-10-CM

## 2023-10-21 DIAGNOSIS — C3491 Malignant neoplasm of unspecified part of right bronchus or lung: Secondary | ICD-10-CM

## 2023-10-21 DIAGNOSIS — R634 Abnormal weight loss: Secondary | ICD-10-CM | POA: Diagnosis not present

## 2023-10-21 DIAGNOSIS — I517 Cardiomegaly: Secondary | ICD-10-CM | POA: Diagnosis not present

## 2023-10-21 LAB — TSH: TSH: 2.71 u[IU]/mL (ref 0.35–5.50)

## 2023-10-21 MED ORDER — AMOXICILLIN-POT CLAVULANATE 875-125 MG PO TABS
1.0000 | ORAL_TABLET | Freq: Two times a day (BID) | ORAL | 0 refills | Status: DC
Start: 2023-10-21 — End: 2023-11-06

## 2023-10-21 NOTE — Telephone Encounter (Signed)
 Corrected

## 2023-10-21 NOTE — Telephone Encounter (Signed)
 Copied from CRM [id number]. Topic: General - Other >> Oct 21, 2023  1:06 PM Robinson H wrote: Reason for CRM: Patient called stating her today's after visit summary states she has lung cancer in left lung but she has it in her lower right lung.  [place] [phone]

## 2023-10-21 NOTE — Addendum Note (Signed)
 Addended by: Abbe Amsterdam C on: 10/21/2023 05:08 PM   Modules accepted: Orders

## 2023-11-03 ENCOUNTER — Other Ambulatory Visit: Payer: Self-pay

## 2023-11-03 ENCOUNTER — Emergency Department (HOSPITAL_BASED_OUTPATIENT_CLINIC_OR_DEPARTMENT_OTHER): Payer: Medicare Other

## 2023-11-03 ENCOUNTER — Inpatient Hospital Stay (HOSPITAL_BASED_OUTPATIENT_CLINIC_OR_DEPARTMENT_OTHER)
Admission: EM | Admit: 2023-11-03 | Discharge: 2023-11-06 | DRG: 372 | Disposition: A | Discharge status: Home / Self Care | Payer: Medicare Other | Attending: Internal Medicine | Admitting: Internal Medicine

## 2023-11-03 ENCOUNTER — Encounter (HOSPITAL_BASED_OUTPATIENT_CLINIC_OR_DEPARTMENT_OTHER): Payer: Self-pay

## 2023-11-03 DIAGNOSIS — E039 Hypothyroidism, unspecified: Secondary | ICD-10-CM | POA: Diagnosis present

## 2023-11-03 DIAGNOSIS — Z90710 Acquired absence of both cervix and uterus: Secondary | ICD-10-CM

## 2023-11-03 DIAGNOSIS — E872 Acidosis, unspecified: Secondary | ICD-10-CM | POA: Diagnosis not present

## 2023-11-03 DIAGNOSIS — J90 Pleural effusion, not elsewhere classified: Secondary | ICD-10-CM | POA: Diagnosis present

## 2023-11-03 DIAGNOSIS — K561 Intussusception: Secondary | ICD-10-CM | POA: Diagnosis present

## 2023-11-03 DIAGNOSIS — Z87820 Personal history of traumatic brain injury: Secondary | ICD-10-CM

## 2023-11-03 DIAGNOSIS — C3490 Malignant neoplasm of unspecified part of unspecified bronchus or lung: Secondary | ICD-10-CM | POA: Diagnosis present

## 2023-11-03 DIAGNOSIS — Z79890 Hormone replacement therapy (postmenopausal): Secondary | ICD-10-CM

## 2023-11-03 DIAGNOSIS — J8410 Pulmonary fibrosis, unspecified: Secondary | ICD-10-CM | POA: Diagnosis present

## 2023-11-03 DIAGNOSIS — Z8249 Family history of ischemic heart disease and other diseases of the circulatory system: Secondary | ICD-10-CM

## 2023-11-03 DIAGNOSIS — E785 Hyperlipidemia, unspecified: Secondary | ICD-10-CM | POA: Diagnosis present

## 2023-11-03 DIAGNOSIS — A0472 Enterocolitis due to Clostridium difficile, not specified as recurrent: Secondary | ICD-10-CM | POA: Diagnosis not present

## 2023-11-03 DIAGNOSIS — J189 Pneumonia, unspecified organism: Secondary | ICD-10-CM | POA: Diagnosis present

## 2023-11-03 DIAGNOSIS — Z79899 Other long term (current) drug therapy: Secondary | ICD-10-CM

## 2023-11-03 DIAGNOSIS — Z803 Family history of malignant neoplasm of breast: Secondary | ICD-10-CM

## 2023-11-03 DIAGNOSIS — D631 Anemia in chronic kidney disease: Secondary | ICD-10-CM | POA: Diagnosis not present

## 2023-11-03 DIAGNOSIS — A0471 Enterocolitis due to Clostridium difficile, recurrent: Principal | ICD-10-CM | POA: Diagnosis present

## 2023-11-03 DIAGNOSIS — K529 Noninfective gastroenteritis and colitis, unspecified: Secondary | ICD-10-CM | POA: Diagnosis not present

## 2023-11-03 DIAGNOSIS — I4891 Unspecified atrial fibrillation: Secondary | ICD-10-CM | POA: Diagnosis not present

## 2023-11-03 DIAGNOSIS — Z87891 Personal history of nicotine dependence: Secondary | ICD-10-CM

## 2023-11-03 DIAGNOSIS — I10 Essential (primary) hypertension: Secondary | ICD-10-CM | POA: Diagnosis present

## 2023-11-03 DIAGNOSIS — Z8541 Personal history of malignant neoplasm of cervix uteri: Secondary | ICD-10-CM

## 2023-11-03 DIAGNOSIS — M810 Age-related osteoporosis without current pathological fracture: Secondary | ICD-10-CM | POA: Diagnosis not present

## 2023-11-03 DIAGNOSIS — I129 Hypertensive chronic kidney disease with stage 1 through stage 4 chronic kidney disease, or unspecified chronic kidney disease: Secondary | ICD-10-CM | POA: Diagnosis not present

## 2023-11-03 DIAGNOSIS — Z888 Allergy status to other drugs, medicaments and biological substances status: Secondary | ICD-10-CM

## 2023-11-03 DIAGNOSIS — I498 Other specified cardiac arrhythmias: Secondary | ICD-10-CM | POA: Diagnosis not present

## 2023-11-03 DIAGNOSIS — Z860100 Personal history of colon polyps, unspecified: Secondary | ICD-10-CM

## 2023-11-03 DIAGNOSIS — K2270 Barrett's esophagus without dysplasia: Secondary | ICD-10-CM | POA: Diagnosis present

## 2023-11-03 DIAGNOSIS — I3139 Other pericardial effusion (noninflammatory): Secondary | ICD-10-CM | POA: Diagnosis not present

## 2023-11-03 DIAGNOSIS — I3131 Malignant pericardial effusion in diseases classified elsewhere: Secondary | ICD-10-CM | POA: Diagnosis not present

## 2023-11-03 DIAGNOSIS — Z902 Acquired absence of lung [part of]: Secondary | ICD-10-CM | POA: Diagnosis not present

## 2023-11-03 DIAGNOSIS — E871 Hypo-osmolality and hyponatremia: Secondary | ICD-10-CM | POA: Diagnosis not present

## 2023-11-03 DIAGNOSIS — N1831 Chronic kidney disease, stage 3a: Secondary | ICD-10-CM | POA: Diagnosis not present

## 2023-11-03 DIAGNOSIS — Z853 Personal history of malignant neoplasm of breast: Secondary | ICD-10-CM | POA: Diagnosis not present

## 2023-11-03 DIAGNOSIS — Z808 Family history of malignant neoplasm of other organs or systems: Secondary | ICD-10-CM

## 2023-11-03 DIAGNOSIS — Z1721 Progesterone receptor positive status: Secondary | ICD-10-CM

## 2023-11-03 DIAGNOSIS — I7300 Raynaud's syndrome without gangrene: Secondary | ICD-10-CM | POA: Diagnosis not present

## 2023-11-03 DIAGNOSIS — H409 Unspecified glaucoma: Secondary | ICD-10-CM | POA: Diagnosis not present

## 2023-11-03 DIAGNOSIS — Z85828 Personal history of other malignant neoplasm of skin: Secondary | ICD-10-CM

## 2023-11-03 DIAGNOSIS — Z1152 Encounter for screening for COVID-19: Secondary | ICD-10-CM

## 2023-11-03 DIAGNOSIS — Z801 Family history of malignant neoplasm of trachea, bronchus and lung: Secondary | ICD-10-CM

## 2023-11-03 DIAGNOSIS — Z86007 Personal history of in-situ neoplasm of skin: Secondary | ICD-10-CM

## 2023-11-03 DIAGNOSIS — Z8051 Family history of malignant neoplasm of kidney: Secondary | ICD-10-CM

## 2023-11-03 DIAGNOSIS — Z85118 Personal history of other malignant neoplasm of bronchus and lung: Secondary | ICD-10-CM

## 2023-11-03 DIAGNOSIS — Z170 Estrogen receptor positive status [ER+]: Secondary | ICD-10-CM

## 2023-11-03 DIAGNOSIS — R197 Diarrhea, unspecified: Secondary | ICD-10-CM

## 2023-11-03 HISTORY — DX: Enterocolitis due to Clostridium difficile, not specified as recurrent: A04.72

## 2023-11-03 LAB — CBC
HCT: 34.3 % — ABNORMAL LOW (ref 36.0–46.0)
Hemoglobin: 11 g/dL — ABNORMAL LOW (ref 12.0–15.0)
MCH: 29.6 pg (ref 26.0–34.0)
MCHC: 32.1 g/dL (ref 30.0–36.0)
MCV: 92.5 fL (ref 80.0–100.0)
Platelets: 167 10*3/uL (ref 150–400)
RBC: 3.71 MIL/uL — ABNORMAL LOW (ref 3.87–5.11)
RDW: 15.1 % (ref 11.5–15.5)
WBC: 16.5 10*3/uL — ABNORMAL HIGH (ref 4.0–10.5)
nRBC: 0 % (ref 0.0–0.2)

## 2023-11-03 LAB — COMPREHENSIVE METABOLIC PANEL
ALT: 27 U/L (ref 0–44)
AST: 36 U/L (ref 15–41)
Albumin: 3.7 g/dL (ref 3.5–5.0)
Alkaline Phosphatase: 90 U/L (ref 38–126)
Anion gap: 12 (ref 5–15)
BUN: 21 mg/dL (ref 8–23)
CO2: 22 mmol/L (ref 22–32)
Calcium: 9.7 mg/dL (ref 8.9–10.3)
Chloride: 100 mmol/L (ref 98–111)
Creatinine, Ser: 0.95 mg/dL (ref 0.44–1.00)
GFR, Estimated: 57 mL/min — ABNORMAL LOW (ref >60)
Glucose, Bld: 89 mg/dL (ref 70–99)
Potassium: 4.1 mmol/L (ref 3.5–5.1)
Sodium: 134 mmol/L — ABNORMAL LOW (ref 135–145)
Total Bilirubin: 0.7 mg/dL (ref 0.0–1.2)
Total Protein: 7.4 g/dL (ref 6.5–8.1)

## 2023-11-03 LAB — URINALYSIS, ROUTINE W REFLEX MICROSCOPIC
Bilirubin Urine: NEGATIVE
Glucose, UA: NEGATIVE mg/dL
Ketones, ur: NEGATIVE mg/dL
Leukocytes,Ua: NEGATIVE
Nitrite: NEGATIVE
Protein, ur: NEGATIVE mg/dL
Specific Gravity, Urine: 1.01 (ref 1.005–1.030)
pH: 6 (ref 5.0–8.0)

## 2023-11-03 LAB — URINALYSIS, MICROSCOPIC (REFLEX): WBC, UA: NONE SEEN WBC/hpf (ref 0–5)

## 2023-11-03 LAB — RESP PANEL BY RT-PCR (RSV, FLU A&B, COVID)  RVPGX2
Influenza A by PCR: NEGATIVE
Influenza B by PCR: NEGATIVE
Resp Syncytial Virus by PCR: NEGATIVE
SARS Coronavirus 2 by RT PCR: NEGATIVE

## 2023-11-03 LAB — MAGNESIUM: Magnesium: 1.8 mg/dL (ref 1.7–2.4)

## 2023-11-03 LAB — LIPASE, BLOOD: Lipase: 26 U/L (ref 11–51)

## 2023-11-03 MED ORDER — SODIUM CHLORIDE 0.9 % IV BOLUS
1000.0000 mL | Freq: Once | INTRAVENOUS | Status: AC
Start: 2023-11-03 — End: 2023-11-03
  Administered 2023-11-03: 1000 mL via INTRAVENOUS

## 2023-11-03 MED ORDER — ACETAMINOPHEN 325 MG PO TABS
650.0000 mg | ORAL_TABLET | Freq: Once | ORAL | Status: AC | PRN
  Administered 2023-11-03: 650 mg via ORAL
  Filled 2023-11-03: qty 2

## 2023-11-03 MED ORDER — FIDAXOMICIN 200 MG PO TABS
200.0000 mg | ORAL_TABLET | Freq: Two times a day (BID) | ORAL | Status: DC
  Filled 2023-11-03: qty 1

## 2023-11-03 MED ORDER — VANCOMYCIN HCL 125 MG PO CAPS: 125.0000 mg | ORAL_CAPSULE | Freq: Four times a day (QID) | ORAL | Status: DC

## 2023-11-03 MED ORDER — IOHEXOL 300 MG/ML  SOLN
90.0000 mL | Freq: Once | INTRAMUSCULAR | Status: AC | PRN
  Administered 2023-11-03: 90 mL via INTRAVENOUS

## 2023-11-03 NOTE — ED Provider Notes (Signed)
[place] [facility] AT [facility] Provider Note   CSN: [id number] Arrival date & time: 11/03/23  1538     History  Chief Complaint  Patient presents with   Diarrhea    [name] is a 89 y.o. female with a history of C. difficile presenting to the ED with concern for diarrhea, onset 4 days ago.  Patient reports it is foul-smelling and predominantly watery, about once an hour.  She is concerned about recurring C. difficile.  She reports she completed a course of antibiotics for presumed lung infection, with Augmentin, 2 days ago.  She states in the past she had to be hospitalized at [facility] for C. difficile.  I confirmed this with her husband at the bedside and her daughter by phone.  She sees oncology at [facility] for adenocarcinoma of the lung, and she is on immunotherapy for this.  I reviewed her records from [facility] including her hospitalization course in April 2024, reporting patient has a history of adenocarcinoma status post right lower lobectomy earlier that month, paroxysmal A-fib not anticoagulated; she was treated with fidaxomicin a 10-day course at that time.  HPI     Home Medications Prior to Admission medications   Medication Sig Start Date End Date Taking? Authorizing Provider  acetaminophen (TYLENOL) 500 MG tablet Take 2 tablets (1,000 mg total) by mouth 3 (three) times daily as needed for mild pain. 11/24/22   Hongalgi, Maximino Greenland, MD  amLODipine (NORVASC) 2.5 MG tablet Take 1 tablet (2.5 mg total) by mouth daily. 03/23/23   Copland, [name], MD  amoxicillin-clavulanate (AUGMENTIN) 875-125 MG tablet Take 1 tablet by mouth 2 (two) times daily. 10/21/23   Copland, [name], MD  Calcium Carbonate-Vitamin D (CALCIUM + D PO) Take 1 capsule by mouth daily.     [provider]  cloNIDine (CATAPRES) 0.1 MG tablet Take 1 tablet (0.1 mg total) by mouth daily. Use if needed for vocal tic 12/03/22   Copland, [name], MD  latanoprost (XALATAN) 0.005 %  ophthalmic solution Place 1 drop into both eyes at bedtime.     [provider]  levothyroxine (SYNTHROID) 75 MCG tablet TAKE 1 TABLET BY MOUTH DAILY BEFORE BREAKFAST 11/03/22   Copland, [name], MD  osimertinib mesylate (TAGRISSO) 40 MG tablet Take 40 mg by mouth every other day.    [provider]  rosuvastatin (CRESTOR) 10 MG tablet Take 1 tablet (10 mg total) by mouth daily. TAKE 1 BY MOUTH DAILY 03/23/23   Copland, [name], MD      Allergies    Daypro [oxaprozin] and Prolia [denosumab]    Review of Systems   Review of Systems  Physical Exam Updated Vital Signs BP (!) 110/96 (BP Location: Left Arm)   Pulse 64   Temp 97.8 F (36.6 C) (Oral)   Resp 16   Ht 5\' 4"  (1.626 m)   Wt 47.6 kg   SpO2 97%   BMI 18.02 kg/m  Physical Exam Constitutional:      General: She is not in acute distress. HENT:     Head: Normocephalic and atraumatic.  Eyes:     Conjunctiva/sclera: Conjunctivae normal.     Pupils: Pupils are equal, round, and reactive to light.  Cardiovascular:     Rate and Rhythm: Normal rate and regular rhythm.  Pulmonary:     Effort: Pulmonary effort is normal. No respiratory distress.  Abdominal:     General: There is no distension.  Tenderness: There is no abdominal tenderness.  Skin:    General: Skin is warm and dry.  Neurological:     General: No focal deficit present.     Mental Status: She is alert. Mental status is at baseline.  Psychiatric:        Mood and Affect: Mood normal.        Behavior: Behavior normal.     ED Results / Procedures / Treatments   Labs (all labs ordered are listed, but only abnormal results are displayed) Labs Reviewed  COMPREHENSIVE METABOLIC PANEL - Abnormal; Notable for the following components:      Result Value   Sodium 134 (*)    GFR, Estimated 57 (*)    All other components within normal limits  CBC - Abnormal; Notable for the following components:   WBC 16.5 (*)    RBC 3.71 (*)    Hemoglobin  11.0 (*)    HCT 34.3 (*)    All other components within normal limits  RESP PANEL BY RT-PCR (RSV, FLU A&B, COVID)  RVPGX2  C DIFFICILE QUICK SCREEN W PCR REFLEX    LIPASE, BLOOD  MAGNESIUM  URINALYSIS, ROUTINE W REFLEX MICROSCOPIC    EKG None  Radiology CT ABDOMEN PELVIS W CONTRAST Result Date: 11/03/2023 CLINICAL DATA:  Diarrhea since Friday. Back pain. History of breast cancer and lung cancer status post lumpectomy. Hysterectomy. EXAM: CT ABDOMEN AND PELVIS WITH CONTRAST TECHNIQUE: Multidetector CT imaging of the abdomen and pelvis was performed using the standard protocol following bolus administration of intravenous contrast. RADIATION DOSE REDUCTION: This exam was performed according to the departmental dose-optimization program which includes automated exposure control, adjustment of the mA and/or kV according to patient size and/or use of iterative reconstruction technique. CONTRAST:  90mL OMNIPAQUE IOHEXOL 300 MG/ML  SOLN COMPARISON:  CT 01/30/2023 FINDINGS: Lower chest: Increased large pericardial effusion. Similar small right pleural effusion. Chronic interstitial lung disease is unchanged. Hepatobiliary: No acute abnormality. Pancreas: Unremarkable. Spleen: Unremarkable. Adrenals/Urinary Tract: Stable adrenal glands and kidneys. No urinary calculi or hydronephrosis. Unremarkable bladder. Stomach/Bowel: Diffuse colonic wall thickening with mucosal hyperenhancement, slightly improved compared to 01/30/2023. Small bowel small bowel intussusception in the left abdomen (series 601/image 80). No evidence of lead point. No obstruction. Stomach is within normal limits. Vascular/Lymphatic: Aortic atherosclerosis. No enlarged abdominal or pelvic lymph nodes. Reproductive: Hysterectomy.  No adnexal mass. Other: No free intraperitoneal fluid or air. Musculoskeletal: New age-indeterminate mild compression of the superior endplate of L4 compared with 01/30/2023. No retropulsion. Chronic compression  fractures of T11-L1. Kyphoplasty T12 and L1. IMPRESSION: 1. Diffuse colonic wall thickening with mucosal hyperenhancement, slightly improved compared to 01/30/2023. Findings are consistent with colitis. 2. New age-indeterminate mild compression of the superior endplate of L4 compared with 01/30/2023. No retropulsion. 3. Small bowel-small bowel intussusception in the left abdomen. This may be transient however short interval follow up CT is recommended to ensure resolution and exclude a neoplastic lead point. 4. Increased large pericardial effusion. 5. Similar small right pleural effusion. Aortic Atherosclerosis (ICD10-I70.0). Electronically Signed   By: Minerva Fester M.D.   On: 11/03/2023 20:46    Procedures Procedures    Medications Ordered in ED Medications  fidaxomicin (DIFICID) tablet 200 mg (has no administration in time range)  acetaminophen (TYLENOL) tablet 650 mg (650 mg Oral Given 11/03/23 1615)  sodium chloride 0.9 % bolus 1,000 mL (0 mLs Intravenous Stopped 11/03/23 1752)  iohexol (OMNIPAQUE) 300 MG/ML solution 90 mL (90 mLs Intravenous Contrast Given 11/03/23 2020)  ED Course/ Medical Decision Making/ A&P Clinical Course as of 11/03/23 2238  Tue Nov 03, 2023  2124 Dr Donell Beers - no emergent surgery plans - will evaluate pt on arrival. [MT]  2136 Pharmacist reports we don't have oral vancomycin or fidixamin in stock at med center - this will need to be shipped over or given in the hospital upon arrival [MT]  2235 Hospitalist accepted admission.  Patient family requested she be transferred to [facility] rather than [facility], due to disagreements with her treatment at [place] on prior hospitalization.  Therefore I also spoke to Dr Hillery Hunter the general surgeon at [facility], who was willing to consult on the patient upon arrival at [facility]. [MT]    Clinical Course User Index [MT] Mizani Dilday, Kermit Balo, MD                                 Medical Decision Making Amount and/or Complexity of Data  Reviewed Labs: ordered. Radiology: ordered.  Risk OTC drugs. Prescription drug management. Decision regarding hospitalization.   This patient presents to the ED with concern for fever, diarrhea. This involves an extensive number of treatment options, and is a complaint that carries with it a high risk of complications and morbidity.  The differential diagnosis includes recurring C. difficile colitis versus viral gastroenteritis versus other  Co-morbidities that complicate the patient evaluation: History of C. difficile and recent antibiotic use at risk of recurrence  Additional history obtained from patient's husband and daughter  External records from outside source obtained and reviewed including [facility] medical records from April 2024  I ordered and personally interpreted labs.  The pertinent results include: White blood cell count elevation at 16,000.  COVID and flu are negative.  I ordered imaging studies including CT abdomen pelvis I independently visualized and interpreted imaging which showed wall thickening consistent with colitis, chronic pericardial effusion, intussusception noted I agree with the radiologist interpretation  The patient was maintained on a cardiac monitor.  I personally viewed and interpreted the cardiac monitored which showed an underlying rhythm of: Sinus rhythm  I ordered medication including IV fluid, or hydration, Tylenol for fever, oral dificid for C Diff  I have reviewed the patients home medicines and have made adjustments as needed  After the interventions noted above, I reevaluated the patient and found that they have: stayed the same   I would not start empiric broad-spectrum antibiotics at this time given the high clinical concern for C. difficile colitis.  The patient had recently completed Augmentin which is a fairly powerful or broad-spectrum antibiotic already.  I do not suspect that she has sepsis.  The patient is also pain-free on my  reassessment, though she has had sharp intermittent abdominal pains.  She is not having evidence of obstruction, such as nausea or vomiting, and is requesting and drinking water at the bedside.  I do not believe she has ischemic gut.  I did discuss the case with the general surgeons who will see her in consultation, but unlikely that she would require emergent surgery.  See ED course.  I also updated the patient's daughter by phone about her diagnosis and plan for admission, and they were all in agreement with this plan.  Of note, there was question about L4 compression endplate deformity on CT.  This does not appear to correlate with acute pain on the patient's exam, I suspect this is more likely a chronic finding.  In either case, this would be a stable fracture, not requiring emergent MRI.  Dispostion:  After consideration of the diagnostic results and the patients response to treatment, I feel that the patent would benefit from medical admission.         Final Clinical Impression(s) / ED Diagnoses Final diagnoses:  Colitis    Rx / DC Orders ED Discharge Orders     None         Terald Sleeper, MD 11/03/23 2238

## 2023-11-03 NOTE — ED Triage Notes (Addendum)
Pt states that she has had diarrhea since Friday. States that she has had 5 episodes today. States she has back pain. Denies stomach pain. States that she can't control it. Spoke with daughter. States that she is just completing an ABT for a lung infection and thinks that this could be from that.  Hx of Cdiff.    Pt has lung cancer and had a lobectomy last year.   Geronimo Boot at [facility] is her cancer doctor.

## 2023-11-04 ENCOUNTER — Other Ambulatory Visit (HOSPITAL_COMMUNITY): Payer: Self-pay

## 2023-11-04 ENCOUNTER — Telehealth (HOSPITAL_COMMUNITY): Payer: Self-pay | Admitting: Pharmacy Technician

## 2023-11-04 ENCOUNTER — Inpatient Hospital Stay (HOSPITAL_COMMUNITY): Payer: Medicare Other

## 2023-11-04 DIAGNOSIS — C3490 Malignant neoplasm of unspecified part of unspecified bronchus or lung: Secondary | ICD-10-CM | POA: Diagnosis not present

## 2023-11-04 DIAGNOSIS — H409 Unspecified glaucoma: Secondary | ICD-10-CM | POA: Diagnosis present

## 2023-11-04 DIAGNOSIS — Z87891 Personal history of nicotine dependence: Secondary | ICD-10-CM | POA: Diagnosis not present

## 2023-11-04 DIAGNOSIS — Z8249 Family history of ischemic heart disease and other diseases of the circulatory system: Secondary | ICD-10-CM | POA: Diagnosis not present

## 2023-11-04 DIAGNOSIS — J189 Pneumonia, unspecified organism: Secondary | ICD-10-CM | POA: Diagnosis present

## 2023-11-04 DIAGNOSIS — I3131 Malignant pericardial effusion in diseases classified elsewhere: Secondary | ICD-10-CM

## 2023-11-04 DIAGNOSIS — E871 Hypo-osmolality and hyponatremia: Secondary | ICD-10-CM | POA: Diagnosis present

## 2023-11-04 DIAGNOSIS — K561 Intussusception: Secondary | ICD-10-CM | POA: Diagnosis not present

## 2023-11-04 DIAGNOSIS — I498 Other specified cardiac arrhythmias: Secondary | ICD-10-CM

## 2023-11-04 DIAGNOSIS — E872 Acidosis, unspecified: Secondary | ICD-10-CM | POA: Diagnosis not present

## 2023-11-04 DIAGNOSIS — I4891 Unspecified atrial fibrillation: Secondary | ICD-10-CM

## 2023-11-04 DIAGNOSIS — Z85828 Personal history of other malignant neoplasm of skin: Secondary | ICD-10-CM | POA: Diagnosis not present

## 2023-11-04 DIAGNOSIS — I3139 Other pericardial effusion (noninflammatory): Secondary | ICD-10-CM | POA: Diagnosis not present

## 2023-11-04 DIAGNOSIS — N1831 Chronic kidney disease, stage 3a: Secondary | ICD-10-CM | POA: Diagnosis present

## 2023-11-04 DIAGNOSIS — Z902 Acquired absence of lung [part of]: Secondary | ICD-10-CM | POA: Diagnosis not present

## 2023-11-04 DIAGNOSIS — Z1152 Encounter for screening for COVID-19: Secondary | ICD-10-CM | POA: Diagnosis not present

## 2023-11-04 DIAGNOSIS — J8410 Pulmonary fibrosis, unspecified: Secondary | ICD-10-CM | POA: Diagnosis present

## 2023-11-04 DIAGNOSIS — J90 Pleural effusion, not elsewhere classified: Secondary | ICD-10-CM | POA: Diagnosis present

## 2023-11-04 DIAGNOSIS — Z79890 Hormone replacement therapy (postmenopausal): Secondary | ICD-10-CM | POA: Diagnosis not present

## 2023-11-04 DIAGNOSIS — A0471 Enterocolitis due to Clostridium difficile, recurrent: Secondary | ICD-10-CM | POA: Diagnosis present

## 2023-11-04 DIAGNOSIS — I10 Essential (primary) hypertension: Secondary | ICD-10-CM

## 2023-11-04 DIAGNOSIS — I7300 Raynaud's syndrome without gangrene: Secondary | ICD-10-CM | POA: Diagnosis present

## 2023-11-04 DIAGNOSIS — I129 Hypertensive chronic kidney disease with stage 1 through stage 4 chronic kidney disease, or unspecified chronic kidney disease: Secondary | ICD-10-CM | POA: Diagnosis present

## 2023-11-04 DIAGNOSIS — E039 Hypothyroidism, unspecified: Secondary | ICD-10-CM | POA: Diagnosis present

## 2023-11-04 DIAGNOSIS — A0472 Enterocolitis due to Clostridium difficile, not specified as recurrent: Secondary | ICD-10-CM

## 2023-11-04 DIAGNOSIS — M810 Age-related osteoporosis without current pathological fracture: Secondary | ICD-10-CM | POA: Diagnosis present

## 2023-11-04 DIAGNOSIS — D631 Anemia in chronic kidney disease: Secondary | ICD-10-CM | POA: Diagnosis present

## 2023-11-04 DIAGNOSIS — Z8541 Personal history of malignant neoplasm of cervix uteri: Secondary | ICD-10-CM | POA: Diagnosis not present

## 2023-11-04 DIAGNOSIS — Z853 Personal history of malignant neoplasm of breast: Secondary | ICD-10-CM | POA: Diagnosis not present

## 2023-11-04 DIAGNOSIS — E785 Hyperlipidemia, unspecified: Secondary | ICD-10-CM | POA: Diagnosis present

## 2023-11-04 LAB — ECHOCARDIOGRAM COMPLETE
AR max vel: 1.77 cm2
AV Area VTI: 1.58 cm2
AV Area mean vel: 1.72 cm2
AV Mean grad: 6 mm[Hg]
AV Peak grad: 9.1 mm[Hg]
Ao pk vel: 1.51 m/s
Area-P 1/2: 2.62 cm2
Calc EF: 76.1 %
Height: 64 in
MV VTI: 1.76 cm2
S' Lateral: 2.6 cm
Single Plane A2C EF: 77.2 %
Single Plane A4C EF: 72.8 %
Weight: 1680 [oz_av]

## 2023-11-04 LAB — CBC
HCT: 31.1 % — ABNORMAL LOW (ref 36.0–46.0)
Hemoglobin: 10 g/dL — ABNORMAL LOW (ref 12.0–15.0)
MCH: 30.2 pg (ref 26.0–34.0)
MCHC: 32.2 g/dL (ref 30.0–36.0)
MCV: 94 fL (ref 80.0–100.0)
Platelets: 161 10*3/uL (ref 150–400)
RBC: 3.31 MIL/uL — ABNORMAL LOW (ref 3.87–5.11)
RDW: 15.2 % (ref 11.5–15.5)
WBC: 8.6 10*3/uL (ref 4.0–10.5)
nRBC: 0 % (ref 0.0–0.2)

## 2023-11-04 LAB — BASIC METABOLIC PANEL
Anion gap: 11 (ref 5–15)
BUN: 14 mg/dL (ref 8–23)
CO2: 18 mmol/L — ABNORMAL LOW (ref 22–32)
Calcium: 8.3 mg/dL — ABNORMAL LOW (ref 8.9–10.3)
Chloride: 103 mmol/L (ref 98–111)
Creatinine, Ser: 0.91 mg/dL (ref 0.44–1.00)
GFR, Estimated: 60 mL/min — ABNORMAL LOW (ref >60)
Glucose, Bld: 67 mg/dL — ABNORMAL LOW (ref 70–99)
Potassium: 3.9 mmol/L (ref 3.5–5.1)
Sodium: 132 mmol/L — ABNORMAL LOW (ref 135–145)

## 2023-11-04 LAB — C DIFFICILE QUICK SCREEN W PCR REFLEX
C Diff antigen: POSITIVE — AB
C Diff toxin: NEGATIVE

## 2023-11-04 LAB — CLOSTRIDIUM DIFFICILE BY PCR, REFLEXED: Toxigenic C. Difficile by PCR: POSITIVE — AB

## 2023-11-04 MED ORDER — ONDANSETRON HCL 4 MG PO TABS: 4.0000 mg | ORAL_TABLET | Freq: Four times a day (QID) | ORAL | Status: DC | PRN

## 2023-11-04 MED ORDER — FIDAXOMICIN 200 MG PO TABS
200.0000 mg | ORAL_TABLET | Freq: Two times a day (BID) | ORAL | Status: DC
  Administered 2023-11-04 – 2023-11-06 (×6): 200 mg via ORAL
  Filled 2023-11-04 (×7): qty 1

## 2023-11-04 MED ORDER — ACETAMINOPHEN 325 MG PO TABS: 650.0000 mg | ORAL_TABLET | Freq: Four times a day (QID) | ORAL | Status: DC | PRN

## 2023-11-04 MED ORDER — ACETAMINOPHEN 650 MG RE SUPP: 650.0000 mg | Freq: Four times a day (QID) | RECTAL | Status: DC | PRN

## 2023-11-04 MED ORDER — LACTATED RINGERS IV SOLN: INTRAVENOUS | Status: DC

## 2023-11-04 MED ORDER — ONDANSETRON HCL 4 MG/2ML IJ SOLN: 4.0000 mg | Freq: Four times a day (QID) | INTRAMUSCULAR | Status: DC | PRN

## 2023-11-04 MED ORDER — LEVOTHYROXINE SODIUM 75 MCG PO TABS
75.0000 ug | ORAL_TABLET | Freq: Every day | ORAL | Status: DC
  Administered 2023-11-04 – 2023-11-06 (×3): 75 ug via ORAL
  Filled 2023-11-04 (×3): qty 1

## 2023-11-04 MED ORDER — ENSURE ENLIVE PO LIQD
237.0000 mL | Freq: Two times a day (BID) | ORAL | Status: DC
Start: 2023-11-04 — End: 2023-11-05
  Administered 2023-11-04 – 2023-11-05 (×3): 237 mL via ORAL

## 2023-11-04 MED ORDER — ROSUVASTATIN CALCIUM 5 MG PO TABS: 10.0000 mg | ORAL_TABLET | Freq: Every day | ORAL | Status: DC

## 2023-11-04 MED ORDER — ENOXAPARIN SODIUM 30 MG/0.3ML IJ SOSY
30.0000 mg | PREFILLED_SYRINGE | INTRAMUSCULAR | Status: DC
  Administered 2023-11-04 – 2023-11-06 (×3): 30 mg via SUBCUTANEOUS
  Filled 2023-11-04 (×3): qty 0.3

## 2023-11-04 MED ORDER — LATANOPROST 0.005 % OP SOLN
1.0000 [drp] | Freq: Every day | OPHTHALMIC | Status: DC
  Administered 2023-11-04 – 2023-11-05 (×2): 1 [drp] via OPHTHALMIC
  Filled 2023-11-04: qty 2.5

## 2023-11-04 NOTE — Assessment & Plan Note (Addendum)
S/p lobectomy in April 2024 (2 months before she turned 89!) Med rec pending, looks like she's on tagrisso Ordering 2d echo on patient given the large pericardial effusion on CT scan Looks like known to have malignant pericardial effusion per notes, not clear if todays which is "increased and large" when compared to April 2024 is larger than her known "moderate" effusion that they are following at [facility] most recently in Nov If EF reduced from baseline, probably will want to talk with heme/onc before resuming the Tagrisso.

## 2023-11-04 NOTE — ED Notes (Signed)
[facility] called for transport. 

## 2023-11-04 NOTE — Hospital Course (Addendum)
PMH of NSCLC SP lobectomy in April 2024 now on immunotherapy, chronic known malignant pericardial and pleural effusion, C. difficile colitis, HTN, HLD, pulmonary fibrosis.  Was given antibiotics recently outpatient for 10 days. Now presented with C. difficile colitis.  Also found to have incidental pericardial effusion.  Cardiology was consulted. General surgery was consulted for small bowel intussusception.  Assessment and Plan: C. difficile colitis and intussusception. General Surgery was consulted for intussusception.  Conservative measures were recommended. Currently signed off. Still having diarrhea.  Tolerating soft diet.  Will change Ensure to Glucerna.  1 dose of cholestyramine.  Pericardial effusion. Looks like she was referred to cardiology but has not seen anyone. Echocardiogram showed large posterior pericardial effusion. Recommendation was for a consultation. Cardiology consult appreciated. Outpatient follow-up recommended. Heart rate normal.  Blood pressure normal. Holding Norvasc right now. On clonidine at home currently on hold. Monitor.  Hypothyroidism. Continue Synthroid for now  Adenocarcinoma.  Right lung Holding chemotherapy.  Concern for pneumonia. Went to see PCP on 1/8. Was started on Augmentin for 10 days 875. Likely resulted in C. difficile.  Mild hyponatremia. Monitor.  Leukocytosis. Resolved.  Anemia likely dilutional. Baseline around 11. Currently 10.  Monitor.  Metabolic acidosis. Monitor

## 2023-11-04 NOTE — Consult Note (Signed)
CARDIOLOGY CONSULT NOTE       Patient ID: [name] MRN: [id number] DOB/AGE: 01-22-1931 89 y.o.  Admit date: 11/03/2023 Referring Physician: Julian Reil Primary Physician: Pearline Cables, MD Primary Cardiologist: [facility] Reason for Consultation: Pericardial EFfusion  Principal Problem:   Colitis due to Clostridium difficile Active Problems:   Essential hypertension   Atrial fibrillation (HCC)   Malignant pericardial effusion   Lung cancer (HCC)   HPI:  89 y.o. admitted with bowel complications from C diff. Current colitis and introsusception being Rx medically with no surgery planned. Taking clear liquids today She has metastatic lung cancer. Had RLLobectomy at [facility] in April. Has been followed as outpatient for pericardial effusion. She has no dyspnea, syncope, chest pain or palpitations. She has been Rx with adjuvant Tigrisso. Dr Tomie China note indicates history of atrial tachycardia. She is in NSR her with no PAF  ECG from 01/30/23 read as afib but is sinus with ectopy.  Given her age, metastatic lung disease, pericardial effusion she is not a candidate for anticoagulation anyway. I reviewed her echos from 07/13/23 and 11/03/22. She has an overall moderate pericardial effusion that is large only in the posterior aspect. There is no tamponade and IVC is not dilated and collapse with inspiration.     ROS All other systems reviewed and negative except as noted above  Past Medical History:  Diagnosis Date   Acute postoperative pain 01/21/2023   Adenocarcinoma of lower lobe of right lung (HCC) 01/30/2023   Atrial fibrillation (HCC) 01/30/2023   Autoimmune disease (HCC)    Back pain 05/04/2020   Barrett's esophagus    Basal cell carcinoma (BCC) in situ of skin 04/04/2020   Path 03/2020, [facility]    Bone lesion    sacrum   Breast cancer (HCC) 2010   cT1 N0 M0; ER+/ PR+/ HER2 neg; s/p lumpectomy 07/2010; started adjuvant hormonal therapy 10/2009   C. difficile  diarrhea    Cervical cancer (HCC)    Chronic midline thoracic back pain 12/31/2020   Dizziness 02/22/2019   Dysphagia    Esophageal spasm 09/20/2013   Essential hypertension 03/21/2019   Fracture, shoulder 03/06/2012   Pt seen at [facility] by Dr. Bradly Bienenstock on 03/09/2012 and 03/11/2012.   X-rays taken of left wrist, left elbow, and left shoulder.   Shoulder Fx: nondisplaced proximal humerus fracture involving the greater tuberosity. Wrist: consistent with an old distal radius fracture with shortening present.       Glaucoma    Hoarseness of voice 02/28/2016   Humerus fracture 03/2012   impacted left humueral neck fracture, treated by Dr. Amanda Pea   Hx of meningioma of the brain 08/28/2017   Stable since 2010- 2018 on MRI   Hydropneumothorax 01/30/2023   Hypercalcemia    Hyperlipidemia    Hypothyroidism    Intracranial hemorrhage (HCC)    Laryngopharyngeal reflux 03/19/2016   Orbital fracture, closed, initial encounter (HCC) 04/29/2020   Osteoporosis 03/18/2012   Pancolitis (HCC) 01/30/2023   Paroxysmal SVT (supraventricular tachycardia) (HCC) 12/23/2020   Primary cancer of right lower lobe of lung (HCC) 01/20/2023   PVC (premature ventricular contraction) 09/08/2017   Raynaud's phenomenon 02/28/2016   Rib fractures 11/21/2022   Right lower lobe lung mass 11/21/2022   SAH (subarachnoid hemorrhage) (HCC) 04/29/2020   Squamous cell carcinoma in situ of skin 11/30/2018   Path 11/1008, Dermatology managing    Stage 3a chronic kidney disease    Status post partial lobectomy of lung 01/20/2023  lower right lobe   Systolic murmur 02/23/2019   TBI (traumatic brain injury) (HCC) 05/04/2020    Family History  Problem Relation Age of Onset   Heart disease Father    Cancer Sister        lung, kidney   Cancer Brother        brain   Cancer Paternal Aunt        breast   Breast cancer Paternal Aunt    Cancer Brother        liver, lung   Osteoporosis Neg Hx     Social  History   Socioeconomic History   Marital status: Married    Spouse name: Not on file   Number of children: 2   Years of education: 12   Highest education level: Not on file  Occupational History   Occupation: retired  Tobacco Use   Smoking status: Former    Current packs/day: 0.00    Types: Cigarettes    Quit date: 12/16/1986    Years since quitting: 36.9   Smokeless tobacco: Never   Tobacco comments:    FORMER SMOKER 30 YEARS AGO  Vaping Use   Vaping status: Never Used  Substance and Sexual Activity   Alcohol use: Yes    Alcohol/week: 0.0 standard drinks of alcohol    Comment: OCCASIONALLY   Drug use: No   Sexual activity: Not on file  Other Topics Concern   Not on file  Social History Narrative   Married   Exercise: Yes   Patient lives with husband in a one story home.  Has 2 daughters.  Retired from working in Marine scientist.  Education: high school.     Right handed    Social Drivers of Health   Financial Resource Strain: Low Risk  (02/11/2023)   Received from [facility], [facility]   Overall Financial Resource Strain (CARDIA)    Difficulty of Paying Living Expenses: Not very hard  Food Insecurity: No Food Insecurity (11/04/2023)   Hunger Vital Sign    Worried About Running Out of Food in the Last Year: Never true    Ran Out of Food in the Last Year: Never true  Transportation Needs: No Transportation Needs (11/04/2023)   PRAPARE - Administrator, Civil Service (Medical): No    Lack of Transportation (Non-Medical): No  Physical Activity: Inactive (01/10/2021)   Exercise Vital Sign    Days of Exercise per Week: 0 days    Minutes of Exercise per Session: 0 min  Stress: No Stress Concern Present (01/16/2022)   [employer] of Occupational Health - Occupational Stress Questionnaire    Feeling of Stress : Not at all  Social Connections: Socially Integrated (11/04/2023)   Social Connection and Isolation Panel [NHANES]     Frequency of Communication with Friends and Family: More than three times a week    Frequency of Social Gatherings with Friends and Family: Twice a week    Attends Religious Services: More than 4 times per year    Active Member of [employer] or Organizations: Yes    Attends Banker Meetings: More than 4 times per year    Marital Status: Married  Catering manager Violence: Not At Risk (11/04/2023)   Humiliation, Afraid, Rape, and Kick questionnaire    Fear of Current or Ex-Partner: No    Emotionally Abused: No    Physically Abused: No    Sexually Abused: No    Past Surgical History:  Procedure Laterality Date   ABDOMINAL HYSTERECTOMY  1972   cancer          1 tube 1 ovary   APPENDECTOMY     BACK SURGERY  11/28/2020   BREAST LUMPECTOMY Left 2010   colon polyp removal     EYE SURGERY     HAND SURGERY     KYPHOPLASTY N/A 11/28/2020   Procedure: KYPHOPLASTY THORACIC TWELVE AND LUMBAR ONE;  Surgeon: Venita Lick, MD;  Location: [facility] OR;  Service: Orthopedics;  Laterality: N/A;  90 mins   LOBECTOMY Right 01/20/2023   right lower lobe   ortho procedures     multiple   OVARIAN CYST REMOVAL  1978   SHOULDER SURGERY  1954   left      Current Facility-Administered Medications:    acetaminophen (TYLENOL) tablet 650 mg, 650 mg, Oral, Q6H PRN **OR** acetaminophen (TYLENOL) suppository 650 mg, 650 mg, Rectal, Q6H PRN, Julian Reil, Jared M, DO   enoxaparin (LOVENOX) injection 30 mg, 30 mg, Subcutaneous, Q24H, Julian Reil, Jared M, DO, 30 mg at 11/04/23 0856   feeding supplement (ENSURE ENLIVE / ENSURE PLUS) liquid 237 mL, 237 mL, Oral, BID BM, [name], MD, 237 mL at 11/04/23 1548   fidaxomicin (DIFICID) tablet 200 mg, 200 mg, Oral, BID, Julian Reil, Jared M, DO, 200 mg at 11/04/23 0858   latanoprost (XALATAN) 0.005 % ophthalmic solution 1 drop, 1 drop, Both Eyes, QHS, Patel, Zachery Conch, MD   levothyroxine (SYNTHROID) tablet 75 mcg, 75 mcg, Oral, Q0600, [name], MD, 75 mcg at 11/04/23  0856   ondansetron (ZOFRAN) tablet 4 mg, 4 mg, Oral, Q6H PRN **OR** ondansetron (ZOFRAN) injection 4 mg, 4 mg, Intravenous, Q6H PRN, Julian Reil, Jared M, DO  enoxaparin (LOVENOX) injection  30 mg Subcutaneous Q24H   feeding supplement  237 mL Oral BID BM   fidaxomicin  200 mg Oral BID   latanoprost  1 drop Both Eyes QHS   levothyroxine  75 mcg Oral Q0600     Physical Exam: Blood pressure 130/81, pulse 68, temperature 97.6 F (36.4 C), resp. rate 18, height 5\' 4"  (1.626 m), weight 47.6 kg, SpO2 100%.    Elderly female No JVP elevation  Prior RLL obectomy No murmur or rub Abdomen benign not distended No edema  Labs:   Lab Results  Component Value Date   WBC 8.6 11/04/2023   HGB 10.0 (L) 11/04/2023   HCT 31.1 (L) 11/04/2023   MCV 94.0 11/04/2023   PLT 161 11/04/2023    Recent Labs  Lab 11/03/23 1617 11/04/23 0554  NA 134* 132*  K 4.1 3.9  CL 100 103  CO2 22 18*  BUN 21 14  CREATININE 0.95 0.91  CALCIUM 9.7 8.3*  PROT 7.4  --   BILITOT 0.7  --   ALKPHOS 90  --   ALT 27  --   AST 36  --   GLUCOSE 89 67*   Lab Results  Component Value Date   TROPONINI 0.11 (HH) 03/02/2019    Lab Results  Component Value Date   CHOL 178 07/31/2022   CHOL 169 05/23/2019   CHOL 176 02/17/2018   Lab Results  Component Value Date   HDL 82.90 07/31/2022   HDL 78.70 05/23/2019   HDL 79.60 02/17/2018   Lab Results  Component Value Date   LDLCALC 78 07/31/2022   LDLCALC 80 05/23/2019   LDLCALC 83 02/17/2018   Lab Results  Component Value Date   TRIG 85.0 07/31/2022  TRIG 50.0 05/23/2019   TRIG 68.0 02/17/2018   Lab Results  Component Value Date   CHOLHDL 2 07/31/2022   CHOLHDL 2 05/23/2019   CHOLHDL 2 02/17/2018   No results found for: "LDLDIRECT"    Radiology: ECHOCARDIOGRAM COMPLETE Result Date: 11/04/2023    ECHOCARDIOGRAM REPORT   Patient Name:   [name] Date of Exam: 11/04/2023 Medical Rec #:  [id number]     Height:       64.0 in Accession #:     [id number]    Weight:       105.0 lb Date of Birth:  1931-06-15     BSA:          1.488 m Patient Age:    89 years      BP:           130/81 mmHg Patient Gender: F             HR:           90 bpm. Exam Location:  Inpatient Procedure: 2D Echo, Color Doppler and Cardiac Doppler  Results communicated to Dr Allena Katz at 1446 on 11/04/23. Indications:    Pericardial Effusion  History:        Patient has prior history of Echocardiogram examinations, most                 recent 07/13/2023. Arrythmias:Atrial Fibrillation and PVC; Risk                 Factors:Hypertension and Lung Cancer.  Sonographer:    Amy Chionchio Referring Phys: [id number] JARED M GARDNER IMPRESSIONS  1. Large pericardial effusion. Measures 2.5cm adjacent to LV posterior wall. No evidence of tamponade. There is significant MV inflow respiratory variation, but difficult to interpret in setting of frequent PVCs. No RA/RV collapse seen. IVC is small though does not collapse with inspiration.  2. Left ventricular ejection fraction, by estimation, is 60 to 65%. The left ventricle has normal function. The left ventricle has no regional wall motion abnormalities. There is mild left ventricular hypertrophy. Left ventricular diastolic parameters are indeterminate.  3. Right ventricular systolic function is normal. The right ventricular size is normal. There is mildly elevated pulmonary artery systolic pressure. The estimated right ventricular systolic pressure is 39.8 mmHg.  4. The mitral valve is normal in structure. No evidence of mitral valve regurgitation. No evidence of mitral stenosis.  5. The aortic valve is tricuspid. Aortic valve regurgitation is not visualized. No aortic stenosis is present.  6. The inferior vena cava is normal in size with <50% respiratory variability, suggesting right atrial pressure of 8 mmHg. FINDINGS  Left Ventricle: Left ventricular ejection fraction, by estimation, is 60 to 65%. The left ventricle has normal function. The left ventricle  has no regional wall motion abnormalities. The left ventricular internal cavity size was small. There is mild left ventricular hypertrophy. Left ventricular diastolic parameters are indeterminate. Right Ventricle: The right ventricular size is normal. No increase in right ventricular wall thickness. Right ventricular systolic function is normal. There is mildly elevated pulmonary artery systolic pressure. The tricuspid regurgitant velocity is 2.82  m/s, and with an assumed right atrial pressure of 8 mmHg, the estimated right ventricular systolic pressure is 39.8 mmHg. Left Atrium: Left atrial size was normal in size. Right Atrium: Right atrial size was normal in size. Pericardium: A large pericardial effusion is present. Mitral Valve: The mitral valve is normal in structure. No evidence of mitral valve regurgitation. No evidence  of mitral valve stenosis. MV peak gradient, 4.7 mmHg. The mean mitral valve gradient is 2.0 mmHg. Tricuspid Valve: The tricuspid valve is normal in structure. Tricuspid valve regurgitation is trivial. Aortic Valve: The aortic valve is tricuspid. Aortic valve regurgitation is not visualized. No aortic stenosis is present. Aortic valve mean gradient measures 6.0 mmHg. Aortic valve peak gradient measures 9.1 mmHg. Aortic valve area, by VTI measures 1.58 cm. Pulmonic Valve: The pulmonic valve was not well visualized. Pulmonic valve regurgitation is trivial. Aorta: The aortic root is normal in size and structure. Venous: The inferior vena cava is normal in size with less than 50% respiratory variability, suggesting right atrial pressure of 8 mmHg. IAS/Shunts: The interatrial septum was not well visualized.  LEFT VENTRICLE PLAX 2D LVIDd:         3.30 cm     Diastology LVIDs:         2.60 cm     LV e' medial:    7.40 cm/s LV PW:         1.00 cm     LV E/e' medial:  10.3 LV IVS:        1.10 cm     LV e' lateral:   6.31 cm/s LVOT diam:     1.90 cm     LV E/e' lateral: 12.1 LV SV:         48 LV SV  Index:   32 LVOT Area:     2.84 cm  LV Volumes (MOD) LV vol d, MOD A2C: 44.8 ml LV vol d, MOD A4C: 50.8 ml LV vol s, MOD A2C: 10.2 ml LV vol s, MOD A4C: 13.8 ml LV SV MOD A2C:     34.6 ml LV SV MOD A4C:     50.8 ml LV SV MOD BP:      36.7 ml RIGHT VENTRICLE             IVC RV Basal diam:  2.60 cm     IVC diam: 1.90 cm RV S prime:     19.60 cm/s TAPSE (M-mode): 1.4 cm LEFT ATRIUM             Index        RIGHT ATRIUM           Index LA Vol (A2C):   30.8 ml 20.70 ml/m  RA Area:     13.80 cm LA Vol (A4C):   25.9 ml 17.41 ml/m  RA Volume:   33.40 ml  22.45 ml/m LA Biplane Vol: 28.7 ml 19.29 ml/m  AORTIC VALVE                     PULMONIC VALVE AV Area (Vmax):    1.77 cm      PV Vmax:       0.77 m/s AV Area (Vmean):   1.72 cm      PV Peak grad:  2.4 mmHg AV Area (VTI):     1.58 cm AV Vmax:           151.00 cm/s AV Vmean:          114.000 cm/s AV VTI:            0.301 m AV Peak Grad:      9.1 mmHg AV Mean Grad:      6.0 mmHg LVOT Vmax:         94.00 cm/s LVOT Vmean:        69.300 cm/s LVOT VTI:  0.168 m LVOT/AV VTI ratio: 0.56  AORTA Ao Root diam: 2.50 cm MITRAL VALVE               TRICUSPID VALVE MV Area (PHT): 2.62 cm    TR Peak grad:   31.8 mmHg MV Area VTI:   1.76 cm    TR Vmax:        282.00 cm/s MV Peak grad:  4.7 mmHg MV Mean grad:  2.0 mmHg    SHUNTS MV Vmax:       1.08 m/s    Systemic VTI:  0.17 m MV Vmean:      57.1 cm/s   Systemic Diam: 1.90 cm MV Decel Time: 289 msec MV E velocity: 76.30 cm/s MV A velocity: 79.30 cm/s MV E/A ratio:  0.96 Epifanio Lesches MD Electronically signed by Epifanio Lesches MD Signature Date/Time: 11/04/2023/2:47:20 PM    Final    CT ABDOMEN PELVIS W CONTRAST Result Date: 11/03/2023 CLINICAL DATA:  Diarrhea since Friday. Back pain. History of breast cancer and lung cancer status post lumpectomy. Hysterectomy. EXAM: CT ABDOMEN AND PELVIS WITH CONTRAST TECHNIQUE: Multidetector CT imaging of the abdomen and pelvis was performed using the standard protocol  following bolus administration of intravenous contrast. RADIATION DOSE REDUCTION: This exam was performed according to the departmental dose-optimization program which includes automated exposure control, adjustment of the mA and/or kV according to patient size and/or use of iterative reconstruction technique. CONTRAST:  90mL OMNIPAQUE IOHEXOL 300 MG/ML  SOLN COMPARISON:  CT 01/30/2023 FINDINGS: Lower chest: Increased large pericardial effusion. Similar small right pleural effusion. Chronic interstitial lung disease is unchanged. Hepatobiliary: No acute abnormality. Pancreas: Unremarkable. Spleen: Unremarkable. Adrenals/Urinary Tract: Stable adrenal glands and kidneys. No urinary calculi or hydronephrosis. Unremarkable bladder. Stomach/Bowel: Diffuse colonic wall thickening with mucosal hyperenhancement, slightly improved compared to 01/30/2023. Small bowel small bowel intussusception in the left abdomen (series 601/image 80). No evidence of lead point. No obstruction. Stomach is within normal limits. Vascular/Lymphatic: Aortic atherosclerosis. No enlarged abdominal or pelvic lymph nodes. Reproductive: Hysterectomy.  No adnexal mass. Other: No free intraperitoneal fluid or air. Musculoskeletal: New age-indeterminate mild compression of the superior endplate of L4 compared with 01/30/2023. No retropulsion. Chronic compression fractures of T11-L1. Kyphoplasty T12 and L1. IMPRESSION: 1. Diffuse colonic wall thickening with mucosal hyperenhancement, slightly improved compared to 01/30/2023. Findings are consistent with colitis. 2. New age-indeterminate mild compression of the superior endplate of L4 compared with 01/30/2023. No retropulsion. 3. Small bowel-small bowel intussusception in the left abdomen. This may be transient however short interval follow up CT is recommended to ensure resolution and exclude a neoplastic lead point. 4. Increased large pericardial effusion. 5. Similar small right pleural effusion. Aortic  Atherosclerosis (ICD10-I70.0). Electronically Signed   By: Minerva Fester M.D.   On: 11/03/2023 20:46   DG Chest 2 View Result Date: 10/21/2023 CLINICAL DATA:  Pleural and pericardial disease. EXAM: CHEST - 2 VIEW COMPARISON:  January 31, 2023. FINDINGS: Stable cardiomegaly. Minimal left basilar subsegmental atelectasis or scarring is noted. Mildly increased right basilar opacity is noted concerning for worsening atelectasis or possibly infiltrate. Probable scarring is noted laterally in right upper lobe. Thoracic kyphosis is noted. Status post kyphoplasty of 2 lower thoracic vertebral bodies. IMPRESSION: Mildly increased right basilar opacity is noted concerning for worsening atelectasis or possibly infiltrate with possible small pleural effusion or pleural thickening. Electronically Signed   By: Lupita Raider M.D.   On: 10/21/2023 10:45    EKG: SR unifocal PVC no signs of  pericarditis   ASSESSMENT AND PLAN:   Pericardial effusion:  large only in posterior aspect. Given how long she has had it and lack of tamponade doubt it is malignant. Diagnostically could tap right pleural effusion to see if that is malignant. She has no cardiac symptoms, no echo signs of tamponade and the posterior effusion would require a pericardial window to tab. This is currently not indicated. TKI inhibitor Tigrasso is more associated with long QT and cardiomyopathy Her QT is normal and her EF is normal. Only 0.6% incidence of worsening pericardial effusion so I would continue this Rx Arrhythmia:  I see no evidence of PAF and she is not a candidate for anticoagulation. Atrial ectopy common with effusion no need for outpatient monitor ECG with normal QT Thyroid:  on replacement TSH normal this admission Colitis:  slowly improving non surgical full liquid diet today  Lung Cancer:  follows at [facility] where her daughter works   Patient has a f/u with [facility] cardiology next Tuesday  No need for further cardiac w/u this admission    Will sign off  Signed: Charlton Haws 11/04/2023, 4:33 PM

## 2023-11-04 NOTE — Plan of Care (Signed)

## 2023-11-04 NOTE — Progress Notes (Signed)
[facility] PLAN OF CARE NOTE Patient: [name] VZD:[id number]   PCP: Pearline Cables, MD DOB: [birth year]   DOA: 11/03/2023   DOS: 11/04/2023    Patient was admitted by my colleague earlier on 11/04/2023. I have reviewed the H&P as well as assessment and plan and agree with the same. Important changes in the plan are listed below.  Plan of care: Principal Problem:   Colitis due to Clostridium difficile Active Problems:   Lung cancer (HCC)   Essential hypertension   Atrial fibrillation (HCC)   Malignant pericardial effusion Diarrhea unchanged. Intussusception incidental finding. Since no obstructions symptoms, general surgery recommended to advance diet as tolerated. Will advance to soft diet. Continue fidaxomicin.  Pericardial effusion. Appreciate cardiology consultation. Outpatient follow-up recommended.  Pleural effusion. Right lung cancer. May need thoracentesis.  Level of care: Telemetry Medical Continue  Author: Lynden Oxford, MD  [facility] 11/04/2023 5:39 PM   If 7PM-7AM, please contact night-coverage at [email or web address]

## 2023-11-04 NOTE — Plan of Care (Signed)
  Problem: Education: Goal: Knowledge of General Education information will improve Description: Including pain rating scale, medication(s)/side effects and non-pharmacologic comfort measures Outcome: Progressing   Problem: Health Behavior/Discharge Planning: Goal: Ability to manage health-related needs will improve Outcome: Progressing   Problem: Clinical Measurements: Goal: Ability to maintain clinical measurements within normal limits will improve Outcome: Progressing Goal: Will remain free from infection Outcome: Progressing Goal: Diagnostic test results will improve Outcome: Progressing Goal: Respiratory complications will improve Outcome: Progressing   Problem: Clinical Measurements: Goal: Will remain free from infection Outcome: Progressing   Problem: Clinical Measurements: Goal: Diagnostic test results will improve Outcome: Progressing

## 2023-11-04 NOTE — Progress Notes (Signed)
Echo at bedside. Instructed nurse to place consult when patient available for VAST for PIV assessment/placement. Tomasita Morrow, RN VAST

## 2023-11-04 NOTE — H&P (Signed)
History and Physical    Patient: [name] ION:[id number] DOB: [birth year] DOA: 11/03/2023 DOS: the patient was seen and examined on 11/04/2023 PCP: Pearline Cables, MD  Patient coming from: Home  Chief Complaint:  Chief Complaint  Patient presents with   Diarrhea   HPI: [name] is a 89 y.o. female with medical history significant of NSCLC s/p lobectomy in April 2024.  Sounds like still has some active dz though with known malignant pericardial effusion / pleural effusion.  C.diff diarrhea in April 2024.  HTN.  Pt recently completed course of Augmentin x2 days ago for presumed lung infection.  4 days ago she had onset of diarrhea.  Foul-smelling, watery, about 1 time an hour.  She was concerned about recurrent c.diff given h/o same in April 2024.  Presented to ED.  No vomiting, occasional crampy abdominal pain but nothing continuous or severe.  Workup in ED: Is indeed c/w c.diff, mild fever, leukocytosis, positive C.diff testing, pancolitis on CT scan.   Review of Systems: As mentioned in the history of present illness. All other systems reviewed and are negative. Past Medical History:  Diagnosis Date   Acute postoperative pain 01/21/2023   Adenocarcinoma of lower lobe of right lung (HCC) 01/30/2023   Atrial fibrillation (HCC) 01/30/2023   Autoimmune disease (HCC)    Back pain 05/04/2020   Barrett's esophagus    Basal cell carcinoma (BCC) in situ of skin 04/04/2020   Path 03/2020, [facility]    Bone lesion    sacrum   Breast cancer (HCC) 2010   cT1 N0 M0; ER+/ PR+/ HER2 neg; s/p lumpectomy 07/2010; started adjuvant hormonal therapy 10/2009   C. difficile diarrhea    Cervical cancer (HCC)    Chronic midline thoracic back pain 12/31/2020   Dizziness 02/22/2019   Dysphagia    Esophageal spasm 09/20/2013   Essential hypertension 03/21/2019   Fracture, shoulder 03/06/2012   Pt seen at [facility] by Dr. Bradly Bienenstock on 03/09/2012 and 03/11/2012.    X-rays taken of left wrist, left elbow, and left shoulder.   Shoulder Fx: nondisplaced proximal humerus fracture involving the greater tuberosity. Wrist: consistent with an old distal radius fracture with shortening present.       Glaucoma    Hoarseness of voice 02/28/2016   Humerus fracture 03/2012   impacted left humueral neck fracture, treated by Dr. Amanda Pea   Hx of meningioma of the brain 08/28/2017   Stable since 2010- 2018 on MRI   Hydropneumothorax 01/30/2023   Hypercalcemia    Hyperlipidemia    Hypothyroidism    Intracranial hemorrhage (HCC)    Laryngopharyngeal reflux 03/19/2016   Orbital fracture, closed, initial encounter (HCC) 04/29/2020   Osteoporosis 03/18/2012   Pancolitis (HCC) 01/30/2023   Paroxysmal SVT (supraventricular tachycardia) (HCC) 12/23/2020   Primary cancer of right lower lobe of lung (HCC) 01/20/2023   PVC (premature ventricular contraction) 09/08/2017   Raynaud's phenomenon 02/28/2016   Rib fractures 11/21/2022   Right lower lobe lung mass 11/21/2022   SAH (subarachnoid hemorrhage) (HCC) 04/29/2020   Squamous cell carcinoma in situ of skin 11/30/2018   Path 11/1008, Dermatology managing    Stage 3a chronic kidney disease    Status post partial lobectomy of lung 01/20/2023   lower right lobe   Systolic murmur 02/23/2019   TBI (traumatic brain injury) (HCC) 05/04/2020   Past Surgical History:  Procedure Laterality Date   ABDOMINAL HYSTERECTOMY  1972   cancer  1 tube 1 ovary   APPENDECTOMY     BACK SURGERY  11/28/2020   BREAST LUMPECTOMY Left 2010   colon polyp removal     EYE SURGERY     HAND SURGERY     KYPHOPLASTY N/A 11/28/2020   Procedure: KYPHOPLASTY THORACIC TWELVE AND LUMBAR ONE;  Surgeon: Venita Lick, MD;  Location: [facility] OR;  Service: Orthopedics;  Laterality: N/A;  90 mins   LOBECTOMY Right 01/20/2023   right lower lobe   ortho procedures     multiple   OVARIAN CYST REMOVAL  1978   SHOULDER SURGERY  1954   left   Social  History:  reports that she quit smoking about 36 years ago. Her smoking use included cigarettes. She has never used smokeless tobacco. She reports current alcohol use. She reports that she does not use drugs.  Allergies  Allergen Reactions   Daypro [Oxaprozin] Swelling    Face and tongue   Prolia [Denosumab]     unknown    Family History  Problem Relation Age of Onset   Heart disease Father    Cancer Sister        lung, kidney   Cancer Brother        brain   Cancer Paternal Aunt        breast   Breast cancer Paternal Aunt    Cancer Brother        liver, lung   Osteoporosis Neg Hx     Prior to Admission medications   Medication Sig Start Date End Date Taking? Authorizing Provider  acetaminophen (TYLENOL) 500 MG tablet Take 2 tablets (1,000 mg total) by mouth 3 (three) times daily as needed for mild pain. 11/24/22   Hongalgi, Maximino Greenland, MD  amLODipine (NORVASC) 2.5 MG tablet Take 1 tablet (2.5 mg total) by mouth daily. 03/23/23   Copland, [name], MD  amoxicillin-clavulanate (AUGMENTIN) 875-125 MG tablet Take 1 tablet by mouth 2 (two) times daily. 10/21/23   Copland, [name], MD  Calcium Carbonate-Vitamin D (CALCIUM + D PO) Take 1 capsule by mouth daily.     [provider]  cloNIDine (CATAPRES) 0.1 MG tablet Take 1 tablet (0.1 mg total) by mouth daily. Use if needed for vocal tic 12/03/22   Copland, [name], MD  latanoprost (XALATAN) 0.005 % ophthalmic solution Place 1 drop into both eyes at bedtime.     [provider]  levothyroxine (SYNTHROID) 75 MCG tablet TAKE 1 TABLET BY MOUTH DAILY BEFORE BREAKFAST 11/03/22   Copland, [name], MD  osimertinib mesylate (TAGRISSO) 40 MG tablet Take 40 mg by mouth every other day.    [provider]  rosuvastatin (CRESTOR) 10 MG tablet Take 1 tablet (10 mg total) by mouth daily. TAKE 1 BY MOUTH DAILY 03/23/23   Copland, [name], MD    Physical Exam: Vitals:   11/04/23 0030 11/04/23 0045 11/04/23 0046 11/04/23  0124  BP: 124/70 124/76  132/72  Pulse: 63 63  65  Resp: (!) 22 (!) 27  18  Temp:   97.9 F (36.6 C) 97.7 F (36.5 C)  TempSrc:   Oral Oral  SpO2: 97% 98%  99%  Weight:      Height:       Constitutional: NAD, calm, comfortable Respiratory: clear to auscultation bilaterally, no wheezing, no crackles. Normal respiratory effort. No accessory muscle use.  Cardiovascular: Regular rate and rhythm, no murmurs / rubs / gallops. No extremity edema. 2+ pedal pulses. No carotid bruits.  Abdomen: no tenderness, no masses palpated. No hepatosplenomegaly. Bowel sounds positive.  Neurologic: CN 2-12 grossly intact. Sensation intact, DTR normal. Strength 5/5 in all 4.  Psychiatric: Normal judgment and insight. Alert and oriented x 3. Normal mood.   Data Reviewed:    Labs on Admission: I have personally reviewed following labs and imaging studies  CBC: Recent Labs  Lab 11/03/23 1617  WBC 16.5*  HGB 11.0*  HCT 34.3*  MCV 92.5  PLT 167   Basic Metabolic Panel: Recent Labs  Lab 11/03/23 1617  NA 134*  K 4.1  CL 100  CO2 22  GLUCOSE 89  BUN 21  CREATININE 0.95  CALCIUM 9.7  MG 1.8   GFR: Estimated Creatinine Clearance: 29.6 mL/min (by C-G formula based on SCr of 0.95 mg/dL). Liver Function Tests: Recent Labs  Lab 11/03/23 1617  AST 36  ALT 27  ALKPHOS 90  BILITOT 0.7  PROT 7.4  ALBUMIN 3.7   Recent Labs  Lab 11/03/23 1617  LIPASE 26   No results for input(s): "AMMONIA" in the last 168 hours. Coagulation Profile: No results for input(s): "INR", "PROTIME" in the last 168 hours. Cardiac Enzymes: No results for input(s): "CKTOTAL", "CKMB", "CKMBINDEX", "TROPONINI" in the last 168 hours. BNP (last 3 results) No results for input(s): "PROBNP" in the last 8760 hours. HbA1C: No results for input(s): "HGBA1C" in the last 72 hours. CBG: No results for input(s): "GLUCAP" in the last 168 hours. Lipid Profile: No results for input(s): "CHOL", "HDL", "LDLCALC", "TRIG",  "CHOLHDL", "LDLDIRECT" in the last 72 hours. Thyroid Function Tests: No results for input(s): "TSH", "T4TOTAL", "FREET4", "T3FREE", "THYROIDAB" in the last 72 hours. Anemia Panel: No results for input(s): "VITAMINB12", "FOLATE", "FERRITIN", "TIBC", "IRON", "RETICCTPCT" in the last 72 hours. Urine analysis:    Component Value Date/Time   COLORURINE YELLOW 11/03/2023 2258   APPEARANCEUR CLEAR 11/03/2023 2258   LABSPEC 1.010 11/03/2023 2258   LABSPEC 1.010 06/05/2010 1019   PHURINE 6.0 11/03/2023 2258   GLUCOSEU NEGATIVE 11/03/2023 2258   HGBUR TRACE (A) 11/03/2023 2258   BILIRUBINUR NEGATIVE 11/03/2023 2258   BILIRUBINUR negative 02/28/2016 0945   BILIRUBINUR Negative 06/05/2010 1019   KETONESUR NEGATIVE 11/03/2023 2258   PROTEINUR NEGATIVE 11/03/2023 2258   UROBILINOGEN 0.2 02/28/2016 0945   UROBILINOGEN 0.2 08/24/2014 1425   NITRITE NEGATIVE 11/03/2023 2258   LEUKOCYTESUR NEGATIVE 11/03/2023 2258   LEUKOCYTESUR Negative 06/05/2010 1019    Radiological Exams on Admission: CT ABDOMEN PELVIS W CONTRAST Result Date: 11/03/2023 CLINICAL DATA:  Diarrhea since Friday. Back pain. History of breast cancer and lung cancer status post lumpectomy. Hysterectomy. EXAM: CT ABDOMEN AND PELVIS WITH CONTRAST TECHNIQUE: Multidetector CT imaging of the abdomen and pelvis was performed using the standard protocol following bolus administration of intravenous contrast. RADIATION DOSE REDUCTION: This exam was performed according to the departmental dose-optimization program which includes automated exposure control, adjustment of the mA and/or kV according to patient size and/or use of iterative reconstruction technique. CONTRAST:  90mL OMNIPAQUE IOHEXOL 300 MG/ML  SOLN COMPARISON:  CT 01/30/2023 FINDINGS: Lower chest: Increased large pericardial effusion. Similar small right pleural effusion. Chronic interstitial lung disease is unchanged. Hepatobiliary: No acute abnormality. Pancreas: Unremarkable. Spleen:  Unremarkable. Adrenals/Urinary Tract: Stable adrenal glands and kidneys. No urinary calculi or hydronephrosis. Unremarkable bladder. Stomach/Bowel: Diffuse colonic wall thickening with mucosal hyperenhancement, slightly improved compared to 01/30/2023. Small bowel small bowel intussusception in the left abdomen (series 601/image 80). No evidence of lead point. No obstruction. Stomach is within normal limits.  Vascular/Lymphatic: Aortic atherosclerosis. No enlarged abdominal or pelvic lymph nodes. Reproductive: Hysterectomy.  No adnexal mass. Other: No free intraperitoneal fluid or air. Musculoskeletal: New age-indeterminate mild compression of the superior endplate of L4 compared with 01/30/2023. No retropulsion. Chronic compression fractures of T11-L1. Kyphoplasty T12 and L1. IMPRESSION: 1. Diffuse colonic wall thickening with mucosal hyperenhancement, slightly improved compared to 01/30/2023. Findings are consistent with colitis. 2. New age-indeterminate mild compression of the superior endplate of L4 compared with 01/30/2023. No retropulsion. 3. Small bowel-small bowel intussusception in the left abdomen. This may be transient however short interval follow up CT is recommended to ensure resolution and exclude a neoplastic lead point. 4. Increased large pericardial effusion. 5. Similar small right pleural effusion. Aortic Atherosclerosis (ICD10-I70.0). Electronically Signed   By: Minerva Fester M.D.   On: 11/03/2023 20:46    EKG: Independently reviewed.   Assessment and Plan: * Colitis due to Clostridium difficile Pt with diarrhea, fever, leukocytosis, pancolitis on CT scan, just finished course of ABx, h/o c.diff in April 2024. Pt testing positive for c.diff today.  C.Diff does fit the clinical picture of her presentation. Fidaxomycin, first dose now Tylenol for fever Avoid imodium Repeat CBC in AM to trend  Question of Intussuception on CT: DW Dr. Hillery Hunter: Believed to be transient SB-SB, and not  represent pathologic intussuception. No findings of SBO Symptoms not really consistent with SBO. IVF Sips with meds for the moment Will hold off on ordering diet until gen surg clears her  Lung cancer [facility]) S/p lobectomy in April 2024 (2 months before she turned 89!) Med rec pending, looks like she's on tagrisso Ordering 2d echo on patient given the large pericardial effusion on CT scan Looks like known to have malignant pericardial effusion per notes, not clear if todays which is "increased and large" when compared to April 2024 is larger than her known "moderate" effusion that they are following at [facility] most recently in Nov If EF reduced from baseline, probably will want to talk with heme/onc before resuming the Tagrisso.  Atrial fibrillation (HCC) A.Fib listed on chart, but dont see that she's on any AC meds nor rate control meds chronically. Maybe no AC due to h/o SAH in past? Med rec pending. Ill just put her on DVT ppx lovenox for the moment while awaiting med rec  Essential hypertension Med rec pending. Plan to continue home BP meds      Advance Care Planning:   Code Status: Full Code  Consults: Dr. Hillery Hunter  Family Communication: No family in room  Severity of Illness: The appropriate patient status for this patient is INPATIENT. Inpatient status is judged to be reasonable and necessary in order to provide the required intensity of service to ensure the patient's safety. The patient's presenting symptoms, physical exam findings, and initial radiographic and laboratory data in the context of their chronic comorbidities is felt to place them at high risk for further clinical deterioration. Furthermore, it is not anticipated that the patient will be medically stable for discharge from the hospital within 2 midnights of admission.   * I certify that at the point of admission it is my clinical judgment that the patient will require inpatient hospital care spanning beyond 2  midnights from the point of admission due to high intensity of service, high risk for further deterioration and high frequency of surveillance required.*  Author: Hillary Bow., DO 11/04/2023 4:47 AM  For on call review [email or web address].

## 2023-11-04 NOTE — Telephone Encounter (Signed)
Patient Product/process development scientist completed.    The patient is insured through [facility]. Patient has Medicare and is not eligible for a copay card, but may be able to apply for patient assistance or Medicare RX Payment Plan (Patient Must reach out to their plan, if eligible for payment plan), if available.    Ran test claim for Dificid 200 mg and the current 10 day co-pay is $1,626.39.   This test claim was processed through [facility]- copay amounts may vary at other pharmacies due to pharmacy/plan contracts, or as the patient moves through the different stages of their insurance plan.     Roland Earl, CPHT Pharmacy Technician III Certified Patient Advocate [facility] Patient Advocate Team Direct Number: [phone]  Fax: [phone]

## 2023-11-04 NOTE — Telephone Encounter (Signed)
Patient Advocate Encounter  Patient is approved through  Clostridium Difficile-Associated Diarrhea Copay Assistance Programfor Dificid through [employer].      Roland Earl, CPhT Pharmacy Patient Advocate Specialist [facility] Patient Advocate Team Direct Number: [phone]  Fax: [phone]

## 2023-11-04 NOTE — Assessment & Plan Note (Addendum)
Pt with diarrhea, fever, leukocytosis, pancolitis on CT scan, just finished course of ABx, h/o c.diff in April 2024. Pt testing positive for c.diff today.  C.Diff does fit the clinical picture of her presentation. Fidaxomycin, first dose now Tylenol for fever Avoid imodium Repeat CBC in AM to trend  Question of Intussuception on CT: DW Dr. Hillery Hunter: Believed to be transient SB-SB, and not represent pathologic intussuception. No findings of SBO Symptoms not really consistent with SBO. IVF Sips with meds for the moment Will hold off on ordering diet until gen surg clears her

## 2023-11-04 NOTE — Assessment & Plan Note (Addendum)
A.Fib listed on chart, but dont see that she's on any AC meds nor rate control meds chronically. Maybe no AC due to h/o SAH in past? Med rec pending. Ill just put her on DVT ppx lovenox for the moment while awaiting med rec

## 2023-11-04 NOTE — Consult Note (Signed)
AOKI SAAH 1930-12-15  [id number].    Requesting MD: Julian Reil Chief Complaint/Reason for Consult: SB-SB Intussusception  HPI:  89 y/o F w/ a hx of NSCLC s/p lobectomy in April of 2024 and C.Diff who presented to the ED w/ multiple day of diarrhea and is now being treated for recurrent C.Diff.  She was recently treated with Augmentin for presumed PNA and reports that she then developed frequent loose stools approximately 4 days ago, prompting her to present to the ED.  CT 16.  CT performed and shows diffuse colitis as well as a small area of SB-SB intussusception w/o evidence of obstruction.  Workup c/w recurrent C.Diff.  On exam, patient resting comfortably. NAD.  She denies nausea, emesis, or bloating. Reports remote history of hysterectomy.   ROS: Review of Systems  Constitutional: Negative.   HENT: Negative.    Eyes: Negative.   Respiratory: Negative.    Cardiovascular: Negative.   Gastrointestinal:  Positive for diarrhea.  Genitourinary: Negative.   Musculoskeletal: Negative.   Skin: Negative.   Neurological: Negative.   Endo/Heme/Allergies: Negative.   Psychiatric/Behavioral: Negative.      Family History  Problem Relation Age of Onset   Heart disease Father    Cancer Sister        lung, kidney   Cancer Brother        brain   Cancer Paternal Aunt        breast   Breast cancer Paternal Aunt    Cancer Brother        liver, lung   Osteoporosis Neg Hx     Past Medical History:  Diagnosis Date   Acute postoperative pain 01/21/2023   Adenocarcinoma of lower lobe of right lung (HCC) 01/30/2023   Atrial fibrillation (HCC) 01/30/2023   Autoimmune disease (HCC)    Back pain 05/04/2020   Barrett's esophagus    Basal cell carcinoma (BCC) in situ of skin 04/04/2020   Path 03/2020,  Derm    Bone lesion    sacrum   Breast cancer (HCC) 2010   cT1 N0 M0; ER+/ PR+/ HER2 neg; s/p lumpectomy 07/2010; started adjuvant hormonal therapy 10/2009   C. difficile  diarrhea    Cervical cancer (HCC)    Chronic midline thoracic back pain 12/31/2020   Dizziness 02/22/2019   Dysphagia    Esophageal spasm 09/20/2013   Essential hypertension 03/21/2019   Fracture, shoulder 03/06/2012   Pt seen at [facility] by Dr. Bradly Bienenstock on 03/09/2012 and 03/11/2012.   X-rays taken of left wrist, left elbow, and left shoulder.   Shoulder Fx: nondisplaced proximal humerus fracture involving the greater tuberosity. Wrist: consistent with an old distal radius fracture with shortening present.       Glaucoma    Hoarseness of voice 02/28/2016   Humerus fracture 03/2012   impacted left humueral neck fracture, treated by Dr. Amanda Pea   Hx of meningioma of the brain 08/28/2017   Stable since 2010- 2018 on MRI   Hydropneumothorax 01/30/2023   Hypercalcemia    Hyperlipidemia    Hypothyroidism    Intracranial hemorrhage (HCC)    Laryngopharyngeal reflux 03/19/2016   Orbital fracture, closed, initial encounter (HCC) 04/29/2020   Osteoporosis 03/18/2012   Pancolitis (HCC) 01/30/2023   Paroxysmal SVT (supraventricular tachycardia) (HCC) 12/23/2020   Primary cancer of right lower lobe of lung (HCC) 01/20/2023   PVC (premature ventricular contraction) 09/08/2017   Raynaud's phenomenon 02/28/2016   Rib fractures 11/21/2022   Right lower lobe  lung mass 11/21/2022   SAH (subarachnoid hemorrhage) (HCC) 04/29/2020   Squamous cell carcinoma in situ of skin 11/30/2018   Path 11/1008, Dermatology managing    Stage 3a chronic kidney disease    Status post partial lobectomy of lung 01/20/2023   lower right lobe   Systolic murmur 02/23/2019   TBI (traumatic brain injury) (HCC) 05/04/2020    Past Surgical History:  Procedure Laterality Date   ABDOMINAL HYSTERECTOMY  1972   cancer          1 tube 1 ovary   APPENDECTOMY     BACK SURGERY  11/28/2020   BREAST LUMPECTOMY Left 2010   colon polyp removal     EYE SURGERY     HAND SURGERY     KYPHOPLASTY N/A 11/28/2020    Procedure: KYPHOPLASTY THORACIC TWELVE AND LUMBAR ONE;  Surgeon: Venita Lick, MD;  Location: [facility] OR;  Service: Orthopedics;  Laterality: N/A;  90 mins   LOBECTOMY Right 01/20/2023   right lower lobe   ortho procedures     multiple   OVARIAN CYST REMOVAL  1978   SHOULDER SURGERY  1954   left    Social History:  reports that she quit smoking about 36 years ago. Her smoking use included cigarettes. She has never used smokeless tobacco. She reports current alcohol use. She reports that she does not use drugs.  Allergies:  Allergies  Allergen Reactions   Daypro [Oxaprozin] Swelling    Face and tongue   Prolia [Denosumab]     unknown    Medications Prior to Admission  Medication Sig Dispense Refill   acetaminophen (TYLENOL) 500 MG tablet Take 2 tablets (1,000 mg total) by mouth 3 (three) times daily as needed for mild pain.     amLODipine (NORVASC) 2.5 MG tablet Take 1 tablet (2.5 mg total) by mouth daily. 90 tablet 3   amoxicillin-clavulanate (AUGMENTIN) 875-125 MG tablet Take 1 tablet by mouth 2 (two) times daily. 20 tablet 0   Calcium Carbonate-Vitamin D (CALCIUM + D PO) Take 1 capsule by mouth daily.      cloNIDine (CATAPRES) 0.1 MG tablet Take 1 tablet (0.1 mg total) by mouth daily. Use if needed for vocal tic 30 tablet 3   latanoprost (XALATAN) 0.005 % ophthalmic solution Place 1 drop into both eyes at bedtime.      levothyroxine (SYNTHROID) 75 MCG tablet TAKE 1 TABLET BY MOUTH DAILY BEFORE BREAKFAST 90 tablet 3   osimertinib mesylate (TAGRISSO) 40 MG tablet Take 40 mg by mouth every other day.     rosuvastatin (CRESTOR) 10 MG tablet Take 1 tablet (10 mg total) by mouth daily. TAKE 1 BY MOUTH DAILY 90 tablet 3    Physical Exam: Blood pressure 139/72, pulse 69, temperature 97.9 F (36.6 C), temperature source Oral, resp. rate 18, height 5\' 4"  (1.626 m), weight 47.6 kg, SpO2 99%. Gen: elderly female, NAD Abd: soft, non-distended, non TTP  Results for orders placed or  performed during the hospital encounter of 11/03/23 (from the past 48 hours)  Lipase, blood     Status: None   Collection Time: 11/03/23  4:17 PM  Result Value Ref Range   Lipase 26 11 - 51 U/L    Comment: Performed at [facility], [address] [place], [place] [address]  Comprehensive metabolic panel     Status: Abnormal   Collection Time: 11/03/23  4:17 PM  Result Value Ref Range   Sodium 134 (L) 135 - 145  mmol/L   Potassium 4.1 3.5 - 5.1 mmol/L   Chloride 100 98 - 111 mmol/L   CO2 22 22 - 32 mmol/L   Glucose, Bld 89 70 - 99 mg/dL    Comment: Glucose reference range applies only to samples taken after fasting for at least 8 hours.   BUN 21 8 - 23 mg/dL   Creatinine, Ser 4.09 0.44 - 1.00 mg/dL   Calcium 9.7 8.9 - 81.1 mg/dL   Total Protein 7.4 6.5 - 8.1 g/dL   Albumin 3.7 3.5 - 5.0 g/dL   AST 36 15 - 41 U/L   ALT 27 0 - 44 U/L   Alkaline Phosphatase 90 38 - 126 U/L   Total Bilirubin 0.7 0.0 - 1.2 mg/dL   GFR, Estimated 57 (L) >60 mL/min    Comment: (NOTE) Calculated using the CKD-EPI Creatinine Equation (2021)    Anion gap 12 5 - 15    Comment: Performed at [facility], [address] [place], [place] [address]  CBC     Status: Abnormal   Collection Time: 11/03/23  4:17 PM  Result Value Ref Range   WBC 16.5 (H) 4.0 - 10.5 K/uL    Comment: WHITE COUNT CONFIRMED ON SMEAR   RBC 3.71 (L) 3.87 - 5.11 MIL/uL   Hemoglobin 11.0 (L) 12.0 - 15.0 g/dL   HCT 29.5 (L) 62.1 - 30.8 %   MCV 92.5 80.0 - 100.0 fL   MCH 29.6 26.0 - 34.0 pg   MCHC 32.1 30.0 - 36.0 g/dL   RDW 65.7 84.6 - 96.2 %   Platelets 167 150 - 400 K/uL    Comment: SPECIMEN CHECKED FOR CLOTS REPEATED TO VERIFY    nRBC 0.0 0.0 - 0.2 %    Comment: Performed at [facility], [address] [place], [place] [address]  Magnesium     Status: None   Collection Time: 11/03/23  4:17 PM  Result Value Ref Range   Magnesium 1.8 1.7 - 2.4 mg/dL    Comment: Performed at [facility], [address] [place], [place] [address]  Resp panel by RT-PCR (RSV, Flu A&B, Covid) Anterior Nasal Swab     Status: None   Collection Time: 11/03/23  4:33 PM   Specimen: Anterior Nasal Swab  Result Value Ref Range   SARS Coronavirus 2 by RT PCR NEGATIVE NEGATIVE    Comment: (NOTE) SARS-CoV-2 target nucleic acids are NOT DETECTED.  The SARS-CoV-2 RNA is generally detectable in upper respiratory specimens during the acute phase of infection. The lowest concentration of SARS-CoV-2 viral copies this assay can detect is 138 copies/mL. A negative result does not preclude SARS-Cov-2 infection and should not be used as the sole basis for treatment or other patient management decisions. A negative result may occur with  improper specimen collection/handling, submission of specimen other than nasopharyngeal swab, presence of viral mutation(s) within the areas targeted by this assay, and inadequate number of viral copies(<138 copies/mL). A negative result must be combined with clinical observations, patient history, and epidemiological information. The expected result is Negative.  Fact Sheet for Patients:  BloggerCourse.com  Fact Sheet for Healthcare Providers:  SeriousBroker.it  This test is no t yet approved or cleared by the [place] FDA and  has been authorized for detection and/or diagnosis of SARS-CoV-2 by FDA under an Emergency Use Authorization (EUA). This EUA will remain  in effect (meaning this test can be used) for the  duration of the COVID-19 declaration under Section 564(b)(1) of the Act, 21 U.S.C.section 360bbb-3(b)(1), unless the authorization is terminated  or revoked sooner.       Influenza A by PCR NEGATIVE NEGATIVE   Influenza B by PCR NEGATIVE NEGATIVE    Comment: (NOTE) The Xpert Xpress SARS-CoV-2/FLU/RSV plus assay is intended as an aid in the diagnosis of influenza from Nasopharyngeal swab  specimens and should not be used as a sole basis for treatment. Nasal washings and aspirates are unacceptable for Xpert Xpress SARS-CoV-2/FLU/RSV testing.  Fact Sheet for Patients: BloggerCourse.com  Fact Sheet for Healthcare Providers: SeriousBroker.it  This test is not yet approved or cleared by the [place] FDA and has been authorized for detection and/or diagnosis of SARS-CoV-2 by FDA under an Emergency Use Authorization (EUA). This EUA will remain in effect (meaning this test can be used) for the duration of the COVID-19 declaration under Section 564(b)(1) of the Act, 21 U.S.C. section 360bbb-3(b)(1), unless the authorization is terminated or revoked.     Resp Syncytial Virus by PCR NEGATIVE NEGATIVE    Comment: (NOTE) Fact Sheet for Patients: BloggerCourse.com  Fact Sheet for Healthcare Providers: SeriousBroker.it  This test is not yet approved or cleared by the [place] FDA and has been authorized for detection and/or diagnosis of SARS-CoV-2 by FDA under an Emergency Use Authorization (EUA). This EUA will remain in effect (meaning this test can be used) for the duration of the COVID-19 declaration under Section 564(b)(1) of the Act, 21 U.S.C. section 360bbb-3(b)(1), unless the authorization is terminated or revoked.  Performed at [facility], [address] [place], [place] [address]   C Difficile Quick Screen w PCR reflex     Status: Abnormal   Collection Time: 11/03/23  6:57 PM   Specimen: STOOL  Result Value Ref Range   C Diff antigen POSITIVE (A) NEGATIVE   C Diff toxin NEGATIVE NEGATIVE   C Diff interpretation Results are indeterminate. See PCR results.     Comment: Performed at [facility], [address], [place], [place] [address]  C. Diff by PCR, Reflexed     Status: Abnormal   Collection Time: 11/03/23  6:57 PM  Result Value  Ref Range   Toxigenic C. Difficile by PCR POSITIVE (A) NEGATIVE    Comment: Positive for toxigenic C. difficile with little to no toxin production. Only treat if clinical presentation suggests symptomatic illness. Performed at [facility], [address] [place], [place] [address]   Urinalysis, Routine w reflex microscopic -Urine, Clean Catch     Status: Abnormal   Collection Time: 11/03/23 10:58 PM  Result Value Ref Range   Color, Urine YELLOW YELLOW   APPearance CLEAR CLEAR   Specific Gravity, Urine 1.010 1.005 - 1.030   pH 6.0 5.0 - 8.0   Glucose, UA NEGATIVE NEGATIVE mg/dL   Hgb urine dipstick TRACE (A) NEGATIVE   Bilirubin Urine NEGATIVE NEGATIVE   Ketones, ur NEGATIVE NEGATIVE mg/dL   Protein, ur NEGATIVE NEGATIVE mg/dL   Nitrite NEGATIVE NEGATIVE   Leukocytes,Ua NEGATIVE NEGATIVE    Comment: Performed at [facility], [address] [place], [place] [address]  Urinalysis, Microscopic (reflex)     Status: Abnormal   Collection Time: 11/03/23 10:58 PM  Result Value Ref Range   RBC / HPF 0-5 0 - 5 RBC/hpf   WBC, UA NONE SEEN 0 - 5 WBC/hpf   Bacteria, UA RARE (A) NONE SEEN  Squamous Epithelial / HPF 0-5 0 - 5 /HPF    Comment: Performed at [facility], [address] [place], [place] [address]   CT ABDOMEN PELVIS W CONTRAST Result Date: 11/03/2023 CLINICAL DATA:  Diarrhea since Friday. Back pain. History of breast cancer and lung cancer status post lumpectomy. Hysterectomy. EXAM: CT ABDOMEN AND PELVIS WITH CONTRAST TECHNIQUE: Multidetector CT imaging of the abdomen and pelvis was performed using the standard protocol following bolus administration of intravenous contrast. RADIATION DOSE REDUCTION: This exam was performed according to the departmental dose-optimization program which includes automated exposure control, adjustment of the mA and/or kV according to patient size and/or use of iterative reconstruction technique. CONTRAST:  90mL OMNIPAQUE  IOHEXOL 300 MG/ML  SOLN COMPARISON:  CT 01/30/2023 FINDINGS: Lower chest: Increased large pericardial effusion. Similar small right pleural effusion. Chronic interstitial lung disease is unchanged. Hepatobiliary: No acute abnormality. Pancreas: Unremarkable. Spleen: Unremarkable. Adrenals/Urinary Tract: Stable adrenal glands and kidneys. No urinary calculi or hydronephrosis. Unremarkable bladder. Stomach/Bowel: Diffuse colonic wall thickening with mucosal hyperenhancement, slightly improved compared to 01/30/2023. Small bowel small bowel intussusception in the left abdomen (series 601/image 80). No evidence of lead point. No obstruction. Stomach is within normal limits. Vascular/Lymphatic: Aortic atherosclerosis. No enlarged abdominal or pelvic lymph nodes. Reproductive: Hysterectomy.  No adnexal mass. Other: No free intraperitoneal fluid or air. Musculoskeletal: New age-indeterminate mild compression of the superior endplate of L4 compared with 01/30/2023. No retropulsion. Chronic compression fractures of T11-L1. Kyphoplasty T12 and L1. IMPRESSION: 1. Diffuse colonic wall thickening with mucosal hyperenhancement, slightly improved compared to 01/30/2023. Findings are consistent with colitis. 2. New age-indeterminate mild compression of the superior endplate of L4 compared with 01/30/2023. No retropulsion. 3. Small bowel-small bowel intussusception in the left abdomen. This may be transient however short interval follow up CT is recommended to ensure resolution and exclude a neoplastic lead point. 4. Increased large pericardial effusion. 5. Similar small right pleural effusion. Aortic Atherosclerosis (ICD10-I70.0). Electronically Signed   By: Minerva Fester M.D.   On: 11/03/2023 20:46    Assessment/Plan 89 y/o F p/w recurrent C.Diff who has a CT showing colitis as well as a small segment of SB-SB intussusception   - She is not clinically obstructed and there is no proximal SB dilation on the CT. No  indications for surgical intervention at this time - Okay for clears. Advance as tolerated - No signs of complications from C.Diff requiring surgery at this time - Remainder of care per primary team  [facility] 11/04/2023, 5:04 AM Please see Amion for pager number during day hours 7:00am-4:30pm or 7:00am -11:30am on weekends

## 2023-11-04 NOTE — Assessment & Plan Note (Signed)
Med rec pending Plan to continue home BP meds

## 2023-11-05 DIAGNOSIS — A0472 Enterocolitis due to Clostridium difficile, not specified as recurrent: Secondary | ICD-10-CM | POA: Diagnosis not present

## 2023-11-05 LAB — CBC WITH DIFFERENTIAL/PLATELET
Abs Immature Granulocytes: 0.03 10*3/uL (ref 0.00–0.07)
Basophils Absolute: 0 10*3/uL (ref 0.0–0.1)
Basophils Relative: 1 %
Eosinophils Absolute: 0.2 10*3/uL (ref 0.0–0.5)
Eosinophils Relative: 3 %
HCT: 35.9 % — ABNORMAL LOW (ref 36.0–46.0)
Hemoglobin: 11.5 g/dL — ABNORMAL LOW (ref 12.0–15.0)
Immature Granulocytes: 0 %
Lymphocytes Relative: 14 %
Lymphs Abs: 1.2 10*3/uL (ref 0.7–4.0)
MCH: 29.7 pg (ref 26.0–34.0)
MCHC: 32 g/dL (ref 30.0–36.0)
MCV: 92.8 fL (ref 80.0–100.0)
Monocytes Absolute: 0.5 10*3/uL (ref 0.1–1.0)
Monocytes Relative: 6 %
Neutro Abs: 6.6 10*3/uL (ref 1.7–7.7)
Neutrophils Relative %: 76 %
Platelets: 206 10*3/uL (ref 150–400)
RBC: 3.87 MIL/uL (ref 3.87–5.11)
RDW: 15.2 % (ref 11.5–15.5)
WBC: 8.6 10*3/uL (ref 4.0–10.5)
nRBC: 0 % (ref 0.0–0.2)

## 2023-11-05 LAB — BASIC METABOLIC PANEL
Anion gap: 10 (ref 5–15)
BUN: 15 mg/dL (ref 8–23)
CO2: 24 mmol/L (ref 22–32)
Calcium: 9.5 mg/dL (ref 8.9–10.3)
Chloride: 105 mmol/L (ref 98–111)
Creatinine, Ser: 1.06 mg/dL — ABNORMAL HIGH (ref 0.44–1.00)
GFR, Estimated: 50 mL/min — ABNORMAL LOW (ref >60)
Glucose, Bld: 97 mg/dL (ref 70–99)
Potassium: 4.2 mmol/L (ref 3.5–5.1)
Sodium: 139 mmol/L (ref 135–145)

## 2023-11-05 LAB — MAGNESIUM: Magnesium: 2 mg/dL (ref 1.7–2.4)

## 2023-11-05 MED ORDER — CHOLESTYRAMINE LIGHT 4 G PO PACK
2.0000 g | PACK | Freq: Once | ORAL | Status: AC
  Administered 2023-11-05: 2 g via ORAL
  Filled 2023-11-05: qty 1

## 2023-11-05 MED ORDER — GLUCERNA SHAKE PO LIQD
237.0000 mL | Freq: Three times a day (TID) | ORAL | Status: DC
  Administered 2023-11-05 – 2023-11-06 (×4): 237 mL via ORAL

## 2023-11-05 NOTE — Plan of Care (Signed)

## 2023-11-05 NOTE — Progress Notes (Signed)
Subjective: Feels well today with no abdominal pain.  Tolerating some solid food with no n/v.  Still with some diarrhea.  ROS: See above, otherwise other systems negative  Objective: Vital signs in last 24 hours: Temp:  [97.2 F (36.2 C)-98.4 F (36.9 C)] 97.3 F (36.3 C) (01/23 0757) Pulse Rate:  [65-86] 67 (01/23 0757) Resp:  [17-18] 18 (01/23 0757) BP: (122-162)/(63-98) 142/77 (01/23 0757) SpO2:  [98 %-100 %] 100 % (01/23 0757) Last BM Date : 11/04/23  Intake/Output from previous day: No intake/output data recorded. Intake/Output this shift: No intake/output data recorded.  PE: Gen: NAD Abd: soft, NT, ND, +BS  Lab Results:  Recent Labs    11/03/23 1617 11/04/23 0554  WBC 16.5* 8.6  HGB 11.0* 10.0*  HCT 34.3* 31.1*  PLT 167 161   BMET Recent Labs    11/03/23 1617 11/04/23 0554  NA 134* 132*  K 4.1 3.9  CL 100 103  CO2 22 18*  GLUCOSE 89 67*  BUN 21 14  CREATININE 0.95 0.91  CALCIUM 9.7 8.3*   PT/INR No results for input(s): "LABPROT", "INR" in the last 72 hours. CMP     Component Value Date/Time   NA 132 (L) 11/04/2023 0554   NA 140 03/18/2013 0900   K 3.9 11/04/2023 0554   K 4.4 03/18/2013 0900   CL 103 11/04/2023 0554   CL 105 03/18/2013 0900   CO2 18 (L) 11/04/2023 0554   CO2 25 03/18/2013 0900   GLUCOSE 67 (L) 11/04/2023 0554   GLUCOSE 95 03/18/2013 0900   BUN 14 11/04/2023 0554   BUN 20.2 03/18/2013 0900   CREATININE 0.91 11/04/2023 0554   CREATININE 1.03 (H) 07/26/2020 1155   CREATININE 1.0 03/18/2013 0900   CALCIUM 8.3 (L) 11/04/2023 0554   CALCIUM 9.2 02/23/2019 0746   CALCIUM 9.6 03/18/2013 0900   PROT 7.4 11/03/2023 1617   PROT 6.7 03/18/2013 0900   ALBUMIN 3.7 11/03/2023 1617   ALBUMIN 3.3 (L) 03/18/2013 0900   AST 36 11/03/2023 1617   AST 21 03/18/2013 0900   ALT 27 11/03/2023 1617   ALT 12 03/18/2013 0900   ALKPHOS 90 11/03/2023 1617   ALKPHOS 80 03/18/2013 0900   BILITOT 0.7 11/03/2023 1617   BILITOT 0.36  03/18/2013 0900   GFRNONAA 60 (L) 11/04/2023 0554   GFRNONAA 51 (L) 02/28/2016 0853   GFRAA >60 05/08/2020 1000   GFRAA 58 (L) 02/28/2016 0853   Lipase     Component Value Date/Time   LIPASE 26 11/03/2023 1617       Studies/Results: ECHOCARDIOGRAM COMPLETE Result Date: 11/04/2023    ECHOCARDIOGRAM REPORT   Patient Name:   [name] Date of Exam: 11/04/2023 Medical Rec #:  [id number]     Height:       64.0 in Accession #:    [id number]    Weight:       105.0 lb Date of Birth:  1931-08-03     BSA:          1.488 m Patient Age:    89 years      BP:           130/81 mmHg Patient Gender: F             HR:           90 bpm. Exam Location:  Inpatient Procedure: 2D Echo, Color Doppler and Cardiac Doppler  Results communicated to Dr Allena Katz at 1446 on  11/04/23. Indications:    Pericardial Effusion  History:        Patient has prior history of Echocardiogram examinations, most                 recent 07/13/2023. Arrythmias:Atrial Fibrillation and PVC; Risk                 Factors:Hypertension and Lung Cancer.  Sonographer:    Amy Chionchio Referring Phys: [id number] JARED M GARDNER IMPRESSIONS  1. Large pericardial effusion. Measures 2.5cm adjacent to LV posterior wall. No evidence of tamponade. There is significant MV inflow respiratory variation, but difficult to interpret in setting of frequent PVCs. No RA/RV collapse seen. IVC is small though does not collapse with inspiration.  2. Left ventricular ejection fraction, by estimation, is 60 to 65%. The left ventricle has normal function. The left ventricle has no regional wall motion abnormalities. There is mild left ventricular hypertrophy. Left ventricular diastolic parameters are indeterminate.  3. Right ventricular systolic function is normal. The right ventricular size is normal. There is mildly elevated pulmonary artery systolic pressure. The estimated right ventricular systolic pressure is 39.8 mmHg.  4. The mitral valve is normal in structure. No evidence of  mitral valve regurgitation. No evidence of mitral stenosis.  5. The aortic valve is tricuspid. Aortic valve regurgitation is not visualized. No aortic stenosis is present.  6. The inferior vena cava is normal in size with <50% respiratory variability, suggesting right atrial pressure of 8 mmHg. FINDINGS  Left Ventricle: Left ventricular ejection fraction, by estimation, is 60 to 65%. The left ventricle has normal function. The left ventricle has no regional wall motion abnormalities. The left ventricular internal cavity size was small. There is mild left ventricular hypertrophy. Left ventricular diastolic parameters are indeterminate. Right Ventricle: The right ventricular size is normal. No increase in right ventricular wall thickness. Right ventricular systolic function is normal. There is mildly elevated pulmonary artery systolic pressure. The tricuspid regurgitant velocity is 2.82  m/s, and with an assumed right atrial pressure of 8 mmHg, the estimated right ventricular systolic pressure is 39.8 mmHg. Left Atrium: Left atrial size was normal in size. Right Atrium: Right atrial size was normal in size. Pericardium: A large pericardial effusion is present. Mitral Valve: The mitral valve is normal in structure. No evidence of mitral valve regurgitation. No evidence of mitral valve stenosis. MV peak gradient, 4.7 mmHg. The mean mitral valve gradient is 2.0 mmHg. Tricuspid Valve: The tricuspid valve is normal in structure. Tricuspid valve regurgitation is trivial. Aortic Valve: The aortic valve is tricuspid. Aortic valve regurgitation is not visualized. No aortic stenosis is present. Aortic valve mean gradient measures 6.0 mmHg. Aortic valve peak gradient measures 9.1 mmHg. Aortic valve area, by VTI measures 1.58 cm. Pulmonic Valve: The pulmonic valve was not well visualized. Pulmonic valve regurgitation is trivial. Aorta: The aortic root is normal in size and structure. Venous: The inferior vena cava is normal in  size with less than 50% respiratory variability, suggesting right atrial pressure of 8 mmHg. IAS/Shunts: The interatrial septum was not well visualized.  LEFT VENTRICLE PLAX 2D LVIDd:         3.30 cm     Diastology LVIDs:         2.60 cm     LV e' medial:    7.40 cm/s LV PW:         1.00 cm     LV E/e' medial:  10.3 LV IVS:  1.10 cm     LV e' lateral:   6.31 cm/s LVOT diam:     1.90 cm     LV E/e' lateral: 12.1 LV SV:         48 LV SV Index:   32 LVOT Area:     2.84 cm  LV Volumes (MOD) LV vol d, MOD A2C: 44.8 ml LV vol d, MOD A4C: 50.8 ml LV vol s, MOD A2C: 10.2 ml LV vol s, MOD A4C: 13.8 ml LV SV MOD A2C:     34.6 ml LV SV MOD A4C:     50.8 ml LV SV MOD BP:      36.7 ml RIGHT VENTRICLE             IVC RV Basal diam:  2.60 cm     IVC diam: 1.90 cm RV S prime:     19.60 cm/s TAPSE (M-mode): 1.4 cm LEFT ATRIUM             Index        RIGHT ATRIUM           Index LA Vol (A2C):   30.8 ml 20.70 ml/m  RA Area:     13.80 cm LA Vol (A4C):   25.9 ml 17.41 ml/m  RA Volume:   33.40 ml  22.45 ml/m LA Biplane Vol: 28.7 ml 19.29 ml/m  AORTIC VALVE                     PULMONIC VALVE AV Area (Vmax):    1.77 cm      PV Vmax:       0.77 m/s AV Area (Vmean):   1.72 cm      PV Peak grad:  2.4 mmHg AV Area (VTI):     1.58 cm AV Vmax:           151.00 cm/s AV Vmean:          114.000 cm/s AV VTI:            0.301 m AV Peak Grad:      9.1 mmHg AV Mean Grad:      6.0 mmHg LVOT Vmax:         94.00 cm/s LVOT Vmean:        69.300 cm/s LVOT VTI:          0.168 m LVOT/AV VTI ratio: 0.56  AORTA Ao Root diam: 2.50 cm MITRAL VALVE               TRICUSPID VALVE MV Area (PHT): 2.62 cm    TR Peak grad:   31.8 mmHg MV Area VTI:   1.76 cm    TR Vmax:        282.00 cm/s MV Peak grad:  4.7 mmHg MV Mean grad:  2.0 mmHg    SHUNTS MV Vmax:       1.08 m/s    Systemic VTI:  0.17 m MV Vmean:      57.1 cm/s   Systemic Diam: 1.90 cm MV Decel Time: 289 msec MV E velocity: 76.30 cm/s MV A velocity: 79.30 cm/s MV E/A ratio:  0.96 Epifanio Lesches MD Electronically signed by Epifanio Lesches MD Signature Date/Time: 11/04/2023/2:47:20 PM    Final    CT ABDOMEN PELVIS W CONTRAST Result Date: 11/03/2023 CLINICAL DATA:  Diarrhea since Friday. Back pain. History of breast cancer and lung cancer status post lumpectomy. Hysterectomy. EXAM: CT ABDOMEN AND PELVIS WITH CONTRAST  TECHNIQUE: Multidetector CT imaging of the abdomen and pelvis was performed using the standard protocol following bolus administration of intravenous contrast. RADIATION DOSE REDUCTION: This exam was performed according to the departmental dose-optimization program which includes automated exposure control, adjustment of the mA and/or kV according to patient size and/or use of iterative reconstruction technique. CONTRAST:  90mL OMNIPAQUE IOHEXOL 300 MG/ML  SOLN COMPARISON:  CT 01/30/2023 FINDINGS: Lower chest: Increased large pericardial effusion. Similar small right pleural effusion. Chronic interstitial lung disease is unchanged. Hepatobiliary: No acute abnormality. Pancreas: Unremarkable. Spleen: Unremarkable. Adrenals/Urinary Tract: Stable adrenal glands and kidneys. No urinary calculi or hydronephrosis. Unremarkable bladder. Stomach/Bowel: Diffuse colonic wall thickening with mucosal hyperenhancement, slightly improved compared to 01/30/2023. Small bowel small bowel intussusception in the left abdomen (series 601/image 80). No evidence of lead point. No obstruction. Stomach is within normal limits. Vascular/Lymphatic: Aortic atherosclerosis. No enlarged abdominal or pelvic lymph nodes. Reproductive: Hysterectomy.  No adnexal mass. Other: No free intraperitoneal fluid or air. Musculoskeletal: New age-indeterminate mild compression of the superior endplate of L4 compared with 01/30/2023. No retropulsion. Chronic compression fractures of T11-L1. Kyphoplasty T12 and L1. IMPRESSION: 1. Diffuse colonic wall thickening with mucosal hyperenhancement, slightly improved compared to  01/30/2023. Findings are consistent with colitis. 2. New age-indeterminate mild compression of the superior endplate of L4 compared with 01/30/2023. No retropulsion. 3. Small bowel-small bowel intussusception in the left abdomen. This may be transient however short interval follow up CT is recommended to ensure resolution and exclude a neoplastic lead point. 4. Increased large pericardial effusion. 5. Similar small right pleural effusion. Aortic Atherosclerosis (ICD10-I70.0). Electronically Signed   By: Minerva Fester M.D.   On: 11/03/2023 20:46    Anti-infectives: Anti-infectives (From admission, onward)    Start     Dose/Rate Route Frequency Ordered Stop   11/04/23 0315  fidaxomicin (DIFICID) tablet 200 mg        200 mg Oral 2 times daily 11/04/23 0225 11/13/23 0959   11/03/23 2200  vancomycin (VANCOCIN) capsule 125 mg  Status:  Discontinued        125 mg Oral 4 times daily 11/03/23 2107 11/03/23 2124   11/03/23 2200  fidaxomicin (DIFICID) tablet 200 mg  Status:  Discontinued        200 mg Oral 2 times daily 11/03/23 2125 11/04/23 0225        Assessment/Plan SB-SB intussusception, incidental find -no evidence of obstruction -bowels working well -tolerating a soft diet -no surgical plans -defer to medical service for her C diff -we will sign off at this time. -relayed to primary service.   FEN - soft VTE - per medicine ID - Dificid for C diff  C diff colitis  I reviewed hospitalist notes, last 24 h vitals and pain scores, last 48 h intake and output, last 24 h labs and trends, and last 24 h imaging results.   LOS: 1 day    Letha Cape , [facility] Surgery 11/05/2023, 8:32 AM Please see Amion for pager number during day hours 7:00am-4:30pm or 7:00am -11:30am on weekends

## 2023-11-05 NOTE — Care Management (Signed)
Transition of Care [facility]) - Inpatient Brief Assessment   Patient Details  Name: [name] MRN: [id number] Date of Birth: Apr 08, 1931  Transition of Care [facility] CM/SW Contact:    Lockie Pares, RN Phone Number: 11/05/2023, 2:20 PM   Clinical Narrative:  89 year old patient presented with loose stools, hx of c-diff, had been taking Augmentin for pneumonia. Positive for c-diff colitis.  No needs identified. The patient will be discussed in progressive daily rounds. If a need is identified, please place a TOC consult  Transition of Care Asessment: Insurance and Status: Insurance coverage has been reviewed Patient has primary care physician: Yes Home environment has been reviewed: Lives with spouse Prior level of function:: Independent Prior/Current Home Services: No current home services Social Drivers of Health Review: SDOH reviewed no interventions necessary Readmission risk has been reviewed: Yes Transition of care needs: no transition of care needs at this time

## 2023-11-05 NOTE — Progress Notes (Signed)
[facility] Progress Note Patient: [name] WUJ:[id number] DOB: 1930-11-17 DOA: 11/03/2023  DOS: the patient was seen and examined on 11/05/2023  Brief Hospital Course: PMH of NSCLC SP lobectomy in April 2024 now on immunotherapy, chronic known malignant pericardial and pleural effusion, C. difficile colitis, HTN, HLD, pulmonary fibrosis.  Was given antibiotics recently outpatient for 10 days. Now presented with C. difficile colitis.  Also found to have incidental pericardial effusion.  Cardiology was consulted. General surgery was consulted for small bowel intussusception.  Assessment and Plan: C. difficile colitis and intussusception. General Surgery was consulted for intussusception.  Conservative measures were recommended. Currently signed off. Still having diarrhea.  Tolerating soft diet.  Will change Ensure to Glucerna.  1 dose of cholestyramine.  Pericardial effusion. Looks like she was referred to cardiology but has not seen anyone. Echocardiogram showed large posterior pericardial effusion. Recommendation was for a consultation. Cardiology consult appreciated. Outpatient follow-up recommended. Heart rate normal.  Blood pressure normal. Holding Norvasc right now. On clonidine at home currently on hold. Monitor.  Hypothyroidism. Continue Synthroid for now  Adenocarcinoma.  Right lung Holding chemotherapy.  Concern for pneumonia. Went to see PCP on 1/8. Was started on Augmentin for 10 days 875. Likely resulted in C. difficile.  Mild hyponatremia. Monitor.  Leukocytosis. Resolved.  Anemia likely dilutional. Baseline around 11. Currently 10.  Monitor.  Metabolic acidosis. Monitor    Subjective: No nausea no vomiting no fever no chills.  No chest pain.  Reports 6 BM.  No blood in the stool.  Physical Exam: General: in Mild distress, No Rash Cardiovascular: S1 and S2 Present, No Murmur Respiratory: Good respiratory effort, Bilateral Air entry present.  No Crackles, No wheezes Abdomen: Bowel Sound present, No tenderness Extremities: No edema Neuro: Alert and oriented x3, no new focal deficit  Data Reviewed: I have Reviewed nursing notes, Vitals, and Lab results. Since last encounter, pertinent lab results CBC and BMP   . I have ordered test including CBC and BMP  .   Disposition: Status is: Inpatient Remains inpatient appropriate because: Monitor for improvement in diarrhea  enoxaparin (LOVENOX) injection 30 mg Start: 11/04/23 1000   Family Communication: Discussed with daughter on 1/22 Level of care: Telemetry Medical   Vitals:   11/05/23 0420 11/05/23 0757 11/05/23 1321 11/05/23 1559  BP: 122/63 (!) 142/77 (!) 142/75 (!) 142/81  Pulse: 65 67 68 70  Resp:  18 18 17   Temp: (!) 97.2 F (36.2 C) (!) 97.3 F (36.3 C) 97.8 F (36.6 C) 97.9 F (36.6 C)  TempSrc: Oral Oral Oral Oral  SpO2: 98% 100% 100% 99%  Weight:      Height:         Author: Lynden Oxford, MD 11/05/2023 6:22 PM  Please look on [email or web address] to find out who is on call.

## 2023-11-06 ENCOUNTER — Other Ambulatory Visit (HOSPITAL_COMMUNITY): Payer: Self-pay

## 2023-11-06 DIAGNOSIS — A0472 Enterocolitis due to Clostridium difficile, not specified as recurrent: Secondary | ICD-10-CM | POA: Diagnosis not present

## 2023-11-06 LAB — CBC
HCT: 34.6 % — ABNORMAL LOW (ref 36.0–46.0)
Hemoglobin: 11.1 g/dL — ABNORMAL LOW (ref 12.0–15.0)
MCH: 29.9 pg (ref 26.0–34.0)
MCHC: 32.1 g/dL (ref 30.0–36.0)
MCV: 93.3 fL (ref 80.0–100.0)
Platelets: 197 10*3/uL (ref 150–400)
RBC: 3.71 MIL/uL — ABNORMAL LOW (ref 3.87–5.11)
RDW: 15.1 % (ref 11.5–15.5)
WBC: 5.5 10*3/uL (ref 4.0–10.5)
nRBC: 0 % (ref 0.0–0.2)

## 2023-11-06 LAB — BASIC METABOLIC PANEL
Anion gap: 9 (ref 5–15)
BUN: 17 mg/dL (ref 8–23)
CO2: 23 mmol/L (ref 22–32)
Calcium: 9.3 mg/dL (ref 8.9–10.3)
Chloride: 109 mmol/L (ref 98–111)
Creatinine, Ser: 1.11 mg/dL — ABNORMAL HIGH (ref 0.44–1.00)
GFR, Estimated: 47 mL/min — ABNORMAL LOW (ref >60)
Glucose, Bld: 92 mg/dL (ref 70–99)
Potassium: 4 mmol/L (ref 3.5–5.1)
Sodium: 141 mmol/L (ref 135–145)

## 2023-11-06 LAB — MAGNESIUM: Magnesium: 1.9 mg/dL (ref 1.7–2.4)

## 2023-11-06 MED ORDER — FIDAXOMICIN 200 MG PO TABS
200.0000 mg | ORAL_TABLET | Freq: Two times a day (BID) | ORAL | 0 refills | Status: AC
  Filled 2023-11-06: qty 14, 7d supply, fill #0

## 2023-11-06 NOTE — Evaluation (Addendum)
Physical Therapy Evaluation Patient Details Name: [name] MRN: [id number] DOB: 1931/02/16 Today's Date: 11/06/2023  History of Present Illness  89 y.o. female presents to [facility] hospital on 11/03/2023 with multiple days of diarrhea. Pt admitted for management of Cdiff along with small bowel intussusception. PMH includes NSCLC s/p lobectomy in April 2024, HTN, HLD, TBI.  Clinical Impression  Pt presents to PT at or near her baseline. Pt is able to ambulate for community distances with good endurance. Pt demonstrates one lateral loss of balance which she correct with stepping strategy however no further incidences of instability are noted. Pt is encouraged to ambulate frequently during this admission in an effort to maintain her high level of mobility and function. Pt has no further acute PT needs. PT signing off.        If plan is discharge home, recommend the following:     Can travel by private vehicle        Equipment Recommendations None recommended by PT  Recommendations for Other Services       Functional Status Assessment Patient has had a recent decline in their functional status and demonstrates the ability to make significant improvements in function in a reasonable and predictable amount of time.     Precautions / Restrictions Precautions Precautions: Other (comment) Precaution Comments: enteric precautions Restrictions Weight Bearing Restrictions Per Provider Order: No      Mobility  Bed Mobility Overal bed mobility: Independent                  Transfers Overall transfer level: Independent Equipment used: None                    Ambulation/Gait Ambulation/Gait assistance: Modified independent (Device/Increase time) Gait Distance (Feet): 1500 Feet Assistive device: 1 person hand held assist (PRN hand hold from daughter) Gait Pattern/deviations: WFL(Within Functional Limits) Gait velocity: functional Gait velocity interpretation: >4.37  ft/sec, indicative of normal walking speed   General Gait Details: brisk step-through gait, one lateral loss of balance when quickly turning head to right, no further instability noted during session  Stairs            Wheelchair Mobility     Tilt Bed    Modified Rankin (Stroke Patients Only)       Balance Overall balance assessment: Independent                                           Pertinent Vitals/Pain Pain Assessment Pain Assessment: No/denies pain    Home Living Family/patient expects to be discharged to:: Private residence Living Arrangements: Spouse/significant other Available Help at Discharge: Family;Available 24 hours/day Type of Home: House Home Access: Stairs to enter Entrance Stairs-Rails: Right Entrance Stairs-Number of Steps: 5   Home Layout: One level Home Equipment: Agricultural consultant (2 wheels);Shower seat;Grab bars - tub/shower;Cane - single point      Prior Function Prior Level of Function : Independent/Modified Independent                     Extremity/Trunk Assessment   Upper Extremity Assessment Upper Extremity Assessment: Overall WFL for tasks assessed    Lower Extremity Assessment Lower Extremity Assessment: Overall WFL for tasks assessed    Cervical / Trunk Assessment Cervical / Trunk Assessment: Normal  Communication   Communication Communication: No apparent difficulties Cueing Techniques: Verbal cues  Cognition Arousal: Alert Behavior During Therapy: WFL for tasks assessed/performed Overall Cognitive Status: Within Functional Limits for tasks assessed                                          General Comments General comments (skin integrity, edema, etc.): VSS on RA, HR in 110s at end of ambulation    Exercises     Assessment/Plan    PT Assessment Patient does not need any further PT services  PT Problem List         PT Treatment Interventions      PT Goals (Current  goals can be found in the Care Plan section)       Frequency       Co-evaluation               AM-PAC PT "6 Clicks" Mobility  Outcome Measure Help needed turning from your back to your side while in a flat bed without using bedrails?: None Help needed moving from lying on your back to sitting on the side of a flat bed without using bedrails?: None Help needed moving to and from a bed to a chair (including a wheelchair)?: None Help needed standing up from a chair using your arms (e.g., wheelchair or bedside chair)?: None Help needed to walk in hospital room?: None Help needed climbing 3-5 steps with a railing? : None 6 Click Score: 24    End of Session   Activity Tolerance: Patient tolerated treatment well Patient left: in bed;with call bell/phone within reach;with family/visitor present Nurse Communication: Mobility status PT Visit Diagnosis: Other abnormalities of gait and mobility (R26.89)    Time: 1112-1140 PT Time Calculation (min) (ACUTE ONLY): 28 min   Charges:   PT Evaluation $PT Eval Low Complexity: 1 Low   PT General Charges $$ ACUTE PT VISIT: 1 Visit         Arlyss Gandy, PT, DPT Acute Rehabilitation Office [phone]   Arlyss Gandy 11/06/2023, 12:50 PM

## 2023-11-06 NOTE — Progress Notes (Signed)
Mobility Specialist: Progress Note   11/06/23 1159  Mobility  Activity Ambulated independently in hallway  Level of Assistance Independent  Assistive Device None  Distance Ambulated (ft) 500 ft  Activity Response Tolerated well  Mobility Referral Yes  Mobility visit 1 Mobility  Mobility Specialist Start Time (ACUTE ONLY) 1006  Mobility Specialist Stop Time (ACUTE ONLY) 1023  Mobility Specialist Time Calculation (min) (ACUTE ONLY) 17 min    Pt was eager for mobility session - received in bed. No complaints. Fast and steady ambulation throughout. Returned to room without fault. Left on EOB with all needs met, call bell in reach.   Maurene Capes Mobility Specialist Please contact via SecureChat or Rehab office at [phone]

## 2023-11-06 NOTE — Plan of Care (Signed)

## 2023-11-06 NOTE — Plan of Care (Signed)

## 2023-11-07 NOTE — Discharge Summary (Signed)
Physician Discharge Summary   Patient: [name] MRN: [id number] DOB: 27-Aug-1931  Admit date:     11/03/2023  Discharge date: 11/06/2023  Discharge Physician: Lynden Oxford  PCP: Pearline Cables, MD  Recommendations at discharge: Follow-up with PCP in 1 week. Follow-up with cardiology as recommended. Follow-up with oncology as scheduled.   Follow-up Information     Copland, Lori Found, MD. Schedule an appointment as soon as possible for a visit in 1 week(s).   Specialty: Family Medicine Why: with BMP lab to look at kidney and electrolytes Contact information: [address] [place] [address] [phone]         cardiology. Go to.   Why: the Appointment As Scheduled               Discharge Diagnoses: Principal Problem:   Colitis due to Clostridium difficile Active Problems:   Lung cancer [facility])   Essential hypertension   Atrial fibrillation (HCC)   Malignant pericardial effusion  Hospital Course: PMH of NSCLC SP lobectomy in April 2024 now on immunotherapy, chronic known malignant pericardial and pleural effusion, C. difficile colitis, HTN, HLD, pulmonary fibrosis.  Was given antibiotics recently outpatient for 10 days. Now presented with C. difficile colitis.  Also Mccoy to have incidental pericardial effusion.  Cardiology was consulted. General surgery was consulted for small bowel intussusception.  Assessment and Plan: C. difficile colitis and intussusception. General Surgery was consulted for intussusception.  Conservative measures were recommended. Currently signed off. Diarrhea resolved.  Able to tolerate oral diet.  No nausea no vomiting. Received 1 dose of cholestyramine.  Pericardial effusion. Looks like she was referred to cardiology but has not seen anyone. Echocardiogram showed large posterior pericardial effusion. Recommendation was for a consultation. Cardiology consult appreciated. Outpatient follow-up  recommended. Heart rate normal.  Blood pressure normal. Holding Norvasc right now. On clonidine at home currently on hold. Monitor.  Hypothyroidism. Continue Synthroid for now  Adenocarcinoma.  Right lung Holding chemotherapy.  Concern for pneumonia. Went to see PCP on 1/8. Was started on Augmentin for 10 days 875. Likely resulted in C. difficile.  Mild hyponatremia. Monitor.  Leukocytosis. Resolved.  Anemia likely dilutional. Baseline around 11. Currently 10.  Monitor.  Metabolic acidosis. Monitor   Consultants:  Cardiology  Procedures performed:  Echocardiogram   DISCHARGE MEDICATION: Allergies as of 11/06/2023       Reactions   Daypro [oxaprozin] Swelling   Face and tongue   Prolia [denosumab]    unknown        Medication List     PAUSE taking these medications    Tagrisso 40 MG tablet Wait to take this until: November 14, 2023 Generic drug: osimertinib mesylate Take 40 mg by mouth at bedtime.       STOP taking these medications    amLODipine 2.5 MG tablet Commonly known as: NORVASC   amoxicillin-clavulanate 875-125 MG tablet Commonly known as: AUGMENTIN   cloNIDine 0.1 MG tablet Commonly known as: CATAPRES       TAKE these medications    acetaminophen 500 MG tablet Commonly known as: TYLENOL Take 2 tablets (1,000 mg total) by mouth 3 (three) times daily as needed for mild pain. What changed:  when to take this reasons to take this   CALCIUM + D PO Take 1 capsule by mouth daily.   Dificid 200 MG Tabs tablet Generic drug: fidaxomicin Take 1 tablet (200 mg total) by mouth 2 (two) times daily for 7 days.  latanoprost 0.005 % ophthalmic solution Commonly known as: XALATAN Place 1 drop into both eyes at bedtime.   levothyroxine 75 MCG tablet Commonly known as: SYNTHROID TAKE 1 TABLET BY MOUTH DAILY BEFORE BREAKFAST   rosuvastatin 10 MG tablet Commonly known as: Crestor Take 1 tablet (10 mg total) by mouth daily. TAKE 1  BY MOUTH DAILY What changed: additional instructions       Disposition: Home Diet recommendation: Regular diet  Discharge Exam: Vitals:   11/06/23 0513 11/06/23 0750 11/06/23 0758 11/06/23 1555  BP: (!) 157/84 136/82  (!) 152/74  Pulse: 64 (!) 44 64 64  Resp: 18 18  18   Temp: 98.2 F (36.8 C) 97.6 F (36.4 C)  97.9 F (36.6 C)  TempSrc:  Oral  Oral  SpO2: 98% 96%  100%  Weight:      Height:       General: Appear in no distress; no visible Abnormal Neck Mass Or lumps, Conjunctiva normal Cardiovascular: S1 and S2 Present, no Murmur, Respiratory: good respiratory effort, Bilateral Air entry present and CTA, no Crackles, no wheezes Abdomen: Bowel Sound present, Non tender  Extremities: no Pedal edema Neurology: alert and oriented to time, place, and person  Filed Weights   11/03/23 1548  Weight: 47.6 kg   Condition at discharge: stable  The results of significant diagnostics from this hospitalization (including imaging, microbiology, ancillary and laboratory) are listed below for reference.   Imaging Studies: ECHOCARDIOGRAM COMPLETE Result Date: 11/04/2023    ECHOCARDIOGRAM REPORT   Patient Name:   [name] Date of Exam: 11/04/2023 Medical Rec #:  [id number]     Height:       64.0 in Accession #:    [id number]    Weight:       105.0 lb Date of Birth:  [birth year]     BSA:          1.488 m Patient Age:    89 years      BP:           130/81 mmHg Patient Gender: F             HR:           90 bpm. Exam Location:  Inpatient Procedure: 2D Echo, Color Doppler and Cardiac Doppler  Results communicated to Dr Allena Katz at 1446 on 11/04/23. Indications:    Pericardial Effusion  History:        Patient has prior history of Echocardiogram examinations, most                 recent 07/13/2023. Arrythmias:Atrial Fibrillation and PVC; Risk                 Factors:Hypertension and Lung Cancer.  Sonographer:    Amy Chionchio Referring Phys: [id number] JARED M GARDNER IMPRESSIONS  1. Large pericardial  effusion. Measures 2.5cm adjacent to LV posterior wall. No evidence of tamponade. There is significant MV inflow respiratory variation, but difficult to interpret in setting of frequent PVCs. No RA/RV collapse seen. IVC is small though does not collapse with inspiration.  2. Left ventricular ejection fraction, by estimation, is 60 to 65%. The left ventricle has normal function. The left ventricle has no regional wall motion abnormalities. There is mild left ventricular hypertrophy. Left ventricular diastolic parameters are indeterminate.  3. Right ventricular systolic function is normal. The right ventricular size is normal. There is mildly elevated pulmonary artery systolic pressure. The estimated right ventricular systolic pressure is 39.8 mmHg.  4. The mitral valve is normal in structure. No evidence of mitral valve regurgitation. No evidence of mitral stenosis.  5. The aortic valve is tricuspid. Aortic valve regurgitation is not visualized. No aortic stenosis is present.  6. The inferior vena cava is normal in size with <50% respiratory variability, suggesting right atrial pressure of 8 mmHg. FINDINGS  Left Ventricle: Left ventricular ejection fraction, by estimation, is 60 to 65%. The left ventricle has normal function. The left ventricle has no regional wall motion abnormalities. The left ventricular internal cavity size was small. There is mild left ventricular hypertrophy. Left ventricular diastolic parameters are indeterminate. Right Ventricle: The right ventricular size is normal. No increase in right ventricular wall thickness. Right ventricular systolic function is normal. There is mildly elevated pulmonary artery systolic pressure. The tricuspid regurgitant velocity is 2.82  m/s, and with an assumed right atrial pressure of 8 mmHg, the estimated right ventricular systolic pressure is 39.8 mmHg. Left Atrium: Left atrial size was normal in size. Right Atrium: Right atrial size was normal in size.  Pericardium: A large pericardial effusion is present. Mitral Valve: The mitral valve is normal in structure. No evidence of mitral valve regurgitation. No evidence of mitral valve stenosis. MV peak gradient, 4.7 mmHg. The mean mitral valve gradient is 2.0 mmHg. Tricuspid Valve: The tricuspid valve is normal in structure. Tricuspid valve regurgitation is trivial. Aortic Valve: The aortic valve is tricuspid. Aortic valve regurgitation is not visualized. No aortic stenosis is present. Aortic valve mean gradient measures 6.0 mmHg. Aortic valve peak gradient measures 9.1 mmHg. Aortic valve area, by VTI measures 1.58 cm. Pulmonic Valve: The pulmonic valve was not well visualized. Pulmonic valve regurgitation is trivial. Aorta: The aortic root is normal in size and structure. Venous: The inferior vena cava is normal in size with less than 50% respiratory variability, suggesting right atrial pressure of 8 mmHg. IAS/Shunts: The interatrial septum was not well visualized.  LEFT VENTRICLE PLAX 2D LVIDd:         3.30 cm     Diastology LVIDs:         2.60 cm     LV e' medial:    7.40 cm/s LV PW:         1.00 cm     LV E/e' medial:  10.3 LV IVS:        1.10 cm     LV e' lateral:   6.31 cm/s LVOT diam:     1.90 cm     LV E/e' lateral: 12.1 LV SV:         48 LV SV Index:   32 LVOT Area:     2.84 cm  LV Volumes (MOD) LV vol d, MOD A2C: 44.8 ml LV vol d, MOD A4C: 50.8 ml LV vol s, MOD A2C: 10.2 ml LV vol s, MOD A4C: 13.8 ml LV SV MOD A2C:     34.6 ml LV SV MOD A4C:     50.8 ml LV SV MOD BP:      36.7 ml RIGHT VENTRICLE             IVC RV Basal diam:  2.60 cm     IVC diam: 1.90 cm RV S prime:     19.60 cm/s TAPSE (M-mode): 1.4 cm LEFT ATRIUM             Index        RIGHT ATRIUM           Index LA  Vol (A2C):   30.8 ml 20.70 ml/m  RA Area:     13.80 cm LA Vol (A4C):   25.9 ml 17.41 ml/m  RA Volume:   33.40 ml  22.45 ml/m LA Biplane Vol: 28.7 ml 19.29 ml/m  AORTIC VALVE                     PULMONIC VALVE AV Area (Vmax):    1.77  cm      PV Vmax:       0.77 m/s AV Area (Vmean):   1.72 cm      PV Peak grad:  2.4 mmHg AV Area (VTI):     1.58 cm AV Vmax:           151.00 cm/s AV Vmean:          114.000 cm/s AV VTI:            0.301 m AV Peak Grad:      9.1 mmHg AV Mean Grad:      6.0 mmHg LVOT Vmax:         94.00 cm/s LVOT Vmean:        69.300 cm/s LVOT VTI:          0.168 m LVOT/AV VTI ratio: 0.56  AORTA Ao Root diam: 2.50 cm MITRAL VALVE               TRICUSPID VALVE MV Area (PHT): 2.62 cm    TR Peak grad:   31.8 mmHg MV Area VTI:   1.76 cm    TR Vmax:        282.00 cm/s MV Peak grad:  4.7 mmHg MV Mean grad:  2.0 mmHg    SHUNTS MV Vmax:       1.08 m/s    Systemic VTI:  0.17 m MV Vmean:      57.1 cm/s   Systemic Diam: 1.90 cm MV Decel Time: 289 msec MV E velocity: 76.30 cm/s MV A velocity: 79.30 cm/s MV E/A ratio:  0.96 Epifanio Lesches MD Electronically signed by Epifanio Lesches MD Signature Date/Time: 11/04/2023/2:47:20 PM    Final    CT ABDOMEN PELVIS W CONTRAST Result Date: 11/03/2023 CLINICAL DATA:  Diarrhea since Friday. Back pain. History of breast cancer and lung cancer status post lumpectomy. Hysterectomy. EXAM: CT ABDOMEN AND PELVIS WITH CONTRAST TECHNIQUE: Multidetector CT imaging of the abdomen and pelvis was performed using the standard protocol following bolus administration of intravenous contrast. RADIATION DOSE REDUCTION: This exam was performed according to the departmental dose-optimization program which includes automated exposure control, adjustment of the mA and/or kV according to patient size and/or use of iterative reconstruction technique. CONTRAST:  90mL OMNIPAQUE IOHEXOL 300 MG/ML  SOLN COMPARISON:  CT 01/30/2023 FINDINGS: Lower chest: Increased large pericardial effusion. Similar small right pleural effusion. Chronic interstitial lung disease is unchanged. Hepatobiliary: No acute abnormality. Pancreas: Unremarkable. Spleen: Unremarkable. Adrenals/Urinary Tract: Stable adrenal glands and kidneys. No  urinary calculi or hydronephrosis. Unremarkable bladder. Stomach/Bowel: Diffuse colonic wall thickening with mucosal hyperenhancement, slightly improved compared to 01/30/2023. Small bowel small bowel intussusception in the left abdomen (series 601/image 80). No evidence of lead point. No obstruction. Stomach is within normal limits. Vascular/Lymphatic: Aortic atherosclerosis. No enlarged abdominal or pelvic lymph nodes. Reproductive: Hysterectomy.  No adnexal mass. Other: No free intraperitoneal fluid or air. Musculoskeletal: New age-indeterminate mild compression of the superior endplate of L4 compared with 01/30/2023. No retropulsion. Chronic compression fractures of T11-L1. Kyphoplasty T12 and L1. IMPRESSION:  1. Diffuse colonic wall thickening with mucosal hyperenhancement, slightly improved compared to 01/30/2023. Findings are consistent with colitis. 2. New age-indeterminate mild compression of the superior endplate of L4 compared with 01/30/2023. No retropulsion. 3. Small bowel-small bowel intussusception in the left abdomen. This may be transient however short interval follow up CT is recommended to ensure resolution and exclude a neoplastic lead point. 4. Increased large pericardial effusion. 5. Similar small right pleural effusion. Aortic Atherosclerosis (ICD10-I70.0). Electronically Signed   By: Minerva Fester M.D.   On: 11/03/2023 20:46   DG Chest 2 View Result Date: 10/21/2023 CLINICAL DATA:  Pleural and pericardial disease. EXAM: CHEST - 2 VIEW COMPARISON:  January 31, 2023. FINDINGS: Stable cardiomegaly. Minimal left basilar subsegmental atelectasis or scarring is noted. Mildly increased right basilar opacity is noted concerning for worsening atelectasis or possibly infiltrate. Probable scarring is noted laterally in right upper lobe. Thoracic kyphosis is noted. Status post kyphoplasty of 2 lower thoracic vertebral bodies. IMPRESSION: Mildly increased right basilar opacity is noted concerning for  worsening atelectasis or possibly infiltrate with possible small pleural effusion or pleural thickening. Electronically Signed   By: Lupita Raider M.D.   On: 10/21/2023 10:45    Microbiology: Results for orders placed or performed during the hospital encounter of 11/03/23  Resp panel by RT-PCR (RSV, Flu A&B, Covid) Anterior Nasal Swab     Status: None   Collection Time: 11/03/23  4:33 PM   Specimen: Anterior Nasal Swab  Result Value Ref Range Status   SARS Coronavirus 2 by RT PCR NEGATIVE NEGATIVE Final    Comment: (NOTE) SARS-CoV-2 target nucleic acids are NOT DETECTED.  The SARS-CoV-2 RNA is generally detectable in upper respiratory specimens during the acute phase of infection. The lowest concentration of SARS-CoV-2 viral copies this assay can detect is 138 copies/mL. A negative result does not preclude SARS-Cov-2 infection and should not be used as the sole basis for treatment or other patient management decisions. A negative result may occur with  improper specimen collection/handling, submission of specimen other than nasopharyngeal swab, presence of viral mutation(s) within the areas targeted by this assay, and inadequate number of viral copies(<138 copies/mL). A negative result must be combined with clinical observations, patient history, and epidemiological information. The expected result is Negative.  Fact Sheet for Patients:  BloggerCourse.com  Fact Sheet for Healthcare Providers:  SeriousBroker.it  This test is no t yet approved or cleared by the [place] FDA and  has been authorized for detection and/or diagnosis of SARS-CoV-2 by FDA under an Emergency Use Authorization (EUA). This EUA will remain  in effect (meaning this test can be used) for the duration of the COVID-19 declaration under Section 564(b)(1) of the Act, 21 U.S.C.section 360bbb-3(b)(1), unless the authorization is terminated  or revoked sooner.        Influenza A by PCR NEGATIVE NEGATIVE Final   Influenza B by PCR NEGATIVE NEGATIVE Final    Comment: (NOTE) The Xpert Xpress SARS-CoV-2/FLU/RSV plus assay is intended as an aid in the diagnosis of influenza from Nasopharyngeal swab specimens and should not be used as a sole basis for treatment. Nasal washings and aspirates are unacceptable for Xpert Xpress SARS-CoV-2/FLU/RSV testing.  Fact Sheet for Patients: BloggerCourse.com  Fact Sheet for Healthcare Providers: SeriousBroker.it  This test is not yet approved or cleared by the [place] FDA and has been authorized for detection and/or diagnosis of SARS-CoV-2 by FDA under an Emergency Use Authorization (EUA). This EUA will remain in effect (meaning  this test can be used) for the duration of the COVID-19 declaration under Section 564(b)(1) of the Act, 21 U.S.C. section 360bbb-3(b)(1), unless the authorization is terminated or revoked.     Resp Syncytial Virus by PCR NEGATIVE NEGATIVE Final    Comment: (NOTE) Fact Sheet for Patients: BloggerCourse.com  Fact Sheet for Healthcare Providers: SeriousBroker.it  This test is not yet approved or cleared by the [place] FDA and has been authorized for detection and/or diagnosis of SARS-CoV-2 by FDA under an Emergency Use Authorization (EUA). This EUA will remain in effect (meaning this test can be used) for the duration of the COVID-19 declaration under Section 564(b)(1) of the Act, 21 U.S.C. section 360bbb-3(b)(1), unless the authorization is terminated or revoked.  Performed at [facility], [address] [place], [place] [address]   C Difficile Quick Screen w PCR reflex     Status: Abnormal   Collection Time: 11/03/23  6:57 PM   Specimen: STOOL  Result Value Ref Range Status   C Diff antigen POSITIVE (A) NEGATIVE Final   C Diff toxin NEGATIVE  NEGATIVE Final   C Diff interpretation Results are indeterminate. See PCR results.  Final    Comment: Performed at [facility], [address], [place], [place] [address]  C. Diff by PCR, Reflexed     Status: Abnormal   Collection Time: 11/03/23  6:57 PM  Result Value Ref Range Status   Toxigenic C. Difficile by PCR POSITIVE (A) NEGATIVE Final    Comment: Positive for toxigenic C. difficile with little to no toxin production. Only treat if clinical presentation suggests symptomatic illness. Performed at [facility], [address] [place], [place] [address]    Labs: CBC: Recent Labs  Lab 11/03/23 1617 11/04/23 0554 11/05/23 0931 11/06/23 0652  WBC 16.5* 8.6 8.6 5.5  NEUTROABS  --   --  6.6  --   HGB 11.0* 10.0* 11.5* 11.1*  HCT 34.3* 31.1* 35.9* 34.6*  MCV 92.5 94.0 92.8 93.3  PLT 167 161 206 197   Basic Metabolic Panel: Recent Labs  Lab 11/03/23 1617 11/04/23 0554 11/05/23 0931 11/06/23 0652  NA 134* 132* 139 141  K 4.1 3.9 4.2 4.0  CL 100 103 105 109  CO2 22 18* 24 23  GLUCOSE 89 67* 97 92  BUN 21 14 15 17   CREATININE 0.95 0.91 1.06* 1.11*  CALCIUM 9.7 8.3* 9.5 9.3  MG 1.8  --  2.0 1.9   Liver Function Tests: Recent Labs  Lab 11/03/23 1617  AST 36  ALT 27  ALKPHOS 90  BILITOT 0.7  PROT 7.4  ALBUMIN 3.7   CBG: No results for input(s): "GLUCAP" in the last 168 hours.  Discharge time spent: greater than 30 minutes.  Author: Lynden Oxford, MD  [facility] 11/06/2023

## 2023-11-09 ENCOUNTER — Telehealth: Payer: Self-pay

## 2023-11-09 NOTE — Transitions of Care (Post Inpatient/ED Visit) (Signed)
11/09/2023  Name: [name] MRN: [id number] DOB: Jun 09, 1931  Today's TOC FU Call Status: Today's TOC FU Call Status:: Successful TOC FU Call Completed TOC FU Call Complete Date: 11/09/23 Patient's Name and Date of Birth confirmed.  Transition Care Management Follow-up Telephone Call Date of Discharge: 11/06/23 Discharge Facility: [facility]) Type of Discharge: Inpatient Admission Primary Inpatient Discharge Diagnosis:: Clostridium Difficile, Colitis How have you been since you were released from the hospital?: Better ("I feel so much better" Denies loose stools, abdominal pain, nausea/vomiting or cramping.) Any questions or concerns?: No  Items Reviewed: Did you receive and understand the discharge instructions provided?: Yes Medications obtained,verified, and reconciled?: Yes (Medications Reviewed) Any new allergies since your discharge?: No Dietary orders reviewed?: Yes Type of Diet Ordered:: Soft Do you have support at home?: Yes People in Home: child(ren), adult, spouse Name of Support/Comfort Primary Source: [name]  Medications Reviewed Today: Medications Reviewed Today     Reviewed by Wyline Mood, RN (Case Manager) on 11/09/23 at 1605  Med List Status: <None>   Medication Order Taking? Sig Documenting Provider Last Dose Status Informant  acetaminophen (TYLENOL) 500 MG tablet [id number] Yes Take 2 tablets (1,000 mg total) by mouth 3 (three) times daily as needed for mild pain.  Patient taking differently: Take 1,000 mg by mouth daily as needed for mild pain (pain score 1-3) or moderate pain (pain score 4-6).   [name], MD Taking Active Self, Pharmacy Records  Calcium Carbonate-Vitamin D (CALCIUM + D PO) [id number] Yes Take 1 capsule by mouth daily.  [provider] Taking Active Self, Pharmacy Records  fidaxomicin (DIFICID) 200 MG TABS tablet [id number] Yes Take 1 tablet (200 mg total) by mouth 2 (two) times daily for 7 days. [name],  MD Taking Active   latanoprost (XALATAN) 0.005 % ophthalmic solution [id number] Yes Place 1 drop into both eyes at bedtime.  [provider] Taking Active Self, Pharmacy Records  levothyroxine (SYNTHROID) 75 MCG tablet [id number] Yes TAKE 1 TABLET BY MOUTH DAILY BEFORE BREAKFAST Copland, Lori Found, MD Taking Active Self, Pharmacy Records  osimertinib mesylate (TAGRISSO) 40 MG tablet [id number] No Take 40 mg by mouth at bedtime.  Patient not taking: Reported on 11/09/2023   [provider] Not Taking Active Self, Pharmacy Records  rosuvastatin (CRESTOR) 10 MG tablet [id number] Yes Take 1 tablet (10 mg total) by mouth daily. TAKE 1 BY MOUTH DAILY  Patient taking differently: Take 10 mg by mouth daily.   Copland, Lori Found, MD Taking Active Self, Pharmacy Records            Home Care and Equipment/Supplies: Were Home Health Services Ordered?: No Any new equipment or medical supplies ordered?: No  Functional Questionnaire: Do you need assistance with bathing/showering or dressing?: No Do you need assistance with meal preparation?: No Do you need assistance with eating?: No Do you have difficulty maintaining continence: No Do you need assistance with getting out of bed/getting out of a chair/moving?: No Do you have difficulty managing or taking your medications?: No  Follow up appointments reviewed: PCP Follow-up appointment confirmed?: No (Patient states she will call Dr.Copland's office tomorrow to schedule her follow-up appointment.  Declined RNCM assistance to schedule appointment for her.) MD Provider Line [phone] Given: No Specialist Hospital Follow-up appointment confirmed?: Yes Date of Specialist follow-up appointment?: 11/10/23 Follow-Up Specialty Provider:: Cardiology, [name] Do you need transportation to your follow-up appointment?: No Do you understand care options if your condition(s) worsen?: Yes-patient  verbalized understanding  SDOH  Interventions Today    Flowsheet Row Most Recent Value  SDOH Interventions   Food Insecurity Interventions Intervention Not Indicated  Housing Interventions Intervention Not Indicated  Transportation Interventions Intervention Not Indicated  Utilities Interventions Intervention Not Indicated      Patient reports "I feel so much better" .  Denies loose stools, abdominal pain, nausea/vomiting or cramping.  Patient has specialist appointment with Cardiology- [name], scheduled for 11/10/23.  Patient states she will call her PCP, Dr. Patsy Mccoy, tomorrow and schedule her post-hospitalization follow up appointment.  Patient declined RNCM offer to assist with scheduling.  Provided verbal education on signs/symptoms to monitor for and when to notify her PCP if there are any questions or concerns.  Full medication review/reconciliation completed and patient able to verbalize the medications that have been paused, stopped, as well as, new medications. No acute,unmet TOC needs identified.  TOC program reviewed with patient and has declined enrollment in TOC 30 day program at this time.  RNCM contact information provided if needs should change after call today.  Juan Kissoon Daphine Deutscher BSN, Programmer, systems / Transitions of Care [place] [facility], [facility] Direct Dial Number:  [phone]

## 2023-11-10 DIAGNOSIS — R0609 Other forms of dyspnea: Secondary | ICD-10-CM | POA: Diagnosis not present

## 2023-11-10 DIAGNOSIS — I10 Essential (primary) hypertension: Secondary | ICD-10-CM | POA: Diagnosis not present

## 2023-11-10 DIAGNOSIS — I3139 Other pericardial effusion (noninflammatory): Secondary | ICD-10-CM | POA: Diagnosis not present

## 2023-11-10 DIAGNOSIS — C50119 Malignant neoplasm of central portion of unspecified female breast: Secondary | ICD-10-CM | POA: Diagnosis not present

## 2023-11-10 DIAGNOSIS — K561 Intussusception: Secondary | ICD-10-CM | POA: Diagnosis not present

## 2023-11-10 DIAGNOSIS — I314 Cardiac tamponade: Secondary | ICD-10-CM | POA: Diagnosis not present

## 2023-11-10 DIAGNOSIS — C3431 Malignant neoplasm of lower lobe, right bronchus or lung: Secondary | ICD-10-CM | POA: Diagnosis not present

## 2023-11-10 DIAGNOSIS — I480 Paroxysmal atrial fibrillation: Secondary | ICD-10-CM | POA: Diagnosis not present

## 2023-11-10 DIAGNOSIS — A0472 Enterocolitis due to Clostridium difficile, not specified as recurrent: Secondary | ICD-10-CM | POA: Diagnosis not present

## 2023-11-10 DIAGNOSIS — C3491 Malignant neoplasm of unspecified part of right bronchus or lung: Secondary | ICD-10-CM | POA: Diagnosis not present

## 2023-11-13 NOTE — Progress Notes (Unsigned)
[facility] [address], [address] [place], [place] [address] [phone]  Date:  11/16/2023   Name:  [name]   DOB:  1931-02-23   MRN:  [id number]  PCP:  Lori Cables, MD    Chief Complaint: No chief complaint on file.   History of Present Illness:  [name] is a 89 y.o. very pleasant female patient who presents with the following:  Patient seen today for hospital follow-up Most recent visit with myself was earlier this year, January 8 In 2024 she was diagnosed with lung cancer found incidentally on imaging study for rib injury-she underwent lobectomy April 2024, recovery complicated by C. difficile Also, history of hypertension, hyperlipidemia, hypothyroidism, breast cancer, more recently some difficulty with paranoia and anxiety    Oncology care is at [facility] Her cardiologist is Dr. Tomie China who has been following her for pericardial effusion  At her visit earlier this month [name] felt she was getting short of breath more easily-chest x-ray showed a possible worsening right basilar opacity, worsening atelectasis versus infiltrate.  [name] elected to start antibiotics and I prescribed Augmentin, unfortunately she then got C. difficile colitis as below  Admit date:     11/03/2023  Discharge date: 11/06/2023  Discharge Diagnoses: Principal Problem:   Colitis due to Clostridium difficile Active Problems:   Lung cancer [facility])   Essential hypertension   Atrial fibrillation (HCC)   Malignant pericardial effusion   Hospital Course: PMH of NSCLC SP lobectomy in April 2024 now on immunotherapy, chronic known malignant pericardial and pleural effusion, C. difficile colitis, HTN, HLD, pulmonary fibrosis.  Was given antibiotics recently outpatient for 10 days. Now presented with C. difficile colitis.  Also found to have incidental pericardial effusion.  Cardiology was consulted. General surgery was consulted for small  bowel intussusception.   Assessment and Plan: C. difficile colitis and intussusception. General Surgery was consulted for intussusception.  Conservative measures were recommended. Currently signed off. Diarrhea resolved.  Able to tolerate oral diet.  No nausea no vomiting. Received 1 dose of cholestyramine. Pericardial effusion. Looks like she was referred to cardiology but has not seen anyone. Echocardiogram showed large posterior pericardial effusion. Recommendation was for a consultation. Cardiology consult appreciated. Outpatient follow-up recommended. Heart rate normal.  Blood pressure normal. Holding Norvasc right now. On clonidine at home currently on hold. Monitor. Hypothyroidism. Continue Synthroid for now Adenocarcinoma.  Right lung Holding chemotherapy. Concern for pneumonia. Went to see PCP on 1/8. Was started on Augmentin for 10 days 875. Likely resulted in C. difficile. Mild hyponatremia. Monitor. Leukocytosis. Resolved. Anemia likely dilutional. Baseline around 11. Currently 10.  Monitor.   Cardiology consult note regarding pericardial effusion- Dr Eden Emms 11/04/2023: ASSESSMENT AND PLAN:  Pericardial effusion:  large only in posterior aspect. Given how long she has had it and lack of tamponade doubt it is malignant. Diagnostically could tap right pleural effusion to see if that is malignant. She has no cardiac symptoms, no echo signs of tamponade and the posterior effusion would require a pericardial window to tab. This is currently not indicated. TKI inhibitor Tigrasso is more associated with long QT and cardiomyopathy Her QT is normal and her EF is normal. Only 0.6% incidence of worsening pericardial effusion so I would continue this Rx Arrhythmia:  I see no evidence of PAF and she is not a candidate for anticoagulation. Atrial ectopy common with effusion no need for outpatient monitor ECG with normal QT  She also followed up with her cardiology team at [facility] on  11/10/2023- Dr Lupita Shutter Oncology visit 11/10/2023: 1) lung adenocarcinoma - I have discussed extensively with the patient and family regarding the presumed diagnosis of lung cancer, natural history, and anticipated treatment options. She is interested in additional diagnostic evaluation in order to determine treatment options. PETCT shows 4.8 cm hypermet lesion in the RLL stage II without clear nodal or distant disease . She has undergone right lower lobectomy yielding a 4.7 cm tumor with hilar nodal involvement pathologic T2b pN1 stage IIb adenocarcinoma. Genotyping shows EGFR exon 19 positivity. We discussed the rationale relative risks and benefits of postoperative adjuvant osimertinib.  Started Osimertinib 40 mg daily on 04/02/23. Intolerant with sever mucositis, reduced to 40 mg Q3 days w/o sig s/e. Gradually increased to 40 mg daily starting 06/23/23.  CT 08/31/23 stable overall, monitor small right subpleural nodules, moderate pericardial effusion. OSH CT ABD on 11/03/23 showed large pericardial effusion. Although pt had baseline small pericardial effusion, this seemed to have increased since TKI. Cont holding TKI. Seeing Card [name] later today. Repeat CT in 4 wks as previously planned.  Patient Active Problem List   Diagnosis Date Noted   Lung cancer (HCC) 11/04/2023   Colitis due to Clostridium difficile 11/03/2023   Coronary artery calcification 06/11/2023   Malignant pericardial effusion 06/11/2023   Pancolitis (HCC) 01/30/2023   Atrial fibrillation (HCC) 01/30/2023   Hydropneumothorax 01/30/2023   Adenocarcinoma of lower lobe of right lung (HCC) 01/30/2023   Acute postoperative pain 01/21/2023   Status post partial lobectomy of lung 01/20/2023   Primary cancer of right lower lobe of lung (HCC) 01/20/2023   Rib fractures 11/21/2022   Right lower lobe lung mass 11/21/2022   Autoimmune disease (HCC) 07/31/2021   Barrett's esophagus 07/31/2021   Bone lesion 07/31/2021   Cervical  cancer (HCC) 07/31/2021   Dysphagia 07/31/2021   Hypothyroidism 07/31/2021   Chronic midline thoracic back pain 12/31/2020   Paroxysmal SVT (supraventricular tachycardia) (HCC) 12/23/2020   Intracranial hemorrhage (HCC)    TBI (traumatic brain injury) (HCC) 05/04/2020   Back pain 05/04/2020   SAH (subarachnoid hemorrhage) (HCC) 04/29/2020   Orbital fracture, closed, initial encounter (HCC) 04/29/2020   Basal cell carcinoma (BCC) in situ of skin 04/04/2020   Essential hypertension 03/21/2019   Systolic murmur 02/23/2019   Hypercalcemia    Dizziness 02/22/2019   Squamous cell carcinoma in situ of skin 11/30/2018   PVC (premature ventricular contraction) 09/08/2017   Glaucoma 09/08/2017   Hx of meningioma of the brain 08/28/2017   Laryngopharyngeal reflux 03/19/2016   Hoarseness of voice 02/28/2016   Raynaud's phenomenon 02/28/2016   Esophageal spasm 09/20/2013   Osteoporosis 03/18/2012   Humerus fracture 03/2012   Fracture, shoulder 03/06/2012   Hyperlipidemia    Breast cancer (HCC)     Past Medical History:  Diagnosis Date   Acute postoperative pain 01/21/2023   Adenocarcinoma of lower lobe of right lung (HCC) 01/30/2023   Atrial fibrillation (HCC) 01/30/2023   Autoimmune disease (HCC)    Back pain 05/04/2020   Barrett's esophagus    Basal cell carcinoma (BCC) in situ of skin 04/04/2020   Path 03/2020, [facility]    Bone lesion    sacrum   Breast cancer (HCC) 2010   cT1 N0 M0; ER+/ PR+/ HER2 neg; s/p lumpectomy 07/2010; started adjuvant hormonal therapy 10/2009   C. difficile diarrhea    Cervical cancer (HCC)    Chronic midline thoracic back pain  12/31/2020   Dizziness 02/22/2019   Dysphagia    Esophageal spasm 09/20/2013   Essential hypertension 03/21/2019   Fracture, shoulder 03/06/2012   Pt seen at [facility] by Dr. Bradly Bienenstock on 03/09/2012 and 03/11/2012.   X-rays taken of left wrist, left elbow, and left shoulder.   Shoulder Fx: nondisplaced  proximal humerus fracture involving the greater tuberosity. Wrist: consistent with an old distal radius fracture with shortening present.       Glaucoma    Hoarseness of voice 02/28/2016   Humerus fracture 03/2012   impacted left humueral neck fracture, treated by Dr. Amanda Pea   Hx of meningioma of the brain 08/28/2017   Stable since 2010- 2018 on MRI   Hydropneumothorax 01/30/2023   Hypercalcemia    Hyperlipidemia    Hypothyroidism    Intracranial hemorrhage (HCC)    Laryngopharyngeal reflux 03/19/2016   Orbital fracture, closed, initial encounter (HCC) 04/29/2020   Osteoporosis 03/18/2012   Pancolitis (HCC) 01/30/2023   Paroxysmal SVT (supraventricular tachycardia) (HCC) 12/23/2020   Primary cancer of right lower lobe of lung (HCC) 01/20/2023   PVC (premature ventricular contraction) 09/08/2017   Raynaud's phenomenon 02/28/2016   Rib fractures 11/21/2022   Right lower lobe lung mass 11/21/2022   SAH (subarachnoid hemorrhage) (HCC) 04/29/2020   Squamous cell carcinoma in situ of skin 11/30/2018   Path 11/1008, Dermatology managing    Stage 3a chronic kidney disease    Status post partial lobectomy of lung 01/20/2023   lower right lobe   Systolic murmur 02/23/2019   TBI (traumatic brain injury) (HCC) 05/04/2020    Past Surgical History:  Procedure Laterality Date   ABDOMINAL HYSTERECTOMY  1972   cancer          1 tube 1 ovary   APPENDECTOMY     BACK SURGERY  11/28/2020   BREAST LUMPECTOMY Left 2010   colon polyp removal     EYE SURGERY     HAND SURGERY     KYPHOPLASTY N/A 11/28/2020   Procedure: KYPHOPLASTY THORACIC TWELVE AND LUMBAR ONE;  Surgeon: Venita Lick, MD;  Location: [facility] OR;  Service: Orthopedics;  Laterality: N/A;  90 mins   LOBECTOMY Right 01/20/2023   right lower lobe   ortho procedures     multiple   OVARIAN CYST REMOVAL  1978   SHOULDER SURGERY  1954   left    Social History   Tobacco Use   Smoking status: Former    Current packs/day: 0.00     Types: Cigarettes    Quit date: 12/16/1986    Years since quitting: 36.9   Smokeless tobacco: Never   Tobacco comments:    FORMER SMOKER 30 YEARS AGO  Vaping Use   Vaping status: Never Used  Substance Use Topics   Alcohol use: Yes    Alcohol/week: 0.0 standard drinks of alcohol    Comment: OCCASIONALLY   Drug use: No    Family History  Problem Relation Age of Onset   Heart disease Father    Cancer Sister        lung, kidney   Cancer Brother        brain   Cancer Paternal Aunt        breast   Breast cancer Paternal Aunt    Cancer Brother        liver, lung   Osteoporosis Neg Hx     Allergies  Allergen Reactions   Daypro [Oxaprozin] Swelling    Face and tongue  Prolia [Denosumab]     unknown    Medication list has been reviewed and updated.  Current Outpatient Medications on File Prior to Visit  Medication Sig Dispense Refill   acetaminophen (TYLENOL) 500 MG tablet Take 2 tablets (1,000 mg total) by mouth 3 (three) times daily as needed for mild pain. (Patient taking differently: Take 1,000 mg by mouth daily as needed for mild pain (pain score 1-3) or moderate pain (pain score 4-6).)     Calcium Carbonate-Vitamin D (CALCIUM + D PO) Take 1 capsule by mouth daily.      fidaxomicin (DIFICID) 200 MG TABS tablet Take 1 tablet (200 mg total) by mouth 2 (two) times daily for 7 days. 14 tablet 0   latanoprost (XALATAN) 0.005 % ophthalmic solution Place 1 drop into both eyes at bedtime.      levothyroxine (SYNTHROID) 75 MCG tablet TAKE 1 TABLET BY MOUTH DAILY BEFORE BREAKFAST 90 tablet 3   [Paused] osimertinib mesylate (TAGRISSO) 40 MG tablet Take 40 mg by mouth at bedtime. (Patient not taking: Reported on 11/09/2023)     rosuvastatin (CRESTOR) 10 MG tablet Take 1 tablet (10 mg total) by mouth daily. TAKE 1 BY MOUTH DAILY (Patient taking differently: Take 10 mg by mouth daily.) 90 tablet 3   No current facility-administered medications on file prior to visit.    Review of  Systems:  As per HPI- otherwise negative.   Physical Examination: There were no vitals filed for this visit. There were no vitals filed for this visit. There is no height or weight on file to calculate BMI. Ideal Body Weight:    GEN: no acute distress. HEENT: Atraumatic, Normocephalic.  Ears and Nose: No external deformity. CV: RRR, No M/G/R. No JVD. No thrill. No extra heart sounds. PULM: CTA B, no wheezes, crackles, rhonchi. No retractions. No resp. distress. No accessory muscle use. ABD: S, NT, ND, +BS. No rebound. No HSM. EXTR: No c/c/e PSYCH: Normally interactive. Conversant.    Assessment and Plan: ***  Signed Abbe Amsterdam, MD

## 2023-11-13 NOTE — Patient Instructions (Incomplete)
It was good to see you today, I am so glad you are feeling better  I will see you in April  We can re-start your BP meds in the meantime if your numbers are going up Recommend dose of RSV vaccine at your pharmacy Totally up to you if you wish to start back on the cholesterol medication

## 2023-11-16 ENCOUNTER — Ambulatory Visit (INDEPENDENT_AMBULATORY_CARE_PROVIDER_SITE_OTHER): Payer: Medicare Other | Admitting: Family Medicine

## 2023-11-16 ENCOUNTER — Other Ambulatory Visit (HOSPITAL_BASED_OUTPATIENT_CLINIC_OR_DEPARTMENT_OTHER): Payer: Self-pay

## 2023-11-16 VITALS — BP 134/78 | HR 65 | Temp 97.8°F | Resp 18 | Ht 64.0 in | Wt 104.4 lb

## 2023-11-16 DIAGNOSIS — A0472 Enterocolitis due to Clostridium difficile, not specified as recurrent: Secondary | ICD-10-CM

## 2023-11-16 DIAGNOSIS — Z09 Encounter for follow-up examination after completed treatment for conditions other than malignant neoplasm: Secondary | ICD-10-CM | POA: Diagnosis not present

## 2023-11-16 DIAGNOSIS — E039 Hypothyroidism, unspecified: Secondary | ICD-10-CM

## 2023-11-16 MED ORDER — AREXVY 120 MCG/0.5ML IM SUSR
0.5000 mL | Freq: Once | INTRAMUSCULAR | 0 refills | Status: AC
  Filled 2023-11-16: qty 0.5, 1d supply, fill #0

## 2023-11-16 MED ORDER — LEVOTHYROXINE SODIUM 75 MCG PO TABS: ORAL_TABLET | ORAL | 3 refills | Status: DC

## 2023-11-20 DIAGNOSIS — K080 Exfoliation of teeth due to systemic causes: Secondary | ICD-10-CM | POA: Diagnosis not present

## 2023-11-30 DIAGNOSIS — I3139 Other pericardial effusion (noninflammatory): Secondary | ICD-10-CM | POA: Diagnosis not present

## 2023-11-30 DIAGNOSIS — I071 Rheumatic tricuspid insufficiency: Secondary | ICD-10-CM | POA: Diagnosis not present

## 2023-11-30 NOTE — Progress Notes (Unsigned)
Pt here for Blood pressure check per Dr Patsy Lager 11/16/23 "She is holding her BP meds on the advice on the inpt team- do not need to restart as of yet BP check nurse visit in 2 weeks" Last BP was: 134/78 at Hospital F/U  Pt currently takes: No meds at this time  Pt reports compliance with medication.  BP today @ = 120/72 HR = 68  Pt advised that I would send her BP readings to Dr Patsy Lager, and she will let us know if any changes need to be made moving forward. Pt aware and voices understanding.   Note was routed to PCP.  DOD: Laury Axon

## 2023-12-01 ENCOUNTER — Ambulatory Visit (INDEPENDENT_AMBULATORY_CARE_PROVIDER_SITE_OTHER): Payer: Medicare Other

## 2023-12-01 VITALS — BP 120/72 | HR 68

## 2023-12-01 DIAGNOSIS — I10 Essential (primary) hypertension: Secondary | ICD-10-CM | POA: Diagnosis not present

## 2023-12-02 DIAGNOSIS — H401132 Primary open-angle glaucoma, bilateral, moderate stage: Secondary | ICD-10-CM | POA: Diagnosis not present

## 2023-12-03 NOTE — Progress Notes (Signed)
BP Readings from Last 3 Encounters:  12/01/23 120/72  11/16/23 134/78  11/06/23 (!) 152/74   She is still holding her amlodipine and clonidine- continue to hold these meds and she will see me in about 8 weeks

## 2023-12-07 DIAGNOSIS — J90 Pleural effusion, not elsewhere classified: Secondary | ICD-10-CM | POA: Diagnosis not present

## 2023-12-07 DIAGNOSIS — C3431 Malignant neoplasm of lower lobe, right bronchus or lung: Secondary | ICD-10-CM | POA: Diagnosis not present

## 2023-12-07 DIAGNOSIS — K2289 Other specified disease of esophagus: Secondary | ICD-10-CM | POA: Diagnosis not present

## 2023-12-07 DIAGNOSIS — C3491 Malignant neoplasm of unspecified part of right bronchus or lung: Secondary | ICD-10-CM | POA: Diagnosis not present

## 2023-12-07 DIAGNOSIS — K561 Intussusception: Secondary | ICD-10-CM | POA: Diagnosis not present

## 2023-12-07 DIAGNOSIS — Z79899 Other long term (current) drug therapy: Secondary | ICD-10-CM | POA: Diagnosis not present

## 2023-12-07 DIAGNOSIS — I3139 Other pericardial effusion (noninflammatory): Secondary | ICD-10-CM | POA: Diagnosis not present

## 2023-12-07 DIAGNOSIS — R911 Solitary pulmonary nodule: Secondary | ICD-10-CM | POA: Diagnosis not present

## 2023-12-09 DIAGNOSIS — K080 Exfoliation of teeth due to systemic causes: Secondary | ICD-10-CM | POA: Diagnosis not present

## 2023-12-16 DIAGNOSIS — I2510 Atherosclerotic heart disease of native coronary artery without angina pectoris: Secondary | ICD-10-CM | POA: Diagnosis not present

## 2023-12-16 DIAGNOSIS — C3431 Malignant neoplasm of lower lobe, right bronchus or lung: Secondary | ICD-10-CM | POA: Diagnosis not present

## 2023-12-16 DIAGNOSIS — J918 Pleural effusion in other conditions classified elsewhere: Secondary | ICD-10-CM | POA: Diagnosis not present

## 2023-12-16 DIAGNOSIS — I509 Heart failure, unspecified: Secondary | ICD-10-CM | POA: Diagnosis not present

## 2023-12-16 DIAGNOSIS — I110 Hypertensive heart disease with heart failure: Secondary | ICD-10-CM | POA: Diagnosis not present

## 2023-12-16 DIAGNOSIS — I10 Essential (primary) hypertension: Secondary | ICD-10-CM | POA: Diagnosis not present

## 2023-12-16 DIAGNOSIS — Z853 Personal history of malignant neoplasm of breast: Secondary | ICD-10-CM | POA: Diagnosis not present

## 2023-12-16 DIAGNOSIS — G4730 Sleep apnea, unspecified: Secondary | ICD-10-CM | POA: Diagnosis not present

## 2023-12-16 DIAGNOSIS — Z79899 Other long term (current) drug therapy: Secondary | ICD-10-CM | POA: Diagnosis not present

## 2023-12-16 DIAGNOSIS — J90 Pleural effusion, not elsewhere classified: Secondary | ICD-10-CM | POA: Diagnosis not present

## 2023-12-16 DIAGNOSIS — R0602 Shortness of breath: Secondary | ICD-10-CM | POA: Diagnosis not present

## 2023-12-21 ENCOUNTER — Telehealth: Payer: Self-pay

## 2023-12-21 NOTE — Transitions of Care (Post Inpatient/ED Visit) (Signed)
   12/21/2023  Name: [name] MRN: [id number] DOB: 1931-09-11  Today's TOC FU Call Status: Today's TOC FU Call Status:: Successful TOC FU Call Completed TOC FU Call Complete Date: 12/21/23 Patient's Name and Date of Birth confirmed.  Transition Care Management Follow-up Telephone Call Date of Discharge: 12/17/23 Discharge Facility: Other ([facility]) Name of Other ([facility]) Discharge Facility: [facility] Type of Discharge: Emergency Department Reason for ED Visit: Other: (hypertension) How have you been since you were released from the hospital?: Better Any questions or concerns?: No  Items Reviewed: Did you receive and understand the discharge instructions provided?: Yes Medications obtained,verified, and reconciled?: Yes (Medications Reviewed) Any new allergies since your discharge?: No Dietary orders reviewed?: Yes Do you have support at home?: Yes People in Home: spouse  Medications Reviewed Today: Medications Reviewed Today     Reviewed by Karena Addison, LPN (Licensed Practical Nurse) on 12/21/23 at 1608  Med List Status: <None>   Medication Order Taking? Sig Documenting Provider Last Dose Status Informant  acetaminophen (TYLENOL) 500 MG tablet [id number] Yes Take 2 tablets (1,000 mg total) by mouth 3 (three) times daily as needed for mild pain.  Patient taking differently: Take 1,000 mg by mouth daily as needed for mild pain (pain score 1-3) or moderate pain (pain score 4-6).   [name], MD Taking Active Self, Pharmacy Records  amLODipine [facility]) 2.5 MG tablet [id number] Yes Take 2.5 mg by mouth daily. [provider] Taking Active   Calcium Carbonate-Vitamin D (CALCIUM + D PO) [id number] Yes Take 1 capsule by mouth daily.  [provider] Taking Active Self, Pharmacy Records  latanoprost (XALATAN) 0.005 % ophthalmic solution [id number] Yes Place 1 drop into both eyes at bedtime.  [provider] Taking Active Self, Pharmacy Records   levothyroxine (SYNTHROID) 75 MCG tablet [id number] Yes TAKE 1 TABLET BY MOUTH DAILY BEFORE BREAKFAST Copland, [name], MD Taking Active   osimertinib mesylate (TAGRISSO) 40 MG tablet [id number] Yes Take 40 mg by mouth at bedtime. [provider] Taking Active Self, Pharmacy Records  rosuvastatin (CRESTOR) 10 MG tablet [id number] Yes Take 1 tablet (10 mg total) by mouth daily. TAKE 1 BY MOUTH DAILY Copland, [name], MD Taking Active Self, Pharmacy Records            Home Care and Equipment/Supplies: Were Home Health Services Ordered?: NA Any new equipment or medical supplies ordered?: NA  Functional Questionnaire: Do you need assistance with bathing/showering or dressing?: No Do you need assistance with meal preparation?: No Do you need assistance with eating?: No Do you have difficulty maintaining continence: No Do you need assistance with getting out of bed/getting out of a chair/moving?: No Do you have difficulty managing or taking your medications?: No  Follow up appointments reviewed: PCP Follow-up appointment confirmed?: Yes Date of PCP follow-up appointment?: 12/31/23 Follow-up Provider: [facility] Follow-up appointment confirmed?: NA Do you need transportation to your follow-up appointment?: No Do you understand care options if your condition(s) worsen?: Yes-patient verbalized understanding    SIGNATURE Karena Addison, LPN [facility] Nurse Health Advisor Direct Dial [phone]

## 2023-12-30 DIAGNOSIS — R1310 Dysphagia, unspecified: Secondary | ICD-10-CM | POA: Diagnosis not present

## 2023-12-30 NOTE — Patient Instructions (Incomplete)
 It was great to see you again today- you look great!   Please let me know if any concerns, otherwise let's visit in 3-4 months

## 2023-12-30 NOTE — Progress Notes (Unsigned)
 [facility] [address], [address] [place], [place] [address] [phone]  Date:  12/31/2023   Name:  [name]   DOB:  23-Oct-1930   MRN:  [id number]  PCP:  Pearline Cables, MD    Chief Complaint: No chief complaint on file.   History of Present Illness:  [name] is a 89 y.o. very pleasant female patient who presents with the following:  Patient seen today for follow-up visit.  Most recent visit with myself was in February after she was admitted with C. difficile secondary to antibiotic use for pneumonia  In 2024 she was diagnosed with lung cancer found incidentally on imaging study for rib injury-she underwent lobectomy April 2024, recovery complicated by C. difficile Also, history of hypertension, hyperlipidemia, hypothyroidism, breast cancer, more recently some difficulty with paranoia and anxiety   In the interim [name] was seen by oncology towards the end of February.  She had a thoracentesis for pleural chronic pleural effusion on March 5  Her oncology team is with [facility]: 12/07/2023 9:13 AM ONCOLOGY CARE TEAM: Med Onc - Veronia Beets, MD, Theresa Duty, NP TSU - Arbie Cookey, MD, Cecilio Asper, [place]  PATIENT PROFILE: [name] is a 89 y.o.  Who presents with a diagnosis of lung cancer.  ONCOLOGIC PROBLEM LIST:  1) lung adenocarcinoma - 11/21/22 p/w mech fall and acute chest wall pain with CT showing multiple nondisplaced fractures at OSH - 11/21/22 CT showing 1. Nondisplaced fractures of the anterolateral left 7th-9th ribs. No evidence of pneumothorax. 2. No acute traumatic findings in the abdomen or pelvis. Trace free fluid is seen in the right upper quadrant of the abdomen which is nonspecific given no evidence of solid organ injury. 3. Large irregular mass of the right lower lobe, increased in size when compared with prior CT and concerning for primary lung malignancy  - 12/10/22 CT guided biopsy yielding  adenocarcinoma TTF1+ - 12/26/22 PETCT showing Hypermetabolic right lower lobe mass measuring up to 4.8 cm consistent with biopsy-proven malignancy. No evidence of metastatic disease.Indeterminate mildly FDG avid groundglass opacity in the left lung apex, which may represent low grade lung malignancy. - 01/20/23 RLL lobectomy yielding pT2bpN1 adenocarcinoma; margins neg, 4.7 cm tumor; EGFR econ 19 deletion.  - 4/20-4/30 rehospitalized with Cdiff - 04/02/23 started osimertinib 80 mg daily. Held on 04/15/23 d/t rash, mucositis, eye irritation, weight loss.  - 04/29/23 resumed osi 40 mg daily - 05/05/24 held again d/t severe mucositis - 05/25/23 CT chest stable pulmonary fibrosis with peripheral reticulation suggestive UIP. Stable RUL subpleural nodule and LUL nodule.  - 05/13/23 started osi 40 mg q 3 days - 06/23/23 started osi 40 mg daily.  - 08/31/23 CT chest stable post right lower lobectomy. Stable LLL nodule. Stable right lung subpleural nodules, radiology commented on mild increase c/w febraury, monitor. Stable pericardial effusion c/w August, increase c/w February scan. Stable basilar fibrotic lung disease.  - 1/21 - 11/06/23 admission for c-diff colitis. CT a/p noted increase now large pericardial effusion, ECHO EF >55%, no tamponade. Osimertinib held. 2) breast cancer 2011   After her thoracentesis on March 5 she was seen in the ER with concern of high blood pressure-she was evaluated and released to home Patient Active Problem List   Diagnosis Date Noted   Lung cancer (HCC) 11/04/2023   Colitis due to Clostridium difficile 11/03/2023   Coronary artery calcification 06/11/2023   Malignant pericardial effusion 06/11/2023  Pancolitis (HCC) 01/30/2023   Atrial fibrillation (HCC) 01/30/2023   Hydropneumothorax 01/30/2023   Adenocarcinoma of lower lobe of right lung (HCC) 01/30/2023   Acute postoperative pain 01/21/2023   Status post partial lobectomy of lung 01/20/2023   Primary cancer of right  lower lobe of lung (HCC) 01/20/2023   Rib fractures 11/21/2022   Right lower lobe lung mass 11/21/2022   Autoimmune disease (HCC) 07/31/2021   Barrett's esophagus 07/31/2021   Bone lesion 07/31/2021   Cervical cancer (HCC) 07/31/2021   Dysphagia 07/31/2021   Hypothyroidism 07/31/2021   Chronic midline thoracic back pain 12/31/2020   Paroxysmal SVT (supraventricular tachycardia) (HCC) 12/23/2020   Intracranial hemorrhage (HCC)    TBI (traumatic brain injury) (HCC) 05/04/2020   Back pain 05/04/2020   SAH (subarachnoid hemorrhage) (HCC) 04/29/2020   Orbital fracture, closed, initial encounter (HCC) 04/29/2020   Basal cell carcinoma (BCC) in situ of skin 04/04/2020   Essential hypertension 03/21/2019   Systolic murmur 02/23/2019   Hypercalcemia    Dizziness 02/22/2019   Squamous cell carcinoma in situ of skin 11/30/2018   PVC (premature ventricular contraction) 09/08/2017   Glaucoma 09/08/2017   Hx of meningioma of the brain 08/28/2017   Laryngopharyngeal reflux 03/19/2016   Hoarseness of voice 02/28/2016   Raynaud's phenomenon 02/28/2016   Esophageal spasm 09/20/2013   Osteoporosis 03/18/2012   Humerus fracture 03/2012   Fracture, shoulder 03/06/2012   Hyperlipidemia    Breast cancer (HCC)     Past Medical History:  Diagnosis Date   Acute postoperative pain 01/21/2023   Adenocarcinoma of lower lobe of right lung (HCC) 01/30/2023   Atrial fibrillation (HCC) 01/30/2023   Autoimmune disease (HCC)    Back pain 05/04/2020   Barrett's esophagus    Basal cell carcinoma (BCC) in situ of skin 04/04/2020   Path 03/2020, [facility]    Bone lesion    sacrum   Breast cancer (HCC) 2010   cT1 N0 M0; ER+/ PR+/ HER2 neg; s/p lumpectomy 07/2010; started adjuvant hormonal therapy 10/2009   C. difficile diarrhea    Cervical cancer (HCC)    Chronic midline thoracic back pain 12/31/2020   Dizziness 02/22/2019   Dysphagia    Esophageal spasm 09/20/2013   Essential hypertension  03/21/2019   Fracture, shoulder 03/06/2012   Pt seen at [facility] by Dr. Bradly Bienenstock on 03/09/2012 and 03/11/2012.   X-rays taken of left wrist, left elbow, and left shoulder.   Shoulder Fx: nondisplaced proximal humerus fracture involving the greater tuberosity. Wrist: consistent with an old distal radius fracture with shortening present.       Glaucoma    Hoarseness of voice 02/28/2016   Humerus fracture 03/2012   impacted left humueral neck fracture, treated by Dr. Amanda Pea   Hx of meningioma of the brain 08/28/2017   Stable since 2010- 2018 on MRI   Hydropneumothorax 01/30/2023   Hypercalcemia    Hyperlipidemia    Hypothyroidism    Intracranial hemorrhage (HCC)    Laryngopharyngeal reflux 03/19/2016   Orbital fracture, closed, initial encounter (HCC) 04/29/2020   Osteoporosis 03/18/2012   Pancolitis (HCC) 01/30/2023   Paroxysmal SVT (supraventricular tachycardia) (HCC) 12/23/2020   Primary cancer of right lower lobe of lung (HCC) 01/20/2023   PVC (premature ventricular contraction) 09/08/2017   Raynaud's phenomenon 02/28/2016   Rib fractures 11/21/2022   Right lower lobe lung mass 11/21/2022   SAH (subarachnoid hemorrhage) (HCC) 04/29/2020   Squamous cell carcinoma in situ of skin 11/30/2018   Path 11/1008, Dermatology  managing    Stage 3a chronic kidney disease    Status post partial lobectomy of lung 01/20/2023   lower right lobe   Systolic murmur 02/23/2019   TBI (traumatic brain injury) (HCC) 05/04/2020    Past Surgical History:  Procedure Laterality Date   ABDOMINAL HYSTERECTOMY  1972   cancer          1 tube 1 ovary   APPENDECTOMY     BACK SURGERY  11/28/2020   BREAST LUMPECTOMY Left 2010   colon polyp removal     EYE SURGERY     HAND SURGERY     KYPHOPLASTY N/A 11/28/2020   Procedure: KYPHOPLASTY THORACIC TWELVE AND LUMBAR ONE;  Surgeon: Venita Lick, MD;  Location: [facility] OR;  Service: Orthopedics;  Laterality: N/A;  90 mins   LOBECTOMY Right  01/20/2023   right lower lobe   ortho procedures     multiple   OVARIAN CYST REMOVAL  1978   SHOULDER SURGERY  1954   left    Social History   Tobacco Use   Smoking status: Former    Current packs/day: 0.00    Types: Cigarettes    Quit date: 12/16/1986    Years since quitting: 37.0   Smokeless tobacco: Never   Tobacco comments:    FORMER SMOKER 30 YEARS AGO  Vaping Use   Vaping status: Never Used  Substance Use Topics   Alcohol use: Yes    Alcohol/week: 0.0 standard drinks of alcohol    Comment: OCCASIONALLY   Drug use: No    Family History  Problem Relation Age of Onset   Heart disease Father    Cancer Sister        lung, kidney   Cancer Brother        brain   Cancer Paternal Aunt        breast   Breast cancer Paternal Aunt    Cancer Brother        liver, lung   Osteoporosis Neg Hx     Allergies  Allergen Reactions   Daypro [Oxaprozin] Swelling    Face and tongue   Prolia [Denosumab]     unknown    Medication list has been reviewed and updated.  Current Outpatient Medications on File Prior to Visit  Medication Sig Dispense Refill   acetaminophen (TYLENOL) 500 MG tablet Take 2 tablets (1,000 mg total) by mouth 3 (three) times daily as needed for mild pain. (Patient taking differently: Take 1,000 mg by mouth daily as needed for mild pain (pain score 1-3) or moderate pain (pain score 4-6).)     amLODipine (NORVASC) 2.5 MG tablet Take 2.5 mg by mouth daily.     Calcium Carbonate-Vitamin D (CALCIUM + D PO) Take 1 capsule by mouth daily.      latanoprost (XALATAN) 0.005 % ophthalmic solution Place 1 drop into both eyes at bedtime.      levothyroxine (SYNTHROID) 75 MCG tablet TAKE 1 TABLET BY MOUTH DAILY BEFORE BREAKFAST 90 tablet 3   osimertinib mesylate (TAGRISSO) 40 MG tablet Take 40 mg by mouth at bedtime.     rosuvastatin (CRESTOR) 10 MG tablet Take 1 tablet (10 mg total) by mouth daily. TAKE 1 BY MOUTH DAILY 90 tablet 3   No current facility-administered  medications on file prior to visit.    Review of Systems:  As per HPI- otherwise negative.   Physical Examination: There were no vitals filed for this visit. There were no vitals filed for this  visit. There is no height or weight on file to calculate BMI. Ideal Body Weight:    GEN: no acute distress. HEENT: Atraumatic, Normocephalic.  Ears and Nose: No external deformity. CV: RRR, No M/G/R. No JVD. No thrill. No extra heart sounds. PULM: CTA B, no wheezes, crackles, rhonchi. No retractions. No resp. distress. No accessory muscle use. ABD: S, NT, ND, +BS. No rebound. No HSM. EXTR: No c/c/e PSYCH: Normally interactive. Conversant.    Assessment and Plan: ***  Signed Abbe Amsterdam, MD

## 2023-12-31 ENCOUNTER — Ambulatory Visit (INDEPENDENT_AMBULATORY_CARE_PROVIDER_SITE_OTHER): Admitting: Family Medicine

## 2023-12-31 VITALS — BP 132/82 | HR 72 | Temp 97.7°F | Resp 18 | Ht 64.0 in | Wt 105.2 lb

## 2023-12-31 DIAGNOSIS — I10 Essential (primary) hypertension: Secondary | ICD-10-CM

## 2023-12-31 DIAGNOSIS — C3491 Malignant neoplasm of unspecified part of right bronchus or lung: Secondary | ICD-10-CM | POA: Diagnosis not present

## 2023-12-31 DIAGNOSIS — E039 Hypothyroidism, unspecified: Secondary | ICD-10-CM | POA: Diagnosis not present

## 2024-01-25 ENCOUNTER — Telehealth: Payer: Self-pay | Admitting: Family Medicine

## 2024-01-25 NOTE — Telephone Encounter (Signed)
 Copied from CRM [id number]. Topic: Medicare AWV >> Jan 25, 2024  2:44 PM Juliana Ocean wrote: Reason for CRM: Called LVM 01/25/2024 to schedule AWV. Please schedule Virtual or Telehealth visits ONLY.   Rosalee Collins; Care Guide Ambulatory Clinical Support [place] [facility] Direct Dial: [phone]

## 2024-01-28 ENCOUNTER — Ambulatory Visit: Payer: Medicare Other | Admitting: Family Medicine

## 2024-02-01 ENCOUNTER — Other Ambulatory Visit: Payer: Self-pay | Admitting: Family Medicine

## 2024-02-01 MED ORDER — AMLODIPINE BESYLATE 2.5 MG PO TABS: 2.5000 mg | ORAL_TABLET | Freq: Every day | ORAL | 1 refills | Status: DC

## 2024-02-01 NOTE — Telephone Encounter (Signed)
 Copied from CRM [id number]. Topic: Clinical - Prescription Issue >> Feb 01, 2024 12:27 PM Chuck Crater wrote: Reason for CRM: Patient needs a new prescription sent to [facility] Order for  amLODipine  (NORVASC ) 2.5 MG tablet.

## 2024-02-01 NOTE — Telephone Encounter (Signed)
 Rx will not send electronically, rx faxed to [facility] mail order.

## 2024-02-03 DIAGNOSIS — Z79899 Other long term (current) drug therapy: Secondary | ICD-10-CM | POA: Diagnosis not present

## 2024-02-03 DIAGNOSIS — R918 Other nonspecific abnormal finding of lung field: Secondary | ICD-10-CM | POA: Diagnosis not present

## 2024-02-03 DIAGNOSIS — J90 Pleural effusion, not elsewhere classified: Secondary | ICD-10-CM | POA: Diagnosis not present

## 2024-02-03 DIAGNOSIS — C3491 Malignant neoplasm of unspecified part of right bronchus or lung: Secondary | ICD-10-CM | POA: Diagnosis not present

## 2024-02-03 DIAGNOSIS — I3139 Other pericardial effusion (noninflammatory): Secondary | ICD-10-CM | POA: Diagnosis not present

## 2024-02-03 DIAGNOSIS — C3431 Malignant neoplasm of lower lobe, right bronchus or lung: Secondary | ICD-10-CM | POA: Diagnosis not present

## 2024-02-10 DIAGNOSIS — I3139 Other pericardial effusion (noninflammatory): Secondary | ICD-10-CM | POA: Diagnosis not present

## 2024-02-10 DIAGNOSIS — I517 Cardiomegaly: Secondary | ICD-10-CM | POA: Diagnosis not present

## 2024-02-17 ENCOUNTER — Ambulatory Visit: Payer: Medicare Other | Admitting: Family Medicine

## 2024-02-17 ENCOUNTER — Ambulatory Visit: Admitting: Family Medicine

## 2024-02-17 ENCOUNTER — Ambulatory Visit

## 2024-02-17 VITALS — BP 156/84

## 2024-02-17 DIAGNOSIS — I10 Essential (primary) hypertension: Secondary | ICD-10-CM

## 2024-02-17 NOTE — Progress Notes (Signed)
 Patient came in today for a blood pressure check, however we are not sure who instructed her to come in for a blood pressure follow-up.  Her blood pressure has looked good the last few times I have seen her-I saw her most recently in March and asked her to come back in 3 to 4 months for follow-up Patient reports she first went to the wrong location by mistake, arrived at clinic fairly flustered and upset.  We had her rest for a while and her blood pressure did improve. She has been taking her medication- amlodipine  2.5 mg  BP Readings from Last 3 Encounters:  02/17/24 (!) 156/84  12/31/23 132/82  12/01/23 120/72   I was able to find a note from [facility] from April 30, it looks like the nurse practitioner she saw mentioned her blood pressure running high and that she might need to increase her dose of amlodipine   Called pt on 5/8 to check on her -patient explained she was instructed by her cardiologist to check her blood pressures at home, but she was not sure if her home cuff was accurate-this is how she ended up at clinic yesterday for blood pressure check.  She is feeling fine, we discussed her elevated blood pressure.  We decided to go ahead and increase her amlodipine  to 5 mg, I sent in a new prescription for her

## 2024-02-17 NOTE — Progress Notes (Signed)
 BP Readings from Last 3 Encounters:  12/31/23 132/82  12/01/23 120/72  11/16/23 134/78    Pt here for Blood pressure check per Dr Geralyn Knee  Pt currently takes: Amlodipine  2.5 mg Tablet   Pt reports compliance with medication.  BP today @ = 186/90 (L) Rechecked B/P 30 minutes later. It was 156/84 HR = 65  Pt advised per Dr. Geralyn Knee to follow up next month with appointment. Patient said Cardiology wanted her to follow up with PCP.

## 2024-02-18 ENCOUNTER — Other Ambulatory Visit: Payer: Self-pay | Admitting: Family Medicine

## 2024-02-18 DIAGNOSIS — I10 Essential (primary) hypertension: Secondary | ICD-10-CM

## 2024-02-18 MED ORDER — AMLODIPINE BESYLATE 5 MG PO TABS
5.0000 mg | ORAL_TABLET | Freq: Every day | ORAL | 3 refills | Status: DC
Start: 2024-02-18 — End: 2024-02-18

## 2024-02-18 MED ORDER — AMLODIPINE BESYLATE 5 MG PO TABS: 5.0000 mg | ORAL_TABLET | Freq: Every day | ORAL | 3 refills | Status: DC

## 2024-02-18 NOTE — Addendum Note (Signed)
 Addended by: Gates Kasal C on: 02/18/2024 01:18 PM   Modules accepted: Orders

## 2024-03-23 DIAGNOSIS — H00021 Hordeolum internum right upper eyelid: Secondary | ICD-10-CM | POA: Diagnosis not present

## 2024-03-28 NOTE — Progress Notes (Addendum)
  Healthcare at [facility] [address], [address] [place], [place] [address] [phone]  Date:  04/04/2024   Name:  [name]   DOB:  1930/10/17   MRN:  [id number]  PCP:  Watt Harlene BROCKS, MD    Chief Complaint: Follow-up   History of Present Illness:  [name] is a 89 y.o. very pleasant female patient who presents with the following:  Pt seen today for periodic recheck visit- I last saw her in March when she came in with concern of elevated BP- at that time I increased her dose of amlodipine  from 2.5 to 5 mg  In 2024 she was diagnosed with lung cancer found incidentally on imaging study for rib injury-she underwent lobectomy April 2024, recovery complicated by C. Difficile. Also, history of hypertension, hyperlipidemia, hypothyroidism, breast cancer, more recently some difficulty with paranoia and anxiety  She had thoracentesis for chronic effusion in March 2025- has not needed a repeat   She is able to walk about one hour a day with no CP or SOB She used to do more, but is pleased with this exercise ability   She had a chest CT 4/25:showed a significant pericardial effusion.  She then saw Cardiology with [facility] (Dr Hulon) at the end of April who did an echo for her CT chest dated 02/03/24:  MEDIASTINUM/HEART/VESSELS Unchanged moderate to large pericardial effusion. Moderate coronary arterial calcifications. Unchanged caliber of the thoracic aorta. The descending thoracic aorta is tortuous as on the prior exam. Unchanged rightward shift of the mediastinum. Unchanged mediastinal lymph nodes. 1.  Compared to 12/07/2023, stable postoperative appearance of the right lower lobectomy and right pleural effusion with apical and basilar components. 2.  Unchanged mixed attenuating left upper lobe nodule measuring 1.2 cm, concerning for an adenocarcinoma spectrum lesion. Recommend attention on follow-up. 3.  Scattered areas of mucous plugging  with a few new pulmonary nodules including a 4 mm left upper lobe pulmonary nodule. Findings may be infectious or inflammatory in the acute setting and can be reevaluated on follow-up imaging. 4.  Additional pulmonary nodules are unchanged including an unchanged right upper lobe subpleural nodule measuring 1.1 x 0.7 cm. 5.  Unchanged moderate to large pericardial effusion.   RSV and Shingrix done  Synthroid  76 Amlodipine  5 Tagrisso oral chemotherapy   Lab Results  Component Value Date   TSH 2.71 10/21/2023   Seen by oncology at [facility] at the end of April: ONCOLOGIC PROBLEM LIST:  1) lung adenocarcinoma - 11/21/22 p/w mech fall and acute chest wall pain with CT showing multiple nondisplaced fractures at OSH - 11/21/22 CT showing 1. Nondisplaced fractures of the anterolateral left 7th-9th ribs. No evidence of pneumothorax. 2. No acute traumatic findings in the abdomen or pelvis. Trace free fluid is seen in the right upper quadrant of the abdomen which is nonspecific given no evidence of solid organ injury. 3. Large irregular mass of the right lower lobe, increased in size when compared with prior CT and concerning for primary lung malignancy  - 12/10/22 CT guided biopsy yielding adenocarcinoma TTF1+ - 12/26/22 PETCT showing Hypermetabolic right lower lobe mass measuring up to 4.8 cm consistent with biopsy-proven malignancy. No evidence of metastatic disease.Indeterminate mildly FDG avid groundglass opacity in the left lung apex, which may represent low grade lung malignancy. - 01/20/23 RLL lobectomy yielding pT2bpN1 adenocarcinoma; margins neg, 4.7 cm tumor; EGFR econ 19 deletion.  - 4/20-4/30 rehospitalized with Cdiff - 04/02/23 started  osimertinib 80 mg daily. Held on 04/15/23 d/t rash, mucositis, eye irritation, weight loss.  - 04/29/23 resumed osi 40 mg daily - 05/05/24 held again d/t severe mucositis - 05/25/23 CT chest stable pulmonary fibrosis with peripheral reticulation suggestive UIP. Stable  RUL subpleural nodule and LUL nodule.  - 05/13/23 started osi 40 mg q 3 days - 06/23/23 started osi 40 mg daily.  - 08/31/23 CT chest stable post right lower lobectomy. Stable LUL nodule. Stable right lung subpleural nodules, radiology commented on mild increase c/w febraury, monitor. Stable pericardial effusion c/w August, increase c/w February scan. Stable basilar fibrotic lung disease.  - 1/21 - 11/06/23 admission for c-diff colitis. CT a/p noted increase now large pericardial effusion, ECHO EF >55%, no tamponade. Osimertinib held. - 12/16/23 thora, removed 200 ml, cytology negative. Transudate.  - 02/03/24 CT chest stable LUL nodule measuring 1.2 cm. Stable mod to large pericardial effusion. 2) breast cancer 2011   Patient Active Problem List   Diagnosis Date Noted   Lung cancer (HCC) 11/04/2023   Colitis due to Clostridium difficile 11/03/2023   Coronary artery calcification 06/11/2023   Malignant pericardial effusion 06/11/2023   Pancolitis (HCC) 01/30/2023   Atrial fibrillation (HCC) 01/30/2023   Hydropneumothorax 01/30/2023   Adenocarcinoma of lower lobe of right lung (HCC) 01/30/2023   Acute postoperative pain 01/21/2023   Status post partial lobectomy of lung 01/20/2023   Primary cancer of right lower lobe of lung (HCC) 01/20/2023   Rib fractures 11/21/2022   Right lower lobe lung mass 11/21/2022   Autoimmune disease (HCC) 07/31/2021   Barrett's esophagus 07/31/2021   Bone lesion 07/31/2021   Cervical cancer (HCC) 07/31/2021   Dysphagia 07/31/2021   Hypothyroidism 07/31/2021   Chronic midline thoracic back pain 12/31/2020   Paroxysmal SVT (supraventricular tachycardia) (HCC) 12/23/2020   Intracranial hemorrhage (HCC)    TBI (traumatic brain injury) (HCC) 05/04/2020   Back pain 05/04/2020   SAH (subarachnoid hemorrhage) (HCC) 04/29/2020   Orbital fracture, closed, initial encounter (HCC) 04/29/2020   Basal cell carcinoma (BCC) in situ of skin 04/04/2020   Essential  hypertension 03/21/2019   Systolic murmur 02/23/2019   Hypercalcemia    Dizziness 02/22/2019   Squamous cell carcinoma in situ of skin 11/30/2018   PVC (premature ventricular contraction) 09/08/2017   Glaucoma 09/08/2017   Hx of meningioma of the brain 08/28/2017   Laryngopharyngeal reflux 03/19/2016   Hoarseness of voice 02/28/2016   Raynaud's phenomenon 02/28/2016   Esophageal spasm 09/20/2013   Osteoporosis 03/18/2012   Humerus fracture 03/2012   Fracture, shoulder 03/06/2012   Hyperlipidemia    Breast cancer (HCC)     Past Medical History:  Diagnosis Date   Acute postoperative pain 01/21/2023   Adenocarcinoma of lower lobe of right lung (HCC) 01/30/2023   Atrial fibrillation (HCC) 01/30/2023   Autoimmune disease (HCC)    Back pain 05/04/2020   Barrett's esophagus    Basal cell carcinoma (BCC) in situ of skin 04/04/2020   Path 03/2020, [facility]    Bone lesion    sacrum   Breast cancer (HCC) 2010   cT1 N0 M0; ER+/ PR+/ HER2 neg; s/p lumpectomy 07/2010; started adjuvant hormonal therapy 10/2009   C. difficile diarrhea    Cervical cancer (HCC)    Chronic midline thoracic back pain 12/31/2020   Dizziness 02/22/2019   Dysphagia    Esophageal spasm 09/20/2013   Essential hypertension 03/21/2019   Fracture, shoulder 03/06/2012   Pt seen at [facility] by Dr. Prentice Pagan  on 03/09/2012 and 03/11/2012.   X-rays taken of left wrist, left elbow, and left shoulder.   Shoulder Fx: nondisplaced proximal humerus fracture involving the greater tuberosity. Wrist: consistent with an old distal radius fracture with shortening present.       Glaucoma    Hoarseness of voice 02/28/2016   Humerus fracture 03/2012   impacted left humueral neck fracture, treated by Dr. Camella   Hx of meningioma of the brain 08/28/2017   Stable since 2010- 2018 on MRI   Hydropneumothorax 01/30/2023   Hypercalcemia    Hyperlipidemia    Hypothyroidism    Intracranial hemorrhage (HCC)     Laryngopharyngeal reflux 03/19/2016   Orbital fracture, closed, initial encounter (HCC) 04/29/2020   Osteoporosis 03/18/2012   Pancolitis (HCC) 01/30/2023   Paroxysmal SVT (supraventricular tachycardia) (HCC) 12/23/2020   Primary cancer of right lower lobe of lung (HCC) 01/20/2023   PVC (premature ventricular contraction) 09/08/2017   Raynaud's phenomenon 02/28/2016   Rib fractures 11/21/2022   Right lower lobe lung mass 11/21/2022   SAH (subarachnoid hemorrhage) (HCC) 04/29/2020   Squamous cell carcinoma in situ of skin 11/30/2018   Path 11/1008, Dermatology managing    Stage 3a chronic kidney disease    Status post partial lobectomy of lung 01/20/2023   lower right lobe   Systolic murmur 02/23/2019   TBI (traumatic brain injury) (HCC) 05/04/2020    Past Surgical History:  Procedure Laterality Date   ABDOMINAL HYSTERECTOMY  1972   cancer          1 tube 1 ovary   APPENDECTOMY     BACK SURGERY  11/28/2020   BREAST LUMPECTOMY Left 2010   colon polyp removal     EYE SURGERY     HAND SURGERY     KYPHOPLASTY N/A 11/28/2020   Procedure: KYPHOPLASTY THORACIC TWELVE AND LUMBAR ONE;  Surgeon: Burnetta Aures, MD;  Location: [facility] OR;  Service: Orthopedics;  Laterality: N/A;  90 mins   LOBECTOMY Right 01/20/2023   right lower lobe   ortho procedures     multiple   OVARIAN CYST REMOVAL  1978   SHOULDER SURGERY  1954   left    Social History   Tobacco Use   Smoking status: Former    Current packs/day: 0.00    Types: Cigarettes    Quit date: 12/16/1986    Years since quitting: 37.3   Smokeless tobacco: Never   Tobacco comments:    FORMER SMOKER 30 YEARS AGO  Vaping Use   Vaping status: Never Used  Substance Use Topics   Alcohol use: Yes    Alcohol/week: 0.0 standard drinks of alcohol    Comment: OCCASIONALLY   Drug use: No    Family History  Problem Relation Age of Onset   Heart disease Father    Cancer Sister        lung, kidney   Cancer Brother        brain   Cancer  Paternal Aunt        breast   Breast cancer Paternal Aunt    Cancer Brother        liver, lung   Osteoporosis Neg Hx     Allergies  Allergen Reactions   Daypro [Oxaprozin] Swelling    Face and tongue   Prolia  [Denosumab ]     unknown    Medication list has been reviewed and updated.  Current Outpatient Medications on File Prior to Visit  Medication Sig Dispense Refill   acetaminophen  (TYLENOL )  500 MG tablet Take 2 tablets (1,000 mg total) by mouth 3 (three) times daily as needed for mild pain. (Patient taking differently: Take 1,000 mg by mouth daily as needed for mild pain (pain score 1-3) or moderate pain (pain score 4-6).)     amLODipine  (NORVASC ) 5 MG tablet Take 1 tablet (5 mg total) by mouth daily. 90 tablet 3   Calcium  Carbonate-Vitamin D  (CALCIUM  + D PO) Take 1 capsule by mouth daily.      latanoprost  (XALATAN ) 0.005 % ophthalmic solution Place 1 drop into both eyes at bedtime.      levothyroxine  (SYNTHROID ) 75 MCG tablet TAKE 1 TABLET BY MOUTH DAILY BEFORE BREAKFAST 90 tablet 3   osimertinib mesylate (TAGRISSO) 40 MG tablet Take 40 mg by mouth at bedtime. (Patient not taking: Reported on 04/04/2024)     No current facility-administered medications on file prior to visit.    Review of Systems:  As per HPI- otherwise negative.   Physical Examination: Vitals:   04/04/24 0838  BP: 136/84  Pulse: 70  SpO2: 96%   Vitals:   04/04/24 0838  Weight: 103 lb 3.2 oz (46.8 kg)  Height: 5' 4 (1.626 m)   Body mass index is 17.71 kg/m. Ideal Body Weight: Weight in (lb) to have BMI = 25: 145.3  GEN: no acute distress.  Petite build, looks well  HEENT: Atraumatic, Normocephalic.  Ears and Nose: No external deformity. CV: RRR, No M/G/R. No JVD. No thrill. No extra heart sounds. PULM: CTA B, no wheezes, crackles, rhonchi. No retractions. No resp. distress. No accessory muscle use. ABD: S, NT, ND EXTR: No c/c/e PSYCH: Normally interactive. Conversant.   Wt Readings from  Last 3 Encounters:  04/04/24 103 lb 3.2 oz (46.8 kg)  03/29/24 103 lb (46.7 kg)  12/31/23 105 lb 3.2 oz (47.7 kg)     Assessment and Plan: Essential hypertension  Acquired hypothyroidism - Plan: TSH  Malignant neoplasm of right lung, unspecified part of lung (HCC) Pt seen today for follow-up BP under good control Lung cancer is being treated by [facility].  She is also getting cardiology care at [facility] but may want to change over to [facility] for convenience She is not having sx of recurrent pulmonary effusion Check thyroid  today   Signed Harlene Schroeder, MD  Received her thyroid  testing, message to patient  Results for orders placed or performed in visit on 04/04/24  TSH   Collection Time: 04/04/24  9:09 AM  Result Value Ref Range   TSH 2.18 0.35 - 5.50 uIU/mL

## 2024-03-28 NOTE — Patient Instructions (Addendum)
 It was good to see you again today!  I will be in touch with your thyroid  level asap  Please see me in 4 - 6 months for a recheck visit If you would like me to facilitate getting back in with cardiology here at [facility] [address] just let me know

## 2024-03-29 ENCOUNTER — Ambulatory Visit (INDEPENDENT_AMBULATORY_CARE_PROVIDER_SITE_OTHER): Admitting: *Deleted

## 2024-03-29 VITALS — Ht 64.0 in | Wt 103.0 lb

## 2024-03-29 DIAGNOSIS — Z0000 Encounter for general adult medical examination without abnormal findings: Secondary | ICD-10-CM | POA: Diagnosis not present

## 2024-03-29 NOTE — Progress Notes (Signed)
 Please attest this visit in the absence of patient primary care provider.    Subjective:   [name] is a 89 y.o. who presents for a Medicare Wellness preventive visit.  As a reminder, Annual Wellness Visits don't include a physical exam, and some assessments may be limited, especially if this visit is performed virtually. We may recommend an in-person follow-up visit with your provider if needed.  Visit Complete: Virtual I connected with  [name] on 03/29/24 by a audio enabled telemedicine application and verified that I am speaking with the correct person using two identifiers.  Patient Location: Home  Provider Location: Office/Clinic  I discussed the limitations of evaluation and management by telemedicine. The patient expressed understanding and agreed to proceed.  Vital Signs: Because this visit was a virtual/telehealth visit, some criteria may be missing or patient reported. Any vitals not documented were not able to be obtained and vitals that have been documented are patient reported.  VideoDeclined- This patient declined Librarian, academic. Therefore the visit was completed with audio only.  Persons Participating in Visit: Patient.  AWV Questionnaire: No: Patient Medicare AWV questionnaire was not completed prior to this visit.  Cardiac Risk Factors include: advanced age (>44men, >46 women);hypertension;Other (see comment), Risk factor comments: AFIB, Coronary Athersclerosis, SVT     Objective:    Today's Vitals   03/29/24 0937  Weight: 103 lb (46.7 kg)  Height: 5' 4 (1.626 m)   Body mass index is 17.68 kg/m.     03/29/2024    9:47 AM 03/27/2023    3:34 PM 11/21/2022   12:12 PM 01/16/2022   10:23 AM 09/30/2021    6:41 PM 01/10/2021   11:04 AM 12/21/2020    8:03 AM  Advanced Directives  Does Patient Have a Medical Advance Directive? Yes Yes Yes Yes Yes No No  Type of Estate agent of [place];Living will  Healthcare Power of [place];Living will Healthcare Power of [place];Living will Healthcare Power of [place];Out of facility DNR (pink MOST or yellow form);Living will Healthcare Power of Attorney    Does patient want to make changes to medical advance directive? No - Patient declined  No - Patient declined  Yes (ED - Information included in AVS)    Copy of Healthcare Power of Attorney in Chart? Yes - validated most recent copy scanned in chart (See row information) Yes - validated most recent copy scanned in chart (See row information) No - copy requested Yes - validated most recent copy scanned in chart (See row information) No - copy requested    Would patient like information on creating a medical advance directive?      No - Patient declined No - Patient declined    Current Medications (verified) Outpatient Encounter Medications as of 03/29/2024  Medication Sig   acetaminophen  (TYLENOL ) 500 MG tablet Take 2 tablets (1,000 mg total) by mouth 3 (three) times daily as needed for mild pain. (Patient taking differently: Take 1,000 mg by mouth daily as needed for mild pain (pain score 1-3) or moderate pain (pain score 4-6).)   amLODipine  (NORVASC ) 5 MG tablet Take 1 tablet (5 mg total) by mouth daily.   Calcium  Carbonate-Vitamin D  (CALCIUM  + D PO) Take 1 capsule by mouth daily.    latanoprost  (XALATAN ) 0.005 % ophthalmic solution Place 1 drop into both eyes at bedtime.    levothyroxine  (SYNTHROID ) 75 MCG tablet TAKE 1 TABLET BY MOUTH DAILY BEFORE BREAKFAST   osimertinib mesylate (TAGRISSO)  40 MG tablet Take 40 mg by mouth at bedtime. (Patient not taking: Reported on 03/29/2024)   No facility-administered encounter medications on file as of 03/29/2024.    Allergies (verified) Daypro [oxaprozin] and Prolia  [denosumab ]   History: Past Medical History:  Diagnosis Date   Acute postoperative pain 01/21/2023   Adenocarcinoma of lower lobe of right lung (HCC) 01/30/2023   Atrial fibrillation (HCC)  01/30/2023   Autoimmune disease (HCC)    Back pain 05/04/2020   Barrett's esophagus    Basal cell carcinoma (BCC) in situ of skin 04/04/2020   Path 03/2020, [facility]    Bone lesion    sacrum   Breast cancer (HCC) 2010   cT1 N0 M0; ER+/ PR+/ HER2 neg; s/p lumpectomy 07/2010; started adjuvant hormonal therapy 10/2009   C. difficile diarrhea    Cervical cancer (HCC)    Chronic midline thoracic back pain 12/31/2020   Dizziness 02/22/2019   Dysphagia    Esophageal spasm 09/20/2013   Essential hypertension 03/21/2019   Fracture, shoulder 03/06/2012   Pt seen at [facility] [address] by Dr. Arvil Birks on 03/09/2012 and 03/11/2012.   X-rays taken of left wrist, left elbow, and left shoulder.   Shoulder Fx: nondisplaced proximal humerus fracture involving the greater tuberosity. Wrist: consistent with an old distal radius fracture with shortening present.       Glaucoma    Hoarseness of voice 02/28/2016   Humerus fracture 03/2012   impacted left humueral neck fracture, treated by Dr. Aloha Arnold   Hx of meningioma of the brain 08/28/2017   Stable since 2010- 2018 on MRI   Hydropneumothorax 01/30/2023   Hypercalcemia    Hyperlipidemia    Hypothyroidism    Intracranial hemorrhage (HCC)    Laryngopharyngeal reflux 03/19/2016   Orbital fracture, closed, initial encounter (HCC) 04/29/2020   Osteoporosis 03/18/2012   Pancolitis (HCC) 01/30/2023   Paroxysmal SVT (supraventricular tachycardia) (HCC) 12/23/2020   Primary cancer of right lower lobe of lung (HCC) 01/20/2023   PVC (premature ventricular contraction) 09/08/2017   Raynaud's phenomenon 02/28/2016   Rib fractures 11/21/2022   Right lower lobe lung mass 11/21/2022   SAH (subarachnoid hemorrhage) (HCC) 04/29/2020   Squamous cell carcinoma in situ of skin 11/30/2018   Path 11/1008, Dermatology managing    Stage 3a chronic kidney disease    Status post partial lobectomy of lung 01/20/2023   lower right lobe   Systolic murmur  19/14/7829   TBI (traumatic brain injury) (HCC) 05/04/2020   Past Surgical History:  Procedure Laterality Date   ABDOMINAL HYSTERECTOMY  1972   cancer          1 tube 1 ovary   APPENDECTOMY     BACK SURGERY  11/28/2020   BREAST LUMPECTOMY Left 2010   colon polyp removal     EYE SURGERY     HAND SURGERY     KYPHOPLASTY N/A 11/28/2020   Procedure: KYPHOPLASTY THORACIC TWELVE AND LUMBAR ONE;  Surgeon: Mort Ards, MD;  Location: [facility] OR;  Service: Orthopedics;  Laterality: N/A;  90 mins   LOBECTOMY Right 01/20/2023   right lower lobe   ortho procedures     multiple   OVARIAN CYST REMOVAL  1978   SHOULDER SURGERY  1954   left   Family History  Problem Relation Age of Onset   Heart disease Father    Cancer Sister        lung, kidney   Cancer Brother        brain  Cancer Paternal Aunt        breast   Breast cancer Paternal Aunt    Cancer Brother        liver, lung   Osteoporosis Neg Hx    Social History   Socioeconomic History   Marital status: Married    Spouse name: Not on file   Number of children: 2   Years of education: 12   Highest education level: Not on file  Occupational History   Occupation: retired  Tobacco Use   Smoking status: Former    Current packs/day: 0.00    Types: Cigarettes    Quit date: 12/16/1986    Years since quitting: 37.3   Smokeless tobacco: Never   Tobacco comments:    FORMER SMOKER 30 YEARS AGO  Vaping Use   Vaping status: Never Used  Substance and Sexual Activity   Alcohol use: Yes    Alcohol/week: 0.0 standard drinks of alcohol    Comment: OCCASIONALLY   Drug use: No   Sexual activity: Not on file  Other Topics Concern   Not on file  Social History Narrative   Married   Exercise: Yes   Patient lives with husband in a one story home.  Has 2 daughters.  Retired from working in Marine scientist.  Education: high school.     Right handed    Social Drivers of Health   Financial Resource Strain: Low Risk  (03/29/2024)   Overall  Financial Resource Strain (CARDIA)    Difficulty of Paying Living Expenses: Not hard at all  Food Insecurity: No Food Insecurity (03/29/2024)   Hunger Vital Sign    Worried About Running Out of Food in the Last Year: Never true    Ran Out of Food in the Last Year: Never true  Transportation Needs: No Transportation Needs (03/29/2024)   PRAPARE - Administrator, Civil Service (Medical): No    Lack of Transportation (Non-Medical): No  Physical Activity: Sufficiently Active (03/29/2024)   Exercise Vital Sign    Days of Exercise per Week: 7 days    Minutes of Exercise per Session: 30 min  Stress: No Stress Concern Present (03/29/2024)   [employer] of Occupational Health - Occupational Stress Questionnaire    Feeling of Stress: Not at all  Social Connections: Socially Integrated (03/29/2024)   Social Connection and Isolation Panel    Frequency of Communication with Friends and Family: More than three times a week    Frequency of Social Gatherings with Friends and Family: Twice a week    Attends Religious Services: More than 4 times per year    Active Member of [employer] or Organizations: Yes    Attends Engineer, structural: More than 4 times per year    Marital Status: Married    Tobacco Counseling Counseling given: Not Answered Tobacco comments: FORMER SMOKER 30 YEARS AGO    Clinical Intake:  Pre-visit preparation completed: Yes  Pain : No/denies pain     BMI - recorded: 17.68 (Eats all the time to keep weight up) Nutritional Status: BMI <19  Underweight Nutritional Risks: None Diabetes: No  Lab Results  Component Value Date   HGBA1C 6.1 07/31/2022     How often do you need to have someone help you when you read instructions, pamphlets, or other written materials from your doctor or pharmacy?: 1 - Never What is the last grade level you completed in school?: 12  Interpreter Needed?: No  Information entered by :: Susa Engman,  CMA   Activities of Daily Living     03/29/2024    9:50 AM  In your present state of health, do you have any difficulty performing the following activities:  Hearing? 0  Vision? 0  Difficulty concentrating or making decisions? 0  Walking or climbing stairs? 0  Dressing or bathing? 0  Doing errands, shopping? 0  Preparing Food and eating ? N  Using the Toilet? N  In the past six months, have you accidently leaked urine? N  Do you have problems with loss of bowel control? N  Managing your Medications? N  Managing your Finances? N  Housekeeping or managing your Housekeeping? N    Patient Care Team: Copland, Skipper Dumas, MD as PCP - General (Family Medicine) Sheryle Donning, MD as PCP - Cardiology (Cardiology) [place], Peter M, MD (Cardiology) Ronn Cohn, MD (Orthopedic Surgery) Christoper Crafts, MD (Dermatology) Patel, Donika K, DO as Consulting Physician (Neurology) Aminta Kales, MD as Consulting Physician (Ophthalmology)  I have updated your Care Teams any recent Medical Services you may have received from other providers in the past year.     Assessment:   This is a routine wellness examination for [place].  Hearing/Vision screen Hearing Screening - Comments:: Denies hearing difficulties.  Vision Screening - Comments:: Wears reading glasses -- up to date with routine eye exams.    Goals Addressed             This Visit's Progress    Maintain healthy active lifestyle.   On track      Depression Screen     03/29/2024    9:48 AM 03/27/2023    3:42 PM 12/03/2022    1:14 PM 01/29/2022    8:37 AM 01/16/2022   10:23 AM 09/30/2021    8:46 AM 04/16/2021    3:00 PM  PHQ 2/9 Scores  PHQ - 2 Score 0 0 0 0 0 0 0    Fall Risk     03/29/2024    9:48 AM 03/27/2023    3:39 PM 03/18/2023    2:28 PM 12/03/2022    1:13 PM 01/29/2022    8:36 AM  Fall Risk   Falls in the past year? 0 1 1 1  0  Comment  fell in bathroom and fractured 3 ribs     Number falls in past yr:  0 0 0 0 0  Injury with Fall? 0 1 1 1  0  Comment   broke ribs in february    Risk for fall due to : History of fall(s) History of fall(s) History of fall(s) History of fall(s)   Follow up Education provided Falls evaluation completed  Falls evaluation completed     MEDICARE RISK AT HOME:  Medicare Risk at Home Any stairs in or around the home?: Yes If so, are there any without handrails?: No Home free of loose throw rugs in walkways, pet beds, electrical cords, etc?: Yes Adequate lighting in your home to reduce risk of falls?: Yes Life alert?: No Use of a cane, walker or w/c?: No Grab bars in the bathroom?: Yes Shower chair or bench in shower?: Yes Elevated toilet seat or a handicapped toilet?: Yes  TIMED UP AND GO:  Was the test performed?  No, audio.  Cognitive Function: 6CIT completed        03/29/2024    9:56 AM 03/27/2023    3:49 PM 01/16/2022   10:27 AM  6CIT Screen  What Year? 0 points 0 points 0 points  What month? 0 points 0 points 0 points  What time? 0 points 0 points 0 points  Count back from 20 0 points 0 points 0 points  Months in reverse 0 points 0 points 0 points  Repeat phrase 0 points 0 points 4 points  Total Score 0 points 0 points 4 points    Immunizations Immunization History  Administered Date(s) Administered   Fluad Quad(high Dose 65+) 07/04/2019, 07/26/2020, 07/01/2021, 07/31/2022   Fluad Trivalent(High Dose 65+) 07/20/2023   Influenza Split 06/22/2012   Influenza, High Dose Seasonal PF 07/15/2016, 08/17/2017, 07/14/2018   Influenza,inj,Quad PF,6+ Mos 07/13/2013, 07/13/2014, 07/16/2015   Janssen (J&J) SARS-COV-2 Vaccination 01/16/2020   PFIZER(Purple Top)SARS-COV-2 Vaccination 09/09/2020   Pfizer Covid-19 Vaccine Bivalent Booster 83yrs & up 09/30/2021   Pfizer(Comirnaty )Fall Seasonal Vaccine 12 years and older 07/31/2022, 07/20/2023   Pneumococcal Conjugate-13 05/30/2014   Pneumococcal Polysaccharide-23 09/30/2021   Pneumococcal-Unspecified  05/13/2004   Respiratory Syncytial Virus Vaccine ,Recomb Aduvanted(Arexvy ) 11/16/2023   Td 03/02/2017   Zoster Recombinant(Shingrix) 08/03/2018, 10/02/2018    Screening Tests Health Maintenance  Topic Date Due   COVID-19 Vaccine (6 - 2024-25 season) 01/18/2024   Medicare Annual Wellness (AWV)  03/26/2024   INFLUENZA VACCINE  05/13/2024   DTaP/Tdap/Td (2 - Tdap) 03/03/2027   Pneumococcal Vaccine: 50+ Years  Completed   DEXA SCAN  Completed   Zoster Vaccines- Shingrix  Completed   HPV VACCINES  Aged Out   Meningococcal B Vaccine  Aged Out    Health Maintenance  Health Maintenance Due  Topic Date Due   COVID-19 Vaccine (6 - 2024-25 season) 01/18/2024   Medicare Annual Wellness (AWV)  03/26/2024   Health Maintenance Items Addressed: Will get COVID vaccine at local pharmacy. All other HM up to date.  Additional Screening:  Vision Screening: Recommended annual ophthalmology exams for early detection of glaucoma and other disorders of the eye. Would you like a referral to an eye doctor? No    Dental Screening: Recommended annual dental exams for proper oral hygiene  Community Resource Referral / Chronic Care Management: CRR required this visit?  No   CCM required this visit?  No   Plan:    I have personally reviewed and noted the following in the patient's chart:   Medical and social history Use of alcohol, tobacco or illicit drugs  Current medications and supplements including opioid prescriptions. Patient is not currently taking opioid prescriptions. Functional ability and status Nutritional status Physical activity Advanced directives List of other physicians Hospitalizations, surgeries, and ER visits in previous 12 months Vitals Screenings to include cognitive, depression, and falls Referrals and appointments  In addition, I have reviewed and discussed with patient certain preventive protocols, quality metrics, and best practice recommendations. A written  personalized care plan for preventive services as well as general preventive health recommendations were provided to patient.   Susa Engman, CMA   03/29/2024   After Visit Summary: (MyChart) Due to this being a telephonic visit, the after visit summary with patients personalized plan was offered to patient via MyChart   Notes:

## 2024-03-29 NOTE — Patient Instructions (Addendum)
 Ms. Crocker , Thank you for taking time out of your busy schedule to complete your Annual Wellness Visit with me. I enjoyed our conversation and look forward to speaking with you again next year. I, as well as your care team,  appreciate your ongoing commitment to your health goals. Please review the following plan we discussed and let me know if I can assist you in the future. Your Game plan/ To Do List   If your insurance doesn't automatically send you the gift card, you should call them or go on their portal and give them the date of your Wellness visit done today.  Follow up Visits: Next Medicare AWV with our clinical staff: 03/30/24 9:40am   Next Office Visit with your provider: 04/04/24 8:40  Clinician Recommendations:  Aim for 30 minutes of exercise or brisk walking, 6-8 glasses of water, and 5 servings of fruits and vegetables each day.       This is a list of the screening recommended for you and due dates:  Health Maintenance  Topic Date Due   COVID-19 Vaccine (6 - 2024-25 season) 01/18/2024   Medicare Annual Wellness Visit  03/26/2024   Flu Shot  05/13/2024   DTaP/Tdap/Td vaccine (2 - Tdap) 03/03/2027   Pneumococcal Vaccine for age over 85  Completed   DEXA scan (bone density measurement)  Completed   Zoster (Shingles) Vaccine  Completed   HPV Vaccine  Aged Out   Meningitis B Vaccine  Aged Out   See attachments for Preventive Care.

## 2024-04-04 ENCOUNTER — Encounter: Payer: Self-pay | Admitting: Family Medicine

## 2024-04-04 ENCOUNTER — Ambulatory Visit (INDEPENDENT_AMBULATORY_CARE_PROVIDER_SITE_OTHER): Admitting: Family Medicine

## 2024-04-04 VITALS — BP 136/84 | HR 70 | Ht 64.0 in | Wt 103.2 lb

## 2024-04-04 DIAGNOSIS — C3491 Malignant neoplasm of unspecified part of right bronchus or lung: Secondary | ICD-10-CM

## 2024-04-04 DIAGNOSIS — I10 Essential (primary) hypertension: Secondary | ICD-10-CM

## 2024-04-04 DIAGNOSIS — E039 Hypothyroidism, unspecified: Secondary | ICD-10-CM | POA: Diagnosis not present

## 2024-04-04 LAB — TSH: TSH: 2.18 u[IU]/mL (ref 0.35–5.50)

## 2024-04-07 DIAGNOSIS — H401132 Primary open-angle glaucoma, bilateral, moderate stage: Secondary | ICD-10-CM | POA: Diagnosis not present

## 2024-04-07 DIAGNOSIS — H4322 Crystalline deposits in vitreous body, left eye: Secondary | ICD-10-CM | POA: Diagnosis not present

## 2024-04-07 DIAGNOSIS — Z961 Presence of intraocular lens: Secondary | ICD-10-CM | POA: Diagnosis not present

## 2024-04-20 ENCOUNTER — Other Ambulatory Visit (HOSPITAL_BASED_OUTPATIENT_CLINIC_OR_DEPARTMENT_OTHER): Payer: Self-pay

## 2024-04-20 MED ORDER — COVID-19 MRNA VAC-TRIS(PFIZER) 30 MCG/0.3ML IM SUSY
0.3000 mL | PREFILLED_SYRINGE | Freq: Once | INTRAMUSCULAR | 0 refills | Status: AC
  Filled 2024-04-20: qty 0.3, 1d supply, fill #0

## 2024-04-28 DIAGNOSIS — L570 Actinic keratosis: Secondary | ICD-10-CM | POA: Diagnosis not present

## 2024-04-28 DIAGNOSIS — C44319 Basal cell carcinoma of skin of other parts of face: Secondary | ICD-10-CM | POA: Diagnosis not present

## 2024-05-04 DIAGNOSIS — J90 Pleural effusion, not elsewhere classified: Secondary | ICD-10-CM | POA: Diagnosis not present

## 2024-05-04 DIAGNOSIS — C3491 Malignant neoplasm of unspecified part of right bronchus or lung: Secondary | ICD-10-CM | POA: Diagnosis not present

## 2024-05-04 DIAGNOSIS — I3139 Other pericardial effusion (noninflammatory): Secondary | ICD-10-CM | POA: Diagnosis not present

## 2024-05-04 DIAGNOSIS — C3431 Malignant neoplasm of lower lobe, right bronchus or lung: Secondary | ICD-10-CM | POA: Diagnosis not present

## 2024-05-06 DIAGNOSIS — C3431 Malignant neoplasm of lower lobe, right bronchus or lung: Secondary | ICD-10-CM | POA: Diagnosis not present

## 2024-05-06 DIAGNOSIS — C3491 Malignant neoplasm of unspecified part of right bronchus or lung: Secondary | ICD-10-CM | POA: Diagnosis not present

## 2024-05-10 DIAGNOSIS — I371 Nonrheumatic pulmonary valve insufficiency: Secondary | ICD-10-CM | POA: Diagnosis not present

## 2024-05-10 DIAGNOSIS — R0789 Other chest pain: Secondary | ICD-10-CM | POA: Diagnosis not present

## 2024-05-10 DIAGNOSIS — R0609 Other forms of dyspnea: Secondary | ICD-10-CM | POA: Diagnosis not present

## 2024-05-10 DIAGNOSIS — Z87891 Personal history of nicotine dependence: Secondary | ICD-10-CM | POA: Diagnosis not present

## 2024-05-10 DIAGNOSIS — R9431 Abnormal electrocardiogram [ECG] [EKG]: Secondary | ICD-10-CM | POA: Diagnosis not present

## 2024-05-10 DIAGNOSIS — R42 Dizziness and giddiness: Secondary | ICD-10-CM | POA: Diagnosis not present

## 2024-05-10 DIAGNOSIS — I088 Other rheumatic multiple valve diseases: Secondary | ICD-10-CM | POA: Diagnosis not present

## 2024-05-10 DIAGNOSIS — Z79899 Other long term (current) drug therapy: Secondary | ICD-10-CM | POA: Diagnosis not present

## 2024-05-10 DIAGNOSIS — I071 Rheumatic tricuspid insufficiency: Secondary | ICD-10-CM | POA: Diagnosis not present

## 2024-05-10 DIAGNOSIS — I3139 Other pericardial effusion (noninflammatory): Secondary | ICD-10-CM | POA: Diagnosis not present

## 2024-05-10 DIAGNOSIS — I493 Ventricular premature depolarization: Secondary | ICD-10-CM | POA: Diagnosis not present

## 2024-05-20 DIAGNOSIS — K080 Exfoliation of teeth due to systemic causes: Secondary | ICD-10-CM | POA: Diagnosis not present

## 2024-05-24 DIAGNOSIS — R0789 Other chest pain: Secondary | ICD-10-CM | POA: Diagnosis not present

## 2024-05-24 DIAGNOSIS — E7849 Other hyperlipidemia: Secondary | ICD-10-CM | POA: Diagnosis not present

## 2024-05-24 DIAGNOSIS — R0609 Other forms of dyspnea: Secondary | ICD-10-CM | POA: Diagnosis not present

## 2024-05-24 DIAGNOSIS — I10 Essential (primary) hypertension: Secondary | ICD-10-CM | POA: Diagnosis not present

## 2024-05-24 DIAGNOSIS — I493 Ventricular premature depolarization: Secondary | ICD-10-CM | POA: Diagnosis not present

## 2024-06-02 DIAGNOSIS — R0789 Other chest pain: Secondary | ICD-10-CM | POA: Diagnosis not present

## 2024-06-02 DIAGNOSIS — R0609 Other forms of dyspnea: Secondary | ICD-10-CM | POA: Diagnosis not present

## 2024-07-27 DIAGNOSIS — I10 Essential (primary) hypertension: Secondary | ICD-10-CM | POA: Diagnosis not present

## 2024-07-27 DIAGNOSIS — I3139 Other pericardial effusion (noninflammatory): Secondary | ICD-10-CM | POA: Diagnosis not present

## 2024-08-02 NOTE — Progress Notes (Signed)
 [facility] [address], [address] [place], [place] [address] [phone]  Date:  08/08/2024   Name:  [name]   DOB:  09-Nov-1930   MRN:  [id number]  PCP:  Watt Harlene BROCKS, MD    Chief Complaint: Follow-up (I went to get in the car the other day and the door slung back and hit me in my back, its not sore today but it has been sore /Onset a couple of weeks ago /Flu shot )   History of Present Illness:  [name] is a 89 y.o. very pleasant female patient who presents with the following:  Patient seen today for periodic follow-up.  I saw her most recently in June In 2024 she was diagnosed with lung cancer found incidentally on imaging study for rib injury-she underwent lobectomy April 2024, recovery complicated by C. Difficile. Also history of hypertension, hyperlipidemia, hypothyroidism, breast cancer, more recently some difficulty with paranoia and anxiety  She had thoracentesis for chronic effusion in March 2025- has not needed a repeat   She also has shown a pericardial effusion-she has seen cardiology at [facility], Dr. Clarinda.  He saw her most recently at the end of July: 1. Feeling of chest tightness 2. DOE (dyspnea on exertion) History is concerning for angina. EKG in clinic today revealing multiple PVCs which may be related to ischemia but also could be related to irritation from pericardial fluid. Low suspicion that pericardial effusion is reponsible for her symptoms as it appears stable from prior and the symptoms have a distinctly anginal quality. Recent CT scan with severe coronary calcifications without prior ischemic workup so will pursue NM stress at this time. Considered LHC however with age and comorbidities will pursue invasive evaluation only if noninvasive suggests high risk. Will also check CMP and CBC for non cardiac etiologies of dyspnea.  - Lipid Panel W/Direct Low Density Lipoprotein (LDL) Cholesterol;  Future - CARD holter monitoring from 3 to 7 days - hook up; Future - ECG 12-lead with normal sinus rhythm and PVCs - NM myocardial perfusion SPECT multiple (stress and rest); Future- result was normal  - Complete Blood Count (CBC) with Differential; Future - Comprehensive Metabolic Panel (CMP); Future - Pro-Brain Natriuretic peptide, N-Terminal (NT-pro-BNP); Future - Lipid Panel W/Direct Low Density Lipoprotein (LDL) Cholesterol; Future SHE WAS SENT TO EVAL FOR PERICARIDIOCENTIS. HOWEVER SX APPEAR TO REFLECT ISCHEMIA MORE THAN TAMPONADE. ECHO DID NOT SUPPORT TAMPONADE EITHER. BNP NORMAL MAKING HF LESS LIKELY. CATH IS REASONABLE BUT STRESS TO EVAL RISK THEN CATH IF HIGH RISK BETTER APPROACH TO US . NO CHANGES IN MEDS CURRENTLY.  3. Pericardial effusion without cardiac tamponade (HHS-HCC) Echo reviewed in clinic today. Effusion appears stable without evidence of tamponade. IVC collapsible correlated with physical exam without JVD or lower extremity edema.  - no need for pericardial effusion tap at this time - pending results of stress test if LHC is pursued can consider RHC to evaluate hemodynamic impact of effusion further.  EFFUSION APPEARS SMALLER NOT LARGER. NO TAMPONADE. NOT AN ISSUE HERE 4. PVC (premature ventricular contraction) May be responsible for persistent feeling of lightheadedness. Is bradycardic by pulse likely secondary to frequent PVCs. Sinus HR is ~70-75 so may be role for low dose beta blocker but will eval first with stress test prior to medication  - NM stress as above  - possible low dose beta blocker trial in future  - 48 hour holter to be coordinated with  NM stress FREQUENT ON EXAM TODAY. HOLTER AFTER STRESS. BNP NORMAL SO NOT AFFECTING HEART MUCH. STILL COULD CONTRIBUTE TO SX  5. Lightheadedness Not orthostatic on exam today, BP 130/64 with standing. May be related to PVCs. Not in atrial fibrillation. Unlikely to be related to hemodynamic significance of pericardial  effusion.  - continue amlodipine  2.5 mg daily for now  - diagnostic plan as above.   Her primary cardiologist sent her for a consultation with electrophysiology earlier this month-they made a few suggestions regarding medication change which are to be reviewed by her primary cardiologist.  They did not recommend any intervention at this point She is seeing Dr Clarinda tomorrow and reports they plan to start either dilt or metoprolol    She saw her oncology team in April-also at [facility]  -Flu shot- give today  -Recommend COVID booster- this was done in July, she had a mild reaction and does not plan to do this again  -Mammogram can be updated in December -DEXA scan can also be updated this year She has completed Shingrix, RSV, pneumonia  Amlodipine  5 Levothyroxine  75  Discussed the use of AI scribe software for clinical note transcription with the patient, who gave verbal consent to proceed.  History of Present Illness [name] is a 89 year old female with heart palpitations who presents for follow-up after a nuclear stress test.  In late July, she was noted to have an irregular heart rhythm, with her heart rate dropping to 23 beats per minute, prompting a referral to an electrophysiologist. She underwent a nuclear stress test, which was normal, but she experienced adverse reactions to the medication used, including shaking and pain in her arms and legs, necessitating a counteractive shot. She does not plan a nuclear stress again!  She is currently on amlodipine  for blood pressure and thyroid  medication. She is cautious about starting new medications due to past adverse reactions, including a similar reaction after a COVID shot in July, which caused transient trembling.  She maintains a daily routine of walking for 45 minutes to an hour, which she enjoys, though she sometimes experiences tightness and back pain, requiring short breaks. She has not had any recent falls, but she did fall and  fracture her ribs last December.  She sometimes experiences mild dizziness upon standing. She is eating pretty well    Patient Active Problem List   Diagnosis Date Noted   Lung cancer (HCC) 11/04/2023   Colitis due to Clostridium difficile 11/03/2023   Coronary artery calcification 06/11/2023   Malignant pericardial effusion (HCC) 06/11/2023   Pancolitis 01/30/2023   Atrial fibrillation (HCC) 01/30/2023   Hydropneumothorax 01/30/2023   Adenocarcinoma of lower lobe of right lung (HCC) 01/30/2023   Acute postoperative pain 01/21/2023   Status post partial lobectomy of lung 01/20/2023   Primary cancer of right lower lobe of lung (HCC) 01/20/2023   Rib fractures 11/21/2022   Right lower lobe lung mass 11/21/2022   Autoimmune disease 07/31/2021   Barrett's esophagus 07/31/2021   Bone lesion 07/31/2021   Cervical cancer (HCC) 07/31/2021   Dysphagia 07/31/2021   Hypothyroidism 07/31/2021   Chronic midline thoracic back pain 12/31/2020   Paroxysmal SVT (supraventricular tachycardia) 12/23/2020   Intracranial hemorrhage (HCC)    TBI (traumatic brain injury) (HCC) 05/04/2020   Back pain 05/04/2020   SAH (subarachnoid hemorrhage) (HCC) 04/29/2020   Orbital fracture, closed, initial encounter (HCC) 04/29/2020   Basal cell carcinoma (BCC) in situ of skin 04/04/2020   Essential hypertension 03/21/2019  Systolic murmur 02/23/2019   Hypercalcemia    Dizziness 02/22/2019   Squamous cell carcinoma in situ of skin 11/30/2018   PVC (premature ventricular contraction) 09/08/2017   Glaucoma 09/08/2017   Hx of meningioma of the brain 08/28/2017   Laryngopharyngeal reflux 03/19/2016   Hoarseness of voice 02/28/2016   Raynaud's phenomenon 02/28/2016   Esophageal spasm 09/20/2013   Osteoporosis 03/18/2012   Humerus fracture 03/2012   Fracture, shoulder 03/06/2012   Hyperlipidemia    Breast cancer [facility])     Past Medical History:  Diagnosis Date   Acute postoperative pain 01/21/2023    Adenocarcinoma of lower lobe of right lung (HCC) 01/30/2023   Atrial fibrillation (HCC) 01/30/2023   Autoimmune disease    Back pain 05/04/2020   Barrett's esophagus    Basal cell carcinoma (BCC) in situ of skin 04/04/2020   Path 03/2020, [facility]    Bone lesion    sacrum   Breast cancer (HCC) 2010   cT1 N0 M0; ER+/ PR+/ HER2 neg; s/p lumpectomy 07/2010; started adjuvant hormonal therapy 10/2009   C. difficile diarrhea    Cervical cancer (HCC)    Chronic midline thoracic back pain 12/31/2020   Dizziness 02/22/2019   Dysphagia    Esophageal spasm 09/20/2013   Essential hypertension 03/21/2019   Fracture, shoulder 03/06/2012   Pt seen at [facility] by Dr. Prentice Pagan on 03/09/2012 and 03/11/2012.   X-rays taken of left wrist, left elbow, and left shoulder.   Shoulder Fx: nondisplaced proximal humerus fracture involving the greater tuberosity. Wrist: consistent with an old distal radius fracture with shortening present.       Glaucoma    Hoarseness of voice 02/28/2016   Humerus fracture 03/2012   impacted left humueral neck fracture, treated by Dr. Camella   Hx of meningioma of the brain 08/28/2017   Stable since 2010- 2018 on MRI   Hydropneumothorax 01/30/2023   Hypercalcemia    Hyperlipidemia    Hypothyroidism    Intracranial hemorrhage (HCC)    Laryngopharyngeal reflux 03/19/2016   Orbital fracture, closed, initial encounter (HCC) 04/29/2020   Osteoporosis 03/18/2012   Pancolitis 01/30/2023   Paroxysmal SVT (supraventricular tachycardia) 12/23/2020   Primary cancer of right lower lobe of lung (HCC) 01/20/2023   PVC (premature ventricular contraction) 09/08/2017   Raynaud's phenomenon 02/28/2016   Rib fractures 11/21/2022   Right lower lobe lung mass 11/21/2022   SAH (subarachnoid hemorrhage) (HCC) 04/29/2020   Squamous cell carcinoma in situ of skin 11/30/2018   Path 11/1008, Dermatology managing    Stage 3a chronic kidney disease    Status post partial  lobectomy of lung 01/20/2023   lower right lobe   Systolic murmur 02/23/2019   TBI (traumatic brain injury) (HCC) 05/04/2020    Past Surgical History:  Procedure Laterality Date   ABDOMINAL HYSTERECTOMY  1972   cancer          1 tube 1 ovary   APPENDECTOMY     BACK SURGERY  11/28/2020   BREAST LUMPECTOMY Left 2010   colon polyp removal     EYE SURGERY     HAND SURGERY     KYPHOPLASTY N/A 11/28/2020   Procedure: KYPHOPLASTY THORACIC TWELVE AND LUMBAR ONE;  Surgeon: Burnetta Aures, MD;  Location: [facility] OR;  Service: Orthopedics;  Laterality: N/A;  90 mins   LOBECTOMY Right 01/20/2023   right lower lobe   ortho procedures     multiple   OVARIAN CYST REMOVAL  1978   SHOULDER  SURGERY  1954   left    Social History   Tobacco Use   Smoking status: Former    Current packs/day: 0.00    Types: Cigarettes    Quit date: 12/16/1986    Years since quitting: 37.6   Smokeless tobacco: Never   Tobacco comments:    FORMER SMOKER 30 YEARS AGO  Vaping Use   Vaping status: Never Used  Substance Use Topics   Alcohol use: Yes    Alcohol/week: 0.0 standard drinks of alcohol    Comment: OCCASIONALLY   Drug use: No    Family History  Problem Relation Age of Onset   Heart disease Father    Cancer Sister        lung, kidney   Cancer Brother        brain   Cancer Paternal Aunt        breast   Breast cancer Paternal Aunt    Cancer Brother        liver, lung   Osteoporosis Neg Hx     Allergies  Allergen Reactions   Daypro [Oxaprozin] Swelling    Face and tongue   Prolia  [Denosumab ]     unknown    Medication list has been reviewed and updated.  Current Outpatient Medications on File Prior to Visit  Medication Sig Dispense Refill   acetaminophen  (TYLENOL ) 500 MG tablet Take 2 tablets (1,000 mg total) by mouth 3 (three) times daily as needed for mild pain. (Patient taking differently: Take 1,000 mg by mouth daily as needed for mild pain (pain score 1-3) or moderate pain (pain score  4-6).)     amLODipine  (NORVASC ) 5 MG tablet Take 1 tablet (5 mg total) by mouth daily. 90 tablet 3   Calcium  Carbonate-Vitamin D  (CALCIUM  + D PO) Take 1 capsule by mouth daily.      latanoprost  (XALATAN ) 0.005 % ophthalmic solution Place 1 drop into both eyes at bedtime.      levothyroxine  (SYNTHROID ) 75 MCG tablet TAKE 1 TABLET BY MOUTH DAILY BEFORE BREAKFAST 90 tablet 3   No current facility-administered medications on file prior to visit.    Review of Systems:  As per HPI- otherwise negative.   Physical Examination: Vitals:   08/08/24 0851  BP: (!) 140/82  Pulse: (!) 55  SpO2: 100%   Vitals:   08/08/24 0851  Weight: 103 lb (46.7 kg)  Height: 5' 4 (1.626 m)   Body mass index is 17.68 kg/m. Ideal Body Weight: Weight in (lb) to have BMI = 25: 145.3  GEN: no acute distress. Petite build looks well  HEENT: Atraumatic, Normocephalic.  Seb K under her left eye- pt reports she will see derm in December  Ears and Nose: No external deformity. CV: RRR, No M/G/R. No JVD. No thrill. No extra heart sounds. PULM: CTA B, no wheezes, crackles, rhonchi. No retractions. No resp. distress. No accessory muscle use. ABD: S, NT, ND, +BS. No rebound. No HSM. EXTR: No c/c/e PSYCH: Normally interactive. Conversant.   Wt Readings from Last 3 Encounters:  08/08/24 103 lb (46.7 kg)  04/04/24 103 lb 3.2 oz (46.8 kg)  03/29/24 103 lb (46.7 kg)    Assessment and Plan: Essential hypertension  Acquired hypothyroidism - Plan: TSH  Pericardial effusion  Encounter for screening mammogram for malignant neoplasm of breast - Plan: MM 3D SCREENING MAMMOGRAM BILATERAL BREAST  Estrogen deficiency - Plan: DG Bone Density  Mixed hyperlipidemia - Plan: Lipid panel  Prediabetes - Plan: Hemoglobin A1c  Assessment & Plan Cardiac Arrhythmia (under evaluation) Under cardiology evaluation. Normal nuclear stress test with adverse reaction noted.  - Follow up with cardiology tomorrow to discuss  potential new medications.  Dizziness possibly cardiac-related Intermittent dizziness possibly linked to cardiac arrhythmia.  Essential Hypertension Blood pressure managed with amlodipine . Awaiting potential medication changes post-cardiology follow-up. - Continue amlodipine  2.5 mg oral daily.  Acquired Hypothyroidism Thyroid  function managed with levothyroxine . No new symptoms. - Continue levothyroxine  75 mcg oral daily. - Check thyroid  function tests today.  General Health Maintenance Routine health maintenance ongoing. Active lifestyle. Hesitant about further COVID vaccine doses due to recent adverse reaction. - Order bone density scan. - Order mammogram. - Administer flu shot today. - Check cholesterol and A1c today.  Signed Harlene Schroeder, MD

## 2024-08-02 NOTE — Patient Instructions (Addendum)
 It was great to see you again today Flu shot today- I will be in touch with your labs Please see me in 6 months if all is well

## 2024-08-05 DIAGNOSIS — H5319 Other subjective visual disturbances: Secondary | ICD-10-CM | POA: Diagnosis not present

## 2024-08-05 DIAGNOSIS — H26493 Other secondary cataract, bilateral: Secondary | ICD-10-CM | POA: Diagnosis not present

## 2024-08-05 DIAGNOSIS — H401132 Primary open-angle glaucoma, bilateral, moderate stage: Secondary | ICD-10-CM | POA: Diagnosis not present

## 2024-08-05 DIAGNOSIS — Z961 Presence of intraocular lens: Secondary | ICD-10-CM | POA: Diagnosis not present

## 2024-08-08 ENCOUNTER — Ambulatory Visit: Admitting: Family Medicine

## 2024-08-08 ENCOUNTER — Encounter: Payer: Self-pay | Admitting: Family Medicine

## 2024-08-08 VITALS — BP 140/82 | HR 55 | Ht 64.0 in | Wt 103.0 lb

## 2024-08-08 DIAGNOSIS — Z23 Encounter for immunization: Secondary | ICD-10-CM

## 2024-08-08 DIAGNOSIS — Z1231 Encounter for screening mammogram for malignant neoplasm of breast: Secondary | ICD-10-CM | POA: Diagnosis not present

## 2024-08-08 DIAGNOSIS — I10 Essential (primary) hypertension: Secondary | ICD-10-CM | POA: Diagnosis not present

## 2024-08-08 DIAGNOSIS — E039 Hypothyroidism, unspecified: Secondary | ICD-10-CM | POA: Diagnosis not present

## 2024-08-08 DIAGNOSIS — E782 Mixed hyperlipidemia: Secondary | ICD-10-CM

## 2024-08-08 DIAGNOSIS — E2839 Other primary ovarian failure: Secondary | ICD-10-CM

## 2024-08-08 DIAGNOSIS — R7303 Prediabetes: Secondary | ICD-10-CM

## 2024-08-08 DIAGNOSIS — I3139 Other pericardial effusion (noninflammatory): Secondary | ICD-10-CM | POA: Diagnosis not present

## 2024-08-09 ENCOUNTER — Other Ambulatory Visit

## 2024-08-09 ENCOUNTER — Encounter: Payer: Self-pay | Admitting: Family Medicine

## 2024-08-09 DIAGNOSIS — E039 Hypothyroidism, unspecified: Secondary | ICD-10-CM

## 2024-08-09 DIAGNOSIS — R7303 Prediabetes: Secondary | ICD-10-CM

## 2024-08-09 DIAGNOSIS — E782 Mixed hyperlipidemia: Secondary | ICD-10-CM | POA: Diagnosis not present

## 2024-08-09 DIAGNOSIS — I10 Essential (primary) hypertension: Secondary | ICD-10-CM | POA: Diagnosis not present

## 2024-08-09 DIAGNOSIS — I493 Ventricular premature depolarization: Secondary | ICD-10-CM | POA: Diagnosis not present

## 2024-08-09 LAB — LIPID PANEL
Cholesterol: 236 mg/dL — ABNORMAL HIGH (ref 0–200)
HDL: 76.4 mg/dL (ref >39.00)
LDL Cholesterol: 138 mg/dL — ABNORMAL HIGH (ref 0–99)
NonHDL: 159.16
Total CHOL/HDL Ratio: 3
Triglycerides: 107 mg/dL (ref 0.0–149.0)
VLDL: 21.4 mg/dL (ref 0.0–40.0)

## 2024-08-09 LAB — HEMOGLOBIN A1C: Hgb A1c MFr Bld: 6 % (ref 4.6–6.5)

## 2024-08-09 LAB — TSH: TSH: 2.95 u[IU]/mL (ref 0.35–5.50)

## 2024-08-09 NOTE — Addendum Note (Signed)
 Addended by: TRUDY CURVIN RAMAN on: 08/09/2024 07:28 AM   Modules accepted: Orders

## 2024-09-21 DIAGNOSIS — C3431 Malignant neoplasm of lower lobe, right bronchus or lung: Secondary | ICD-10-CM | POA: Diagnosis not present

## 2024-09-21 DIAGNOSIS — J90 Pleural effusion, not elsewhere classified: Secondary | ICD-10-CM | POA: Diagnosis not present

## 2024-09-21 DIAGNOSIS — C3491 Malignant neoplasm of unspecified part of right bronchus or lung: Secondary | ICD-10-CM | POA: Diagnosis not present

## 2024-09-21 DIAGNOSIS — I3139 Other pericardial effusion (noninflammatory): Secondary | ICD-10-CM | POA: Diagnosis not present

## 2024-09-21 DIAGNOSIS — R918 Other nonspecific abnormal finding of lung field: Secondary | ICD-10-CM | POA: Diagnosis not present

## 2024-09-21 DIAGNOSIS — R911 Solitary pulmonary nodule: Secondary | ICD-10-CM | POA: Diagnosis not present

## 2024-09-24 DIAGNOSIS — R42 Dizziness and giddiness: Secondary | ICD-10-CM | POA: Diagnosis not present

## 2024-09-24 DIAGNOSIS — C3491 Malignant neoplasm of unspecified part of right bronchus or lung: Secondary | ICD-10-CM | POA: Diagnosis not present

## 2024-09-24 DIAGNOSIS — Z85118 Personal history of other malignant neoplasm of bronchus and lung: Secondary | ICD-10-CM | POA: Diagnosis not present

## 2024-10-03 ENCOUNTER — Encounter (HOSPITAL_BASED_OUTPATIENT_CLINIC_OR_DEPARTMENT_OTHER): Payer: Self-pay

## 2024-10-03 ENCOUNTER — Inpatient Hospital Stay (HOSPITAL_BASED_OUTPATIENT_CLINIC_OR_DEPARTMENT_OTHER): Admission: RE | Admit: 2024-10-03 | Discharge: 2024-10-03 | Attending: Family Medicine | Admitting: Family Medicine

## 2024-10-03 ENCOUNTER — Ambulatory Visit (HOSPITAL_BASED_OUTPATIENT_CLINIC_OR_DEPARTMENT_OTHER)
Admission: RE | Admit: 2024-10-03 | Discharge: 2024-10-03 | Disposition: A | Discharge status: Home / Self Care | Source: Ambulatory Visit | Attending: Family Medicine | Admitting: Family Medicine

## 2024-10-03 DIAGNOSIS — E2839 Other primary ovarian failure: Secondary | ICD-10-CM | POA: Diagnosis present

## 2024-10-03 DIAGNOSIS — Z1231 Encounter for screening mammogram for malignant neoplasm of breast: Secondary | ICD-10-CM | POA: Insufficient documentation

## 2024-10-04 ENCOUNTER — Encounter: Payer: Self-pay | Admitting: Family Medicine

## 2024-10-17 ENCOUNTER — Telehealth: Payer: Self-pay

## 2024-10-17 ENCOUNTER — Ambulatory Visit (INDEPENDENT_AMBULATORY_CARE_PROVIDER_SITE_OTHER): Admitting: Family Medicine

## 2024-10-17 ENCOUNTER — Ambulatory Visit: Payer: Self-pay | Admitting: Family Medicine

## 2024-10-17 ENCOUNTER — Encounter: Payer: Self-pay | Admitting: Family Medicine

## 2024-10-17 ENCOUNTER — Other Ambulatory Visit: Payer: Self-pay

## 2024-10-17 ENCOUNTER — Ambulatory Visit (HOSPITAL_BASED_OUTPATIENT_CLINIC_OR_DEPARTMENT_OTHER)
Admission: RE | Admit: 2024-10-17 | Discharge: 2024-10-17 | Disposition: A | Discharge status: Home / Self Care | Source: Ambulatory Visit | Attending: Family Medicine | Admitting: Family Medicine

## 2024-10-17 VITALS — BP 132/64 | HR 58 | Temp 98.0°F | Resp 16 | Ht 64.0 in | Wt 105.6 lb

## 2024-10-17 DIAGNOSIS — R0602 Shortness of breath: Secondary | ICD-10-CM | POA: Diagnosis not present

## 2024-10-17 DIAGNOSIS — R42 Dizziness and giddiness: Secondary | ICD-10-CM

## 2024-10-17 DIAGNOSIS — I4891 Unspecified atrial fibrillation: Secondary | ICD-10-CM

## 2024-10-17 DIAGNOSIS — I10 Essential (primary) hypertension: Secondary | ICD-10-CM | POA: Diagnosis not present

## 2024-10-17 DIAGNOSIS — E876 Hypokalemia: Secondary | ICD-10-CM

## 2024-10-17 LAB — COMPREHENSIVE METABOLIC PANEL WITH GFR
ALT: 17 U/L (ref 3–35)
AST: 22 U/L (ref 5–37)
Albumin: 4.2 g/dL (ref 3.5–5.2)
Alkaline Phosphatase: 131 U/L — ABNORMAL HIGH (ref 39–117)
BUN: 21 mg/dL (ref 6–23)
CO2: 27 meq/L (ref 19–32)
Calcium: 9.9 mg/dL (ref 8.4–10.5)
Chloride: 103 meq/L (ref 96–112)
Creatinine, Ser: 0.94 mg/dL (ref 0.40–1.20)
GFR: 53.04 mL/min — ABNORMAL LOW
Glucose, Bld: 111 mg/dL — ABNORMAL HIGH (ref 70–99)
Potassium: 5.3 meq/L — ABNORMAL HIGH (ref 3.5–5.1)
Sodium: 139 meq/L (ref 135–145)
Total Bilirubin: 0.5 mg/dL (ref 0.2–1.2)
Total Protein: 7.2 g/dL (ref 6.0–8.3)

## 2024-10-17 LAB — CBC
HCT: 39.9 % (ref 36.0–46.0)
Hemoglobin: 13.1 g/dL (ref 12.0–15.0)
MCHC: 32.8 g/dL (ref 30.0–36.0)
MCV: 93.8 fl (ref 78.0–100.0)
Platelets: 278 K/uL (ref 150.0–400.0)
RBC: 4.25 Mil/uL (ref 3.87–5.11)
RDW: 16.7 % — ABNORMAL HIGH (ref 11.5–15.5)
WBC: 8.9 K/uL (ref 4.0–10.5)

## 2024-10-17 LAB — MAGNESIUM: Magnesium: 1.8 mg/dL (ref 1.5–2.5)

## 2024-10-17 NOTE — Progress Notes (Signed)
 Chief Complaint  Patient presents with   Hypertension    BP    Subjective [name] is a 89 y.o. female who presents for hypertension follow up. She does monitor home blood pressures. Blood pressures ranging from 160-170's/70's on average. She is compliant with medication- Norvasc  2.5 mg bid. Patient has these side effects of medication: none She is usually adhering to a healthy diet overall. Current exercise: walking No CP, diarrhea, bleeding, pain in her calves, swelling, N/V, fevers. +SOB that started yesterday.  She has intermittently and will sometimes feel there is thickening in her airway.  She will cough/jolt and feels it is cleared away.  She denies recent illness or close contacts were been sick.  Of note, the patient has intermittent lightheadedness.  She has been eating and drinking normally.  When she stands up, she will sometimes get lightheaded.  After she got back from the lab, she had no current symptoms though she was sitting in a wheelchair.  When she went to the lab, she stood up and got very lightheaded compared to her baseline.   Past Medical History:  Diagnosis Date   Acute postoperative pain 01/21/2023   Adenocarcinoma of lower lobe of right lung (HCC) 01/30/2023   Atrial fibrillation (HCC) 01/30/2023   Autoimmune disease    Back pain 05/04/2020   Barrett's esophagus    Basal cell carcinoma (BCC) in situ of skin 04/04/2020   Path 03/2020, [facility]    Bone lesion    sacrum   Breast cancer (HCC) 2010   cT1 N0 M0; ER+/ PR+/ HER2 neg; s/p lumpectomy 07/2010; started adjuvant hormonal therapy 10/2009   C. difficile diarrhea    Cervical cancer (HCC)    Chronic midline thoracic back pain 12/31/2020   Dizziness 02/22/2019   Dysphagia    Esophageal spasm 09/20/2013   Essential hypertension 03/21/2019   Fracture, shoulder 03/06/2012   Pt seen at [facility] by Dr. Prentice Pagan on 03/09/2012 and 03/11/2012.   X-rays taken of left wrist, left  elbow, and left shoulder.   Shoulder Fx: nondisplaced proximal humerus fracture involving the greater tuberosity. Wrist: consistent with an old distal radius fracture with shortening present.       Glaucoma    Hoarseness of voice 02/28/2016   Humerus fracture 03/2012   impacted left humueral neck fracture, treated by Dr. Camella   Hx of meningioma of the brain 08/28/2017   Stable since 2010- 2018 on MRI   Hydropneumothorax 01/30/2023   Hypercalcemia    Hyperlipidemia    Hypothyroidism    Intracranial hemorrhage (HCC)    Laryngopharyngeal reflux 03/19/2016   Orbital fracture, closed, initial encounter (HCC) 04/29/2020   Osteoporosis 03/18/2012   Pancolitis 01/30/2023   Paroxysmal SVT (supraventricular tachycardia) 12/23/2020   Primary cancer of right lower lobe of lung (HCC) 01/20/2023   PVC (premature ventricular contraction) 09/08/2017   Raynaud's phenomenon 02/28/2016   Rib fractures 11/21/2022   Right lower lobe lung mass 11/21/2022   SAH (subarachnoid hemorrhage) (HCC) 04/29/2020   Squamous cell carcinoma in situ of skin 11/30/2018   Path 11/1008, Dermatology managing    Stage 3a chronic kidney disease    Status post partial lobectomy of lung 01/20/2023   lower right lobe   Systolic murmur 02/23/2019   TBI (traumatic brain injury) (HCC) 05/04/2020    Exam BP 132/64 (BP Location: Left Arm, Patient Position: Sitting)   Pulse (!) 58   Temp 98 F (36.7 C) (Oral)   Resp 16  Ht 5' 4 (1.626 m)   Wt 105 lb 9.6 oz (47.9 kg)   SpO2 99%   BMI 18.13 kg/m  General:  well developed, well nourished, in no apparent distress Heart: Regular rate, irregular rhythm, no bruits, no LE edema Lungs: clear to auscultation, no accessory muscle use MSK: No calf TTP Psych: well oriented with normal range of affect and appropriate judgment/insight  Shortness of breath - Plan: CBC, Comprehensive metabolic panel with GFR, Magnesium , DG Chest 2 View  Atrial fibrillation, unspecified type (HCC)  - Plan: EKG 12-Lead  Essential hypertension  Lightheadedness  She went to get blood work and returned to the room.  Shortness of breath had resolved.  Recommended checking a chest x-ray and labs in addition to Mucinex 600 mg twice daily to see if it helps with congestion buildup.  Low risk for a PE. EKG shows normal sinus bradycardia with a PVC.  No signs of ischemia or interval abnormality otherwise. Chronic issue that is relatively controlled currently.  Continue to monitor blood pressure at home.  She will continue Norvasc  2.5 mg twice daily for now. Slight exacerbation of chronic issue.  Stay hydrated.  Check labs.  Check a chest x-ray.  When she goes down for her chest x-ray, if she has another episode that is more severe than usual, she will go to the emergency department.  If things have resolved with this as well, she can go home for now with precautions to seek immediate care if things worsen again. The patient voiced understanding and agreement to the plan.  I spent 45 minutes with the patient discussing the above plans in addition to reviewing her chart on the same day of the visit.  Mabel Mt [place], DO 10/17/2024  12:42 PM

## 2024-10-17 NOTE — Patient Instructions (Addendum)
 Give us  2-3 business days to get the results of your labs back.   Stay hydrated.   Consider taking Mucinex 600 mg twice daily to help with congestion to see if it helped.   Keep an eye on your blood pressure.   Let us  know if you need anything.

## 2024-10-17 NOTE — Telephone Encounter (Signed)
 Copied from CRM [id number]. Topic: Clinical - Request for Lab/Test Order >> Oct 17, 2024  4:44 PM Wess RAMAN wrote: Reason for CRM: Patient's daughter would like to know CT Scan results.  Callback #: [id number]

## 2024-10-17 NOTE — Telephone Encounter (Signed)
 Called pt per Dr.Wendling regarding DG Chest 2 View  and pt said has appointment on 11/10/23 but will contact  them for sooner appt. Called pt daughter Lvm per pt request to let daughter know, and pt stated understand.

## 2024-10-17 NOTE — Telephone Encounter (Signed)
 We didn't order a CT scan, it was a chest x-ray. Unofficial read shows some fluid in the R lower lung field, on closer review it does seem similar as last X-ray. If we were still to be having shortness of breath- even intermittent bouts of it, I want her to see a pulmonologist, not necessarily moving up her other specialty appointments. Thx.

## 2024-10-17 NOTE — Telephone Encounter (Unsigned)
 Copied from CRM [id number]. Topic: Clinical - Request for Lab/Test Order >> Oct 17, 2024  4:44 PM Wess RAMAN wrote: Reason for CRM: Patient's daughter would like to know CT Scan results.  Callback #: [id number]

## 2024-10-19 ENCOUNTER — Encounter: Payer: Self-pay | Admitting: Family Medicine

## 2024-10-19 DIAGNOSIS — E039 Hypothyroidism, unspecified: Secondary | ICD-10-CM

## 2024-10-21 ENCOUNTER — Other Ambulatory Visit

## 2024-10-24 ENCOUNTER — Other Ambulatory Visit: Payer: Self-pay | Admitting: Family Medicine

## 2024-10-24 DIAGNOSIS — E039 Hypothyroidism, unspecified: Secondary | ICD-10-CM

## 2024-10-24 DIAGNOSIS — I10 Essential (primary) hypertension: Secondary | ICD-10-CM

## 2024-10-24 MED ORDER — AMLODIPINE BESYLATE 5 MG PO TABS: 5.0000 mg | ORAL_TABLET | Freq: Every day | ORAL | 3 refills | Status: AC

## 2024-10-24 MED ORDER — LEVOTHYROXINE SODIUM 75 MCG PO TABS: ORAL_TABLET | ORAL | 3 refills | Status: AC

## 2024-10-24 NOTE — Telephone Encounter (Signed)
 Copied from CRM [id number]. Topic: Clinical - Medication Refill >> Oct 24, 2024  9:11 AM Lonell PEDLAR wrote: Medication:  amLODipine  (NORVASC ) 5 MG tablet - 90 day supply levothyroxine  (SYNTHROID ) 75 MCG tablet - 90 day supply  Has the patient contacted their pharmacy? Yes, pt requires new script (Agent: If no, request that the patient contact the pharmacy for the refill. If patient does not wish to contact the pharmacy document the reason why and proceed with request.) (Agent: If yes, when and what did the pharmacy advise?)  This is the patient's preferred pharmacy:   [facility] [place], [place] - [address] [place] [id number] Phone: [phone] Fax: [phone]   Is this the correct pharmacy for this prescription? Yes If no, delete pharmacy and type the correct one.   Has the prescription been filled recently? Yes  Is the patient out of the medication? Yes  Has the patient been seen for an appointment in the last year OR does the patient have an upcoming appointment? Yes  Can we respond through MyChart? No  Agent: Please be advised that Rx refills may take up to 3 business days. We ask that you follow-up with your pharmacy.

## 2025-03-30 ENCOUNTER — Ambulatory Visit
